# Patient Record
Sex: Female | Born: 1970 | Race: Black or African American | Hispanic: No | Marital: Single | Smoking: Never smoker
Health system: Southern US, Community
[De-identification: ages and names within clinical notes are randomized; demographics above are authoritative.]

## PROBLEM LIST (undated history)

## (undated) ENCOUNTER — Emergency Department (HOSPITAL_COMMUNITY): Payer: Medicaid Other | Source: Home / Self Care

## (undated) DIAGNOSIS — F32A Depression, unspecified: Secondary | ICD-10-CM

## (undated) DIAGNOSIS — E785 Hyperlipidemia, unspecified: Secondary | ICD-10-CM

## (undated) DIAGNOSIS — R4781 Slurred speech: Secondary | ICD-10-CM

## (undated) DIAGNOSIS — R252 Cramp and spasm: Secondary | ICD-10-CM

## (undated) DIAGNOSIS — K589 Irritable bowel syndrome without diarrhea: Secondary | ICD-10-CM

## (undated) DIAGNOSIS — E119 Type 2 diabetes mellitus without complications: Secondary | ICD-10-CM

## (undated) DIAGNOSIS — F419 Anxiety disorder, unspecified: Secondary | ICD-10-CM

## (undated) DIAGNOSIS — I639 Cerebral infarction, unspecified: Secondary | ICD-10-CM

## (undated) DIAGNOSIS — J45909 Unspecified asthma, uncomplicated: Secondary | ICD-10-CM

## (undated) DIAGNOSIS — F329 Major depressive disorder, single episode, unspecified: Secondary | ICD-10-CM

## (undated) HISTORY — DX: Hyperlipidemia, unspecified: E78.5

## (undated) HISTORY — PX: TOE SURGERY: SHX1073

## (undated) HISTORY — DX: Unspecified asthma, uncomplicated: J45.909

## (undated) HISTORY — DX: Slurred speech: R47.81

## (undated) HISTORY — DX: Cerebral infarction, unspecified: I63.9

## (undated) HISTORY — DX: Cramp and spasm: R25.2

## (undated) HISTORY — DX: Major depressive disorder, single episode, unspecified: F32.9

## (undated) HISTORY — DX: Anxiety disorder, unspecified: F41.9

## (undated) HISTORY — DX: Depression, unspecified: F32.A

## (undated) HISTORY — DX: Irritable bowel syndrome, unspecified: K58.9

---

## 2006-10-30 HISTORY — PX: ABDOMINAL HYSTERECTOMY: SHX81

## 2006-10-30 HISTORY — PX: OTHER SURGICAL HISTORY: SHX169

## 2013-10-01 ENCOUNTER — Emergency Department: Payer: Self-pay | Admitting: Emergency Medicine

## 2013-10-02 ENCOUNTER — Emergency Department: Payer: Self-pay | Admitting: Emergency Medicine

## 2014-09-27 ENCOUNTER — Ambulatory Visit (INDEPENDENT_AMBULATORY_CARE_PROVIDER_SITE_OTHER): Payer: No Typology Code available for payment source | Admitting: Primary Care

## 2014-09-27 ENCOUNTER — Encounter: Payer: Self-pay | Admitting: Primary Care

## 2014-09-27 VITALS — BP 128/88 | HR 55 | Temp 97.8°F | Ht 63.0 in | Wt 165.8 lb

## 2014-09-27 DIAGNOSIS — E785 Hyperlipidemia, unspecified: Secondary | ICD-10-CM

## 2014-09-27 DIAGNOSIS — G4700 Insomnia, unspecified: Secondary | ICD-10-CM | POA: Diagnosis not present

## 2014-09-27 DIAGNOSIS — R030 Elevated blood-pressure reading, without diagnosis of hypertension: Secondary | ICD-10-CM

## 2014-09-27 DIAGNOSIS — R079 Chest pain, unspecified: Secondary | ICD-10-CM | POA: Diagnosis not present

## 2014-09-27 DIAGNOSIS — K589 Irritable bowel syndrome without diarrhea: Secondary | ICD-10-CM | POA: Diagnosis not present

## 2014-09-27 MED ORDER — TRAZODONE HCL 50 MG PO TABS: ORAL_TABLET | ORAL | Status: DC

## 2014-09-27 NOTE — Progress Notes (Signed)
Pre visit review using our clinic review tool, if applicable. No additional management support is needed unless otherwise documented below in the visit note. 

## 2014-09-27 NOTE — Progress Notes (Signed)
Subjective:    Patient ID: Amber [name], female    DOB: Apr 25, 1971, 44 y.o.   MRN: [id number]  HPI  [name] is a 44 year old female who presents today to establish care and discuss the problems mentioned below. Will obtain old records.  1) Depression: History of. She staring seeing therapist in 2011 after losing her job. She feels it was very helpful in recovery and has felt well since. Denies SI/HI  2) Overweight: She realizes she is over weight and has started cutting back her soda intake. She is incorporating crystal light and water, and is down to 2 mini soda cans of mountain dew daily. Her diet is unhealthy and consists of fast food ([employer]), and eating out. She is currently not exercising.   Body mass index is 29.38 kg/(m^2).  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 She feels anxious about some of her patient cases which will prevent her from sleeping. She has a history of hair loss in the past. She denies worrying on a daily basis and does not feel anxious overall. She's been taking Melatonin 6 mg tablets for one month every night at bedtime and has lately been unable to fall asleep and stay asleep. Tries to get into the bed between 12:30am-1am and will wake up at 6:15am. She will fall asleep from 7:45am to 10:30am. Then will get home between 8:30-9pm. She's able to function. Denies irritability and anxiety symptoms. She's tried taking benadryl with help but doesn't like taking it routinely.  4) IBS (constipation): Present since hysterectomy. She had to endure a bowel resection due to severe endometriosis and had been following with GI regularily in [place]. She has not been re-evluated in several years and is requesting for referral to GI.  She's started taking stool softeners with some help. Diet does not consist of regular fiber intake.  5) High Blood Pressure: Elevated reading in Februrary 2016  at her GYN office. Dr. Elane Fritz at [facility]. She expereinced pre-eclampsia during labor with her daughter. She has not checked her BP since February 2016. Denies headaches today but will get them intermittently. She does not regularly check her BP.  BP Readings from Last 3 Encounters:  09/27/14 128/88    6) Hyperlipidemia: Measured in February 2016 LDL 162. Diet consists of fast food and eating out. She will cook some meals at home but does not include many fruits, vegetables, or sources of fiber.  7) Chest pain: Intermittent, located under left breast and left lateral chest wall. This has been present for about 6 months to one year. Now occuring more frequently. Tums will help occasionally. She describes her pain as tight and sharp that will last 3 minutes in duration. Recent labs completed in February. She does not believe she's ever had an ECG. Denies radiation of pain to arm or neck, diaphoresis, nausea/vomiting. Last occurrence of pain was several days ago.  Review of Systems  Constitutional: Negative for diaphoresis and unexpected weight change.  HENT: Negative for rhinorrhea.   Respiratory: Negative for cough and shortness of breath.   Cardiovascular: Positive for chest pain. Negative for palpitations.       See HPI  Gastrointestinal: Positive for constipation. Negative for nausea, vomiting and blood in stool.  Genitourinary: Negative for dysuria and frequency.  Musculoskeletal: Negative for myalgias and arthralgias.  Skin: Negative for rash.  Allergic/Immunologic: Positive for environmental allergies.  Neurological: Negative for dizziness  and headaches.  Hematological: Negative for adenopathy.  Psychiatric/Behavioral: Positive for sleep disturbance and agitation.       Past Medical History  Diagnosis Date  . Asthma   . Depression   . Headache   . Hyperlipidemia   . Hypertension   . UTI (lower urinary tract infection)   . IBS (irritable bowel syndrome)     History    Social History  . Marital Status: Married    Spouse Name: N/A  . Number of Children: N/A  . Years of Education: N/A   Occupational History  . Not on file.   Social History Main Topics  . Smoking status: Never Smoker   . Smokeless tobacco: Not on file  . Alcohol Use: No  . Drug Use: No  . Sexual Activity: Not on file   Other Topics Concern  . Not on file   Social History Narrative   Originally from [place]   Family lives up here in [place]   Has one daughter.   Enjoys spending time shopping and spending time with family        Past Surgical History  Procedure Laterality Date  . Abdominal hysterectomy  10/2006  . Toe surgery      Left 2nd metatarsal    Family History  Problem Relation Age of Onset  . Arthritis Father   . Diabetes Father   . Hypertension Father   . Hyperlipidemia Father   . Hyperlipidemia Mother     Allergies  Allergen Reactions  . Cephalexin Rash    No current outpatient prescriptions on file prior to visit.   No current facility-administered medications on file prior to visit.    BP 128/88 mmHg  Pulse 55  Temp(Src) 97.8 F (36.6 C) (Oral)  Ht 5\' 3"  (1.6 m)  Wt 165 lb 12.8 oz (75.206 kg)  BMI 29.38 kg/m2  SpO2 99%    Objective:   Physical Exam  Constitutional: She is oriented to person, place, and time. She appears well-developed.  HENT:  Right Ear: Tympanic membrane and ear canal normal.  Left Ear: Tympanic membrane and ear canal normal.  Nose: Nose normal.  Mouth/Throat: Oropharynx is clear and moist.  Eyes: Conjunctivae and EOM are normal. Pupils are equal, round, and reactive to light.  Neck: Neck supple. No thyromegaly present.  Cardiovascular: Normal rate, regular rhythm, normal heart sounds and intact distal pulses.   No murmur heard. Pulmonary/Chest: Effort normal and breath sounds normal.  Abdominal: Soft. Bowel sounds are normal.  Musculoskeletal: Normal range of motion.  Lymphadenopathy:    She  has no cervical adenopathy.  Neurological: She is alert and oriented to person, place, and time. She has normal reflexes. No cranial nerve deficit.  Skin: Skin is warm and dry.  Psychiatric: She has a normal mood and affect.          Assessment & Plan:

## 2014-09-27 NOTE — Patient Instructions (Addendum)
Continue your efforts to improve your diet. Please limit carbohydrates in the form of white bread, rice, pasta, cakes, cookies, sugary drinks, etc. Increase your consumption of fresh fruits and vegetables. Be sure to drink plenty of water daily. Try taking Melatonin 4 hours prior to bedtime for sleep. You may also take 1/2 tablet of the Trazodone at bedtime as needed for sleep.  Your ECG did not show any abnormality. Please let me know if your pain worsens or if you develop chest pain with radiating pain down your arm or up your neck with nausea and sweating. It was a pleasure to meet you today! Please don't hesitate to call me with any questions. Welcome to [facility]!

## 2014-09-29 DIAGNOSIS — I10 Essential (primary) hypertension: Secondary | ICD-10-CM | POA: Insufficient documentation

## 2014-09-29 DIAGNOSIS — R079 Chest pain, unspecified: Secondary | ICD-10-CM | POA: Insufficient documentation

## 2014-09-29 DIAGNOSIS — E1169 Type 2 diabetes mellitus with other specified complication: Secondary | ICD-10-CM | POA: Insufficient documentation

## 2014-09-29 DIAGNOSIS — R072 Precordial pain: Secondary | ICD-10-CM

## 2014-09-29 DIAGNOSIS — K589 Irritable bowel syndrome without diarrhea: Secondary | ICD-10-CM | POA: Insufficient documentation

## 2014-09-29 DIAGNOSIS — G4700 Insomnia, unspecified: Secondary | ICD-10-CM | POA: Insufficient documentation

## 2014-09-29 DIAGNOSIS — E785 Hyperlipidemia, unspecified: Secondary | ICD-10-CM | POA: Insufficient documentation

## 2014-09-29 HISTORY — DX: Precordial pain: R07.2

## 2014-09-29 NOTE — Assessment & Plan Note (Signed)
Reports elevated reading in February during GYN visit. Does not regularly check BP and has not since. BP stable in office today. Will continue to monitor.  BP: 128/88 mmHg

## 2014-09-29 NOTE — Assessment & Plan Note (Signed)
Labs last obtained in February 2016 from GYN in [place]. LDL 162 Unhealthy diet, but is working to reduce her intake of sodas. Discussed the importance of a healthy diet and exercise. Will recheck in 6 months.

## 2014-09-29 NOTE — Assessment & Plan Note (Signed)
Intermittent, left sided. Suspect MSK related. ECG unremarkable without t-wave changes or ST elevation. Normal sinus rhythm. Recommended ibuprofen for pain/inflammation during episode. Educated patient on s/s of myocardial infarction and instructed her to call 911 or be evaluated if she ever experienced those symptoms. She verbalized understanding.

## 2014-09-29 NOTE — Assessment & Plan Note (Signed)
Constipation type. Once followed with GI when living in [facility], would like referral to manage with GI in [place]. Referral made.

## 2014-09-29 NOTE — Assessment & Plan Note (Signed)
Does not fit diagnosis for generalized anxiety disorder. Suggested she develop a bedtime routine without her computer, and to take Melatonin 4 hours prior to sleep. RX for Trazodone 1/2 tablet one hour to sleep if other measures are not helpful.

## 2014-10-01 ENCOUNTER — Ambulatory Visit: Payer: Self-pay | Admitting: Internal Medicine

## 2014-10-01 ENCOUNTER — Telehealth: Payer: Self-pay | Admitting: *Deleted

## 2014-10-01 ENCOUNTER — Telehealth: Payer: Self-pay | Admitting: Primary Care

## 2014-10-01 ENCOUNTER — Ambulatory Visit: Payer: Self-pay | Admitting: Primary Care

## 2014-10-01 NOTE — Telephone Encounter (Signed)
Patient Name: Amber [name]  DOB: 1971/02/20    Initial Comment Caller states she has bad muscle spasms    Nurse Assessment  Nurse: Sherilyn Cooter, RN, Thurmond Butts Date/Time (Eastern Time): 10/01/2014 11:58:41 AM  Confirm and document reason for call. If symptomatic, describe symptoms. ---Caller states that she is having some bad upper abdominal spasms which began 2 months. She spasms are happening more frequently. She had an EKG done on Friday due to them happening more frequently. The EKG did not show a problem. She rates the pain as 8 on 0-10 scale. She is having a slight one now. She states that they usually last about 2 minutes. They are happening more frequently and increasing in their severity.  Has the patient traveled out of the country within the last 30 days? ---No  Does the patient require triage? ---Yes  Related visit to physician within the last 2 weeks? ---Yes  Does the PT have any chronic conditions? (i.e. diabetes, asthma, etc.) ---Yes  List chronic conditions. ---Asthma, Hypercholesterolemia  Did the patient indicate they were pregnant? ---No     Guidelines    Guideline Title Affirmed Question Affirmed Notes  Abdominal Pain - Upper [1] MODERATE pain (e.g., interferes with normal activities) AND [2] comes and goes (cramps) AND [3] present > 24 hours (Exception: pain with Vomiting or Diarrhea - see that Guideline)    Final Disposition User   See Physician within 24 Hours Sherilyn Cooter, RN, Thurmond Butts    Comments  Caller asked for a later appointment as she has to pick up her daughter at school and take her to get her allergy shots. I scheduled an appointment with Nicki Reaper NP for 4:15pm.

## 2014-10-01 NOTE — Telephone Encounter (Signed)
Patient left a voicemail stating that she is having a lot of muscles spasms and wants to know if you will call a muscle relaxer in for her? Patient had an appointment scheduled this afternoon with Nicki Reaper NP, but cancelled appointment.  Pharmacy [facility]

## 2014-10-01 NOTE — Telephone Encounter (Signed)
Spoke with patient regarding symptoms. This sounds like it could be costochondritis. Suggested taking Naproxen OTC and to callback tomorrow with an update. She verbalized understanding.

## 2014-12-22 ENCOUNTER — Other Ambulatory Visit: Payer: Self-pay | Admitting: Primary Care

## 2014-12-23 NOTE — Telephone Encounter (Signed)
Refill Request. Last prescribed on 09/27/14  trazodone  50 MG tablet  Dispense: 30 tablet   Refills: 1       Last seen on 09/27/14. No future apt.

## 2015-03-13 DIAGNOSIS — I639 Cerebral infarction, unspecified: Secondary | ICD-10-CM

## 2015-03-13 HISTORY — DX: Cerebral infarction, unspecified: I63.9

## 2015-03-14 ENCOUNTER — Inpatient Hospital Stay (HOSPITAL_COMMUNITY): Payer: Medicaid Other

## 2015-03-14 ENCOUNTER — Emergency Department (HOSPITAL_COMMUNITY): Payer: Medicaid Other

## 2015-03-14 ENCOUNTER — Encounter (HOSPITAL_COMMUNITY): Payer: Self-pay | Admitting: *Deleted

## 2015-03-14 ENCOUNTER — Inpatient Hospital Stay (HOSPITAL_COMMUNITY)
Admission: EM | Admit: 2015-03-14 | Discharge: 2015-03-17 | DRG: 066 | Disposition: A | Discharge status: Home / Self Care | Payer: Medicaid Other | Attending: Internal Medicine | Admitting: Internal Medicine

## 2015-03-14 DIAGNOSIS — R471 Dysarthria and anarthria: Secondary | ICD-10-CM

## 2015-03-14 DIAGNOSIS — G4700 Insomnia, unspecified: Secondary | ICD-10-CM | POA: Diagnosis not present

## 2015-03-14 DIAGNOSIS — I6789 Other cerebrovascular disease: Secondary | ICD-10-CM

## 2015-03-14 DIAGNOSIS — E1169 Type 2 diabetes mellitus with other specified complication: Secondary | ICD-10-CM | POA: Diagnosis present

## 2015-03-14 DIAGNOSIS — I63412 Cerebral infarction due to embolism of left middle cerebral artery: Secondary | ICD-10-CM | POA: Diagnosis not present

## 2015-03-14 DIAGNOSIS — R4781 Slurred speech: Secondary | ICD-10-CM | POA: Diagnosis present

## 2015-03-14 DIAGNOSIS — E785 Hyperlipidemia, unspecified: Secondary | ICD-10-CM | POA: Diagnosis present

## 2015-03-14 DIAGNOSIS — I10 Essential (primary) hypertension: Secondary | ICD-10-CM | POA: Diagnosis present

## 2015-03-14 DIAGNOSIS — I6340 Cerebral infarction due to embolism of unspecified cerebral artery: Secondary | ICD-10-CM | POA: Diagnosis present

## 2015-03-14 DIAGNOSIS — F329 Major depressive disorder, single episode, unspecified: Secondary | ICD-10-CM | POA: Diagnosis present

## 2015-03-14 DIAGNOSIS — E875 Hyperkalemia: Secondary | ICD-10-CM | POA: Diagnosis not present

## 2015-03-14 DIAGNOSIS — R29810 Facial weakness: Secondary | ICD-10-CM | POA: Diagnosis present

## 2015-03-14 DIAGNOSIS — R4701 Aphasia: Secondary | ICD-10-CM | POA: Diagnosis present

## 2015-03-14 DIAGNOSIS — I639 Cerebral infarction, unspecified: Secondary | ICD-10-CM

## 2015-03-14 DIAGNOSIS — Z79899 Other long term (current) drug therapy: Secondary | ICD-10-CM

## 2015-03-14 DIAGNOSIS — K5900 Constipation, unspecified: Secondary | ICD-10-CM | POA: Diagnosis not present

## 2015-03-14 DIAGNOSIS — Z8673 Personal history of transient ischemic attack (TIA), and cerebral infarction without residual deficits: Secondary | ICD-10-CM | POA: Diagnosis present

## 2015-03-14 LAB — CREATININE, SERUM
Creatinine, Ser: 0.98 mg/dL (ref 0.44–1.00)
GFR calc Af Amer: >60 mL/min
GFR calc non Af Amer: >60 mL/min

## 2015-03-14 LAB — COMPREHENSIVE METABOLIC PANEL
ALBUMIN: 4 g/dL (ref 3.5–5.0)
ALT: 18 U/L (ref 14–54)
ANION GAP: 12 (ref 5–15)
AST: 17 U/L (ref 15–41)
Alkaline Phosphatase: 59 U/L (ref 38–126)
BUN: 16 mg/dL (ref 6–20)
CHLORIDE: 104 mmol/L (ref 101–111)
CO2: 24 mmol/L (ref 22–32)
Calcium: 9.6 mg/dL (ref 8.9–10.3)
Creatinine, Ser: 1.06 mg/dL — ABNORMAL HIGH (ref 0.44–1.00)
GFR calc Af Amer: >60 mL/min (ref >60)
GFR calc non Af Amer: >60 mL/min (ref >60)
Glucose, Bld: 117 mg/dL — ABNORMAL HIGH (ref 65–99)
POTASSIUM: 3.6 mmol/L (ref 3.5–5.1)
SODIUM: 140 mmol/L (ref 135–145)
TOTAL PROTEIN: 7.2 g/dL (ref 6.5–8.1)
Total Bilirubin: 0.5 mg/dL (ref 0.3–1.2)

## 2015-03-14 LAB — DIFFERENTIAL
BASOS PCT: 1 %
Basophils Absolute: 0 10*3/uL (ref 0.0–0.1)
EOS ABS: 0.1 10*3/uL (ref 0.0–0.7)
EOS PCT: 2 %
Lymphocytes Relative: 57 %
Lymphs Abs: 3.4 10*3/uL (ref 0.7–4.0)
Monocytes Absolute: 0.4 10*3/uL (ref 0.1–1.0)
Monocytes Relative: 7 %
NEUTROS PCT: 33 %
Neutro Abs: 1.9 10*3/uL (ref 1.7–7.7)

## 2015-03-14 LAB — I-STAT CHEM 8, ED
BUN: 18 mg/dL (ref 6–20)
Calcium, Ion: 1.11 mmol/L — ABNORMAL LOW (ref 1.12–1.23)
Chloride: 105 mmol/L (ref 101–111)
Creatinine, Ser: 1.1 mg/dL — ABNORMAL HIGH (ref 0.44–1.00)
Glucose, Bld: 117 mg/dL — ABNORMAL HIGH (ref 65–99)
HEMATOCRIT: 47 % — AB (ref 36.0–46.0)
HEMOGLOBIN: 16 g/dL — AB (ref 12.0–15.0)
POTASSIUM: 3.5 mmol/L (ref 3.5–5.1)
SODIUM: 141 mmol/L (ref 135–145)
TCO2: 25 mmol/L (ref 0–100)

## 2015-03-14 LAB — CBC
HCT: 44.2 % (ref 36.0–46.0)
HCT: 42.9 % (ref 36.0–46.0)
Hemoglobin: 14.5 g/dL (ref 12.0–15.0)
Hemoglobin: 14 g/dL (ref 12.0–15.0)
MCH: 28.5 pg (ref 26.0–34.0)
MCH: 28.4 pg (ref 26.0–34.0)
MCHC: 32.8 g/dL (ref 30.0–36.0)
MCHC: 32.6 g/dL (ref 30.0–36.0)
MCV: 87 fL (ref 78.0–100.0)
MCV: 87 fL (ref 78.0–100.0)
PLATELETS: 207 10*3/uL (ref 150–400)
PLATELETS: 215 10*3/uL (ref 150–400)
RBC: 5.08 MIL/uL (ref 3.87–5.11)
RBC: 4.93 MIL/uL (ref 3.87–5.11)
RDW: 12.8 % (ref 11.5–15.5)
RDW: 12.9 % (ref 11.5–15.5)
WBC: 5.8 10*3/uL (ref 4.0–10.5)
WBC: 5.4 10*3/uL (ref 4.0–10.5)

## 2015-03-14 LAB — URINALYSIS, ROUTINE W REFLEX MICROSCOPIC
Bilirubin Urine: NEGATIVE
GLUCOSE, UA: NEGATIVE mg/dL
Hgb urine dipstick: NEGATIVE
KETONES UR: NEGATIVE mg/dL
NITRITE: NEGATIVE
PH: 6 (ref 5.0–8.0)
PROTEIN: NEGATIVE mg/dL
Specific Gravity, Urine: 1.005 (ref 1.005–1.030)
Urobilinogen, UA: 0.2 mg/dL (ref 0.0–1.0)

## 2015-03-14 LAB — LIPID PANEL
Cholesterol: 265 mg/dL — ABNORMAL HIGH (ref 0–200)
HDL: 81 mg/dL
LDL Cholesterol: 170 mg/dL — ABNORMAL HIGH (ref 0–99)
Total CHOL/HDL Ratio: 3.3 ratio
Triglycerides: 71 mg/dL
VLDL: 14 mg/dL (ref 0–40)

## 2015-03-14 LAB — APTT: aPTT: 29 seconds (ref 24–37)

## 2015-03-14 LAB — PROTIME-INR
INR: 0.99 (ref 0.00–1.49)
PROTHROMBIN TIME: 13.3 s (ref 11.6–15.2)

## 2015-03-14 LAB — RAPID URINE DRUG SCREEN, HOSP PERFORMED
Amphetamines: NOT DETECTED
BARBITURATES: NOT DETECTED
BENZODIAZEPINES: NOT DETECTED
COCAINE: NOT DETECTED
Opiates: NOT DETECTED
Tetrahydrocannabinol: NOT DETECTED

## 2015-03-14 LAB — URINE MICROSCOPIC-ADD ON

## 2015-03-14 LAB — I-STAT TROPONIN, ED: TROPONIN I, POC: 0 ng/mL (ref 0.00–0.08)

## 2015-03-14 LAB — ETHANOL: Alcohol, Ethyl (B): <5 mg/dL (ref <5)

## 2015-03-14 MED ORDER — ASPIRIN 325 MG PO TABS
325.0000 mg | ORAL_TABLET | Freq: Every day | ORAL | Status: DC
  Administered 2015-03-14 – 2015-03-17 (×4): 325 mg via ORAL
  Filled 2015-03-14 (×4): qty 1

## 2015-03-14 MED ORDER — STROKE: EARLY STAGES OF RECOVERY BOOK
Freq: Once | Status: AC
  Administered 2015-03-14: 10:00:00

## 2015-03-14 MED ORDER — IOHEXOL 350 MG/ML SOLN
100.0000 mL | Freq: Once | INTRAVENOUS | Status: AC | PRN
  Administered 2015-03-14: 80 mL via INTRAVENOUS

## 2015-03-14 MED ORDER — ASPIRIN 300 MG RE SUPP: 300.0000 mg | Freq: Every day | RECTAL | Status: DC

## 2015-03-14 MED ORDER — ACETAMINOPHEN 325 MG PO TABS
650.0000 mg | ORAL_TABLET | ORAL | Status: DC | PRN
  Administered 2015-03-15 – 2015-03-16 (×3): 650 mg via ORAL
  Filled 2015-03-14 (×3): qty 2

## 2015-03-14 MED ORDER — ENOXAPARIN SODIUM 40 MG/0.4ML ~~LOC~~ SOLN
40.0000 mg | Freq: Every day | SUBCUTANEOUS | Status: DC
  Administered 2015-03-14 – 2015-03-17 (×4): 40 mg via SUBCUTANEOUS
  Filled 2015-03-14 (×4): qty 0.4

## 2015-03-14 MED ORDER — LORAZEPAM 2 MG/ML IJ SOLN
INTRAMUSCULAR | Status: AC
  Filled 2015-03-14: qty 1

## 2015-03-14 MED ORDER — SODIUM CHLORIDE 0.9 % IV BOLUS (SEPSIS)
1000.0000 mL | Freq: Once | INTRAVENOUS | Status: AC
  Administered 2015-03-14: 1000 mL via INTRAVENOUS

## 2015-03-14 MED ORDER — ATORVASTATIN CALCIUM 40 MG PO TABS
40.0000 mg | ORAL_TABLET | Freq: Every day | ORAL | Status: DC
  Administered 2015-03-14 – 2015-03-17 (×4): 40 mg via ORAL
  Filled 2015-03-14 (×4): qty 1

## 2015-03-14 MED ORDER — HYDROCHLOROTHIAZIDE 12.5 MG PO CAPS: 12.5000 mg | ORAL_CAPSULE | Freq: Every day | ORAL | Status: DC

## 2015-03-14 MED ORDER — ACETAMINOPHEN 650 MG RE SUPP: 650.0000 mg | RECTAL | Status: DC | PRN

## 2015-03-14 MED ORDER — LORAZEPAM 2 MG/ML IJ SOLN
1.0000 mg | Freq: Once | INTRAMUSCULAR | Status: AC
  Administered 2015-03-14: 1 mg via INTRAVENOUS

## 2015-03-14 NOTE — Procedures (Signed)
[facility]  [address]  [place]. [place] [address].  [phone]   TRANSCRANIAL DOPPLER BUBBLE STUDY  Amber [name]  Date of Birth: June 25, 1970 Medical Record Number: [id number] Indications: embolic stroke Date of Procedure: 03/14/2015 Clinical History: embolic stroke Technical Description: Transcranial Doppler Bubble Study was performed at the bedside after taking written informed consent from the patient and explaining risk/benefits. The right middle cerebral artery was insonated using a hand held probe. And IV line had been previously inserted in the left forearm by the RN using aseptic precautions. Agitated saline injection at rest and after valsalva maneuver did not result in few high intensity transient signals (HITS).  Impression: negative Transcranial Doppler Bubble Study indicative of no right to left shunt   Results were explained to the patient. Questions were answered.

## 2015-03-14 NOTE — Progress Notes (Signed)
STROKE TEAM PROGRESS NOTE   SUBJECTIVE (INTERVAL HISTORY) Her mom and sister are at the bedside.  Overall she feels her condition is stable. Still has mild word finding difficulties, no weakness.    OBJECTIVE Temp:  [97.9 F (36.6 C)-98.8 F (37.1 C)] 98.1 F (36.7 C) (10/14 1617) Pulse Rate:  [71-92] 71 (10/14 1617) Cardiac Rhythm:  [-] Normal sinus rhythm (10/14 0712) Resp:  [17-23] 18 (10/14 1617) BP: (125-165)/(71-109) 138/96 mmHg (10/14 1617) SpO2:  [97 %-100 %] 99 % (10/14 1617) Weight:  [163 lb 2.3 oz (74 kg)] 163 lb 2.3 oz (74 kg) (10/14 0600)  No results for input(s): GLUCAP in the last 168 hours.  Recent Labs Lab 03/14/15 0106 03/14/15 0113 03/14/15 0930  NA 140 141  --   K 3.6 3.5  --   CL 104 105  --   CO2 24  --   --   GLUCOSE 117* 117*  --   BUN 16 18  --   CREATININE 1.06* 1.10* 0.98  CALCIUM 9.6  --   --     Recent Labs Lab 03/14/15 0106  AST 17  ALT 18  ALKPHOS 59  BILITOT 0.5  PROT 7.2  ALBUMIN 4.0    Recent Labs Lab 03/14/15 0106 03/14/15 0113 03/14/15 0930  WBC 5.8  --  5.4  NEUTROABS 1.9  --   --   HGB 14.5 16.0* 14.0  HCT 44.2 47.0* 42.9  MCV 87.0  --  87.0  PLT 207  --  215   No results for input(s): CKTOTAL, CKMB, CKMBINDEX, TROPONINI in the last 168 hours.  Recent Labs  03/14/15 0106  LABPROT 13.3  INR 0.99    Recent Labs  03/14/15 0138  COLORURINE YELLOW  LABSPEC 1.005  PHURINE 6.0  GLUCOSEU NEGATIVE  HGBUR NEGATIVE  BILIRUBINUR NEGATIVE  KETONESUR NEGATIVE  PROTEINUR NEGATIVE  UROBILINOGEN 0.2  NITRITE NEGATIVE  LEUKOCYTESUR TRACE*       Component Value Date/Time   CHOL 265* 03/14/2015 0930   TRIG 71 03/14/2015 0930   HDL 81 03/14/2015 0930   CHOLHDL 3.3 03/14/2015 0930   VLDL 14 03/14/2015 0930   LDLCALC 170* 03/14/2015 0930   No results found for: HGBA1C    Component Value Date/Time   LABOPIA NONE DETECTED 03/14/2015 0138   COCAINSCRNUR NONE DETECTED 03/14/2015 0138   LABBENZ NONE DETECTED  03/14/2015 0138   AMPHETMU NONE DETECTED 03/14/2015 0138   THCU NONE DETECTED 03/14/2015 0138   LABBARB NONE DETECTED 03/14/2015 0138     Recent Labs Lab 03/14/15 0106  ETH <5    I have personally reviewed the radiological images below and agree with the radiology interpretations.  Ct Angio Head W/cm &/or Wo Cm  03/14/2015  IMPRESSION: Negative CTA of the head and neck.    Ct Head Wo Contrast  03/14/2015  IMPRESSION: Negative noncontrast CT appearance of the brain.    Mr Brain Wo Contrast  03/14/2015  IMPRESSION: 1. Acute ischemic nonhemorrhagic linear infarct involving the posterior left frontal lobe cortical gray matter and subcortical white matter. No associated mass effect. 2. Small remote right parietal cortical infarct with additional probable tiny remote left cerebellar infarcts. 3. Mild chronic small vessel ischemic disease.   2D Echocardiogram  - Left ventricle: The cavity size was normal. Systolic function was normal. The estimated ejection fraction was 50%. Wall motion was normal; there were no regional wall motion abnormalities. Doppler parameters are consistent with abnormal left ventricular relaxation (grade 1 diastolic dysfunction).  Doppler parameters are consistent with elevated ventricular end-diastolic filling pressure. - Aortic valve: Structurally normal valve. There was no regurgitation. - Mitral valve: Structurally normal valve. There was mild regurgitation. - Left atrium: The atrium was normal in size. - Right ventricle: The cavity size was normal. Wall thickness was normal. Systolic function was normal. - Right atrium: The atrium was normal in size. - Tricuspid valve: There was mild regurgitation. - Pulmonic valve: There was trivial regurgitation. - Inferior vena cava: The vessel was normal in size. The respirophasic diameter changes were in the normal range (>= 50%), consistent with normal central venous pressure. -  Pericardium, extracardiac: The pericardium was normal in appearance. Impressions: - LVEF is mildly impaired with diffuse hypokinesis. Abnormal relaxation with mildly elevated filling pressures. Mild MR and TR.  TCD bubble study - negative for PFO  Hypercoagulable and autoimmune work up - pending  PHYSICAL EXAM  Temp:  [97.9 F (36.6 C)-98.8 F (37.1 C)] 98.1 F (36.7 C) (10/14 1617) Pulse Rate:  [71-92] 71 (10/14 1617) Resp:  [17-23] 18 (10/14 1617) BP: (125-165)/(71-109) 138/96 mmHg (10/14 1617) SpO2:  [97 %-100 %] 99 % (10/14 1617) Weight:  [163 lb 2.3 oz (74 kg)] 163 lb 2.3 oz (74 kg) (10/14 0600)  General - Well nourished, well developed, mild lethargy.  Ophthalmologic - Sharp disc margins OU.  Cardiovascular - Regular rate and rhythm with no murmur.  Neck - supple, no carotid bruits  Mental Status -  Level of arousal and orientation to time, place, and person were intact. Language exam showed mild expressive aphasia, and mild deficit with repetition, but intact with naming and comprehension. Fund of Knowledge was assessed and was intact.  Cranial Nerves II - XII - II - Visual field intact OU. III, IV, VI - Extraocular movements intact. V - Facial sensation intact bilaterally. VII - mild right nasolabial fold flattening. VIII - Hearing & vestibular intact bilaterally. X - Palate elevates symmetrically. XI - Chin turning & shoulder shrug intact bilaterally. XII - Tongue protrusion intact.  Motor Strength - The patient's strength was normal in all extremities and pronator drift was absent.  Bulk was normal and fasciculations were absent.   Motor Tone - Muscle tone was assessed at the neck and appendages and was normal.  Reflexes - The patient's reflexes were symmetrical in all extremities and she had no pathological reflexes.  Sensory - Light touch, temperature/pinprick were assessed and were symmetrical.    Coordination - The patient had normal movements  in the hands with no ataxia or dysmetria.  Tremor was absent.  Gait and Station - The patient's transfers, posture, gait, station, and turns were observed as normal.   ASSESSMENT/PLAN Amber [name] is a 44 y.o. female with history of HTN, HLD admitted for word finding difficulties. Symptoms improving. Pt denies OCP use or smoking hx.   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   Patient counseled to be compliant with her antithrombotic medications  Ongoing aggressive stroke risk factor management  Hypertension  Home meds:   none Permissive hypertension (OK if <220/120) for 24-48 hours post stroke and then gradually normalized within 5-7 days. Currently on none  Stable  Hyperlipidemia  Home meds:  none   Currently on lipitor 40  LDL 170, goal < 70  Continue statin at discharge  Other Stroke Risk Factors    Other Active Problems  No OCP use  No hx of smoking  Other Pertinent History    Hospital day # 0   Marvel Plan, MD PhD Stroke Neurology 03/14/2015 5:02 PM    To contact Stroke Continuity provider, please refer to [email or web address]. After hours, contact General Neurology

## 2015-03-14 NOTE — Evaluation (Addendum)
Physical Therapy Evaluation Patient Details Name: Amber [name] MRN: [id number] DOB: [birth year] Today's Date: 03/14/2015   History of Present Illness  44 y.o. female admitted to [facility] on 03/14/15 with slurred speech and left facial droop.  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  Pt with significant PMHx of asthma, depression, HA (migraines), HTN, and left second metatarsal surgery.   Clinical Impression  Pt is mobilizing well, although very hesitant with her gait, she feels "different".  She doesn't trust her left side.  She feels some functional weakness despite having 5/5 one rep success with seated MMT.  She would benefit from being followed acutely for gait, stair, and balance training and if she does well acutely she may not need f/u at discharge.  If she left today, I would recommend OP PT for balance and gait training.   Follow Up Recommendations Outpatient PT;Supervision - Intermittent (for balance and gait training if needed- re assess Monday)    Equipment Recommendations  None recommended by PT    Recommendations for Other Services   NA    Precautions / Restrictions   NA      Mobility  Bed Mobility Overal bed mobility: Modified Independent                Transfers Overall transfer level: Needs assistance   Transfers: Sit to/from Stand Sit to Stand: Supervision         General transfer comment: supervision for safety due to slow speed of transition.    Ambulation/Gait Ambulation/Gait assistance: Supervision Ambulation Distance (Feet): 200 Feet Assistive device: None Gait Pattern/deviations: Step-through pattern;Narrow base of support;Shuffle Gait velocity: decreased Gait velocity interpretation: Below normal speed for age/gender General Gait Details: Pt with slow gait speed, cautious pattern.  She reports she doesn't feel steady and she doesn't trust her left  side (no signs of buckling, fucntional weakness on her left).    Stairs Stairs: Yes Stairs assistance: Supervision Stair Management: One rail Right;One rail Left;Alternating pattern;Step to pattern;Forwards Number of Stairs: 5 (x2) General stair comments: Pt did stairs with and without rails, step to and alternating pattern, slow, cautious, but able to do with supervision.       Modified Rankin (Stroke Patients Only) Modified Rankin (Stroke Patients Only) Pre-Morbid Rankin Score: No symptoms Modified Rankin: Moderately severe disability     Balance Overall balance assessment: Needs assistance Sitting-balance support: Feet supported;No upper extremity supported Sitting balance-Leahy Scale: Good     Standing balance support: Single extremity supported;Bilateral upper extremity supported;No upper extremity supported Standing balance-Leahy Scale: Good                               Pertinent Vitals/Pain Pain Assessment: No/denies pain    Home Living Family/patient expects to be discharged to:: Private residence Living Arrangements: Children;Other relatives (75 y.o. 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   Dominant Hand: Right    Extremity/Trunk Assessment   Upper Extremity Assessment: Defer to OT evaluation           Lower Extremity Assessment: Overall WFL for tasks assessed (5/5 seated  MMT, corrdination sensation WNL)      Cervical / Trunk Assessment: Normal  Communication   Communication: Expressive difficulties  Cognition Arousal/Alertness: Awake/alert Behavior During Therapy: WFL for tasks assessed/performed Overall Cognitive Status: Within Functional Limits for tasks assessed                       General Comments General comments (skin integrity, edema, etc.): Pt was able, with close supervision to pick up an object from the floor, slowly, cautiously.           Assessment/Plan    PT Assessment Patient needs continued PT services  PT Diagnosis Difficulty walking;Abnormality of gait;Generalized weakness   PT Problem List Decreased strength;Decreased activity tolerance;Decreased balance;Decreased mobility;Decreased knowledge of use of DME  PT Treatment Interventions Gait training;Stair training;Functional mobility training;Therapeutic activities;Therapeutic exercise;Balance training;Neuromuscular re-education;Patient/family education   PT Goals (Current goals can be found in the Care Plan section) Acute Rehab PT Goals Patient Stated Goal: to get back to normal and figure out what she has to do to not have a stroke again PT Goal Formulation: With patient Time For Goal Achievement: 03/28/15 Potential to Achieve Goals: Good    Frequency Min 4X/week           End of Session   Activity Tolerance: Patient tolerated treatment well Patient left: in bed;with call bell/phone within reach;with family/visitor present Nurse Communication: Mobility status         Time: 7096-4383 PT Time Calculation (min) (ACUTE ONLY): 29 min   Charges:   PT Evaluation $Initial PT Evaluation Tier I: 1 Procedure PT Treatments $Gait Training: 8-22 mins        Johnette Teigen B. Anilah Huck, PT, DPT [phone]   03/14/2015, 4:41 PM

## 2015-03-14 NOTE — Evaluation (Signed)
Speech Language Pathology Evaluation Patient Details Name: Amber [name] MRN: [id number] DOB: Oct 18, 1970 Today's Date: 03/14/2015 Time:  -     Problem List:  Patient Active Problem List   Diagnosis Date Noted  . Acute ischemic stroke (HCC) 03/14/2015  . Insomnia 09/29/2014  . IBS (irritable bowel syndrome) 09/29/2014  . Hyperlipidemia 09/29/2014  . Essential hypertension 09/29/2014   Past Medical History:  Past Medical History  Diagnosis Date  . Asthma   . Depression   . Headache   . Hyperlipidemia   . Hypertension   . UTI (lower urinary tract infection)   . IBS (irritable bowel syndrome)   . Migraine    Past Surgical History:  Past Surgical History  Procedure Laterality Date  . Abdominal hysterectomy  10/2006  . Toe surgery      Left 2nd metatarsal  . Cesarean section  2005   HPI:  44 y.o. female with a past medical history significant for hyperlipidemia, history of preeclampsia, and migraines who presents with slurred speech.  MRI showed Acute ischemic nonhemorrhagic linear infarct involving the posterior left frontal lobe cortical gray matter and subcortical white matter.  Small remote right parietal cortical infarct with additional probable tiny remote left cerebellar infarcts.  Assessment / Plan / Recommendation Clinical Impression  Pt presents with a mild-moderate neurogenic dysfluency and mild dysarthria.  Language is intact with normal comprehension; good grammatical form; no aphasia.  Pt's output is slow and deliberate with initial sound repetitions and articulatory distortions.  Pt frustrated by speech, but reports improvements since last night.  Recommend OP SLP intervention at D/C.     SLP Assessment  Patient needs continued Speech Lanaguage Pathology Services    Follow Up Recommendations  Outpatient SLP    Frequency and Duration min 2x/week  1 week   Pertinent Vitals/Pain Pain Assessment: No/denies pain   SLP Goals  Potential to Achieve Goals  (ACUTE ONLY): Good  SLP Evaluation Prior Functioning  Cognitive/Linguistic Baseline: Within functional limits  Lives With: Daughter;Family (lives with cousin and eleven yr old dtr) Vocation: Full time employment   Cognition  Overall Cognitive Status: Within Functional Limits for tasks assessed Orientation Level: Oriented X4    Comprehension  Auditory Comprehension Overall Auditory Comprehension: Appears within functional limits for tasks assessed Yes/No Questions: Within Functional Limits Commands: Within Functional Limits Conversation: Complex Visual Recognition/Discrimination Discrimination: Within Function Limits Reading Comprehension Reading Status: Within funtional limits    Expression Expression Primary Mode of Expression: Verbal Verbal Expression Overall Verbal Expression: Appears within functional limits for tasks assessed Level of Generative/Spontaneous Verbalization: Conversation Repetition: No impairment Naming: No impairment Written Expression Written Expression: Not tested   Oral / Motor Motor Speech Overall Motor Speech: Impaired Respiration: Within functional limits Phonation: Normal Resonance: Within functional limits Articulation: Impaired Level of Impairment: Word Intelligibility: Intelligibility reduced Phrase: 75-100% accurate Sentence: 75-100% accurate Motor Planning: Impaired Level of Impairment: Phrase Motor Speech Errors:  (dysfluency)   GO    Rondarius Kadrmas L. Samson Frederic, [place] CCC/SLP Pager [phone]  Blenda Mounts Laurice 03/14/2015, 2:14 PM

## 2015-03-14 NOTE — ED Notes (Signed)
Attempted report 

## 2015-03-14 NOTE — Progress Notes (Signed)
  Echocardiogram 2D Echocardiogram has been performed.  Arvil Chaco 03/14/2015, 12:28 PM

## 2015-03-14 NOTE — Progress Notes (Signed)
*  PRELIMINARY RESULTS* Vascular Ultrasound Transcranial Doppler with Bubbles has been completed by Dr. Roda Shutters. There is no obvious evidence of PFO at rest or with valsalva maneuver.  03/14/2015 2:53 PM Gertie Fey, RVT, RDCS, RDMS

## 2015-03-14 NOTE — Progress Notes (Signed)
  Echocardiogram 2D Echocardiogram has been performed.  [name], Amber Dicarlo 03/14/2015, 12:28 PM 

## 2015-03-14 NOTE — Care Management Note (Addendum)
Case Management Note  Patient Details  Name: Alfie Svetlik MRN: [id number] Date of Birth: 1971-01-29  Subjective/Objective:                    Action/Plan: Patient admitted with CVA. Pt is from home with family. Awaiting PT/OT recommendations for discharge disposition and TEE on Monday. Patient does not have insurance and is being followed by Lanora Manis in financial counseling.  CM will continue to follow for discharge needs.   Expected Discharge Date:                  Expected Discharge Plan:  Home/Self Care  In-House Referral:     Discharge planning Services     Post Acute Care Choice:    Choice offered to:     DME Arranged:    DME Agency:     HH Arranged:    HH Agency:     Status of Service:  In process, will continue to follow  Medicare Important Message Given:    Date Medicare IM Given:    Medicare IM give by:    Date Additional Medicare IM Given:    Additional Medicare Important Message give by:     If discussed at Long Length of Stay Meetings, dates discussed:    Additional Comments:  Kermit Balo, RN 03/14/2015, 3:39 PM

## 2015-03-14 NOTE — Progress Notes (Signed)
Utilization review completed.  

## 2015-03-14 NOTE — Progress Notes (Signed)
    [facility] has been requested to perform a transesophageal echocardiogram on Amber [name] for stroke workup.  After careful review of history and examination, the risks and benefits of transesophageal echocardiogram have been explained including risks of esophageal damage, perforation (1:10,000 risk), bleeding, pharyngeal hematoma as well as other potential complications associated with conscious sedation including aspiration, arrhythmia, respiratory failure and death. Alternatives to treatment were discussed, questions were answered. Patient is willing to proceed.   Wilburt Finlay, [facility] 03/14/2015 3:32 PM

## 2015-03-14 NOTE — H&P (Signed)
History and Physical  Amber [name]  UJW:[id number]  DOB: [birth year]  DOA: 03/14/2015  Referring physician: Tomasita Crumble, MD PCP: Morrie Sheldon, NP   Chief Complaint: Slurred speech  HPI: Amber [name] is a 44 y.o. female with a past medical history significant for hyperlipidemia, history of preeclampsia, and migraines who presents with slurred speech.  The patient was in her usual state of health until this evening when she was at her desk and felt "like had been hit in the face". She noticed that her right hand was shaking, so she went to find her cousin who noticed that her speech was slurred and the left side of her face was drooping. EMS brought the patient in with NIHSS 3.  In the ED, the patient was hypertensive to 165/109 mmHg, and had slurred speech. 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 Neurology were consulted in the ER who recommended admission for new stroke.   Review of Systems:  Patient seen 4:39 AM on 03/14/2015. Pt complains of slurred speech, left facial pain, left facial droop, hand tingling. All other systems negative except as just noted or noted in the history of present illness.  Past Medical History  Diagnosis Date  . Asthma   . Depression   . Headache   . Hyperlipidemia   . Hypertension   . UTI (lower urinary tract infection)   . IBS (irritable bowel syndrome)   . Migraine   The above past medical history was reviewed.  Past Surgical History  Procedure Laterality Date  . Abdominal hysterectomy  10/2006  . Toe surgery      Left 2nd metatarsal  . Cesarean section  2005  The above surgical history was reviewed.  Social History: Patient lives with her cousin and daughter. She works from home. She is a nonsmoker and does not drink. She is from [place] originally.    Allergies  Allergen Reactions  . Cephalexin Rash    Family History  Problem Relation Age of  Onset  . Arthritis Father   . Diabetes Father   . Hypertension Father   . Hyperlipidemia Father   . Hyperlipidemia Mother   . Heart attack Maternal Grandfather   . Stroke Paternal Grandfather     Prior to Admission medications   Medication Sig Start Date End Date Taking? Authorizing Provider  ergocalciferol (VITAMIN D2) 50000 UNITS capsule Take 50,000 Units by mouth once a week.    Historical Provider, MD  Norethindrone Acetate-Ethinyl Estrad-FE (LOESTRIN 24 FE) 1-20 MG-MCG(24) tablet Take 1 tablet by mouth daily.    Historical Provider, MD  traZODone (DESYREL) 50 MG tablet Take 1/2 - 1 tablet by mouth at bedtime as needed for sleep. 09/27/14   Doreene Nest, NP    Physical Exam: BP 137/94 mmHg  Pulse 92  Temp(Src) 98.4 F (36.9 C) (Oral)  Resp 22  SpO2 99% General appearance: Well-developed, adult female, alert and in no distress.  Responds appropriately to questions.   EENT: Eyes appear normal. Nose is normal without discharge. The mucous membranes are moist without erythema deformities.  Skin: Warm and dry.  No jaundice.  No suspicious rashes or lesions. Cardiac: RRR, nl S1-S2, no murmurs appreciated. No carotid bruits. Respiratory: Normal respiratory rate and rhythm.  CTAB without rales or wheezes. Abdomen: BS present.  Abdomen soft without rigidity.    Neuro: Sensorium intact.  speech is dysarthric. Thought process is linear and appropriate. Thought content  is normal. Attention and concentration are normal in the memory seems intact. Moves all extremities equally and with normal coordination.     Psych: Appropriate affect.  tearful at times.  No evidence of aural or visual hallucinations or delusions.       Labs on Admission:  The metabolic panel is notable for normal sodium, bicarbonate, potassium, and creatinine. Normal glucose. INR normal. Alcohol and urine drug screen negative. Troponin negative. The transaminases and bilirubin are normal. The complete blood  count is notable for normal platelet count, hemoglobin, and white blood cell count.      Radiological Exams on Admission: Ct Head Wo Contrast 03/14/2015  IMPRESSION: Negative noncontrast CT appearance of the brain.      Mr Brain Wo Contrast 03/14/2015   IMPRESSION:  1. Acute ischemic nonhemorrhagic linear infarct involving the posterior left frontal lobe cortical gray matter and subcortical white matter. No associated mass effect.  2. Small remote right parietal cortical infarct with additional probable tiny remote left cerebellar infarcts.  3. Mild chronic small vessel ischemic disease.        EKG: Independently reviewed. Normal sinus rhythm.      Assessment/Plan 1. Acute Stroke/TIA:  This is new.  The patient describes an episode 4 months ago when she had transient dysarthria resolved on its own similar to tonight. The case was discussed with neurology, who recommended workup for stroke etiology and initiation of secondary prevention regimen. -Admit to telemetry -Neuro checks, NIHSS per protocol -MRA brain is ordered -Echocardiogram is ordered -Carotid Dopplers are ordered -PT/OT/SLP -Consult to Neurology, appreciate recommendations -Lipids, hemoglobin A1c, thrombotic workup per neurology -Daily aspirin 325 mg      2. Essential hypertension:  This is new diagnosis for the patient. She has had elevated blood pressure noted in the past. She takes vinegar for blood pressure previously when she has a headache. The importance of blood pressure control was discussed in detail. At time of my exam she has stage I hypertension. -HCTZ 12.5 mg daily start tomorrow  3. Hyperlipidemia:  Stable.  -Atorvastatin 40 mg daily      DVT PPx: Lovenox Diet: Regular after swallow screen Consultants: Neurology Code Status: Full Family Communication: The diagnosis and expected plan of care were dsicussed with the family at the bedside.  All questions were answered.      Disposition Plan:  At the time of admission, it appears that the appropriate admission status for this patient is INPATIENT. This is judged to be reasonable and necessary in order to provide the required intensity of service to ensure the patient's safety given the presenting symptoms, physical exam findings, and initial radiographic and laboratory data in the context of their chronic comorbidities.  Together, these circumstances are felt to place her/him at high risk for further clinical deterioration threatening life, limb, or organ. The following factors support the admission status of inpatient:   A. The patient's presenting symptoms include slurred speech. B. The worrisome physical exam findings include dysarthria. C. The initial radiographic and laboratory data are worrisome because of infarction on MRI. D. The chronic co-morbidities include hypertension and hyperlipidemia. E. Patient requires inpatient status due to high intensity of service, high risk for further deterioration and high frequency of surveillance required. F. I certify that at the point of admission it is my clinical judgment that the patient will require inpatient hospital care spanning beyond 2 midnights from the point of admission.    Alberteen Sam [facility] Pager [phone]

## 2015-03-14 NOTE — Progress Notes (Signed)
Patient seen and examined  Patient does have dysarthria but ambulating with physical therapy, CT of the head and neck was negative  Discussed with Dr Roda Shutters, he recommends venous Doppler, 2-D echo which is still pending, TEE on Monday Family agreeable to stay on Monday and get the workup completed

## 2015-03-14 NOTE — Progress Notes (Signed)
Pt admitted from the ED with stroke diagnosis, pt alert and oriented with expressive aphasia, pt settled in bed, call light at bedside, will however continue to monitor. Obasogie-Asidi, Margretta Zamorano Efe

## 2015-03-14 NOTE — Consult Note (Signed)
Referring Physician: Oni    Chief Complaint: Difficulty with speech, rt facial numbness  HPI: Amber [name] is an 44 y.o. female without significant PMH who reports that this evening while doing work at home she developed pain on the left side of her face.  It was so severe that she was unable to continue her work.  She went to  The mirror and noted that her face did not look right.  When she went to talk to her cousin her speech would not come out normally.  EMS was called at that time and the patient was brought in as a code stroke.  Initial NIHSS of 3.  BP elevated.  Date last known well: Date: 03/13/2015 Time last known well: Time: 23:00 tPA Given: No: Minimal symptoms, patient not consenting  MRankin: 0  Past Medical History  Diagnosis Date  . Asthma   . Depression   . Headache   . Hyperlipidemia   . Hypertension   . UTI (lower urinary tract infection)   . IBS (irritable bowel syndrome)     Past Surgical History  Procedure Laterality Date  . Abdominal hysterectomy  10/2006  . Toe surgery      Left 2nd metatarsal    Family History  Problem Relation Age of Onset  . Arthritis Father   . Diabetes Father   . Hypertension Father   . Hyperlipidemia Father   . Hyperlipidemia Mother    Social History:  reports that she has never smoked. She does not have any smokeless tobacco history on file. She reports that she does not drink alcohol or use illicit drugs.  Allergies:  Allergies  Allergen Reactions  . Cephalexin Rash    Medications:  None  ROS: History obtained from the patient  General ROS: negative for - chills, fatigue, fever, night sweats, weight gain or weight loss Psychological ROS: negative for - behavioral disorder, hallucinations, memory difficulties, mood swings or suicidal ideation Ophthalmic ROS: negative for - blurry vision, double vision, eye pain or loss of vision ENT ROS: negative for - epistaxis, nasal discharge, oral lesions, sore throat,  tinnitus or vertigo Allergy and Immunology ROS: negative for - hives or itchy/watery eyes Hematological and Lymphatic ROS: negative for - bleeding problems, bruising or swollen lymph nodes Endocrine ROS: negative for - galactorrhea, hair pattern changes, polydipsia/polyuria or temperature intolerance Respiratory ROS: negative for - cough, hemoptysis, shortness of breath or wheezing Cardiovascular ROS: recent episodes of chest pain Gastrointestinal ROS: indigestion Genito-Urinary ROS: urinary frequency Musculoskeletal ROS: negative for - joint swelling or muscular weakness Neurological ROS: as noted in HPI Dermatological ROS: negative for rash and skin lesion changes  Physical Examination: Blood pressure 165/109, pulse 90, temperature 98.4 F (36.9 C), temperature source Oral, resp. rate 18, SpO2 99 %.  HEENT-  Normocephalic, no lesions, without obvious abnormality.  Normal external eye and conjunctiva.  Normal TM's bilaterally.  Normal auditory canals and external ears. Normal external nose, mucus membranes and septum.  Normal pharynx. Cardiovascular- S1, S2 normal, pulses palpable throughout   Lungs- chest clear, no wheezing, rales, normal symmetric air entry Abdomen- soft, non-tender; bowel sounds normal; no masses,  no organomegaly Extremities- no edema Lymph-no adenopathy palpable Musculoskeletal-no joint tenderness, deformity or swelling Skin-warm and dry, no hyperpigmentation, vitiligo, or suspicious lesions  Neurological Examination Mental Status: Alert, oriented, thought content appropriate although initially gave me the wrong month.  Able to remember phone numbers, etc.  Speech garbled but fluent.  Able to follow 3 step commands without  difficulty. Cranial Nerves: II: Discs flat bilaterally; Visual fields grossly normal, pupils equal, round, reactive to light and accommodation III,IV, VI: ptosis not present, extra-ocular motions intact bilaterally V,VII: smile symmetric but  when speaks mouth is twisted and right side of mouth used less well, facial light touch sensation decreased on the right VIII: hearing normal bilaterally IX,X: gag reflex present XI: bilateral shoulder shrug XII: midline tongue extension Motor: Right : Upper extremity   5/5    Left:     Upper extremity   5/5  Lower extremity   5/5     Lower extremity   5/5 Tone and bulk:normal tone throughout; no atrophy noted Sensory: Pinprick and light touch intact throughout, bilaterally Deep Tendon Reflexes: 2+ and symmetric throughout Plantars: Right: downgoing   Left: downgoing Cerebellar: normal finger-to-nose and normal heel-to-shin testing bilaterally Gait: not tested due to safety concerns   Laboratory Studies:  Basic Metabolic Panel:  Recent Labs Lab 03/14/15 0113  NA 141  K 3.5  CL 105  GLUCOSE 117*  BUN 18  CREATININE 1.10*    Liver Function Tests: No results for input(s): AST, ALT, ALKPHOS, BILITOT, PROT, ALBUMIN in the last 168 hours. No results for input(s): LIPASE, AMYLASE in the last 168 hours. No results for input(s): AMMONIA in the last 168 hours.  CBC:  Recent Labs Lab 03/14/15 0106 03/14/15 0113  WBC 5.8  --   NEUTROABS 1.9  --   HGB 14.5 16.0*  HCT 44.2 47.0*  MCV 87.0  --   PLT 207  --     Cardiac Enzymes: No results for input(s): CKTOTAL, CKMB, CKMBINDEX, TROPONINI in the last 168 hours.  BNP: Invalid input(s): POCBNP  CBG: No results for input(s): GLUCAP in the last 168 hours.  Microbiology: No results found for this or any previous visit.  Coagulation Studies:  Recent Labs  03/14/15 0106  LABPROT 13.3  INR 0.99    Urinalysis: No results for input(s): COLORURINE, LABSPEC, PHURINE, GLUCOSEU, HGBUR, BILIRUBINUR, KETONESUR, PROTEINUR, UROBILINOGEN, NITRITE, LEUKOCYTESUR in the last 168 hours.  Invalid input(s): APPERANCEUR  Lipid Panel: No results found for: CHOL, TRIG, HDL, CHOLHDL, VLDL, LDLCALC  HgbA1C: No results found for:  HGBA1C  Urine Drug Screen:  No results found for: LABOPIA, COCAINSCRNUR, LABBENZ, AMPHETMU, THCU, LABBARB  Alcohol Level: No results for input(s): ETH in the last 168 hours.  Other results: EKG: sinus rhythm at 90 bpm.  Imaging: Ct Head Wo Contrast  03/14/2015  ADDENDUM REPORT: 03/14/2015 01:21 ADDENDUM: Study discussed by telephone with Dr. Alexis Goodell on 03/14/2015 at 0120 hours. Electronically Signed   By: Genevie Ann M.D.   On: 03/14/2015 01:21  03/14/2015  CLINICAL DATA:  44 year old female with slurred speech and right arm weakness. Code stroke. Initial encounter. EXAM: CT HEAD WITHOUT CONTRAST TECHNIQUE: Contiguous axial images were obtained from the base of the skull through the vertex without intravenous contrast. COMPARISON:  None. FINDINGS: Visualized paranasal sinuses and mastoids are clear. No osseous abnormality identified. Visualized orbit soft tissues are within normal limits. Visualized scalp soft tissues are within normal limits. Mildly decreased for age cerebral volume. No midline shift, mass effect, or evidence of intracranial mass lesion. No ventriculomegaly. No acute intracranial hemorrhage identified. No suspicious intracranial vascular hyperdensity. No cortically based acute infarct identified. IMPRESSION: Negative noncontrast CT appearance of the brain. Electronically Signed: By: Genevie Ann M.D. On: 03/14/2015 01:17    Assessment: 44 y.o. female presenting with new onset dysarthria and right facial numbness.  Head CT  personally reviewed and shows no acute changes.  Patient is hypertensive and has a history of hyperlipidemia as well.  On no medications at home.  Symptoms may be related to elevated BP, PRES, acute infarct, etc.  Further work up recommended.    Stroke Risk Factors - hyperlipidemia and hypertension  Plan: 1. HgbA1c, fasting lipid panel, protein S, protein C, lupus anticoagulant, ATIII, factor V, homocysteine, ESR, anticardiolipin antibody 2. MRI, MRA  of the  brain without contrast 3. PT consult, OT consult, Speech consult 4. Echocardiogram 5. Carotid dopplers 6. Prophylactic therapy-Antiplatelet med: Aspirin - dose 355m daily 7. NPO until RN stroke swallow screen 8. Telemetry monitoring 9. Frequent neuro checks  Case discussed with Dr. ODebbora Dus MD [facility] [phone]/14/2016, 1:37 AM

## 2015-03-14 NOTE — ED Notes (Signed)
Pt taken to MRI  

## 2015-03-14 NOTE — ED Notes (Addendum)
Pt to ED via [facility] as a Code Stroke. LKW at midnight. Pt reports working from home, was putting a note in for work when she felt a sharp pain on the L side of head around 2300. At midnight, pt felt like tongue was swollen and was having difficulty speaking. Also reports numbness to R sided numbness. Code Stroke activated at [employer]

## 2015-03-14 NOTE — ED Provider Notes (Signed)
CSN: [id number]     Arrival date & time 03/14/15  0101 History   By signing my name below, I, Arlan Organ, attest that this documentation has been prepared under the direction and in the presence of Tomasita Crumble, MD. Electronically Signed: Arlan Organ, ED Scribe. 03/14/2015. 1:24 AM.   Chief Complaint  Patient presents with  . Code Stroke   The history is provided by the patient. No language interpreter was used.    HPI Comments: Amber [name] brought in by EMS is a 44 y.o. female with a PMHx of hyperlipidemia and HTN who presents to the Emergency Department here for a code stroke this evening. Pt reports an episode of pain to the L side of the face and heaviness to her tongue onset 11:00 PM while at home typing on her computer. Ongoing slurred speech also reported at time of onset of other symptoms this evening. Pt denies any previous history of same. No recent fever, chills, nausea, vomiting, chest pain, shortness of breath. Pt with known allergy to Cephalexin.  Past Medical History  Diagnosis Date  . Asthma   . Depression   . Headache   . Hyperlipidemia   . Hypertension   . UTI (lower urinary tract infection)   . IBS (irritable bowel syndrome)    Past Surgical History  Procedure Laterality Date  . Abdominal hysterectomy  10/2006  . Toe surgery      Left 2nd metatarsal   Family History  Problem Relation Age of Onset  . Arthritis Father   . Diabetes Father   . Hypertension Father   . Hyperlipidemia Father   . Hyperlipidemia Mother    Social History  Substance Use Topics  . Smoking status: Never Smoker   . Smokeless tobacco: Not on file  . Alcohol Use: No   OB History    No data available     Review of Systems  Constitutional: Negative for fever and chills.  Respiratory: Negative for cough and shortness of breath.   Cardiovascular: Negative for chest pain.  Gastrointestinal: Negative for nausea, vomiting and abdominal pain.  Genitourinary: Negative for dysuria.   Musculoskeletal: Positive for arthralgias. Negative for back pain.  Skin: Negative for rash.  Neurological: Positive for speech difficulty. Negative for dizziness, weakness, numbness and headaches.  Psychiatric/Behavioral: Negative for confusion.  All other systems reviewed and are negative.     Allergies  Cephalexin  Home Medications   Prior to Admission medications   Medication Sig Start Date End Date Taking? Authorizing Provider  ergocalciferol (VITAMIN D2) 50000 UNITS capsule Take 50,000 Units by mouth once a week.    Historical Provider, MD  Norethindrone Acetate-Ethinyl Estrad-FE (LOESTRIN 24 FE) 1-20 MG-MCG(24) tablet Take 1 tablet by mouth daily.    Historical Provider, MD  traZODone (DESYREL) 50 MG tablet Take 1/2 - 1 tablet by mouth at bedtime as needed for sleep. 09/27/14   Doreene Nest, NP   Triage Vitals: BP 165/109 mmHg  Pulse 90  Resp 18  SpO2 99%   Physical Exam  Constitutional: She is oriented to person, place, and time. She appears well-developed and well-nourished. No distress.  HENT:  Head: Normocephalic and atraumatic.  Nose: Nose normal.  Mouth/Throat: Oropharynx is clear and moist. No oropharyngeal exudate.  Eyes: Conjunctivae and EOM are normal. Pupils are equal, round, and reactive to light. No scleral icterus.  Neck: Normal range of motion. Neck supple. No JVD present. No tracheal deviation present. No thyromegaly present.  Cardiovascular: Normal rate, regular  rhythm and normal heart sounds.  Exam reveals no gallop and no friction rub.   No murmur heard. Pulmonary/Chest: Effort normal and breath sounds normal. No respiratory distress. She has no wheezes. She exhibits no tenderness.  Abdominal: Soft. Bowel sounds are normal. She exhibits no distension and no mass. There is no tenderness. There is no rebound and no guarding.  Musculoskeletal: Normal range of motion. She exhibits no edema or tenderness.  Lymphadenopathy:    She has no cervical  adenopathy.  Neurological: She is alert and oriented to person, place, and time. No cranial nerve deficit. She exhibits normal muscle tone.  Normal strength and sensation to all extremities Slurred speech noted   Skin: Skin is warm and dry. No rash noted. No erythema. No pallor.  Nursing note and vitals reviewed.   ED Course  Procedures (including critical care time)  DIAGNOSTIC STUDIES: Oxygen Saturation is 99% on RA, Normal by my interpretation.    COORDINATION OF CARE: 1:15 AM- Will order CT head without contrast, i-stat chem 8, ethanol, PT-INR, APTT, CBC, VMP, and urinalysis. Discussed treatment plan with pt at bedside and pt agreed to plan.     Labs Review Labs Reviewed  COMPREHENSIVE METABOLIC PANEL - Abnormal; Notable for the following:    Glucose, Bld 117 (*)    Creatinine, Ser 1.06 (*)    All other components within normal limits  URINALYSIS, ROUTINE W REFLEX MICROSCOPIC (NOT AT [facility]) - Abnormal; Notable for the following:    Leukocytes, UA TRACE (*)    All other components within normal limits  I-STAT CHEM 8, ED - Abnormal; Notable for the following:    Creatinine, Ser 1.10 (*)    Glucose, Bld 117 (*)    Calcium, Ion 1.11 (*)    Hemoglobin 16.0 (*)    HCT 47.0 (*)    All other components within normal limits  ETHANOL  PROTIME-INR  APTT  CBC  DIFFERENTIAL  URINE RAPID DRUG SCREEN, HOSP PERFORMED  URINE MICROSCOPIC-ADD ON  Rosezena Sensor, ED    Imaging Review Ct Head Wo Contrast  03/14/2015  ADDENDUM REPORT: 03/14/2015 01:21 ADDENDUM: Study discussed by telephone with Dr. Thana Farr on 03/14/2015 at 0120 hours. Electronically Signed   By: Odessa Fleming M.D.   On: 03/14/2015 01:21  03/14/2015  CLINICAL DATA:  44 year old female with slurred speech and right arm weakness. Code stroke. Initial encounter. EXAM: CT HEAD WITHOUT CONTRAST TECHNIQUE: Contiguous axial images were obtained from the base of the skull through the vertex without intravenous contrast.  COMPARISON:  None. FINDINGS: Visualized paranasal sinuses and mastoids are clear. No osseous abnormality identified. Visualized orbit soft tissues are within normal limits. Visualized scalp soft tissues are within normal limits. Mildly decreased for age cerebral volume. No midline shift, mass effect, or evidence of intracranial mass lesion. No ventriculomegaly. No acute intracranial hemorrhage identified. No suspicious intracranial vascular hyperdensity. No cortically based acute infarct identified. IMPRESSION: Negative noncontrast CT appearance of the brain. Electronically Signed: By: Odessa Fleming M.D. On: 03/14/2015 01:17   Mr Brain Wo Contrast  03/14/2015  CLINICAL DATA:  Initial valuation for acute episode of dysarthria EXAM: MRI HEAD WITHOUT CONTRAST TECHNIQUE: Multiplanar, multiecho pulse sequences of the brain and surrounding structures were obtained without intravenous contrast. COMPARISON:  Prior noncontrast head CT from earlier the same day. FINDINGS: Cerebral volume within normal limits for patient age. Minimal patchy T2/FLAIR hyperintensity within the periventricular white matter noted, likely related to very mild chronic small vessel ischemic disease. Small focus  of FLAIR hyperintensity within the cortex of the high right parietal lobe likely reflects a small remote cortical infarct (series 10, image 16). Probable few scattered small remote left cerebellar infarcts noted as well. There is linear focus of restricted diffusion involving the posterior cortex and underlying subcortical white matter in the posterior left frontal region (series 3, image 30). Corresponding signal loss seen on ADC map (series 300, image 31). No other areas of acute infarction identified. Gray-white matter differentiation otherwise maintained. Normal intravascular flow voids are preserved. No acute or chronic intracranial hemorrhage. No mass lesion, midline shift, or mass effect. No hydrocephalus. No extra-axial fluid collection.  Craniocervical junction within normal limits. Pituitary gland normal. No acute abnormality about the orbits. Mild mucosal thickening within the ethmoidal air cells. Paranasal sinuses are otherwise clear. No mastoid effusion. Inner ear structures normal. Bone marrow signal intensity within normal limits. No scalp soft tissue abnormality. IMPRESSION: 1. Acute ischemic nonhemorrhagic linear infarct involving the posterior left frontal lobe cortical gray matter and subcortical white matter. No associated mass effect. 2. Small remote right parietal cortical infarct with additional probable tiny remote left cerebellar infarcts. 3. Mild chronic small vessel ischemic disease. Electronically Signed   By: Rise Mu M.D.   On: 03/14/2015 03:33   I have personally reviewed and evaluated these images and lab results as part of my medical decision-making.   EKG Interpretation   Date/Time:  Friday March 14 2015 01:27:16 EDT Ventricular Rate:  90 PR Interval:  161 QRS Duration: 124 QT Interval:  392 QTC Calculation: 480 R Axis:   21 Text Interpretation:  Sinus rhythm Nonspecific intraventricular conduction  delay Consider anterior infarct Artifact No old tracing to compare  Confirmed by Erroll Luna [phone]) on 03/14/2015 1:37:15 AM      MDM   Final diagnoses:  None   Patient presents to the emergency department for slurred speech and heaviness in her tongue. My initial evaluation does not reveal concern for stroke. Dr. Thad Ranger evaluated the patient as well, she recommends for MRI for further evaluation. Laboratory studies and EKG have been unremarkable.  MRI reveals an acute infarct. I spoke with Dr. Thad Ranger again, she recommends medical admission for stroke workup. I spoke with Dr. Maryfrances Bunnell with [facility] who will admit the patient for further management.   I, Yaniyah Koors, personally performed the services described in this documentation. All medical record entries  made by the scribe were at my direction and in my presence.  I have reviewed the chart and discharge instructions and agree that the record reflects my personal performance and is accurate and complete. Karesha Trzcinski.  03/14/2015. 2:34 AM.     Tomasita Crumble, MD 03/14/15 1700

## 2015-03-14 NOTE — ED Notes (Signed)
Patient is currently eating meal.

## 2015-03-15 DIAGNOSIS — I6300 Cerebral infarction due to thrombosis of unspecified precerebral artery: Secondary | ICD-10-CM

## 2015-03-15 DIAGNOSIS — R471 Dysarthria and anarthria: Secondary | ICD-10-CM

## 2015-03-15 LAB — BASIC METABOLIC PANEL
ANION GAP: 12 (ref 5–15)
BUN: 13 mg/dL (ref 6–20)
CALCIUM: 9.4 mg/dL (ref 8.9–10.3)
CO2: 19 mmol/L — ABNORMAL LOW (ref 22–32)
Chloride: 107 mmol/L (ref 101–111)
Creatinine, Ser: 0.78 mg/dL (ref 0.44–1.00)
GLUCOSE: 99 mg/dL (ref 65–99)
POTASSIUM: 5.7 mmol/L — AB (ref 3.5–5.1)
SODIUM: 138 mmol/L (ref 135–145)

## 2015-03-15 LAB — HEMOGLOBIN A1C
HEMOGLOBIN A1C: 6.1 % — AB (ref 4.8–5.6)
Mean Plasma Glucose: 128 mg/dL

## 2015-03-15 LAB — GLUCOSE, CAPILLARY
GLUCOSE-CAPILLARY: 98 mg/dL (ref 65–99)
GLUCOSE-CAPILLARY: 92 mg/dL (ref 65–99)
Glucose-Capillary: 96 mg/dL (ref 65–99)
Glucose-Capillary: 119 mg/dL — ABNORMAL HIGH (ref 65–99)

## 2015-03-15 MED ORDER — TEMAZEPAM 15 MG PO CAPS
30.0000 mg | ORAL_CAPSULE | Freq: Every evening | ORAL | Status: DC | PRN
  Administered 2015-03-15 – 2015-03-16 (×2): 30 mg via ORAL
  Filled 2015-03-15 (×2): qty 2

## 2015-03-15 MED ORDER — SODIUM POLYSTYRENE SULFONATE 15 GM/60ML PO SUSP
30.0000 g | Freq: Once | ORAL | Status: AC
  Administered 2015-03-15: 30 g via ORAL
  Filled 2015-03-15: qty 120

## 2015-03-15 MED ORDER — OXYCODONE HCL 5 MG PO TABS
5.0000 mg | ORAL_TABLET | ORAL | Status: DC | PRN
  Administered 2015-03-15 (×3): 5 mg via ORAL
  Filled 2015-03-15 (×4): qty 1

## 2015-03-15 NOTE — Progress Notes (Signed)
OT Cancellation Note  Patient Details Name: Amber [name] MRN: [id number] DOB: 12/07/1970   Cancelled Treatment:    Reason Eval/Treat Not Completed: Other (comment) Pts sleeping. Pts mom refused for her.  Will reattempt as able.  Dorena Bodo [place], [place] [phone] 03/15/2015, 9:15 AM

## 2015-03-15 NOTE — Progress Notes (Addendum)
[facility] PROGRESS NOTE  Amber [name] UJW:[id number] DOB: 12/28/1970 DOA: 03/14/2015 PCP: Morrie Sheldon, NP  Length of stay: 1   Assessment/Plan: Principal Problem:   Acute ischemic stroke (HCC) Active Problems:   Hyperlipidemia   Essential hypertension   Stroke [facility])   Dysarthria    Brief summary 44 y.o. female without significant PMH who reports that this evening while doing work at home she developed pain on the left side of her face. It was so severe that she was unable to continue her work. She went to The mirror and noted that her face did not look right. When she went to talk to her cousin her speech would not come out normally. EMS was called at that time and the patient was brought in as a code stroke. Initial NIHSS of 3. BP elevated.  Assessment and plan Dominant left MCA small cortica infarct, embolic pattern secondary to unknown source  MRI Left MCA cortical small linear infarct  CTA head and neck negative for dissection  2D Echo EF 50%, no SOE  TCD bubble study negative for PFO  Neurology Recommend TEE to rule out cardiac source of emboli, likely this is to be done on Monday  Hypercoagulable and autoimmune work up pending  LDL 170, hemoglobin A1c 6.1, continue aspirin 325 mg by mouth daily for now, continue statin  Patient will need outpatient speech therapy evaluation, PT recommends outpatient physical therapy  Transcranial Doppler without any PFO at rest  Headache-likely secondary to stroke, insomnia, patient started on Restoril and Percocet  Dyslipidemia continue statin  Hyperkalemia treated with Kayexalate this morning  DVT prophylaxsis Lovenox  Code Status:      Code Status Orders        Start     Ordered   03/14/15 0558  Full code   Continuous     03/14/15 0557     Family Communication: family updated about patient's clinical progress Disposition Plan:  Anticipate discharge on  Monday    Consultants:  Neurology  Procedures:  1  Antibiotics: Anti-infectives    None         HPI/Subjective: Patient complaining of a headache this morning, blood pressure soft,SLEPT only 4 hours last night  Objective: Filed Vitals:   03/14/15 2207 03/15/15 0148 03/15/15 0557 03/15/15 0931  BP: 109/77 106/78 124/91 109/70  Pulse: 68 75 71 84  Temp: 98.1 F (36.7 C) 98 F (36.7 C) 98.7 F (37.1 C) 98 F (36.7 C)  TempSrc: Oral Oral Oral Oral  Resp: 20 20 20 16   Height:      Weight:      SpO2: 100% 100% 100% 99%    Intake/Output Summary (Last 24 hours) at 03/15/15 1103 Last data filed at 03/15/15 0530  Gross per 24 hour  Intake    480 ml  Output      0 ml  Net    480 ml    Exam:  General: No acute respiratory distress Lungs: Clear to auscultation bilaterally without wheezes or crackles Cardiovascular: Regular rate and rhythm without murmur gallop or rub normal S1 and S2 Abdomen: Nontender, nondistended, soft, bowel sounds positive, no rebound, no ascites, no appreciable mass NeurologicThe patient's strength was normal in all extremities and pronator drift was absent. Bulk was normal and fasciculations were absent.  Motor Tone - Muscle tone was assessed at the neck and appendages and was normal     Data Review   Micro Results No results found  for this or any previous visit (from the past 240 hour(s)).  Radiology Reports Ct Angio Head W/cm &/or Wo Cm  03/14/2015  CLINICAL DATA:  Acute cerebral infarct. EXAM: CT ANGIOGRAPHY HEAD AND NECK TECHNIQUE: Multidetector CT imaging of the head and neck was performed using the standard protocol during bolus administration of intravenous contrast. Multiplanar CT image reconstructions and MIPs were obtained to evaluate the vascular anatomy. Carotid stenosis measurements (when applicable) are obtained utilizing NASCET criteria, using the distal internal carotid diameter as the denominator. CONTRAST:  30mL  OMNIPAQUE IOHEXOL 350 MG/ML SOLN COMPARISON:  None. FINDINGS: CTA NECK Aortic arch: No aneurysm, dissection, or wall thickening. Two vessel branching. Right carotid system: Widely patent. No atheromatous changes or dissection. Left carotid system: Widely patent. No atheromatous changes or dissection. No evidence of vasculopathy in the carotid systems. Vertebral arteries:Symmetric vertebral arteries. Limited evaluation of the left V1 and proximal V2 segments due to intravenous contrast streak artifact. No evidence of stenosis or vasculopathy. Skeleton: Negative Other neck: No incidentally detected mass or concerning nodes in the neck. Clear apical lungs. CTA HEAD Limited by venous contamination. Anterior circulation: Symmetric carotid arteries. No significant communicating arteries. No major vessel occlusion or flow limiting stenosis. No indication of atherosclerosis or vasculopathy. No aneurysm. Posterior circulation: Symmetric vertebral arteries. Symmetric vertebral and basilar branching. No major vessel occlusion, flow limiting stenosis, or evidence of atherosclerosis or vasculopathy. No aneurysm. Venous sinuses: Patent Anatomic variants: Incomplete circle-of-Willis with no significant communicating arteries. Delayed phase: No parenchymal enhancement or mass lesion detected. IMPRESSION: Negative CTA of the head and neck. Electronically Signed   By: Marnee Spring M.D.   On: 03/14/2015 08:01   Ct Head Wo Contrast  03/14/2015  ADDENDUM REPORT: 03/14/2015 01:21 ADDENDUM: Study discussed by telephone with Dr. Thana Farr on 03/14/2015 at 0120 hours. Electronically Signed   By: Odessa Fleming M.D.   On: 03/14/2015 01:21  03/14/2015  CLINICAL DATA:  44 year old female with slurred speech and right arm weakness. Code stroke. Initial encounter. EXAM: CT HEAD WITHOUT CONTRAST TECHNIQUE: Contiguous axial images were obtained from the base of the skull through the vertex without intravenous contrast. COMPARISON:  None.  FINDINGS: Visualized paranasal sinuses and mastoids are clear. No osseous abnormality identified. Visualized orbit soft tissues are within normal limits. Visualized scalp soft tissues are within normal limits. Mildly decreased for age cerebral volume. No midline shift, mass effect, or evidence of intracranial mass lesion. No ventriculomegaly. No acute intracranial hemorrhage identified. No suspicious intracranial vascular hyperdensity. No cortically based acute infarct identified. IMPRESSION: Negative noncontrast CT appearance of the brain. Electronically Signed: By: Odessa Fleming M.D. On: 03/14/2015 01:17   Ct Angio Neck W/cm &/or Wo/cm  03/14/2015  CLINICAL DATA:  Acute cerebral infarct. EXAM: CT ANGIOGRAPHY HEAD AND NECK TECHNIQUE: Multidetector CT imaging of the head and neck was performed using the standard protocol during bolus administration of intravenous contrast. Multiplanar CT image reconstructions and MIPs were obtained to evaluate the vascular anatomy. Carotid stenosis measurements (when applicable) are obtained utilizing NASCET criteria, using the distal internal carotid diameter as the denominator. CONTRAST:  68mL OMNIPAQUE IOHEXOL 350 MG/ML SOLN COMPARISON:  None. FINDINGS: CTA NECK Aortic arch: No aneurysm, dissection, or wall thickening. Two vessel branching. Right carotid system: Widely patent. No atheromatous changes or dissection. Left carotid system: Widely patent. No atheromatous changes or dissection. No evidence of vasculopathy in the carotid systems. Vertebral arteries:Symmetric vertebral arteries. Limited evaluation of the left V1 and proximal V2 segments due to intravenous contrast  streak artifact. No evidence of stenosis or vasculopathy. Skeleton: Negative Other neck: No incidentally detected mass or concerning nodes in the neck. Clear apical lungs. CTA HEAD Limited by venous contamination. Anterior circulation: Symmetric carotid arteries. No significant communicating arteries. No major  vessel occlusion or flow limiting stenosis. No indication of atherosclerosis or vasculopathy. No aneurysm. Posterior circulation: Symmetric vertebral arteries. Symmetric vertebral and basilar branching. No major vessel occlusion, flow limiting stenosis, or evidence of atherosclerosis or vasculopathy. No aneurysm. Venous sinuses: Patent Anatomic variants: Incomplete circle-of-Willis with no significant communicating arteries. Delayed phase: No parenchymal enhancement or mass lesion detected. IMPRESSION: Negative CTA of the head and neck. Electronically Signed   By: Marnee Spring M.D.   On: 03/14/2015 08:01   Mr Brain Wo Contrast  03/14/2015  CLINICAL DATA:  Initial valuation for acute episode of dysarthria EXAM: MRI HEAD WITHOUT CONTRAST TECHNIQUE: Multiplanar, multiecho pulse sequences of the brain and surrounding structures were obtained without intravenous contrast. COMPARISON:  Prior noncontrast head CT from earlier the same day. FINDINGS: Cerebral volume within normal limits for patient age. Minimal patchy T2/FLAIR hyperintensity within the periventricular white matter noted, likely related to very mild chronic small vessel ischemic disease. Small focus of FLAIR hyperintensity within the cortex of the high right parietal lobe likely reflects a small remote cortical infarct (series 10, image 16). Probable few scattered small remote left cerebellar infarcts noted as well. There is linear focus of restricted diffusion involving the posterior cortex and underlying subcortical white matter in the posterior left frontal region (series 3, image 30). Corresponding signal loss seen on ADC map (series 300, image 31). No other areas of acute infarction identified. Gray-white matter differentiation otherwise maintained. Normal intravascular flow voids are preserved. No acute or chronic intracranial hemorrhage. No mass lesion, midline shift, or mass effect. No hydrocephalus. No extra-axial fluid collection.  Craniocervical junction within normal limits. Pituitary gland normal. No acute abnormality about the orbits. Mild mucosal thickening within the ethmoidal air cells. Paranasal sinuses are otherwise clear. No mastoid effusion. Inner ear structures normal. Bone marrow signal intensity within normal limits. No scalp soft tissue abnormality. IMPRESSION: 1. Acute ischemic nonhemorrhagic linear infarct involving the posterior left frontal lobe cortical gray matter and subcortical white matter. No associated mass effect. 2. Small remote right parietal cortical infarct with additional probable tiny remote left cerebellar infarcts. 3. Mild chronic small vessel ischemic disease. Electronically Signed   By: Rise Mu M.D.   On: 03/14/2015 03:33     CBC  Recent Labs Lab 03/14/15 0106 03/14/15 0113 03/14/15 0930  WBC 5.8  --  5.4  HGB 14.5 16.0* 14.0  HCT 44.2 47.0* 42.9  PLT 207  --  215  MCV 87.0  --  87.0  MCH 28.5  --  28.4  MCHC 32.8  --  32.6  RDW 12.8  --  12.9  LYMPHSABS 3.4  --   --   MONOABS 0.4  --   --   EOSABS 0.1  --   --   BASOSABS 0.0  --   --     Chemistries   Recent Labs Lab 03/14/15 0106 03/14/15 0113 03/14/15 0930 03/15/15 0748  NA 140 141  --  138  K 3.6 3.5  --  5.7*  CL 104 105  --  107  CO2 24  --   --  19*  GLUCOSE 117* 117*  --  99  BUN 16 18  --  13  CREATININE 1.06* 1.10* 0.98 0.78  CALCIUM 9.6  --   --  9.4  AST 17  --   --   --   ALT 18  --   --   --   ALKPHOS 59  --   --   --   BILITOT 0.5  --   --   --    ------------------------------------------------------------------------------------------------------------------ estimated creatinine clearance is 86.3 mL/min (by C-G formula based on Cr of 0.78). ------------------------------------------------------------------------------------------------------------------  Recent Labs  03/14/15 0930  HGBA1C 6.1*    ------------------------------------------------------------------------------------------------------------------  Recent Labs  03/14/15 0930  CHOL 265*  HDL 81  LDLCALC 170*  TRIG 71  CHOLHDL 3.3   ------------------------------------------------------------------------------------------------------------------ No results for input(s): TSH, T4TOTAL, T3FREE, THYROIDAB in the last 72 hours.  Invalid input(s): FREET3 ------------------------------------------------------------------------------------------------------------------ No results for input(s): VITAMINB12, FOLATE, FERRITIN, TIBC, IRON, RETICCTPCT in the last 72 hours.  Coagulation profile  Recent Labs Lab 03/14/15 0106  INR 0.99    No results for input(s): DDIMER in the last 72 hours.  Cardiac Enzymes No results for input(s): CKMB, TROPONINI, MYOGLOBIN in the last 168 hours.  Invalid input(s): CK ------------------------------------------------------------------------------------------------------------------ Invalid input(s): POCBNP   CBG:  Recent Labs Lab 03/15/15 0640  GLUCAP 98       Studies: Ct Angio Head W/cm &/or Wo Cm  03/14/2015  CLINICAL DATA:  Acute cerebral infarct. EXAM: CT ANGIOGRAPHY HEAD AND NECK TECHNIQUE: Multidetector CT imaging of the head and neck was performed using the standard protocol during bolus administration of intravenous contrast. Multiplanar CT image reconstructions and MIPs were obtained to evaluate the vascular anatomy. Carotid stenosis measurements (when applicable) are obtained utilizing NASCET criteria, using the distal internal carotid diameter as the denominator. CONTRAST:  80mL OMNIPAQUE IOHEXOL 350 MG/ML SOLN COMPARISON:  None. FINDINGS: CTA NECK Aortic arch: No aneurysm, dissection, or wall thickening. Two vessel branching. Right carotid system: Widely patent. No atheromatous changes or dissection. Left carotid system: Widely patent. No atheromatous changes or  dissection. No evidence of vasculopathy in the carotid systems. Vertebral arteries:Symmetric vertebral arteries. Limited evaluation of the left V1 and proximal V2 segments due to intravenous contrast streak artifact. No evidence of stenosis or vasculopathy. Skeleton: Negative Other neck: No incidentally detected mass or concerning nodes in the neck. Clear apical lungs. CTA HEAD Limited by venous contamination. Anterior circulation: Symmetric carotid arteries. No significant communicating arteries. No major vessel occlusion or flow limiting stenosis. No indication of atherosclerosis or vasculopathy. No aneurysm. Posterior circulation: Symmetric vertebral arteries. Symmetric vertebral and basilar branching. No major vessel occlusion, flow limiting stenosis, or evidence of atherosclerosis or vasculopathy. No aneurysm. Venous sinuses: Patent Anatomic variants: Incomplete circle-of-Willis with no significant communicating arteries. Delayed phase: No parenchymal enhancement or mass lesion detected. IMPRESSION: Negative CTA of the head and neck. Electronically Signed   By: Marnee Spring M.D.   On: 03/14/2015 08:01   Ct Head Wo Contrast  03/14/2015  ADDENDUM REPORT: 03/14/2015 01:21 ADDENDUM: Study discussed by telephone with Dr. Thana Farr on 03/14/2015 at 0120 hours. Electronically Signed   By: Odessa Fleming M.D.   On: 03/14/2015 01:21  03/14/2015  CLINICAL DATA:  44 year old female with slurred speech and right arm weakness. Code stroke. Initial encounter. EXAM: CT HEAD WITHOUT CONTRAST TECHNIQUE: Contiguous axial images were obtained from the base of the skull through the vertex without intravenous contrast. COMPARISON:  None. FINDINGS: Visualized paranasal sinuses and mastoids are clear. No osseous abnormality identified. Visualized orbit soft tissues are within normal limits. Visualized scalp soft tissues are within normal limits. Mildly decreased for  age cerebral volume. No midline shift, mass effect, or  evidence of intracranial mass lesion. No ventriculomegaly. No acute intracranial hemorrhage identified. No suspicious intracranial vascular hyperdensity. No cortically based acute infarct identified. IMPRESSION: Negative noncontrast CT appearance of the brain. Electronically Signed: By: Odessa Fleming M.D. On: 03/14/2015 01:17   Ct Angio Neck W/cm &/or Wo/cm  03/14/2015  CLINICAL DATA:  Acute cerebral infarct. EXAM: CT ANGIOGRAPHY HEAD AND NECK TECHNIQUE: Multidetector CT imaging of the head and neck was performed using the standard protocol during bolus administration of intravenous contrast. Multiplanar CT image reconstructions and MIPs were obtained to evaluate the vascular anatomy. Carotid stenosis measurements (when applicable) are obtained utilizing NASCET criteria, using the distal internal carotid diameter as the denominator. CONTRAST:  80mL OMNIPAQUE IOHEXOL 350 MG/ML SOLN COMPARISON:  None. FINDINGS: CTA NECK Aortic arch: No aneurysm, dissection, or wall thickening. Two vessel branching. Right carotid system: Widely patent. No atheromatous changes or dissection. Left carotid system: Widely patent. No atheromatous changes or dissection. No evidence of vasculopathy in the carotid systems. Vertebral arteries:Symmetric vertebral arteries. Limited evaluation of the left V1 and proximal V2 segments due to intravenous contrast streak artifact. No evidence of stenosis or vasculopathy. Skeleton: Negative Other neck: No incidentally detected mass or concerning nodes in the neck. Clear apical lungs. CTA HEAD Limited by venous contamination. Anterior circulation: Symmetric carotid arteries. No significant communicating arteries. No major vessel occlusion or flow limiting stenosis. No indication of atherosclerosis or vasculopathy. No aneurysm. Posterior circulation: Symmetric vertebral arteries. Symmetric vertebral and basilar branching. No major vessel occlusion, flow limiting stenosis, or evidence of atherosclerosis or  vasculopathy. No aneurysm. Venous sinuses: Patent Anatomic variants: Incomplete circle-of-Willis with no significant communicating arteries. Delayed phase: No parenchymal enhancement or mass lesion detected. IMPRESSION: Negative CTA of the head and neck. Electronically Signed   By: Marnee Spring M.D.   On: 03/14/2015 08:01   Mr Brain Wo Contrast  03/14/2015  CLINICAL DATA:  Initial valuation for acute episode of dysarthria EXAM: MRI HEAD WITHOUT CONTRAST TECHNIQUE: Multiplanar, multiecho pulse sequences of the brain and surrounding structures were obtained without intravenous contrast. COMPARISON:  Prior noncontrast head CT from earlier the same day. FINDINGS: Cerebral volume within normal limits for patient age. Minimal patchy T2/FLAIR hyperintensity within the periventricular white matter noted, likely related to very mild chronic small vessel ischemic disease. Small focus of FLAIR hyperintensity within the cortex of the high right parietal lobe likely reflects a small remote cortical infarct (series 10, image 16). Probable few scattered small remote left cerebellar infarcts noted as well. There is linear focus of restricted diffusion involving the posterior cortex and underlying subcortical white matter in the posterior left frontal region (series 3, image 30). Corresponding signal loss seen on ADC map (series 300, image 31). No other areas of acute infarction identified. Gray-white matter differentiation otherwise maintained. Normal intravascular flow voids are preserved. No acute or chronic intracranial hemorrhage. No mass lesion, midline shift, or mass effect. No hydrocephalus. No extra-axial fluid collection. Craniocervical junction within normal limits. Pituitary gland normal. No acute abnormality about the orbits. Mild mucosal thickening within the ethmoidal air cells. Paranasal sinuses are otherwise clear. No mastoid effusion. Inner ear structures normal. Bone marrow signal intensity within normal  limits. No scalp soft tissue abnormality. IMPRESSION: 1. Acute ischemic nonhemorrhagic linear infarct involving the posterior left frontal lobe cortical gray matter and subcortical white matter. No associated mass effect. 2. Small remote right parietal cortical infarct with additional probable tiny remote left cerebellar infarcts.  3. Mild chronic small vessel ischemic disease. Electronically Signed   By: Rise Mu M.D.   On: 03/14/2015 03:33      Lab Results  Component Value Date   HGBA1C 6.1* 03/14/2015   Lab Results  Component Value Date   LDLCALC 170* 03/14/2015   CREATININE 0.78 03/15/2015       Scheduled Meds: . aspirin  300 mg Rectal Daily   Or  . aspirin  325 mg Oral Daily  . atorvastatin  40 mg Oral q1800  . enoxaparin (LOVENOX) injection  40 mg Subcutaneous Daily   Continuous Infusions:   Principal Problem:   Acute ischemic stroke [facility]) Active Problems:   Hyperlipidemia   Essential hypertension   Stroke [facility])   Dysarthria    Time spent: 45 minutes   [facility]  [facility] Pager [phone]. If 7PM-7AM, please contact night-coverage at [email or web address], password [facility] 03/15/2015, 11:03 AM  LOS: 1 day

## 2015-03-15 NOTE — Progress Notes (Signed)
Physical Therapy Treatment Patient Details Name: Amber [name] MRN: [id number] DOB: 1971-05-04 Today's Date: 03/15/2015    History of Present Illness 44 y.o. female admitted to [facility] on 03/14/15 with slurred speech and left facial droop.  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  Pt with significant PMHx of asthma, depression, HA (migraines), HTN, and left second metatarsal surgery.     PT Comments    DGI performed today with score indicating pt at risk of falling. Issued balance HEP today. Pt making steady progress toward goals. Still appropriate for Outpatient rehab for balance and strengthening post acute care.  Follow Up Recommendations  Outpatient PT;Supervision - Intermittent     Equipment Recommendations  None recommended by PT       Precautions / Restrictions Precautions Precautions: Fall Restrictions Weight Bearing Restrictions: No    Mobility  Bed Mobility Overal bed mobility: Modified Independent    General bed mobility comments: with bed flat and no rails used. increased time needed  Transfers Overall transfer level: Needs assistance   Transfers: Sit to/from Stand Sit to Stand: Supervision     General transfer comment: increased time needed  Ambulation/Gait Ambulation/Gait assistance: Supervision;Min guard Ambulation Distance (Feet): 250 Feet Assistive device: None Gait Pattern/deviations: Step-through pattern;Narrow base of support;Shuffle (other issues noted during DGI: veering, cadence change) Gait velocity: decreased Gait velocity interpretation: Below normal speed for age/gender General Gait Details: No buckling noted with gait. Perfers slow, cautious gait speed, however was able to increase gait speed wtih DGI with cues and encouragement with some veering noted.   Stairs Stairs: Yes Stairs assistance: Supervision Stair Management: Alternating  pattern;Step to pattern;Forwards;One rail Right Number of Stairs: 11 General stair comments: reciprocal pattern up, step to pattern down with single rail support.  Wheelchair Mobility    Modified Rankin (Stroke Patients Only) Modified Rankin (Stroke Patients Only) Pre-Morbid Rankin Score: No symptoms Modified Rankin: Moderately severe disability     Cognition Arousal/Alertness: Awake/alert Behavior During Therapy: WFL for tasks assessed/performed Overall Cognitive Status: Within Functional Limits for tasks assessed        General Comments General comments (skin integrity, edema, etc.): Scores less than 19/24 on DGI are indicative of fall risk. Pt scored 17/24 today. Issued balance HEP today: standing with feet together eyes closed. tandem standing with eyes open and single leg stance with eyes open.      Pertinent Vitals/Pain Pain Assessment: 0-10 Pain Score: 2  Pain Location: head Pain Descriptors / Indicators: Dull Pain Intervention(s): Limited activity within patient's tolerance;Monitored during session;Premedicated before session    Home Living Family/patient expects to be discharged to:: Private residence Living Arrangements: Children;Other relatives (53 y.o. daughter and cousin) Available Help at Discharge: Family;Available PRN/intermittently Type of Home: House (townhome)     Home Layout: Two level Home Equipment: None      Prior Function Level of Independence: Independent      Comments: works full time   PT Goals (current goals can now be found in the care plan section) Acute Rehab PT Goals Patient Stated Goal: to get back to normal and figure out what she has to do to not have a stroke again PT Goal Formulation: With patient Time For Goal Achievement: 03/28/15 Potential to Achieve Goals: Good Progress towards PT goals: Progressing toward goals    Frequency  Min 4X/week    PT Plan Current plan remains appropriate       End of Session  Equipment  Utilized During Treatment: Gait belt Activity Tolerance: Patient tolerated treatment well Patient left: in bed;with call bell/phone within reach;with family/visitor present     Time: 1411-1455 PT Time Calculation (min) (ACUTE ONLY): 44 min  Charges:  $Gait Training: 23-37 mins $Neuromuscular Re-education: 8-22 mins            Sallyanne Kuster 03/15/2015, 3:54 PM  Sallyanne Kuster, PTA, CLT Acute Rehab Services [phone] 03/15/15, 3:57 PM

## 2015-03-15 NOTE — Evaluation (Signed)
Occupational Therapy Evaluation Patient Details Name: Amber [name] MRN: [id number] DOB: 1970/08/22 Today's Date: 03/15/2015    History of Present Illness 44 y.o. female admitted to [facility] on 03/14/15 with slurred speech and left facial droop.  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  Pt with significant PMHx of asthma, depression, HA (migraines), HTN, and left second metatarsal surgery.    Clinical Impression   Pt admitted with CVA. Pt currently with functional limitations due to the deficits listed below (see OT Problem List).  Pt will benefit from skilled OT to increase their safety and independence with ADL and functional mobility for ADL to facilitate discharge to venue listed below.      Follow Up Recommendations  Outpatient OT          Precautions / Restrictions Precautions Precautions: Fall      Mobility Bed Mobility Overal bed mobility: Modified Independent                Transfers Overall transfer level: Needs assistance   Transfers: Sit to/from Stand Sit to Stand: Supervision         General transfer comment: supervision for safety due to slow speed of transition.           ADL Overall ADL's : Needs assistance/impaired Eating/Feeding: Set up;Sitting   Grooming: Set up;Sitting   Upper Body Bathing: Set up;Sitting   Lower Body Bathing: Minimal assistance   Upper Body Dressing : Set up;Sitting   Lower Body Dressing: Sit to/from stand;Minimal assistance   Toilet Transfer: Minimal assistance;Cueing for safety   Toileting- Clothing Manipulation and Hygiene: Min guard;Sit to/from stand         General ADL Comments: pt moves slowly.  Pts speech seems to be biggest impairment.  Encouraged pt to speak and for mother to let her talk and not speak for her.  Pts has a headache and felt like this was limiting her activity               Pertinent  Vitals/Pain Pain Assessment: 0-10 Pain Score: 4  Pain Location: head Pain Descriptors / Indicators: Dull Pain Intervention(s): Limited activity within patient's tolerance;Monitored during session     Hand Dominance Right   Extremity/Trunk Assessment Upper Extremity Assessment Upper Extremity Assessment: Overall WFL for tasks assessed           Communication Communication Communication: Expressive difficulties   Cognition Arousal/Alertness: Awake/alert Behavior During Therapy: WFL for tasks assessed/performed Overall Cognitive Status: Within Functional Limits for tasks assessed                                Home Living Family/patient expects to be discharged to:: Private residence Living Arrangements: Children;Other relatives (25 y.o. daughter and cousin) Available Help at Discharge: Family;Available PRN/intermittently Type of Home: House (townhome)       Home Layout: Two level Alternate Level Stairs-Number of Steps: flight Alternate Level Stairs-Rails: Left Bathroom Shower/Tub: Chief Strategy Officer: Standard     Home Equipment: None      Lives With: Daughter;Family    Prior Functioning/Environment Level of Independence: Independent        Comments: works full time    OT Diagnosis: Generalized weakness   OT Problem List: Decreased activity tolerance;Other (comment) (slow speech)   OT Treatment/Interventions: Self-care/ADL training;Patient/family education    OT Goals(Current goals can be  found in the care plan section)    OT Frequency: Min 2X/week    End of Session Nurse Communication: Mobility status  Activity Tolerance: Patient limited by fatigue Patient left: in chair;with call bell/phone within reach;with family/visitor present   Time: 1217-1233 OT Time Calculation (min): 16 min Charges:  OT General Charges $OT Visit: 1 Procedure OT Evaluation $Initial OT Evaluation Tier I: 1 Procedure G-Codes:    Alba Cory 03/15/2015, 1:05 PM

## 2015-03-15 NOTE — Progress Notes (Signed)
STROKE TEAM PROGRESS NOTE   SUBJECTIVE (INTERVAL HISTORY) The patient's mother is at the bedside. The patient feels her speech is improving but is still not back to baseline. She states she woke up with a headache today but this has improved.   OBJECTIVE Temp:  [97.9 F (36.6 C)-98.7 F (37.1 C)] 98 F (36.7 C) (10/15 0931) Pulse Rate:  [68-86] 84 (10/15 0931) Cardiac Rhythm:  [-] Normal sinus rhythm (10/15 0700) Resp:  [16-20] 16 (10/15 0931) BP: (106-141)/(70-96) 109/70 mmHg (10/15 0931) SpO2:  [99 %-100 %] 99 % (10/15 0931)   Recent Labs Lab 03/15/15 0640 03/15/15 1116  GLUCAP 98 92    Recent Labs Lab 03/14/15 0106 03/14/15 0113 03/14/15 0930 03/15/15 0748  NA 140 141  --  138  K 3.6 3.5  --  5.7*  CL 104 105  --  107  CO2 24  --   --  19*  GLUCOSE 117* 117*  --  99  BUN 16 18  --  13  CREATININE 1.06* 1.10* 0.98 0.78  CALCIUM 9.6  --   --  9.4    Recent Labs Lab 03/14/15 0106  AST 17  ALT 18  ALKPHOS 59  BILITOT 0.5  PROT 7.2  ALBUMIN 4.0    Recent Labs Lab 03/14/15 0106 03/14/15 0113 03/14/15 0930  WBC 5.8  --  5.4  NEUTROABS 1.9  --   --   HGB 14.5 16.0* 14.0  HCT 44.2 47.0* 42.9  MCV 87.0  --  87.0  PLT 207  --  215   No results for input(s): CKTOTAL, CKMB, CKMBINDEX, TROPONINI in the last 168 hours.  Recent Labs  03/14/15 0106  LABPROT 13.3  INR 0.99    Recent Labs  03/14/15 0138  COLORURINE YELLOW  LABSPEC 1.005  PHURINE 6.0  GLUCOSEU NEGATIVE  HGBUR NEGATIVE  BILIRUBINUR NEGATIVE  KETONESUR NEGATIVE  PROTEINUR NEGATIVE  UROBILINOGEN 0.2  NITRITE NEGATIVE  LEUKOCYTESUR TRACE*       Component Value Date/Time   CHOL 265* 03/14/2015 0930   TRIG 71 03/14/2015 0930   HDL 81 03/14/2015 0930   CHOLHDL 3.3 03/14/2015 0930   VLDL 14 03/14/2015 0930   LDLCALC 170* 03/14/2015 0930   Lab Results  Component Value Date   HGBA1C 6.1* 03/14/2015      Component Value Date/Time   LABOPIA NONE DETECTED 03/14/2015 0138   COCAINSCRNUR NONE DETECTED 03/14/2015 0138   LABBENZ NONE DETECTED 03/14/2015 0138   AMPHETMU NONE DETECTED 03/14/2015 0138   THCU NONE DETECTED 03/14/2015 0138   LABBARB NONE DETECTED 03/14/2015 0138     Recent Labs Lab 03/14/15 0106  ETH <5    I have personally reviewed the radiological images below and agree with the radiology interpretations.  Ct Angio Head W/cm &/or Wo Cm  03/14/2015  IMPRESSION: Negative CTA of the head and neck.    Ct Head Wo Contrast  03/14/2015  IMPRESSION: Negative noncontrast CT appearance of the brain.    Mr Brain Wo Contrast  03/14/2015  IMPRESSION: 1. Acute ischemic nonhemorrhagic linear infarct involving the posterior left frontal lobe cortical gray matter and subcortical white matter. No associated mass effect. 2. Small remote right parietal cortical infarct with additional probable tiny remote left cerebellar infarcts. 3. Mild chronic small vessel ischemic disease.   2D Echocardiogram  - Left ventricle: The cavity size was normal. Systolic function was normal. The estimated ejection fraction was 50%. Wall motion was normal; there were no regional wall motion  abnormalities. Doppler parameters are consistent with abnormal left ventricular relaxation (grade 1 diastolic dysfunction). Doppler parameters are consistent with elevated ventricular end-diastolic filling pressure. - Aortic valve: Structurally normal valve. There was no regurgitation. - Mitral valve: Structurally normal valve. There was mild regurgitation. - Left atrium: The atrium was normal in size. - Right ventricle: The cavity size was normal. Wall thickness was normal. Systolic function was normal. - Right atrium: The atrium was normal in size. - Tricuspid valve: There was mild regurgitation. - Pulmonic valve: There was trivial regurgitation. - Inferior vena cava: The vessel was normal in size. The respirophasic diameter changes were in the normal range (>=  50%), consistent with normal central venous pressure. - Pericardium, extracardiac: The pericardium was normal in appearance. Impressions: - LVEF is mildly impaired with diffuse hypokinesis. Abnormal relaxation with mildly elevated filling pressures. Mild MR and TR.  TCD bubble study - negative for PFO  Hypercoagulable and autoimmune work up - pending  PHYSICAL EXAM  Temp:  [97.9 F (36.6 C)-98.7 F (37.1 C)] 98 F (36.7 C) (10/15 0931) Pulse Rate:  [68-86] 84 (10/15 0931) Resp:  [16-20] 16 (10/15 0931) BP: (106-141)/(70-96) 109/70 mmHg (10/15 0931) SpO2:  [99 %-100 %] 99 % (10/15 0931)  General - Well nourished, well developed, mild lethargy.  Ophthalmologic - Sharp disc margins OU.  Cardiovascular - Regular rate and rhythm with no murmur.  Neck - supple, no carotid bruits  Mental Status -  Level of arousal and orientation to time, place, and person were intact. Language exam showed mild expressive aphasia, and mild deficit with repetition, but intact with naming and comprehension. Fund of Knowledge was assessed and was intact.  Cranial Nerves II - XII - II - Visual field intact OU. III, IV, VI - Extraocular movements intact. V - Facial sensation intact bilaterally. VII - mild right nasolabial fold flattening. VIII - Hearing & vestibular intact bilaterally. X - Palate elevates symmetrically. XI - Chin turning & shoulder shrug intact bilaterally. XII - Tongue protrusion intact.  Motor Strength - The patient's strength was normal in all extremities and pronator drift was absent.  Bulk was normal and fasciculations were absent.   Motor Tone - Muscle tone was assessed at the neck and appendages and was normal.  Reflexes - The patient's reflexes were symmetrical in all extremities and she had no pathological reflexes.  Sensory - Light touch, temperature/pinprick were assessed and were symmetrical.    Coordination - The patient had normal movements in the hands  with no ataxia or dysmetria.  Tremor was absent.  Gait and Station - The patient's transfers, posture, gait, station, and turns were observed as normal.   ASSESSMENT/PLAN [name] is a 44 y.o. female with history of HTN, HLD admitted for word finding difficulties. Symptoms improving. Pt denies OCP use or smoking hx.   Stroke:  Dominant left MCA small cortica infarct, embolic pattern secondary to unknown source  MRI  Left MCA cortical small linear infarct  CTA head and neck negative for dissection  2D Echo EF 50%, no SOE  TCD bubble study negative for PFO  Recommend TEE to rule out cardiac source of emboli - planned for Monday  Hypercoagulable and autoimmune work up pending  LDL 170  HgbA1c 6.1  lovenox for VTE prophylaxis  Diet Heart Room service appropriate?: Yes; Fluid consistency:: Thin   no antithrombotic prior to admission, now on aspirin 325 mg orally every day.   Patient counseled to be compliant with her antithrombotic  medications  Ongoing aggressive stroke risk factor management  Hypertension  Home meds:   none Permissive hypertension (OK if <220/120) for 24-48 hours post stroke and then gradually normalized within 5-7 days. Currently on none  Stable  Hyperlipidemia  Home meds:  none   Currently on lipitor 40  LDL 170, goal < 70  Continue statin at discharge  Other Stroke Risk Factors    Other Active Problems  No OCP use  No hx of smoking  Hyperkalemia - 5.7 today - the patient has received Kayexalate - CMP ordered for tomorrow a.m.  Other Pertinent History    Hospital day # 1  Marvel Plan, MD PhD Stroke Neurology 03/15/2015 9:57 PM     To contact Stroke Continuity provider, please refer to [email or web address]. After hours, contact General Neurology

## 2015-03-16 LAB — COMPREHENSIVE METABOLIC PANEL
ALBUMIN: 3.3 g/dL — AB (ref 3.5–5.0)
ALK PHOS: 56 U/L (ref 38–126)
ALT: 17 U/L (ref 14–54)
ANION GAP: 8 (ref 5–15)
AST: 24 U/L (ref 15–41)
BILIRUBIN TOTAL: 1 mg/dL (ref 0.3–1.2)
BUN: 8 mg/dL (ref 6–20)
CALCIUM: 9.1 mg/dL (ref 8.9–10.3)
CO2: 28 mmol/L (ref 22–32)
Chloride: 101 mmol/L (ref 101–111)
Creatinine, Ser: 0.86 mg/dL (ref 0.44–1.00)
GFR calc non Af Amer: >60 mL/min (ref >60)
GLUCOSE: 94 mg/dL (ref 65–99)
Potassium: 4.3 mmol/L (ref 3.5–5.1)
Sodium: 137 mmol/L (ref 135–145)
Total Protein: 6.4 g/dL — ABNORMAL LOW (ref 6.5–8.1)

## 2015-03-16 LAB — RHEUMATOID FACTOR: Rhuematoid fact SerPl-aCnc: <10 IU/mL (ref 0.0–13.9)

## 2015-03-16 LAB — GLUCOSE, CAPILLARY: Glucose-Capillary: 110 mg/dL — ABNORMAL HIGH (ref 65–99)

## 2015-03-16 LAB — ANGIOTENSIN CONVERTING ENZYME: Angiotensin-Converting Enzyme: 47 U/L (ref 14–82)

## 2015-03-16 LAB — HOMOCYSTEINE: HOMOCYSTEINE-NORM: 7.5 umol/L (ref 0.0–15.0)

## 2015-03-16 MED ORDER — SENNOSIDES-DOCUSATE SODIUM 8.6-50 MG PO TABS
1.0000 | ORAL_TABLET | Freq: Two times a day (BID) | ORAL | Status: DC
  Administered 2015-03-16 – 2015-03-17 (×3): 1 via ORAL
  Filled 2015-03-16 (×3): qty 1

## 2015-03-16 NOTE — Progress Notes (Addendum)
[facility] PROGRESS NOTE  Amber [name] WJX:[id number] DOB: 06/18/1970 DOA: 03/14/2015 PCP: Morrie Sheldon, NP  Length of stay: 2   Assessment/Plan: Principal Problem:   Acute ischemic stroke (HCC) Active Problems:   Hyperlipidemia   Essential hypertension   Stroke [facility])   Dysarthria    Brief summary 44 y.o. female without significant PMH who reports that this evening while doing work at home she developed pain on the left side of her face. It was so severe that she was unable to continue her work. She went to The mirror and noted that her face did not look right. When she went to talk to her cousin her speech would not come out normally. EMS was called at that time and the patient was brought in as a code stroke. Initial NIHSS of 3. BP elevated.  Assessment and plan Dominant left MCA small cortica infarct, embolic pattern secondary to unknown source  MRI Left MCA cortical small linear infarct  CTA head and neck negative for dissection  2D Echo EF 50%, no SOE  TCD bubble study negative for PFO  Neurology Recommend TEE to rule out cardiac source of emboli, to be done on 10/17, will keep nothing by mouth after midnight. Hypercoagulable and autoimmune work up pending  LDL 170, hemoglobin A1c 6.1, continue aspirin 325 mg by mouth daily for now, continue statin  Patient will need outpatient speech therapy evaluation, PT recommends outpatient physical therapy  Transcranial Doppler without any PFO at rest  Headache-likely secondary to stroke, insomnia, patient started on Restoril and Percocet  Dyslipidemia continue statin  Hyperkalemia treated with Kayexalate, potassium is normal today  Constipation-constipation protocol  DVT prophylaxsis Lovenox  Code Status:      Code Status Orders        Start     Ordered   03/14/15 0558  Full code   Continuous     03/14/15 0557     Family Communication: family updated about patient's clinical  progress Disposition Plan:  Anticipate discharge on Monday after TEE    Consultants:  Neurology  Procedures:  1  Antibiotics: Anti-infectives    None         HPI/Subjective: Resting comfortably, complains of some constipation this morning  Objective: Filed Vitals:   03/15/15 1457 03/15/15 1813 03/15/15 2055 03/16/15 0147  BP: 120/88 128/90  118/87  Pulse: 92 81  69  Temp: 98 F (36.7 C) 97.5 F (36.4 C)  98.3 F (36.8 C)  TempSrc: Oral Oral  Oral  Resp: 20 20 18 16   Height:      Weight:      SpO2: 98% 100%  100%   No intake or output data in the 24 hours ending 03/16/15 2130  Exam:  General: No acute respiratory distress Lungs: Clear to auscultation bilaterally without wheezes or crackles Cardiovascular: Regular rate and rhythm without murmur gallop or rub normal S1 and S2 Abdomen: Nontender, nondistended, soft, bowel sounds positive, no rebound, no ascites, no appreciable mass NeurologicThe patient's strength was normal in all extremities and pronator drift was absent. Bulk was normal and fasciculations were absent.  Motor Tone - Muscle tone was assessed at the neck and appendages and was normal     Data Review   Micro Results No results found for this or any previous visit (from the past 240 hour(s)).  Radiology Reports Ct Angio Head W/cm &/or Wo Cm  03/14/2015  CLINICAL DATA:  Acute cerebral infarct. EXAM: CT ANGIOGRAPHY  HEAD AND NECK TECHNIQUE: Multidetector CT imaging of the head and neck was performed using the standard protocol during bolus administration of intravenous contrast. Multiplanar CT image reconstructions and MIPs were obtained to evaluate the vascular anatomy. Carotid stenosis measurements (when applicable) are obtained utilizing NASCET criteria, using the distal internal carotid diameter as the denominator. CONTRAST:  80mL OMNIPAQUE IOHEXOL 350 MG/ML SOLN COMPARISON:  None. FINDINGS: CTA NECK Aortic arch: No aneurysm, dissection, or  wall thickening. Two vessel branching. Right carotid system: Widely patent. No atheromatous changes or dissection. Left carotid system: Widely patent. No atheromatous changes or dissection. No evidence of vasculopathy in the carotid systems. Vertebral arteries:Symmetric vertebral arteries. Limited evaluation of the left V1 and proximal V2 segments due to intravenous contrast streak artifact. No evidence of stenosis or vasculopathy. Skeleton: Negative Other neck: No incidentally detected mass or concerning nodes in the neck. Clear apical lungs. CTA HEAD Limited by venous contamination. Anterior circulation: Symmetric carotid arteries. No significant communicating arteries. No major vessel occlusion or flow limiting stenosis. No indication of atherosclerosis or vasculopathy. No aneurysm. Posterior circulation: Symmetric vertebral arteries. Symmetric vertebral and basilar branching. No major vessel occlusion, flow limiting stenosis, or evidence of atherosclerosis or vasculopathy. No aneurysm. Venous sinuses: Patent Anatomic variants: Incomplete circle-of-Willis with no significant communicating arteries. Delayed phase: No parenchymal enhancement or mass lesion detected. IMPRESSION: Negative CTA of the head and neck. Electronically Signed   By: Marnee Spring M.D.   On: 03/14/2015 08:01   Ct Head Wo Contrast  03/14/2015  ADDENDUM REPORT: 03/14/2015 01:21 ADDENDUM: Study discussed by telephone with Dr. Thana Farr on 03/14/2015 at 0120 hours. Electronically Signed   By: Odessa Fleming M.D.   On: 03/14/2015 01:21  03/14/2015  CLINICAL DATA:  44 year old female with slurred speech and right arm weakness. Code stroke. Initial encounter. EXAM: CT HEAD WITHOUT CONTRAST TECHNIQUE: Contiguous axial images were obtained from the base of the skull through the vertex without intravenous contrast. COMPARISON:  None. FINDINGS: Visualized paranasal sinuses and mastoids are clear. No osseous abnormality identified. Visualized  orbit soft tissues are within normal limits. Visualized scalp soft tissues are within normal limits. Mildly decreased for age cerebral volume. No midline shift, mass effect, or evidence of intracranial mass lesion. No ventriculomegaly. No acute intracranial hemorrhage identified. No suspicious intracranial vascular hyperdensity. No cortically based acute infarct identified. IMPRESSION: Negative noncontrast CT appearance of the brain. Electronically Signed: By: Odessa Fleming M.D. On: 03/14/2015 01:17   Ct Angio Neck W/cm &/or Wo/cm  03/14/2015  CLINICAL DATA:  Acute cerebral infarct. EXAM: CT ANGIOGRAPHY HEAD AND NECK TECHNIQUE: Multidetector CT imaging of the head and neck was performed using the standard protocol during bolus administration of intravenous contrast. Multiplanar CT image reconstructions and MIPs were obtained to evaluate the vascular anatomy. Carotid stenosis measurements (when applicable) are obtained utilizing NASCET criteria, using the distal internal carotid diameter as the denominator. CONTRAST:  80mL OMNIPAQUE IOHEXOL 350 MG/ML SOLN COMPARISON:  None. FINDINGS: CTA NECK Aortic arch: No aneurysm, dissection, or wall thickening. Two vessel branching. Right carotid system: Widely patent. No atheromatous changes or dissection. Left carotid system: Widely patent. No atheromatous changes or dissection. No evidence of vasculopathy in the carotid systems. Vertebral arteries:Symmetric vertebral arteries. Limited evaluation of the left V1 and proximal V2 segments due to intravenous contrast streak artifact. No evidence of stenosis or vasculopathy. Skeleton: Negative Other neck: No incidentally detected mass or concerning nodes in the neck. Clear apical lungs. CTA HEAD Limited by venous contamination. Anterior circulation:  Symmetric carotid arteries. No significant communicating arteries. No major vessel occlusion or flow limiting stenosis. No indication of atherosclerosis or vasculopathy. No aneurysm.  Posterior circulation: Symmetric vertebral arteries. Symmetric vertebral and basilar branching. No major vessel occlusion, flow limiting stenosis, or evidence of atherosclerosis or vasculopathy. No aneurysm. Venous sinuses: Patent Anatomic variants: Incomplete circle-of-Willis with no significant communicating arteries. Delayed phase: No parenchymal enhancement or mass lesion detected. IMPRESSION: Negative CTA of the head and neck. Electronically Signed   By: Marnee Spring M.D.   On: 03/14/2015 08:01   Mr Brain Wo Contrast  03/14/2015  CLINICAL DATA:  Initial valuation for acute episode of dysarthria EXAM: MRI HEAD WITHOUT CONTRAST TECHNIQUE: Multiplanar, multiecho pulse sequences of the brain and surrounding structures were obtained without intravenous contrast. COMPARISON:  Prior noncontrast head CT from earlier the same day. FINDINGS: Cerebral volume within normal limits for patient age. Minimal patchy T2/FLAIR hyperintensity within the periventricular white matter noted, likely related to very mild chronic small vessel ischemic disease. Small focus of FLAIR hyperintensity within the cortex of the high right parietal lobe likely reflects a small remote cortical infarct (series 10, image 16). Probable few scattered small remote left cerebellar infarcts noted as well. There is linear focus of restricted diffusion involving the posterior cortex and underlying subcortical white matter in the posterior left frontal region (series 3, image 30). Corresponding signal loss seen on ADC map (series 300, image 31). No other areas of acute infarction identified. Gray-white matter differentiation otherwise maintained. Normal intravascular flow voids are preserved. No acute or chronic intracranial hemorrhage. No mass lesion, midline shift, or mass effect. No hydrocephalus. No extra-axial fluid collection. Craniocervical junction within normal limits. Pituitary gland normal. No acute abnormality about the orbits. Mild  mucosal thickening within the ethmoidal air cells. Paranasal sinuses are otherwise clear. No mastoid effusion. Inner ear structures normal. Bone marrow signal intensity within normal limits. No scalp soft tissue abnormality. IMPRESSION: 1. Acute ischemic nonhemorrhagic linear infarct involving the posterior left frontal lobe cortical gray matter and subcortical white matter. No associated mass effect. 2. Small remote right parietal cortical infarct with additional probable tiny remote left cerebellar infarcts. 3. Mild chronic small vessel ischemic disease. Electronically Signed   By: Rise Mu M.D.   On: 03/14/2015 03:33     CBC  Recent Labs Lab 03/14/15 0106 03/14/15 0113 03/14/15 0930  WBC 5.8  --  5.4  HGB 14.5 16.0* 14.0  HCT 44.2 47.0* 42.9  PLT 207  --  215  MCV 87.0  --  87.0  MCH 28.5  --  28.4  MCHC 32.8  --  32.6  RDW 12.8  --  12.9  LYMPHSABS 3.4  --   --   MONOABS 0.4  --   --   EOSABS 0.1  --   --   BASOSABS 0.0  --   --     Chemistries   Recent Labs Lab 03/14/15 0106 03/14/15 0113 03/14/15 0930 03/15/15 0748 03/16/15 0408  NA 140 141  --  138 137  K 3.6 3.5  --  5.7* 4.3  CL 104 105  --  107 101  CO2 24  --   --  19* 28  GLUCOSE 117* 117*  --  99 94  BUN 16 18  --  13 8  CREATININE 1.06* 1.10* 0.98 0.78 0.86  CALCIUM 9.6  --   --  9.4 9.1  AST 17  --   --   --  24  ALT 18  --   --   --  17  ALKPHOS 59  --   --   --  56  BILITOT 0.5  --   --   --  1.0   ------------------------------------------------------------------------------------------------------------------ estimated creatinine clearance is 80.3 mL/min (by C-G formula based on Cr of 0.86). ------------------------------------------------------------------------------------------------------------------  Recent Labs  03/14/15 0930  HGBA1C 6.1*   ------------------------------------------------------------------------------------------------------------------  Recent Labs   03/14/15 0930  CHOL 265*  HDL 81  LDLCALC 170*  TRIG 71  CHOLHDL 3.3   ------------------------------------------------------------------------------------------------------------------ No results for input(s): TSH, T4TOTAL, T3FREE, THYROIDAB in the last 72 hours.  Invalid input(s): FREET3 ------------------------------------------------------------------------------------------------------------------ No results for input(s): VITAMINB12, FOLATE, FERRITIN, TIBC, IRON, RETICCTPCT in the last 72 hours.  Coagulation profile  Recent Labs Lab 03/14/15 0106  INR 0.99    No results for input(s): DDIMER in the last 72 hours.  Cardiac Enzymes No results for input(s): CKMB, TROPONINI, MYOGLOBIN in the last 168 hours.  Invalid input(s): CK ------------------------------------------------------------------------------------------------------------------ Invalid input(s): POCBNP   CBG:  Recent Labs Lab 03/15/15 0640 03/15/15 1116 03/15/15 1618 03/15/15 2155 03/16/15 0626  GLUCAP 98 92 96 119* 110*       Studies: No results found.    Lab Results  Component Value Date   HGBA1C 6.1* 03/14/2015   Lab Results  Component Value Date   LDLCALC 170* 03/14/2015   CREATININE 0.86 03/16/2015       Scheduled Meds: . aspirin  300 mg Rectal Daily   Or  . aspirin  325 mg Oral Daily  . atorvastatin  40 mg Oral q1800  . enoxaparin (LOVENOX) injection  40 mg Subcutaneous Daily  . senna-docusate  1 tablet Oral BID   Continuous Infusions:   Principal Problem:   Acute ischemic stroke [facility]) Active Problems:   Hyperlipidemia   Essential hypertension   Stroke [facility])   Dysarthria    Time spent: 45 minutes   [facility]  [facility] Pager [phone]. If 7PM-7AM, please contact night-coverage at [email or web address], password [facility] 03/16/2015, 9:29 AM  LOS: 2 days

## 2015-03-16 NOTE — Progress Notes (Signed)
STROKE TEAM PROGRESS NOTE   SUBJECTIVE (INTERVAL HISTORY) No family members present today. The patient is still having some speech difficulties. She is scheduled for a TEE tomorrow. Daughter and sister came in at the end of rounding.   OBJECTIVE Temp:  [97.5 F (36.4 C)-98.3 F (36.8 C)] 98.2 F (36.8 C) (10/16 1006) Pulse Rate:  [69-92] 83 (10/16 1006) Cardiac Rhythm:  [-] Normal sinus rhythm (10/16 0700) Resp:  [16-20] 20 (10/16 1006) BP: (118-128)/(87-91) 126/91 mmHg (10/16 1006) SpO2:  [98 %-100 %] 100 % (10/16 1006)   Recent Labs Lab 03/15/15 0640 03/15/15 1116 03/15/15 1618 03/15/15 2155 03/16/15 0626  GLUCAP 98 92 96 119* 110*    Recent Labs Lab 03/14/15 0106 03/14/15 0113 03/14/15 0930 03/15/15 0748 03/16/15 0408  NA 140 141  --  138 137  K 3.6 3.5  --  5.7* 4.3  CL 104 105  --  107 101  CO2 24  --   --  19* 28  GLUCOSE 117* 117*  --  99 94  BUN 16 18  --  13 8  CREATININE 1.06* 1.10* 0.98 0.78 0.86  CALCIUM 9.6  --   --  9.4 9.1    Recent Labs Lab 03/14/15 0106 03/16/15 0408  AST 17 24  ALT 18 17  ALKPHOS 59 56  BILITOT 0.5 1.0  PROT 7.2 6.4*  ALBUMIN 4.0 3.3*    Recent Labs Lab 03/14/15 0106 03/14/15 0113 03/14/15 0930  WBC 5.8  --  5.4  NEUTROABS 1.9  --   --   HGB 14.5 16.0* 14.0  HCT 44.2 47.0* 42.9  MCV 87.0  --  87.0  PLT 207  --  215   No results for input(s): CKTOTAL, CKMB, CKMBINDEX, TROPONINI in the last 168 hours.  Recent Labs  03/14/15 0106  LABPROT 13.3  INR 0.99    Recent Labs  03/14/15 0138  COLORURINE YELLOW  LABSPEC 1.005  PHURINE 6.0  GLUCOSEU NEGATIVE  HGBUR NEGATIVE  BILIRUBINUR NEGATIVE  KETONESUR NEGATIVE  PROTEINUR NEGATIVE  UROBILINOGEN 0.2  NITRITE NEGATIVE  LEUKOCYTESUR TRACE*       Component Value Date/Time   CHOL 265* 03/14/2015 0930   TRIG 71 03/14/2015 0930   HDL 81 03/14/2015 0930   CHOLHDL 3.3 03/14/2015 0930   VLDL 14 03/14/2015 0930   LDLCALC 170* 03/14/2015 0930   Lab  Results  Component Value Date   HGBA1C 6.1* 03/14/2015      Component Value Date/Time   LABOPIA NONE DETECTED 03/14/2015 0138   COCAINSCRNUR NONE DETECTED 03/14/2015 0138   LABBENZ NONE DETECTED 03/14/2015 0138   AMPHETMU NONE DETECTED 03/14/2015 0138   THCU NONE DETECTED 03/14/2015 0138   LABBARB NONE DETECTED 03/14/2015 0138     Recent Labs Lab 03/14/15 0106  ETH <5    I have personally reviewed the radiological images below and agree with the radiology interpretations.  Ct Angio Head W/cm &/or Wo Cm  03/14/2015  IMPRESSION: Negative CTA of the head and neck.    Ct Head Wo Contrast  03/14/2015  IMPRESSION: Negative noncontrast CT appearance of the brain.    Mr Brain Wo Contrast  03/14/2015  IMPRESSION: 1. Acute ischemic nonhemorrhagic linear infarct involving the posterior left frontal lobe cortical gray matter and subcortical white matter. No associated mass effect. 2. Small remote right parietal cortical infarct with additional probable tiny remote left cerebellar infarcts. 3. Mild chronic small vessel ischemic disease.   2D Echocardiogram  - Left ventricle: The  cavity size was normal. Systolic function was normal. The estimated ejection fraction was 50%. Wall motion was normal; there were no regional wall motion abnormalities. Doppler parameters are consistent with abnormal left ventricular relaxation (grade 1 diastolic dysfunction). Doppler parameters are consistent with elevated ventricular end-diastolic filling pressure. - Aortic valve: Structurally normal valve. There was no regurgitation. - Mitral valve: Structurally normal valve. There was mild regurgitation. - Left atrium: The atrium was normal in size. - Right ventricle: The cavity size was normal. Wall thickness was normal. Systolic function was normal. - Right atrium: The atrium was normal in size. - Tricuspid valve: There was mild regurgitation. - Pulmonic valve: There was trivial  regurgitation. - Inferior vena cava: The vessel was normal in size. The respirophasic diameter changes were in the normal range (>= 50%), consistent with normal central venous pressure. - Pericardium, extracardiac: The pericardium was normal in appearance. Impressions: - LVEF is mildly impaired with diffuse hypokinesis. Abnormal relaxation with mildly elevated filling pressures. Mild MR and TR.  TCD bubble study - negative for PFO  Hypercoagulable and autoimmune work up - pending  PHYSICAL EXAM  Temp:  [97.5 F (36.4 C)-98.3 F (36.8 C)] 98.2 F (36.8 C) (10/16 1006) Pulse Rate:  [69-92] 83 (10/16 1006) Resp:  [16-20] 20 (10/16 1006) BP: (118-128)/(87-91) 126/91 mmHg (10/16 1006) SpO2:  [98 %-100 %] 100 % (10/16 1006)  General - Well nourished, well developed, mild lethargy.  Ophthalmologic - Sharp disc margins OU.  Cardiovascular - Regular rate and rhythm with no murmur.  Neck - supple, no carotid bruits  Mental Status -  Level of arousal and orientation to time, place, and person were intact. Language exam showed mild expressive aphasia, and mild deficit with repetition, but intact with naming and comprehension. Fund of Knowledge was assessed and was intact.  Cranial Nerves II - XII - II - Visual field intact OU. III, IV, VI - Extraocular movements intact. V - Facial sensation intact bilaterally. VII - mild right nasolabial fold flattening. VIII - Hearing & vestibular intact bilaterally. X - Palate elevates symmetrically. XI - Chin turning & shoulder shrug intact bilaterally. XII - Tongue protrusion intact.  Motor Strength - The patient's strength was normal in all extremities and pronator drift was absent.  Bulk was normal and fasciculations were absent.   Motor Tone - Muscle tone was assessed at the neck and appendages and was normal.  Reflexes - The patient's reflexes were symmetrical in all extremities and she had no pathological  reflexes.  Sensory - Light touch, temperature/pinprick were assessed and were symmetrical.    Coordination - The patient had normal movements in the hands with no ataxia or dysmetria.  Tremor was absent.  Gait and Station - The patient's transfers, posture, gait, station, and turns were observed as normal.   ASSESSMENT/PLAN [name] is a 44 y.o. female with history of HTN, HLD admitted for word finding difficulties. Symptoms improving. Pt denies OCP use or smoking hx.   Stroke:  Dominant left MCA small cortica infarct, embolic pattern secondary to unknown source  MRI  Left MCA cortical small linear infarct  CTA head and neck negative for dissection  2D Echo EF 50%, no SOE  TCD bubble study negative for PFO  Recommend TEE to rule out cardiac source of emboli - planned for Monday - NPO after midnight  Hypercoagulable and autoimmune work up pending  LDL 170  HgbA1c 6.1  lovenox for VTE prophylaxis Diet Heart Room service appropriate?: Yes; Fluid  consistency:: Thin  Diet NPO time specified   no antithrombotic prior to admission, now on aspirin 325 mg orally every day.   Patient counseled to be compliant with her antithrombotic medications  Ongoing aggressive stroke risk factor management  Hypertension  Home meds:   none Permissive hypertension (OK if <220/120) for 24-48 hours post stroke and then gradually normalized within 5-7 days. Currently on none  Stable  Hyperlipidemia  Home meds:  none   Currently on lipitor 40  LDL 170, goal < 70  Continue statin at discharge  Other Stroke Risk Factors    Other Active Problems  No OCP use  No hx of smoking  Hyperkalemia - 5.7 Saturday - the patient received Kayexalate - Sunday potassium 4.3  Other Pertinent History    Hospital day # 2  Marvel Plan, MD PhD Stroke Neurology 03/16/2015 10:12 PM     To contact Stroke Continuity provider, please refer to [email or web address]. After hours, contact  General Neurology

## 2015-03-17 ENCOUNTER — Encounter (HOSPITAL_COMMUNITY)
Admission: EM | Disposition: A | Discharge status: Home / Self Care | Payer: Self-pay | Source: Home / Self Care | Attending: Internal Medicine

## 2015-03-17 ENCOUNTER — Inpatient Hospital Stay (HOSPITAL_COMMUNITY): Payer: Medicaid Other

## 2015-03-17 ENCOUNTER — Encounter (HOSPITAL_COMMUNITY): Payer: Self-pay | Admitting: *Deleted

## 2015-03-17 DIAGNOSIS — I639 Cerebral infarction, unspecified: Secondary | ICD-10-CM

## 2015-03-17 HISTORY — PX: TEE WITHOUT CARDIOVERSION: SHX5443

## 2015-03-17 HISTORY — PX: EP IMPLANTABLE DEVICE: SHX172B

## 2015-03-17 LAB — ANTI-DNA ANTIBODY, DOUBLE-STRANDED: DS DNA AB: 1 [IU]/mL (ref 0–9)

## 2015-03-17 LAB — SICKLE CELL SCREEN: SICKLE CELL SCREEN: NEGATIVE

## 2015-03-17 LAB — MPO/PR-3 (ANCA) ANTIBODIES

## 2015-03-17 LAB — SJOGRENS SYNDROME-B EXTRACTABLE NUCLEAR ANTIBODY

## 2015-03-17 LAB — BETA-2-GLYCOPROTEIN I ABS, IGG/M/A
Beta-2 Glyco I IgG: <9 GPI IgG units (ref 0–20)
Beta-2-Glycoprotein I IgA: <9 GPI IgA units (ref 0–25)
Beta-2-Glycoprotein I IgM: <9 GPI IgM units (ref 0–32)

## 2015-03-17 LAB — SJOGRENS SYNDROME-A EXTRACTABLE NUCLEAR ANTIBODY: SSA (RO) (ENA) ANTIBODY, IGG: 0.2 AI (ref 0.0–0.9)

## 2015-03-17 LAB — ANTINUCLEAR ANTIBODIES, IFA: ANA Ab, IFA: NEGATIVE

## 2015-03-17 SURGERY — ECHOCARDIOGRAM, TRANSESOPHAGEAL
Anesthesia: Moderate Sedation

## 2015-03-17 SURGERY — LOOP RECORDER INSERTION

## 2015-03-17 MED ORDER — LIDOCAINE-EPINEPHRINE 1 %-1:100000 IJ SOLN
INTRAMUSCULAR | Status: AC
  Filled 2015-03-17: qty 1

## 2015-03-17 MED ORDER — ASPIRIN 325 MG PO TABS: 325.0000 mg | ORAL_TABLET | Freq: Every day | ORAL | Status: DC

## 2015-03-17 MED ORDER — ATORVASTATIN CALCIUM 40 MG PO TABS: 40.0000 mg | ORAL_TABLET | Freq: Every day | ORAL | Status: DC

## 2015-03-17 MED ORDER — MIDAZOLAM HCL 10 MG/2ML IJ SOLN
INTRAMUSCULAR | Status: DC | PRN
  Administered 2015-03-17: 2 mg via INTRAVENOUS
  Administered 2015-03-17: 1 mg via INTRAVENOUS
  Administered 2015-03-17: 2 mg via INTRAVENOUS

## 2015-03-17 MED ORDER — PNEUMOCOCCAL VAC POLYVALENT 25 MCG/0.5ML IJ INJ
0.5000 mL | INJECTION | INTRAMUSCULAR | Status: AC
Start: 2015-03-18 — End: 2015-03-17
  Administered 2015-03-17: 0.5 mL via INTRAMUSCULAR
  Filled 2015-03-17: qty 0.5

## 2015-03-17 MED ORDER — BUTAMBEN-TETRACAINE-BENZOCAINE 2-2-14 % EX AERO
INHALATION_SPRAY | CUTANEOUS | Status: DC | PRN
  Administered 2015-03-17: 2 via TOPICAL

## 2015-03-17 MED ORDER — FENTANYL CITRATE (PF) 100 MCG/2ML IJ SOLN
INTRAMUSCULAR | Status: DC | PRN
  Administered 2015-03-17 (×2): 25 ug via INTRAVENOUS

## 2015-03-17 MED ORDER — SENNOSIDES-DOCUSATE SODIUM 8.6-50 MG PO TABS: 1.0000 | ORAL_TABLET | Freq: Two times a day (BID) | ORAL | Status: DC

## 2015-03-17 MED ORDER — SODIUM CHLORIDE 0.9 % IV SOLN: INTRAVENOUS | Status: DC

## 2015-03-17 MED ORDER — MIDAZOLAM HCL 5 MG/ML IJ SOLN
INTRAMUSCULAR | Status: AC
Start: 2015-03-17 — End: 2015-03-17
  Filled 2015-03-17: qty 2

## 2015-03-17 MED ORDER — FENTANYL CITRATE (PF) 100 MCG/2ML IJ SOLN
INTRAMUSCULAR | Status: AC
  Filled 2015-03-17: qty 2

## 2015-03-17 MED ORDER — LIDOCAINE-EPINEPHRINE 1 %-1:100000 IJ SOLN
INTRAMUSCULAR | Status: DC | PRN
  Administered 2015-03-17: 20 mL

## 2015-03-17 SURGICAL SUPPLY — 2 items
LOOP REVEAL LINQSYS (Prosthesis & Implant Heart) ×2 IMPLANT
PACK LOOP INSERTION (CUSTOM PROCEDURE TRAY) ×2 IMPLANT

## 2015-03-17 NOTE — Discharge Instructions (Signed)
Feet Together, Varied Arm Positions - Eyes Closed    Stand with feet together and arms out or as needed for balance.. Close eyes and visualize upright position. Hold 10-15 seconds. Repeat __3__ times per session. Do _1-2_ sessions per day.  Copyright  VHI. All rights reserved.   Feet Heel-Toe "Tandem", Varied Arm Positions - Eyes Open    With eyes open, right foot directly in front of the other, arms out, look straight ahead at a stationary object. Hold _10___ seconds. Repeat __3__ times each foot forward. Do _1-2___ sessions per day.  Copyright  VHI. All rights reserved.   Single Leg - Eyes Open    Holding support, lift right leg while maintaining balance over other leg. Progress to removing hands from support surface for longer periods of time. Hold__10-15__ seconds. Repeat __3__ times on each leg. Do __1-2__ sessions per day.  Copyright  VHI. All rights reserved.   Aspirin What is this medicine? ASPIRIN (AS pir in) is a pain reliever. It is used to treat mild pain and fever. This medicine is also used as directed by a doctor to prevent and to treat heart attacks, to prevent strokes, and to treat arthritis or inflammation. This medicine may be used for other purposes; ask your health care provider or pharmacist if you have questions. What should I tell my health care provider before I take this medicine? They need to know if you have any of these conditions: -anemia -asthma -bleeding problems -child with chickenpox, the flu, or other viral infection -diabetes -gout -if you frequently drink alcohol containing drinks -kidney disease -liver disease -low level of vitamin K -lupus -smoke tobacco -stomach ulcers or other problems -an unusual or allergic reaction to aspirin, tartrazine dye, other medicines, dyes, or preservatives -pregnant or trying to get pregnant -breast-feeding How should I use this medicine? Take this medicine by mouth. Chew it completely before  swallowing. Follow the directions on the package or prescription label. Do not take your medicine more often than directed. Talk to your pediatrician regarding the use of this medicine in children. While this drug may be prescribed for children as young as 29 years of age for selected conditions, precautions do apply. Children and teenagers should not use this medicine to treat chicken pox or flu symptoms unless directed by a doctor. Patients over 4 years old may have a stronger reaction and need a smaller dose. Overdosage: If you think you have taken too much of this medicine contact a poison control center or emergency room at once. NOTE: This medicine is only for you. Do not share this medicine with others. What if I miss a dose? If you are taking this medicine on a regular schedule and miss a dose, take it as soon as you can. If it is almost time for your next dose, take only that dose. Do not take double or extra doses. What may interact with this medicine? Do not take this medicine with any of the following medications: -cidofovir -ketorolac -probenecid This medicine may also interact with the following medications: -alcohol -alendronate -bismuth subsalicylate -flavocoxid -herbal supplements like feverfew, garlic, ginger, ginkgo biloba, horse chestnut -medicines for diabetes or glaucoma like acetazolamide, methazolamide -medicines for gout -medicines that treat or prevent blood clots like enoxaparin, heparin, ticlopidine, warfarin -other aspirin and aspirin-like medicines -NSAIDs, medicines for pain and inflammation, like ibuprofen or naproxen -pemetrexed -sulfinpyrazone -varicella live vaccine This list may not describe all possible interactions. Give your health care provider a list of all  the medicines, herbs, non-prescription drugs, or dietary supplements you use. Also tell them if you smoke, drink alcohol, or use illegal drugs. Some items may interact with your medicine. What  should I watch for while using this medicine? If you are treating yourself for pain, tell your doctor or health care professional if the pain lasts more than 10 days, if it gets worse, or if there is a new or different kind of pain. Tell your doctor if you see redness or swelling. Also, check with your doctor if you have a fever that lasts for more than 3 days. Only take this medicine to prevent heart attacks or blood clotting if prescribed by your doctor or health care professional. Do not take aspirin or aspirin-like medicines with this medicine. Too much aspirin can be dangerous. Always read the labels carefully. This medicine can irritate your stomach or cause bleeding problems. Do not smoke cigarettes or drink alcohol while taking this medicine. Do not lie down for 30 minutes after taking this medicine to prevent irritation to your throat. If you are scheduled for any medical or dental procedure, tell your healthcare provider that you are taking this medicine. You may need to stop taking this medicine before the procedure. What side effects may I notice from receiving this medicine? Side effects that you should report to your doctor or health care professional as soon as possible: -allergic reactions like skin rash, itching or hives, swelling of the face, lips, or tongue -breathing problems -changes in hearing, ringing in the ears -confusion -general ill feeling or flu-like symptoms -pain on swallowing -redness, blistering, peeling or loosening of the skin, including inside the mouth or nose -signs and symptoms of bleeding such as bloody or black, tarry stools; red or dark-brown urine; spitting up blood or brown material that looks like coffee grounds; red spots on the skin; unusual bruising or bleeding from the eye, gums, or nose -trouble passing urine or change in the amount of urine -unusually weak or tired -yellowing of the eyes or skin Side effects that usually do not require medical  attention (report to your doctor or health care professional if they continue or are bothersome): -diarrhea or constipation -nausea, vomiting -stomach gas, heartburn This list may not describe all possible side effects. Call your doctor for medical advice about side effects. You may report side effects to FDA at 1-800-FDA-1088. Where should I keep my medicine? Keep out of the reach of children. Store at room temperature between 15 and 30 degrees C (59 and 86 degrees F). Protect from heat and moisture. Do not use this medicine if it has a strong vinegar smell. Throw away any unused medicine after the expiration date. NOTE: This sheet is a summary. It may not cover all possible information. If you have questions about this medicine, talk to your doctor, pharmacist, or health care provider.    2016, Elsevier/Gold Standard. (2012-09-06 15:34:27)  Atorvastatin tablets What is this medicine? ATORVASTATIN (a TORE va sta tin) is known as a HMG-CoA reductase inhibitor or 'statin'. It lowers the level of cholesterol and triglycerides in the blood. This drug may also reduce the risk of heart attack, stroke, or other health problems in patients with risk factors for heart disease. Diet and lifestyle changes are often used with this drug. This medicine may be used for other purposes; ask your health care provider or pharmacist if you have questions. What should I tell my health care provider before I take this medicine? They need to  know if you have any of these conditions: -frequently drink alcoholic beverages -history of stroke, TIA -kidney disease -liver disease -muscle aches or weakness -other medical condition -an unusual or allergic reaction to atorvastatin, other medicines, foods, dyes, or preservatives -pregnant or trying to get pregnant -breast-feeding How should I use this medicine? Take this medicine by mouth with a glass of water. Follow the directions on the prescription label. You can  take this medicine with or without food. Take your doses at regular intervals. Do not take your medicine more often than directed. Talk to your pediatrician regarding the use of this medicine in children. While this drug may be prescribed for children as young as 16 years old for selected conditions, precautions do apply. Overdosage: If you think you have taken too much of this medicine contact a poison control center or emergency room at once. NOTE: This medicine is only for you. Do not share this medicine with others. What if I miss a dose? If you miss a dose, take it as soon as you can. If it is almost time for your next dose, take only that dose. Do not take double or extra doses. What may interact with this medicine? Do not take this medicine with any of the following medications: -red yeast rice -telaprevir -telithromycin -voriconazole This medicine may also interact with the following medications: -alcohol -antiviral medicines for HIV or AIDS -boceprevir -certain antibiotics like clarithromycin, erythromycin, troleandomycin -certain medicines for cholesterol like fenofibrate or gemfibrozil -cimetidine -clarithromycin -colchicine -cyclosporine -digoxin -female hormones, like estrogens or progestins and birth control pills -grapefruit juice -medicines for fungal infections like fluconazole, itraconazole, ketoconazole -niacin -rifampin -spironolactone This list may not describe all possible interactions. Give your health care provider a list of all the medicines, herbs, non-prescription drugs, or dietary supplements you use. Also tell them if you smoke, drink alcohol, or use illegal drugs. Some items may interact with your medicine. What should I watch for while using this medicine? Visit your doctor or health care professional for regular check-ups. You may need regular tests to make sure your liver is working properly. Tell your doctor or health care professional right away if  you get any unexplained muscle pain, tenderness, or weakness, especially if you also have a fever and tiredness. Your doctor or health care professional may tell you to stop taking this medicine if you develop muscle problems. If your muscle problems do not go away after stopping this medicine, contact your health care professional. This drug is only part of a total heart-health program. Your doctor or a dietician can suggest a low-cholesterol and low-fat diet to help. Avoid alcohol and smoking, and keep a proper exercise schedule. Do not use this drug if you are pregnant or breast-feeding. Serious side effects to an unborn child or to an infant are possible. Talk to your doctor or pharmacist for more information. This medicine may affect blood sugar levels. If you have diabetes, check with your doctor or health care professional before you change your diet or the dose of your diabetic medicine. If you are going to have surgery tell your health care professional that you are taking this drug. What side effects may I notice from receiving this medicine? Side effects that you should report to your doctor or health care professional as soon as possible: -allergic reactions like skin rash, itching or hives, swelling of the face, lips, or tongue -dark urine -fever -joint pain -muscle cramps, pain -redness, blistering, peeling or loosening of the skin,  including inside the mouth -trouble passing urine or change in the amount of urine -unusually weak or tired -yellowing of eyes or skin Side effects that usually do not require medical attention (report to your doctor or health care professional if they continue or are bothersome): -constipation -heartburn -stomach gas, pain, upset This list may not describe all possible side effects. Call your doctor for medical advice about side effects. You may report side effects to FDA at 1-800-FDA-1088. Where should I keep my medicine? Keep out of the reach of  children. Store at room temperature between 20 to 25 degrees C (68 to 77 degrees F). Throw away any unused medicine after the expiration date. NOTE: This sheet is a summary. It may not cover all possible information. If you have questions about this medicine, talk to your doctor, pharmacist, or health care provider.    2016, Elsevier/Gold Standard. (2011-04-06 16:10:96)  Docusate Sodium; Senna tablets or capsules What is this medicine? DOCUSATE SODIUM; SENNA (doc CUE sayt SOE dee um; SEN na) contains a stool softener and a laxative. It is used to treat constipation. This medicine may be used for other purposes; ask your health care provider or pharmacist if you have questions. What should I tell my health care provider before I take this medicine? They need to know if you have any of these conditions: -nausea or vomiting -severe constipation -stomach pain -sudden change in bowel habit lasting more than 2 weeks -an unusual or allergic reaction to docusate, senna, other medicines, foods, dyes, or preservatives -pregnant or trying to get pregnant -breast-feeding How should I use this medicine? Take this medicine by mouth with a full glass of water. Follow the directions on the label. Take your doses at regular intervals. Do not take your medicine more often than directed. Talk to your pediatrician regarding the use of this medicine in children. While this medicine may be prescribed for children as young as 2 years for selected conditions, precautions do apply. Overdosage: If you think you have taken too much of this medicine contact a poison control center or emergency room at once. NOTE: This medicine is only for you. Do not share this medicine with others. What if I miss a dose? If you miss a dose, take it as soon as you can. If it is almost time for your next dose, take only that dose. Do not take double or extra doses. What may interact with this medicine? -mineral oil This list may not  describe all possible interactions. Give your health care provider a list of all the medicines, herbs, non-prescription drugs, or dietary supplements you use. Also tell them if you smoke, drink alcohol, or use illegal drugs. Some items may interact with your medicine. What should I watch for while using this medicine? Do not use for more than one week without advice from your doctor or health care professional. Long-term use can make your body depend on the laxative for regular bowel movements, damage the bowel, cause malnutrition, and problems with the amounts of water and salts in your body. If your constipation keeps returning, check with your doctor or health care professional. Drink plenty of water while taking this medicine. This will help fight constipation. Stop using this medicine and contact your doctor or health care professional if you experience any rectal bleeding or do not have a bowel movement after use. These could be signs of a more serious condition. What side effects may I notice from receiving this medicine? Side effects that  you should report to your doctor or health care professional as soon as possible: -allergic reactions like skin rash, itching or hives, swelling of the face, lips, or tongue -muscle weakness -unusually weak or tired -unusual weight loss Side effects that usually do not require medical attention (report to your doctor or health care professional if they continue or are bothersome): -diarrhea -discolored urine -nausea, vomiting -stomach cramps -throat irritation This list may not describe all possible side effects. Call your doctor for medical advice about side effects. You may report side effects to FDA at 1-800-FDA-1088. Where should I keep my medicine? Keep out of the reach of children. Store at room temperature between 15 and 30 degrees C (59 and 86 degrees F). Throw away any unused medicine after the expiration date. NOTE: This sheet is a summary. It  may not cover all possible information. If you have questions about this medicine, talk to your doctor, pharmacist, or health care provider.    2016, Elsevier/Gold Standard. (2014-09-24 16:29:39)  STROKE/TIA DISCHARGE INSTRUCTIONS SMOKING Cigarette smoking nearly doubles your risk of having a stroke & is the single most alterable risk factor  If you smoke or have smoked in the last 12 months, you are advised to quit smoking for your health.  Most of the excess cardiovascular risk related to smoking disappears within a year of stopping.  Ask you doctor about anti-smoking medications  [place] Quit Line: 1-800-QUIT NOW  Free Smoking Cessation Classes [phone]  CHOLESTEROL Know your levels; limit fat & cholesterol in your diet  Lipid Panel     Component Value Date/Time   CHOL 265* 03/14/2015 0930   TRIG 71 03/14/2015 0930   HDL 81 03/14/2015 0930   CHOLHDL 3.3 03/14/2015 0930   VLDL 14 03/14/2015 0930   LDLCALC 170* 03/14/2015 0930      Many patients benefit from treatment even if their cholesterol is at goal.  Goal: Total Cholesterol (CHOL) less than 160  Goal:  Triglycerides (TRIG) less than 150  Goal:  HDL greater than 40  Goal:  LDL (LDLCALC) less than 100   BLOOD PRESSURE American Stroke Association blood pressure target is less that 120/80 mm/Hg  Your discharge blood pressure is:  BP: (!) 142/94 mmHg (RN Notified)  Monitor your blood pressure  Limit your salt and alcohol intake  Many individuals will require more than one medication for high blood pressure  DIABETES (A1c is a blood sugar average for last 3 months) Goal HGBA1c is under 7% (HBGA1c is blood sugar average for last 3 months)  Diabetes: No known diagnosis of diabetes    Lab Results  Component Value Date   HGBA1C 6.1* 03/14/2015     Your HGBA1c can be lowered with medications, healthy diet, and exercise.  Check your blood sugar as directed by your physician  Call your physician if you experience  unexplained or low blood sugars.  PHYSICAL ACTIVITY/REHABILITATION Goal is 30 minutes at least 4 days per week  Activity: Increase activity slowly, Therapies: Physical Therapy: Outpatient and Occupational Therapy: Outpatient, Speech therapy: Outpatient  Activity decreases your risk of heart attack and stroke and makes your heart stronger.  It helps control your weight and blood pressure; helps you relax and can improve your mood.  Participate in a regular exercise program.  Talk with your doctor about the best form of exercise for you (dancing, walking, swimming, cycling).  DIET/WEIGHT Goal is to maintain a healthy weight  Your discharge diet is: Diet Heart Room service appropriate?:  Yes; Fluid consistency:: Thin Diet - low sodium heart healthy thin liquids Your height is:  Height: 5\' 2"  (157.5 cm) Your current weight is: Weight: 74 kg (163 lb 2.3 oz) Your Body Mass Index (BMI) is:  BMI (Calculated): 29.9  Following the type of diet specifically designed for you will help prevent another stroke.  Your goal Body Mass Index (BMI) is 19-24.  Healthy food habits can help reduce 3 risk factors for stroke:  High cholesterol, hypertension, and excess weight.  RESOURCES Stroke/Support Group:  Call [phone]   STROKE EDUCATION PROVIDED/REVIEWED AND GIVEN TO PATIENT Stroke warning signs and symptoms How to activate emergency medical system (call 911). Medications prescribed at discharge. Need for follow-up after discharge. Personal risk factors for stroke. Pneumonia vaccine given: Yes, Date 03/16/2016 Flu vaccine given: No My questions have been answered, the writing is legible, and I understand these instructions.  I will adhere to these goals & educational materials that have been provided to me after my discharge from the hospital.    Cardiac Event Monitoring A cardiac event monitor is a small recording device used to help detect abnormal heart rhythms (arrhythmias). The monitor is used  to record heart rhythm when noticeable symptoms such as the following occur:  Fast heartbeats (palpitations), such as heart racing or fluttering.  Dizziness.  Fainting or light-headedness.  Unexplained weakness. The monitor is wired to two electrodes placed on your chest. Electrodes are flat, sticky disks that attach to your skin. The monitor can be worn for up to 30 days. You will wear the monitor at all times, except when bathing.  HOW TO USE YOUR CARDIAC EVENT MONITOR A technician will prepare your chest for the electrode placement. The technician will show you how to place the electrodes, how to work the monitor, and how to replace the batteries. Take time to practice using the monitor before you leave the office. Make sure you understand how to send the information from the monitor to your health care provider. This requires a telephone with a landline, not a cell phone. You need to:  Wear your monitor at all times, except when you are in water:  Do not get the monitor wet.  Take the monitor off when bathing. Do not swim or use a hot tub with it on.  Keep your skin clean. Do not put body lotion or moisturizer on your chest.  Change the electrodes daily or any time they stop sticking to your skin. You might need to use tape to keep them on.  It is possible that your skin under the electrodes could become irritated. To keep this from happening, try to put the electrodes in slightly different places on your chest. However, they must remain in the area under your left breast and in the upper right section of your chest.  Make sure the monitor is safely clipped to your clothing or in a location close to your body that your health care provider recommends.  Press the button to record when you feel symptoms of heart trouble, such as dizziness, weakness, light-headedness, palpitations, thumping, shortness of breath, unexplained weakness, or a fluttering or racing heart. The monitor is always  on and records what happened slightly before you pressed the button, so do not worry about being too late to get good information.  Keep a diary of your activities, such as walking, doing chores, and taking medicine. It is especially important to note what you were doing when you pushed the button to record  your symptoms. This will help your health care provider determine what might be contributing to your symptoms. The information stored in your monitor will be reviewed by your health care provider alongside your diary entries.  Send the recorded information as recommended by your health care provider. It is important to understand that it will take some time for your health care provider to process the results.  Change the batteries as recommended by your health care provider. SEEK IMMEDIATE MEDICAL CARE IF:   You have chest pain.  You have extreme difficulty breathing or shortness of breath.  You develop a very fast heartbeat that persists.  You develop dizziness that does not go away.  You faint or constantly feel you are about to faint.   This information is not intended to replace advice given to you by your health care provider. Make sure you discuss any questions you have with your health care provider.   Document Released: 02/24/2008 Document Revised: 06/07/2014 Document Reviewed: 11/13/2012 Elsevier Interactive Patient Education [employer]

## 2015-03-17 NOTE — Progress Notes (Signed)
Occupational Therapy Treatment Patient Details Name: Amber [name] MRN: [id number] DOB: 10-Aug-1970 Today's Date: 03/17/2015    History of present illness 44 y.o. female admitted to [facility] on 03/14/15 with slurred speech and left facial droop.  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  Pt with significant PMHx of asthma, depression, HA (migraines), HTN, and left second metatarsal surgery.    OT comments  Pt progressing towards acute OT goals. Focus of session was toilet transfer and toilet clothing management/hygiene. Pt also completed grooming tasks standing at sink with occasional external support from sink but no LOB. Of note, pt had difficulty opening containers with left hand with apraxic movements noted. OT to continue to follow acutely and recommend OP OT at d/c.   Follow Up Recommendations  Outpatient OT    Equipment Recommendations       Recommendations for Other Services      Precautions / Restrictions Precautions Precautions: Fall Restrictions Weight Bearing Restrictions: No       Mobility Bed Mobility Overal bed mobility: Modified Independent             General bed mobility comments: with bed flat and no rails used. increased time needed  Transfers Overall transfer level: Needs assistance Equipment used: None Transfers: Sit to/from Stand Sit to Stand: Supervision         General transfer comment: decreased speed; from EOB and comfort height toilet    Balance Overall balance assessment: Needs assistance Sitting-balance support: No upper extremity supported;Feet supported Sitting balance-Leahy Scale: Good     Standing balance support: No upper extremity supported;During functional activity Standing balance-Leahy Scale: Good Standing balance comment: stood to complete grooming at sink, occasional external support of sink. stood in static standing position  for about 5 minutes with no LOB or swaying                   ADL Overall ADL's : Needs assistance/impaired     Grooming: Oral care;Wash/dry hands;Min guard;Standing Grooming Details (indicate cue type and reason): difficulty noted opening containers with Lt hand             Lower Body Dressing: Min guard;Sit to/from stand   Toilet Transfer: Min guard;Ambulation;Comfort height toilet;Grab bars   Toileting- Clothing Manipulation and Hygiene: Min guard;Sit to/from stand       Functional mobility during ADLs: Min guard General ADL Comments: Pt min guard for LB OOB ADLs and functional mobility this session. Pt ambulated with decreased speed and cautiously. Difficulty noted opening containers with left hand with apraxic movements noted. Pt noted to use compensatory strategies for speech this session such as slowing speed of speech in order to increase articulation.      Vision                     Perception     Praxis      Cognition   Behavior During Therapy: [facility] for tasks assessed/performed Overall Cognitive Status: Within Functional Limits for tasks assessed                       Extremity/Trunk Assessment               Exercises     Shoulder Instructions       General Comments      Pertinent Vitals/ Pain       Pain Assessment: No/denies pain  Home Living                                          Prior Functioning/Environment              Frequency Min 2X/week     Progress Toward Goals  OT Goals(current goals can now be found in the care plan section)  Progress towards OT goals: Progressing toward goals  Acute Rehab OT Goals Patient Stated Goal: to get back to normal and figure out what she has to do to not have a stroke again ADL Goals Pt Will Perform Lower Body Dressing: sit to/from stand;with modified independence Pt Will Transfer to Toilet: with modified independence;regular height toilet Pt Will  Perform Toileting - Clothing Manipulation and hygiene: with modified independence;sit to/from stand Additional ADL Goal #1: Pt will speak and voice needs during ADL activity I ly  Plan Discharge plan remains appropriate    Co-evaluation                 End of Session Equipment Utilized During Treatment: Gait belt   Activity Tolerance Patient tolerated treatment well   Patient Left in bed;with call bell/phone within reach;with bed alarm set;with family/visitor present   Nurse Communication          Time: 1313-1340 OT Time Calculation (min): 27 min  Charges: OT General Charges $OT Visit: 1 Procedure OT Treatments $Self Care/Home Management : 23-37 mins  Pilar Grammes 03/17/2015, 1:57 PM

## 2015-03-17 NOTE — Progress Notes (Signed)
STROKE TEAM PROGRESS NOTE   SUBJECTIVE (INTERVAL HISTORY) Family at bedside. Pt working with OT. She feels her speech is improving.    OBJECTIVE Temp:  [97.6 F (36.4 C)-98.7 F (37.1 C)] 97.7 F (36.5 C) (10/17 1009) Pulse Rate:  [68-100] 69 (10/17 1009) Cardiac Rhythm:  [-] Normal sinus rhythm (10/17 0700) Resp:  [12-20] 16 (10/17 1009) BP: (116-160)/(74-111) 130/95 mmHg (10/17 1009) SpO2:  [95 %-100 %] 100 % (10/17 1009)   Recent Labs Lab 03/15/15 0640 03/15/15 1116 03/15/15 1618 03/15/15 2155 03/16/15 0626  GLUCAP 98 92 96 119* 110*    Recent Labs Lab 03/14/15 0106 03/14/15 0113 03/14/15 0930 03/15/15 0748 03/16/15 0408  NA 140 141  --  138 137  K 3.6 3.5  --  5.7* 4.3  CL 104 105  --  107 101  CO2 24  --   --  19* 28  GLUCOSE 117* 117*  --  99 94  BUN 16 18  --  13 8  CREATININE 1.06* 1.10* 0.98 0.78 0.86  CALCIUM 9.6  --   --  9.4 9.1    Recent Labs Lab 03/14/15 0106 03/16/15 0408  AST 17 24  ALT 18 17  ALKPHOS 59 56  BILITOT 0.5 1.0  PROT 7.2 6.4*  ALBUMIN 4.0 3.3*    Recent Labs Lab 03/14/15 0106 03/14/15 0113 03/14/15 0930  WBC 5.8  --  5.4  NEUTROABS 1.9  --   --   HGB 14.5 16.0* 14.0  HCT 44.2 47.0* 42.9  MCV 87.0  --  87.0  PLT 207  --  215       Component Value Date/Time   CHOL 265* 03/14/2015 0930   TRIG 71 03/14/2015 0930   HDL 81 03/14/2015 0930   CHOLHDL 3.3 03/14/2015 0930   VLDL 14 03/14/2015 0930   LDLCALC 170* 03/14/2015 0930   Lab Results  Component Value Date   HGBA1C 6.1* 03/14/2015      Component Value Date/Time   LABOPIA NONE DETECTED 03/14/2015 0138   COCAINSCRNUR NONE DETECTED 03/14/2015 0138   LABBENZ NONE DETECTED 03/14/2015 0138   AMPHETMU NONE DETECTED 03/14/2015 0138   THCU NONE DETECTED 03/14/2015 0138   LABBARB NONE DETECTED 03/14/2015 0138    Ct Angio Head W/cm &/or Wo Cm  03/14/2015  IMPRESSION: Negative CTA of the head and neck.    Ct Head Wo Contrast  03/14/2015  IMPRESSION:  Negative noncontrast CT appearance of the brain.    Mr Brain Wo Contrast  03/14/2015  IMPRESSION: 1. Acute ischemic nonhemorrhagic linear infarct involving the posterior left frontal lobe cortical gray matter and subcortical white matter. No associated mass effect. 2. Small remote right parietal cortical infarct with additional probable tiny remote left cerebellar infarcts. 3. Mild chronic small vessel ischemic disease.   2D Echocardiogram  - Left ventricle: The cavity size was normal. Systolic function wasnormal. The estimated ejection fraction was 50%. Wall motion was normal; there were no regional wall motion abnormalities. Dopplerparameters are consistent with abnormal left ventricularrelaxation (grade 1 diastolic dysfunction). Doppler parametersare consistent with elevated ventricular end-diastolic fillingpressure. - Aortic valve: Structurally normal valve. There was no regurgitation. - Mitral valve: Structurally normal valve. There was mild regurgitation. - Left atrium: The atrium was normal in size. - Right ventricle: The cavity size was normal. Wall thickness wasnormal. Systolic function was normal. - Right atrium: The atrium was normal in size. - Tricuspid valve: There was mild regurgitation. - Pulmonic valve: There was trivial regurgitation. -  Inferior vena cava: The vessel was normal in size. The respirophasic diameter changes were in the normal range (>= 50%),consistent with normal central venous pressure. - Pericardium, extracardiac: The pericardium was normal in appearance. Impressions: - LVEF is mildly impaired with diffuse hypokinesis. Abnormal relaxation with mildly elevated filling pressures. Mild MR and TR.  TCD bubble study - negative for PFO  Hypercoagulable and autoimmune work up - pending  TEE Negative saline microcavitation study. No PFO or ASD. No LA/LAA thrombus or mass.   PHYSICAL EXAM General - Well nourished, well developed, mild  lethargy.  Ophthalmologic - Sharp disc margins OU.  Cardiovascular - Regular rate and rhythm with no murmur.  Neck - supple, no carotid bruits  Mental Status -  Level of arousal and orientation to time, place, and person were intact. Language exam showed mild expressive aphasia, and mild deficit with repetition, but intact with naming and comprehension. Fund of Knowledge was assessed and was intact.  Cranial Nerves II - XII - II - Visual field intact OU. III, IV, VI - Extraocular movements intact. V - Facial sensation intact bilaterally. VII - mild right nasolabial fold flattening. VIII - Hearing & vestibular intact bilaterally. X - Palate elevates symmetrically. XI - Chin turning & shoulder shrug intact bilaterally. XII - Tongue protrusion intact.  Motor Strength - The patient's strength was normal in all extremities and pronator drift was absent.  Bulk was normal and fasciculations were absent.   Motor Tone - Muscle tone was assessed at the neck and appendages and was normal.  Reflexes - The patient's reflexes were symmetrical in all extremities and she had no pathological reflexes.  Sensory - Light touch, temperature/pinprick were assessed and were symmetrical.    Coordination - The patient had normal movements in the hands with no ataxia or dysmetria.  Tremor was absent.  Gait and Station - The patient's transfers, posture, gait, station, and turns were observed as normal.   ASSESSMENT/PLAN Amber [name] is a 44 y.o. female with history of HTN, HLD admitted for word finding difficulties. Symptoms improving. Pt denies OCP use or smoking hx.   Stroke:  Dominant left MCA small cortical infarct, embolic pattern secondary to unknown source  MRI  Left MCA cortical small linear infarct  CTA head and neck negative for dissection  2D Echo EF 50%, no SOE  TCD bubble study negative for PFO  TEE with no PFO, no SOE  For loop recorder placement today  Hypercoagulable  and autoimmune work up pending - will follow up as an OP  LDL 170  HgbA1c 6.1  lovenox for VTE prophylaxis  Diet Heart Room service appropriate?: Yes; Fluid consistency:: Thin   no antithrombotic prior to admission, now on aspirin 325 mg orally every day.   Patient counseled to be compliant with her antithrombotic medications  Ongoing aggressive stroke risk factor management  Therapy recommendations :  OP PT, OT and ST  Ok for discharge once loop placed from stroke standpoint.  Follow up Dr. Pearlean Brownie in 2 months  Hypertension  Home meds:   none  Currently on none  Stable  Hyperlipidemia  Home meds:  none   Currently on lipitor 40  LDL 170, goal < 70  Continue statin at discharge   Other Active Problems  No OCP use  No hx of smoking  Hyperkalemia - 5.7 Saturday - the patient received Kayexalate - Sunday potassium 4.3  Other Pertinent History    Hospital day # 3  Rhoderick Moody  [facility] See Amion for Pager information 03/17/2015 1:34 PM  I have personally examined this patient, reviewed notes, independently viewed imaging studies, participated in medical decision making and plan of care. I have made any additions or clarifications directly to the above note. Agree with note above. Follow-up stroke clinic in 2 months or call earlier if necessary Delia Heady, MD Medical Director [facility] Pager: [phone] 03/17/2015 5:14 PM  To contact Stroke Continuity provider, please refer to [email or web address]. After hours, contact General Neurology

## 2015-03-17 NOTE — H&P (Signed)
Patient ID: Amber [name] MRN: [id number] DOB/AGE: 1970/12/14 44 y.o.  Admit date: 03/14/2015 Primary Physician Morrie Sheldon, NP Primary Cardiologist unassigned   Chief Complaint  stroke  HPI: Amber [name] is a 80F with hypertension and hyperlipidemia here with L posterior lobe cortical and subcortical stroke.  Echo showed LVEF 50% and grade 1 diastolic dysfunction.  Bubble study was negative.  CT-A of the neck was negative.  She has a mild expressive aphasia      Review of Systems: as per HPI  Past Medical History  Diagnosis Date  . Asthma   . Depression   . Headache   . Hyperlipidemia   . Hypertension   . UTI (lower urinary tract infection)   . IBS (irritable bowel syndrome)   . Migraine     Medications Prior to Admission  Medication Sig Dispense Refill  . aspirin-acetaminophen-caffeine (EXCEDRIN MIGRAINE) 250-250-65 MG tablet Take 2 tablets by mouth every 6 (six) hours as needed for headache or migraine.    Marland Kitchen ibuprofen (ADVIL,MOTRIN) 200 MG tablet Take 200-400 mg by mouth every 6 (six) hours as needed for moderate pain.    . Prenatal Vit-Fe Fumarate-FA (PRENATAL MULTIVITAMIN) TABS tablet Take 1 tablet by mouth daily at 12 noon.       Marland Kitchen aspirin  300 mg Rectal Daily   Or  . aspirin  325 mg Oral Daily  . atorvastatin  40 mg Oral q1800  . enoxaparin (LOVENOX) injection  40 mg Subcutaneous Daily  . senna-docusate  1 tablet Oral BID    Infusions:    Allergies  Allergen Reactions  . Cephalexin Rash    Social History   Social History  . Marital Status: Married    Spouse Name: N/A  . Number of Children: N/A  . Years of Education: N/A   Occupational History  . Not on file.   Social History Main Topics  . Smoking status: Never Smoker   . Smokeless tobacco: Not on file  . Alcohol Use: 0.0 oz/week    0 Standard drinks or equivalent per week     Comment: rarely  . Drug Use: No  . Sexual Activity: Not on file   Other Topics Concern  .  Not on file   Social History Narrative   Originally from [place]   Family lives up here in [place]   Has one daughter.   Enjoys spending time shopping and spending time with family        Family History  Problem Relation Age of Onset  . Arthritis Father   . Diabetes Father   . Hypertension Father   . Hyperlipidemia Father   . Hyperlipidemia Mother   . Heart attack Maternal Grandfather   . Stroke Paternal Grandfather     PHYSICAL EXAM: Filed Vitals:   03/17/15 0806  BP: 160/111  Pulse: 70  Temp:   Resp: 15    No intake or output data in the 24 hours ending 03/17/15 0818  General:  Well appearing. No respiratory difficulty HEENT: normal Neck: supple. no JVD. Carotids 2+ bilat; no bruits. No lymphadenopathy or thryomegaly appreciated. Cor: PMI nondisplaced. Regular rate & rhythm. No rubs, gallops or murmurs. Lungs: clear Abdomen: soft, nontender, nondistended. No hepatosplenomegaly. No bruits or masses. Good bowel sounds. Extremities: no cyanosis, clubbing, rash, edema Neuro: alert & oriented x 3, R facial droop. moves all 4 extremities w/o difficulty. Affect pleasant.  Mild expressive aphasia  No results found for this or any previous visit (  from the past 24 hour(s)). No results found.   ASSESSMENT/PLAN:  72F s/p L frontal stroke.  Will proceed with TEE.  .  SignedMadilyn Hook, MD 03/17/2015, 8:18 AM

## 2015-03-17 NOTE — Progress Notes (Signed)
Patient left medtronic remote. Family called to come pick it up. Remote is at the front with Diplomatic Services operational officer, Diplomatic Services operational officer aware.

## 2015-03-17 NOTE — Progress Notes (Signed)
  Echocardiogram Echocardiogram Transesophageal has been performed.  Amber [name] 03/17/2015, 10:51 AM

## 2015-03-17 NOTE — Progress Notes (Signed)
Physical Therapy Treatment Patient Details Name: Amber [name] MRN: [id number] DOB: 1971-01-28 Today's Date: 03/17/2015    History of Present Illness 44 y.o. female admitted to [facility] on 03/14/15 with slurred speech and left facial droop.  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  Pt with significant PMHx of asthma, depression, HA (migraines), HTN, and left second metatarsal surgery.     PT Comments    Pt is progressing towards goals. Pt was able to descend stairs today without UE support. Pt presented with step to pattern during stair descending. Pt's gait speed noticeably slower when processing verbal commands during gait training. Pt had difficulty distinguishing R from L during today's tx session. Pt will benefit from continued skilled acute care physical therapy to address balance deficits and improve mobility for d/c to outpatient PT.  Follow Up Recommendations  Outpatient PT;Supervision - Intermittent     Equipment Recommendations  None recommended by PT    Recommendations for Other Services       Precautions / Restrictions Precautions Precautions: Fall Restrictions Weight Bearing Restrictions: No    Mobility  Bed Mobility Overal bed mobility: Modified Independent             General bed mobility comments: with bed flat and no rails used. increased time needed  Transfers Overall transfer level: Needs assistance Equipment used: None Transfers: Sit to/from Stand Sit to Stand: Supervision           Ambulation/Gait Ambulation/Gait assistance: Supervision Ambulation Distance (Feet): 200 Feet Assistive device: None Gait Pattern/deviations: Step-through pattern Gait velocity: decreased   General Gait Details: Pt presented with slow gait and was able to speed up with vcs. Pt presented with out toeing to inc BOS during gait.   Stairs   Stairs assistance:  Supervision/Contact Guard Stair Management: No rails;Alternating pattern;Step to pattern (Alternating ascending, step to descending pattern) Number of Stairs: 11    Wheelchair Mobility    Modified Rankin (Stroke Patients Only)       Balance Overall balance assessment: Needs assistance Sitting-balance support: No upper extremity supported;Feet supported Sitting balance-Leahy Scale: Good     Standing balance support: No upper extremity supported;During functional activity Standing balance-Leahy Scale: Good Standing balance comment: stood to complete grooming at sink, occasional external support of sink. stood in static standing position for about 5 minutes with no LOB or swaying Single Leg Stance - Right Leg: 10 Single Leg Stance - Left Leg: 10 Tandem Stance - Right Leg: 20 Tandem Stance - Left Leg: 20   [place]berg - Eyes Closed: 30 High level balance activites: Turns;Head turns;Other (comment) (speed changes, stepping over and around obstacles) High Level Balance Comments: Pt had to stop before stepping over obstacle with R leg. Pt's gait slowed when processing verbal commands during high level balance activities.    Cognition Arousal/Alertness: Awake/alert Behavior During Therapy: WFL for tasks assessed/performed Overall Cognitive Status: Within Functional Limits for tasks assessed       Memory:  (Pt required prompting when asked to recall HEP)              Exercises      General Comments        Pertinent Vitals/Pain Pain Assessment: No/denies pain    Home Living                      Prior Function  PT Goals (current goals can now be found in the care plan section) Acute Rehab PT Goals Patient Stated Goal: to get back to normal and figure out what she has to do to not have a stroke again Progress towards PT goals: Progressing toward goals    Frequency  Min 4X/week    PT Plan Current plan remains appropriate    Co-evaluation              End of Session Equipment Utilized During Treatment: Gait belt Activity Tolerance: Patient tolerated treatment well Patient left: in bed;with family/visitor present;Other (comment) (with Case Manager )     Time: 4332-9518 PT Time Calculation (min) (ACUTE ONLY): 20 min  Charges:  $Gait Training: 8-22 mins                    G Codes:      Terilyn Sano,CYNDI April 12, 2015, 3:52 PM Sheran Lawless, PT [phone] 04/12/2015

## 2015-03-17 NOTE — Progress Notes (Signed)
RN discussed discharge instructions with patient and patient's mother. Patient vocalized understanding of follow up visit with neurology, primary care physician, and outpatient therapies, vocalized understanding of new medications, care for loop recorder dressing. Tele removed, IV removed. NIHHS unchanged, remains 2 for speech difficulties. Neuro assessment unchanged. Patient denies any pain. Loop recorder dressing was saturated in blood, dressing changed, will recheck for drainage prior to discharge. Consent for [facility] consult signed, faxed. Will continue to monitor patient until discharge. Patient to be escorted via wheelchair to car with patient's mother.

## 2015-03-17 NOTE — Progress Notes (Signed)
Patient discharged home with mother. Patient denied any pain, neuro assessment unchanged, new dressing over loop recorder has no new drainage. Family vocalized understanding of care for dressing. Patient escorted to car by RN and tech.

## 2015-03-17 NOTE — Discharge Summary (Signed)
Physician Discharge Summary  Amber [name] MRN: [id number] DOB/AGE: 12/05/1970 44 y.o.  PCP: Clark,Katherine Kendal, NP   Admit date: 03/14/2015 Discharge date: 03/17/2015  Discharge Diagnoses:     Principal Problem:   Acute ischemic stroke (HCC) Active Problems:   Hyperlipidemia   Essential hypertension   Stroke (HCC)   Dysarthria    Follow-up recommendations Follow-up with PCP in 3-5 days , including all  additional recommended appointments as below Follow-up CBC, CMP in 3-5 days  Follow up Dr. Sethi in 2 months  Patient needs to be set up for outpatient PT/OT/SLP  Hypercoagulable and autoimmune work up pending    Medication List    STOP taking these medications        ibuprofen 200 MG tablet  Commonly known as:  ADVIL,MOTRIN      TAKE these medications        aspirin 325 MG tablet  Take 1 tablet (325 mg total) by mouth daily.     aspirin-acetaminophen-caffeine 250-250-65 MG tablet  Commonly known as:  EXCEDRIN MIGRAINE  Take 2 tablets by mouth every 6 (six) hours as needed for headache or migraine.     atorvastatin 40 MG tablet  Commonly known as:  LIPITOR  Take 1 tablet (40 mg total) by mouth daily at 6 PM.     prenatal multivitamin Tabs tablet  Take 1 tablet by mouth daily at 12 noon.     senna-docusate 8.6-50 MG tablet  Commonly known as:  Senokot-S  Take 1 tablet by mouth 2 (two) times daily.         Discharge Condition: Stable    Disposition: Final discharge disposition not confirmed   Consults:  Neurology Cardiology  Significant Diagnostic Studies:  Ct Angio Head W/cm &/or Wo Cm  03/14/2015  CLINICAL DATA:  Acute cerebral infarct. EXAM: CT ANGIOGRAPHY HEAD AND NECK TECHNIQUE: Multidetector CT imaging of the head and neck was performed using the standard protocol during bolus administration of intravenous contrast. Multiplanar CT image reconstructions and MIPs were obtained to evaluate the vascular anatomy. Carotid stenosis  measurements (when applicable) are obtained utilizing NASCET criteria, using the distal internal carotid diameter as the denominator. CONTRAST:  80mL OMNIPAQUE IOHEXOL 350 MG/ML SOLN COMPARISON:  None. FINDINGS: CTA NECK Aortic arch: No aneurysm, dissection, or wall thickening. Two vessel branching. Right carotid system: Widely patent. No atheromatous changes or dissection. Left carotid system: Widely patent. No atheromatous changes or dissection. No evidence of vasculopathy in the carotid systems. Vertebral arteries:Symmetric vertebral arteries. Limited evaluation of the left V1 and proximal V2 segments due to intravenous contrast streak artifact. No evidence of stenosis or vasculopathy. Skeleton: Negative Other neck: No incidentally detected mass or concerning nodes in the neck. Clear apical lungs. CTA HEAD Limited by venous contamination. Anterior circulation: Symmetric carotid arteries. No significant communicating arteries. No major vessel occlusion or flow limiting stenosis. No indication of atherosclerosis or vasculopathy. No aneurysm. Posterior circulation: Symmetric vertebral arteries. Symmetric vertebral and basilar branching. No major vessel occlusion, flow limiting stenosis, or evidence of atherosclerosis or vasculopathy. No aneurysm. Venous sinuses: Patent Anatomic variants: Incomplete circle-of-Willis with no significant communicating arteries. Delayed phase: No parenchymal enhancement or mass lesion detected. IMPRESSION: Negative CTA of the head and neck. Electronically Signed   By: Jonathon  Watts M.D.   On: 03/14/2015 08:01   Ct Head Wo Contrast  03/14/2015  ADDENDUM REPORT: 03/14/2015 01:21 ADDENDUM: Study discussed by telephone with Dr. Leslie Reynolds on 03/14/2015 at 0120 hours. Electronically Signed   By: H    Hall M.D.   On: 03/14/2015 01:21  03/14/2015  CLINICAL DATA:  44-year-old female with slurred speech and right arm weakness. Code stroke. Initial encounter. EXAM: CT HEAD WITHOUT  CONTRAST TECHNIQUE: Contiguous axial images were obtained from the base of the skull through the vertex without intravenous contrast. COMPARISON:  None. FINDINGS: Visualized paranasal sinuses and mastoids are clear. No osseous abnormality identified. Visualized orbit soft tissues are within normal limits. Visualized scalp soft tissues are within normal limits. Mildly decreased for age cerebral volume. No midline shift, mass effect, or evidence of intracranial mass lesion. No ventriculomegaly. No acute intracranial hemorrhage identified. No suspicious intracranial vascular hyperdensity. No cortically based acute infarct identified. IMPRESSION: Negative noncontrast CT appearance of the brain. Electronically Signed: By: H  Hall M.D. On: 03/14/2015 01:17   Ct Angio Neck W/cm &/or Wo/cm  03/14/2015  CLINICAL DATA:  Acute cerebral infarct. EXAM: CT ANGIOGRAPHY HEAD AND NECK TECHNIQUE: Multidetector CT imaging of the head and neck was performed using the standard protocol during bolus administration of intravenous contrast. Multiplanar CT image reconstructions and MIPs were obtained to evaluate the vascular anatomy. Carotid stenosis measurements (when applicable) are obtained utilizing NASCET criteria, using the distal internal carotid diameter as the denominator. CONTRAST:  80mL OMNIPAQUE IOHEXOL 350 MG/ML SOLN COMPARISON:  None. FINDINGS: CTA NECK Aortic arch: No aneurysm, dissection, or wall thickening. Two vessel branching. Right carotid system: Widely patent. No atheromatous changes or dissection. Left carotid system: Widely patent. No atheromatous changes or dissection. No evidence of vasculopathy in the carotid systems. Vertebral arteries:Symmetric vertebral arteries. Limited evaluation of the left V1 and proximal V2 segments due to intravenous contrast streak artifact. No evidence of stenosis or vasculopathy. Skeleton: Negative Other neck: No incidentally detected mass or concerning nodes in the neck. Clear  apical lungs. CTA HEAD Limited by venous contamination. Anterior circulation: Symmetric carotid arteries. No significant communicating arteries. No major vessel occlusion or flow limiting stenosis. No indication of atherosclerosis or vasculopathy. No aneurysm. Posterior circulation: Symmetric vertebral arteries. Symmetric vertebral and basilar branching. No major vessel occlusion, flow limiting stenosis, or evidence of atherosclerosis or vasculopathy. No aneurysm. Venous sinuses: Patent Anatomic variants: Incomplete circle-of-Willis with no significant communicating arteries. Delayed phase: No parenchymal enhancement or mass lesion detected. IMPRESSION: Negative CTA of the head and neck. Electronically Signed   By: Jonathon  Watts M.D.   On: 03/14/2015 08:01   Mr Brain Wo Contrast  03/14/2015  CLINICAL DATA:  Initial valuation for acute episode of dysarthria EXAM: MRI HEAD WITHOUT CONTRAST TECHNIQUE: Multiplanar, multiecho pulse sequences of the brain and surrounding structures were obtained without intravenous contrast. COMPARISON:  Prior noncontrast head CT from earlier the same day. FINDINGS: Cerebral volume within normal limits for patient age. Minimal patchy T2/FLAIR hyperintensity within the periventricular white matter noted, likely related to very mild chronic small vessel ischemic disease. Small focus of FLAIR hyperintensity within the cortex of the high right parietal lobe likely reflects a small remote cortical infarct (series 10, image 16). Probable few scattered small remote left cerebellar infarcts noted as well. There is linear focus of restricted diffusion involving the posterior cortex and underlying subcortical white matter in the posterior left frontal region (series 3, image 30). Corresponding signal loss seen on ADC map (series 300, image 31). No other areas of acute infarction identified. Gray-white matter differentiation otherwise maintained. Normal intravascular flow voids are preserved.  No acute or chronic intracranial hemorrhage. No mass lesion, midline shift, or mass effect. No hydrocephalus. No extra-axial fluid collection. Craniocervical junction   within normal limits. Pituitary gland normal. No acute abnormality about the orbits. Mild mucosal thickening within the ethmoidal air cells. Paranasal sinuses are otherwise clear. No mastoid effusion. Inner ear structures normal. Bone marrow signal intensity within normal limits. No scalp soft tissue abnormality. IMPRESSION: 1. Acute ischemic nonhemorrhagic linear infarct involving the posterior left frontal lobe cortical gray matter and subcortical white matter. No associated mass effect. 2. Small remote right parietal cortical infarct with additional probable tiny remote left cerebellar infarcts. 3. Mild chronic small vessel ischemic disease. Electronically Signed   By: Benjamin  McClintock M.D.   On: 03/14/2015 03:33    TEE  LVEF 45-50%. Diffuse, global hypokinesis.  Trivial MR. No TR, AR, PR.  Negative saline microcavitation study. No PFO or ASD. No LA/LAA thrombus or mass.  2-D echo LV EF: 50%  ------------------------------------------------------------------- Indications:   CVA 436.  ------------------------------------------------------------------- History:  Risk factors: Hypertension.  ------------------------------------------------------------------- Study Conclusions  - Left ventricle: The cavity size was normal. Systolic function was normal. The estimated ejection fraction was 50%. Wall motion was normal; there were no regional wall motion abnormalities. Doppler parameters are consistent with abnormal left ventricular relaxation (grade 1 diastolic dysfunction). Doppler parameters are consistent with elevated ventricular end-diastolic filling pressure. - Aortic valve: Structurally normal valve. There was no regurgitation. - Mitral valve: Structurally normal valve. There was  mild regurgitation. - Left atrium: The atrium was normal in size. - Right ventricle: The cavity size was normal. Wall thickness was normal. Systolic function was normal. - Right atrium: The atrium was normal in size. - Tricuspid valve: There was mild regurgitation. - Pulmonic valve: There was trivial regurgitation. - Inferior vena cava: The vessel was normal in size. The respirophasic diameter changes were in the normal range (>= 50%), consistent with normal central venous pressure. - Pericardium, extracardiac: The pericardium was normal in appearance.  Impressions:  - LVEF is mildly impaired with diffuse hypokinesis. Abnormal relaxation with mildly elevated filling pressures. Mild MR and TR.   Filed Weights   03/14/15 0600  Weight: 74 kg (163 lb 2.3 oz)     Microbiology: No results found for this or any previous visit (from the past 240 hour(s)).     Blood Culture No results found for: SDES, SPECREQUEST, CULT, REPTSTATUS    Labs: Results for orders placed or performed during the hospital encounter of 03/14/15 (from the past 48 hour(s))  Glucose, capillary     Status: None   Collection Time: 03/15/15  4:18 PM  Result Value Ref Range   Glucose-Capillary 96 65 - 99 mg/dL   Comment 1 Notify RN    Comment 2 Document in Chart   Glucose, capillary     Status: Abnormal   Collection Time: 03/15/15  9:55 PM  Result Value Ref Range   Glucose-Capillary 119 (H) 65 - 99 mg/dL   Comment 1 Notify RN    Comment 2 Document in Chart   Comprehensive metabolic panel     Status: Abnormal   Collection Time: 03/16/15  4:08 AM  Result Value Ref Range   Sodium 137 135 - 145 mmol/L   Potassium 4.3 3.5 - 5.1 mmol/L    Comment: DELTA CHECK NOTED SPECIMEN HEMOLYZED. HEMOLYSIS MAY AFFECT INTEGRITY OF RESULTS.    Chloride 101 101 - 111 mmol/L   CO2 28 22 - 32 mmol/L   Glucose, Bld 94 65 - 99 mg/dL   BUN 8 6 - 20 mg/dL   Creatinine, Ser 0.86 0.44 - 1.00 mg/dL   Calcium    9.1 8.9 - 10.3 mg/dL   Total Protein 6.4 (L) 6.5 - 8.1 g/dL   Albumin 3.3 (L) 3.5 - 5.0 g/dL   AST 24 15 - 41 U/L   ALT 17 14 - 54 U/L   Alkaline Phosphatase 56 38 - 126 U/L   Total Bilirubin 1.0 0.3 - 1.2 mg/dL   GFR calc non Af Amer >60 >60 mL/min   GFR calc Af Amer >60 >60 mL/min    Comment: (NOTE) The eGFR has been calculated using the CKD EPI equation. This calculation has not been validated in all clinical situations. eGFR's persistently <60 mL/min signify possible Chronic Kidney Disease.    Anion gap 8 5 - 15  Glucose, capillary     Status: Abnormal   Collection Time: 03/16/15  6:26 AM  Result Value Ref Range   Glucose-Capillary 110 (H) 65 - 99 mg/dL   Comment 1 Notify RN    Comment 2 Document in Chart      Lipid Panel     Component Value Date/Time   CHOL 265* 03/14/2015 0930   TRIG 71 03/14/2015 0930   HDL 81 03/14/2015 0930   CHOLHDL 3.3 03/14/2015 0930   VLDL 14 03/14/2015 0930   LDLCALC 170* 03/14/2015 0930     Lab Results  Component Value Date   HGBA1C 6.1* 03/14/2015     Lab Results  Component Value Date   LDLCALC 170* 03/14/2015   CREATININE 0.86 03/16/2015     Brief summary 44 y.o. female without significant PMH who reports that this evening while doing work at home she developed pain on the left side of her face. It was so severe that she was unable to continue her work. She went to The mirror and noted that her face did not look right. When she went to talk to her cousin her speech would not come out normally. EMS was called at that time and the patient was brought in as a code stroke. Initial NIHSS of 3. BP elevated.  Assessment and plan Dominant left MCA small cortica infarct, embolic pattern secondary to unknown source  MRI Left MCA cortical small linear infarct  CTA head and neck negative for dissection  2D Echo EF 50%, no SOE  TCD bubble study negative for PFO  Neurology Recommend TEE which was negative. Hypercoagulable and  autoimmune work up pending, PCP to follow results of these tests.  LDL 170, hemoglobin A1c 6.1, continue aspirin 325 mg by mouth daily for now, continue statin  Patient will need outpatient speech therapy evaluation, PT recommends outpatient physical therapy  Transcranial Doppler without any PFO at rest  [facility] for discharge once loop placed from stroke standpoint.  Follow up Dr. [name] in 2 months  Stopped OCP use,  Headache-likely secondary to stroke, insomnia, patient started on Restoril and Percocet  Dyslipidemia continue statin  Hyperkalemia treated with Kayexalate, potassium is normal today, recheck BMP in 3-5 days,  Constipation-constipation protocol     Discharge Exam:    Blood pressure 130/95, pulse 69, temperature 97.7 F (36.5 C), temperature source Oral, resp. rate 16, height 5' 2" (1.575 m), weight 74 kg (163 lb 2.3 oz), SpO2 100 %.   General - Well nourished, well developed, mild lethargy.  Ophthalmologic - Sharp disc margins OU.  Cardiovascular - Regular rate and rhythm with no murmur.  Neck - supple, no carotid bruits  Mental Status -  Level of arousal and orientation to time, place, and person were intact. Language exam showed mild  expressive aphasia, and mild deficit with repetition, but intact with naming and comprehension. Fund of Knowledge was assessed and was intact.       Discharge Instructions    Ambulatory referral to Neurology    Complete by:  As directed   Please schedule post stroke follow up in 2 months.     Diet - low sodium heart healthy    Complete by:  As directed      Increase activity slowly    Complete by:  As directed            Follow-up Information    Follow up with SETHI,PRAMOD, MD In 2 months.   Specialties:  Neurology, Radiology   Why:  Stroke Clinic, Office will call you with appointment date & time   Contact information:   [address] [place] [address] [phone]        Signed: ABROL,NAYANA 03/17/2015, 2:06 PM        Time spent >45 mins   

## 2015-03-17 NOTE — CV Procedure (Signed)
TEE Brief Procedure Note  LVEF 45-50%.  Diffuse, global hypokinesis.  Trivial MR.  No TR, AR, PR.  Negative saline microcavitation study.  No PFO or ASD. No LA/LAA thrombus or mass.  For full report for further details.  Zahari Fazzino C. Duke Salvia, MD 03/17/2015 8:50 AM

## 2015-03-17 NOTE — Consult Note (Signed)
 Cardiologist: None Reason for Consult: Loop recorder implant Referring Physician: Xu  Amber [name] is an 44 y.o. female.  HPI:   Patient is a 44-year-old female with history of hyperlipidemia, hypertension, IBS, depression and asthma. She presented with a dominant left MCA small cortical infarct with an embolic pattern secondary to unknown source.  We're asked to see for loop recorder implant.  The patient currently denies nausea, vomiting, fever, chest pain, shortness of breath, orthopnea, dizziness, PND, cough, congestion, abdominal pain, lower extremity edema.   Past Medical History  Diagnosis Date  . Asthma   . Depression   . Headache   . Hyperlipidemia   . Hypertension   . UTI (lower urinary tract infection)   . IBS (irritable bowel syndrome)   . Migraine     Past Surgical History  Procedure Laterality Date  . Abdominal hysterectomy  10/2006  . Toe surgery      Left 2nd metatarsal  . Cesarean section  2005    Family History  Problem Relation Age of Onset  . Arthritis Father   . Diabetes Father   . Hypertension Father   . Hyperlipidemia Father   . Hyperlipidemia Mother   . Heart attack Maternal Grandfather   . Stroke Paternal Grandfather     Social History:  reports that she has never smoked. She does not have any smokeless tobacco history on file. She reports that she drinks alcohol. She reports that she does not use illicit drugs.  Allergies:  Allergies  Allergen Reactions  . Cephalexin Rash    Medications:  Scheduled Meds: . aspirin  300 mg Rectal Daily   Or  . aspirin  325 mg Oral Daily  . atorvastatin  40 mg Oral q1800  . enoxaparin (LOVENOX) injection  40 mg Subcutaneous Daily  . [START ON 03/18/2015] pneumococcal 23 valent vaccine  0.5 mL Intramuscular Tomorrow-1000  . senna-docusate  1 tablet Oral BID   Continuous Infusions:  PRN Meds:.acetaminophen **OR** acetaminophen, oxyCODONE, temazepam   Results for orders placed or performed  during the hospital encounter of 03/14/15 (from the past 48 hour(s))  Glucose, capillary     Status: None   Collection Time: 03/15/15 11:16 AM  Result Value Ref Range   Glucose-Capillary 92 65 - 99 mg/dL   Comment 1 Notify RN    Comment 2 Document in Chart   Glucose, capillary     Status: None   Collection Time: 03/15/15  4:18 PM  Result Value Ref Range   Glucose-Capillary 96 65 - 99 mg/dL   Comment 1 Notify RN    Comment 2 Document in Chart   Glucose, capillary     Status: Abnormal   Collection Time: 03/15/15  9:55 PM  Result Value Ref Range   Glucose-Capillary 119 (H) 65 - 99 mg/dL   Comment 1 Notify RN    Comment 2 Document in Chart   Comprehensive metabolic panel     Status: Abnormal   Collection Time: 03/16/15  4:08 AM  Result Value Ref Range   Sodium 137 135 - 145 mmol/L   Potassium 4.3 3.5 - 5.1 mmol/L    Comment: DELTA CHECK NOTED SPECIMEN HEMOLYZED. HEMOLYSIS MAY AFFECT INTEGRITY OF RESULTS.    Chloride 101 101 - 111 mmol/L   CO2 28 22 - 32 mmol/L   Glucose, Bld 94 65 - 99 mg/dL   BUN 8 6 - 20 mg/dL   Creatinine, Ser 0.86 0.44 - 1.00 mg/dL   Calcium 9.1 8.9 -   10.3 mg/dL   Total Protein 6.4 (L) 6.5 - 8.1 g/dL   Albumin 3.3 (L) 3.5 - 5.0 g/dL   AST 24 15 - 41 U/L   ALT 17 14 - 54 U/L   Alkaline Phosphatase 56 38 - 126 U/L   Total Bilirubin 1.0 0.3 - 1.2 mg/dL   GFR calc non Af Amer >60 >60 mL/min   GFR calc Af Amer >60 >60 mL/min    Comment: (NOTE) The eGFR has been calculated using the CKD EPI equation. This calculation has not been validated in all clinical situations. eGFR's persistently <60 mL/min signify possible Chronic Kidney Disease.    Anion gap 8 5 - 15  Glucose, capillary     Status: Abnormal   Collection Time: 03/16/15  6:26 AM  Result Value Ref Range   Glucose-Capillary 110 (H) 65 - 99 mg/dL   Comment 1 Notify RN    Comment 2 Document in Chart     No results found.  Review of Systems  All other systems reviewed and are negative. The  patient currently denies nausea, vomiting, fever, chest pain, shortness of breath, orthopnea, dizziness, PND, cough, congestion, abdominal pain.  Blood pressure 130/95, pulse 69, temperature 97.7 F (36.5 C), temperature source Oral, resp. rate 16, height 5' 2" (1.575 m), weight 163 lb 2.3 oz (74 kg), SpO2 100 %. Physical Exam  Nursing note and vitals reviewed. Constitutional: She appears well-developed and well-nourished. No distress.  HENT:  Head: Normocephalic and atraumatic.  Eyes: EOM are normal. Pupils are equal, round, and reactive to light.  Neck: Normal range of motion.  Cardiovascular: Normal rate, regular rhythm, S1 normal and S2 normal.   No murmur heard. Pulses:      Radial pulses are 2+ on the right side, and 2+ on the left side.  Respiratory: Effort normal and breath sounds normal. She has no wheezes. She has no rales.  GI: Soft. Bowel sounds are normal. She exhibits no distension. There is no tenderness.  Musculoskeletal: She exhibits no edema.  Neurological: She is alert. She exhibits normal muscle tone.   + Expressive aphasia  Skin: Skin is warm and dry.  Psychiatric: She has a normal mood and affect.    Assessment/Plan: Principal Problem:   Acute ischemic stroke [facility]) Active Problems:   Hyperlipidemia   Essential hypertension   Stroke [facility])   Dysarthria  44 year old female with history of hyperlipidemia, hypertension, IBS, depression and asthma. She presented with a dominant left MCA small cortical infarct with an embolic pattern secondary to unknown source.   Loop recorder implant procedure was explained along with the risks. A picture of the device was shown to the patient.  She agrees to proceed.  Tarri Fuller, PA-C 03/17/2015, 10:54 AM    I have seen and examined this patient with Tarri Fuller.  Agree with above, note added to reflect my findings.  On exam, regular rhythm, no murmurs, lungs clear.  Patient has no palpitations.  Presented with stroke, TEE  negative.  Amber [name] plan on LINQ to further evaluate for atrial fibrillation.    Amber [name] M. Meng Winterton MD 03/17/2015 1:58 PM

## 2015-03-17 NOTE — Care Management Note (Addendum)
Case Management Note  Patient Details  Name: Amber [name] MRN: [id number] Date of Birth: 1971-04-13  Subjective/Objective:                    Action/Plan: Patient being discharged today after loop recorder implantation. Dr Susie Cassette ordered outpatient PT/OT/ST. CM spoke with the patient and her mother about Neurorehabilitation. Patient interested in going to the Neurorehabilitation in [place]. Patient does not have insurance but is willing to private pay at this time per patients mother. Orders entered into EPIC. Bedside RN updated.   Expected Discharge Date:                  Expected Discharge Plan:  Home/Self Care  In-House Referral:     Discharge planning Services     Post Acute Care Choice:    Choice offered to:     DME Arranged:    DME Agency:     HH Arranged:    HH Agency:     Status of Service:  In process, will continue to follow  Medicare Important Message Given:    Date Medicare IM Given:    Medicare IM give by:    Date Additional Medicare IM Given:    Additional Medicare Important Message give by:     If discussed at Long Length of Stay Meetings, dates discussed:    Additional Comments:  Kermit Balo, RN 03/17/2015, 2:45 PM

## 2015-03-18 ENCOUNTER — Encounter (HOSPITAL_COMMUNITY): Payer: Self-pay | Admitting: Cardiology

## 2015-03-18 ENCOUNTER — Telehealth: Payer: Self-pay | Admitting: *Deleted

## 2015-03-18 LAB — ANCA TITERS
Atypical P-ANCA titer: <1:20 {titer}
C-ANCA: <1:20 {titer}

## 2015-03-18 LAB — LUPUS ANTICOAGULANT PANEL
DRVVT: 25.3 s (ref 0.0–55.1)
PTT LA: 39.7 s (ref 0.0–50.0)

## 2015-03-18 NOTE — Telephone Encounter (Signed)
Transition Care Management Follow-up Telephone Call   Date discharged? 03/17/15   How have you been since you were released from the hospital? Still having some pain, strength is improving, still with slurred speech   Do you understand why you were in the hospital? yes   Do you understand the discharge instructions? yes   Where were you discharged to? Home   Items Reviewed:  Medications reviewed: yes  Allergies reviewed: yes  Dietary changes reviewed: no  Referrals reviewed: yes, PT/OT/SLP, neurology   Functional Questionnaire:   Activities of Daily Living (ADLs):   She states they are independent in the following: ambulation, bathing and hygiene, feeding, continence, grooming, toileting and dressing States they require assistance with the following: None   Any transportation issues/concerns?: no   Any patient concerns? no   Confirmed importance and date/time of follow-up visits scheduled yes, 03/21/15 @ 0930  Provider Appointment booked with Mayra Reel, NP  Confirmed with patient if condition begins to worsen call PCP or go to the ER.  Patient was given the office number and encouraged to call back with question or concerns.  : yes

## 2015-03-19 LAB — CARDIOLIPIN ANTIBODIES, IGG, IGM, IGA
Anticardiolipin IgG: <9 GPL U/mL (ref 0–14)
Anticardiolipin IgM: <9 MPL U/mL (ref 0–12)

## 2015-03-20 LAB — ALPHA GALACTOSIDASE: Alpha galactosidase, serum: 51.6 nmol/hr/mg prt (ref 28.0–80.0)

## 2015-03-21 ENCOUNTER — Ambulatory Visit (INDEPENDENT_AMBULATORY_CARE_PROVIDER_SITE_OTHER): Payer: Medicaid Other | Admitting: Primary Care

## 2015-03-21 ENCOUNTER — Encounter: Payer: Self-pay | Admitting: Primary Care

## 2015-03-21 VITALS — BP 126/84 | HR 87 | Temp 98.1°F | Ht 62.0 in | Wt 163.8 lb

## 2015-03-21 DIAGNOSIS — I10 Essential (primary) hypertension: Secondary | ICD-10-CM

## 2015-03-21 DIAGNOSIS — E785 Hyperlipidemia, unspecified: Secondary | ICD-10-CM

## 2015-03-21 DIAGNOSIS — I639 Cerebral infarction, unspecified: Secondary | ICD-10-CM | POA: Diagnosis not present

## 2015-03-21 MED ORDER — HYDROCHLOROTHIAZIDE 25 MG PO TABS: 25.0000 mg | ORAL_TABLET | Freq: Every day | ORAL | Status: DC

## 2015-03-21 NOTE — Progress Notes (Signed)
Subjective:    Patient ID: Amber [name], female    DOB: 12-10-1970, 44 y.o.   MRN: [id number]  HPI  Amber [name] is a 44 year old female who presents today for hospital follow up.  She presented to [facility] Emergency Department via EMS on 03/14/15 with sudden onset of pain and numbness to left side of face and tongue, slurred speech, and hand tingling while at rest. 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 She underwent various testing including MRI, CTA, TCD bubble study, Echo andTEE. All imaging and labs were reviewed today. Upon discharge she was advised to follow up with Neurology and physical/speech/occupational therapy, abide by a low sodium/heart healthy diet, and to increase activity slowly. She was discharged on 03/17/15.  Since her discharge from the hospital she's been contacted by speech therapy with [facility]. She has yet to be contacted by Dr. Marlis Edelson office for follow up. She has had improvement in her speech and strength overall. She denies chest pain, dizziness. She has had several headaches mentioned below.  2) Essential Hypertension: Elevated during hospitalization. She is checking her blood pressure twice daily at home and is getting on average 130-150/90-100's. Wednesday this week she developed a headache. Denies dizziness, chest pain.  BP Readings from Last 3 Encounters:  03/21/15 126/84  03/17/15 144/86  09/27/14 128/88     Review of Systems  Respiratory: Negative for cough and shortness of breath.   Cardiovascular: Negative for chest pain.  Neurological: Positive for speech difficulty, weakness and headaches. Negative for dizziness.       Past Medical History  Diagnosis Date  . Asthma   . Depression   . Headache   . Hyperlipidemia   . Hypertension   . UTI (lower urinary tract infection)   . IBS (irritable bowel syndrome)   . Migraine     Social History    Social History  . Marital Status: Married    Spouse Name: N/A  . Number of Children: N/A  . Years of Education: N/A   Occupational History  . Not on file.   Social History Main Topics  . Smoking status: Never Smoker   . Smokeless tobacco: Not on file  . Alcohol Use: 0.0 oz/week    0 Standard drinks or equivalent per week     Comment: rarely  . Drug Use: No  . Sexual Activity: Not on file   Other Topics Concern  . Not on file   Social History Narrative   Originally from [place]   Family lives up here in [place]   Has one daughter.   Enjoys spending time shopping and spending time with family        Past Surgical History  Procedure Laterality Date  . Abdominal hysterectomy  10/2006  . Toe surgery      Left 2nd metatarsal  . Cesarean section  2005  . Ep implantable device N/A 03/17/2015    Procedure: Loop Recorder Insertion;  Surgeon: Will Jorja Loa, MD;  Location: [facility] INVASIVE CV LAB;  Service: Cardiovascular;  Laterality: N/A;  . Tee without cardioversion N/A 03/17/2015    Procedure: TRANSESOPHAGEAL ECHOCARDIOGRAM (TEE);  Surgeon: Chilton Si, MD;  Location: [facility] ENDOSCOPY;  Service: Cardiovascular;  Laterality: N/A;    Family History  Problem Relation Age of Onset  . Arthritis Father   . Diabetes Father   . Hypertension Father   .  Hyperlipidemia Father   . Hyperlipidemia Mother   . Heart attack Maternal Grandfather   . Stroke Paternal Grandfather     Allergies  Allergen Reactions  . Cephalexin Rash    Current Outpatient Prescriptions on File Prior to Visit  Medication Sig Dispense Refill  . aspirin 325 MG tablet Take 1 tablet (325 mg total) by mouth daily. 60 tablet 0  . aspirin-acetaminophen-caffeine (EXCEDRIN MIGRAINE) 250-250-65 MG tablet Take 2 tablets by mouth every 6 (six) hours as needed for headache or migraine.    Marland Kitchen atorvastatin (LIPITOR) 40 MG tablet Take 1 tablet (40 mg total) by mouth daily at 6 PM. 30 tablet 0  .  senna-docusate (SENOKOT-S) 8.6-50 MG tablet Take 1 tablet by mouth 2 (two) times daily. 30 tablet 0   No current facility-administered medications on file prior to visit.    BP 126/84 mmHg  Pulse 87  Temp(Src) 98.1 F (36.7 C) (Oral)  Ht  (1.575 m)  Wt 163 lb 12.8 oz (74.299 kg)  BMI 29.95 kg/m2  SpO2 98%    Objective:   Physical Exam  Constitutional: She is oriented to person, place, and time. She appears well-nourished.  Eyes: Pupils are equal, round, and reactive to light.  Cardiovascular: Normal rate and regular rhythm.   Pulmonary/Chest: Effort normal and breath sounds normal.  Neurological: She is alert and oriented to person, place, and time. No cranial nerve deficit. Coordination normal.  No facial drooping, arm drift, or slurred speech noted. She does have slower speech but is able to form complete sentences.  Skin: Skin is warm and dry.  Psychiatric: She has a normal mood and affect.          Assessment & Plan:  TCM Visit:  Admitted to [facility] on 03/14/15 with diagnosis of acute left frontal lobe stroke. Underwent a multitude of testing which was all reviewed today. She has been contacted by speech therapy, but not from neurologist. Neurologist's name and phone number provided to patient. She is to call them if she doesn't hear from them by mid next week. Overall improvement, speech slow but complete. Neuro exam mostly unremarkable. Discussed healthy lifestyle. Started HCTZ for hypertension. Follow up in 2 weeks for re-evaluation of BP.

## 2015-03-21 NOTE — Patient Instructions (Signed)
Start Hydrochlorothiazide medication for blood pressure. Take 1 tablet by mouth daily.  Continue to check your blood pressure daily, around the same time of day, for the next 2 weeks. Ensure that you have rested for 30 minutes prior to checking your blood pressure. Record your readings and bring them to your next visit.  Please call me if you consistently get readings below 100/60.  Follow up in 2 weeks for re-evaluation of blood pressure.  It was a pleasure to see you today!

## 2015-03-21 NOTE — Assessment & Plan Note (Signed)
Managed on atorvastatin 40 mg daily. Continue same. LFT's WNL from recent labs. Will recheck lipids in 3 months.

## 2015-03-21 NOTE — Assessment & Plan Note (Signed)
Elevated readings throughout hospitalization. Start HCTZ 25 mg daily. Will check BMP next visit. Will have her record readings for 2 weeks and follow back up in the office. She is to notify me if readings fall below 100/60 or remain above 140/90. Follow up in 2 weeks.

## 2015-03-21 NOTE — Assessment & Plan Note (Signed)
Left frontal lobe stroke on 03/14/15. Reviewed hospital notes, procedures, labs, imaging. She is to follow up with Dr. Pearlean Brownie in December. Will start HCTZ for hypertension today with close follow up. Neuro exam mostly unremarkable today.

## 2015-03-25 ENCOUNTER — Telehealth: Payer: Self-pay | Admitting: Primary Care

## 2015-03-25 NOTE — Telephone Encounter (Signed)
TELEPHONE ADVICE RECORD [facility] Medical Call Center  Patient Name: Amber [name]  Gender: Female  DOB: 12/26/1970   Age: 44 Y 6 M 3 D  Return Phone Number: [phone] (Primary)  Address: [address]   City/State/Zip: [place] [address]   Client [facility] Day - Client  Client Site [facility] - Day  Contact Type Call  Call Type Triage / Clinical  Relationship To Patient Self  Appointment Disposition EMR Patient Refused Appointment  Info pasted into Epic Yes  Return Phone Number [phone] (Primary)  Chief Complaint Unclassified Symptom  Initial Comment Caller states that had a stroke on the 13th of this month. States feels really tired and but can't go to sleep. Doesn't know if it is the medication. States that when tries to go to sleep at night is restless. Has stairs where she lives and and they make her tired or some times when she walks gets tired. Wants to know if should be talking vitamins.  PreDisposition Home Care   Nurse Assessment  Nurse: Odis Luster, RN, Bjorn Loser Date/Time Lamount Cohen Time): 03/25/2015 4:31:33 PM  Confirm and document reason for call. If symptomatic, describe symptoms. ---Caller states that had a stroke on the 13th of this month. States feels really tired and but can't go to sleep. Doesn't know if it is the medication. States that when tries to go to sleep at night is restless. Has stairs where she lives and and they make her tired or some times when she walks gets tired. Wants to know if should be talking vitamins. Reports that she has to take rest periods thru out the day. Reports that she saw MD on Friday of last week. BP readings are good, she denies dizziness but feels extra tired. Wondering if this is related to her medications. Reports that she is taking meds: Hydrochlorothiazide (BP); ASA 325 mg; Senaa Plus tablet; lipitor Advised that BP med side effect is tiredness, she is having problems sleeping.   Has the patient traveled out of the country within the last 30 days? ---Not Applicable  Does the patient have any new or worsening symptoms? ---Yes  Will a triage be completed? ---Yes  Related visit to physician within the last 2 weeks? ---Yes  Does the PT have any chronic conditions? (i.e. diabetes, asthma, etc.) ---Yes  List chronic conditions. ---stroke last week  Did the patient indicate they were pregnant? ---No     Guidelines      Guideline Title Affirmed Question Affirmed Notes Nurse Date/Time (Eastern Time)  Insomnia [1] Insomnia persists > 1 week AND [2] following Insomnia Care Advice  Odis Luster, RN, Bjorn Loser 03/25/2015 4:38:28 PM   Disp. Time Lamount Cohen Time) Disposition Final User          03/25/2015 4:44:49 PM See PCP When Office is Open (within 3 days) Yes Odis Luster, RN, Juliene Pina Understands: Yes  Disagree/Comply: Disagree  Disagree/Comply Reason: Wait and see   Care Advice Given Per Guideline      SEE PCP WITHIN 3 DAYS: * You need to be seen within 2 or 3 days. Call your doctor during regular office hours and make an appointment. An urgent care center is often the best source of care if your doctor's office is closed or you can't get an appointment. NOTE: If office will be open tomorrow, tell caller to call then, not in 3 days. TIPS FOR GOOD SLEEP - YOUR BEDROOM: *  Keep bedroom temperature cool, not warm or cold. * Keep bedroom quiet and dark. * Use a comfortable mattress. CALL BACK IF: * You become worse. CARE ADVICE given per Insomnia (Adult) guideline.   After Care Instructions Given     Call Event Type User Date / Time Description        Comments  User: Marlyce Huge, RN Date/Time Lamount Cohen Time): 03/25/2015 4:46:23 PM  Caller reports that she would prefer to try and take some Melatonin to see if this helps with sleep before she comes into see MD. She also reports that she is having some issures with her insurance, but they should be straightened out by her  next scheduled appt. She will call for new or worsening symptoms.

## 2015-03-26 ENCOUNTER — Telehealth: Payer: Self-pay | Admitting: Primary Care

## 2015-03-26 NOTE — Telephone Encounter (Signed)
PLEASE NOTE: All timestamps contained within this report are represented as [place] Standard Time. CONFIDENTIALTY NOTICE: This fax transmission is intended only for the addressee. It contains information that is legally privileged, confidential or otherwise protected from use or disclosure. If you are not the intended recipient, you are strictly prohibited from reviewing, disclosing, copying using or disseminating any of this information or taking any action in reliance on or regarding this information. If you have received this fax in error, please notify us immediately by telephone so that we can arrange for its return to [place]. Phone: [phone], Toll-Free: [phone], Fax: [phone] Page: 1 of 2 Call Id: [id number] [facility] Day - Client TELEPHONE ADVICE RECORD [facility] Medical Call Center Patient Name: Amber [name] Gender: Female DOB: 04-02-1971 Age: 44 Y 6 M 3 D Return Phone Number: [phone] (Primary) Address: [address] City/State/Zip: [place] [address] Client [facility] Day - Client Client Site [facility] [place] - Day Contact Type Call Call Type Triage / Clinical Relationship To Patient Self Appointment Disposition EMR Patient Refused Appointment Info pasted into Epic Yes Return Phone Number [phone] (Primary) Chief Complaint Unclassified Symptom Initial Comment Caller states that had a stroke on the 13th of this month. States feels really tired and but can't go to sleep. Doesn't know if it is the medication. States that when tries to go to sleep at night is restless. Has stairs where she lives and and they make her tired or some times when she walks gets tired. Wants to know if should be talking vitamins. PreDisposition Home Care Nurse Assessment Nurse: Odis Luster, RN, Bjorn Loser Date/Time Lamount Cohen Time): 03/25/2015 4:31:33 PM Confirm and document reason for call. If symptomatic, describe  symptoms. ---Caller states that had a stroke on the 13th of this month. States feels really tired and but can't go to sleep. Doesn't know if it is the medication. States that when tries to go to sleep at night is restless. Has stairs where she lives and and they make her tired or some times when she walks gets tired. Wants to know if should be talking vitamins. Reports that she has to take rest periods thru out the day. Reports that she saw MD on Friday of last week. BP readings are good, she denies dizziness but feels extra tired. Wondering if this is related to her medications. Reports that she is taking meds: Hydrochlorothiazide (BP); ASA 325 mg; Senaa Plus tablet; lipitor Advised that BP med side effect is tiredness, she is having problems sleeping. Has the patient traveled out of the country within the last 30 days? ---Not Applicable Does the patient have any new or worsening symptoms? ---Yes Will a triage be completed? ---Yes Related visit to physician within the last 2 weeks? ---Yes Does the PT have any chronic conditions? (i.e. diabetes, asthma, etc.) ---Yes List chronic conditions. ---stroke last week Did the patient indicate they were pregnant? ---No PLEASE NOTE: All timestamps contained within this report are represented as [place] Standard Time. CONFIDENTIALTY NOTICE: This fax transmission is intended only for the addressee. It contains information that is legally privileged, confidential or otherwise protected from use or disclosure. If you are not the intended recipient, you are strictly prohibited from reviewing, disclosing, copying using or disseminating any of this information or taking any action in reliance on or regarding this information. If you have received this fax in error, please notify us immediately by telephone so that we can arrange for its return  to [place]. Phone: [phone], Toll-Free: [phone], Fax: [phone] Page: 2 of 2 Call Id:  [id number] Guidelines Guideline Title Affirmed Question Affirmed Notes Nurse Date/Time Lamount Cohen Time) Insomnia [1] Insomnia persists > 1 week AND [2] following Insomnia Care Advice Kerrin Champagne 03/25/2015 4:38:28 PM Disp. Time Lamount Cohen Time) Disposition Final User 03/25/2015 4:44:49 PM See PCP When Office is Open (within 3 days) Yes Odis Luster, RN, Juliene Pina Understands: Yes Disagree/Comply: Disagree Disagree/Comply Reason: Wait and see Care Advice Given Per Guideline SEE PCP WITHIN 3 DAYS: * You need to be seen within 2 or 3 days. Call your doctor during regular office hours and make an appointment. An urgent care center is often the best source of care if your doctor's office is closed or you can't get an appointment. NOTE: If office will be open tomorrow, tell caller to call then, not in 3 days. TIPS FOR GOOD SLEEP - YOUR BEDROOM: * Keep bedroom temperature cool, not warm or cold. * Keep bedroom quiet and dark. * Use a comfortable mattress. CALL BACK IF: * You become worse. CARE ADVICE given per Insomnia (Adult) guideline. After Care Instructions Given Call Event Type User Date / Time Description Comments User: Marlyce Huge, RN Date/Time Lamount Cohen Time): 03/25/2015 4:46:23 PM Caller reports that she would prefer to try and take some Melatonin to see if this helps with sleep before she comes into see MD. She also reports that she is having some issures with her insurance, but they should be straightened out by her next scheduled appt. She will call for new or worsening symptoms.

## 2015-03-31 ENCOUNTER — Other Ambulatory Visit: Payer: Self-pay

## 2015-03-31 DIAGNOSIS — K5900 Constipation, unspecified: Secondary | ICD-10-CM | POA: Insufficient documentation

## 2015-03-31 NOTE — Patient Outreach (Addendum)
[facility]) Care Management  03/31/2015  Amber [name] Oct 07, 1970 [id number]  [facility] Stroke Program Red on Emmi Dashboard Alert: Sunday Day #9 03/30/15.   Questions/problems with meds? Yes  Outreach call #1 to patient.  Patient reached.  Screening and Initial Assessment completed.   Social:  Lives in her home with 42yo daughter and patient's father (retired Cytogeneticist).   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 Mobility:  Ambulating with no assistive devices but gets fatigued. Outpatient Rehab Program to start 04/01/2015:  PT, OT, and ST services. Caregiver:  Father  Transportation:  Father DME:  BP cuff Insurance:  Programmer, applications and [employer].    States In process of applying for Medicaid.  Application completed while Inpatient at [facility].  States [facility] Financial Advisor contacting patient weekly with updates.   CVA (03/15/15) Home BP:  Highest # 139/94 and lowest #124/90. Weight: 164.  Insomnia: States she is not sleeping and using OTC Melatonin.  States LT h/o issues with insomnia but has worsened since her CVA.   Patient states she has appt Thursday 04/03/15 with her MD and will discuss symptom management of both insomnia and constipation at that time     Medications: Less than 10 Patient states the issue she was having with medications was related to constipation.  States she is taking the Senokot but this is not helping her.  States she has a h/o IBS and endometriosis which has caused her long term constipation issues.  States new medications may have also changed her symptoms.   States her father purchased OTC medication:  Castor Oil and patient was able to have BM.   Flu Vaccine:  None Pneumonia  03/17/2015 Prior to hospital discharge.  Consent: Patient agreed to [facility] services  Plan.  RN CM confirmed patient has read Stroke Early Stages of Recovery and has no questions.  RN CM  reviewed signs & symptoms of stroke.   RN CM advised in 911 should patient have any symptoms of stroke.  RN CM will continue [facility] Stroke Program follow-up within one week.  Emmi Education Mailed to patient 03/31/2015 -High Blood Pressure (Hypertension): What You Can Do -Low-Salt Diet -What You Can Do To Prevent A second Stroke   RN CM advised to please notify MD of any changes in condition prior to scheduled appt's.   RN CM provided contact name and # [phone] or main office # [phone] and 24-hour nurse line # [phone].  RN CM confirmed patient is aware of 911 services for urgent emergency needs.  Donato Schultz, RN, BSN, [facility], CCM  [facility] [employer] Management Coordinator [phone] Direct [phone] Cell [phone] Office [phone] Fax

## 2015-03-31 NOTE — Patient Outreach (Signed)
[facility]) Care Management  03/31/2015  Quenia Nuckols 02-Jul-1970 [id number]   Triggered RED on EMMI Stroke Dashboard, assigned Donato Schultz, RN to outreach for [facility] Care Management services.  Thanks, Corrie Mckusick. Sharlee Blew [facility] Care Management [facility] CM Assistant Phone: [phone] Fax: [phone]

## 2015-04-01 ENCOUNTER — Encounter: Payer: Self-pay | Admitting: Physical Therapy

## 2015-04-01 ENCOUNTER — Ambulatory Visit: Payer: Medicaid Other | Admitting: Speech Pathology

## 2015-04-01 ENCOUNTER — Encounter: Payer: Self-pay | Admitting: Occupational Therapy

## 2015-04-01 ENCOUNTER — Ambulatory Visit: Payer: Medicaid Other | Admitting: Occupational Therapy

## 2015-04-01 ENCOUNTER — Ambulatory Visit: Payer: Medicaid Other | Attending: Internal Medicine | Admitting: Physical Therapy

## 2015-04-01 VITALS — BP 115/91 | HR 77

## 2015-04-01 DIAGNOSIS — R479 Unspecified speech disturbances: Secondary | ICD-10-CM | POA: Diagnosis present

## 2015-04-01 DIAGNOSIS — M6281 Muscle weakness (generalized): Secondary | ICD-10-CM

## 2015-04-01 DIAGNOSIS — R42 Dizziness and giddiness: Secondary | ICD-10-CM | POA: Insufficient documentation

## 2015-04-01 DIAGNOSIS — I69354 Hemiplegia and hemiparesis following cerebral infarction affecting left non-dominant side: Secondary | ICD-10-CM | POA: Insufficient documentation

## 2015-04-01 DIAGNOSIS — I69922 Dysarthria following unspecified cerebrovascular disease: Secondary | ICD-10-CM | POA: Insufficient documentation

## 2015-04-01 DIAGNOSIS — R278 Other lack of coordination: Secondary | ICD-10-CM | POA: Diagnosis present

## 2015-04-01 DIAGNOSIS — R269 Unspecified abnormalities of gait and mobility: Secondary | ICD-10-CM | POA: Diagnosis present

## 2015-04-01 DIAGNOSIS — I69322 Dysarthria following cerebral infarction: Secondary | ICD-10-CM

## 2015-04-01 NOTE — Therapy (Signed)
[facility] [address] [place], [place], [address] Phone: [phone]   Fax:  [phone]  Occupational Therapy Evaluation  Patient Details  Name: Amber [name] MRN: [id number] Date of Birth: 1970-06-02 Referring Provider: Dr Pearlean Brownie  Encounter Date: 04/01/2015      OT End of Session - 04/01/15 1721    Visit Number 1   Number of Visits 2   Date for OT Re-Evaluation 04/29/15   Authorization Type Medicaid pending - pt should be approved for eval plus 8 tx. OT to use only 1 tx and allow ST to use remaining 7 visits   OT Start Time 1445   OT Stop Time 1529   OT Time Calculation (min) 44 min   Activity Tolerance Patient tolerated treatment well      Past Medical History  Diagnosis Date  . Asthma   . Depression   . Headache   . Hyperlipidemia   . Hypertension   . UTI (lower urinary tract infection)   . IBS (irritable bowel syndrome)   . Migraine   . Stroke [facility])     Past Surgical History  Procedure Laterality Date  . Abdominal hysterectomy  10/2006  . Toe surgery      Left 2nd metatarsal  . Cesarean section  2005  . Ep implantable device N/A 03/17/2015    Procedure: Loop Recorder Insertion;  Surgeon: Will Jorja Loa, MD;  [facility];  Service: Cardiovascular;  Laterality: N/A;  . Tee without cardioversion N/A 03/17/2015    Procedure: TRANSESOPHAGEAL ECHOCARDIOGRAM (TEE);  Surgeon: Chilton Si, MD;  Location: [facility] ENDOSCOPY;  Service: Cardiovascular;  Laterality: N/A;    Filed Vitals:   04/01/15 1451  BP: 115/91  Pulse: 77    Visit Diagnosis:  Hemiplegia and hemiparesis following cerebral infarction affecting left non-dominant side (HCC) - Plan: Ot plan of care cert/re-cert  Muscle weakness - Plan: Ot plan of care cert/re-cert      Subjective Assessment - 04/01/15 1451    Subjective  I am supposed to take  my BP 2 times per day - they are working on my medications   Pertinent History see epic, MRI shows new L frontal stroke on10/14/2016 with old parital and tiny remote L cerebellar strokes. HTN MONITOR BP!!!, asthma, depression   Patient Stated Goals to be able to be independent and do the things I did before   Currently in Pain? No/denies  occassional headache but not today           [facility] OT Assessment - 04/01/15 1455    Assessment   Diagnosis L frontal stroke   Referring Provider Dr Pearlean Brownie   Onset Date 03/14/15   Prior Therapy PT, OT and ST in acute care   Precautions   Precautions Fall   Precaution Comments loop recorder   Restrictions   Weight Bearing Restrictions No   Balance Screen   Has the patient fallen in the past 6 months No   Home  Environment   Family/patient expects to be discharged to: Other (comment)  town house   Home Layout Two level   Bathroom Museum/gallery exhibitions officer   Additional Comments half bath on first floor , 2 baths and 2 bedrooms on second floor.  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   ADL   Eating/Feeding Independent   Grooming Independent   Upper Body Bathing Independent   Lower Body Bathing Independent   Upper Body Dressing Independent   Lower Body Dressing Independent   Community education officer Independent   ADL comments Pt has completed her medicaid application and is Medicaid pending   IADL   Shopping Needs to be accompanied on any shopping trip  due to fatigue and balance only   Light Housekeeping Performs light daily tasks such as dishwashing, bed making   Meal Prep Does not utilize stove or oven  pt is not cooking at this time yet    Engineer, drilling on family or friends for transportation   Medication Management Is responsible for taking medication in correct dosages at correct time   Development worker, community financial matters independently (budgets, writes checks, pays rent, bills goes to bank), collects and keeps track of income   Mobility   Mobility Status Needs assist   Mobility Status Comments supervision for mobility in the community   Written Expression   Dominant Hand Right   Handwriting 100% legible   Vision - History   Baseline Vision Wears glasses only for reading   Additional Comments Intermittent blurry vision in r eye mostly when she is fatigued   Vision Assessment   Eye Alignment Within Functional Limits   Ocular Range of Motion Within Functional Limits   Visual Fields No apparent deficits   Activity Tolerance   Activity Tolerance --  30 minutes and then needs a rest   Activity Tolerance Comments Pt reports fatigue has been a big factor   Cognition   Overall Cognitive Status Within Functional Limits for tasks assessed   Memory --  pt reports she may be having some memory - TBA by ST   Sensation   Light Touch Appears Intact   Hot/Cold Appears Intact   Proprioception Appears Intact   Coordination   Gross Motor Movements are Fluid and Coordinated Yes   Fine Motor Movements are Fluid and Coordinated Yes   9 Hole Peg Test Left   Left 9 Hole Peg Test 22.16   Tone   Assessment Location Left Upper Extremity   ROM / Strength   AROM / PROM / Strength AROM;Strength   AROM   Overall AROM  Within functional limits for tasks performed   Overall AROM Comments BUE"s   Strength   Overall Strength Within functional limits for tasks performed   Overall Strength Comments BUE"s except grip strength - see below   Hand Function   Right Hand Gross Grasp Functional   Right Hand Grip (lbs) 80 pounds   Left Hand Gross Grasp Functional   Left Hand Grip (lbs) 60 pounds   LUE Tone   LUE Tone Within  Functional Limits                              OT Long Term Goals - 04/01/15 1714    OT LONG TERM GOAL #1   Title Pt will be mod I wtih HEP to address grip strength in LUE - 04/29/2015   Baseline dependent   Status New               Plan - 04/01/15 1715  Clinical Impression Statement Pt is a 44 year old female s/p L frontal CVA on 03/14/2015. Pt was hospitalized from 03/14/2015- 03/17/2015 . 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 Pt presents with the following defict that impacts functional use of her L non dominant hand:  L non dominant hemiplegia, decreased grip strength, decreased functional use of L hand, decreased activity tolerance, decreased balance. Pt can benefit from brief period of skilled OT to address L grip strength and functional use - PT to address activity tolerance and balance.     Pt will benefit from skilled therapeutic intervention in order to improve on the following deficits (Retired) Decreased activity tolerance;Decreased balance;Decreased endurance;Decreased strength;Impaired UE functional use   Rehab Potential Excellent   OT Frequency 1x / week   OT Duration Other (comment)  one week   OT Treatment/Interventions Therapeutic exercise;Therapeutic activities   Plan complete instruciton for HEP for grip/hand strength   Recommended Other Services Pt will have limited visits therefore will only seek 1 OT visit so that pt can benefit from additional ST visits.   Consulted and Agree with Plan of Care Patient        Problem List Patient Active Problem List   Diagnosis Date Noted  . Constipation 03/31/2015  . Dysarthria   . Acute ischemic stroke (HCC) 03/14/2015  . Stroke (HCC)   . Insomnia 09/29/2014  . IBS (irritable bowel syndrome) 09/29/2014  . Hyperlipidemia 09/29/2014  . Essential hypertension 09/29/2014    Norton Pastel, OTR/L 04/01/2015, 5:25 PM  [facility] [address] [place], [place], [address] Phone: [phone]   Fax:  [phone]  Name: Amber [name] MRN: [id number] Date of Birth: Jan 24, 1971

## 2015-04-01 NOTE — Therapy (Signed)
[facility] [address] [place], [place], [address] Phone: [phone]   Fax:  [phone]  Speech Language Pathology Evaluation  Patient Details  Name: Amber [name] MRN: [id number] Date of Birth: September 17, 1970 Referring Provider: Dr. Kenyon Ana  Encounter Date: 04/01/2015      End of Session - 04/01/15 1447    Visit Number 1   Number of Visits 16   Date for SLP Re-Evaluation 05/27/15   Authorization Type medicaid pending - will request visits based on PT/OT needs, likely 10 over 12 weeks   SLP Start Time 1402   SLP Stop Time  1445   SLP Time Calculation (min) 43 min   Activity Tolerance Patient tolerated treatment well      Past Medical History  Diagnosis Date  . Asthma   . Depression   . Headache   . Hyperlipidemia   . Hypertension   . UTI (lower urinary tract infection)   . IBS (irritable bowel syndrome)   . Migraine   . Stroke [facility])     Past Surgical History  Procedure Laterality Date  . Abdominal hysterectomy  10/2006  . Toe surgery      Left 2nd metatarsal  . Cesarean section  2005  . Ep implantable device N/A 03/17/2015    Procedure: Loop Recorder Insertion;  Surgeon: Will Jorja Loa, MD;  [facility] LAB;  Service: Cardiovascular;  Laterality: N/A;  . Tee without cardioversion N/A 03/17/2015    Procedure: TRANSESOPHAGEAL ECHOCARDIOGRAM (TEE);  Surgeon: Chilton Si, MD;  Location: [facility] ENDOSCOPY;  Service: Cardiovascular;  Laterality: N/A;    There were no vitals filed for this visit.  Visit Diagnosis: Dysarthria due to recent stroke      Subjective Assessment - 04/01/15 1409    Subjective "She told me to keep talking"            SLP Evaluation [facility] - 04/01/15 1407    SLP Visit Information   SLP Received On 04/01/15   Referring Provider Dr. Kenyon Ana   Onset Date 03/14/15   Medical Diagnosis CVA   Pain Assessment   Pain Location --   General  Information   Other Pertinent Information 44 y.o. female with a past medical history significant for hyperlipidemia, history of preeclampsia, and migraines who presents with slurred speech.  MRI showed Acute ischemic nonhemorrhagic linear infarct involving the posterior left frontal lobe cortical gray matter and subcortical white matter.    Mobility Status walks independently, has PT eval   Prior Functional Status   Cognitive/Linguistic Baseline Within functional limits   Type of Home House    Lives With Daughter;Family   Pain Assessment   Pain Assessment No/denies pain   Auditory Comprehension   Overall Auditory Comprehension Appears within functional limits for tasks assessed   Expression   Primary Mode of Expression Verbal   Verbal Expression   Overall Verbal Expression Appears within functional limits for tasks assessed   Oral Motor/Sensory Function   Overall Oral Motor/Sensory Function Appears within functional limits for tasks assessed   Motor Speech   Overall Motor Speech Impaired   Respiration Within functional limits   Phonation Normal   Resonance Within functional limits   Articulation Impaired   Level of Impairment Phrase   Intelligibility Intelligibility reduced   Word 75-100% accurate   Phrase 75-100% accurate   Sentence 75-100% accurate   Conversation 50-74% accurate   Motor Planning Impaired   Level of Impairment Phrase  Motor Speech Errors Aware   Effective Techniques Slow rate;Pause   Phonation WFL   Assessment   Therapy Diagnosis Dysarthria   Clinical Impression Statement [name], a 44 y.o. Female suffered a left frontal CVA ion 03/14/15. She was hospitalized 03/14/15 to 03/17/15. She received skilled ST services in the hospital with OP ST recommended upon d/c. Pt presents with a mild-moderate neurogenic dysfluency and mild dysarthria.  Language is intact with normal comprehension; good grammatical form; no aphasia.  Pt's output is slow and deliberate with  initial sound repetitions and articulatory distortions.  Longer, multisyllabic words exhibit more difficulty and halting. Dysfluent speech/dysarthria is distracting and intelligibility is judged to be  80% intelligibile duirng simple conversation in quiet environment.  Pt frustrated by speech, but reports improvements since hospitalization.  Recommend pt receive skillled ST to maximize fluency and intelligibility.   SLP Recommendation/Assessment Patient needs continued Speech Lanaguage Pathology Services   Problem List Verbal expression                      ADULT SLP TREATMENT - 04/01/15 1407    Cognitive-Linquistic Treatment   Skilled Treatment Initiated training on slow rate, prolonging phonemes and relaxed articulation - pt required mod A for these strategies at oral reading of simple phrases.Home practice of common phrases (2-3 words) provided           SLP Education - 04/01/15 1451    Education provided Yes   Education Details goals of therapy, compensations for dysfluency, dysarthria          SLP Short Term Goals - 04/01/15 1445    SLP SHORT TERM GOAL #1   Title Pt will utilize compensations for dysarthria/dysfluency during structured speech tasks.   Baseline Pt is not using compensations at prase level    Time 4   Period Weeks   Status New   SLP SHORT TERM GOAL #2   Title Pt will demonstrate fluent speech at phrase level 70% of utterances with mod A   Baseline Pt is fluent less than 20% of phrase level utterances   Time 4   Period Weeks   Status New          SLP Long Term Goals - 04/01/15 1438    SLP LONG TERM GOAL #1   Title Pt will ultize compensations for dysarthria and dysfluency during 10 minute simple conversation,   Baseline Pt not using compensations at phrase level   Time 8   Period Weeks   Status New   SLP LONG TERM GOAL #2   Title Pt will demonstrate fluent speech over 8 minute conversation with min A   Baseline Pt is dysfluent at  phrase/sentence level   Time 8   Period Weeks   Status New          Plan - 04/01/15 1446    Speech Therapy Frequency 2x / week   Duration 1 week  8 weeks, or 10 visits pending medicaid   Treatment/Interventions SLP instruction and feedback;Compensatory strategies;Internal/external aids;Environmental controls;Patient/family education;Functional tasks;Multimodal communcation approach   Potential to Achieve Goals Good   Potential Considerations Financial resources   Consulted and Agree with Plan of Care Patient        Problem List Patient Active Problem List   Diagnosis Date Noted  . Constipation 03/31/2015  . Dysarthria   . Acute ischemic stroke (HCC) 03/14/2015  . Stroke (HCC)   . Insomnia 09/29/2014  . IBS (irritable bowel syndrome) 09/29/2014  .  Hyperlipidemia 09/29/2014  . Essential hypertension 09/29/2014    Couper Juncaj, Radene Journey MS, CCC-SLP 04/01/2015, 2:57 PM  [facility] [address] [place], [place], [address] Phone: [phone]   Fax:  [phone]  Name: Seth Friedlander MRN: [id number] Date of Birth: [birth year]

## 2015-04-01 NOTE — Therapy (Signed)
[facility] [address] [place], [place], [address] Phone: [phone]   Fax:  [phone]  Physical Therapy Evaluation  Patient Details  Name: Amber [name] MRN: [id number] Date of Birth: 09-11-1970 Referring Provider: Micki Riley, MD  Encounter Date: 04/01/2015      PT End of Session - 04/01/15 1413    Visit Number 1   Number of Visits 7  eval + 6 visits   Date for PT Re-Evaluation 05/16/15   Authorization Type Medicaid pending   PT Start Time 1315   PT Stop Time 1400   PT Time Calculation (min) 45 min   Activity Tolerance Patient tolerated treatment well   Behavior During Therapy [facility] for tasks assessed/performed      Past Medical History  Diagnosis Date  . Asthma   . Depression   . Headache   . Hyperlipidemia   . Hypertension   . UTI (lower urinary tract infection)   . IBS (irritable bowel syndrome)   . Migraine   . Stroke [facility])     Past Surgical History  Procedure Laterality Date  . Abdominal hysterectomy  10/2006  . Toe surgery      Left 2nd metatarsal  . Cesarean section  2005  . Ep implantable device N/A 03/17/2015    Procedure: Loop Recorder Insertion;  Surgeon: Will Jorja Loa, MD;  [facility] LAB;  Service: Cardiovascular;  Laterality: N/A;  . Tee without cardioversion N/A 03/17/2015    Procedure: TRANSESOPHAGEAL ECHOCARDIOGRAM (TEE);  Surgeon: Chilton Si, MD;  Location: [facility] ENDOSCOPY;  Service: Cardiovascular;  Laterality: N/A;    There were no vitals filed for this visit.  Visit Diagnosis:  Abnormality of gait - Plan: PT plan of care cert/re-cert  Dizziness and giddiness - Plan: PT plan of care cert/re-cert  Coordination impairment - Plan: PT plan of care cert/re-cert      Subjective Assessment - 04/01/15 1324    Subjective Prior to CVA, pt was completely independent with all mobility. Since sustaining CVA on 03/14/15, reports feeling "pulling...leaning  this way (to R side)" when physically tired, Pt also reported decreased endurance.   Pertinent History Goes by "Amber [name]". CVA (03/14/15), loop recorder placement, HTN, HLD, depression, asthma   Patient Stated Goals "To be able to tolerating walking more; and to keeping my balance without using any devices."   Currently in Pain? No/denies  minimal pain in L chest due to loop recorder placement            [facility] PT Assessment - 04/01/15 0001    Assessment   Medical Diagnosis CVA   Referring Provider Micki Riley, MD   Onset Date/Surgical Date 03/14/15   Precautions   Precautions Fall;Other (comment)   Precaution Comments loop recorder implantation   Balance Screen   Has the patient fallen in the past 6 months No   Home Environment   Living Environment Private residence   Living Arrangements Children   Available Help at Discharge Family   Type of Home House   Home Access Stairs to enter   Entrance Stairs-Number of Steps 8   Entrance Stairs-Rails Can reach both   Prior Function   Level of Independence Independent   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   ROM / Strength  AROM / PROM / Strength Strength   Transfers   Transfers Sit to Stand;Stand to Sit   Sit to Stand 7: Independent   Stand to Sit 7: Independent   Ambulation/Gait   Ambulation/Gait Yes   Ambulation/Gait Assistance 5: Supervision;6: Modified independent (Device/Increase time)   Ambulation/Gait Assistance Details Supervision for high level gait, gait with functional head turns   Ambulation Distance (Feet) 425 Feet   Assistive device None   Gait Pattern Step-through pattern;Decreased arm swing - left;Decreased stride length;Left foot flat;Wide base of support   Functional Gait  Assessment   Gait assessed  Yes   Gait  Level Surface Walks 20 ft in less than 7 sec but greater than 5.5 sec, uses assistive device, slower speed, mild gait deviations, or deviates 6-10 in outside of the 12 in walkway width.   Change in Gait Speed Able to change speed, demonstrates mild gait deviations, deviates 6-10 in outside of the 12 in walkway width, or no gait deviations, unable to achieve a major change in velocity, or uses a change in velocity, or uses an assistive device.   Gait with Horizontal Head Turns Performs head turns smoothly with slight change in gait velocity (eg, minor disruption to smooth gait path), deviates 6-10 in outside 12 in walkway width, or uses an assistive device.   Gait with Vertical Head Turns Performs task with moderate change in gait velocity, slows down, deviates 10-15 in outside 12 in walkway width but recovers, can continue to walk.   Gait and Pivot Turn Pivot turns safely in greater than 3 sec and stops with no loss of balance, or pivot turns safely within 3 sec and stops with mild imbalance, requires small steps to catch balance.  7/10 dizziness    Step Over Obstacle Is able to step over one shoe box (4.5 in total height) without changing gait speed. No evidence of imbalance.   Gait with Narrow Base of Support Is able to ambulate for 10 steps heel to toe with no staggering.   Gait with Eyes Closed Walks 20 ft, slow speed, abnormal gait pattern, evidence for imbalance, deviates 10-15 in outside 12 in walkway width. Requires more than 9 sec to ambulate 20 ft.  veers to L   Ambulating Backwards Walks 20 ft, slow speed, abnormal gait pattern, evidence for imbalance, deviates 10-15 in outside 12 in walkway width.  veers to L   Steps Alternating feet, must use rail.   Total Score 18                   [facility] Adult PT Treatment/Exercise - 04/01/15 0001    Ambulation/Gait   Ambulation Surface Level;Indoor   Gait velocity 2.47 ft/sec   Stairs Yes   Stairs Assistance 6: Modified independent  (Device/Increase time)   Stair Management Technique One rail Right;Alternating pattern;Forwards   Number of Stairs 4   Height of Stairs 6                PT Education - 04/01/15 2216    Education provided Yes   Education Details PT eval findings, goals, and POC.   Person(s) Educated Patient   Methods Explanation   Comprehension Verbalized understanding          PT Short Term Goals - 04/01/15 1425    PT SHORT TERM GOAL #1   Title Pt will perform home exercises with mod I using paper handout to maximize functional gains made in PT. Target date: 04/22/15   PT  SHORT TERM GOAL #2   Title Pt will improve FGA score from 18/30 to 20/30 to indicate improved in dynamic gait stability. Target date: 04/22/15   PT SHORT TERM GOAL #3   Title Pt will improve gait speed from 2.47 ft/sec to > 2.62 ft/sec to reach status of community ambulator. Target date: 04/22/15           PT Long Term Goals - 04/01/15 2218    PT LONG TERM GOAL #1   Title Pt will verbalize understanding of CVA warning signs, pertinent risk factors to prevent future CVA. Target date: 05/13/15   PT LONG TERM GOAL #2   Title Pt will improve FGA score from 18 to 23/30 to indicate decreased fall risk. Target date: 05/13/15   PT LONG TERM GOAL #3   Title Pt will improve gait velocity from 2.47 ft/sec to > / = 3.07 ft/sec to indicate improved efficiency of ambulation. Target date: 05/13/15   PT LONG TERM GOAL #4   Title Pt will independently ambulate x1,000' over unlevel, paved surfaces to indicate safety with community mobility, progress toward PLOF. Target date: 05/13/15               Plan - 04/01/15 1416    Clinical Impression Statement Pt is a 44 y/o F referred to outpatient PT to address functional impairments associated with CVA sustained 03/14/15. PT evaluaton reveals the following impairments: impaired LLE coordination; disequilibrium with functional head turns, which appears to be secondary to central  vestibular impairments; FGA score suggestive of fall risk. Pt will benefit from skilled outpatient PT 1x/week for 6 weeks to address said impairments.   Pt will benefit from skilled therapeutic intervention in order to improve on the following deficits Abnormal gait;Decreased coordination;Decreased balance;Dizziness;Postural dysfunction   Rehab Potential Good   Clinical Impairments Affecting Rehab Potential financial limitations (pt self-pay)   PT Frequency 1x / week   PT Duration 6 weeks   PT Treatment/Interventions ADLs/Self Care Home Management;Gait training;Stair training;Functional mobility training;Therapeutic activities;Therapeutic exercise;Balance training;Neuromuscular re-education;Patient/family education;Vestibular   PT Next Visit Plan high level balance/gait; teach pt compensatory strategies for central vestibular impairments. Initiate HEP.   Consulted and Agree with Plan of Care Patient         Problem List Patient Active Problem List   Diagnosis Date Noted  . Constipation 03/31/2015  . Dysarthria   . Acute ischemic stroke (HCC) 03/14/2015  . Stroke (HCC)   . Insomnia 09/29/2014  . IBS (irritable bowel syndrome) 09/29/2014  . Hyperlipidemia 09/29/2014  . Essential hypertension 09/29/2014    Jorje Guild, PT, DPT [facility] [address] [place], [place], [address] Phone: [phone]   Fax:  [phone] 04/01/2015, 10:38 PM  Name: Amber [name] MRN: [id number] Date of Birth: 1970/06/14

## 2015-04-03 ENCOUNTER — Ambulatory Visit (INDEPENDENT_AMBULATORY_CARE_PROVIDER_SITE_OTHER): Payer: Medicaid Other | Admitting: Primary Care

## 2015-04-03 ENCOUNTER — Encounter: Payer: Self-pay | Admitting: Primary Care

## 2015-04-03 ENCOUNTER — Ambulatory Visit: Payer: Medicaid Other

## 2015-04-03 VITALS — BP 116/84 | HR 110 | Temp 97.7°F | Ht 62.0 in | Wt 159.0 lb

## 2015-04-03 DIAGNOSIS — K589 Irritable bowel syndrome without diarrhea: Secondary | ICD-10-CM | POA: Diagnosis not present

## 2015-04-03 DIAGNOSIS — G4700 Insomnia, unspecified: Secondary | ICD-10-CM | POA: Diagnosis not present

## 2015-04-03 DIAGNOSIS — R5383 Other fatigue: Secondary | ICD-10-CM | POA: Insufficient documentation

## 2015-04-03 DIAGNOSIS — I10 Essential (primary) hypertension: Secondary | ICD-10-CM | POA: Diagnosis not present

## 2015-04-03 DIAGNOSIS — R269 Unspecified abnormalities of gait and mobility: Secondary | ICD-10-CM | POA: Diagnosis not present

## 2015-04-03 DIAGNOSIS — I69322 Dysarthria following cerebral infarction: Secondary | ICD-10-CM

## 2015-04-03 DIAGNOSIS — R4789 Other speech disturbances: Secondary | ICD-10-CM

## 2015-04-03 LAB — CBC
HEMATOCRIT: 46.2 % — AB (ref 36.0–46.0)
Hemoglobin: 15 g/dL (ref 12.0–15.0)
MCHC: 32.5 g/dL (ref 30.0–36.0)
MCV: 86.5 fl (ref 78.0–100.0)
Platelets: 311 10*3/uL (ref 150.0–400.0)
RBC: 5.35 Mil/uL — ABNORMAL HIGH (ref 3.87–5.11)
RDW: 13.1 % (ref 11.5–15.5)
WBC: 6.2 10*3/uL (ref 4.0–10.5)

## 2015-04-03 LAB — VITAMIN D 25 HYDROXY (VIT D DEFICIENCY, FRACTURES): VITD: 29.64 ng/mL — AB (ref 30.00–100.00)

## 2015-04-03 LAB — TSH: TSH: 1.23 u[IU]/mL (ref 0.35–4.50)

## 2015-04-03 LAB — VITAMIN B12: VITAMIN B 12: 1147 pg/mL — AB (ref 211–911)

## 2015-04-03 MED ORDER — TRAZODONE HCL 50 MG PO TABS: 25.0000 mg | ORAL_TABLET | Freq: Every evening | ORAL | Status: DC | PRN

## 2015-04-03 NOTE — Assessment & Plan Note (Signed)
Present intermittently for the past several months, worse since recent stroke. Feeling tired when walking to the car, around the neighbor hood. Discussed that this could be a result of the stroke. CBC in hospital WNL, but will recheck today along with TSH and vitamins b12 and D.

## 2015-04-03 NOTE — Patient Instructions (Signed)
Complete lab work prior to leaving today. I will notify you of your results.  Start Trazodone for insomnia. Take 1/2 to 1  tablet by mouth 1 hour before bedtime for sleep.  You will be contacted regarding your referral to the gastroenterologist.  Please let us know if you have not heard back within one week.   Follow up in 6 weeks for re-evaluation of insomnia.  It was a pleasure to see you today!

## 2015-04-03 NOTE — Progress Notes (Signed)
Subjective:    Patient ID: Amber [name], female    DOB: 1971-01-13, 44 y.o.   MRN: [id number]  HPI  Amber [name] is a 44 year old female who presents today for follow up and multiple concerns.  1) Essential Hypertension: Initiated on HCTZ 25 mg 2 weeks ago due to elevated readings during recent hospitalization for CVA and at home. Since her last visit her blood pressure is stable. She's been checking her BP at home and has been getting readings of 120's/80's on average. Denies headaches, dizziness.  2) Insomnia: Difficulty sleeping intermittently for the past 6+ months and experiencing increased difficulty since her storke. She has difficulty falling asleep and staying asleep. She has been taking Melatonin without improvement. Before her stroke she would have a glass of red wine 3 times weekly to help her sleep.   3) Fatigue: Present for the past 3+ months and moreso since her stroke. She feels fatigued when walking to the car, walking downstairs, when walking around in the neighborhood. She is currently undergoing physical and speech therapy for recent stroke.   4) Constipation: Present intermittently for several months. Currently managed on senna-docusate and is drinking prune juice daily. She has a longstanding history of IBS with bowel resection and had a referral to GI in May but was not able to go. She is requesting for a new referral. Symptoms are no worse than before. She has generalized abdominal pain that is no different than her usual discomfort.  Review of Systems  Constitutional: Positive for fatigue. Negative for fever.  Respiratory: Negative for shortness of breath.   Cardiovascular: Negative for chest pain.  Gastrointestinal: Positive for constipation.  Neurological: Negative for dizziness and headaches.  Psychiatric/Behavioral: Positive for sleep disturbance. The patient is not nervous/anxious.        Past Medical History  Diagnosis Date  . Asthma   . Depression   .  Headache   . Hyperlipidemia   . Hypertension   . UTI (lower urinary tract infection)   . IBS (irritable bowel syndrome)   . Migraine   . Stroke [facility])     Social History   Social History  . Marital Status: Married    Spouse Name: N/A  . Number of Children: N/A  . Years of Education: N/A   Occupational History  . Not on file.   Social History Main Topics  . Smoking status: Never Smoker   . Smokeless tobacco: Not on file  . Alcohol Use: 0.0 oz/week    0 Standard drinks or equivalent per week     Comment: rarely  . Drug Use: No  . Sexual Activity: Not on file   Other Topics Concern  . Not on file   Social History Narrative   Originally from [place]   Family lives up here in [place]   Has one daughter.   Enjoys spending time shopping and spending time with family        Past Surgical History  Procedure Laterality Date  . Abdominal hysterectomy  10/2006  . Toe surgery      Left 2nd metatarsal  . Cesarean section  2005  . Ep implantable device N/A 03/17/2015    Procedure: Loop Recorder Insertion;  Surgeon: Will Jorja Loa, MD;  [facility];  Service: Cardiovascular;  Laterality: N/A;  . Tee without cardioversion N/A 03/17/2015    Procedure: TRANSESOPHAGEAL ECHOCARDIOGRAM (TEE);  Surgeon: Chilton Si, MD;  Location: [facility] ENDOSCOPY;  Service: Cardiovascular;  Laterality: N/A;    Family History  Problem Relation Age of Onset  . Arthritis Father   . Diabetes Father   . Hypertension Father   . Hyperlipidemia Father   . Hyperlipidemia Mother   . Heart attack Maternal Grandfather   . Stroke Paternal Grandfather     Allergies  Allergen Reactions  . Cephalexin Rash    Current Outpatient Prescriptions on File Prior to Visit  Medication Sig Dispense Refill  . aspirin 325 MG tablet Take 1 tablet (325 mg total) by mouth daily. 60 tablet 0  . aspirin-acetaminophen-caffeine (EXCEDRIN MIGRAINE) 250-250-65 MG tablet Take 2 tablets by  mouth every 6 (six) hours as needed for headache or migraine.    Marland Kitchen atorvastatin (LIPITOR) 40 MG tablet Take 1 tablet (40 mg total) by mouth daily at 6 PM. 30 tablet 0  . castor oil liquid Take by mouth daily as needed for moderate constipation.    . hydrochlorothiazide (HYDRODIURIL) 25 MG tablet Take 1 tablet (25 mg total) by mouth daily. 90 tablet 0  . senna-docusate (SENOKOT-S) 8.6-50 MG tablet Take 1 tablet by mouth 2 (two) times daily. 30 tablet 0   No current facility-administered medications on file prior to visit.    BP 116/84 mmHg  Pulse 110  Temp(Src) 97.7 F (36.5 C) (Oral)  Ht 5\' 2"  (1.575 m)  Wt 159 lb (72.122 kg)  BMI 29.07 kg/m2  SpO2 98%    Objective:   Physical Exam  Constitutional: She appears well-nourished.  Cardiovascular: Normal rate and regular rhythm.   Pulmonary/Chest: Effort normal and breath sounds normal.  Abdominal: Soft. Bowel sounds are normal. There is generalized tenderness.  Skin: Skin is warm and dry.  Psychiatric: She has a normal mood and affect.          Assessment & Plan:

## 2015-04-03 NOTE — Assessment & Plan Note (Signed)
Improved on HCTZ 25 mg. Continue current regimen.

## 2015-04-03 NOTE — Assessment & Plan Note (Signed)
Continues. Did not attend GI appointment in May 2016 as she had a family emergency. Referral placed again. No change in symptoms except she's noticed more constipation since stroke. Continue senna-docusate and prune juice.

## 2015-04-03 NOTE — Therapy (Signed)
[facility] [address] [place], [place], [address] Phone: [phone]   Fax:  [phone]  Speech Language Pathology Treatment  Patient Details  Name: Amber [name] MRN: [id number] Date of Birth: 25-Aug-1970 Referring Provider: Dr. Kenyon Ana  Encounter Date: 04/03/2015      End of Session - 04/03/15 1643    Visit Number 2   Number of Visits 17   Date for SLP Re-Evaluation 05/27/15   SLP Start Time 1538   SLP Stop Time  1615   SLP Time Calculation (min) 37 min   Activity Tolerance Patient tolerated treatment well      Past Medical History  Diagnosis Date  . Asthma   . Depression   . Headache   . Hyperlipidemia   . Hypertension   . UTI (lower urinary tract infection)   . IBS (irritable bowel syndrome)   . Migraine   . Stroke [facility])     Past Surgical History  Procedure Laterality Date  . Abdominal hysterectomy  10/2006  . Toe surgery      Left 2nd metatarsal  . Cesarean section  2005  . Ep implantable device N/A 03/17/2015    Procedure: Loop Recorder Insertion;  Surgeon: Will Jorja Loa, MD;  Location: [facility] INVASIVE CV LAB;  Service: Cardiovascular;  Laterality: N/A;  . Tee without cardioversion N/A 03/17/2015    Procedure: TRANSESOPHAGEAL ECHOCARDIOGRAM (TEE);  Surgeon: Chilton Si, MD;  Location: [facility] ENDOSCOPY;  Service: Cardiovascular;  Laterality: N/A;    There were no vitals filed for this visit.  Visit Diagnosis: Dysfluency  Dysarthria due to recent stroke      Subjective Assessment - 04/03/15 1544    Subjective Pt arrived in ST room stuttering. 8 minutes late.               ADULT SLP TREATMENT - 04/03/15 1550    General Information   Behavior/Cognition Alert;Cooperative;Pleasant mood   Pain Assessment   Pain Assessment No/denies pain   Cognitive-Linquistic Treatment   Treatment focused on Dysarthria  dysfluency   Skilled Treatment Began with pt to attempt to have  her slow her rate by cuing her to do so, then engaging her in some simple conversation. Frequency of dysfluency did not decr, so SLP had pt produce her homework - 2-4 word sentences practicing slower speech. Pt req'd mod A usually to reduce rate, pt appeared to benefit from pacing by SLP. Self pacing produced minimal change in pt's frequency of dysfluency. SLP commented pt has fast rate of speech and pt stated she may have trouble reducing rate due to frustration when slowed speech occurs.   Assessment / Recommendations / Plan   Plan Continue with current plan of care   Progression Toward Goals   Progression toward goals Progressing toward goals          SLP Education - 04/03/15 1643    Education provided Yes   Education Details compensatory strategies, home tasks   Person(s) Educated Patient   Methods Explanation;Demonstration   Comprehension Verbalized understanding;Verbal cues required          SLP Short Term Goals - 04/03/15 1649    SLP SHORT TERM GOAL #1   Title Pt will utilize compensations for dysarthria/dysfluency during structured speech tasks.   Baseline Pt is not using compensations at phrase level    Time 4   Period Weeks   Status On-going   SLP SHORT TERM GOAL #2   Title Pt will demonstrate fluent  speech at phrase level 70% of utterances with mod A   Baseline Pt is fluent less than 20% of phrase level utterances   Time 4   Period Weeks   Status On-going          SLP Long Term Goals - 04/03/15 1650    SLP LONG TERM GOAL #1   Title Pt will ultize compensations for dysarthria and dysfluency during 10 minute simple conversation,   Baseline Pt not using compensations at phrase level   Time 8   Period Weeks   Status On-going   SLP LONG TERM GOAL #2   Title Pt will demonstrate fluent speech over 8 minute conversation with min A   Baseline Pt is dysfluent at phrase/sentence level   Time 8   Period Weeks   Status On-going          Plan - 04/03/15 1647     Clinical Impression Statement Pt presents today with mod dysfluency with dysarthria appeared to have resolved. She cont to require skilled ST to maximize fluent speech to return to workforce.   Speech Therapy Frequency 2x / week   Duration --  8 weeks, or 10 visits pending medicaid   Treatment/Interventions SLP instruction and feedback;Compensatory strategies;Internal/external aids;Environmental controls;Patient/family education;Functional tasks;Multimodal communcation approach   Potential Considerations Financial resources   Consulted and Agree with Plan of Care Patient        Problem List Patient Active Problem List   Diagnosis Date Noted  . Other fatigue 04/03/2015  . Constipation 03/31/2015  . Dysarthria   . Acute ischemic stroke (HCC) 03/14/2015  . Stroke (HCC)   . Insomnia 09/29/2014  . IBS (irritable bowel syndrome) 09/29/2014  . Hyperlipidemia 09/29/2014  . Essential hypertension 09/29/2014    [facility] , MS, CCC-SLP  04/03/2015, 4:50 PM  [facility] [address] [place], [place], [address] Phone: [phone]   Fax:  [phone]   Name: Amber [name] MRN: [id number] Date of Birth: August 11, 1970

## 2015-04-03 NOTE — Patient Instructions (Signed)
  Please complete the assigned speech therapy homework and return it to your next session.  

## 2015-04-03 NOTE — Progress Notes (Signed)
Pre visit review using our clinic review tool, if applicable. No additional management support is needed unless otherwise documented below in the visit note. 

## 2015-04-03 NOTE — Assessment & Plan Note (Signed)
Never took trazodone in past. Sent new RX today as she's had no relief with Melatonin. Follow up in 6 weeks for re-evaluation.

## 2015-04-04 ENCOUNTER — Encounter: Payer: Self-pay | Admitting: *Deleted

## 2015-04-07 ENCOUNTER — Other Ambulatory Visit: Payer: Self-pay

## 2015-04-07 ENCOUNTER — Telehealth: Payer: Self-pay | Admitting: Primary Care

## 2015-04-07 ENCOUNTER — Ambulatory Visit: Payer: Medicaid Other | Admitting: Occupational Therapy

## 2015-04-07 ENCOUNTER — Encounter: Payer: Self-pay | Admitting: Occupational Therapy

## 2015-04-07 VITALS — BP 102/75 | HR 74

## 2015-04-07 DIAGNOSIS — M6281 Muscle weakness (generalized): Secondary | ICD-10-CM

## 2015-04-07 DIAGNOSIS — I69354 Hemiplegia and hemiparesis following cerebral infarction affecting left non-dominant side: Secondary | ICD-10-CM

## 2015-04-07 DIAGNOSIS — R269 Unspecified abnormalities of gait and mobility: Secondary | ICD-10-CM | POA: Diagnosis not present

## 2015-04-07 NOTE — Therapy (Signed)
[facility] [address] [place], [place], [address] Phone: [phone]   Fax:  [phone]  Occupational Therapy Treatment  Patient Details  Name: Amber [name] MRN: [id number] Date of Birth: 1971/05/14 Referring Provider: Dr [name]  Encounter Date: 04/07/2015      OT End of Session - 04/07/15 1708    Visit Number 2   Number of Visits 2   Date for OT Re-Evaluation 04/29/15   Authorization Type Medicaid pending - pt should be approved for eval plus 8 tx. OT to use only 1 tx and allow ST to use remaining 7 visits   OT Start Time 1617   OT Stop Time 1700   OT Time Calculation (min) 43 min   Activity Tolerance Patient tolerated treatment well      Past Medical History  Diagnosis Date  . Asthma   . Depression   . Headache   . Hyperlipidemia   . Hypertension   . UTI (lower urinary tract infection)   . IBS (irritable bowel syndrome)   . Migraine   . Stroke [facility])     Past Surgical History  Procedure Laterality Date  . Abdominal hysterectomy  10/2006  . Toe surgery      Left 2nd metatarsal  . Cesarean section  2005  . Ep implantable device N/A 03/17/2015    Procedure: Loop Recorder Insertion;  Surgeon: Will Meredith Leeds, MD;  Location: [facility] CV LAB;  Service: Cardiovascular;  Laterality: N/A;  . Tee without cardioversion N/A 03/17/2015    Procedure: TRANSESOPHAGEAL ECHOCARDIOGRAM (TEE);  Surgeon: Skeet Latch, MD;  Location: [facility] ENDOSCOPY;  Service: Cardiovascular;  Laterality: N/A;    Filed Vitals:   04/07/15 1622  BP: 102/75  Pulse: 74    Visit Diagnosis:  Hemiplegia and hemiparesis following cerebral infarction affecting left non-dominant side (HCC)  Muscle weakness      Subjective Assessment - 04/07/15 1627    Subjective  My left foot feels more numb but not weaker.    Pertinent History see epic, MRI shows new L frontal stroke on10/14/2016 with old parital and tiny remote L cerebellar  strokes. HTN MONITOR BP!!!, asthma, depression   Patient Stated Goals to be able to be independent and do the things I did before   Currently in Pain? Yes   Pain Score 6    Pain Location Ankle  and foot   Pain Orientation Left   Pain Descriptors / Indicators Aching   Pain Type Acute pain   Pain Onset Yesterday   Pain Frequency Intermittent   Aggravating Factors  when I up and wallking, standing   Pain Relieving Factors rest and get off my feet.                      OT Treatments/Exercises (OP) - 04/07/15 0001    Exercises   Exercises Hand   Hand Exercises   Theraputty Flatten;Roll;Grip;Pinch  red - HEP given to pt   Theraputty - Locate Pegs Green putty with 12 pegs - pt able to complete with minimal difficulty and increased time.                 OT Education - 04/07/15 1705    Education provided Yes   Education Details Theraputty HEP to strengthen L hand   Person(s) Educated Patient   Methods Explanation;Demonstration;Verbal cues;Handout   Comprehension Verbalized understanding;Returned demonstration             OT Long  Term Goals - 04/07/15 1706    OT LONG TERM GOAL #1   Title Pt will be mod I wtih HEP to address grip strength in LUE - 04/29/2015   Baseline dependent   Status Achieved               Plan - 04/07/15 1706    Clinical Impression Statement Pt was scheduled for one OT visit to address HEP for L hand strength. Pt has met that goal today and will d/c from OT services today.    Pt will benefit from skilled therapeutic intervention in order to improve on the following deficits (Retired) Decreased activity tolerance;Decreased balance;Decreased endurance;Decreased strength;Impaired UE functional use   Rehab Potential Excellent   OT Frequency 1x / week   OT Duration Other (comment)  one week   OT Treatment/Interventions Therapeutic exercise;Therapeutic activities   Plan d/c from OT services   Recommended Other Services Pt to  continue with ST and PT. ST will have 17 visits and PT will have 6   Consulted and Agree with Plan of Care Patient        Problem List Patient Active Problem List   Diagnosis Date Noted  . Other fatigue 04/03/2015  . Constipation 03/31/2015  . Dysarthria   . Acute ischemic stroke ([place]) 03/14/2015  . Stroke ([place])   . Insomnia 09/29/2014  . IBS (irritable bowel syndrome) 09/29/2014  . Hyperlipidemia 09/29/2014  . Essential hypertension 09/29/2014  OCCUPATIONAL THERAPY DISCHARGE SUMMARY  Visits from Start of Care: evaluation plus 1 treatment  Current functional level related to goals / functional outcomes: See above status    Remaining deficits: L hand weakness and mild sensory impairment   Education / Equipment: HEP Plan: Patient agrees to discharge.  Patient goals were met. Patient is being discharged due to meeting the stated rehab goals.  ?????      Quay Burow, OTR/L 04/07/2015, 5:09 PM  [facility] [address] [place], [place], [address] Phone: [phone]   Fax:  [phone]  Name: Bahar Shelden MRN: [id number] Date of Birth: 1971/05/23

## 2015-04-07 NOTE — Telephone Encounter (Signed)
Patient Name: Amber [name]  DOB: 12/05/1970    Initial Comment Caller states she sees Dr. Chestine Spore she had a stroke; cough; raspy voice; Swallowing difficulty; needs to know if she can take and OTC with her other medications    Nurse Assessment  Nurse: Stefano Gaul, RN, Dwana Curd Date/Time (Eastern Time): 04/07/2015 12:34:45 PM  Confirm and document reason for call. If symptomatic, describe symptoms. ---Caller states she has a cold. Doing a lot of coughing. voice is raspy. Has a sore throat. No fever. Symptoms started on Saturday. She is a little better than Saturday. Coughed up a little blood streaked mucus on Saturday.  Has the patient traveled out of the country within the last 30 days? ---Not Applicable  Does the patient have any new or worsening symptoms? ---Yes  Will a triage be completed? ---Yes  Related visit to physician within the last 2 weeks? ---No  Does the PT have any chronic conditions? (i.e. diabetes, asthma, etc.) ---Yes  List chronic conditions. ---CVA; HTN  Did the patient indicate they were pregnant? ---No     Guidelines    Guideline Title Affirmed Question Affirmed Notes  Coughing Up Blood Coughing up blood (all other triage questions negative)   Sore Throat [1] Sore throat with cough/cold symptoms AND [2] present < 5 days (all triage questions negative)    Final Disposition User   Home Care Stefano Gaul, RN, Dwana Curd    Disagree/Comply: Comply    Disagree/Comply: Comply

## 2015-04-07 NOTE — Patient Instructions (Signed)
Theraputty exercises:  Using RED color putty:  Do these 1-2 times per day. STOP if you get pain and reduce the number of repetitions. Build up more slowly if you need to.   1. Make a ball     Make a pancake     Make a cone Do this sequence 5 times  2. Make a fat hot dog. Squeeze as hard as you can. Do this 10 times 3. Ring around the fingers.  Do this 10 times. 4. Make a snake:  2 pt pinch  3 pt pinch  Lateral pinch  Do these 3 times each 5. With the green theraputty you can bury small objects (lke pasta) and dig it out with your left hand.    ONCE THIS FEELS EASY WITH RED YOU CAN START TO TRANSITION TO THE GREEN FOR EXERCISES.   To transition:  Transition slowly over time.  Start using the green for a few of the exercises at first and slowly build up to doing the green for all the exercises.

## 2015-04-07 NOTE — Patient Outreach (Signed)
[facility]) Care Management  04/07/2015  Amber [name] 1970-07-05 [id number]   [facility]roke [facility]   Outreach call to patient.  Patient not reached.   RN CM left name and # and requested call back.  RN CM rescheduled for next outreach call within one week.    Donato Schultz, RN, BSN, [facility], CCM  [facility] Management Coordinator [phone] Direct [phone] Cell [phone] Office [phone] Fax

## 2015-04-07 NOTE — Telephone Encounter (Signed)
PLEASE NOTE: All timestamps contained within this report are represented as [place] Standard Time. CONFIDENTIALTY NOTICE: This fax transmission is intended only for the addressee. It contains information that is legally privileged, confidential or otherwise protected from use or disclosure. If you are not the intended recipient, you are strictly prohibited from reviewing, disclosing, copying using or disseminating any of this information or taking any action in reliance on or regarding this information. If you have received this fax in error, please notify us immediately by telephone so that we can arrange for its return to [place]. Phone: [phone], Toll-Free: [phone], Fax: [phone] Page: 1 of 2 Call Id: [id number] [facility] Day - Client TELEPHONE ADVICE RECORD [facility] Call Center Patient Name: Amber [name] Gender: Female DOB: [birth year] Age: 44 Y 6 M 16 D Return Phone Number: [phone] (Primary) Address: [address] City/State/Zip: [place] [address] Client [facility] Day - Client Client Site [facility] [place] - Day Contact Type Call Call Type Triage / Clinical Relationship To Patient Self Appointment Disposition EMR Appointment Not Necessary Info pasted into Epic Yes Return Phone Number [phone] (Primary) Chief Complaint Swallowing Difficulty Initial Comment Caller states she sees Dr. Chestine Spore she had a stroke; cough; raspy voice; Swallowing difficulty; needs to know if she can take and OTC with her other medications PreDisposition Call Doctor Nurse Assessment Nurse: Stefano Gaul, RN, Dwana Curd Date/Time Lamount Cohen Time): 04/07/2015 12:34:45 PM Confirm and document reason for call. If symptomatic, describe symptoms. ---Caller states she has a cold. Doing a lot of coughing. voice is raspy. Has a sore throat. No fever. Symptoms started on Saturday. She is a little better than Saturday. Coughed up a  little blood streaked mucus on Saturday. Has the patient traveled out of the country within the last 30 days? ---Not Applicable Does the patient have any new or worsening symptoms? ---Yes Will a triage be completed? ---Yes Related visit to physician within the last 2 weeks? ---No Does the PT have any chronic conditions? (i.e. diabetes, asthma, etc.) ---Yes List chronic conditions. ---CVA; HTN Did the patient indicate they were pregnant? ---No Guidelines Guideline Title Affirmed Question Affirmed Notes Nurse Date/Time (Eastern Time) Coughing Up Blood Coughing up blood (all other triage questions negative) Stefano Gaul, RN, Dwana Curd 04/07/2015 12:38:49 PM Sore Throat [1] Sore throat with cough/cold symptoms AND [2] present < 5 days (all triage questions negative) Stefano Gaul, RN, Dwana Curd 04/07/2015 12:42:11 PM PLEASE NOTE: All timestamps contained within this report are represented as [place] Standard Time. CONFIDENTIALTY NOTICE: This fax transmission is intended only for the addressee. It contains information that is legally privileged, confidential or otherwise protected from use or disclosure. If you are not the intended recipient, you are strictly prohibited from reviewing, disclosing, copying using or disseminating any of this information or taking any action in reliance on or regarding this information. If you have received this fax in error, please notify us immediately by telephone so that we can arrange for its return to [place]. Phone: [phone], Toll-Free: [phone], Fax: [phone] Page: 2 of 2 Call Id: [id number] Disp. Time Lamount Cohen Time) Disposition Final User 04/07/2015 12:33:04 PM Attempt made - message left Stefano Gaul, RN, Dwana Curd 04/07/2015 12:41:40 PM Home Care Yes Stefano Gaul, RN, Clerance Lav Understands: Yes Disagree/Comply: Comply Caller Understands: Yes Disagree/Comply: Comply Care Advice Given Per Guideline HOME CARE: You should be able to treat this at home. REASSURANCE: It  doesn't sound like a serious cough. Coughing up blood can happen after a forceful coughing  spell. And, it is also not uncommon during a respiratory infection to have some streaks of blood mixed in with the phlegm. In both cases, the bleeding occurs because the airways are irritated. COUGHING SPASMS: Drink warm fluids. Inhale warm mist. (Reason: both relax the airway and loosen up the phlegm) Suck on cough drops or hard candy to coat the irritated throat. CARE ADVICE given per Coughing Up Blood guideline. EXPECTED COURSE: * A single episode of coughing up blood is usually not serious. * However, if the bleeding continues or recurs you will need to be examined by a doctor. CALL BACK IF: * Blood in sputum continues or occurs again * You have a cough that lasts over 3 weeks * You become worse. * You have difficulty breathing REASSURANCE: HOME CARE: You should be able to treat this at home. * The presence of a cough, hoarseness or nasal symptoms points to a viral infection as the cause of the sore throat. * Most mild sore throats and intermittent sore throats are just part of a cold and can be treated at home. * Gargle with warm salt water four times a day. To make salt water, put 1/2 teaspoon of salt in 8 oz (240 ml) of warm water. * Suck on hard candy or a throat lozenge (OTC). SORE THROAT - For relief of sore throat: * Sip warm chicken broth or apple juice. PAIN OR FEVER MEDICINES: * For pain and fever relief, take acetaminophen or ibuprofen. DRINK PLENTY LIQUIDS: * Drink plenty of liquids. This is important to prevent dehydation. * A healthy adult should drink 8 cups (240 ml) or more of liquid each day. EXPECTED COURSE: Sore throats with viral illnesses usually last 3 or 4 days. CONTAGIOUSNESS: You can return to work after the fever is gone and you feel well enough to participate in normal activities. CALL BACK IF: * Sore throat with cold symptoms, and sore throat lasts over 5 days * You become worse  CARE ADVICE given per Sore Throat (Adult) guideline. After Care Instructions Given Call Event Type User Date / Time Description

## 2015-04-07 NOTE — Telephone Encounter (Signed)
Yes, she may take OTC medications for cough symptoms:  Sore throat, body aches, fevers, chills: Tylenol. Do not exceed 3000 mg in 24 hours. Cough: Delsym or Robitussin. Nasal congestion: Fluticasone (Flonase) nasal spray.

## 2015-04-08 NOTE — Telephone Encounter (Signed)
Patient advised.  Patient states that someone got back with her yesterday but nothing is documented that I can see.  I reiterated the instructions and told the patient that if she does not improve any over the next few days to call for an appt.

## 2015-04-09 ENCOUNTER — Ambulatory Visit: Payer: Self-pay

## 2015-04-09 ENCOUNTER — Encounter: Payer: Self-pay | Admitting: Rehabilitation

## 2015-04-09 ENCOUNTER — Telehealth: Payer: Self-pay | Admitting: Primary Care

## 2015-04-09 ENCOUNTER — Ambulatory Visit: Payer: Medicaid Other | Admitting: Rehabilitation

## 2015-04-09 DIAGNOSIS — R269 Unspecified abnormalities of gait and mobility: Secondary | ICD-10-CM | POA: Diagnosis not present

## 2015-04-09 DIAGNOSIS — I69354 Hemiplegia and hemiparesis following cerebral infarction affecting left non-dominant side: Secondary | ICD-10-CM

## 2015-04-09 DIAGNOSIS — R42 Dizziness and giddiness: Secondary | ICD-10-CM

## 2015-04-09 NOTE — Therapy (Signed)
[facility] [address] [place], [place], [address] Phone: [phone]   Fax:  [phone]  Physical Therapy Treatment  Patient Details  Name: Amber [name] MRN: [id number] Date of Birth: [birth year] Referring Provider: Micki Riley, MD  Encounter Date: 04/09/2015      PT End of Session - 04/09/15 1746    Visit Number 2   Number of Visits 7  eval + 6 visits   Date for PT Re-Evaluation 05/16/15   Authorization Type Medicaid pending   PT Start Time 1100   PT Stop Time 1145   PT Time Calculation (min) 45 min   Activity Tolerance Patient tolerated treatment well   Behavior During Therapy [facility] for tasks assessed/performed      Past Medical History  Diagnosis Date  . Asthma   . Depression   . Headache   . Hyperlipidemia   . Hypertension   . UTI (lower urinary tract infection)   . IBS (irritable bowel syndrome)   . Migraine   . Stroke [facility])     Past Surgical History  Procedure Laterality Date  . Abdominal hysterectomy  10/2006  . Toe surgery      Left 2nd metatarsal  . Cesarean section  2005  . Ep implantable device N/A 03/17/2015    Procedure: Loop Recorder Insertion;  Surgeon: Will Jorja Loa, MD;  [facility] INV[facility] LAB;  Service: Cardiovascular;  Laterality: N/A;  . Tee without cardioversion N/A 03/17/2015    Procedure: TRANSESOPHAGEAL ECHOCARDIOGRAM (TEE);  Surgeon: Chilton Si, MD;  Location: [facility] ENDOSCOPY;  Service: Cardiovascular;  Laterality: N/A;    There were no vitals filed for this visit.  Visit Diagnosis:  Dizziness and giddiness  Abnormality of gait  Hemiplegia and hemiparesis following cerebral infarction affecting left non-dominant side (HCC)      Subjective Assessment - 04/09/15 1112    Subjective "I've been having some coughing, coughing up yellow mucus."  "I have also been having some chest pain and I'm not sure if its pain from coughing or GI issues." "I have an  appt with a GI doc soon."    Pertinent History Goes by "Amber [name]". CVA (03/14/15), loop recorder placement, HTN, HLD, depression, asthma   Patient Stated Goals "To be able to tolerating walking more; and to keeping my balance without using any devices."   Currently in Pain? Yes   Pain Score 5    Pain Location Leg  ankle/foot   Pain Orientation Left   Pain Descriptors / Indicators Aching   Pain Type Acute pain   Pain Onset In the past 7 days   Pain Frequency Intermittent   Aggravating Factors  worse with walking and standing   Pain Relieving Factors rest and getting off of feet.                Self Care:  Had lengthy discussion with pt regarding issues with chest pain due to increased coughing and not having bowel movement for several days.  Pt states that MD office to follow up with her tomorrow to make GI appt to address issue.  Also note that during session, pts BP was elevated, see details in clinical impression statement, therefore went over CVA warning signs and symptoms during session and for pt to seek medical attention immediately to decrease deficits and improve healing times.  Pt verbalized understanding.  Kept check on BP throughout session but remained in 90's (systolic).    NMR:  Addressed vestibular deficits with  both education and performance of compensatory strategies as well as adaptation exercises, all added to HEP.  Performed compensatory saccades during session focusing on eye, then head then body movement.  Note no increase in dizziness with this task, therefore provided eye/head compensatory saccades to HEP.  Also addressed adaptation while standing on stacked pillows with feet together working on head movements side to side.  Had pt move slowly but moving eyes and head together.  Pt given cues to keep dizziness under 3/10 (as she started with 0/10 dizziness).  Educated on importance of slow adaptation to avoid over stimulation.  Also educated pt to perform  eyes/head/body compensations in community setting and added to HEP.                     PT Education - 04/09/15 1745    Education provided Yes   Education Details Recommendations to notify MD of sputum being coughed up, also to notify MD of BP and education on HEP.    Person(s) Educated Patient   Methods Explanation;Handout   Comprehension Verbalized understanding;Need further instruction          PT Short Term Goals - 04/01/15 1425    PT SHORT TERM GOAL #1   Title Pt will perform home exercises with mod I using paper handout to maximize functional gains made in PT. Target date: 04/22/15   PT SHORT TERM GOAL #2   Title Pt will improve FGA score from 18/30 to 20/30 to indicate improved in dynamic gait stability. Target date: 04/22/15   PT SHORT TERM GOAL #3   Title Pt will improve gait speed from 2.47 ft/sec to > 2.62 ft/sec to reach status of community ambulator. Target date: 04/22/15           PT Long Term Goals - 04/01/15 2218    PT LONG TERM GOAL #1   Title Pt will verbalize understanding of CVA warning signs, pertinent risk factors to prevent future CVA. Target date: 05/13/15   PT LONG TERM GOAL #2   Title Pt will improve FGA score from 18 to 23/30 to indicate decreased fall risk. Target date: 05/13/15   PT LONG TERM GOAL #3   Title Pt will improve gait velocity from 2.47 ft/sec to > / = 3.07 ft/sec to indicate improved efficiency of ambulation. Target date: 05/13/15   PT LONG TERM GOAL #4   Title Pt will independently ambulate x1,000' over unlevel, paved surfaces to indicate safety with community mobility, progress toward PLOF. Target date: 05/13/15               Plan - 04/09/15 1746    Clinical Impression Statement Skilled session focused on education to pt regarding notifying MD (supposed to call tomorrow) about coughing and sputum that is being produced, also about BP during PT session.  Note that upon arrival, BP was 125/104, after 2 mins was  115/91 and after vestibular exercises was 111/89.  Also educated on warning signs of CVA and to seek medical attention ASAP if notice any of these symptoms.  Remainder of session addressed vestibular compensation as well as adaptation.     Pt will benefit from skilled therapeutic intervention in order to improve on the following deficits Abnormal gait;Decreased coordination;Decreased balance;Dizziness;Postural dysfunction   Rehab Potential Good   Clinical Impairments Affecting Rehab Potential financial limitations (pt self-pay)   PT Frequency 1x / week   PT Duration 6 weeks   PT Treatment/Interventions ADLs/Self Care Home Management;Gait training;Stair training;Functional  mobility training;Therapeutic activities;Therapeutic exercise;Balance training;Neuromuscular re-education;Patient/family education;Vestibular   PT Next Visit Plan Continue to work on HEP, add exercises as neccessary, high level balance/gait; teach pt compensatory strategies for central vestibular impairments.    Consulted and Agree with Plan of Care Patient        Problem List Patient Active Problem List   Diagnosis Date Noted  . Other fatigue 04/03/2015  . Constipation 03/31/2015  . Dysarthria   . Acute ischemic stroke (HCC) 03/14/2015  . Stroke (HCC)   . Insomnia 09/29/2014  . IBS (irritable bowel syndrome) 09/29/2014  . Hyperlipidemia 09/29/2014  . Essential hypertension 09/29/2014    Harriet Butte, PT, MPT [facility] [address] [place], [place], [address] Phone: [phone]   Fax:  [phone] 04/09/2015, 5:51 PM  Name: Amber [name] MRN: [id number] Date of Birth: 01/13/1971

## 2015-04-09 NOTE — Patient Instructions (Signed)
Gaze Fixation - Compensatory Strategy 2    While walking: 1) move eyes to a stationary target, then 2) keeping eyes on target, turn head in same direction. Repeat sequence in opposite direction. Repeat __10__ times per session. Do _2___ sessions per day.  Think about this one when you are walking in store, or out in community.   Copyright  VHI. All rights reserved.   Compensatory Strategies: Corrective Saccades   Stand in corner at home for safety with chair in front of you if you need to hold on.   1. Place two stationary targets placed __4-5__ inches apart, move eyes to target, keep head still. 2. Then move head in direction of target while eyes remain on target. 3/4. Repeat in opposite direction. Perform sitting. Repeat sequence _10___ times per session. Do __10__ sessions per day.  Copyright  VHI. All rights reserved.   Feet Together (Compliant Surface) Head Motion - Eyes Open    Stand in corner with chair in front of you.  With eyes open, standing on compliant surface: __Stacked pillows ______, feet together, move head slowly: side to side to targets placed on either side of you.  Move your head very slowly (eyes too) but remember with any of these exercises, if you increase your dizziness by 3 points, stop and rest.   Repeat _10___ times per session. Do __2__ sessions per day.    Copyright  VHI. All rights reserved.

## 2015-04-09 NOTE — Telephone Encounter (Signed)
Patient Name: Amber [name]  DOB: 04/04/1971    Initial Comment Caller states she goes to speech therapy for her stroke; she is having pains in her chest   Nurse Assessment  Nurse: Annye English, RN, Angelique Blonder Date/Time (Eastern Time): 04/09/2015 1:04:07 PM  Confirm and document reason for call. If symptomatic, describe symptoms. ---Caller reports CP and her BP is 111/89. States she has been coughing alot and the CP occurs when she coughs.  Has the patient traveled out of the country within the last 30 days? ---Not Applicable  Does the patient have any new or worsening symptoms? ---Yes  Will a triage be completed? ---Yes  Related visit to physician within the last 2 weeks? ---No  Does the PT have any chronic conditions? (i.e. diabetes, asthma, etc.) ---Yes  List chronic conditions. ---CVA  Did the patient indicate they were pregnant? ---No     Guidelines    Guideline Title Affirmed Question Affirmed Notes  Chest Pain [1] Chest pain lasting <= 5 minutes AND [2] NO chest pain or cardiac symptoms now (Exceptions: pains lasting a few seconds)    Final Disposition User   See Physician within 24 Hours Carmon, RN, Angelique Blonder    Comments  Returned call to the pt to make an appt, and she states the office member just called her and made her an appt for 04/10/15 @ 11am with Vernona Rieger, NP. Advised to call back for further questions/concerns.   Referrals  REFERRED TO PCP OFFICE   Disagree/Comply: Comply

## 2015-04-10 ENCOUNTER — Encounter: Payer: Self-pay | Admitting: Primary Care

## 2015-04-10 ENCOUNTER — Encounter: Payer: Self-pay | Admitting: Internal Medicine

## 2015-04-10 ENCOUNTER — Ambulatory Visit (INDEPENDENT_AMBULATORY_CARE_PROVIDER_SITE_OTHER): Payer: Medicaid Other | Admitting: Primary Care

## 2015-04-10 VITALS — BP 128/88 | HR 88 | Temp 98.0°F | Ht 62.0 in | Wt 162.4 lb

## 2015-04-10 DIAGNOSIS — R05 Cough: Secondary | ICD-10-CM | POA: Diagnosis not present

## 2015-04-10 DIAGNOSIS — R059 Cough, unspecified: Secondary | ICD-10-CM

## 2015-04-10 MED ORDER — AZITHROMYCIN 250 MG PO TABS: ORAL_TABLET | ORAL | Status: DC

## 2015-04-10 MED ORDER — HYDROCODONE-HOMATROPINE 5-1.5 MG/5ML PO SYRP: 5.0000 mL | ORAL_SOLUTION | Freq: Every evening | ORAL | Status: DC | PRN

## 2015-04-10 NOTE — Progress Notes (Signed)
Subjective:    Patient ID: Amber [name], female    DOB: 1970/11/02, 44 y.o.   MRN: [id number]  HPI  Amber [name] is a 44 year old female who presents today with a chief complaint of cough. She also reports headache, nasal congestion, chest soreness with cough, fatigue, sore thoat. Her cough is productive with clear sputum mostly but on Saturday she noticed hemoptysis once. Her symptoms began on Saturday last weekend. She's been doing warm salt gargles, warm tea, increased her water consumption, and tylenol. All of these things have helped temporarily. Her cough is most bothersome at night. Overall her symptoms are worse as she is experience worse cough and fatigue.  Review of Systems  Constitutional: Positive for fatigue.  HENT: Positive for congestion, postnasal drip and sore throat. Negative for ear pain.   Respiratory: Positive for cough and chest tightness.   Cardiovascular: Negative for chest pain.       Past Medical History  Diagnosis Date  . Asthma   . Depression   . Headache   . Hyperlipidemia   . Hypertension   . UTI (lower urinary tract infection)   . IBS (irritable bowel syndrome)   . Migraine   . Stroke [facility])     Social History   Social History  . Marital Status: Married    Spouse Name: N/A  . Number of Children: N/A  . Years of Education: N/A   Occupational History  . Not on file.   Social History Main Topics  . Smoking status: Never Smoker   . Smokeless tobacco: Not on file  . Alcohol Use: 0.0 oz/week    0 Standard drinks or equivalent per week     Comment: rarely  . Drug Use: No  . Sexual Activity: Not on file   Other Topics Concern  . Not on file   Social History Narrative   Originally from [place]   Family lives up here in [place]   Has one daughter.   Enjoys spending time shopping and spending time with family        Past Surgical History  Procedure Laterality Date  . Abdominal hysterectomy  10/2006  . Toe surgery     Left 2nd metatarsal  . Cesarean section  2005  . Ep implantable device N/A 03/17/2015    Procedure: Loop Recorder Insertion;  Surgeon: Will Jorja Loa, MD;  [facility] INVASIVE CV LAB;  Service: Cardiovascular;  Laterality: N/A;  . Tee without cardioversion N/A 03/17/2015    Procedure: TRANSESOPHAGEAL ECHOCARDIOGRAM (TEE);  Surgeon: Chilton Si, MD;  Location: [facility] ENDOSCOPY;  Service: Cardiovascular;  Laterality: N/A;    Family History  Problem Relation Age of Onset  . Arthritis Father   . Diabetes Father   . Hypertension Father   . Hyperlipidemia Father   . Hyperlipidemia Mother   . Heart attack Maternal Grandfather   . Stroke Paternal Grandfather     Allergies  Allergen Reactions  . Cephalexin Rash    Current Outpatient Prescriptions on File Prior to Visit  Medication Sig Dispense Refill  . aspirin 325 MG tablet Take 1 tablet (325 mg total) by mouth daily. 60 tablet 0  . aspirin-acetaminophen-caffeine (EXCEDRIN MIGRAINE) 250-250-65 MG tablet Take 2 tablets by mouth every 6 (six) hours as needed for headache or migraine.    Marland Kitchen atorvastatin (LIPITOR) 40 MG tablet Take 1 tablet (40 mg total) by mouth daily at 6 PM. 30 tablet 0  . castor oil liquid Take by mouth  daily as needed for moderate constipation.    . hydrochlorothiazide (HYDRODIURIL) 25 MG tablet Take 1 tablet (25 mg total) by mouth daily. 90 tablet 0  . senna-docusate (SENOKOT-S) 8.6-50 MG tablet Take 1 tablet by mouth 2 (two) times daily. 30 tablet 0  . traZODone (DESYREL) 50 MG tablet Take 0.5-1 tablets (25-50 mg total) by mouth at bedtime as needed for sleep. 30 tablet 3   No current facility-administered medications on file prior to visit.    BP 128/88 mmHg  Pulse 88  Temp(Src) 98 F (36.7 C) (Oral)  Ht  (1.575 m)  Wt 162 lb 6.4 oz (73.664 kg)  BMI 29.70 kg/m2  SpO2 96%    Objective:   Physical Exam  Constitutional: She appears well-nourished.  HENT:  Right Ear: Tympanic membrane and ear  canal normal.  Left Ear: Tympanic membrane and ear canal normal.  Nose: Right sinus exhibits maxillary sinus tenderness. Right sinus exhibits no frontal sinus tenderness. Left sinus exhibits maxillary sinus tenderness. Left sinus exhibits no frontal sinus tenderness.  Mouth/Throat: Posterior oropharyngeal erythema present. No oropharyngeal exudate or posterior oropharyngeal edema.  Eyes: Conjunctivae are normal. Pupils are equal, round, and reactive to light.  Neck: Neck supple.  Pulmonary/Chest: Effort normal and breath sounds normal.  Lymphadenopathy:    She has no cervical adenopathy.  Skin: Skin is warm and dry.          Assessment & Plan:  URI:  Cough with fatigue, sinus pressure, sore throat, headache x 6 days, worse yesterday. Temporary relief with OTC's. Exam unremarkable mostly. Lung clear. Suspect viral illness at this point; however RX for Zpak printed for her to fill Saturday if no improvement. Supportive treatment provided including RX for Hycodan. Fluids, rest. Follow up PRN.

## 2015-04-10 NOTE — Patient Instructions (Signed)
Your symptoms are likely related to a virus, however, if you feel no better on Saturday, please fill the antibiotic as discussed.  You will take 2 tablets by mouth today, then 1 tablet daily for 4 additional days.  Daytime Cough: Delsym or Robitussin Night Cough: You may take Hycodan at bedtime as needed for cough and rest.  Sore throat: Throat lozenges, warm salt gargles, tylenol or ibuprofen.  Increase consumption of water and rest.  It was a pleasure to see you today!

## 2015-04-10 NOTE — Progress Notes (Signed)
Pre visit review using our clinic review tool, if applicable. No additional management support is needed unless otherwise documented below in the visit note. 

## 2015-04-14 ENCOUNTER — Ambulatory Visit: Payer: Self-pay

## 2015-04-15 ENCOUNTER — Ambulatory Visit: Payer: Medicaid Other | Admitting: Rehabilitation

## 2015-04-15 ENCOUNTER — Ambulatory Visit: Payer: Medicaid Other

## 2015-04-15 ENCOUNTER — Ambulatory Visit: Payer: Self-pay

## 2015-04-15 ENCOUNTER — Ambulatory Visit (INDEPENDENT_AMBULATORY_CARE_PROVIDER_SITE_OTHER): Payer: Medicaid Other | Admitting: Physician Assistant

## 2015-04-15 ENCOUNTER — Encounter: Payer: Self-pay | Admitting: Physician Assistant

## 2015-04-15 VITALS — BP 120/84 | HR 76 | Ht 62.0 in | Wt 164.5 lb

## 2015-04-15 DIAGNOSIS — Z09 Encounter for follow-up examination after completed treatment for conditions other than malignant neoplasm: Secondary | ICD-10-CM

## 2015-04-15 NOTE — Patient Instructions (Signed)
Wilburt Finlay, PA-C, recommends that you follow-up with cardiology as needed.

## 2015-04-15 NOTE — Progress Notes (Signed)
Patient ID: Amber [name], female   DOB: 1970/09/23, 44 y.o.   MRN: [id number]    Date:  04/15/2015   ID:  Amber [name], DOB 1971-02-06, MRN [id number]  PCP:  Morrie Sheldon, NP  Primary Cardiologist:  None  Chief Complaint  Patient presents with  . Hospitalization Follow-up    some dizziness and light headed  . Chest Pain    pt states she has some chest pain  . Shortness of Breath    some  . Edema    no swelling in legs     History of Present Illness: Amber [name] is a 44 y.o. female with history of hyperlipidemia, hypertension, IBS, depression and asthma. She presented in Oct 2016 with a dominant left MCA small cortical infarct with an embolic pattern secondary to unknown source.  Patient underwent TEE which revealed ejection fraction of 45-50% with diffuse global hypokinesis. There was trivial MR. No PFO or ASD. No left atrial thrombus or mass.   She underwent a Medtronic links implantation.  She is here for follow-up. She reports no problems  with the Linq implantation site. She has had some periodic chest pain which occurs when lying down or sometimes walking,  But not always when she walks. Lasts about 2 minutes. She's been feeling very tired and has been short of breath  times. She was recently put on and is still taking Zithromax  for URI.  She also had an episode of dizziness and when her blood pressure was checked it was normal.  She's has low low vitamin D levels.  The patient currently denies nausea, vomiting, fever, orthopnea, dizziness, PND, cough, congestion, abdominal pain, hematochezia, melena, lower extremity edema, claudication.  Wt Readings from Last 3 Encounters:  04/15/15 164 lb 8 oz (74.617 kg)  04/10/15 162 lb 6.4 oz (73.664 kg)  04/03/15 159 lb (72.122 kg)     Past Medical History  Diagnosis Date  . Asthma   . Depression   . Headache   . Hyperlipidemia   . Hypertension   . UTI (lower urinary tract infection)   . IBS (irritable bowel  syndrome)   . Migraine   . Stroke [facility])     Current Outpatient Prescriptions  Medication Sig Dispense Refill  . aspirin 325 MG tablet Take 1 tablet (325 mg total) by mouth daily. 60 tablet 0  . aspirin-acetaminophen-caffeine (EXCEDRIN MIGRAINE) 250-250-65 MG tablet Take 2 tablets by mouth every 6 (six) hours as needed for headache or migraine.    Marland Kitchen atorvastatin (LIPITOR) 40 MG tablet Take 1 tablet (40 mg total) by mouth daily at 6 PM. 30 tablet 0  . castor oil liquid Take by mouth daily as needed for moderate constipation.    . hydrochlorothiazide (HYDRODIURIL) 25 MG tablet Take 1 tablet (25 mg total) by mouth daily. 90 tablet 0  . HYDROcodone-homatropine (HYCODAN) 5-1.5 MG/5ML syrup Take 5 mLs by mouth at bedtime as needed. 50 mL 0  . senna-docusate (SENOKOT-S) 8.6-50 MG tablet Take 1 tablet by mouth 2 (two) times daily. 30 tablet 0  . traZODone (DESYREL) 50 MG tablet Take 0.5-1 tablets (25-50 mg total) by mouth at bedtime as needed for sleep. 30 tablet 3   No current facility-administered medications for this visit.    Allergies:    Allergies  Allergen Reactions  . Cephalexin Rash    Social History:  The patient  reports that she has never smoked. She does not have any smokeless tobacco history on file. She reports that she  drinks alcohol. She reports that she does not use illicit drugs.   Family history:   Family History  Problem Relation Age of Onset  . Arthritis Father   . Diabetes Father   . Hypertension Father   . Hyperlipidemia Father   . Hyperlipidemia Mother   . Heart attack Maternal Grandfather   . Stroke Paternal Grandfather     ROS:  Please see the history of present illness.  All other systems reviewed and negative.   PHYSICAL EXAM: VS:  BP 120/84 mmHg  Pulse 76  Ht  (1.575 m)  Wt 164 lb 8 oz (74.617 kg)  BMI 30.08 kg/m2 Well nourished, well developed, in no acute distress HEENT: Pupils are equal round react to light accommodation extraocular  movements are intact.  Neck: no JVDNo cervical lymphadenopathy. Cardiac: Regular rate and rhythm without murmurs rubs or gallops. Lungs:  clear to auscultation bilaterally, no wheezing, rhonchi or rales Ext: no lower extremity edema.  2+ radial and dorsalis pedis pulses. Skin: warm and dry.  The Linq impant site:  Nontender, no erythema, discharge or ecchymosis Neuro:    Expressive aphasia,  Otherwise appears normal  EKG:   Normal sinus rhythm rate 76 bpm  ASSESSMENT AND PLAN:  Problem List Items Addressed This Visit    None      chest pain, atypical  chest pain sounds noncardiac. No family history of early heart disease. Low normal EF on echo.  No further cardiac evaluation.   status post recent CVA. She underwent Medtronic Linqs implantation.   Follow up in device clinic.  Implant site healing well.

## 2015-04-16 ENCOUNTER — Ambulatory Visit (INDEPENDENT_AMBULATORY_CARE_PROVIDER_SITE_OTHER): Payer: Medicaid Other | Admitting: *Deleted

## 2015-04-16 ENCOUNTER — Other Ambulatory Visit: Payer: Self-pay

## 2015-04-16 ENCOUNTER — Other Ambulatory Visit: Payer: Self-pay | Admitting: Internal Medicine

## 2015-04-16 ENCOUNTER — Telehealth: Payer: Self-pay | Admitting: Primary Care

## 2015-04-16 DIAGNOSIS — I639 Cerebral infarction, unspecified: Secondary | ICD-10-CM | POA: Diagnosis not present

## 2015-04-16 MED ORDER — ATORVASTATIN CALCIUM 40 MG PO TABS: 40.0000 mg | ORAL_TABLET | Freq: Every day | ORAL | Status: DC

## 2015-04-16 NOTE — Telephone Encounter (Signed)
Refill sent to pharmacy. Needs lipid profile and CMET in 2 months.

## 2015-04-16 NOTE — Telephone Encounter (Signed)
Pt called, had a stroke on 03/13/15 and was prescribed atorvastatin (LIPITOR) 40 MG tablet [[id number].  She is requesting a refill from PCP.  Pharmacy [address]  She is requesting 90 days if possible.  Best number to call  [phone] / lt

## 2015-04-16 NOTE — Patient Outreach (Signed)
[facility]) Care Management  04/16/2015  Amber [name] November 24, 1970 [id number]  Referral Date:  03/31/15 Issue:  [facility] Stroke Program  Screening and Initial Assessment 03/31/2015   Providers: Primary MD;  Dr. Doreene Nest HH: Outpatient Rehab Program initiated 04/01/2015: PT, OT, and ST services and will continue through the end of 2016.   Social:  Lives in her home with 42yo daughter and patient's father (retired Cytogeneticist). 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  States improvement in speech and mobility.  Mobility: Ambulating with no assistive devices but gets fatigued.  Caregiver: Father  Transportation: Father DME: BP cuff Insurance: Programmer, applications and Medicaid Potential.Medicaid application completed while Inpatient at [facility] and followed by [facility] Financial Advisor   CVA (03/15/15) Home BP: improved and down in the 120's/85 and below. Weight: 164.   Medications: States new medication added:  Vit D 2,000units due to low Vit D level.  Flu Vaccine: None - has not discussed with MD this year.  Pneumonia 03/17/2015 Prior to hospital discharge.  Consent: Patient agreed to [facility] services  Plan.  RN CM confirmed patient has read Stroke Early Stages of Recovery and has no questions.  RN CM reviewed signs & symptoms of stroke.  RN CM advised in 911 should patient have any symptoms of stroke.  RN CM will continue [facility] Stroke Program follow-up within one week.  [facility] Education Mailed to patient 03/31/2015 but patient did not receive.  Education sent again on 04/16/2015. -High Blood Pressure (Hypertension): What You Can Do -Low-Salt Diet -What You Can Do To Prevent A second Stroke  Other Education: Know Before You Go: Your guide for where to go when you need medical care.   Advanced Directive: RN CM provided education on importance of having advanced directive.  RN CM  mailed copy of document.  RN CM instructed will review again next contact call to assist with completion and / or questions.   RN CM will follow-up again within the month -review education -review advanced directive document -RN CM will close case to [facility] once follow-up complete.  Patient is in-eligible for other [facility] services at this time.   RN CM advised to please notify MD of any changes in condition prior to scheduled appt's.  RN CM provided contact name and # [phone] or main office # [phone] and 24-hour nurse line # [phone].  RN CM confirmed patient is aware of 911 services for urgent emergency needs.  Donato Schultz, RN, BSN, [facility], CCM  [facility] [employer] Management Coordinator [phone] Direct [phone] Cell [phone] Office [phone] Fax

## 2015-04-16 NOTE — Telephone Encounter (Signed)
Called and notified patient of Regina's comments. Patient verbalized understanding. Patient will call back later for the labs.

## 2015-04-18 NOTE — Progress Notes (Signed)
Carelink Summary Report / Loop Recorder 

## 2015-04-21 ENCOUNTER — Ambulatory Visit: Payer: Medicaid Other | Admitting: Physical Therapy

## 2015-04-21 DIAGNOSIS — R269 Unspecified abnormalities of gait and mobility: Secondary | ICD-10-CM | POA: Diagnosis not present

## 2015-04-21 DIAGNOSIS — I69354 Hemiplegia and hemiparesis following cerebral infarction affecting left non-dominant side: Secondary | ICD-10-CM

## 2015-04-21 DIAGNOSIS — R42 Dizziness and giddiness: Secondary | ICD-10-CM

## 2015-04-21 NOTE — Therapy (Signed)
[facility] [address] [place], [place], [address] Phone: [phone]   Fax:  [phone]  Physical Therapy Treatment  Patient Details  Name: Amber [name] MRN: [id number] Date of Birth: 1970-09-26 Referring Provider: Micki Riley, MD  Encounter Date: 04/21/2015      PT End of Session - 04/21/15 1304    Visit Number 3   Number of Visits 7   Date for PT Re-Evaluation 05/16/15   Authorization Type Medicaid pending   PT Start Time 0845   PT Stop Time 0933   PT Time Calculation (min) 48 min   Activity Tolerance Patient tolerated treatment well   Behavior During Therapy [facility] for tasks assessed/performed      Past Medical History  Diagnosis Date  . Asthma   . Depression   . Headache   . Hyperlipidemia   . Hypertension   . UTI (lower urinary tract infection)   . IBS (irritable bowel syndrome)   . Migraine   . Stroke [facility])     Past Surgical History  Procedure Laterality Date  . Abdominal hysterectomy  10/2006  . Toe surgery      Left 2nd metatarsal  . Cesarean section  2005  . Ep implantable device N/A 03/17/2015    Procedure: Loop Recorder Insertion;  Surgeon: Will Jorja Loa, MD;  [facility] INVASIVE CV LAB;  Service: Cardiovascular;  Laterality: N/A;  . Tee without cardioversion N/A 03/17/2015    Procedure: TRANSESOPHAGEAL ECHOCARDIOGRAM (TEE);  Surgeon: Chilton Si, MD;  Location: [facility] ENDOSCOPY;  Service: Cardiovascular;  Laterality: N/A;    There were no vitals filed for this visit.  Visit Diagnosis:  Dizziness and giddiness  Abnormality of gait  Hemiplegia and hemiparesis following cerebral infarction affecting left non-dominant side (HCC)      Subjective Assessment - 04/21/15 0850    Subjective "I had to cancel last week because I sick. I got an antibiotic and I feel much better." Pt reports continued dizziness with turning during walking and with looking down to tie shoes.    Pertinent History Goes by "Amber [name]". CVA (03/14/15), loop recorder placement, HTN, HLD, depression, asthma   Patient Stated Goals "To be able to tolerating walking more; and to keeping my balance without using any devices."   Currently in Pain? No/denies                Vestibular Assessment - 04/21/15 0001    Symptom Behavior   Type of Dizziness Imbalance   Aggravating Factors Turning body quickly;Turning head quickly;Forward bending   Vestibulo-Occular Reflex   VOR 1 Head Only (x 1 viewing) Dizziness increased from 1/10 to 4/10 with VOR x1 horizontal head turns x5; to 3/10 with vertical head turns x5.  very slow head movement; tendency to lose target                 [facility] Adult PT Treatment/Exercise - 04/21/15 0001    Transfers   Transfers Sit to Stand;Stand to Sit   Sit to Stand 7: Independent   Stand to Sit 7: Independent   Ambulation/Gait   Ambulation/Gait Yes   Ambulation/Gait Assistance 5: Supervision;6: Modified independent (Device/Increase time)   Ambulation Distance (Feet) 525 Feet   Assistive device None   Gait Pattern Step-through pattern;Wide base of support  wide BOS at times; en bloc turning   Gait velocity --   Stairs --   Stairs Assistance --   Stair Management Technique --   Number of Stairs --  Height of Stairs --   Gait Comments During gait training, provided cueing to reiterate use of compensatory strategies (gaze fixation, turning eyes, head, then body) to compensate for central vestibular impairments. Noted effective within-session carryover of technique.   Neuro Re-ed    Neuro Re-ed Details  With use of paper handout and with 25% cueing from this PT, pt performed balance/vestibular HEP provided during previous PT session. Progressed corrective saccade exercise from turning eyes the  head to turning eyes, then head, the body. See Pt Instructions for details on all exercises, reps, sets, frequency, and duration.         Vestibular  Treatment/Exercise - 04/21/15 0001    Vestibular Treatment/Exercise   Habituation Exercises Seated Vertical Head Turns   Seated Vertical Head Turns   Number of Reps  3   Symptom Description  dizziness increased from 1/10 to 4/10 with head turns x3.  added to HEP               PT Education - 04/21/15 1141    Education provided Yes   Education Details Progressed HEP; see Pt Instructions. Strategies for decreasing dizziness/disequilibrium (emphasis on visual fixation and ambulation when dizziness increases by 3 or more from baseline).   Person(s) Educated Patient   Methods Explanation;Demonstration;Verbal cues;Handout   Comprehension Verbalized understanding;Returned demonstration          PT Short Term Goals - 04/01/15 1425    PT SHORT TERM GOAL #1   Title Pt will perform home exercises with mod I using paper handout to maximize functional gains made in PT. Target date: 04/22/15   PT SHORT TERM GOAL #2   Title Pt will improve FGA score from 18/30 to 20/30 to indicate improved in dynamic gait stability. Target date: 04/22/15   PT SHORT TERM GOAL #3   Title Pt will improve gait speed from 2.47 ft/sec to > 2.62 ft/sec to reach status of community ambulator. Target date: 04/22/15           PT Long Term Goals - 04/01/15 2218    PT LONG TERM GOAL #1   Title Pt will verbalize understanding of CVA warning signs, pertinent risk factors to prevent future CVA. Target date: 05/13/15   PT LONG TERM GOAL #2   Title Pt will improve FGA score from 18 to 23/30 to indicate decreased fall risk. Target date: 05/13/15   PT LONG TERM GOAL #3   Title Pt will improve gait velocity from 2.47 ft/sec to > / = 3.07 ft/sec to indicate improved efficiency of ambulation. Target date: 05/13/15   PT LONG TERM GOAL #4   Title Pt will independently ambulate x1,000' over unlevel, paved surfaces to indicate safety with community mobility, progress toward PLOF. Target date: 05/13/15                Plan - 04/21/15 1306    Clinical Impression Statement Session focused on decreasing disequilibrium and increasing gait stability with functional head/body turning and decreasing motion sensitivity with forward bending, looking down. Educated pt on strategies to control disequilibrium and prevent LOB during ambulation. Pt exhibited effective within-session carryover of said strategies.   Pt will benefit from skilled therapeutic intervention in order to improve on the following deficits Abnormal gait;Decreased coordination;Decreased balance;Dizziness;Postural dysfunction   Rehab Potential Good   Clinical Impairments Affecting Rehab Potential financial limitations (pt self-pay)   PT Frequency 1x / week   PT Duration 6 weeks   PT Treatment/Interventions ADLs/Self Care Home Management;Gait training;Stair  training;Functional mobility training;Therapeutic activities;Therapeutic exercise;Balance training;Neuromuscular re-education;Patient/family education;Vestibular   PT Next Visit Plan Check HEP performance and check off STG . Progress seated vertical head turns (reps), as tolerated. High level balance/gait.   Consulted and Agree with Plan of Care Patient        Problem List Patient Active Problem List   Diagnosis Date Noted  . Other fatigue 04/03/2015  . Constipation 03/31/2015  . Dysarthria   . Acute ischemic stroke (HCC) 03/14/2015  . Stroke (HCC)   . Insomnia 09/29/2014  . IBS (irritable bowel syndrome) 09/29/2014  . Hyperlipidemia 09/29/2014  . Essential hypertension 09/29/2014   Jorje Guild, PT, DPT [facility] [address] [place], [place], [address] Phone: [phone]   Fax:  [phone] 04/21/2015, 1:11 PM   Name: Ariani Seier MRN: [id number] Date of Birth: [birth year]

## 2015-04-21 NOTE — Patient Instructions (Addendum)
Compensatory Strategies: Corrective Saccades   Stick two targets ("A"s) on the wall about 2 feet apart. Stand about 3-4' away from the wall between the two A's. Stand facing wall with chair in front of you if you need to hold on.  1. Move eyes to target, keep head still. 2. Then move head in direction of target while eyes remain on target. 3. Then turn your body in the direction of the target. -  Repeat in opposite direction. Perform sitting. Repeat sequence _10___ times per session. Do __10__ sessions per day.  Gaze Fixation - Compensatory Strategy 2    While walking: 1) move eyes to a stationary target, then 2) keeping eyes on target, turn head in same direction. Repeat sequence in opposite direction. Think about this one when you are walking in store, or out in community.  Feet Together (Compliant Surface) Head Motion - Eyes Open    Stand in corner with chair in front of you. With eyes open, standing on 1 pillow head very slowly (eyes too) right to left (as though you're shaking your head "no") 10 times; then up/down (as though you're nodding your head "yes") 10 times.  * Remember with any of these exercises, if you increase your dizziness by 3 points, stop and walk until dizziness resolves.   Repeat _10___ times per session. Do __2__ sessions per day.      Seated Head Nod      In seated, move head slowly: up and down 3 times. Do __2-3__ sessions per day. If your dizziness increases by 3 or more points from baseline, stop and use the strategies we discussed in therapy until dizziness returns to baseline.

## 2015-04-22 ENCOUNTER — Telehealth: Payer: Self-pay

## 2015-04-22 NOTE — Telephone Encounter (Signed)
Please have Amber [name] try taking omeprazole (Prilosec) 20 mg daily until she see's GI. This may be purchased over the counter.

## 2015-04-22 NOTE — Telephone Encounter (Signed)
Called and notified patient of Kate's comments. Patient verbalized understanding.  

## 2015-04-22 NOTE — Telephone Encounter (Signed)
Pt left /vm; having issues with acid reflux after eats anything and pt wants to know what OTC med she can take. Pt has new pt GI appt with Dr Rhea Belton on 05/14/15.[facility] [address]

## 2015-04-28 ENCOUNTER — Telehealth: Payer: Self-pay | Admitting: Primary Care

## 2015-04-28 NOTE — Telephone Encounter (Signed)
Pt dropped off disability form to be completed. Form in [employer] in box.   Thank you  [phone]

## 2015-04-28 NOTE — Telephone Encounter (Signed)
Placed form in Kate's inbox 

## 2015-04-28 NOTE — Telephone Encounter (Signed)
Completed and placed on Robin's desk for review.

## 2015-04-29 ENCOUNTER — Ambulatory Visit: Payer: Medicaid Other | Admitting: Speech Pathology

## 2015-04-29 ENCOUNTER — Ambulatory Visit: Payer: Medicaid Other | Admitting: Physical Therapy

## 2015-04-29 DIAGNOSIS — R42 Dizziness and giddiness: Secondary | ICD-10-CM

## 2015-04-29 DIAGNOSIS — R269 Unspecified abnormalities of gait and mobility: Secondary | ICD-10-CM

## 2015-04-29 DIAGNOSIS — R4789 Other speech disturbances: Secondary | ICD-10-CM

## 2015-04-29 NOTE — Patient Instructions (Signed)
Do homework aloud, then write sentence on paper

## 2015-04-29 NOTE — Telephone Encounter (Signed)
Copy faxed Pt aware  Copy for pt Copy for file Copy for scan

## 2015-04-29 NOTE — Therapy (Signed)
[facility] [address] [place], [place], [address] Phone: [phone]   Fax:  [phone]  Speech Language Pathology Treatment  Patient Details  Name: Amber [name] MRN: [id number] Date of Birth: 1970/11/20 Referring Provider: Dr. Kenyon [name]  Encounter Date: 04/29/2015      End of Session - 04/29/15 1008    Visit Number 3   Number of Visits 17   Date for SLP Re-Evaluation 05/27/15   Authorization Type medicaid pending - Amber request visits based on PT/OT needs, likely 10 over 12 weeks   SLP Start Time 0937   SLP Stop Time  1016   SLP Time Calculation (min) 39 min   Activity Tolerance Patient tolerated treatment well      Past Medical History  Diagnosis Date  . Asthma   . Depression   . Headache   . Hyperlipidemia   . Hypertension   . UTI (lower urinary tract infection)   . IBS (irritable bowel syndrome)   . Migraine   . Stroke [facility])     Past Surgical History  Procedure Laterality Date  . Abdominal hysterectomy  10/2006  . Toe surgery      Left 2nd metatarsal  . Cesarean section  2005  . Ep implantable device N/A 03/17/2015    Procedure: Loop Recorder Insertion;  Surgeon: Amber Jorja Loa, MD;  Location: [facility] INVASIVE CV LAB;  Service: Cardiovascular;  Laterality: N/A;  . Tee without cardioversion N/A 03/17/2015    Procedure: TRANSESOPHAGEAL ECHOCARDIOGRAM (TEE);  Surgeon: Amber Si, MD;  Location: [facility] ENDOSCOPY;  Service: Cardiovascular;  Laterality: N/A;    There were no vitals filed for this visit.  Visit Diagnosis: Dysfluency      Subjective Assessment - 04/29/15 0941    Subjective "My stuttering is less when I slow down"               ADULT SLP TREATMENT - 04/29/15 0942    General Information   Behavior/Cognition Alert;Cooperative;Pleasant mood   Pain Assessment   Pain Assessment No/denies pain   Cognitive-Linquistic Treatment   Treatment focused on Dysarthria   Skilled Treatment Pt verbalized that she needs to use slow rate to facilitate fluent speech.  Structured speech tasks with slow rate with generating sentence to complete a story after reading 2-3 setences aloud. Reading comprehension intact for this tasks. Dysfluent speech 50% of utterances durning structured tasks with rare min cues for compensations. Simple conversation 70% dysfluent with occassional min cues for slow rate.   Assessment / Recommendations / Plan   Plan Continue with current plan of care   Progression Toward Goals   Progression toward goals Progressing toward goals          SLP Education - 04/29/15 1004    Education provided Yes   Education Details Compensations for neurogenic stutter   Person(s) Educated Patient   Methods Explanation;Demonstration;Verbal cues   Comprehension Verbalized understanding;Returned demonstration;Verbal cues required;Need further instruction          SLP Short Term Goals - 04/29/15 1008    SLP SHORT TERM GOAL #1   Title Pt Amber utilize compensations for dysarthria/dysfluency during structured speech tasks.   Baseline Pt is not using compensations at phrase level    Time 3   Period Weeks   Status On-going   SLP SHORT TERM GOAL #2   Title Pt Amber demonstrate fluent speech at phrase level 70% of utterances with mod A   Baseline Pt is fluent less than  20% of phrase level utterances   Time 3   Period Weeks   Status On-going          SLP Long Term Goals - 04/29/15 1008    SLP LONG TERM GOAL #1   Title Pt Amber ultize compensations for dysarthria and dysfluency during 10 minute simple conversation,   Baseline Pt not using compensations at phrase level   Time 7   Period Weeks   Status On-going   SLP LONG TERM GOAL #2   Title Pt Amber demonstrate fluent speech over 8 minute conversation with min A   Baseline Pt is dysfluent at phrase/sentence level   Time 7   Period Weeks   Status On-going          Plan - 04/29/15 1006     Clinical Impression Statement Moderate dysfluency at conversation level - improved during structured speech tasks with min verbal cues for slow rate. Continue skilled ST to maximize fluent speech for retrun to work.   Speech Therapy Frequency 2x / week   Treatment/Interventions SLP instruction and feedback;Compensatory strategies;Internal/external aids;Environmental controls;Patient/family education;Functional tasks;Multimodal communcation approach   Potential to Achieve Goals Good   Potential Considerations Financial resources   Consulted and Agree with Plan of Care Patient        Problem List Patient Active Problem List   Diagnosis Date Noted  . Other fatigue 04/03/2015  . Constipation 03/31/2015  . Dysarthria   . Acute ischemic stroke (HCC) 03/14/2015  . Stroke (HCC)   . Insomnia 09/29/2014  . IBS (irritable bowel syndrome) 09/29/2014  . Hyperlipidemia 09/29/2014  . Essential hypertension 09/29/2014    Amber [name], Amber Journey MS, CCC-SLP 04/29/2015, 10:19 AM  [facility] [address] [place], [place], [address] Phone: [phone]   Fax:  [phone]   Name: Amber [name] MRN: [id number] Date of Birth: [birth year]

## 2015-04-29 NOTE — Therapy (Signed)
[facility] [address] [place], [place], [address] Phone: [phone]   Fax:  [phone]  Physical Therapy Treatment  Patient Details  Name: Amber [name] MRN: [id number] Date of Birth: 1970-07-09 Referring Provider: Garvin Fila, MD  Encounter Date: 04/29/2015      PT End of Session - 04/29/15 1636    Visit Number 4   Number of Visits 7   Date for PT Re-Evaluation 05/16/15   Authorization Type Medicaid pending   PT Start Time 1017   PT Stop Time 1100   PT Time Calculation (min) 43 min   Activity Tolerance Patient tolerated treatment well   Behavior During Therapy [facility] for tasks assessed/performed      Past Medical History  Diagnosis Date  . Asthma   . Depression   . Headache   . Hyperlipidemia   . Hypertension   . UTI (lower urinary tract infection)   . IBS (irritable bowel syndrome)   . Migraine   . Stroke [facility])     Past Surgical History  Procedure Laterality Date  . Abdominal hysterectomy  10/2006  . Toe surgery      Left 2nd metatarsal  . Cesarean section  2005  . Ep implantable device N/A 03/17/2015    Procedure: Loop Recorder Insertion;  Surgeon: Will Meredith Leeds, MD;  Location: [place] CV LAB;  Service: Cardiovascular;  Laterality: N/A;  . Tee without cardioversion N/A 03/17/2015    Procedure: TRANSESOPHAGEAL ECHOCARDIOGRAM (TEE);  Surgeon: Skeet Latch, MD;  Location: [facility] ENDOSCOPY;  Service: Cardiovascular;  Laterality: N/A;    There were no vitals filed for this visit.  Visit Diagnosis:  Dizziness and giddiness  Abnormality of gait      Subjective Assessment - 04/29/15 1030    Subjective Pt reports Thanksgiving holiday went well. During car ride to [place], pt did need to stop periodically due to increased dizziness. Pt has been performing HEP daily, stating, "I don't like the one where I have to look down."   Pertinent History Goes by "Amber [name]". CVA (03/14/15), loop  recorder placement, HTN, HLD, depression, asthma   Patient Stated Goals "To be able to tolerating walking more; and to keeping my balance without using any devices."   Currently in Pain? No/denies                         [facility] Adult PT Treatment/Exercise - 04/29/15 0001    Ambulation/Gait   Ambulation/Gait Yes   Ambulation/Gait Assistance 6: Modified independent (Device/Increase time)   Ambulation Distance (Feet) 450 Feet   Assistive device None   Gait Pattern Step-through pattern;Wide base of support  wide BOS during turning   Ambulation Surface Level;Indoor   Gait Comments Pt with effective between-session carryover of compensatory strategy for impaired VOR during turning (turning eyes, then head, then body).   Neuro Re-ed    Neuro Re-ed Details  Using paper handout, pt performed all home exercises provided during previous sessions without cueing from this PT. Progressed vertical head turns in seated to head turnings in standing due to decreased motion sensitivity during this session. Modified corner balance exercise from narrow BOS to wide BOS and decreased reps from 10 to 5 head turns to increase pt tolerance to exercise. See Pt Instructions for details on exercises, reps, and sets.         Vestibular Treatment/Exercise - 04/29/15 0001    Vestibular Treatment/Exercise   Habituation Exercises Seated Vertical Head  Turns;Standing Vertical Head Turns   Seated Vertical Head Turns   Number of Reps  3  then 5 consecutive reps   Symptom Description  dizziness remained at 0/10   Standing Vertical Head Turns   Number of Reps  3  then 5   Symptom Description  Dizziness increased from 0/10 to 2/10               PT Education - 04/29/15 1623    Education provided Yes   Education Details HEP: progress exercise for vertical head turns; modified corner balance exercise to increase pt tolerance. See Pt Instructions for details.   Person(s) Educated Patient   Methods  Explanation;Demonstration;Handout   Comprehension Verbalized understanding;Returned demonstration          PT Short Term Goals - 04/29/15 1631    PT SHORT TERM GOAL #1   Title Pt will perform home exercises with mod I using paper handout to maximize functional gains made in PT. Target date: 04/22/15   Baseline Met 11/29.   Status Achieved   PT SHORT TERM GOAL #2   Title Pt will improve FGA score from 18/30 to 20/30 to indicate improved in dynamic gait stability. Target date: 04/22/15   Status On-going   PT SHORT TERM GOAL #3   Title Pt will improve gait speed from 2.47 ft/sec to > 2.62 ft/sec to reach status of community ambulator. Target date: 04/22/15   Status On-going           PT Long Term Goals - 04/01/15 2218    PT LONG TERM GOAL #1   Title Pt will verbalize understanding of CVA warning signs, pertinent risk factors to prevent future CVA. Target date: 05/13/15   PT LONG TERM GOAL #2   Title Pt will improve FGA score from 18 to 23/30 to indicate decreased fall risk. Target date: 05/13/15   PT LONG TERM GOAL #3   Title Pt will improve gait velocity from 2.47 ft/sec to > / = 3.07 ft/sec to indicate improved efficiency of ambulation. Target date: 05/13/15   PT LONG TERM GOAL #4   Title Pt will independently ambulate x1,000' over unlevel, paved surfaces to indicate safety with community mobility, progress toward PLOF. Target date: 05/13/15               Plan - 04/29/15 1637    Clinical Impression Statement Pt met STG 1 for HEP compliance. Pt with increased tolerance to seated vertical head turns, decreased tolerance to corner balance exercises (EO on pillow with narrow BOS, head turns). Modified HEP, as appropriate. Pt reporting that activity tolerance not as limited by dizziness. During this session, pt demonstrated effective use of compensatory strategies during mobility.   Pt will benefit from skilled therapeutic intervention in order to improve on the following  deficits Abnormal gait;Decreased coordination;Decreased balance;Dizziness;Postural dysfunction   Rehab Potential Good   Clinical Impairments Affecting Rehab Potential financial limitations (pt self-pay)   PT Frequency 1x / week   PT Duration 6 weeks   PT Treatment/Interventions ADLs/Self Care Home Management;Gait training;Stair training;Functional mobility training;Therapeutic activities;Therapeutic exercise;Balance training;Neuromuscular re-education;Patient/family education;Vestibular   PT Next Visit Plan ** Check STG's.   Consulted and Agree with Plan of Care Patient        Problem List Patient Active Problem List   Diagnosis Date Noted  . Other fatigue 04/03/2015  . Constipation 03/31/2015  . Dysarthria   . Acute ischemic stroke ([place]) 03/14/2015  . Stroke ([place])   . Insomnia 09/29/2014  .  IBS (irritable bowel syndrome) 09/29/2014  . Hyperlipidemia 09/29/2014  . Essential hypertension 09/29/2014    Billie Ruddy, PT, DPT [facility] [address] [place], [place], [address] Phone: [phone]   Fax:  [phone] 04/29/2015, 4:46 PM  Name: Amber [name] MRN: [id number] Date of Birth: 03/06/1971

## 2015-04-29 NOTE — Patient Instructions (Addendum)
Gaze Fixation - Compensatory Strategy 2    While walking: 1) move eyes to a stationary target, then 2) keeping eyes on target, turn head in same direction. Repeat sequence in opposite direction. Think about this one when you are walking in store, or out in community.  Feet Apart (Compliant Surface) Head Motion - Eyes Open    Stand in corner with chair in front of you. With eyes open, standing on 1 pillow head very slowly (eyes too) right to left (as though you're shaking your head "no") 5 times; then up/down (as though you're nodding your head "yes") 5 times.  * Remember with any of these exercises, if you increase your dizziness by 3 points, stop and walk until dizziness resolves.   Do __2__ sessions per day.      Standing Head Nod      Stand next to a stable surface and move head slowly: up and down 5 times. Do __2__ sessions per day. If your dizziness increases by 3 or more points from baseline, stop and use the strategies we discussed in therapy until dizziness returns to baseline.

## 2015-04-30 ENCOUNTER — Other Ambulatory Visit: Payer: Self-pay

## 2015-04-30 NOTE — Patient Outreach (Signed)
[facility]) Care Management  04/30/2015  Amber [name] [birth year] [id number]  Emmi Stroke Telephone Assessment   Referral Date: 03/31/15 Issue: Emmi Stroke Program  Screening and Initial Assessment 03/31/2015   Providers: Primary MD; Dr. Doreene Nest - last appt 04/10/15 HH: Outpatient Rehab Program initiated 04/01/2015: PT, OT, and ST services and will continue through the end of 2016.   Social:  Lives in her home with 11yo daughter/Jordan and patient's father (retired Cytogeneticist). 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 States improvement in speech and mobility.  Mobility: Ambulating with no assistive devices but continues to get fatigued.  Caregiver: Father  Transportation: Father DME: BP cuff Insurance: Programmer, applications and Medicaid Potential.Medicaid application completed while Inpatient at [facility] and followed by [facility] Financial Advisor   CVA (03/15/15) Home BP 120/84 Weight: 164.  Speech improving and highly motivated with rehab services.   Medications:  Pneumonia 03/17/2015 Prior to hospital discharge.  Plan.  Emmi Education reviewed with patient  -High Blood Pressure (Hypertension): What You Can Do -Low-Salt Diet -What You Can Do To Prevent A second Stroke  Other Education: Know Before You Go: Your guide for where to go when you need medical care.  RN CM reviewed signs & symptoms of stroke.  RN CM advised in 911 should patient have any symptoms of stroke.  Patient has no further questions.   Advanced Directive: RN CM provided education on importance of having advanced directive.  RN CM confirmed patient received document.  Patient states she has not completed but planning to.   Closure: RN CM closed to [facility] 04/30/2015 due to patient is in-eligible for other [facility] services at this time.   Donato Schultz, RN, BSN, [facility], CCM  [facility]  [employer] Management Coordinator [phone] Direct [phone] Cell [phone] Office [phone] Fax

## 2015-05-01 ENCOUNTER — Encounter: Payer: Self-pay | Admitting: *Deleted

## 2015-05-02 ENCOUNTER — Ambulatory Visit: Payer: Medicaid Other | Attending: Internal Medicine

## 2015-05-02 DIAGNOSIS — M6281 Muscle weakness (generalized): Secondary | ICD-10-CM | POA: Insufficient documentation

## 2015-05-02 DIAGNOSIS — M533 Sacrococcygeal disorders, not elsewhere classified: Secondary | ICD-10-CM | POA: Insufficient documentation

## 2015-05-02 DIAGNOSIS — R278 Other lack of coordination: Secondary | ICD-10-CM | POA: Insufficient documentation

## 2015-05-02 DIAGNOSIS — R293 Abnormal posture: Secondary | ICD-10-CM | POA: Diagnosis present

## 2015-05-02 DIAGNOSIS — I69322 Dysarthria following cerebral infarction: Secondary | ICD-10-CM

## 2015-05-02 DIAGNOSIS — M542 Cervicalgia: Secondary | ICD-10-CM | POA: Insufficient documentation

## 2015-05-02 DIAGNOSIS — R269 Unspecified abnormalities of gait and mobility: Secondary | ICD-10-CM | POA: Insufficient documentation

## 2015-05-02 DIAGNOSIS — I69922 Dysarthria following unspecified cerebrovascular disease: Secondary | ICD-10-CM | POA: Insufficient documentation

## 2015-05-02 DIAGNOSIS — R479 Unspecified speech disturbances: Secondary | ICD-10-CM | POA: Diagnosis present

## 2015-05-02 DIAGNOSIS — I69354 Hemiplegia and hemiparesis following cerebral infarction affecting left non-dominant side: Secondary | ICD-10-CM | POA: Diagnosis present

## 2015-05-02 DIAGNOSIS — R4789 Other speech disturbances: Secondary | ICD-10-CM

## 2015-05-02 DIAGNOSIS — R42 Dizziness and giddiness: Secondary | ICD-10-CM | POA: Diagnosis present

## 2015-05-02 NOTE — Therapy (Signed)
[facility] [address] [place], [place], [address] Phone: [phone]   Fax:  [phone]  Speech Language Pathology Treatment  Patient Details  Name: Amber [name] MRN: [id number] Date of Birth: June 20, 1970 Referring Provider: Dr. Kenyon Ana  Encounter Date: 05/02/2015      End of Session - 05/02/15 1708    Visit Number 4   Number of Visits 17   Date for SLP Re-Evaluation 05/27/15   Authorization Type medicaid pending - will request visits based on PT/OT needs, likely 10 over 12 weeks   SLP Start Time 0936   SLP Stop Time  1015   SLP Time Calculation (min) 39 min   Activity Tolerance Patient tolerated treatment well      Past Medical History  Diagnosis Date  . Asthma   . Depression   . Headache   . Hyperlipidemia   . Hypertension   . UTI (lower urinary tract infection)   . IBS (irritable bowel syndrome)   . Migraine   . Stroke [facility])     Past Surgical History  Procedure Laterality Date  . Abdominal hysterectomy  10/2006  . Toe surgery      Left 2nd metatarsal  . Cesarean section  2005  . Ep implantable device N/A 03/17/2015    Procedure: Loop Recorder Insertion;  Surgeon: Will Jorja Loa, MD;  [facility]: [facility] INVASIVE CV LAB;  Service: Cardiovascular;  Laterality: N/A;  . Tee without cardioversion N/A 03/17/2015    Procedure: TRANSESOPHAGEAL ECHOCARDIOGRAM (TEE);  Surgeon: Chilton Si, MD;  Location: [facility] ENDOSCOPY;  Service: Cardiovascular;  Laterality: N/A;    There were no vitals filed for this visit.  Visit Diagnosis: Dysfluency  Dysarthria due to recent stroke      Subjective Assessment - 05/02/15 0953    Subjective Pt arrived 6 minutes late. Completed homework as directed.               ADULT SLP TREATMENT - 05/02/15 0945    General Information   Behavior/Cognition Alert;Cooperative;Pleasant mood   Pain Assessment   Pain Assessment No/denies pain   Cognitive-Linquistic Treatment   Treatment focused on Dysarthria   Skilled Treatment Pt entered the ST room with less severe dysfluency than last visit seen by this SLP. Focus on fluency enhancing strategies was done with 4-7 word descriptions of cards (verb cards) with SLP mod A req'd consistently. Cues to reduce rate, incr flow of articlators (continual articulatory motion), light articulatory contacts, as well as continuous voicing were worked upon. Pt approx 25% successful at this level. Low frustration tolerance possible, today, hindering pt progress.   Assessment / Recommendations / Plan   Plan Continue with current plan of care   Progression Toward Goals   Progression toward goals Progressing toward goals          SLP Education - 05/02/15 1707    Education provided Yes   Education Details fluency enhancing strategies (continuous voicing, continuous articulatory movement, relaxed breath, light articulatory contacts)   Person(s) Educated Patient   Methods Explanation;Demonstration   Comprehension Need further instruction;Verbal cues required;Returned demonstration;Verbalized understanding          SLP Short Term Goals - 05/02/15 1710    SLP SHORT TERM GOAL #1   Title Pt will utilize compensations for dysarthria/dysfluency during structured speech tasks.   Baseline Pt is not using compensations at phrase level    Time 3   Period Weeks   Status On-going   SLP SHORT TERM GOAL #  2   Title Pt will demonstrate fluent speech at phrase level 70% of utterances with mod A   Baseline Pt is fluent less than 20% of phrase level utterances   Time 3   Period Weeks   Status On-going          SLP Long Term Goals - 05/02/15 1710    SLP LONG TERM GOAL #1   Title Pt will ultize compensations for dysarthria and dysfluency during 10 minute simple conversation,   Baseline Pt not using compensations at phrase level   Time 7   Period Weeks   Status On-going   SLP LONG TERM GOAL #2   Title  Pt will demonstrate fluent speech over 8 minute conversation with min A   Baseline Pt is dysfluent at phrase/sentence level   Time 7   Period Weeks   Status On-going          Plan - 05/02/15 1708    Clinical Impression Statement Moderate dysfluency at conversation level - improved slightly during structured speech tasks with mod cues for reminders and demo cues to use fluency enhancing strategies. Continue skilled ST to maximize fluent speech for retrun to work.   Speech Therapy Frequency 2x / week   Treatment/Interventions SLP instruction and feedback;Compensatory strategies;Internal/external aids;Environmental controls;Patient/family education;Functional tasks;Multimodal communcation approach   Potential to Achieve Goals Good   Potential Considerations Financial resources   Consulted and Agree with Plan of Care Patient        Problem List Patient Active Problem List   Diagnosis Date Noted  . Other fatigue 04/03/2015  . Constipation 03/31/2015  . Dysarthria   . Acute ischemic stroke (HCC) 03/14/2015  . Stroke (HCC)   . Insomnia 09/29/2014  . IBS (irritable bowel syndrome) 09/29/2014  . Hyperlipidemia 09/29/2014  . Essential hypertension 09/29/2014    [facility] , MS, CCC-SLP  05/02/2015, 5:11 PM  [facility] [address] [place], [place], [address] Phone: [phone]   Fax:  [phone]   Name: Jadelynn Scammon MRN: [id number] Date of Birth: 1970/07/15

## 2015-05-02 NOTE — Patient Instructions (Signed)
  Please complete the assigned speech therapy homework and return it to your next session.  

## 2015-05-06 ENCOUNTER — Ambulatory Visit: Payer: Medicaid Other

## 2015-05-06 ENCOUNTER — Encounter: Payer: Self-pay | Admitting: Rehabilitation

## 2015-05-06 ENCOUNTER — Other Ambulatory Visit: Payer: Self-pay | Admitting: Primary Care

## 2015-05-06 ENCOUNTER — Telehealth: Payer: Self-pay | Admitting: Internal Medicine

## 2015-05-06 ENCOUNTER — Ambulatory Visit: Payer: Medicaid Other | Admitting: Rehabilitation

## 2015-05-06 DIAGNOSIS — R479 Unspecified speech disturbances: Secondary | ICD-10-CM | POA: Diagnosis not present

## 2015-05-06 DIAGNOSIS — R42 Dizziness and giddiness: Secondary | ICD-10-CM

## 2015-05-06 DIAGNOSIS — M6281 Muscle weakness (generalized): Secondary | ICD-10-CM

## 2015-05-06 DIAGNOSIS — R4789 Other speech disturbances: Secondary | ICD-10-CM

## 2015-05-06 DIAGNOSIS — I69354 Hemiplegia and hemiparesis following cerebral infarction affecting left non-dominant side: Secondary | ICD-10-CM

## 2015-05-06 DIAGNOSIS — R269 Unspecified abnormalities of gait and mobility: Secondary | ICD-10-CM

## 2015-05-06 NOTE — Therapy (Signed)
[facility] [address] [place], [place], [address] Phone: [phone]   Fax:  [phone]  Physical Therapy Treatment  Patient Details  Name: Amber [name] MRN: [id number] Date of Birth: 1970-10-04 Referring Provider: Garvin Fila, MD  Encounter Date: 05/06/2015      PT End of Session - 05/06/15 1024    Visit Number 5   Number of Visits 7   Date for PT Re-Evaluation 05/16/15   Authorization Type Medicaid pending   PT Start Time 1018   PT Stop Time 1100   PT Time Calculation (min) 42 min   Activity Tolerance Patient tolerated treatment well   Behavior During Therapy [facility] for tasks assessed/performed      Past Medical History  Diagnosis Date  . Asthma   . Depression   . Headache   . Hyperlipidemia   . Hypertension   . UTI (lower urinary tract infection)   . IBS (irritable bowel syndrome)   . Migraine   . Stroke [facility])     Past Surgical History  Procedure Laterality Date  . Abdominal hysterectomy  10/2006  . Toe surgery      Left 2nd metatarsal  . Cesarean section  2005  . Ep implantable device N/A 03/17/2015    Procedure: Loop Recorder Insertion;  Surgeon: Will Meredith Leeds, MD;  Location: [place] CV LAB;  Service: Cardiovascular;  Laterality: N/A;  . Tee without cardioversion N/A 03/17/2015    Procedure: TRANSESOPHAGEAL ECHOCARDIOGRAM (TEE);  Surgeon: Skeet Latch, MD;  Location: [facility] ENDOSCOPY;  Service: Cardiovascular;  Laterality: N/A;    There were no vitals filed for this visit.  Visit Diagnosis:  Abnormality of gait  Dizziness and giddiness  Hemiplegia and hemiparesis following cerebral infarction affecting left non-dominant side (HCC)  Muscle weakness      Subjective Assessment - 05/06/15 1023    Subjective "I'm still not sleeping, so I'm going to call the doctor today and ask about it."    Pertinent History Goes by "[name]". CVA (03/14/15), loop recorder placement, HTN,  HLD, depression, asthma   Patient Stated Goals "To be able to tolerating walking more; and to keeping my balance without using any devices."   Currently in Pain? No/denies            [facility] PT Assessment - 05/06/15 1026    Functional Gait  Assessment   Gait assessed  Yes   Gait Level Surface Walks 20 ft in less than 5.5 sec, no assistive devices, good speed, no evidence for imbalance, normal gait pattern, deviates no more than 6 in outside of the 12 in walkway width.   Change in Gait Speed Able to smoothly change walking speed without loss of balance or gait deviation. Deviate no more than 6 in outside of the 12 in walkway width.   Gait with Horizontal Head Turns Performs head turns smoothly with slight change in gait velocity (eg, minor disruption to smooth gait path), deviates 6-10 in outside 12 in walkway width, or uses an assistive device.   Gait with Vertical Head Turns Performs task with moderate change in gait velocity, slows down, deviates 10-15 in outside 12 in walkway width but recovers, can continue to walk.   Gait and Pivot Turn Pivot turns safely within 3 sec and stops quickly with no loss of balance.  3/10 dizziness   Step Over Obstacle Is able to step over one shoe box (4.5 in total height) without changing gait speed. No evidence of imbalance.  Gait with Narrow Base of Support Is able to ambulate for 10 steps heel to toe with no staggering.   Gait with Eyes Closed Walks 20 ft, uses assistive device, slower speed, mild gait deviations, deviates 6-10 in outside 12 in walkway width. Ambulates 20 ft in less than 9 sec but greater than 7 sec.   Ambulating Backwards Walks 20 ft, uses assistive device, slower speed, mild gait deviations, deviates 6-10 in outside 12 in walkway width.   Steps Alternating feet, no rail.   Total Score 24          NMR:  Addressed STG/LTG of FGA with improvement from 18/30 to 24/30, see details above.  Also addressed vestibular deficits with  continued corner balance tasks, standing on pillows, feet apart EO with vertical head turns x 5 reps slowly with no increase in dizziness, therefore increased to 10 reps, with increase to 2/10 during session.  Progressed with compliant surface, feet apart, EC with head turns side to side x 5 reps.  Pt with increased dizziness to 5/10, therefore had pt stop and visual target to decrease dizziness.    Self Care:  Had pt verbalize current understanding of CVA warning signs and symptoms as well as risk factors for recurrent CVAs.  Pt mostly independent with recall, however did require min cues for one sided weakness and speech difficulties.  Will re-assess next visit.                     PT Education - 05/06/15 1024    Education provided Yes   Education Details Education on goals, needing to add single visit to meet POC.     Person(s) Educated Patient   Methods Explanation   Comprehension Verbalized understanding          PT Short Term Goals - 05/06/15 1025    PT SHORT TERM GOAL #1   Title Pt will perform home exercises with mod I using paper handout to maximize functional gains made in PT. Target date: 04/22/15   Baseline Met 11/29.   Status Achieved   PT SHORT TERM GOAL #2   Title Pt will improve FGA score from 18/30 to 20/30 to indicate improved in dynamic gait stability. Target date: 04/22/15   Baseline 24/30 on 05/06/15   Status Achieved   PT SHORT TERM GOAL #3   Title Pt will improve gait speed from 2.47 ft/sec to > 2.62 ft/sec to reach status of community ambulator. Target date: 04/22/15   Baseline 3.90 ft/sec    Status Achieved           PT Long Term Goals - 05/06/15 1047    PT LONG TERM GOAL #1   Title Pt will verbalize understanding of CVA warning signs, pertinent risk factors to prevent future CVA. Target date: 05/13/15   Baseline partially met 05/06/15   Status On-going   PT LONG TERM GOAL #2   Title Pt will improve FGA score from 18 to 23/30 to indicate  decreased fall risk. Target date: 05/13/15   Baseline 24/30 on 05/06/15   Status Achieved   PT LONG TERM GOAL #3   Title Pt will improve gait velocity from 2.47 ft/sec to > / = 3.07 ft/sec to indicate improved efficiency of ambulation. Target date: 05/13/15   Baseline 3.90 ft/sec on 05/06/15   Status Achieved   PT LONG TERM GOAL #4   Title Pt will independently ambulate x1,000' over unlevel, paved surfaces to indicate safety with community  mobility, progress toward PLOF. Target date: 05/13/15               Plan - 05/06/15 1024    Clinical Impression Statement Skilled session focused on addressing remainder of STG's.  Note that she met remaining 2/3 goals and also has met 2/4 LTG's.  Pt making great progress with balance and mobility, however continues to be limited by vestibular deficits, esp with vertical head movements.    Pt will benefit from skilled therapeutic intervention in order to improve on the following deficits Abnormal gait;Decreased coordination;Decreased balance;Dizziness;Postural dysfunction   Rehab Potential Good   Clinical Impairments Affecting Rehab Potential financial limitations (pt self-pay)   PT Frequency 1x / week   PT Duration 6 weeks   PT Treatment/Interventions ADLs/Self Care Home Management;Gait training;Stair training;Functional mobility training;Therapeutic activities;Therapeutic exercise;Balance training;Neuromuscular re-education;Patient/family education;Vestibular   PT Next Visit Plan Work towards LTG's.    Consulted and Agree with Plan of Care Patient        Problem List Patient Active Problem List   Diagnosis Date Noted  . Other fatigue 04/03/2015  . Constipation 03/31/2015  . Dysarthria   . Acute ischemic stroke ([place]) 03/14/2015  . Stroke ([place])   . Insomnia 09/29/2014  . IBS (irritable bowel syndrome) 09/29/2014  . Hyperlipidemia 09/29/2014  . Essential hypertension 09/29/2014    Cameron Sprang, PT, MPT [facility] [address] [place], [place], [address] Phone: [phone]   Fax:  [phone] 05/06/2015, 12:37 PM  Name: Kameria Canizares MRN: [id number] Date of Birth: 1970/11/22

## 2015-05-06 NOTE — Patient Instructions (Signed)
  Please complete the assigned speech therapy homework and return it to your next session.  

## 2015-05-06 NOTE — Therapy (Signed)
[facility] [address] [place], [place], [address] Phone: [phone]   Fax:  [phone]  Speech Language Pathology Treatment  Patient Details  Name: Amber [name] MRN: [id number] Date of Birth: 27-Dec-1970 Referring Provider: Dr. Kenyon Ana  Encounter Date: 05/06/2015      End of Session - 05/06/15 1015    Visit Number 5   Number of Visits 17   Date for SLP Re-Evaluation 05/27/15   SLP Start Time 6962   SLP Stop Time  1015   SLP Time Calculation (min) 37 min   Activity Tolerance Patient tolerated treatment well      Past Medical History  Diagnosis Date  . Asthma   . Depression   . Headache   . Hyperlipidemia   . Hypertension   . UTI (lower urinary tract infection)   . IBS (irritable bowel syndrome)   . Migraine   . Stroke [facility])     Past Surgical History  Procedure Laterality Date  . Abdominal hysterectomy  10/2006  . Toe surgery      Left 2nd metatarsal  . Cesarean section  2005  . Ep implantable device N/A 03/17/2015    Procedure: Loop Recorder Insertion;  Surgeon: Will Jorja Loa, MD;  [facility];  Service: Cardiovascular;  Laterality: N/A;  . Tee without cardioversion N/A 03/17/2015    Procedure: TRANSESOPHAGEAL ECHOCARDIOGRAM (TEE);  Surgeon: Chilton Si, MD;  Location: [facility] ENDOSCOPY;  Service: Cardiovascular;  Laterality: N/A;    There were no vitals filed for this visit.  Visit Diagnosis: Dysfluency      Subjective Assessment - 05/06/15 0940    Subjective Arrived 7 minutes late. "I didn't get to sleep until 6:15 this morning!" SLP encouraged pt to notify M.D.   Currently in Pain? No/denies               ADULT SLP TREATMENT - 05/06/15 0942    General Information   Behavior/Cognition Alert;Cooperative;Pleasant mood   Pain Assessment   Pain Assessment No/denies pain   Cognitive-Linquistic Treatment   Treatment focused on Dysarthria   Skilled  Treatment SLP needed to remind pt to reduce rate for conversational speech, occasionally. When pt incr'd rate she self-corrected occasionally. SLP utilized a timer to have pt self-rate her ability to reduce rate - average 5/10. SLP agrees with that judgment - 6 segments of self-rating. SLP educated pt re: his thought of why stuttering occurring (motor speech disorder).    Assessment / Recommendations / Plan   Plan Continue with current plan of care   Progression Toward Goals   Progression toward goals Progressing toward goals          SLP Education - 05/06/15 1014    Education provided Yes   Education Details motor speech disorders, slowed rate   Person(s) Educated Patient   Methods Explanation   Comprehension Verbalized understanding;Returned demonstration;Verbal cues required          SLP Short Term Goals - 05/06/15 1106    SLP SHORT TERM GOAL #1   Title Pt will utilize compensations for dysarthria/dysfluency during structured speech tasks.   Baseline Pt is not using compensations at phrase level    Time --   Period --   Status Achieved   SLP SHORT TERM GOAL #2   Title Pt will demonstrate fluent speech at phrase level 70% of utterances with mod A   Baseline Pt is fluent less than 20% of phrase level utterances  Time 2   Period Weeks   Status On-going          SLP Long Term Goals - 05/06/15 1106    SLP LONG TERM GOAL #1   Title Pt will ultize compensations for dysarthria and dysfluency during 10 minute simple conversation,   Baseline Pt not using compensations at phrase level   Time 6   Period Weeks   Status On-going   SLP LONG TERM GOAL #2   Title Pt will demonstrate fluent speech over 8 minute conversation with min A   Baseline Pt is dysfluent at phrase/sentence level   Time 6   Period Weeks   Status On-going          Plan - 05/06/15 1015    Clinical Impression Statement Mild -moderate dysfluency at conversation level - improved from last week. Continue  skilled ST to maximize fluent speech for retrun to work.   Speech Therapy Frequency 2x / week   Duration --  8 weeks, or 10 visits with medicaid   Treatment/Interventions SLP instruction and feedback;Compensatory strategies;Internal/external aids;Environmental controls;Patient/family education;Functional tasks;Multimodal communcation approach   Potential to Achieve Goals Good   Potential Considerations Financial resources        Problem List Patient Active Problem List   Diagnosis Date Noted  . Other fatigue 04/03/2015  . Constipation 03/31/2015  . Dysarthria   . Acute ischemic stroke (HCC) 03/14/2015  . Stroke (HCC)   . Insomnia 09/29/2014  . IBS (irritable bowel syndrome) 09/29/2014  . Hyperlipidemia 09/29/2014  . Essential hypertension 09/29/2014    [facility] , MS, CCC-SLP  05/06/2015, 11:07 AM  [facility] [address] [place], [place], [address] Phone: [phone]   Fax:  [phone]   Name: Amber [name] MRN: [id number] Date of Birth: 19-Jan-1971

## 2015-05-06 NOTE — Telephone Encounter (Signed)
Per [facility] faxed copy pt was agreeable to going to [facility] ED or [facility]. (faxed copy on [employer]).

## 2015-05-06 NOTE — Telephone Encounter (Signed)
[facility] Day - Client TELEPHONE ADVICE RECORD   [facility]    --------------------------------------------------------------------------------   Patient Name: Amber [name]  DOB: Jun 14, 1970    Initial Comment Caller states she had a stroke in October. The past few days she has been unable to sleep, even with her sleeping medication. Also has been experiencing some numbness in her right leg.       Nurse Assessment  Nurse: Tera Mater, RN, Elnita Maxwell Date/Time (Eastern Time): 05/06/2015 3:12:46 PM  Confirm and document reason for call. If symptomatic, describe symptoms. ---Caller states that she has not been sleeping well for the last 3 days because she keeps waking up with tingling, numbness and pain in her right leg. Pt had a stroke in October but was effected on her left side.    Has the patient traveled out of the country within the last 30 days? ---Not Applicable    Does the patient have any new or worsening symptoms? ---Yes    Will a triage be completed? ---Yes    Related visit to physician within the last 2 weeks? ---No    Does the PT have any chronic conditions? (i.e. diabetes, asthma, etc.) ---Yes    List chronic conditions. ---htn, high cholesterol, CVA, digestive issues    Did the patient indicate they were pregnant? ---No    Is this a behavioral health or substance abuse call? ---No           Guidelines      Guideline Title Affirmed Question Affirmed Notes       Final Disposition User         Comments  Appointment

## 2015-05-07 NOTE — Telephone Encounter (Signed)
Electronically refill request for   hydrochlorothiazide (HYDRODIURIL) 25 MG tablet   Take 1 tablet (25 mg total) by mouth daily.  Dispense: 90 tablet   Refills: 0     Last prescribed on 03/21/2015. Last seen on 04/10/2015. Follow up on 05/15/2015.

## 2015-05-08 ENCOUNTER — Ambulatory Visit: Payer: Self-pay | Admitting: Primary Care

## 2015-05-08 ENCOUNTER — Encounter: Payer: Self-pay | Admitting: Primary Care

## 2015-05-08 ENCOUNTER — Ambulatory Visit (INDEPENDENT_AMBULATORY_CARE_PROVIDER_SITE_OTHER): Payer: Medicaid Other | Admitting: Primary Care

## 2015-05-08 VITALS — BP 116/82 | HR 87 | Temp 98.2°F | Ht 62.0 in | Wt 160.4 lb

## 2015-05-08 DIAGNOSIS — G4700 Insomnia, unspecified: Secondary | ICD-10-CM | POA: Diagnosis not present

## 2015-05-08 DIAGNOSIS — M62830 Muscle spasm of back: Secondary | ICD-10-CM | POA: Diagnosis not present

## 2015-05-08 DIAGNOSIS — Z23 Encounter for immunization: Secondary | ICD-10-CM | POA: Diagnosis not present

## 2015-05-08 DIAGNOSIS — I639 Cerebral infarction, unspecified: Secondary | ICD-10-CM

## 2015-05-08 MED ORDER — CYCLOBENZAPRINE HCL 5 MG PO TABS: 5.0000 mg | ORAL_TABLET | Freq: Three times a day (TID) | ORAL | Status: DC | PRN

## 2015-05-08 NOTE — Progress Notes (Signed)
Subjective:    Patient ID: Amber [name], female    DOB: 1970-07-09, 44 y.o.   MRN: [id number]  HPI  Amber [name] is a 44 year old female who presents today with a chief complaint of low back pain. She describes her pain as "tightness" Her back pain has been intermittent for 1 week.  She has radiation of pain, that she describes as tightness/spasm with numbness to her bilateral lower extremities, but moreso to her right side, that only occurs at night in bed. Denies pain with ambulation or throughout the day. She's been involved with PT once to twice weekly since her stroke several months ago.  2) Insomnia: Initiated on Trazodone 50 mg in November for complaints of insomnia. She's mostly been taking 1/2 tablet with 1 dose of  that she took Monday  She's not noticed an improvement in her sleep on the 25 or50 mg dose.    Review of Systems  Genitourinary:       No change in bowel or bladder habits. No loss numbness to groin.  Musculoskeletal: Positive for myalgias.  Neurological: Positive for numbness.  Psychiatric/Behavioral: Positive for sleep disturbance.       Past Medical History  Diagnosis Date  . Asthma   . Depression   . Headache   . Hyperlipidemia   . Hypertension   . UTI (lower urinary tract infection)   . IBS (irritable bowel syndrome)   . Migraine   . Stroke [facility])     Social History   Social History  . Marital Status: Married    Spouse Name: N/A  . Number of Children: N/A  . Years of Education: N/A   Occupational History  . Not on file.   Social History Main Topics  . Smoking status: Never Smoker   . Smokeless tobacco: Not on file  . Alcohol Use: 0.0 oz/week    0 Standard drinks or equivalent per week     Comment: rarely  . Drug Use: No  . Sexual Activity: Not on file   Other Topics Concern  . Not on file   Social History Narrative   Originally from [place]   Family lives up here in [place]   Has one daughter.   Enjoys spending  time shopping and spending time with family        Past Surgical History  Procedure Laterality Date  . Abdominal hysterectomy  10/2006  . Toe surgery      Left 2nd metatarsal  . Cesarean section  2005  . Ep implantable device N/A 03/17/2015    Procedure: Loop Recorder Insertion;  Surgeon: Will Jorja Loa, MD;  [facility] IN[facility];  Service: Cardiovascular;  Laterality: N/A;  . Tee without cardioversion N/A 03/17/2015    Procedure: TRANSESOPHAGEAL ECHOCARDIOGRAM (TEE);  Surgeon: Chilton Si, MD;  Location: [facility] ENDOSCOPY;  Service: Cardiovascular;  Laterality: N/A;    Family History  Problem Relation Age of Onset  . Arthritis Father   . Diabetes Father   . Hypertension Father   . Hyperlipidemia Father   . Hyperlipidemia Mother   . Heart attack Maternal Grandfather   . Stroke Paternal Grandfather     Allergies  Allergen Reactions  . Cephalexin Rash    Current Outpatient Prescriptions on File Prior to Visit  Medication Sig Dispense Refill  . aspirin 325 MG tablet Take 1 tablet (325 mg total) by mouth daily. 60 tablet 0  . aspirin-acetaminophen-caffeine (EXCEDRIN MIGRAINE) 250-250-65 MG tablet Take 2 tablets  by mouth every 6 (six) hours as needed for headache or migraine.    Marland Kitchen atorvastatin (LIPITOR) 40 MG tablet Take 1 tablet (40 mg total) by mouth daily at 6 PM. 90 tablet 0  . castor oil liquid Take by mouth daily as needed for moderate constipation.    . hydrochlorothiazide (HYDRODIURIL) 25 MG tablet TAKE ONE TABLET BY MOUTH ONCE DAILY 90 tablet 0  . HYDROcodone-homatropine (HYCODAN) 5-1.5 MG/5ML syrup Take 5 mLs by mouth at bedtime as needed. 50 mL 0  . senna-docusate (SENOKOT-S) 8.6-50 MG tablet Take 1 tablet by mouth 2 (two) times daily. 30 tablet 0  . traZODone (DESYREL) 50 MG tablet Take 0.5-1 tablets (25-50 mg total) by mouth at bedtime as needed for sleep. 30 tablet 3   No current facility-administered medications on file prior to visit.    BP 116/82  mmHg  Pulse 87  Temp(Src) 98.2 F (36.8 C) (Oral)  Ht 5\' 2"  (1.575 m)  Wt 160 lb 6.4 oz (72.757 kg)  BMI 29.33 kg/m2  SpO2 98%    Objective:   Physical Exam  Constitutional: She is oriented to person, place, and time. She appears well-nourished.  Eyes: EOM are normal.  Cardiovascular: Normal rate and regular rhythm.   Pulmonary/Chest: Effort normal and breath sounds normal.  Musculoskeletal:       Lumbar back: She exhibits tenderness and spasm. She exhibits normal range of motion.  Negative straight leg raise bilaterally.   Neurological: She is alert and oriented to person, place, and time. She has normal reflexes. No cranial nerve deficit. Coordination normal.  No facial drooping, loss of sensation, arm drift, slurred speech.          Assessment & Plan:  Back pain:  Located to mid right side of back mostly, describes as "tightness", intermittent x 1 week. No recent injury. Tender upon exam, no decrease in ROM. Neuro exam unremarkable. Good strength bilaterally.  Suspect muscle spasm as she is worked twice weekly in PT. Treat with low dose Flexeril and discussed to start walking on the treadmill at the [facility]. Return precautions provided.

## 2015-05-08 NOTE — Progress Notes (Signed)
Pre visit review using our clinic review tool, if applicable. No additional management support is needed unless otherwise documented below in the visit note. 

## 2015-05-08 NOTE — Patient Instructions (Addendum)
Start Cyclobenzaprine muscle relaxant. Start by taking 1 tablet by mouth at bedtime. If no improvement, you may take a second tablet.   Increase your Trazodone to 100 mg which is 2 of the 50 mg tablets. Please keep me updated on the progress of your sleep.  Please notify me if the pain dose not resolve and if you develop numbness throughout the day.  It was a pleasure to see you today!  Schedule a lab only appointment in mid January.  Follow up in 4 months.

## 2015-05-08 NOTE — Assessment & Plan Note (Signed)
No improvement in sleep with trazodone 50 mg. Increase dose to 100 mg now. She is to update me in 1 week. If improved, will send in 100 mg dose to her pharmacy.

## 2015-05-08 NOTE — Assessment & Plan Note (Signed)
Speech largely improved overall. Good strength today. Neuro exam unremarkable

## 2015-05-09 ENCOUNTER — Ambulatory Visit: Payer: Medicaid Other

## 2015-05-09 DIAGNOSIS — R479 Unspecified speech disturbances: Secondary | ICD-10-CM | POA: Diagnosis not present

## 2015-05-09 DIAGNOSIS — I69322 Dysarthria following cerebral infarction: Secondary | ICD-10-CM

## 2015-05-09 DIAGNOSIS — R4789 Other speech disturbances: Secondary | ICD-10-CM

## 2015-05-09 NOTE — Therapy (Signed)
[facility] [address] [place], [place], [address] Phone: [phone]   Fax:  [phone]  Speech Language Pathology Treatment  Patient Details  Name: Amber [name] MRN: [id number] Date of Birth: 1971-02-07 Referring Provider: Dr. Kenyon Ana  Encounter Date: 05/09/2015      End of Session - 05/09/15 1018    Visit Number 6   Number of Visits 17   Date for SLP Re-Evaluation 05/27/15   SLP Start Time 0934   SLP Stop Time  1016   SLP Time Calculation (min) 42 min   Activity Tolerance Patient tolerated treatment well      Past Medical History  Diagnosis Date  . Asthma   . Depression   . Headache   . Hyperlipidemia   . Hypertension   . UTI (lower urinary tract infection)   . IBS (irritable bowel syndrome)   . Migraine   . Stroke [facility])     Past Surgical History  Procedure Laterality Date  . Abdominal hysterectomy  10/2006  . Toe surgery      Left 2nd metatarsal  . Cesarean section  2005  . Ep implantable device N/A 03/17/2015    Procedure: Loop Recorder Insertion;  Surgeon: Will Jorja Loa, MD;  [facility] IN[facility] CV LAB;  Service: Cardiovascular;  Laterality: N/A;  . Tee without cardioversion N/A 03/17/2015    Procedure: TRANSESOPHAGEAL ECHOCARDIOGRAM (TEE);  Surgeon: Chilton Si, MD;  Location: [facility] ENDOSCOPY;  Service: Cardiovascular;  Laterality: N/A;    There were no vitals filed for this visit.  Visit Diagnosis: Dysfluency  Dysarthria due to recent stroke      Subjective Assessment - 05/09/15 0941    Currently in Pain? No/denies               ADULT SLP TREATMENT - 05/09/15 0958    General Information   Behavior/Cognition Alert;Cooperative;Pleasant mood   Cognitive-Linquistic Treatment   Treatment focused on Dysarthria  dysluency   Skilled Treatment Pt entered room with min dysflency. With extended conversation SLP rated pt's ability to maintain fluency enhancing  strategies approx 50%-60%. Pt self-rated 40-50%. SLP introduced the idea of reducing to x1/week and pt agreed she felt that was prudent at this time.   Assessment / Recommendations / Plan   Plan --  reduce to x1/week due to progress   Progression Toward Goals   Progression toward goals Progressing toward goals            SLP Short Term Goals - 05/09/15 1037    SLP SHORT TERM GOAL #1   Title Pt will utilize compensations for dysarthria/dysfluency during structured speech tasks.   Baseline Pt is not using compensations at phrase level    Status Achieved   SLP SHORT TERM GOAL #2   Title Pt will demonstrate fluent speech at phrase level 70% of utterances with mod A   Baseline Pt is fluent less than 20% of phrase level utterances   Status Achieved          SLP Long Term Goals - 05/09/15 1037    SLP LONG TERM GOAL #1   Title Pt will ultize compensations for dysarthria and dysfluency at least 75% of the time during 15 minute simple to mod complex conversation over two sessions   Baseline Pt not using compensations at phrase level   Time 6   Period Weeks   Status Revised   SLP LONG TERM GOAL #2   Title Pt will demonstrate fluent speech  over 8 minute conversation with min A   Baseline Pt is dysfluent at phrase/sentence level   Time 6   Period Weeks   Status On-going          Plan - 05/09/15 1036    Clinical Impression Statement Mild dysfluency at conversation level - improved slightly from last visit. Continue skilled ST at once per week due to progress, to maximize fluent speech for return to work.   Speech Therapy Frequency 2x / week   Duration --  8 weeks, or 10 visits with medicaid   Treatment/Interventions SLP instruction and feedback;Compensatory strategies;Internal/external aids;Environmental controls;Patient/family education;Functional tasks;Multimodal communcation approach   Potential to Achieve Goals Good   Potential Considerations Financial resources         Problem List Patient Active Problem List   Diagnosis Date Noted  . Other fatigue 04/03/2015  . Constipation 03/31/2015  . Dysarthria   . Acute ischemic stroke (HCC) 03/14/2015  . Stroke (HCC)   . Insomnia 09/29/2014  . IBS (irritable bowel syndrome) 09/29/2014  . Hyperlipidemia 09/29/2014  . Essential hypertension 09/29/2014    [facility] , MS, CCC-SLP  05/09/2015, 10:41 AM  [facility] [address] [place], [place], [address] Phone: [phone]   Fax:  [phone]   Name: Amber [name] MRN: [id number] Date of Birth: 08-Oct-1970

## 2015-05-13 ENCOUNTER — Ambulatory Visit: Payer: Medicaid Other | Admitting: Physical Therapy

## 2015-05-13 ENCOUNTER — Ambulatory Visit: Payer: Medicaid Other

## 2015-05-13 DIAGNOSIS — I69322 Dysarthria following cerebral infarction: Secondary | ICD-10-CM

## 2015-05-13 DIAGNOSIS — R4789 Other speech disturbances: Secondary | ICD-10-CM

## 2015-05-13 DIAGNOSIS — M533 Sacrococcygeal disorders, not elsewhere classified: Secondary | ICD-10-CM

## 2015-05-13 DIAGNOSIS — R479 Unspecified speech disturbances: Secondary | ICD-10-CM | POA: Diagnosis not present

## 2015-05-13 DIAGNOSIS — M542 Cervicalgia: Secondary | ICD-10-CM

## 2015-05-13 DIAGNOSIS — R278 Other lack of coordination: Secondary | ICD-10-CM

## 2015-05-13 DIAGNOSIS — R293 Abnormal posture: Secondary | ICD-10-CM

## 2015-05-13 NOTE — Therapy (Signed)
[facility] [address] [place], [place], [address] Phone: [phone]   Fax:  [phone]  Speech Language Pathology Treatment  Patient Details  Name: Amber [name] MRN: [id number] Date of Birth: 1971-02-14 Referring Provider: Dr. Kenyon Ana  Encounter Date: 05/13/2015      End of Session - 05/13/15 1018    Visit Number 7   Number of Visits 17   Date for SLP Re-Evaluation 05/27/15   SLP Start Time 0934   SLP Stop Time  1015   SLP Time Calculation (min) 41 min   Activity Tolerance Patient tolerated treatment well      Past Medical History  Diagnosis Date  . Asthma   . Depression   . Headache   . Hyperlipidemia   . Hypertension   . UTI (lower urinary tract infection)   . IBS (irritable bowel syndrome)   . Migraine   . Stroke [facility])     Past Surgical History  Procedure Laterality Date  . Abdominal hysterectomy  10/2006  . Toe surgery      Left 2nd metatarsal  . Cesarean section  2005  . Ep implantable device N/A 03/17/2015    Procedure: Loop Recorder Insertion;  Surgeon: Will Jorja Loa, MD;  [facility] CV LAB;  Service: Cardiovascular;  Laterality: N/A;  . Tee without cardioversion N/A 03/17/2015    Procedure: TRANSESOPHAGEAL ECHOCARDIOGRAM (TEE);  Surgeon: Chilton Si, MD;  Location: [facility] ENDOSCOPY;  Service: Cardiovascular;  Laterality: N/A;    There were no vitals filed for this visit.  Visit Diagnosis: Dysfluency  Dysarthria due to recent stroke      Subjective Assessment - 05/13/15 0947    Subjective Pt reports her mother went to hospital this weekend for allergic reaction to Ibuprofen. IT has been on her mind.               ADULT SLP TREATMENT - 05/13/15 0947    General Information   Behavior/Cognition Alert;Cooperative;Pleasant mood   Pain Assessment   Pain Assessment 0-10   Pain Score 3    Pain Location neck   Pain Descriptors / Indicators Sore   Pain Intervention(s) Monitored during session   Cognitive-Linquistic Treatment   Treatment focused on Dysarthria  fluency   Skilled Treatment Pt and SLP engaged in conversation throughout session with pt self-evaluating ability to use compensations average 6-7/10 (10=compensations 100%). Outside therapy room pt with only mild dysfluency, almost WNL.    Assessment / Recommendations / Plan   Plan Continue with current plan of care   Progression Toward Goals   Progression toward goals Progressing toward goals            SLP Short Term Goals - 05/13/15 1028    SLP SHORT TERM GOAL #1   Title Pt will utilize compensations for dysarthria/dysfluency during structured speech tasks.   Baseline Pt is not using compensations at phrase level    Status Achieved   SLP SHORT TERM GOAL #2   Title Pt will demonstrate fluent speech at phrase level 70% of utterances with mod A   Baseline Pt is fluent less than 20% of phrase level utterances   Status Achieved          SLP Long Term Goals - 05/13/15 1028    SLP LONG TERM GOAL #1   Title Pt will ultize compensations for dysarthria and dysfluency at least 75% of the time during 15 minute simple to mod complex conversation over two sessions  Baseline Pt not using compensations at phrase level   Time 6   Period Weeks   Status Achieved   SLP LONG TERM GOAL #2   Title Pt will demonstrate fluent speech over 8 minute conversation with min A over three sessions   Baseline Pt is dysfluent at phrase/sentence level   Time 6   Period Weeks   Status Revised          Plan - 05/13/15 1019    Clinical Impression Statement Pt continues to make progress with dysfluency and demonstrating compensations. SLP believes pt could return to work with her speech as it is currently. Continue skilled ST at once per week, to maximize fluent speech for return to work.   Speech Therapy Frequency 1x /week   Duration --  8 weeks, or 10 visits with medicaid    Treatment/Interventions SLP instruction and feedback;Compensatory strategies;Internal/external aids;Environmental controls;Patient/family education;Functional tasks;Multimodal communcation approach   Potential to Achieve Goals Good   Potential Considerations Financial resources   Consulted and Agree with Plan of Care Patient        Problem List Patient Active Problem List   Diagnosis Date Noted  . Other fatigue 04/03/2015  . Constipation 03/31/2015  . Dysarthria   . Acute ischemic stroke (HCC) 03/14/2015  . Stroke (HCC)   . Insomnia 09/29/2014  . IBS (irritable bowel syndrome) 09/29/2014  . Hyperlipidemia 09/29/2014  . Essential hypertension 09/29/2014    [facility] , MS, CCC-SLP   05/13/2015, 10:29 AM  [facility] [address] [place], [place], [address] Phone: [phone]   Fax:  [phone]   Name: Amber [name] MRN: [id number] Date of Birth: 1971/04/20

## 2015-05-13 NOTE — Patient Instructions (Signed)
  CERVICAL CHIN TUCK  AND RETRACTION - SUPINE WITH TOWEL  While lying on your back with a small folded up towel under your head, tuck your chin towards your chest. Also, focus on putting pressure on the towel with the back of your head. Hold this stretch for 10 seconds. Relax. Repeat. Perform 6 reps, 10 times per day.  Pelvic Tilt: Posterior - Legs Bent (Supine)    Tighten stomach and flatten lower back against bed.  Hold __5__ seconds. Relax. Repeat __10__ times per set. Do __2__ sets per day. When you feel comfortable, raise right leg then left leg while maintaining pelvic tilt.

## 2015-05-13 NOTE — Therapy (Signed)
[address] [place], [place], [address] Phone: [phone]   Fax:  [phone]  Physical Therapy Treatment  Patient Details  Name: Amber [name] MRN: [id number] Date of Birth: May 12, 1971 Referring Provider: Garvin Fila, MD  Encounter Date: 05/13/2015      PT End of Session - 05/13/15 1726    Visit Number 6   Number of Visits 7   Date for PT Re-Evaluation 05/30/15   Authorization Type Medicaid pending   PT Start Time 1020   PT Stop Time 1104   PT Time Calculation (min) 44 min   Activity Tolerance Patient limited by pain   Behavior During Therapy [facility] for tasks assessed/performed      Past Medical History  Diagnosis Date  . Asthma   . Depression   . Headache   . Hyperlipidemia   . Hypertension   . UTI (lower urinary tract infection)   . IBS (irritable bowel syndrome)   . Migraine   . Stroke [facility])     Past Surgical History  Procedure Laterality Date  . Abdominal hysterectomy  10/2006  . Toe surgery      Left 2nd metatarsal  . Cesarean section  2005  . Ep implantable device N/A 03/17/2015    Procedure: Loop Recorder Insertion;  Surgeon: Will Meredith Leeds, MD;  Location: [place] CV LAB;  Service: Cardiovascular;  Laterality: N/A;  . Tee without cardioversion N/A 03/17/2015    Procedure: TRANSESOPHAGEAL ECHOCARDIOGRAM (TEE);  Surgeon: Skeet Latch, MD;  Location: [facility] ENDOSCOPY;  Service: Cardiovascular;  Laterality: N/A;    There were no vitals filed for this visit.  Visit Diagnosis:  Sacroiliac pain - Plan: PT plan of care cert/re-cert  Coordination impairment - Plan: PT plan of care cert/re-cert  Abnormal posture - Plan: PT plan of care cert/re-cert  Neck pain - Plan: PT plan of care cert/re-cert      Subjective Assessment - 05/13/15 1025    Subjective Pt reports having recently started flexeril due to recent onset of muscle spasms in back. Pt reports that tolerance to  activity is secondary to pain in lower back and cervical spine.   Pertinent History Goes by "[name]". CVA (03/14/15), loop recorder placement, HTN, HLD, depression, asthma   Patient Stated Goals "To be able to tolerating walking more; and to keeping my balance without using any devices."   Currently in Pain? Yes   Pain Score 3    Pain Location Neck   Pain Orientation Mid;Lower   Pain Descriptors / Indicators Aching   Pain Type Acute pain   Pain Onset Today   Pain Frequency Constant   Aggravating Factors  after sleeping a certain way   Pain Relieving Factors heat   Multiple Pain Sites Yes   Pain Score 3   Pain Location Back   Pain Orientation Right;Lower   Pain Descriptors / Indicators Sharp   Pain Type Acute pain   Pain Radiating Towards posterior aspect of R leg   Pain Onset 1 to 4 weeks ago   Pain Frequency Intermittent   Aggravating Factors  standing, walking, and returning to standing from bending over   Pain Relieving Factors rest; limiting standing/walking   Effect of Pain on Daily Activities limits standing/walking tolerance            [facility] PT Assessment - 05/13/15 0001    Observation/Other Assessments   Observations Increased pain in R PSIS when returning to upright from (standing) thoracolumbar flexion.  Special Tests    Special Tests Sacrolliac Tests;Leg LengthTest   Sacroiliac Tests  Pelvic Compression   Leg length test  other  Supine to long sit test suggests R anterior inominate   Pelvic Dictraction   Findings Positive   Side  Right   Pelvic Compression   Findings Positive   Side Right   Sacral thrust    Findings Positive   Side Right                     [facility] Adult PT Treatment/Exercise - 05/13/15 0001    Bed Mobility   Bed Mobility Supine to Sit   Supine to Sit 5: Supervision   Supine to Sit Details (indicate cue type and reason) Educated pt on logroll technique, core muscular activation to decrease lower back pain during supine >  sit.    Exercises   Exercises Other Exercises   Other Exercises  Attempted MET for R anterior innominate 4 x10-second holds; however, pt reporting no significant change in symptoms. Transitioned to supine posterior pelvic tilt (transverse abdominus activation most effective with posterior pelvic tilt, as compared with abdominal bracing) 3-sec holds x10 reps; 5-sec holds x5 reps; then posterior pelvic tilt with concurrent R then LLE elevation 2 reps consecutively x8 trials.                PT Education - 05/13/15 1311    Education provided Yes   Education Details Impact of core muscle activation/strengthening on pain in SI joint. HEP for postural impairments, core muscular activation. Logroll technique to decrease pain with supine > sit.    Person(s) Educated Patient   Methods Explanation;Demonstration;Tactile cues;Verbal cues;Handout   Comprehension Verbalized understanding;Returned demonstration          PT Short Term Goals - 05/06/15 1025    PT SHORT TERM GOAL #1   Title Pt will perform home exercises with mod I using paper handout to maximize functional gains made in PT. Target date: 04/22/15   Baseline Met 11/29.   Status Achieved   PT SHORT TERM GOAL #2   Title Pt will improve FGA score from 18/30 to 20/30 to indicate improved in dynamic gait stability. Target date: 04/22/15   Baseline 24/30 on 05/06/15   Status Achieved   PT SHORT TERM GOAL #3   Title Pt will improve gait speed from 2.47 ft/sec to > 2.62 ft/sec to reach status of community ambulator. Target date: 04/22/15   Baseline 3.90 ft/sec    Status Achieved           PT Long Term Goals - 05/13/15 1728    PT LONG TERM GOAL #1   Title Pt will verbalize understanding of CVA warning signs, pertinent risk factors to prevent future CVA. Modified target date: 05/30/15   Baseline partially met 05/06/15   Status On-going   PT LONG TERM GOAL #2   Title Pt will improve FGA score from 18 to 23/30 to indicate decreased  fall risk. Target date: 05/13/15   Baseline 24/30 on 05/06/15   Status Achieved   PT LONG TERM GOAL #3   Title Pt will improve gait velocity from 2.47 ft/sec to > / = 3.07 ft/sec to indicate improved efficiency of ambulation. Target date: 05/13/15   Baseline 3.90 ft/sec on 05/06/15   Status Achieved   PT LONG TERM GOAL #4   Title Pt will independently ambulate x1,000' over unlevel, paved surfaces to indicate safety with community mobility, progress toward PLOF. Modified   target date: 05/30/15   Baseline 12/13: Walking tolerance currently limited by pain in R sacroiliac joint.   Status On-going   PT LONG TERM GOAL #5   Title :               Plan - 05/13/15 1839    Clinical Impression Statement Session focused on addressing pain in right PSIS which is currently limiting patient's ambulation tolerance. Based on clinical findings (clinical prediction guideline) unable to rule out R SI joint as origin of pain. Concordant pain diminished with activation of transverse abdominus muscle. Initiated lumbar stabilization program to address pain. Extended POC to enable pt to complete final PT visit. Pt in agreement with this.    Pt will benefit from skilled therapeutic intervention in order to improve on the following deficits Abnormal gait;Decreased coordination;Decreased balance;Dizziness;Postural dysfunction;Pain;Decreased activity tolerance;Hypermobility   Rehab Potential Good   Clinical Impairments Affecting Rehab Potential financial limitations (pt self-pay)   PT Frequency 1x / week   PT Duration 6 weeks   PT Treatment/Interventions ADLs/Self Care Home Management;Gait training;Stair training;Functional mobility training;Therapeutic activities;Therapeutic exercise;Balance training;Neuromuscular re-education;Patient/family education;Vestibular;Manual techniques   PT Next Visit Plan Assess pain in lower back. Ask about lumbar stabilization home exercises. Assess remaining 2 LTG's and discharge  (Blair will write DC).    Consulted and Agree with Plan of Care Patient        Problem List Patient Active Problem List   Diagnosis Date Noted  . Other fatigue 04/03/2015  . Constipation 03/31/2015  . Dysarthria   . Acute ischemic stroke (HCC) 03/14/2015  . Stroke (HCC)   . Insomnia 09/29/2014  . IBS (irritable bowel syndrome) 09/29/2014  . Hyperlipidemia 09/29/2014  . Essential hypertension 09/29/2014    Blair Hobble, PT, DPT [facility] [address] [place], [place], [address] Phone: [phone]   Fax:  [phone] 05/13/2015, 7:03 PM   Name: Ziyon Mira MRN: [id number] Date of Birth: 08/21/1970     

## 2015-05-14 ENCOUNTER — Telehealth: Payer: Self-pay | Admitting: Physician Assistant

## 2015-05-14 ENCOUNTER — Ambulatory Visit (INDEPENDENT_AMBULATORY_CARE_PROVIDER_SITE_OTHER): Payer: Medicaid Other | Admitting: Internal Medicine

## 2015-05-14 ENCOUNTER — Encounter: Payer: Self-pay | Admitting: Internal Medicine

## 2015-05-14 VITALS — BP 118/84 | HR 80 | Ht 62.0 in | Wt 161.1 lb

## 2015-05-14 DIAGNOSIS — K5900 Constipation, unspecified: Secondary | ICD-10-CM | POA: Diagnosis not present

## 2015-05-14 DIAGNOSIS — K219 Gastro-esophageal reflux disease without esophagitis: Secondary | ICD-10-CM

## 2015-05-14 DIAGNOSIS — Z86010 Personal history of colonic polyps: Secondary | ICD-10-CM | POA: Diagnosis not present

## 2015-05-14 DIAGNOSIS — K5909 Other constipation: Secondary | ICD-10-CM

## 2015-05-14 DIAGNOSIS — Z8673 Personal history of transient ischemic attack (TIA), and cerebral infarction without residual deficits: Secondary | ICD-10-CM

## 2015-05-14 MED ORDER — RANITIDINE HCL 150 MG PO TABS: 150.0000 mg | ORAL_TABLET | Freq: Every day | ORAL | Status: DC

## 2015-05-14 MED ORDER — LINACLOTIDE 145 MCG PO CAPS: 145.0000 ug | ORAL_CAPSULE | Freq: Every day | ORAL | Status: DC

## 2015-05-14 NOTE — Patient Instructions (Signed)
We have sent the following medications to your pharmacy for you to pick up at your convenience: Ranitidine 150 mg every night Linzess 145 mg daily  Please follow up with Dr Rhea Belton in 3 months.  We will request records from Dr Senaida Ores (phone [phone], fax [phone])

## 2015-05-14 NOTE — Telephone Encounter (Signed)
Informed pt that her appt for Friday will be done automatically w/ her home monitor. Pt verbalized understanding.

## 2015-05-14 NOTE — Progress Notes (Addendum)
Patient ID: Amber [name], female   DOB: 30-Jul-1970, 44 y.o.   MRN: [id number] HPI: Amber [name] is a 44 year old female with history of ischemic stroke in October with resultant dysphasia, hypertension, hyperlipidemia who is seen in consultation at the request of Burnell Blanks, NP to evaluate constipation and GERD. She is here alone today. She reports a long-standing history of acid reflux. This bothers her almost exclusively at night. She feels heartburn as well as water brash. No dysphagia or odynophagia. Appetite is been normal. She does have issues with constipation and this is somewhat long-standing. She's having a bowel movement one day per week or less. After bowel movement she feels better but slowly constipation bills and she feels bloated and in general uncomfortable. Seemed to start around 2006 when she was diagnosed with endometriosis. She is status post full hysterectomy. She has been using castor well on advice of her dad to have bowel movements. She denies blood in her stool or melena. No weight loss.  Prior to her stroke she was taking no medicines she's currently taking aspirin, Lipitor, hydrochlorothiazide and trazodone as needed for sleep  She previously was seen by Dr[facility]. She recalls colonoscopy in 2008 approximately. She recalls benign polyps being removed. Unclear histology  Past Medical History  Diagnosis Date  . Asthma   . Depression   . Headache   . Hyperlipidemia   . Hypertension   . UTI (lower urinary tract infection)   . IBS (irritable bowel syndrome)   . Migraine   . Stroke [facility]) 03-13-2015    Past Surgical History  Procedure Laterality Date  . Abdominal hysterectomy  10/2006  . Toe surgery      Left 2nd metatarsal  . Cesarean section  2005  . Ep implantable device N/A 03/17/2015    Procedure: Loop Recorder Insertion;  Surgeon: Will Jorja Loa, MD;  Location: [facility] INVASIVE CV LAB;  Service: Cardiovascular;   Laterality: N/A;  . Tee without cardioversion N/A 03/17/2015    Procedure: TRANSESOPHAGEAL ECHOCARDIOGRAM (TEE);  Surgeon: Chilton Si, MD;  Location: [facility] ENDOSCOPY;  Service: Cardiovascular;  Laterality: N/A;    Outpatient Prescriptions Prior to Visit  Medication Sig Dispense Refill  . aspirin 325 MG tablet Take 1 tablet (325 mg total) by mouth daily. 60 tablet 0  . aspirin-acetaminophen-caffeine (EXCEDRIN MIGRAINE) 250-250-65 MG tablet Take 2 tablets by mouth every 6 (six) hours as needed for headache or migraine.    Marland Kitchen atorvastatin (LIPITOR) 40 MG tablet Take 1 tablet (40 mg total) by mouth daily at 6 PM. 90 tablet 0  . castor oil liquid Take by mouth daily as needed for moderate constipation.    . cyclobenzaprine (FLEXERIL) 5 MG tablet Take 1 tablet (5 mg total) by mouth 3 (three) times daily as needed for muscle spasms. 30 tablet 0  . hydrochlorothiazide (HYDRODIURIL) 25 MG tablet TAKE ONE TABLET BY MOUTH ONCE DAILY 90 tablet 0  . HYDROcodone-homatropine (HYCODAN) 5-1.5 MG/5ML syrup Take 5 mLs by mouth at bedtime as needed. 50 mL 0  . traZODone (DESYREL) 50 MG tablet Take 0.5-1 tablets (25-50 mg total) by mouth at bedtime as needed for sleep. 30 tablet 3  . senna-docusate (SENOKOT-S) 8.6-50 MG tablet Take 1 tablet by mouth 2 (two) times daily. 30 tablet 0   No facility-administered medications prior to visit.    Allergies  Allergen Reactions  . Cephalexin Rash    Family History  Problem Relation Age of Onset  . Arthritis Father   .  Diabetes Father   . Hypertension Father   . Hyperlipidemia Father   . Hyperlipidemia Mother   . Heart attack Maternal Grandfather   . Stroke Paternal Grandfather     Social History  Substance Use Topics  . Smoking status: Never Smoker   . Smokeless tobacco: Never Used  . Alcohol Use: 0.0 oz/week    0 Standard drinks or equivalent per week     Comment: rarely    ROS: As per history of present illness, otherwise negative  BP 118/84  mmHg  Pulse 80  Ht 5\' 2"  (1.575 m)  Wt 161 lb 2 oz (73.086 kg)  BMI 29.46 kg/m2 Constitutional: Well-developed and well-nourished. No distress. HEENT: Normocephalic and atraumatic. Oropharynx is clear and moist. No oropharyngeal exudate. Conjunctivae are normal.  No scleral icterus. Neck: Neck supple. Trachea midline. Cardiovascular: Normal rate, regular rhythm and intact distal pulses. No M/R/G Pulmonary/chest: Effort normal and breath sounds normal. No wheezing, rales or rhonchi. Abdominal: Soft, nontender, nondistended. Bowel sounds active throughout. There are no masses palpable. No hepatosplenomegaly. Extremities: no clubbing, cyanosis, or edema Lymphadenopathy: No cervical adenopathy noted. Neurological: Alert and oriented to person place and time. Skin: Skin is warm and dry. No rashes noted. Mild dysphasia, speech intelligible at 98% Psychiatric: Normal mood and affect. Behavior is normal.  RELEVANT LABS AND IMAGING: CBC    Component Value Date/Time   WBC 6.2 04/03/2015 1043   RBC 5.35* 04/03/2015 1043   HGB 15.0 04/03/2015 1043   HCT 46.2* 04/03/2015 1043   PLT 311.0 04/03/2015 1043   MCV 86.5 04/03/2015 1043   MCH 28.4 03/14/2015 0930   MCHC 32.5 04/03/2015 1043   RDW 13.1 04/03/2015 1043   LYMPHSABS 3.4 03/14/2015 0106   MONOABS 0.4 03/14/2015 0106   EOSABS 0.1 03/14/2015 0106   BASOSABS 0.0 03/14/2015 0106    CMP     Component Value Date/Time   NA 137 03/16/2015 0408   K 4.3 03/16/2015 0408   CL 101 03/16/2015 0408   CO2 28 03/16/2015 0408   GLUCOSE 94 03/16/2015 0408   BUN 8 03/16/2015 0408   CREATININE 0.86 03/16/2015 0408   CALCIUM 9.1 03/16/2015 0408   PROT 6.4* 03/16/2015 0408   ALBUMIN 3.3* 03/16/2015 0408   AST 24 03/16/2015 0408   ALT 17 03/16/2015 0408   ALKPHOS 56 03/16/2015 0408   BILITOT 1.0 03/16/2015 0408   GFRNONAA >60 03/16/2015 0408   GFRAA >60 03/16/2015 0408    ASSESSMENT/PLAN: 44 year old female with history of ischemic stroke  in October with resultant dysphasia, hypertension, hyperlipidemia who is seen in consultation at the request of Dr. Chestine Spore to evaluate constipation and GERD.   1. Constipation -- long-standing.  Begin Linzess 145 g daily. Discontinue castor oil  2. History of colon polyps -- obtain records to determine when surveillance colonoscopy indicated. If polyp is adenomatous she would need repeat surveillance colonoscopy now  3. GERD -- mostly nocturnal without alarm symptoms. Begin ranitidine 150 mg daily at bedtime  4. CVA -- followed by neurology and undergoing speech and physical therapy  Return in 3 months, sooner if necessary   VZ:CHYIFOYDX Allayne Gitelman, Np [address] [place], [place] [address]   Addendum: Prior colonoscopy reviewed. Patient recalled colon polyps but in reality colonoscopy was normal. Records to be scanned Repeat colonoscopy recommended at age 46 yrs for screening

## 2015-05-14 NOTE — Telephone Encounter (Signed)
New message   4. Are you calling to see if we received your device transmission? Yes  Please call back and discuss how the remote check works

## 2015-05-15 ENCOUNTER — Telehealth: Payer: Self-pay | Admitting: Primary Care

## 2015-05-15 ENCOUNTER — Ambulatory Visit: Payer: Self-pay | Admitting: Primary Care

## 2015-05-15 ENCOUNTER — Encounter: Payer: Self-pay | Admitting: Speech Pathology

## 2015-05-15 NOTE — Telephone Encounter (Signed)
Form completed and handed to Robin.

## 2015-05-15 NOTE — Telephone Encounter (Signed)
Pt dropped of continuing disability claim form In [employer] IN BOX For review and signature

## 2015-05-16 ENCOUNTER — Ambulatory Visit (INDEPENDENT_AMBULATORY_CARE_PROVIDER_SITE_OTHER): Payer: Medicaid Other | Admitting: *Deleted

## 2015-05-16 DIAGNOSIS — I639 Cerebral infarction, unspecified: Secondary | ICD-10-CM

## 2015-05-16 NOTE — Telephone Encounter (Signed)
Spoke to pt  Pt aware paperwork is complete and ready for pick up  Pt stated she will turn in paperwork  Copy for pt Copy for scan Copy for file

## 2015-05-16 NOTE — Progress Notes (Signed)
Carelink Summary Report / Loop Recorder 

## 2015-05-19 ENCOUNTER — Telehealth: Payer: Self-pay | Admitting: Internal Medicine

## 2015-05-19 LAB — CUP PACEART REMOTE DEVICE CHECK: MDC IDC SESS DTM: 20161116183527

## 2015-05-19 MED ORDER — LINACLOTIDE 145 MCG PO CAPS: 145.0000 ug | ORAL_CAPSULE | Freq: Every day | ORAL | Status: DC

## 2015-05-19 NOTE — Telephone Encounter (Signed)
Rx resent.

## 2015-05-20 ENCOUNTER — Telehealth: Payer: Self-pay | Admitting: Primary Care

## 2015-05-20 NOTE — Telephone Encounter (Signed)
Completed and placed on Robins desk. 

## 2015-05-20 NOTE — Telephone Encounter (Signed)
Supplemental claimant statement dropped of to be filled out Forms in [employer] IN BOX for review and signature

## 2015-05-20 NOTE — Telephone Encounter (Signed)
Spoke with pt  Pt aware paperwork complete and faxed  Copy for pt Copy for file Copy for scan

## 2015-05-21 ENCOUNTER — Ambulatory Visit: Payer: Medicaid Other

## 2015-05-21 DIAGNOSIS — I69322 Dysarthria following cerebral infarction: Secondary | ICD-10-CM

## 2015-05-21 DIAGNOSIS — R4789 Other speech disturbances: Secondary | ICD-10-CM

## 2015-05-21 DIAGNOSIS — R479 Unspecified speech disturbances: Secondary | ICD-10-CM | POA: Diagnosis not present

## 2015-05-21 NOTE — Patient Instructions (Signed)
Stay slow with your speech, "let it roll"

## 2015-05-21 NOTE — Therapy (Signed)
[facility] [address] [place], [place], [address] Phone: [phone]   Fax:  [phone]  Speech Language Pathology Treatment  Patient Details  Name: Amber [name] MRN: [id number] Date of Birth: 1971/02/24 Referring Provider: Dr. Kenyon Ana  Encounter Date: 05/21/2015    Past Medical History  Diagnosis Date  . Asthma   . Depression   . Headache   . Hyperlipidemia   . Hypertension   . UTI (lower urinary tract infection)   . IBS (irritable bowel syndrome)   . Migraine   . Stroke [facility]) 03-13-2015    Past Surgical History  Procedure Laterality Date  . Abdominal hysterectomy  10/2006  . Toe surgery      Left 2nd metatarsal  . Cesarean section  2005  . Ep implantable device N/A 03/17/2015    Procedure: Loop Recorder Insertion;  Surgeon: Will Jorja Loa, MD;  Location: [facility] INVASIVE CV LAB;  Service: Cardiovascular;  Laterality: N/A;  . Tee without cardioversion N/A 03/17/2015    Procedure: TRANSESOPHAGEAL ECHOCARDIOGRAM (TEE);  Surgeon: Chilton Si, MD;  Location: [facility] ENDOSCOPY;  Service: Cardiovascular;  Laterality: N/A;    There were no vitals filed for this visit.  Visit Diagnosis: Dysfluency  Dysarthria due to recent stroke      Subjective Assessment - 05/21/15 1026    Subjective Pt arrives with fluent speech, mostly, walking to tx room.               ADULT SLP TREATMENT - 05/21/15 1042    General Information   Behavior/Cognition Alert;Cooperative;Pleasant mood   Pain Assessment   Pain Assessment No/denies pain   Cognitive-Linquistic Treatment   Treatment focused on Dysarthria  dysfluency   Skilled Treatment SLP engaged pt with mod complex/complex conversation for 30 minutes with min dysfluency - speech functional.    Assessment / Recommendations / Plan   Plan --  x1 every other week, pt agrees, due to progress   Progression Toward Goals   Progression toward goals  Progressing toward goals            SLP Short Term Goals - 05/13/15 1028    SLP SHORT TERM GOAL #1   Title Pt will utilize compensations for dysarthria/dysfluency during structured speech tasks.   Baseline Pt is not using compensations at phrase level    Status Achieved   SLP SHORT TERM GOAL #2   Title Pt will demonstrate fluent speech at phrase level 70% of utterances with mod A   Baseline Pt is fluent less than 20% of phrase level utterances   Status Achieved          SLP Long Term Goals - 05/21/15 1059    SLP LONG TERM GOAL #1   Title Pt will ultize compensations for dysarthria and dysfluency at least 75% of the time during 15 minute simple to mod complex conversation over two sessions   Baseline Pt not using compensations at phrase level   Status Achieved   SLP LONG TERM GOAL #2   Title Pt will demonstrate fluent speech over 8 minute conversation with min A over three sessions   Baseline Pt is dysfluent at phrase/sentence level   Time 5   Period Weeks   Status Revised          Plan - 05/21/15 1101    Clinical Impression Statement Pt continues to make progress with dysfluency and demonstrating compensations, even in mod complex conversation. SLP cont to believe pt could return to work  with her speech as it is currently. Continue skilled ST at once every other week, to maximize fluent speech for return to work.   Speech Therapy Frequency --  every other week   Duration --  3 more viisits   Treatment/Interventions SLP instruction and feedback;Compensatory strategies;Internal/external aids;Environmental controls;Patient/family education;Functional tasks;Multimodal communcation approach   Potential to Achieve Goals Good   Potential Considerations Financial resources        Problem List Patient Active Problem List   Diagnosis Date Noted  . Other fatigue 04/03/2015  . Constipation 03/31/2015  . Dysarthria   . Acute ischemic stroke (HCC) 03/14/2015  . Stroke  (HCC)   . Insomnia 09/29/2014  . IBS (irritable bowel syndrome) 09/29/2014  . Hyperlipidemia 09/29/2014  . Essential hypertension 09/29/2014    [facility] , MS, CCC-SLP 05/21/2015, 11:04 AM  [facility] [address] [place], [place], [address] Phone: [phone]   Fax:  [phone]   Name: Amber [name] MRN: [id number] Date of Birth: Feb 26, 1971

## 2015-05-27 ENCOUNTER — Encounter: Payer: Self-pay | Admitting: Speech Pathology

## 2015-05-27 ENCOUNTER — Ambulatory Visit: Payer: Self-pay | Admitting: Rehabilitation

## 2015-05-29 ENCOUNTER — Encounter: Payer: Self-pay | Admitting: Speech Pathology

## 2015-05-29 ENCOUNTER — Ambulatory Visit: Payer: Medicaid Other | Admitting: Rehabilitation

## 2015-06-05 ENCOUNTER — Encounter: Payer: Self-pay | Admitting: Neurology

## 2015-06-05 ENCOUNTER — Telehealth: Payer: Self-pay | Admitting: Internal Medicine

## 2015-06-05 ENCOUNTER — Ambulatory Visit (INDEPENDENT_AMBULATORY_CARE_PROVIDER_SITE_OTHER): Payer: Medicaid Other | Admitting: Neurology

## 2015-06-05 VITALS — BP 113/73 | HR 87 | Ht 62.0 in | Wt 162.0 lb

## 2015-06-05 DIAGNOSIS — I639 Cerebral infarction, unspecified: Secondary | ICD-10-CM | POA: Insufficient documentation

## 2015-06-05 NOTE — Telephone Encounter (Signed)
Left message for pt to call back.  Pt states

## 2015-06-05 NOTE — Progress Notes (Signed)
[facility] [address] [place]. [place] [address] [phone]       OFFICE FOLLOW-UP NOTE  [name] Date of Birth:  11-Sep-1970 Medical Record Number:  [id number]   HPI: 45 year old African-American lady seen today for first office follow-up visit following admission for stroke in October 2016.Amber [name] is an 45 y.o. female without significant PMH who reports that this evening while doing work at home she developed pain on the left side of her face. It was so severe that she was unable to continue her work. She went to The mirror and noted that her face did not look right. When she went to talk to her cousin her speech would not come out normally. EMS was called at that time and the patient was brought in as a code stroke. Initial NIHSS of 3. BP elevated. Date last known well: Date: 03/13/2015 Time last known well: Time: 23:00 tPA Given: No: Minimal symptoms, patient not consenting MRankin: 0  CT head of the brain on admission was unremarkable a CT angiogram of the head and neck revealed no evidence of dissection, vasculitis or stenosis. MRI scan of the brain showed a small acute ischemic nonhemorrhagic infarct involving the posterior left frontal cortical gray matter and white matter. There was also small remote age right parietal cortical infarct with possibly tiny additional remote left cerebellar infarcts as well. Mild changes of chronic small vessel disease. Transthoracic echo showed ejection fraction of 50%. Transesophageal echocardiogram showed no evidence of cardiac source of embolism or PFO or clot. Transcranial Doppler bubble study was negative. Hypercoagulable panel labs were negative. Vasculitic labs were also normal. LDL cholesterol was elevated at 170. Hemoglobin A1c was 6.1. Patient had loop recorder implanted and so for atrial fibrillation has not yet been found. She states she's done well since discharge. She is currently doing outpatient  physical and speech therapy. She still left some word finding difficulties and stuttering but mainly when she tries to talk fast or he is excited. She is still on disability but plans to start work next month part-time. She is been eating healthy and plans treatment on the [facility] and start exercising. Her blood pressure is well controlled and today it is 113/63. She is tolerating aspirin well without bleeding or bruising as well as Lipitor without side effects.  ROS:   14 system review of systems is positive for  snoring, diarrhea, allergies, confusion, slurred speech, not enough sleep, decreased energy, insomnia, migraines and all other systems negative  PMH:  Past Medical History  Diagnosis Date  . Asthma   . Depression   . Headache   . Hyperlipidemia   . Hypertension   . UTI (lower urinary tract infection)   . IBS (irritable bowel syndrome)   . Migraine   . Stroke [facility]) 03-13-2015    Social History:  Social History   Social History  . Marital Status: Married    Spouse Name: N/A  . Number of Children: N/A  . Years of Education: N/A   Occupational History  . Not on file.   Social History Main Topics  . Smoking status: Never Smoker   . Smokeless tobacco: Never Used  . Alcohol Use: 0.0 oz/week    0 Standard drinks or equivalent per week     Comment: rarely  . Drug Use: No  . Sexual Activity: Not on file   Other Topics Concern  . Not on file   Social History Narrative   Originally from [place]  Family lives up here in [place]   Has one daughter.   Enjoys spending time shopping and spending time with family        Medications:   Current Outpatient Prescriptions on File Prior to Visit  Medication Sig Dispense Refill  . aspirin 325 MG tablet Take 1 tablet (325 mg total) by mouth daily. 60 tablet 0  . aspirin-acetaminophen-caffeine (EXCEDRIN MIGRAINE) 250-250-65 MG tablet Take 2 tablets by mouth every 6 (six) hours as needed for headache or migraine.      Marland Kitchen atorvastatin (LIPITOR) 40 MG tablet Take 1 tablet (40 mg total) by mouth daily at 6 PM. 90 tablet 0  . cyclobenzaprine (FLEXERIL) 5 MG tablet Take 1 tablet (5 mg total) by mouth 3 (three) times daily as needed for muscle spasms. 30 tablet 0  . docusate sodium (COLACE) 100 MG capsule Take 100 mg by mouth 2 (two) times daily.    . hydrochlorothiazide (HYDRODIURIL) 25 MG tablet TAKE ONE TABLET BY MOUTH ONCE DAILY 90 tablet 0  . Linaclotide (LINZESS) 145 MCG CAPS capsule Take 1 capsule (145 mcg total) by mouth daily. 30 capsule 2  . ranitidine (ZANTAC) 150 MG tablet Take 1 tablet (150 mg total) by mouth at bedtime. 30 tablet 2  . traZODone (DESYREL) 50 MG tablet Take 0.5-1 tablets (25-50 mg total) by mouth at bedtime as needed for sleep. 30 tablet 3   No current facility-administered medications on file prior to visit.    Allergies:   Allergies  Allergen Reactions  . Cephalexin Rash    Physical Exam General: well developed, well nourished young African-American lady, seated, in no evident distress Head: head normocephalic and atraumatic.  Neck: supple with no carotid or supraclavicular bruits Cardiovascular: regular rate and rhythm, no murmurs Musculoskeletal: no deformity Skin:  no rash/petichiae Vascular:  Normal pulses all extremities Filed Vitals:   06/05/15 1308  BP: 113/73  Pulse: 87   Neurologic Exam Mental Status: Awake and fully alert. Oriented to place and time. Recent and remote memory intact. Attention span, concentration and fund of knowledge appropriate. Mood and affect appropriate. Speech mostly fluent with occasional word finding difficulties and disfluency. No paraphasic errors. Good comprehension and repetition. Cranial Nerves: Fundoscopic exam reveals sharp disc margins. Pupils equal, briskly reactive to light. Extraocular movements full without nystagmus. Visual fields full to confrontation. Hearing intact. Facial sensation intact. Face, tongue, palate moves  normally and symmetrically.  Motor: Normal bulk and tone. Normal strength in all tested extremity muscles. Sensory.: intact to touch ,pinprick .position and vibratory sensation.  Coordination: Rapid alternating movements normal in all extremities. Finger-to-nose and heel-to-shin performed accurately bilaterally. Gait and Station: Arises from chair without difficulty. Stance is normal. Gait demonstrates normal stride length and balance . Able to heel, toe and tandem walk without difficulty.  Reflexes: 1+ and symmetric. Toes downgoing.   NIHSS  1 Modified Rankin  1   ASSESSMENT: 45 year old lady with the embolic left MCA branch infarct in October 2016 of cryptogenic etiology with vascular risk factors of only hyperlipidemia and mild obesity. Extensive evaluation for vasculitis, hypercoagulable panel and cardiac source of embolism has been negative     PLAN: I had a long d/w patient about her recent stroke, risk for recurrent stroke/TIAs, personally independently reviewed imaging studies and stroke evaluation results and answered questions.Continue aspirin 325 mg daily  for secondary stroke prevention and maintain strict control of hypertension with blood pressure goal below 130/90, diabetes with hemoglobin A1c goal below 6.5% and lipids with  LDL cholesterol goal below 70 mg/dL. I also advised the patient to eat a healthy diet with plenty of whole grains, cereals, fruits and vegetables, exercise regularly and maintain ideal body weight. She was advised to continue ongoing outpatients speech therapy. She was cleared to drive and returned to work part-time next month and increase as tolerated Greater than 50% of time during this 25 minute visit was spent on counseling,explanation of diagnosis, planning of further management, discussion with patient and family and coordination of care .Followup in the future with me in 6 months or call earlier if necessary  Delia Heady, MD  Note: This document was  prepared with digital dictation and possible smart phrase technology. Any transcriptional errors that result from this process are unintentional

## 2015-06-05 NOTE — Patient Instructions (Signed)
I had a long d/w patient about her recent stroke, risk for recurrent stroke/TIAs, personally independently reviewed imaging studies and stroke evaluation results and answered questions.Continue aspirin 325 mg daily  for secondary stroke prevention and maintain strict control of hypertension with blood pressure goal below 130/90, diabetes with hemoglobin A1c goal below 6.5% and lipids with LDL cholesterol goal below 70 mg/dL. I also advised the patient to eat a healthy diet with plenty of whole grains, cereals, fruits and vegetables, exercise regularly and maintain ideal body weight. She was advised to continue ongoing outpatients speech therapy. She was cleared to drive and returned to work part-time next month and increase as tolerated Followup in the future with me in 6 months or call earlier if necessary

## 2015-06-06 ENCOUNTER — Ambulatory Visit: Payer: Medicaid Other | Attending: Internal Medicine

## 2015-06-06 ENCOUNTER — Ambulatory Visit: Payer: Medicaid Other | Admitting: Rehabilitation

## 2015-06-06 ENCOUNTER — Encounter: Payer: Self-pay | Admitting: Rehabilitation

## 2015-06-06 DIAGNOSIS — I69354 Hemiplegia and hemiparesis following cerebral infarction affecting left non-dominant side: Secondary | ICD-10-CM | POA: Diagnosis present

## 2015-06-06 DIAGNOSIS — M6281 Muscle weakness (generalized): Secondary | ICD-10-CM | POA: Insufficient documentation

## 2015-06-06 DIAGNOSIS — R4789 Other speech disturbances: Secondary | ICD-10-CM

## 2015-06-06 DIAGNOSIS — R42 Dizziness and giddiness: Secondary | ICD-10-CM | POA: Diagnosis present

## 2015-06-06 DIAGNOSIS — R269 Unspecified abnormalities of gait and mobility: Secondary | ICD-10-CM | POA: Insufficient documentation

## 2015-06-06 DIAGNOSIS — R479 Unspecified speech disturbances: Secondary | ICD-10-CM | POA: Insufficient documentation

## 2015-06-06 DIAGNOSIS — I69922 Dysarthria following unspecified cerebrovascular disease: Secondary | ICD-10-CM | POA: Insufficient documentation

## 2015-06-06 NOTE — Therapy (Signed)
[facility] [address] [place], [place], [address] Phone: [phone]   Fax:  [phone]  Physical Therapy Treatment and DC Summary  Patient Details  Name: Amber [name] MRN: [id number] Date of Birth: Nov 24, 1970 Referring Provider: Garvin Fila, MD  Encounter Date: 06/06/2015      PT End of Session - 06/06/15 1026    Visit Number 7   Number of Visits 7   Date for PT Re-Evaluation 05/30/15   Authorization Type Medicaid pending   PT Start Time 1019  late from SLP, didn't need full time due to DC   PT Stop Time 1057   PT Time Calculation (min) 38 min   Activity Tolerance Patient limited by pain   Behavior During Therapy Amber [name] for tasks assessed/performed      Past Medical History  Diagnosis Date  . Asthma   . Depression   . Headache   . Hyperlipidemia   . Hypertension   . UTI (lower urinary tract infection)   . IBS (irritable bowel syndrome)   . Migraine   . Stroke [name]) 03-13-2015    Past Surgical History  Procedure Laterality Date  . Abdominal hysterectomy  10/2006  . Toe surgery      Left 2nd metatarsal  . Cesarean section  2005  . Ep implantable device N/A 03/17/2015    Procedure: Loop Recorder Insertion;  Surgeon: Amber Meredith Leeds, MD;  Location: [name];  Service: Cardiovascular;  Laterality: N/A;  . Tee without cardioversion N/A 03/17/2015    Procedure: TRANSESOPHAGEAL ECHOCARDIOGRAM (TEE);  Surgeon: Amber Latch, MD;  Location: [name];  Service: Cardiovascular;  Laterality: N/A;    There were no vitals filed for this visit.  Visit Diagnosis:  Abnormality of gait  Dizziness and giddiness  Muscle weakness  Hemiplegia and hemiparesis following cerebral infarction affecting left non-dominant side (HCC)      Subjective Assessment - 06/06/15 1021    Subjective "I went to Dr. Leonie [name] yesterday and I got a good report, he is still not sure why I had the stroke but I  think it is because of my family history."    Pertinent History Goes by "Amber [name]". CVA (03/14/15), loop recorder placement, HTN, HLD, depression, asthma   Patient Stated Goals "To be able to tolerating walking more; and to keeping my balance without using any devices."   Currently in Pain? No/denies            Amber [name] PT Assessment - 06/06/15 1037    Functional Gait  Assessment   Gait assessed  Yes   Gait Level Surface Walks 20 ft in less than 5.5 sec, no assistive devices, good speed, no evidence for imbalance, normal gait pattern, deviates no more than 6 in outside of the 12 in walkway width.   Change in Gait Speed Able to smoothly change walking speed without loss of balance or gait deviation. Deviate no more than 6 in outside of the 12 in walkway width.   Gait with Horizontal Head Turns Performs head turns smoothly with no change in gait. Deviates no more than 6 in outside 12 in walkway width   Gait with Vertical Head Turns Performs head turns with no change in gait. Deviates no more than 6 in outside 12 in walkway width.   Gait and Pivot Turn Pivot turns safely within 3 sec and stops quickly with no loss of balance.   Step Over Obstacle Is able to step over 2 stacked shoe  boxes taped together (9 in total height) without changing gait speed. No evidence of imbalance.   Gait with Narrow Base of Support Is able to ambulate for 10 steps heel to toe with no staggering.   Gait with Eyes Closed Walks 20 ft, uses assistive device, slower speed, mild gait deviations, deviates 6-10 in outside 12 in walkway width. Ambulates 20 ft in less than 9 sec but greater than 7 sec.   Ambulating Backwards Walks 20 ft, uses assistive device, slower speed, mild gait deviations, deviates 6-10 in outside 12 in walkway width.   Steps Alternating feet, no rail.   Total Score 28         NMR: Pt performed FGA to re-assess final score as she had not been seen since 05/06/15.  Pt scored 28/30, indicative of no fall  risk and demonstrated marked improvement from 24/30 on the 6th.  See further details above.  Discussed results with pt.  Note gait speed is much improved from last session up to 4.20 ft/sec.    Self Care:  Pt able to state all warning signs and symptoms of stroke as well as risk factors in prevention of another CVA.  She states that she had discussed these with Dr. Leonie [name] on her visit the other day.    Gait:  Assessed gait on varying surfaces >1000' at mod I level.  Pt with no s/s of overt LOB, she was able to maintain conversation and perform head turns as needed without LOB.                      PT Education - 06/06/15 1026    Education provided Yes   Person(s) Educated Patient   Methods Explanation   Comprehension Verbalized understanding          PT Short Term Goals - 05/06/15 1025    PT SHORT TERM GOAL #1   Title Pt Amber perform home exercises with mod I using paper handout to maximize functional gains made in PT. Target date: 04/22/15   Baseline Met 11/29.   Status Achieved   PT SHORT TERM GOAL #2   Title Pt Amber improve FGA score from 18/30 to 20/30 to indicate improved in dynamic gait stability. Target date: 04/22/15   Baseline 24/30 on 05/06/15   Status Achieved   PT SHORT TERM GOAL #3   Title Pt Amber improve gait speed from 2.47 ft/sec to > 2.62 ft/sec to reach status of community ambulator. Target date: 04/22/15   Baseline 3.90 ft/sec    Status Achieved           PT Long Term Goals - 06/06/15 1026    PT LONG TERM GOAL #1   Title Pt Amber verbalize understanding of CVA warning signs, pertinent risk factors to prevent future CVA. Modified target date: 05/30/15   Baseline met 06/06/15   Status Achieved   PT LONG TERM GOAL #2   Title Pt Amber improve FGA score from 18 to 23/30 to indicate decreased fall risk. Target date: 05/13/15   Baseline 24/30 on 05/06/15, 28/30 on 06/06/15   Status Achieved   PT LONG TERM GOAL #3   Title Pt Amber improve gait velocity  from 2.47 ft/sec to > / = 3.07 ft/sec to indicate improved efficiency of ambulation. Target date: 05/13/15   Baseline 3.90 ft/sec on 05/06/15, 4.20 ft/sec on 06/06/15   Status Achieved   PT LONG TERM GOAL #4   Title Pt Amber independently ambulate x1,000'  over unlevel, paved surfaces to indicate safety with community mobility, progress toward PLOF. Modified target date: 05/30/15   Baseline met 06/06/15   Status Achieved   PT LONG TERM GOAL #5   Title --               Plan - 06/06/15 1026    Clinical Impression Statement Skilled session focused on addressing remainder of LTG's.  Note that she has met all 4/4 LTG's and has even increased gait speed and FGA score since last tested on 05/06/15.  Pt very pleased with progress and feels she is ready for D/C.  Encouraged pt to continue to pursue [employer] program to continue fitness and endurance for healthy lifestyle.  Pt verbalized understanding.     Pt Amber benefit from skilled therapeutic intervention in order to improve on the following deficits Abnormal gait;Decreased coordination;Decreased balance;Dizziness;Postural dysfunction;Pain;Decreased activity tolerance;Hypermobility   Rehab Potential Good   Clinical Impairments Affecting Rehab Potential financial limitations (pt self-pay)   PT Frequency 1x / week   PT Duration 6 weeks   PT Treatment/Interventions ADLs/Self Care Home Management;Gait training;Stair training;Functional mobility training;Therapeutic activities;Therapeutic exercise;Balance training;Neuromuscular re-education;Patient/family education;Vestibular;Manual techniques   PT Next Visit Plan DC   Consulted and Agree with Plan of Care Patient       PHYSICAL THERAPY DISCHARGE SUMMARY  Visits from Start of Care: 7  Current functional level related to goals / functional outcomes: See LTG's    Remaining deficits: Pt with high level vestibular deficits in which she has exercises for and has compensated for very well during mobility.     Education / Equipment: HEP, education on community fitness program.   Plan: Patient agrees to discharge.  Patient goals were met. Patient is being discharged due to meeting the stated rehab goals.  ?????         Problem List Patient Active Problem List   Diagnosis Date Noted  . Cryptogenic stroke ([place]) 06/05/2015  . Other fatigue 04/03/2015  . Constipation 03/31/2015  . Dysarthria   . Acute ischemic stroke ([place]) 03/14/2015  . Stroke ([place])   . Insomnia 09/29/2014  . IBS (irritable bowel syndrome) 09/29/2014  . Hyperlipidemia 09/29/2014  . Essential hypertension 09/29/2014    Cameron Sprang, PT, MPT [name] [address] [place], [place], [address] Phone: [phone]   Fax:  [phone] 06/06/2015, 10:58 AM  Name: Amber [name] MRN: [id number] Date of Birth: 1970-10-01

## 2015-06-06 NOTE — Therapy (Signed)
[facility] [address] [place], [place], [address] Phone: [phone]   Fax:  [phone]  Speech Language Pathology Treatment  Patient Details  Name: Amber [name] MRN: [id number] Date of Birth: [birth year] Referring Provider: Dr. Kenyon Ana  Encounter Date: 06/06/2015      End of Session - 06/06/15 0952    Visit Number 8   Number of Visits 17   Date for SLP Re-Evaluation 07/04/15   SLP Start Time 0936   SLP Stop Time  1015   SLP Time Calculation (min) 39 min      Past Medical History  Diagnosis Date  . Asthma   . Depression   . Headache   . Hyperlipidemia   . Hypertension   . UTI (lower urinary tract infection)   . IBS (irritable bowel syndrome)   . Migraine   . Stroke [facility]) 03-13-2015    Past Surgical History  Procedure Laterality Date  . Abdominal hysterectomy  10/2006  . Toe surgery      Left 2nd metatarsal  . Cesarean section  2005  . Ep implantable device N/A 03/17/2015    Procedure: Loop Recorder Insertion;  Surgeon: Will Jorja Loa, MD;  Location: [facility] INVASIVE CV LAB;  Service: Cardiovascular;  Laterality: N/A;  . Tee without cardioversion N/A 03/17/2015    Procedure: TRANSESOPHAGEAL ECHOCARDIOGRAM (TEE);  Surgeon: Chilton Si, MD;  Location: [facility] ENDOSCOPY;  Service: Cardiovascular;  Laterality: N/A;    There were no vitals filed for this visit.  Visit Diagnosis: Dysfluency      Subjective Assessment - 06/06/15 0941    Subjective Pt arrives with fluent speech, mostly, walking to tx room.               ADULT SLP TREATMENT - 06/06/15 1009    General Information   Behavior/Cognition Alert;Cooperative;Pleasant mood   Pain Assessment   Pain Assessment No/denies pain   Cognitive-Linquistic Treatment   Treatment focused on Dysarthria  dysfluency   Skilled Treatment SLP engaged pt in conversation of complex nature. Pt spoke with mild dysfluency - pt toldl SLP she was  talking with approx 60% mindfulness re: overarticulation and  reduced rate. SLP encouraged pt to hit 70% in the next 6 minutes of mod complex conversation and pt did so. In approx 10 inutes of conversation following this, pt stated she hit 75% and SLP agreed with pt. SLP reiterated incr'd fluency was heard during that time. SLP educated pt re: external reminders and pt stated she could wear ring or bracelets to be external           SLP Education - 06/06/15 1020    Education provided Yes   Education Details external reminders for speech compensations   Person(s) Educated Patient   Methods Explanation   Comprehension Verbalized understanding          SLP Short Term Goals - 05/13/15 1028    SLP SHORT TERM GOAL #1   Title Pt will utilize compensations for dysarthria/dysfluency during structured speech tasks.   Baseline Pt is not using compensations at phrase level    Status Achieved   SLP SHORT TERM GOAL #2   Title Pt will demonstrate fluent speech at phrase level 70% of utterances with mod A   Baseline Pt is fluent less than 20% of phrase level utterances   Status Achieved          SLP Long Term Goals - 06/06/15 0957    SLP LONG TERM GOAL #  1   Title Pt will ultize compensations for dysarthria and dysfluency at least 75% of the time during 15 minute simple to mod complex conversation over two sessions   Baseline Pt not using compensations at phrase level   Status Achieved   SLP LONG TERM GOAL #2   Title Pt will demonstrate fluent speech over 8 minute conversation with min A over three sessions   Baseline Pt is dysfluent at phrase/sentence level ; 06-06-15~2 sessions   Time 4   Period Weeks   Status Revised          Plan - 06/06/15 0953    Clinical Impression Statement Pt continues to make progress with dysfluency and demonstrating compensations, even in mod complex conversation. SLP cont to believe pt could return to work with her speech as it is currently. Continue skilled  ST at once every other week or 1-2 more sessions, to maximize fluent speech for return to work.   Speech Therapy Frequency --  every other week   Duration --  3 more viisits   Treatment/Interventions SLP instruction and feedback;Compensatory strategies;Internal/external aids;Environmental controls;Patient/family education;Functional tasks;Multimodal communcation approach   Potential to Achieve Goals Good   Potential Considerations Financial resources        Problem List Patient Active Problem List   Diagnosis Date Noted  . Cryptogenic stroke (HCC) 06/05/2015  . Other fatigue 04/03/2015  . Constipation 03/31/2015  . Dysarthria   . Acute ischemic stroke (HCC) 03/14/2015  . Stroke (HCC)   . Insomnia 09/29/2014  . IBS (irritable bowel syndrome) 09/29/2014  . Hyperlipidemia 09/29/2014  . Essential hypertension 09/29/2014    [facility] , MS, CCC-SLP 06/06/2015, 10:21 AM  [facility] [address] [place], [place], [address] Phone: [phone]   Fax:  [phone]   Name: Amber [name] MRN: [id number] Date of Birth: 03-25-1971

## 2015-06-09 ENCOUNTER — Other Ambulatory Visit: Payer: Self-pay | Admitting: Primary Care

## 2015-06-09 ENCOUNTER — Telehealth: Payer: Self-pay | Admitting: *Deleted

## 2015-06-09 DIAGNOSIS — R7303 Prediabetes: Secondary | ICD-10-CM

## 2015-06-09 DIAGNOSIS — E785 Hyperlipidemia, unspecified: Secondary | ICD-10-CM

## 2015-06-09 DIAGNOSIS — E559 Vitamin D deficiency, unspecified: Secondary | ICD-10-CM

## 2015-06-09 NOTE — Telephone Encounter (Signed)
Dr Rhea Belton has received records from [facility] (phone [phone]). Per Dr Rhea Belton, "Records reviewed. Patient recalled colon polyps-colonoscopy reviewed and negative for polyps. Repeat test age 45 for screening." Recall has been placed in EPIC for 08/30/2023.

## 2015-06-11 ENCOUNTER — Encounter: Payer: Self-pay | Admitting: Internal Medicine

## 2015-06-11 ENCOUNTER — Other Ambulatory Visit: Payer: Self-pay | Admitting: Primary Care

## 2015-06-11 ENCOUNTER — Telehealth: Payer: Self-pay

## 2015-06-11 DIAGNOSIS — Z111 Encounter for screening for respiratory tuberculosis: Secondary | ICD-10-CM

## 2015-06-11 NOTE — Telephone Encounter (Signed)
Order placed

## 2015-06-11 NOTE — Telephone Encounter (Signed)
Electronically refill request for   hydrochlorothiazide (HYDRODIURIL) 25 MG tablet   Take 1 tablet (25 mg total) by mouth daily.  Dispense: 90 tablet   Refills: 0     Last prescribed on 03/21/2015. Last seen on 05/08/2015. Follow up on 09/08/2015

## 2015-06-11 NOTE — Telephone Encounter (Signed)
Pt never returned call

## 2015-06-11 NOTE — Telephone Encounter (Signed)
Called and notified patient of Kate's comments. Patient verbalized understanding. Patient will get lab done on 06/19/2015.

## 2015-06-11 NOTE — Telephone Encounter (Signed)
Pt request lab test instead of tb skin test for pts work; pt said years ago she had a TB skin test and broke out all over with rash; since then has had blood test done instead of PPD. Pt request cb when lab test ordered.

## 2015-06-16 ENCOUNTER — Ambulatory Visit (INDEPENDENT_AMBULATORY_CARE_PROVIDER_SITE_OTHER): Payer: Medicaid Other | Admitting: *Deleted

## 2015-06-16 DIAGNOSIS — I639 Cerebral infarction, unspecified: Secondary | ICD-10-CM

## 2015-06-17 ENCOUNTER — Telehealth: Payer: Self-pay | Admitting: Primary Care

## 2015-06-17 NOTE — Telephone Encounter (Signed)
Place paperwork in Kate's inbox

## 2015-06-17 NOTE — Telephone Encounter (Signed)
Pt dropped off disability update form to be signed by provider.  Placing in rx tower. Please call when ready to pick up  Thank you

## 2015-06-17 NOTE — Progress Notes (Signed)
Carelink Summary Report / Loop Recorder 

## 2015-06-17 NOTE — Telephone Encounter (Signed)
Forms completed and placed in Chan's inbox.

## 2015-06-18 ENCOUNTER — Telehealth: Payer: Self-pay | Admitting: Internal Medicine

## 2015-06-18 MED ORDER — LUBIPROSTONE 8 MCG PO CAPS: 8.0000 ug | ORAL_CAPSULE | Freq: Two times a day (BID) | ORAL | Status: DC

## 2015-06-18 NOTE — Telephone Encounter (Signed)
Spoke with pt and she is aware. Records are scanned in epic. Script sent to pharmacy.

## 2015-06-18 NOTE — Telephone Encounter (Signed)
Called and spoken to patient. Forms are ready for pick. Left in front office. Also faxed copy to [employer] at [phone].

## 2015-06-18 NOTE — Telephone Encounter (Signed)
D/c Linzess Trial of Amitiza 8 mcg BID Have her call with update after 2-4 weeks

## 2015-06-18 NOTE — Telephone Encounter (Signed)
Pt has been taking linzess 145 since before Christmas and states all of her BM's are diarrhea. States she is starting to have some irritation to her bottom and is having to wear a tucks pad. Pt wants to know what else she can try. Please advise.  Pt also wants to know if we received her outside colon reports.

## 2015-06-19 ENCOUNTER — Other Ambulatory Visit (INDEPENDENT_AMBULATORY_CARE_PROVIDER_SITE_OTHER): Payer: Medicaid Other

## 2015-06-19 DIAGNOSIS — Z111 Encounter for screening for respiratory tuberculosis: Secondary | ICD-10-CM

## 2015-06-19 DIAGNOSIS — E785 Hyperlipidemia, unspecified: Secondary | ICD-10-CM | POA: Diagnosis not present

## 2015-06-19 DIAGNOSIS — E559 Vitamin D deficiency, unspecified: Secondary | ICD-10-CM

## 2015-06-19 DIAGNOSIS — R7303 Prediabetes: Secondary | ICD-10-CM | POA: Diagnosis not present

## 2015-06-19 LAB — LIPID PANEL
Cholesterol: 185 mg/dL (ref 0–200)
HDL: 70 mg/dL (ref >39.00)
LDL Cholesterol: 102 mg/dL — ABNORMAL HIGH (ref 0–99)
NONHDL: 114.71
TRIGLYCERIDES: 63 mg/dL (ref 0.0–149.0)
Total CHOL/HDL Ratio: 3
VLDL: 12.6 mg/dL (ref 0.0–40.0)

## 2015-06-19 LAB — VITAMIN D 25 HYDROXY (VIT D DEFICIENCY, FRACTURES): VITD: 44.35 ng/mL (ref 30.00–100.00)

## 2015-06-19 LAB — HEMOGLOBIN A1C: HEMOGLOBIN A1C: 6.2 % (ref 4.6–6.5)

## 2015-06-20 ENCOUNTER — Ambulatory Visit: Payer: Medicaid Other

## 2015-06-20 DIAGNOSIS — R4789 Other speech disturbances: Secondary | ICD-10-CM

## 2015-06-20 DIAGNOSIS — R479 Unspecified speech disturbances: Secondary | ICD-10-CM | POA: Diagnosis not present

## 2015-06-20 DIAGNOSIS — I69322 Dysarthria following cerebral infarction: Secondary | ICD-10-CM

## 2015-06-20 NOTE — Therapy (Signed)
[facility] [address] [place], [place], [address] Phone: [phone]   Fax:  [phone]  Speech Language Pathology Treatment  Patient Details  Name: Amber [name] MRN: [id number] Date of Birth: 1971-03-10 Referring Provider: Dr. Ulyses Jarred  Encounter Date: 06/20/2015      End of Session - 06/20/15 1646    Visit Number 9   Number of Visits 17   Date for SLP Re-Evaluation 07/04/15   SLP Start Time 1448   SLP Stop Time  42   SLP Time Calculation (min) 40 min   Activity Tolerance Patient tolerated treatment well      Past Medical History  Diagnosis Date  . Asthma   . Depression   . Headache   . Hyperlipidemia   . Hypertension   . UTI (lower urinary tract infection)   . IBS (irritable bowel syndrome)   . Migraine   . Stroke [facility]) 03-13-2015    Past Surgical History  Procedure Laterality Date  . Abdominal hysterectomy  10/2006  . Toe surgery      Left 2nd metatarsal  . Cesarean section  2005  . Ep implantable device N/A 03/17/2015    Procedure: Loop Recorder Insertion;  Surgeon: Will Meredith Leeds, MD;  Location: [facility];  Service: Cardiovascular;  Laterality: N/A;  . Tee without cardioversion N/A 03/17/2015    Procedure: TRANSESOPHAGEAL ECHOCARDIOGRAM (TEE);  Surgeon: Skeet Latch, MD;  Location: [facility] ENDOSCOPY;  Service: Cardiovascular;  Laterality: N/A;    There were no vitals filed for this visit.  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   Currently in Pain? No/denies               ADULT SLP TREATMENT - 06/20/15 1503    General Information   Behavior/Cognition Alert;Cooperative;Pleasant mood   Treatment Provided   Treatment provided Cognitive-Linquistic   Cognitive-Linquistic Treatment   Treatment focused on Dysarthria  dysfluency   Skilled Treatment Pt maintained speech WNL/WFL speech for entire session with rare dysfluency.  She agrees that d/c is necessary at this time.   Assessment / Recommendations / Plan   Plan Discharge SLP treatment due to (comment)  goals met   Progression Toward Goals   Progression toward goals Goals met, education completed, patient discharged from [facility] - 05/13/15 [place] #1   Title Pt will utilize compensations for dysarthria/dysfluency during structured speech tasks.   Baseline Pt is not using compensations at phrase level    Status Achieved   SLP SHORT TERM GOAL #2   Title Pt will demonstrate fluent speech at phrase level 70% of utterances with mod A   Baseline Pt is fluent less than 20% of phrase level utterances   Status Achieved          SLP Long Term Goals - 06/20/15 1648    SLP LONG TERM GOAL #1   Title Pt will ultize compensations for dysarthria and dysfluency at least 75% of the time during 15 minute simple to mod complex conversation over two sessions   Baseline Pt not using compensations at phrase level   Status Achieved   SLP LONG TERM GOAL #2   Title Pt will demonstrate fluent speech over 8 minute conversation with  min A over three sessions   Baseline Pt is dysfluent at phrase/sentence level    Status Achieved          Plan - 06/20/15 1646    Clinical Impression Statement Pt has made excellent progress with dysfluency and demonstrating compensations, even in mod complex conversation. Pt agrees that the time is right for d/c from [facility].   Speech Therapy Frequency --   Duration --   Treatment/Interventions SLP instruction and feedback;Compensatory strategies;Internal/external aids;Environmental controls;Patient/family education;Functional tasks   Potential to Achieve Goals Good   Potential Considerations Financial resources     SPEECH THERAPY DISCHARGE SUMMARY  Visits from Start of Care: 9  Current  functional level related to goals / functional outcomes: Pt has improved dramatically over the course of therapy, now, in mod complex conversation she exhibits only mild occasional dysfluency. See goal summary above. She is pleased with progress and agrees that discharge is appropriate at this time.   Remaining deficits: Mild dysfluency   Education / Equipment: Compensations to achieve fluent speech.  Plan: Patient agrees to discharge.  Patient goals were met. Patient is being discharged due to meeting the stated rehab goals.  ?????        Problem List Patient Active Problem List   Diagnosis Date Noted  . 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 Other fatigue 04/03/2015  . Constipation 03/31/2015  . Dysarthria   . Acute ischemic stroke ([place]) 03/14/2015  . Stroke ([place])   . Insomnia 09/29/2014  . IBS (irritable bowel syndrome) 09/29/2014  . Hyperlipidemia 09/29/2014  . Essential hypertension 09/29/2014    [facility] , [place], [place]  06/20/2015, 4:49 PM  [facility] [address] [place], [place], [address] Phone: [phone]   Fax:  [phone]   Name: Amber [name] MRN: [id number] Date of Birth: 1970-10-26

## 2015-06-21 LAB — QUANTIFERON TB GOLD ASSAY (BLOOD)
INTERFERON GAMMA RELEASE ASSAY: NEGATIVE
Quantiferon Nil Value: 0.04 IU/mL
Quantiferon Tb Ag Minus Nil Value: 0 IU/mL
TB Ag value: 0.04 IU/mL

## 2015-06-23 ENCOUNTER — Encounter: Payer: Self-pay | Admitting: *Deleted

## 2015-06-24 ENCOUNTER — Telehealth: Payer: Self-pay | Admitting: Primary Care

## 2015-06-24 NOTE — Telephone Encounter (Signed)
Pt dropped off disability ppw to be filled out. Placing in rx tower. Pt is asking for you to fax ppw to number on letter  Thank you

## 2015-06-24 NOTE — Telephone Encounter (Signed)
Completed and placed on [place] desk for completion and review.

## 2015-06-24 NOTE — Telephone Encounter (Signed)
Place form in Kate's inbox. 

## 2015-06-26 ENCOUNTER — Telehealth: Payer: Self-pay | Admitting: Primary Care

## 2015-06-26 NOTE — Telephone Encounter (Signed)
Completed and placed in Chans box for fax.

## 2015-06-26 NOTE — Telephone Encounter (Signed)
Called and spoke to patient. Notified patient that I need her signature on the paperwork. Patient then came to the office and sign it.  The faxed the paperwork to [employer] at [phone]

## 2015-06-26 NOTE — Telephone Encounter (Signed)
Place form in Kate's inbox. 

## 2015-06-26 NOTE — Telephone Encounter (Signed)
Pt dropped off paperwork for unemployment. Please cal when completed and ready for pick up  Thank you  putting in rx tower

## 2015-06-26 NOTE — Telephone Encounter (Signed)
Paperwork faxed  °Pt aware °Copy for pt °Copy for scan °Copy for file °

## 2015-07-03 ENCOUNTER — Telehealth: Payer: Self-pay | Admitting: Primary Care

## 2015-07-03 NOTE — Telephone Encounter (Signed)
Completed and placed in Chans inbox. 

## 2015-07-03 NOTE — Telephone Encounter (Signed)
Placed form in Kate's inbox 

## 2015-07-03 NOTE — Telephone Encounter (Signed)
Pt dropped off 2 forms to be filled out and signed. She attached a note explaining what needed to be done. She stated the [place] for [employer] form needs to be sent in no later than Monday 07/07/15. Best number to reach pt is [phone]. Placing ppw in Kate's rx tower spot.

## 2015-07-04 LAB — CUP PACEART REMOTE DEVICE CHECK: MDC IDC SESS DTM: 20161210044012

## 2015-07-04 NOTE — Telephone Encounter (Signed)
Faxed to [phone] Attn: [place]. Called and notified patient paperwork has been faxed. Original paperwork left in office for patient to pick up.

## 2015-07-09 ENCOUNTER — Other Ambulatory Visit: Payer: Self-pay

## 2015-07-09 DIAGNOSIS — M62830 Muscle spasm of back: Secondary | ICD-10-CM

## 2015-07-09 MED ORDER — CYCLOBENZAPRINE HCL 5 MG PO TABS: 5.0000 mg | ORAL_TABLET | Freq: Three times a day (TID) | ORAL | Status: DC | PRN

## 2015-07-09 NOTE — Telephone Encounter (Signed)
Pt left v/m requesting refill cyclobenzaprine to [facility] [address]. Pt last seen and rx filled # [phone] on 05/08/15.

## 2015-07-14 ENCOUNTER — Other Ambulatory Visit: Payer: Self-pay | Admitting: Internal Medicine

## 2015-07-15 ENCOUNTER — Ambulatory Visit (INDEPENDENT_AMBULATORY_CARE_PROVIDER_SITE_OTHER): Payer: Medicaid Other | Admitting: *Deleted

## 2015-07-15 DIAGNOSIS — I639 Cerebral infarction, unspecified: Secondary | ICD-10-CM

## 2015-07-15 NOTE — Progress Notes (Signed)
Carelink Summary Report / Loop Recorder 

## 2015-07-16 ENCOUNTER — Telehealth: Payer: Self-pay | Admitting: Primary Care

## 2015-07-16 NOTE — Telephone Encounter (Signed)
Called and spoken to patient. She stated she wants to give a heads up regarding the unemployment. There are more paperwork that she may need Jae Dire to complete. She will let us know later.

## 2015-07-16 NOTE — Telephone Encounter (Signed)
Patient is asking for Johny Drilling to call her back about unemployment benefits.  Patient said she received some feedback from the unemployment commission.

## 2015-07-16 NOTE — Telephone Encounter (Signed)
Placed paperwork in Kate's inbox. 

## 2015-07-16 NOTE — Telephone Encounter (Signed)
Pt dropped off paperwork regarding unemployment. Placing in rx tower,  Thanks

## 2015-07-16 NOTE — Telephone Encounter (Signed)
Forms completed and placed in Chan's inbox. 

## 2015-07-17 NOTE — Telephone Encounter (Signed)
Called patient and notified her that form has been faxed. Originals are left in front office for her pick up.

## 2015-07-17 NOTE — Telephone Encounter (Signed)
Patient called to find out if her forms are ready.  Please call patient back at [phone].

## 2015-07-28 ENCOUNTER — Telehealth: Payer: Self-pay | Admitting: *Deleted

## 2015-07-28 ENCOUNTER — Other Ambulatory Visit: Payer: Self-pay | Admitting: Internal Medicine

## 2015-07-28 DIAGNOSIS — Z0271 Encounter for disability determination: Secondary | ICD-10-CM

## 2015-07-28 NOTE — Telephone Encounter (Signed)
Called patient to advised that she does not need to send daily manual LINQ transmissions from her Carelink monitor.  Advised that device should send transmissions automatically overnight and that we will contact her if there are any transmission issues.  Patient verbalizes understanding of instructions and denies questions or concerns at this time.

## 2015-08-04 LAB — CUP PACEART REMOTE DEVICE CHECK: Date Time Interrogation Session: 20170116054719

## 2015-08-04 NOTE — Progress Notes (Signed)
Carelink summary report received. Battery status OK. Normal device function. No new symptom episodes, tachy episodes, brady, or pause episodes. No new AF episodes. Monthly summary reports and ROV/PRN 

## 2015-08-06 LAB — CUP PACEART REMOTE DEVICE CHECK: Date Time Interrogation Session: 20170214190610

## 2015-08-06 NOTE — Progress Notes (Signed)
Carelink summary report received. Battery status OK. Normal device function. No new symptom episodes, tachy episodes, brady, or pause episodes. No new AF episodes. Monthly summary reports and ROV/PRN 

## 2015-08-14 ENCOUNTER — Ambulatory Visit (INDEPENDENT_AMBULATORY_CARE_PROVIDER_SITE_OTHER): Payer: Medicaid Other | Admitting: *Deleted

## 2015-08-14 DIAGNOSIS — I639 Cerebral infarction, unspecified: Secondary | ICD-10-CM | POA: Diagnosis not present

## 2015-08-15 NOTE — Progress Notes (Signed)
Carelink Summary Report / Loop Recorder 

## 2015-08-20 ENCOUNTER — Other Ambulatory Visit: Payer: Self-pay | Admitting: Primary Care

## 2015-08-21 ENCOUNTER — Other Ambulatory Visit: Payer: Self-pay | Admitting: Internal Medicine

## 2015-08-28 ENCOUNTER — Encounter: Payer: Self-pay | Admitting: Primary Care

## 2015-08-28 ENCOUNTER — Ambulatory Visit (INDEPENDENT_AMBULATORY_CARE_PROVIDER_SITE_OTHER): Payer: Medicaid Other | Admitting: Primary Care

## 2015-08-28 VITALS — BP 112/74 | HR 93 | Temp 98.1°F | Ht 62.0 in | Wt 158.8 lb

## 2015-08-28 DIAGNOSIS — J069 Acute upper respiratory infection, unspecified: Secondary | ICD-10-CM | POA: Diagnosis not present

## 2015-08-28 NOTE — Patient Instructions (Signed)
Your symptoms are related to a viral illness that will pass on its own in time.  Continue Mucinex DM and natural honey as discussed.  Please notify me if your cough becomes worse.  Please notify me if you develop persistent fevers of 101, start coughing up green mucous, notice increased fatigue or weakness, or feel worse after 1 week of onset of symptoms.   Increase consumption of water intake and rest.  It was a pleasure to see you today!  Upper Respiratory Infection, Adult Most upper respiratory infections (URIs) are a viral infection of the air passages leading to the lungs. A URI affects the nose, throat, and upper air passages. The most common type of URI is nasopharyngitis and is typically referred to as "the common cold." URIs run their course and usually go away on their own. Most of the time, a URI does not require medical attention, but sometimes a bacterial infection in the upper airways can follow a viral infection. This is called a secondary infection. Sinus and middle ear infections are common types of secondary upper respiratory infections. Bacterial pneumonia can also complicate a URI. A URI can worsen asthma and chronic obstructive pulmonary disease (COPD). Sometimes, these complications can require emergency medical care and may be life threatening.  CAUSES Almost all URIs are caused by viruses. A virus is a type of germ and can spread from one person to another.  RISKS FACTORS You may be at risk for a URI if:   You smoke.   You have chronic heart or lung disease.  You have a weakened defense (immune) system.   You are very young or very old.   You have nasal allergies or asthma.  You work in crowded or poorly ventilated areas.  You work in health care facilities or schools. SIGNS AND SYMPTOMS  Symptoms typically develop 2-3 days after you come in contact with a cold virus. Most viral URIs last 7-10 days. However, viral URIs from the influenza virus (flu virus)  can last 14-18 days and are typically more severe. Symptoms may include:   Runny or stuffy (congested) nose.   Sneezing.   Cough.   Sore throat.   Headache.   Fatigue.   Fever.   Loss of appetite.   Pain in your forehead, behind your eyes, and over your cheekbones (sinus pain).  Muscle aches.  DIAGNOSIS  Your health care provider may diagnose a URI by:  Physical exam.  Tests to check that your symptoms are not due to another condition such as:  Strep throat.  Sinusitis.  Pneumonia.  Asthma. TREATMENT  A URI goes away on its own with time. It cannot be cured with medicines, but medicines may be prescribed or recommended to relieve symptoms. Medicines may help:  Reduce your fever.  Reduce your cough.  Relieve nasal congestion. HOME CARE INSTRUCTIONS   Take medicines only as directed by your health care provider.   Gargle warm saltwater or take cough drops to comfort your throat as directed by your health care provider.  Use a warm mist humidifier or inhale steam from a shower to increase air moisture. This may make it easier to breathe.  Drink enough fluid to keep your urine clear or pale yellow.   Eat soups and other clear broths and maintain good nutrition.   Rest as needed.   Return to work when your temperature has returned to normal or as your health care provider advises. You may need to stay home longer to  avoid infecting others. You can also use a face mask and careful hand washing to prevent spread of the virus.  Increase the usage of your inhaler if you have asthma.   Do not use any tobacco products, including cigarettes, chewing tobacco, or electronic cigarettes. If you need help quitting, ask your health care provider. PREVENTION  The best way to protect yourself from getting a cold is to practice good hygiene.   Avoid oral or hand contact with people with cold symptoms.   Wash your hands often if contact occurs.  There is  no clear evidence that vitamin C, vitamin E, echinacea, or exercise reduces the chance of developing a cold. However, it is always recommended to get plenty of rest, exercise, and practice good nutrition.  SEEK MEDICAL CARE IF:   You are getting worse rather than better.   Your symptoms are not controlled by medicine.   You have chills.  You have worsening shortness of breath.  You have brown or red mucus.  You have yellow or brown nasal discharge.  You have pain in your face, especially when you bend forward.  You have a fever.  You have swollen neck glands.  You have pain while swallowing.  You have white areas in the back of your throat. SEEK IMMEDIATE MEDICAL CARE IF:   You have severe or persistent:  Headache.  Ear pain.  Sinus pain.  Chest pain.  You have chronic lung disease and any of the following:  Wheezing.  Prolonged cough.  Coughing up blood.  A change in your usual mucus.  You have a stiff neck.  You have changes in your:  Vision.  Hearing.  Thinking.  Mood. MAKE SURE YOU:   Understand these instructions.  Will watch your condition.  Will get help right away if you are not doing well or get worse.   This information is not intended to replace advice given to you by your health care provider. Make sure you discuss any questions you have with your health care provider.   Document Released: 11/10/2000 Document Revised: 10/01/2014 Document Reviewed: 08/22/2013 Elsevier Interactive Patient Education [employer].

## 2015-08-28 NOTE — Progress Notes (Signed)
Subjective:    Patient ID: Amber [name], female    DOB: 25-Nov-1970, 45 y.o.   MRN: [id number]  HPI  Amber [name] is a 45 year old female who presents today with a chief complaint of cough. She also reports voice hoariness, fatigue, chest and nasal congestion. Her cough is non productive. Her symptoms have been present since Monday this week. She's taken natural honey and Mucinex DM with temporary improvement. Overall her cough is improved. Denies sick contacts, fevers, nausea.   Review of Systems  Constitutional: Positive for fatigue. Negative for fever and chills.  HENT: Positive for congestion and sore throat.   Respiratory: Positive for cough. Negative for shortness of breath.   Cardiovascular: Negative for chest pain.  Gastrointestinal: Negative for nausea.       Past Medical History  Diagnosis Date  . Asthma   . Depression   . Headache   . Hyperlipidemia   . Hypertension   . UTI (lower urinary tract infection)   . IBS (irritable bowel syndrome)   . Migraine   . Stroke [facility]) 03-13-2015    Social History   Social History  . Marital Status: Married    Spouse Name: N/A  . Number of Children: N/A  . Years of Education: N/A   Occupational History  . Not on file.   Social History Main Topics  . Smoking status: Never Smoker   . Smokeless tobacco: Never Used  . Alcohol Use: 0.0 oz/week    0 Standard drinks or equivalent per week     Comment: rarely  . Drug Use: No  . Sexual Activity: Not on file   Other Topics Concern  . Not on file   Social History Narrative   Originally from [place]   Family lives up here in [place]   Has one daughter.   Enjoys spending time shopping and spending time with family        Past Surgical History  Procedure Laterality Date  . Abdominal hysterectomy  10/2006  . Toe surgery      Left 2nd metatarsal  . Cesarean section  2005  . Ep implantable device N/A 03/17/2015    Procedure: Loop Recorder Insertion;   Surgeon: Will Jorja Loa, MD;  Location: [facility] INVASIVE CV LAB;  Service: Cardiovascular;  Laterality: N/A;  . Tee without cardioversion N/A 03/17/2015    Procedure: TRANSESOPHAGEAL ECHOCARDIOGRAM (TEE);  Surgeon: Chilton Si, MD;  Location: [facility] ENDOSCOPY;  Service: Cardiovascular;  Laterality: N/A;    Family History  Problem Relation Age of Onset  . Arthritis Father   . Diabetes Father   . Hypertension Father   . Hyperlipidemia Father   . Hyperlipidemia Mother   . Heart attack Maternal Grandfather   . Stroke Paternal Grandfather     Allergies  Allergen Reactions  . Cephalexin Rash    Current Outpatient Prescriptions on File Prior to Visit  Medication Sig Dispense Refill  . AMITIZA 8 MCG capsule TAKE ONE CAPSULE BY MOUTH TWICE DAILY WITH MEALS 60 capsule 0  . aspirin 325 MG tablet Take 1 tablet (325 mg total) by mouth daily. 60 tablet 0  . aspirin-acetaminophen-caffeine (EXCEDRIN MIGRAINE) 250-250-65 MG tablet Take 2 tablets by mouth every 6 (six) hours as needed for headache or migraine.    Marland Kitchen atorvastatin (LIPITOR) 40 MG tablet TAKE ONE TABLET BY MOUTH ONCE DAILY AT 6PM 90 tablet 0  . cyclobenzaprine (FLEXERIL) 5 MG tablet TAKE ONE TABLET BY MOUTH THREE TIMES DAILY AS NEEDED  FOR  MUSCLE  SPASMS 30 tablet 0  . docusate sodium (COLACE) 100 MG capsule Take 100 mg by mouth 2 (two) times daily.    . hydrochlorothiazide (HYDRODIURIL) 25 MG tablet TAKE ONE TABLET BY MOUTH ONCE DAILY 90 tablet 1  . Linaclotide (LINZESS) 145 MCG CAPS capsule Take 1 capsule (145 mcg total) by mouth daily. 30 capsule 2  . ranitidine (ZANTAC) 150 MG tablet Take 1 tablet (150 mg total) by mouth at bedtime. 30 tablet 2  . traZODone (DESYREL) 50 MG tablet Take 0.5-1 tablets (25-50 mg total) by mouth at bedtime as needed for sleep. 30 tablet 3   No current facility-administered medications on file prior to visit.    BP 112/74 mmHg  Pulse 93  Temp(Src) 98.1 F (36.7 C) (Oral)  Ht 5\' 2"  (1.575 m)  Wt  158 lb 12.8 oz (72.031 kg)  BMI 29.04 kg/m2  SpO2 94%    Objective:   Physical Exam  Constitutional: She appears well-nourished.  HENT:  Right Ear: Tympanic membrane and ear canal normal.  Left Ear: Tympanic membrane and ear canal normal.  Nose: Right sinus exhibits no maxillary sinus tenderness and no frontal sinus tenderness. Left sinus exhibits no maxillary sinus tenderness and no frontal sinus tenderness.  Mouth/Throat: Oropharynx is clear and moist.  Eyes: Conjunctivae are normal.  Neck: Neck supple.  Cardiovascular: Normal rate and regular rhythm.   Pulmonary/Chest: Effort normal and breath sounds normal. She has no wheezes. She has no rales.  Lymphadenopathy:    She has no cervical adenopathy.  Skin: Skin is warm and dry.          Assessment & Plan:  URI:  Cough, fatigue, congestion x 5 days.  Some improvement with OTC treatment. Overall feeling improved.  Exam unremarkable. Clear lungs and normal HENT exam. Suspect viral involvement at this point and will treat with supportive measures. Continue Mucinex DM and Honey.  Return precautions provided.

## 2015-08-28 NOTE — Progress Notes (Signed)
Pre visit review using our clinic review tool, if applicable. No additional management support is needed unless otherwise documented below in the visit note. 

## 2015-09-01 ENCOUNTER — Telehealth: Payer: Self-pay | Admitting: Primary Care

## 2015-09-01 NOTE — Telephone Encounter (Signed)
Called and notified patient of Kate's comments. Patient verbalized understanding. Left paper in front office.

## 2015-09-01 NOTE — Telephone Encounter (Signed)
Please notify Ms. Smoker that I have completed her paper work which is ready for pick up. Please ensure she reviews the paperwork and notify me if she needs anything else. Paperwork placed in Amber [name].

## 2015-09-08 ENCOUNTER — Encounter: Payer: Self-pay | Admitting: Primary Care

## 2015-09-08 ENCOUNTER — Ambulatory Visit (INDEPENDENT_AMBULATORY_CARE_PROVIDER_SITE_OTHER): Payer: Medicaid Other | Admitting: Primary Care

## 2015-09-08 VITALS — BP 116/74 | HR 106 | Temp 98.1°F | Ht 62.0 in | Wt 159.8 lb

## 2015-09-08 DIAGNOSIS — G4700 Insomnia, unspecified: Secondary | ICD-10-CM | POA: Diagnosis not present

## 2015-09-08 DIAGNOSIS — I639 Cerebral infarction, unspecified: Secondary | ICD-10-CM

## 2015-09-08 DIAGNOSIS — E119 Type 2 diabetes mellitus without complications: Secondary | ICD-10-CM | POA: Insufficient documentation

## 2015-09-08 DIAGNOSIS — E785 Hyperlipidemia, unspecified: Secondary | ICD-10-CM | POA: Diagnosis not present

## 2015-09-08 DIAGNOSIS — R7303 Prediabetes: Secondary | ICD-10-CM | POA: Diagnosis not present

## 2015-09-08 DIAGNOSIS — I10 Essential (primary) hypertension: Secondary | ICD-10-CM

## 2015-09-08 LAB — LIPID PANEL
CHOLESTEROL: 193 mg/dL (ref 0–200)
HDL: 72.6 mg/dL (ref >39.00)
LDL Cholesterol: 105 mg/dL — ABNORMAL HIGH (ref 0–99)
NonHDL: 120.47
TRIGLYCERIDES: 77 mg/dL (ref 0.0–149.0)
Total CHOL/HDL Ratio: 3
VLDL: 15.4 mg/dL (ref 0.0–40.0)

## 2015-09-08 LAB — HEMOGLOBIN A1C: HEMOGLOBIN A1C: 6.4 % (ref 4.6–6.5)

## 2015-09-08 MED ORDER — TRAZODONE HCL 150 MG PO TABS: 150.0000 mg | ORAL_TABLET | Freq: Every evening | ORAL | Status: DC | PRN

## 2015-09-08 NOTE — Assessment & Plan Note (Signed)
Improved but continues to struggle with nightly waking. Will increase trazodone to 150 mg 1 hour prior to sleep. She is to notify me via My Chart with an update.

## 2015-09-08 NOTE — Progress Notes (Signed)
Pre visit review using our clinic review tool, if applicable. No additional management support is needed unless otherwise documented below in the visit note. 

## 2015-09-08 NOTE — Assessment & Plan Note (Signed)
Significant improvement to speech and sentence completion since October 2016. 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 Continue BP and Lipid control with current regimen. Will have her continue part time work until evaluation with neurologist in July. Exam unremarkable today. Follow up in 3 months.

## 2015-09-08 NOTE — Assessment & Plan Note (Addendum)
A1C of 6.2, 3 months ago. Due for repeat labs today. Discussed the importance of a healthy diet and regular exercise in order for weight loss and to reduce risk of other medical diseases.

## 2015-09-08 NOTE — Progress Notes (Signed)
Subjective:    Patient ID: Amber [name], female    DOB: 12/15/1970, 45 y.o.   MRN: [id number]  HPI  Amber [name] is a 45 year old female who presents today for follow up.  1) CVA: CVA diagnosed in October 2016. Ongoing treatment since with PT/OT, and neurology. Her greatest disability as a result of her stroke was expressive aphasia for which she's had gradual improvement overall. She started back part time at work on 07/07/15 but continues to experience challenges. She is not ready to return to work full time as she continues to struggle with her speech and communication. 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 She's working to improve this through frequent note taking and using post it notes for reminders. Her next follow up with neurology is July 17th. Denies any new numbness/tingling, dizziness, visual changes, headaches.  2) Insomnia: Currently managed on Trazodone 50 mg for which she has been taking 100 mg daily. She will sleep for 5-6 hours at a time on average. She's noticed slight improvement but continues to struggle with sleep.    3) Essential Hypertension: Currently managed on HCTZ 25 mg. BP today stable today in the clinic. She will have occasional headaches, but attributes to allergies. Denies dizziness, chest pain, shortness of breath.   4) Hyperlipidemia: Currently managed on Lipitor 40 mg since CVA. Last lipid panel close to goal. She is due for repeat lipids today.   Review of Systems  Respiratory: Negative for shortness of breath.   Cardiovascular: Negative for chest pain.  Neurological: Negative for dizziness, weakness, numbness and headaches.  Psychiatric/Behavioral: Positive for sleep disturbance. The patient is not nervous/anxious.        Past Medical History  Diagnosis Date  . Asthma   . Depression   . Headache   . Hyperlipidemia   . Hypertension   . UTI (lower urinary tract infection)   . IBS  (irritable bowel syndrome)   . Migraine   . Stroke [facility]) 03-13-2015    Social History   Social History  . Marital Status: Married    Spouse Name: N/A  . Number of Children: N/A  . Years of Education: N/A   Occupational History  . Not on file.   Social History Main Topics  . Smoking status: Never Smoker   . Smokeless tobacco: Never Used  . Alcohol Use: 0.0 oz/week    0 Standard drinks or equivalent per week     Comment: rarely  . Drug Use: No  . Sexual Activity: Not on file   Other Topics Concern  . Not on file   Social History Narrative   Originally from [place]   Family lives up here in [place]   Has one daughter.   Enjoys spending time shopping and spending time with family        Past Surgical History  Procedure Laterality Date  . Abdominal hysterectomy  10/2006  . Toe surgery      Left 2nd metatarsal  . Cesarean section  2005  . Ep implantable device N/A 03/17/2015    Procedure: Loop Recorder Insertion;  Surgeon: Will Jorja Loa, MD;  [facility];  Service: Cardiovascular;  Laterality: N/A;  . Tee without cardioversion N/A 03/17/2015    Procedure: TRANSESOPHAGEAL ECHOCARDIOGRAM (TEE);  Surgeon: Chilton Si, MD;  Location: [facility] ENDOSCOPY;  Service: Cardiovascular;  Laterality: N/A;    Family History  Problem Relation Age of Onset  . Arthritis  Father   . Diabetes Father   . Hypertension Father   . Hyperlipidemia Father   . Hyperlipidemia Mother   . Heart attack Maternal Grandfather   . Stroke Paternal Grandfather     Allergies  Allergen Reactions  . Cephalexin Rash    Current Outpatient Prescriptions on File Prior to Visit  Medication Sig Dispense Refill  . AMITIZA 8 MCG capsule TAKE ONE CAPSULE BY MOUTH TWICE DAILY WITH MEALS 60 capsule 0  . aspirin 325 MG tablet Take 1 tablet (325 mg total) by mouth daily. 60 tablet 0  . aspirin-acetaminophen-caffeine (EXCEDRIN MIGRAINE) 250-250-65 MG tablet Take 2 tablets by  mouth every 6 (six) hours as needed for headache or migraine.    Marland Kitchen atorvastatin (LIPITOR) 40 MG tablet TAKE ONE TABLET BY MOUTH ONCE DAILY AT 6PM 90 tablet 0  . cyclobenzaprine (FLEXERIL) 5 MG tablet TAKE ONE TABLET BY MOUTH THREE TIMES DAILY AS NEEDED FOR  MUSCLE  SPASMS 30 tablet 0  . docusate sodium (COLACE) 100 MG capsule Take 100 mg by mouth 2 (two) times daily.    . hydrochlorothiazide (HYDRODIURIL) 25 MG tablet TAKE ONE TABLET BY MOUTH ONCE DAILY 90 tablet 1  . Linaclotide (LINZESS) 145 MCG CAPS capsule Take 1 capsule (145 mcg total) by mouth daily. 30 capsule 2  . ranitidine (ZANTAC) 150 MG tablet Take 1 tablet (150 mg total) by mouth at bedtime. 30 tablet 2   No current facility-administered medications on file prior to visit.    Pulse 106  Ht 5\' 2"  (1.575 m)  Wt 159 lb 12.8 oz (72.485 kg)  BMI 29.22 kg/m2  SpO2 97%    Objective:   Physical Exam  Constitutional: She appears well-nourished.  Eyes: EOM are normal. Pupils are equal, round, and reactive to light.  Cardiovascular: Normal rate and regular rhythm.   Pulmonary/Chest: Effort normal and breath sounds normal.  Neurological: No cranial nerve deficit. Coordination normal.  Speech continues to improve. Does well with sentence completion. Not back to baseline.  Skin: Skin is warm and dry.  Psychiatric: She has a normal mood and affect.          Assessment & Plan:

## 2015-09-08 NOTE — Assessment & Plan Note (Signed)
Managed on atorvastatin 40 mg. Due for repeat lipids today. Discussed the importance of a healthy diet and regular exercise in order for weight loss and to reduce risk of other medical diseases.

## 2015-09-08 NOTE — Patient Instructions (Signed)
Complete lab work prior to leaving today. I will notify you of your results once received.   We've increased your Trazodone to 150 mg. Take 1 tablet by mouth 1 hour prior to bedtime. Please notify me if no improvement.  You look and sound great! I will complete your paperwork and call you once complete.  Follow up in 3 months for re-evaluation.  It was a pleasure to see you today!

## 2015-09-08 NOTE — Assessment & Plan Note (Signed)
Stable on HCTZ 25, continue.

## 2015-09-09 ENCOUNTER — Other Ambulatory Visit: Payer: Self-pay | Admitting: Primary Care

## 2015-09-09 DIAGNOSIS — Z79899 Other long term (current) drug therapy: Secondary | ICD-10-CM

## 2015-09-10 ENCOUNTER — Telehealth: Payer: Self-pay

## 2015-09-10 NOTE — Telephone Encounter (Signed)
Pt called to ck on refill of trazodone;spoke with christy at [facility] [address] and christy wanted to verify increase of trazodone to 150 mg; advised christy trazodone was increased to 150 mg on 09/08/15. Neysa Bonito will get ready for pick up. Pt voiced understanding and will pick up med.

## 2015-09-11 ENCOUNTER — Other Ambulatory Visit: Payer: Self-pay | Admitting: Primary Care

## 2015-09-11 ENCOUNTER — Other Ambulatory Visit (INDEPENDENT_AMBULATORY_CARE_PROVIDER_SITE_OTHER): Payer: Medicaid Other

## 2015-09-11 DIAGNOSIS — Z79899 Other long term (current) drug therapy: Secondary | ICD-10-CM | POA: Diagnosis not present

## 2015-09-11 DIAGNOSIS — E785 Hyperlipidemia, unspecified: Secondary | ICD-10-CM

## 2015-09-11 LAB — HEPATIC FUNCTION PANEL
ALBUMIN: 4 g/dL (ref 3.5–5.2)
ALT: 22 U/L (ref 0–35)
AST: 18 U/L (ref 0–37)
Alkaline Phosphatase: 69 U/L (ref 39–117)
BILIRUBIN TOTAL: 0.5 mg/dL (ref 0.2–1.2)
Bilirubin, Direct: 0.1 mg/dL (ref 0.0–0.3)
Total Protein: 7.5 g/dL (ref 6.0–8.3)

## 2015-09-11 MED ORDER — ATORVASTATIN CALCIUM 80 MG PO TABS: 80.0000 mg | ORAL_TABLET | Freq: Every evening | ORAL | Status: DC

## 2015-09-15 ENCOUNTER — Ambulatory Visit (INDEPENDENT_AMBULATORY_CARE_PROVIDER_SITE_OTHER): Payer: Medicaid Other | Admitting: *Deleted

## 2015-09-15 DIAGNOSIS — I639 Cerebral infarction, unspecified: Secondary | ICD-10-CM

## 2015-09-15 NOTE — Progress Notes (Signed)
Carelink Summary Report / Loop Recorder 

## 2015-09-16 ENCOUNTER — Other Ambulatory Visit: Payer: Self-pay | Admitting: Internal Medicine

## 2015-09-22 ENCOUNTER — Telehealth: Payer: Self-pay | Admitting: Primary Care

## 2015-09-22 NOTE — Telephone Encounter (Signed)
Pt dropped off [employer]  She said on page 4 the dr needs to sign beside kate's name  In dr bedsole's IN BOX

## 2015-09-23 NOTE — Telephone Encounter (Signed)
I notified patient form is ready to be picked up. °

## 2015-09-23 NOTE — Telephone Encounter (Signed)
Done in outbox

## 2015-10-06 ENCOUNTER — Encounter (INDEPENDENT_AMBULATORY_CARE_PROVIDER_SITE_OTHER): Payer: Self-pay

## 2015-10-06 ENCOUNTER — Encounter: Payer: Self-pay | Admitting: Primary Care

## 2015-10-06 ENCOUNTER — Ambulatory Visit (INDEPENDENT_AMBULATORY_CARE_PROVIDER_SITE_OTHER): Payer: Medicaid Other | Admitting: Primary Care

## 2015-10-06 ENCOUNTER — Ambulatory Visit (INDEPENDENT_AMBULATORY_CARE_PROVIDER_SITE_OTHER)
Admission: RE | Admit: 2015-10-06 | Discharge: 2015-10-06 | Disposition: A | Discharge status: Home / Self Care | Payer: Medicaid Other | Source: Ambulatory Visit | Attending: Primary Care | Admitting: Primary Care

## 2015-10-06 VITALS — BP 120/78 | HR 90 | Temp 97.9°F | Ht 62.0 in | Wt 158.4 lb

## 2015-10-06 DIAGNOSIS — M5441 Lumbago with sciatica, right side: Secondary | ICD-10-CM | POA: Diagnosis not present

## 2015-10-06 DIAGNOSIS — M5442 Lumbago with sciatica, left side: Secondary | ICD-10-CM

## 2015-10-06 DIAGNOSIS — M4180 Other forms of scoliosis, site unspecified: Secondary | ICD-10-CM | POA: Insufficient documentation

## 2015-10-06 LAB — POC URINALSYSI DIPSTICK (AUTOMATED)
BILIRUBIN UA: NEGATIVE
Blood, UA: NEGATIVE
Glucose, UA: NEGATIVE
KETONES UA: NEGATIVE
LEUKOCYTES UA: NEGATIVE
Nitrite, UA: NEGATIVE
Urobilinogen, UA: 0.2
pH, UA: 6

## 2015-10-06 NOTE — Patient Instructions (Signed)
Complete xray(s) prior to leaving today. I will notify you of your results once received.  Continue cyclobenzaprine as needed for muscle spasms.   Please notify me if the pain in your groin becomes worse as we could further investigate with an ultrasound.  Notify me if you develop fevers, nausea, vomiting, chills, diarrhea.  It was a pleasure to see you today!

## 2015-10-06 NOTE — Progress Notes (Signed)
Pre visit review using our clinic review tool, if applicable. No additional management support is needed unless otherwise documented below in the visit note. 

## 2015-10-06 NOTE — Progress Notes (Signed)
Subjective:    Patient ID: Amber [name], female    DOB: [birth year], 45 y.o.   MRN: [id number]  HPI  Amber [name] is a 45 year old female who presents today with multiple complaints.  1) Back Pain: Present for years, located to her lower back, more so on the right side. She has a history of scoliosis. She experiences back spasms several times weekly with improvement after taking Flexeril. Denies recent injury or trauma. She does experience numbness/tinlging with pain at night to her her bilateral lower extremities. She's not had recent imaging of her lower back in years.  2) Groin Pain: Located to bilateral groin and has been present intermittently for the past 3 weeks. She has a history of complete hysterectomy and cesarean section. She was told she had a lot of scar tissue as a result of both procedures. She's also noticed difficulty urinating 10 days ago but this has improved. Denies dysuria, vaginal discomfort, pelvic discomfort, vaginal discharge, fevers, diarrhea, nausea, vomiting, abdominal pain. She is not sexually active.   Review of Systems  Constitutional: Negative for fever and chills.  Gastrointestinal: Negative for abdominal pain.  Genitourinary: Positive for difficulty urinating. Negative for dysuria, frequency, hematuria and vaginal discharge.       Groin pain bilaterally  Musculoskeletal: Positive for back pain.  Neurological: Positive for numbness.       Past Medical History  Diagnosis Date  . Asthma   . Depression   . Headache   . Hyperlipidemia   . Hypertension   . UTI (lower urinary tract infection)   . IBS (irritable bowel syndrome)   . Migraine   . Stroke [facility]) 03-13-2015     Social History   Social History  . Marital Status: Married    Spouse Name: N/A  . Number of Children: N/A  . Years of Education: N/A   Occupational History  . Not on file.   Social History Main Topics  . Smoking status: Never Smoker   . Smokeless tobacco: Never Used  .  Alcohol Use: 0.0 oz/week    0 Standard drinks or equivalent per week     Comment: rarely  . Drug Use: No  . Sexual Activity: Not on file   Other Topics Concern  . Not on file   Social History Narrative   Originally from [place]   Family lives up here in [place]   Has one daughter.   Enjoys spending time shopping and spending time with family        Past Surgical History  Procedure Laterality Date  . Abdominal hysterectomy  10/2006  . Toe surgery      Left 2nd metatarsal  . Cesarean section  2005  . Ep implantable device N/A 03/17/2015    Procedure: Loop Recorder Insertion;  Surgeon: Will Jorja Loa, MD;  Location: [facility]VAS[facility] CV LAB;  Service: Cardiovascular;  Laterality: N/A;  . Tee without cardioversion N/A 03/17/2015    Procedure: TRANSESOPHAGEAL ECHOCARDIOGRAM (TEE);  Surgeon: Chilton Si, MD;  Location: [facility] ENDOSCOPY;  Service: Cardiovascular;  Laterality: N/A;    Family History  Problem Relation Age of Onset  . Arthritis Father   . Diabetes Father   . Hypertension Father   . Hyperlipidemia Father   . Hyperlipidemia Mother   . Heart attack Maternal Grandfather   . Stroke Paternal Grandfather     Allergies  Allergen Reactions  . Cephalexin Rash    Current Outpatient Prescriptions on File Prior to Visit  Medication Sig Dispense Refill  . AMITIZA 8 MCG capsule TAKE ONE CAPSULE BY MOUTH TWICE DAILY WITH MEALS 60 capsule 0  . aspirin 325 MG tablet Take 1 tablet (325 mg total) by mouth daily. 60 tablet 0  . aspirin-acetaminophen-caffeine (EXCEDRIN MIGRAINE) 250-250-65 MG tablet Take 2 tablets by mouth every 6 (six) hours as needed for headache or migraine.    Marland Kitchen atorvastatin (LIPITOR) 80 MG tablet Take 1 tablet (80 mg total) by mouth every evening. 30 tablet 3  . cyclobenzaprine (FLEXERIL) 5 MG tablet TAKE ONE TABLET BY MOUTH THREE TIMES DAILY AS NEEDED FOR  MUSCLE  SPASMS 30 tablet 0  . docusate sodium (COLACE) 100 MG capsule Take 100 mg by  mouth 2 (two) times daily.    . hydrochlorothiazide (HYDRODIURIL) 25 MG tablet TAKE ONE TABLET BY MOUTH ONCE DAILY 90 tablet 1  . Linaclotide (LINZESS) 145 MCG CAPS capsule Take 1 capsule (145 mcg total) by mouth daily. 30 capsule 2  . ranitidine (ZANTAC) 150 MG tablet TAKE ONE TABLET BY MOUTH AT BEDTIME 30 tablet 0  . traZODone (DESYREL) 150 MG tablet Take 1 tablet (150 mg total) by mouth at bedtime as needed for sleep. 30 tablet 3   No current facility-administered medications on file prior to visit.    BP 120/78 mmHg  Pulse 90  Temp(Src) 97.9 F (36.6 C) (Oral)  Ht  (1.575 m)  Wt 158 lb 6.4 oz (71.85 kg)  BMI 28.96 kg/m2  SpO2 97%    Objective:   Physical Exam  Constitutional: She appears well-nourished.  Cardiovascular: Normal rate and regular rhythm.   Pulmonary/Chest: Effort normal and breath sounds normal.  Abdominal: Soft. Bowel sounds are normal. There is no tenderness. There is no rebound and no tenderness at McBurney's point.  Musculoskeletal: Normal range of motion.  Non tender to lower back. Spinal column WNL.  Skin: Skin is warm and dry.          Assessment & Plan:  Groin Pain:  Present intermittently for the past 3 weeks. History of complete hysterectomy and c-section. UA today: Negative for leuks, nitrites, blood, glucose. Mildly tender upon palpation to left groin, no swelling. No vaginal bleeding, signs of renal stones, alarm signs. Will have her monitor symptoms and notify me if no improvement. May need to consider pelvic ultrasound if no improvement.

## 2015-10-06 NOTE — Assessment & Plan Note (Signed)
History of for years, more bothersome since CVA in October 2016. ROM today good. Given symptoms of radiculopathy will obtain xray for further evaluation. Continue flexeril PRN as she uses this sparingly.

## 2015-10-07 ENCOUNTER — Other Ambulatory Visit: Payer: Self-pay | Admitting: Primary Care

## 2015-10-07 DIAGNOSIS — M545 Low back pain: Secondary | ICD-10-CM

## 2015-10-13 ENCOUNTER — Ambulatory Visit (INDEPENDENT_AMBULATORY_CARE_PROVIDER_SITE_OTHER): Payer: Medicaid Other | Admitting: *Deleted

## 2015-10-13 DIAGNOSIS — I639 Cerebral infarction, unspecified: Secondary | ICD-10-CM

## 2015-10-14 NOTE — Progress Notes (Signed)
Carelink Summary Report / Loop Recorder 

## 2015-10-16 ENCOUNTER — Encounter: Payer: Self-pay | Admitting: Physical Therapy

## 2015-10-16 ENCOUNTER — Ambulatory Visit: Payer: Medicaid Other | Attending: Primary Care | Admitting: Physical Therapy

## 2015-10-16 DIAGNOSIS — M545 Low back pain: Secondary | ICD-10-CM | POA: Diagnosis not present

## 2015-10-16 NOTE — Therapy (Signed)
[facility] MAIN [facility] SERVICES [address] [place], [place], [address] Phone: [phone]   Fax:  [phone]  Physical Therapy Evaluation  Patient Details  Name: Amber [name] MRN: [id number] Date of Birth: [birth year] Referring Provider: Vernona Rieger NP  Encounter Date: 10/16/2015      PT End of Session - 10/16/15 1443    Visit Number 1   Number of Visits 1   Date for PT Re-Evaluation 10/16/15   Authorization Type medicaid   PT Start Time 1355   PT Stop Time 1440   PT Time Calculation (min) 45 min   Activity Tolerance Patient tolerated treatment well;No increased pain   Behavior During Therapy [facility] for tasks assessed/performed      Past Medical History  Diagnosis Date  . Asthma   . Headache   . Hypertension   . UTI (lower urinary tract infection)   . IBS (irritable bowel syndrome)   . Migraine   . Stroke ([facility]) 03-13-2015  . Depression     controlled  . Hyperlipidemia     controlled with medication    Past Surgical History  Procedure Laterality Date  . Abdominal hysterectomy  10/2006  . Toe surgery      Left 2nd metatarsal  . Cesarean section  2005  . Ep implantable device N/A 03/17/2015    Procedure: Loop Recorder Insertion;  Surgeon: Will Jorja Loa, MD;  [facility]: [facility]VAS[facility] CV LAB;  Service: Cardiovascular;  Laterality: N/A;  . Tee without cardioversion N/A 03/17/2015    Procedure: TRANSESOPHAGEAL ECHOCARDIOGRAM (TEE);  Surgeon: Chilton Si, MD;  Location: [facility] ENDOSCOPY;  Service: Cardiovascular;  Laterality: N/A;    There were no vitals filed for this visit.       Subjective Assessment - 10/16/15 1400    Subjective 45 yo Female reports chronic back pain and LE pain; She reports having episodes of muscle spasms. She also reports that she has been told in the past that she struggled with scoliosis. She reports that her back pain has been bothering her for about 1-2 years. She reports having pain in BLE  (R>L); She reports sleeping with body pillow and having increased tingling in legs in the morning. She reports that her tingling will go all the way down to the feet; She denies any recent falls; She is s/p CVA Oct 2016. She denies any change to bowel/bladder; She reports waking up at night about 2-3 x per week;    Pertinent History personal factors affecting rehab: chronicity of pain, work is 45-60 min away from home so patient has long sitting time when driving;    How long can you sit comfortably? 1 hour   How long can you stand comfortably? 1 hour   How long can you walk comfortably? some unsteadiness related to stroke, but no increase in pain;    Diagnostic tests X-rays of lumbar spine in May 2017 with no significant abnormalities, normal disc height and bone height;    Currently in Pain? No/denies            [facility] PT Assessment - 10/16/15 0001    Assessment   Medical Diagnosis Low back pain   Referring Provider Vernona Rieger NP   Onset Date/Surgical Date --  back pain 1-2 years   Hand Dominance Right   Next MD Visit July 2017   Prior Therapy Had 7 visits of PT from Nov-Jan for s/p CVA; She did get some exercises for back; reports some help but  wasn't as active then as she is now.    Precautions   Precautions None   Restrictions   Weight Bearing Restrictions No   Balance Screen   Has the patient fallen in the past 6 months No   Has the patient had a decrease in activity level because of a fear of falling?  No   Is the patient reluctant to leave their home because of a fear of falling?  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;   Does a lot of driving;    Leisure back to driving; working part time;    Copy Status Within Functional Limits for tasks assessed   Observation/Other Assessments   Observations denies any increase in pain with repeated flexion/extension of lumbar spine;    Sensation   Light Touch Appears Intact   Additional Comments reports tingling occasionally but intact today;    Coordination   Gross Motor Movements are Fluid and Coordinated Yes   Fine Motor Movements are Fluid and Coordinated Yes   Posture/Postural Control   Posture Comments demonstrates normal posture in sitting and standing; equal shoulder and hip height;    AROM   Overall AROM Comments BUE and BLE AROM is WFL; lumbar AROM is [facility]   Strength   Overall Strength Comments BLE gross strength is WFL;    Palpation   Spinal mobility hypomobility with pain noted at L4, otherwise unremarkable;   Palpation comment mild tenderness to palpation of lower lumbar paraspinals;    Special Tests    Special Tests Lumbar   Lumbar Tests FABER test;Slump Test;Prone Knee Bend Test;Straight Leg Raise   FABER test   findings Negative   Side --  bilaterally   Slump test   Findings Negative   Side --  bilaterally;   Prone Knee Bend Test   Findings Negative   Side --  bilaterally;   Straight Leg Raise   Findings Negative   Side  --  bilaterally;   Comment does have tightness in hamstrings (minimal)   Transfers   Comments independent in sit<>Stand transfer; no limitations;   Ambulation/Gait   Gait Comments ambulates independently with good reciprocal gait pattern;   Standardized Balance Assessment   10 Meter Walk 1.0 m/s without AD (community ambulator, low fall risk)       Educated patient in HEP with lumbar stretches to reduce back pain with sleeping. See patient instructions;                    PT Education - 10/16/15 1443    Education provided Yes    Education Details findings, recommendations, HEP   Person(s) Educated Patient   Methods Explanation;Verbal cues;Handout   Comprehension Verbalized understanding;Returned demonstration;Verbal cues required             PT Long Term Goals - 10/16/15 1447    PT LONG TERM GOAL #1   Title Patient will be independent in home exercise program to improve strength/mobility for better functional independence with ADLs.   Time 1   Period Days   Status Achieved  Plan - 10/16/15 1443    Clinical Impression Statement 45 yo Pleasant female reports chronic back pain over 1-2 years. Patient is s/p CVA in October 2016 and exhibits slower speech and slight impaired balance as deficits. She ambulates independently. She demonstrates full lumbar ROM without increase in pain. Patient does exhibit hypomobility with slight pain along L4 with PA mobs. She demonstrates good LE strength. Patient would benefit from skilled PT intervention to increase lumbar flexibility and instruct patient in correct body mechanics with ADLs to reduce pain. She has limited insurance visits and therefore is not able to afford additional skilled intervention. PT educated patient in [facility] which is a pro-bono clinic at [facility]. Patient will pursue the [facility] clinic for treatment at this time due to financial constraints.    Rehab Potential Good   Clinical Impairments Affecting Rehab Potential positive: good PLOF, young in age, minimal pain; Negative: chronicity of symptoms; Patient's clinical presentation is stable as her pain is limited to low back and is not severe;    PT Frequency One time visit   PT Treatment/Interventions Patient/family education;Therapeutic exercise   PT Home Exercise Plan initiated- see patient instructions;    Consulted and Agree with Plan of Care Patient      Patient will benefit from skilled therapeutic intervention in order to improve the following deficits and impairments:   Pain, Improper body mechanics, Hypomobility  Visit Diagnosis: Bilateral low back pain, with sciatica presence unspecified - Plan: PT plan of care cert/re-cert     Problem List Patient Active Problem List   Diagnosis Date Noted  . Low back pain 10/06/2015  . Prediabetes 09/08/2015  . Other fatigue 04/03/2015  . Constipation 03/31/2015  . Dysarthria   . Acute ischemic stroke (HCC) 03/14/2015  . Stroke (HCC)   . Insomnia 09/29/2014  . IBS (irritable bowel syndrome) 09/29/2014  . Hyperlipidemia 09/29/2014  . Essential hypertension 09/29/2014    Viraat Vanpatten PT, DPT 10/16/2015, 2:49 PM  [facility] MAIN [facility] SERVICES [address] [place], [place], [address] Phone: [phone]   Fax:  [phone]  Name: Krist Rufo MRN: [id number] Date of Birth: 1970/08/11

## 2015-10-16 NOTE — Patient Instructions (Signed)
Pelvic Tilt  Lying on back with knees bent, Flatten back by tightening stomach muscles and rocking hips back Hold for 5 sec, Repeat __10__ times per set. Do __1__ sets per session. Do __2__ sessions per day.  http://orth.exer.us/134    Copyright  VHI. All rights reserved. Knee to Chest (Flexion)   Pull knee toward chest. Feel stretch in lower back or buttock area. Breathing deeply, Hold __15__ seconds. Repeat with other knee. Repeat _2-3___ times. Do _2-3___ sessions per day.  http://gt2.exer.us/225   Copyright  VHI. All rights reserved.   Lower Trunk Rotation Stretch  Lying on back with knees bent, Keeping back flat and feet together, rotate knees side to side slowly and in pain free range of motion.  Hold _2___ seconds. Repeat for 1-2 minutes. Do __1__ sets per session. Do __2-3__ sessions per day.  http://orth.exer.us/122   Copyright  VHI. All rights reserved.   

## 2015-10-21 ENCOUNTER — Other Ambulatory Visit: Payer: Self-pay | Admitting: Internal Medicine

## 2015-10-22 ENCOUNTER — Encounter: Payer: Medicaid Other | Admitting: Physical Therapy

## 2015-10-24 LAB — CUP PACEART REMOTE DEVICE CHECK: Date Time Interrogation Session: 20170316190603

## 2015-10-27 LAB — CUP PACEART REMOTE DEVICE CHECK: MDC IDC SESS DTM: 20170415193539

## 2015-10-27 NOTE — Progress Notes (Signed)
Carelink summary report received. Battery status OK. Normal device function. No new symptom episodes, tachy episodes, brady, or pause episodes. No new AF episodes. Monthly summary reports and ROV/PRN 

## 2015-11-12 ENCOUNTER — Ambulatory Visit (INDEPENDENT_AMBULATORY_CARE_PROVIDER_SITE_OTHER): Payer: Medicaid Other | Admitting: *Deleted

## 2015-11-12 DIAGNOSIS — I639 Cerebral infarction, unspecified: Secondary | ICD-10-CM | POA: Diagnosis not present

## 2015-11-13 NOTE — Progress Notes (Signed)
Carelink Summary Report / Loop Recorder 

## 2015-11-14 ENCOUNTER — Telehealth: Payer: Self-pay

## 2015-11-14 NOTE — Telephone Encounter (Signed)
Pt left v/m; pt is not sleeping and feeling extra tired. Pt last seen insomnia 09/08/15. Trazodone 150 mg used to help with sleep but now not helping. Pt can go to sleep but wakes up around 3 AM and does not return to sleep. Pt request cb. [facility] [address]

## 2015-11-14 NOTE — Telephone Encounter (Signed)
Spoke to patient. She will go get the melatonin.

## 2015-11-14 NOTE — Telephone Encounter (Signed)
Please have her try taking melatonin as well as her trazodone at night.

## 2015-11-17 ENCOUNTER — Ambulatory Visit: Payer: Medicaid Other | Admitting: Family Medicine

## 2015-11-17 ENCOUNTER — Encounter: Payer: Self-pay | Admitting: Internal Medicine

## 2015-11-17 ENCOUNTER — Ambulatory Visit (INDEPENDENT_AMBULATORY_CARE_PROVIDER_SITE_OTHER): Payer: Medicaid Other | Admitting: Internal Medicine

## 2015-11-17 VITALS — BP 120/76 | HR 95 | Temp 98.8°F | Wt 158.0 lb

## 2015-11-17 DIAGNOSIS — M21611 Bunion of right foot: Secondary | ICD-10-CM

## 2015-11-17 DIAGNOSIS — M21612 Bunion of left foot: Secondary | ICD-10-CM

## 2015-11-17 DIAGNOSIS — B351 Tinea unguium: Secondary | ICD-10-CM | POA: Diagnosis not present

## 2015-11-17 DIAGNOSIS — R21 Rash and other nonspecific skin eruption: Secondary | ICD-10-CM | POA: Diagnosis not present

## 2015-11-17 DIAGNOSIS — M21619 Bunion of unspecified foot: Secondary | ICD-10-CM

## 2015-11-17 MED ORDER — TRIAMCINOLONE ACETONIDE 0.1 % EX CREA: 1.0000 "application " | TOPICAL_CREAM | Freq: Two times a day (BID) | CUTANEOUS | Status: DC

## 2015-11-17 NOTE — Progress Notes (Signed)
Subjective:    Patient ID: Amber [name], female    DOB: 08-17-1970, 45 y.o.   MRN: [id number]  HPI  Pt presents to the clinic today with c/o blisters on her left foot. She noticed this 8 weeks ago. The area is full of blisters. It is scaly and eventually scabs over and turns into dry skin. She is also concerned about bruising of her bilateral feet and discoloration of her toenails. She has tried OTC antifungal and Hydrocortisone cream without any relief. She wants a referral to see a podiatrist.  Review of Systems      Past Medical History  Diagnosis Date  . Asthma   . Headache   . Hypertension   . UTI (lower urinary tract infection)   . IBS (irritable bowel syndrome)   . Migraine   . Stroke (HCC) 03-13-2015  . Depression     controlled  . Hyperlipidemia     controlled with medication    Current Outpatient Prescriptions  Medication Sig Dispense Refill  . AMITIZA 8 MCG capsule TAKE ONE CAPSULE BY MOUTH TWICE DAILY WITH MEALS 60 capsule 1  . aspirin 325 MG tablet Take 1 tablet (325 mg total) by mouth daily. 60 tablet 0  . aspirin-acetaminophen-caffeine (EXCEDRIN MIGRAINE) 250-250-65 MG tablet Take 2 tablets by mouth every 6 (six) hours as needed for headache or migraine.    Marland Kitchen atorvastatin (LIPITOR) 80 MG tablet Take 1 tablet (80 mg total) by mouth every evening. 30 tablet 3  . cyclobenzaprine (FLEXERIL) 5 MG tablet TAKE ONE TABLET BY MOUTH THREE TIMES DAILY AS NEEDED FOR  MUSCLE  SPASMS 30 tablet 0  . docusate sodium (COLACE) 100 MG capsule Take 100 mg by mouth 2 (two) times daily.    . hydrochlorothiazide (HYDRODIURIL) 25 MG tablet TAKE ONE TABLET BY MOUTH ONCE DAILY 90 tablet 1  . Linaclotide (LINZESS) 145 MCG CAPS capsule Take 1 capsule (145 mcg total) by mouth daily. 30 capsule 2  . ranitidine (ZANTAC) 150 MG tablet TAKE ONE TABLET BY MOUTH AT BEDTIME 30 tablet 0  . traZODone (DESYREL) 150 MG tablet Take 1 tablet (150 mg total) by mouth at bedtime as needed for sleep. 30  tablet 3   No current facility-administered medications for this visit.    Allergies  Allergen Reactions  . Cephalexin Rash    Family History  Problem Relation Age of Onset  . Arthritis Father   . Diabetes Father   . Hypertension Father   . Hyperlipidemia Father   . Hyperlipidemia Mother   . Heart attack Maternal Grandfather   . Stroke Paternal Grandfather     Social History   Social History  . Marital Status: Married    Spouse Name: N/A  . Number of Children: N/A  . Years of Education: N/A   Occupational History  . Not on file.   Social History Main Topics  . Smoking status: Never Smoker   . Smokeless tobacco: Never Used  . Alcohol Use: 0.0 oz/week    0 Standard drinks or equivalent per week     Comment: rarely  . Drug Use: No  . Sexual Activity: Not on file   Other Topics Concern  . Not on file   Social History Narrative   Originally from [place]   Family lives up here in [place]   Has one daughter.   Enjoys spending time shopping and spending time with family         Constitutional: Denies fever, malaise,  fatigue, headache or abrupt weight changes.  Musculoskeletal: Denies decrease in range of motion, difficulty with gait, muscle pain or joint pain and swelling.  Skin: Pt reports blisters and bruising of her feet. Denies redness, or ulcercations.    No other specific complaints in a complete review of systems (except as listed in HPI above).  Objective:   Physical Exam BP 120/76 mmHg  Pulse 95  Temp(Src) 98.8 F (37.1 C) (Oral)  Wt 158 lb (71.668 kg)  SpO2 97% Wt Readings from Last 3 Encounters:  11/17/15 158 lb (71.668 kg)  10/06/15 158 lb 6.4 oz (71.85 kg)  09/08/15 159 lb 12.8 oz (72.485 kg)    General: Appears her stated age, in NAD. Skin: Warm, dry and intact. Clustered vesicular lesion on medial and dorsal sides of left foot. Bluish discoloration to bilateral feet due to poor circulation, not bruising.  Cardiovascular:  Normal rate and rhythm. S1,S2 noted.  Pedal pulses 2+ bilaterally. Cap refill 4-5 secs bilaterally. Pulmonary/Chest: Normal effort and positive vesicular breath sounds. No respiratory distress. No wheezes, rales or ronchi noted.   BMET    Component Value Date/Time   NA 137 03/16/2015 0408   K 4.3 03/16/2015 0408   CL 101 03/16/2015 0408   CO2 28 03/16/2015 0408   GLUCOSE 94 03/16/2015 0408   BUN 8 03/16/2015 0408   CREATININE 0.86 03/16/2015 0408   CALCIUM 9.1 03/16/2015 0408   GFRNONAA >60 03/16/2015 0408   GFRAA >60 03/16/2015 0408    Lipid Panel     Component Value Date/Time   CHOL 193 09/08/2015 1041   TRIG 77.0 09/08/2015 1041   HDL 72.60 09/08/2015 1041   CHOLHDL 3 09/08/2015 1041   VLDL 15.4 09/08/2015 1041   LDLCALC 105* 09/08/2015 1041    CBC    Component Value Date/Time   WBC 6.2 04/03/2015 1043   RBC 5.35* 04/03/2015 1043   HGB 15.0 04/03/2015 1043   HCT 46.2* 04/03/2015 1043   PLT 311.0 04/03/2015 1043   MCV 86.5 04/03/2015 1043   MCH 28.4 03/14/2015 0930   MCHC 32.5 04/03/2015 1043   RDW 13.1 04/03/2015 1043   LYMPHSABS 3.4 03/14/2015 0106   MONOABS 0.4 03/14/2015 0106   EOSABS 0.1 03/14/2015 0106   BASOSABS 0.0 03/14/2015 0106    Hgb A1C Lab Results  Component Value Date   HGBA1C 6.4 09/08/2015             Assessment & Plan:   Rash on feet:  eRx for Triamcinolone cream 0.1% BID Referral to podiatrist  Bunion, bilateral:  Non tender She wants further evalutation Referral to podiatrist  Toenail fungus:  Try Fungi Nail OTC Referral to podiatrist  RTC as needed or if symptoms persist or worsen

## 2015-11-17 NOTE — Progress Notes (Signed)
Pre visit review using our clinic review tool, if applicable. No additional management support is needed unless otherwise documented below in the visit note. 

## 2015-11-17 NOTE — Patient Instructions (Signed)

## 2015-11-18 LAB — CUP PACEART REMOTE DEVICE CHECK: Date Time Interrogation Session: 20170515200515

## 2015-11-20 ENCOUNTER — Other Ambulatory Visit: Payer: Self-pay

## 2015-11-20 NOTE — Telephone Encounter (Signed)
Pt left v/m requesting refill muscle relaxant; cyclobenzaprine last filled # 30 on 08/20/15; pt last seen 11/17/15. Pt has 3 mth f/u 12/15/15. Pt having back spasms and traveling more. Pt request cb when refilled.

## 2015-11-21 MED ORDER — CYCLOBENZAPRINE HCL 5 MG PO TABS: ORAL_TABLET | ORAL | Status: DC

## 2015-11-25 ENCOUNTER — Encounter: Payer: Self-pay | Admitting: Podiatry

## 2015-11-25 ENCOUNTER — Ambulatory Visit (INDEPENDENT_AMBULATORY_CARE_PROVIDER_SITE_OTHER): Payer: Medicaid Other | Admitting: Podiatry

## 2015-11-25 DIAGNOSIS — B353 Tinea pedis: Secondary | ICD-10-CM

## 2015-11-25 DIAGNOSIS — B351 Tinea unguium: Secondary | ICD-10-CM

## 2015-11-25 MED ORDER — KETOCONAZOLE 2 % EX CREA: 1.0000 "application " | TOPICAL_CREAM | Freq: Every day | CUTANEOUS | Status: DC

## 2015-11-25 NOTE — Progress Notes (Signed)
   Subjective:    Patient ID: Amber [name], female    DOB: 08-Nov-1970, 45 y.o.   MRN: [id number]  HPI  Patient presents the office today for concerns of a rash on her left foot. She recently just started triamcinolone cream by her primary care to that she's been on for the last 5 days. She states that she is unsure of his been helping quite yet. She states that the area becomes raw and itches and becomes red and the skin flakes off. She also is concerned that her toenails are becoming thick and discolored how there is no pain with the toenails and should to have treatment of toenail fungus. No swelling or redness from the toenails. No other complaints.   Review of Systems  All other systems reviewed and are negative.      Objective:   Physical Exam General: AAO x3, NAD  Dermatological: Nails previously dystrophic, discolored and hypertrophic. There is no tenderness the nails there is no surrounding redness or drainage. On the left foot there is areas on the medial and lateral after the foot of a dry, scaly, erythematous rash which is likely dermatitis versus any pedis. No open lesions or pre-ulcerative lesions.  Vascular: Dorsalis Pedis artery and Posterior Tibial artery pedal pulses are 2/4 bilateral with immedate capillary fill time. Pedal hair growth present.  There is no pain with calf compression, swelling, warmth, erythema.   Neruologic: Grossly intact via light touch bilateral. Vibratory intact via tuning fork bilateral. Protective threshold with Semmes Wienstein monofilament intact to all pedal sites bilateral.   Musculoskeletal: HAV present. No pain, crepitus, or limitation noted with foot and ankle range of motion bilateral  Gait: Unassisted, Nonantalgic.      Assessment & Plan:  Skin rash, tinea pedis versus dermatitis; possible onychomycosis -Treatment options discussed including all alternatives, risks, and complications -Etiology of symptoms were discussed -Will a  ketoconazole. She continues this in the morning and triamcinolone at night. -Nails were debrided and sent for culture. -Discussed treatment options for onychomycosis but we'll await the results the biopsy before proceeding with treatment. -Follow-up of the nail culture results are obtained or sooner if needed.  Ovid Curd, DPM

## 2015-12-10 LAB — CUP PACEART REMOTE DEVICE CHECK: MDC IDC SESS DTM: 20170614203530

## 2015-12-11 ENCOUNTER — Encounter: Payer: Self-pay | Admitting: Internal Medicine

## 2015-12-11 ENCOUNTER — Ambulatory Visit (INDEPENDENT_AMBULATORY_CARE_PROVIDER_SITE_OTHER): Payer: Medicaid Other | Admitting: Internal Medicine

## 2015-12-11 VITALS — BP 110/70 | HR 84 | Ht 62.0 in | Wt 162.1 lb

## 2015-12-11 DIAGNOSIS — K219 Gastro-esophageal reflux disease without esophagitis: Secondary | ICD-10-CM

## 2015-12-11 DIAGNOSIS — K581 Irritable bowel syndrome with constipation: Secondary | ICD-10-CM | POA: Diagnosis not present

## 2015-12-11 MED ORDER — PANTOPRAZOLE SODIUM 40 MG PO TBEC: 40.0000 mg | DELAYED_RELEASE_TABLET | Freq: Every day | ORAL | Status: DC

## 2015-12-11 MED ORDER — LINACLOTIDE 72 MCG PO CAPS: 72.0000 ug | ORAL_CAPSULE | Freq: Every day | ORAL | Status: DC

## 2015-12-11 NOTE — Patient Instructions (Signed)
We have sent the following medications to your pharmacy for you to pick up at your convenience: Linzess 72 mcg daily Protonix 40 mg daily  Please STOP Amitiza and ranitidine.  Follow up with Dr Rhea Belton in November 2017.  You will be due for a recall colonoscopy in 01/2020. We will send you a reminder in the mail when it gets closer to that time.  If you are age 43 or older, your body mass index should be between 23-30. Your Body mass index is 29.65 kg/(m^2). If this is out of the aforementioned range listed, please consider follow up with your Primary Care Provider.  If you are age 61 or younger, your body mass index should be between 19-25. Your Body mass index is 29.65 kg/(m^2). If this is out of the aformentioned range listed, please consider follow up with your Primary Care Provider.

## 2015-12-11 NOTE — Progress Notes (Signed)
Subjective:    Patient ID: Amber [name], female    DOB: 1971-04-12, 45 y.o.   MRN: [id number]  HPI  Amber [name] is a 45 year old female with a history of constipation, GERD who is seen in follow-up. She also has history of ischemic stroke in October 2016, hypertension and hyperlipidemia. She was initially seen in December 2016  Amber [name] returns for follow-up. She is here alone today. After her last visit she was started on Linzess 145 g daily for constipation. This resulted in diarrhea and urgency. She was then switched Amitiza 8 g twice a day which she has been using. She reports that she still struggles with intermittent trouble with constipation and incomplete evacuation.  She tries to eat a high-fiber diet including frequent spinach and CABG. This does make her fill gassy and bloated. She's had no blood in her stool or melena. She is having some reflux both during the day and at night occasionally waking up with sour brash as well as coughing. This is despite the addition of ranitidine 150 mg daily at bedtime. She denies new abdominal pain. No fevers or chills. Is now been 8 months since her stroke and she is doing well. She is working. She has neurology follow-up next week.   Review of Systems  as per history of present illness, otherwise negative  Current Medications, Allergies, Past Medical History, Past Surgical History, Family History and Social History were reviewed in [employer] record.     Objective:   Physical Exam BP 110/70 mmHg  Pulse 84  Ht  (1.575 m)  Wt 162 lb 2 oz (73.539 kg)  BMI 29.65 kg/m2 Constitutional: Well-developed and well-nourished. No distress. HEENT: Normocephalic and atraumatic. Conjunctivae are normal.  No scleral icterus. Neck: Neck supple. Trachea midline. Cardiovascular: Normal rate, regular rhythm and intact distal pulses. No M/R/G Pulmonary/chest: Effort normal and breath sounds normal. No wheezing, rales or  rhonchi. Abdominal: Soft, nontender, nondistended. Bowel sounds active throughout. There are no masses palpable. No hepatosplenomegaly. Extremities: no clubbing, cyanosis, or edema Lymphadenopathy: No cervical adenopathy noted. Neurological: Alert and oriented to person place and time. Mild stuttering with less dysarthria and prior Skin: Skin is warm and dry. Psychiatric: Normal mood and affect. Behavior is normal.  CBC    Component Value Date/Time   WBC 6.2 04/03/2015 1043   RBC 5.35* 04/03/2015 1043   HGB 15.0 04/03/2015 1043   HCT 46.2* 04/03/2015 1043   PLT 311.0 04/03/2015 1043   MCV 86.5 04/03/2015 1043   MCH 28.4 03/14/2015 0930   MCHC 32.5 04/03/2015 1043   RDW 13.1 04/03/2015 1043   LYMPHSABS 3.4 03/14/2015 0106   MONOABS 0.4 03/14/2015 0106   EOSABS 0.1 03/14/2015 0106   BASOSABS 0.0 03/14/2015 0106    CMP     Component Value Date/Time   NA 137 03/16/2015 0408   K 4.3 03/16/2015 0408   CL 101 03/16/2015 0408   CO2 28 03/16/2015 0408   GLUCOSE 94 03/16/2015 0408   BUN 8 03/16/2015 0408   CREATININE 0.86 03/16/2015 0408   CALCIUM 9.1 03/16/2015 0408   PROT 7.5 09/11/2015 1358   ALBUMIN 4.0 09/11/2015 1358   AST 18 09/11/2015 1358   ALT 22 09/11/2015 1358   ALKPHOS 69 09/11/2015 1358   BILITOT 0.5 09/11/2015 1358   GFRNONAA >60 03/16/2015 0408   GFRAA >60 03/16/2015 0408      Assessment & Plan:   45 year old female with a history of constipation, GERD who is  seen in follow-up.  1.  Chronic constipation --  Linzess at 145 g daily caused diarrhea and urgency. Amitiza 8 g twice a day has not been very effective for her. She would like to try something different. We discussed the new lower dose of Linzess and she would like to give this a try. Discontinue Amitiza and begin Linzess 72.5 g daily. She is asked to notify me if this results and fecal urgency or diarrhea.   2. GERD --  Amber [name] benefit in daily at bedtime ranitidine. We'll discontinue this and try  pantoprazole 40 mg daily. We discussed the risks, benefits and alternatives to PPI therapy and she wises to proceed with this trial.   3. CRC screening --  Colonoscopy from [place] in September 2011 was normal. I recommended screening 10 years from this exam which would be September 2021.   3-6 month follow-up, sooner if necessary 25 minutes spent with the patient today. Greater than 50% was spent in counseling and coordination of care with the patient

## 2015-12-12 ENCOUNTER — Ambulatory Visit (INDEPENDENT_AMBULATORY_CARE_PROVIDER_SITE_OTHER): Payer: Medicaid Other | Admitting: *Deleted

## 2015-12-12 DIAGNOSIS — I639 Cerebral infarction, unspecified: Secondary | ICD-10-CM

## 2015-12-15 ENCOUNTER — Encounter: Payer: Self-pay | Admitting: Primary Care

## 2015-12-15 ENCOUNTER — Ambulatory Visit: Payer: Self-pay | Admitting: Neurology

## 2015-12-15 ENCOUNTER — Ambulatory Visit (INDEPENDENT_AMBULATORY_CARE_PROVIDER_SITE_OTHER): Payer: Medicaid Other | Admitting: Primary Care

## 2015-12-15 VITALS — BP 114/68 | HR 84 | Temp 98.1°F | Ht 62.0 in | Wt 161.1 lb

## 2015-12-15 DIAGNOSIS — R7303 Prediabetes: Secondary | ICD-10-CM

## 2015-12-15 DIAGNOSIS — E785 Hyperlipidemia, unspecified: Secondary | ICD-10-CM

## 2015-12-15 DIAGNOSIS — R471 Dysarthria and anarthria: Secondary | ICD-10-CM

## 2015-12-15 DIAGNOSIS — I639 Cerebral infarction, unspecified: Secondary | ICD-10-CM

## 2015-12-15 DIAGNOSIS — I10 Essential (primary) hypertension: Secondary | ICD-10-CM | POA: Diagnosis not present

## 2015-12-15 DIAGNOSIS — K5900 Constipation, unspecified: Secondary | ICD-10-CM

## 2015-12-15 DIAGNOSIS — G4700 Insomnia, unspecified: Secondary | ICD-10-CM

## 2015-12-15 LAB — HEPATIC FUNCTION PANEL
ALT: 29 U/L (ref 0–35)
AST: 21 U/L (ref 0–37)
Albumin: 4.2 g/dL (ref 3.5–5.2)
Alkaline Phosphatase: 71 U/L (ref 39–117)
BILIRUBIN DIRECT: 0.1 mg/dL (ref 0.0–0.3)
TOTAL PROTEIN: 8 g/dL (ref 6.0–8.3)
Total Bilirubin: 0.6 mg/dL (ref 0.2–1.2)

## 2015-12-15 LAB — LIPID PANEL
CHOLESTEROL: 197 mg/dL (ref 0–200)
HDL: 71.4 mg/dL (ref >39.00)
LDL CALC: 112 mg/dL — AB (ref 0–99)
NonHDL: 125.39
TRIGLYCERIDES: 69 mg/dL (ref 0.0–149.0)
Total CHOL/HDL Ratio: 3
VLDL: 13.8 mg/dL (ref 0.0–40.0)

## 2015-12-15 LAB — HEMOGLOBIN A1C: Hgb A1c MFr Bld: 6 % (ref 4.6–6.5)

## 2015-12-15 NOTE — Assessment & Plan Note (Signed)
Improved since trazodone increased to 150 mg. Does take melatonin as needed in conjunction. Will continue to monitor.

## 2015-12-15 NOTE — Assessment & Plan Note (Signed)
Stable on HCTZ 25 mg. Continue current regimen.

## 2015-12-15 NOTE — Assessment & Plan Note (Signed)
Recently followed with GI who prescribed Linzess 72 mcg. she has not yet started but plans on doing this today as she was recently on vacation.

## 2015-12-15 NOTE — Assessment & Plan Note (Signed)
Much improvement and speaking in complete sentences with minor difficulty.

## 2015-12-15 NOTE — Progress Notes (Signed)
Carelink Summary Report / Loop Recorder 

## 2015-12-15 NOTE — Patient Instructions (Addendum)
You look great! Congratulations in your recovery!  Complete lab work prior to leaving today. I will notify you of your results once received.   Continue to work on improvements in your diet. Start slowly working up to regular exercise.  Follow up with your neurologist as scheduled.  Follow up in 6 months for re-evaluation.  It was a pleasure to see you today!

## 2015-12-15 NOTE — Progress Notes (Signed)
Pre visit review using our clinic review tool, if applicable. No additional management support is needed unless otherwise documented below in the visit note. 

## 2015-12-15 NOTE — Assessment & Plan Note (Signed)
Increase atorvastatin to 80 mg last visit, lipids pending today. LFTs pending today.

## 2015-12-15 NOTE — Assessment & Plan Note (Signed)
Continues to make improvement at each office visit including today. Significant improvement in expressive aphasia and is speaking in complete sentences with very minor difficulty. She is ready to go back to work, no provided allowing her to do so. Neuro exam unremarkable. She has follow-up scheduled with her neurologist for later this week. Continue strict control of lipids, work on weight loss through healthy diet and exercise.

## 2015-12-15 NOTE — Progress Notes (Signed)
Subjective:    Patient ID: Amber [name], female    DOB: [birth year], 45 y.o.   MRN: [id number]  HPI  Amber [name] is a 45 year old female who presents today for follow up.  1) CVA: Occurred in October 2016. Has been working with PT/ OT, and neurology. Last visit she had continued to make significant improvements towards recovery, but still struggled with expressive aphasia. 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 Her atorvastatin was increased to 80 mg last visit due to elevation in LDL.  Since her last visit in April 2017 she's experienced continued improvement with her speech. She's feeling more confident and as though she's doing well at work. She is working with her supervisor regarding her symptoms and has had support through her occupation. She is ready to return to a full time schedule for work.  She denies numbness, tingling, weakness, chest pain, headaches. She is due to see her neurologist Thursday this week.   2) Insomnia: Currently managed on Trazodone 150 mg and Melatonin at bedtime with improvement. She's noticed improvement in sleep since the increase in Trazodone from last visit.   3) Esophageal Reflux: Symptoms of esophageal burning, especially when laying down at night. She was evaluated by GI who provided her with a prescription for Protonix for esophageal reflux and Linzess for constipation. She will start her Protonix today.  4) Hyperlipidemia: Currently managed on Lipitor 80 mg that was increased from last visit due to LDL being above goal. Denies myalgias. Due for repeat lipids today.  Review of Systems  Eyes: Negative for visual disturbance.  Respiratory: Negative for shortness of breath.   Cardiovascular: Negative for chest pain.  Neurological: Negative for dizziness, weakness and headaches.  Psychiatric/Behavioral: Negative for sleep  disturbance.       Past Medical History  Diagnosis Date  . Asthma   . Headache   . Hypertension   . UTI (lower urinary tract infection)   . IBS (irritable bowel syndrome)   . Migraine   . Stroke (HCC) 03-13-2015  . Depression     controlled  . Hyperlipidemia     controlled with medication     Social History   Social History  . Marital Status: Married    Spouse Name: N/A  . Number of Children: N/A  . Years of Education: N/A   Occupational History  . Not on file.   Social History Main Topics  . Smoking status: Never Smoker   . Smokeless tobacco: Never Used  . Alcohol Use: 0.0 oz/week    0 Standard drinks or equivalent per week     Comment: rarely  . Drug Use: No  . Sexual Activity: Not on file   Other Topics Concern  . Not on file   Social History Narrative   Originally from [place]   Family lives up here in [place]   Has one daughter.   Enjoys spending time shopping and spending time with family        Past Surgical History  Procedure Laterality Date  . Abdominal hysterectomy  10/2006  . Toe surgery      Left 2nd metatarsal  . Cesarean section  2005  . Ep implantable device N/A 03/17/2015    Procedure: Loop Recorder Insertion;  Surgeon: Will Jorja Loa, MD;  Location: [facility] INVASIVE CV LAB;  Service: Cardiovascular;  Laterality: N/A;  . Tee without cardioversion N/A 03/17/2015    Procedure: TRANSESOPHAGEAL ECHOCARDIOGRAM (TEE);  Surgeon: Chilton Si, MD;  Location: [facility] ENDOSCOPY;  Service: Cardiovascular;  Laterality: N/A;    Family History  Problem Relation Age of Onset  . Arthritis Father   . Diabetes Father   . Hypertension Father   . Hyperlipidemia Father   . Hyperlipidemia Mother   . Heart attack Maternal Grandfather   . Stroke Paternal Grandfather     Allergies  Allergen Reactions  . Cephalexin Rash    Current Outpatient Prescriptions on File Prior to Visit  Medication Sig Dispense Refill  . aspirin 325 MG tablet  Take 1 tablet (325 mg total) by mouth daily. 60 tablet 0  . aspirin-acetaminophen-caffeine (EXCEDRIN MIGRAINE) 250-250-65 MG tablet Take 2 tablets by mouth every 6 (six) hours as needed for headache or migraine.    Marland Kitchen atorvastatin (LIPITOR) 80 MG tablet Take 1 tablet (80 mg total) by mouth every evening. 30 tablet 3  . cyclobenzaprine (FLEXERIL) 5 MG tablet Take 1 tablet by mouth once to twice daily as needed for spasms. 30 tablet 1  . docusate sodium (COLACE) 100 MG capsule Take 100 mg by mouth 2 (two) times daily.    . hydrochlorothiazide (HYDRODIURIL) 25 MG tablet TAKE ONE TABLET BY MOUTH ONCE DAILY 90 tablet 1  . ketoconazole (NIZORAL) 2 % cream Apply 1 application topically daily. 60 g 2  . traZODone (DESYREL) 150 MG tablet Take 1 tablet (150 mg total) by mouth at bedtime as needed for sleep. 30 tablet 3  . triamcinolone cream (KENALOG) 0.1 % Apply 1 application topically 2 (two) times daily. 30 g 0  . linaclotide (LINZESS) 72 MCG capsule Take 1 capsule (72 mcg total) by mouth daily before breakfast. (Patient not taking: Reported on 12/15/2015) 30 capsule 3  . pantoprazole (PROTONIX) 40 MG tablet Take 1 tablet (40 mg total) by mouth daily. (Patient not taking: Reported on 12/15/2015) 30 tablet 3   No current facility-administered medications on file prior to visit.    BP 114/68 mmHg  Pulse 84  Temp(Src) 98.1 F (36.7 C) (Oral)  Ht 5\' 2"  (1.575 m)  Wt 161 lb 1.9 oz (73.084 kg)  BMI 29.46 kg/m2  SpO2 98%    Objective:   Physical Exam  Constitutional: She is oriented to person, place, and time. She appears well-nourished.  Eyes: EOM are normal. Pupils are equal, round, and reactive to light.  Cardiovascular: Normal rate and regular rhythm.   Pulmonary/Chest: Effort normal and breath sounds normal.  Musculoskeletal:  Good strength to bilateral upper and lower extremities.  Neurological: She is alert and oriented to person, place, and time. No cranial nerve deficit.  Skin: Skin is  warm and dry.          Assessment & Plan:

## 2015-12-15 NOTE — Assessment & Plan Note (Signed)
A1c is 6.4, 3 months ago. Repeat A1c pending today. Stressed importance of healthy diet and regular exercise.

## 2015-12-17 ENCOUNTER — Other Ambulatory Visit: Payer: Self-pay | Admitting: Primary Care

## 2015-12-17 DIAGNOSIS — E785 Hyperlipidemia, unspecified: Secondary | ICD-10-CM

## 2015-12-17 MED ORDER — ROSUVASTATIN CALCIUM 10 MG PO TABS: 10.0000 mg | ORAL_TABLET | Freq: Every day | ORAL | Status: DC

## 2015-12-18 ENCOUNTER — Encounter: Payer: Self-pay | Admitting: Neurology

## 2015-12-18 ENCOUNTER — Encounter: Payer: Self-pay | Admitting: *Deleted

## 2015-12-18 ENCOUNTER — Ambulatory Visit (INDEPENDENT_AMBULATORY_CARE_PROVIDER_SITE_OTHER): Payer: Medicaid Other | Admitting: Neurology

## 2015-12-18 VITALS — BP 107/77 | HR 72 | Ht 62.0 in | Wt 159.6 lb

## 2015-12-18 DIAGNOSIS — I69920 Aphasia following unspecified cerebrovascular disease: Secondary | ICD-10-CM

## 2015-12-18 DIAGNOSIS — I69320 Aphasia following cerebral infarction: Secondary | ICD-10-CM

## 2015-12-18 NOTE — Patient Instructions (Signed)
I had a long d/w patient and her daughter about her remote stroke, risk for recurrent stroke/TIAs, personally independently reviewed imaging studies and stroke evaluation results and answered questions.Continue aspirin 325 mg daily  for secondary stroke prevention and maintain strict control of hypertension with blood pressure goal below 130/90, diabetes with hemoglobin A1c goal below 6.5% and lipids with LDL cholesterol goal below 70 mg/dL. I also advised the patient to eat a healthy diet with plenty of whole grains, cereals, fruits and vegetables, exercise regularly and maintain ideal body weight Followup in the future with stroke nurse practitioner in one year or call earlier if necessary.

## 2015-12-18 NOTE — Progress Notes (Signed)
[facility] [address] [place]. [place] [address] [phone]       OFFICE FOLLOW-UP NOTE  [name] Date of Birth:  03-14-1971 Medical Record Number:  [id number]   HPI:  06/10/2015 visit : 45 year old African-American lady seen today for first office follow-up visit following admission for stroke in October 2016.Amber [name] is an 45 y.o. female without significant PMH who reports that this evening while doing work at home she developed pain on the left side of her face. It was so severe that she was unable to continue her work. She went to The mirror and noted that her face did not look right. When she went to talk to her cousin her speech would not come out normally. EMS was called at that time and the patient was brought in as a code stroke. Initial NIHSS of 3. BP elevated. Date last known well: Date: 03/13/2015 Time last known well: Time: 23:00 tPA Given: No: Minimal symptoms, patient not consenting MRankin: 0  CT head of the brain on admission was unremarkable a CT angiogram of the head and neck revealed no evidence of dissection, vasculitis or stenosis. MRI scan of the brain showed a small acute ischemic nonhemorrhagic infarct involving the posterior left frontal cortical gray matter and white matter. There was also small remote age right parietal cortical infarct with possibly tiny additional remote left cerebellar infarcts as well. Mild changes of chronic small vessel disease. Transthoracic echo showed ejection fraction of 50%. Transesophageal echocardiogram showed no evidence of cardiac source of embolism or PFO or clot. Transcranial Doppler bubble study was negative. Hypercoagulable panel labs were negative. Vasculitic labs were also normal. LDL cholesterol was elevated at 170. Hemoglobin A1c was 6.1. Patient had loop recorder implanted and so for atrial fibrillation has not yet been found. She states she's done well since discharge. She is  currently doing outpatient physical and speech therapy. She still left some word finding difficulties and stuttering but mainly when she tries to talk fast or he is excited. She is still on disability but plans to start work next month part-time. She is been eating healthy and plans treatment on the [facility] and start exercising. Her blood pressure is well controlled and today it is 113/63. She is tolerating aspirin well without bleeding or bruising as well as Lipitor without side effects. Update 12/18/2015 ; She returns for follow-up after last visit 6 months ago. She is accompanied by her daughter. She continues to do well without recurrent stroke or TIA symptoms. She has only occasional stuttering and word finding difficulties when she gets excited and talks too fast. She is tolerating aspirin well without bruising or bleeding. She states her blood pressure is well controlled and today it is 107/72 in office. She states she is on Crestor 10 mg and tolerating it well but her last lipid profile was still not satisfactory. She plans to discuss alternative treatment options with her primary physician soon. She has not yet been diagnosed with A. fib and has a loop recorder. She admits she wants to lose some weight but has not been very particular about a healthy diet or exercising regularly ROS:   14 system review of systems is positive for   constipation, diarrhea, insomnia, frequency of urination, migraines and all other systems negative  PMH:  Past Medical History  Diagnosis Date  . Asthma   . Hypertension   . UTI (lower urinary tract infection)   . IBS (irritable bowel syndrome)   .  Stroke (HCC) 03-13-2015  . Depression     controlled  . Hyperlipidemia     controlled with medication    Social History:  Social History   Social History  . Marital Status: Married    Spouse Name: N/A  . Number of Children: N/A  . Years of Education: N/A   Occupational History  . Not on file.   Social History  Main Topics  . Smoking status: Never Smoker   . Smokeless tobacco: Never Used  . Alcohol Use: 0.0 oz/week    0 Standard drinks or equivalent per week     Comment: rarely  . Drug Use: No  . Sexual Activity: Not on file   Other Topics Concern  . Not on file   Social History Narrative   Originally from [place]   Family lives up here in [place]   Has one daughter.   Enjoys spending time shopping and spending time with family        Medications:   Current Outpatient Prescriptions on File Prior to Visit  Medication Sig Dispense Refill  . aspirin 325 MG tablet Take 1 tablet (325 mg total) by mouth daily. 60 tablet 0  . aspirin-acetaminophen-caffeine (EXCEDRIN MIGRAINE) 250-250-65 MG tablet Take 2 tablets by mouth every 6 (six) hours as needed for headache or migraine.    . cyclobenzaprine (FLEXERIL) 5 MG tablet Take 1 tablet by mouth once to twice daily as needed for spasms. 30 tablet 1  . docusate sodium (COLACE) 100 MG capsule Take 100 mg by mouth 2 (two) times daily.    . hydrochlorothiazide (HYDRODIURIL) 25 MG tablet TAKE ONE TABLET BY MOUTH ONCE DAILY 90 tablet 1  . ketoconazole (NIZORAL) 2 % cream Apply 1 application topically daily. 60 g 2  . linaclotide (LINZESS) 72 MCG capsule Take 1 capsule (72 mcg total) by mouth daily before breakfast. 30 capsule 3  . pantoprazole (PROTONIX) 40 MG tablet Take 1 tablet (40 mg total) by mouth daily. 30 tablet 3  . rosuvastatin (CRESTOR) 10 MG tablet Take 1 tablet (10 mg total) by mouth daily. 90 tablet 0  . traZODone (DESYREL) 150 MG tablet Take 1 tablet (150 mg total) by mouth at bedtime as needed for sleep. 30 tablet 3  . triamcinolone cream (KENALOG) 0.1 % Apply 1 application topically 2 (two) times daily. 30 g 0   No current facility-administered medications on file prior to visit.    Allergies:   Allergies  Allergen Reactions  . Cephalexin Rash    Physical Exam General: well developed, well nourished young  African-American lady, seated, in no evident distress Head: head normocephalic and atraumatic.  Neck: supple with no carotid or supraclavicular bruits Cardiovascular: regular rate and rhythm, no murmurs Musculoskeletal: no deformity Skin:  no rash/petichiae Vascular:  Normal pulses all extremities Filed Vitals:   12/18/15 1655  BP: 107/77  Pulse: 72   Neurologic Exam Mental Status: Awake and fully alert. Oriented to place and time. Recent and remote memory intact. Attention span, concentration and fund of knowledge appropriate. Mood and affect appropriate. Speech mostly fluent with occasional word finding difficulties and disfluency. No paraphasic errors. Good comprehension and repetition. Cranial Nerves: Fundoscopic exam not done. Pupils equal, briskly reactive to light. Extraocular movements full without nystagmus. Visual fields full to confrontation. Hearing intact. Facial sensation intact. Face, tongue, palate moves normally and symmetrically.  Motor: Normal bulk and tone. Normal strength in all tested extremity muscles. Sensory.: intact to touch ,pinprick .position and vibratory  sensation.  Coordination: Rapid alternating movements normal in all extremities. Finger-to-nose and heel-to-shin performed accurately bilaterally. Gait and Station: Arises from chair without difficulty. Stance is normal. Gait demonstrates normal stride length and balance . Able to heel, toe and tandem walk without difficulty.  Reflexes: 1+ and symmetric. Toes downgoing.       ASSESSMENT: 45 year old lady with the embolic left MCA branch infarct in October 2016 of cryptogenic etiology with vascular risk factors of only hyperlipidemia and mild obesity. Extensive evaluation for vasculitis, hypercoagulable panel and cardiac source of embolism has been negative     PLAN: I had a long d/w patient and her daughter about her remote stroke, risk for recurrent stroke/TIAs, personally independently reviewed imaging  studies and stroke evaluation results and answered questions.Continue aspirin 325 mg daily  for secondary stroke prevention and maintain strict control of hypertension with blood pressure goal below 130/90, diabetes with hemoglobin A1c goal below 6.5% and lipids with LDL cholesterol goal below 70 mg/dL. I also advised the patient to eat a healthy diet with plenty of whole grains, cereals, fruits and vegetables, exercise regularly and maintain ideal body weight. Greater than 50% time during this 25 minute visit was spent on counseling and coordination of care. Followup in the future with stroke nurse practitioner in one year or call earlier if necessary.  Delia Heady, MD  Note: This document was prepared with digital dictation and possible smart phrase technology. Any transcriptional errors that result from this process are unintentional

## 2015-12-23 ENCOUNTER — Ambulatory Visit: Payer: Medicaid Other | Admitting: Podiatry

## 2015-12-25 NOTE — Progress Notes (Signed)
If LDL remains above goal despite max dose of crestor consider new PCS9 inhibitors- Praluent or Repatha

## 2015-12-30 ENCOUNTER — Encounter: Payer: Self-pay | Admitting: Podiatry

## 2015-12-30 ENCOUNTER — Ambulatory Visit (INDEPENDENT_AMBULATORY_CARE_PROVIDER_SITE_OTHER): Payer: Medicaid Other | Admitting: Podiatry

## 2015-12-30 DIAGNOSIS — B351 Tinea unguium: Secondary | ICD-10-CM | POA: Diagnosis not present

## 2015-12-30 DIAGNOSIS — L603 Nail dystrophy: Secondary | ICD-10-CM

## 2015-12-30 MED ORDER — TERBINAFINE HCL 250 MG PO TABS: 250.0000 mg | ORAL_TABLET | Freq: Every day | ORAL | 0 refills | Status: DC

## 2015-12-30 MED ORDER — DESOXIMETASONE 0.25 % EX CREA: 1.0000 "application " | TOPICAL_CREAM | Freq: Two times a day (BID) | CUTANEOUS | 0 refills | Status: DC

## 2015-12-30 NOTE — Patient Instructions (Signed)

## 2016-01-05 NOTE — Progress Notes (Signed)
Subjective: 45 off and will presents the also discussed nail culture results. She states that overall her toenails are about the same. Denies any drainage or redness or any swelling. Denies any systemic complaints such as fevers, chills, nausea, vomiting. No acute changes since last appointment, and no other complaints at this time.   Objective: AAO x3, NAD DP/PT pulses palpable bilaterally, CRT less than 3 seconds Nails continue be hypertrophic, dystrophic, discolored. There is no tenderness in nails and his motion or redness or drainage. There is also faint dry, scaly, erythematous rash which appears more for tinea pedis today. No open sores and no drainage. No edema, erythema, increase in warmth to bilateral lower extremities.  No open lesions or pre-ulcerative lesions.  No pain with calf compression, swelling, warmth, erythema  Assessment: Onychomycosis, tinea pedis  Plan: -All treatment options discussed with the patient including all alternatives, risks, complications.  -After discussion the nail culture results of the patient and she is also pursued oral Lamisil. Discussed risks and side effects the medication she wishes to proceed. She was ordered 30 days of Lamisil today as well as ordered CBC with differential and LFT. -Follow-up in 4 weeks or sooner if any issues are to arise. -Patient encouraged to call the office with any questions, concerns, change in symptoms.   Ovid Curd, DPM

## 2016-01-06 LAB — CUP PACEART REMOTE DEVICE CHECK: Date Time Interrogation Session: 20170714210743

## 2016-01-12 ENCOUNTER — Ambulatory Visit (INDEPENDENT_AMBULATORY_CARE_PROVIDER_SITE_OTHER): Payer: Medicaid Other | Admitting: *Deleted

## 2016-01-12 DIAGNOSIS — I639 Cerebral infarction, unspecified: Secondary | ICD-10-CM

## 2016-01-12 NOTE — Progress Notes (Signed)
Carelink Summary Report / Loop Recorder 

## 2016-01-13 ENCOUNTER — Telehealth: Payer: Self-pay | Admitting: *Deleted

## 2016-01-13 NOTE — Telephone Encounter (Signed)
Pt states she is not able to go to [facility] due to a overdue bill, and would like to take the orders to [facility].  I told pt I would have the lab orders available for pick up in the [place] office to take to [facility]. I told pt if [facility] did there own testing there or sent to [facility], that would be okay, but if they only drew the blood and sent to [facility] that would not work.  Pt states she will pick up the lab orders tomorrow and she understood.

## 2016-01-19 ENCOUNTER — Ambulatory Visit (INDEPENDENT_AMBULATORY_CARE_PROVIDER_SITE_OTHER): Payer: Medicaid Other | Admitting: Primary Care

## 2016-01-19 ENCOUNTER — Encounter: Payer: Self-pay | Admitting: Primary Care

## 2016-01-19 VITALS — BP 122/82 | HR 77 | Temp 98.1°F | Ht 62.0 in | Wt 163.1 lb

## 2016-01-19 DIAGNOSIS — R490 Dysphonia: Secondary | ICD-10-CM

## 2016-01-19 DIAGNOSIS — Z79899 Other long term (current) drug therapy: Secondary | ICD-10-CM | POA: Diagnosis not present

## 2016-01-19 NOTE — Addendum Note (Signed)
Addended by: Alvina Chou on: 01/19/2016 03:54 PM   Modules accepted: Orders

## 2016-01-19 NOTE — Progress Notes (Signed)
Subjective:    Patient ID: Amber [name], female    DOB: 12/22/1970, 45 y.o.   MRN: [id number]  HPI  Amber [name] is a 45 year old female who presents today with a chief complaint of voice hoairsness. This has been present for the past 4 days. She recently buried her Uncle over the weekend in [place] and has been under stress with traveling and coordinating a funeral. Denies sore throat, fevers, chills. She does experience a mild cough. She has been tired over the past several days given the recent events of her weekend. Overall she's feeling well.  Review of Systems  Constitutional: Negative for chills and fever.  HENT: Positive for postnasal drip. Negative for congestion, sinus pressure, sore throat and trouble swallowing.   Respiratory: Positive for cough.        Past Medical History:  Diagnosis Date  . Asthma   . Depression    controlled  . Hyperlipidemia    controlled with medication  . Hypertension   . IBS (irritable bowel syndrome)   . Stroke (HCC) 03-13-2015  . UTI (lower urinary tract infection)      Social History   Social History  . Marital status: Married    Spouse name: N/A  . Number of children: N/A  . Years of education: N/A   Occupational History  . Not on file.   Social History Main Topics  . Smoking status: Never Smoker  . Smokeless tobacco: Never Used  . Alcohol use 0.0 oz/week     Comment: rarely  . Drug use: No  . Sexual activity: Not on file   Other Topics Concern  . Not on file   Social History Narrative   Originally from [place]   Family lives up here in [place]   Has one daughter.   Enjoys spending time shopping and spending time with family        Past Surgical History:  Procedure Laterality Date  . ABDOMINAL HYSTERECTOMY  10/2006  . CESAREAN SECTION  2005  . EP IMPLANTABLE DEVICE N/A 03/17/2015   Procedure: Loop Recorder Insertion;  Surgeon: Will Jorja Loa, MD;  Location: [facility] INVASIVE CV LAB;   Service: Cardiovascular;  Laterality: N/A;  . TEE WITHOUT CARDIOVERSION N/A 03/17/2015   Procedure: TRANSESOPHAGEAL ECHOCARDIOGRAM (TEE);  Surgeon: Chilton Si, MD;  Location: [facility] ENDOSCOPY;  Service: Cardiovascular;  Laterality: N/A;  . TOE SURGERY     Left 2nd metatarsal    Family History  Problem Relation Age of Onset  . Arthritis Father   . Diabetes Father   . Hypertension Father   . Hyperlipidemia Father   . Hyperlipidemia Mother   . Heart attack Maternal Grandfather   . Stroke Paternal Grandfather     Allergies  Allergen Reactions  . Cephalexin Rash    Current Outpatient Prescriptions on File Prior to Visit  Medication Sig Dispense Refill  . aspirin 325 MG tablet Take 1 tablet (325 mg total) by mouth daily. 60 tablet 0  . aspirin-acetaminophen-caffeine (EXCEDRIN MIGRAINE) 250-250-65 MG tablet Take 2 tablets by mouth every 6 (six) hours as needed for headache or migraine.    . cyclobenzaprine (FLEXERIL) 5 MG tablet Take 1 tablet by mouth once to twice daily as needed for spasms. 30 tablet 1  . desoximetasone (TOPICORT) 0.25 % cream Apply 1 application topically 2 (two) times daily. 30 g 0  . docusate sodium (COLACE) 100 MG capsule Take 100 mg by mouth 2 (two) times daily.    Marland Kitchen  hydrochlorothiazide (HYDRODIURIL) 25 MG tablet TAKE ONE TABLET BY MOUTH ONCE DAILY 90 tablet 1  . ketoconazole (NIZORAL) 2 % cream Apply 1 application topically daily. 60 g 2  . linaclotide (LINZESS) 72 MCG capsule Take 1 capsule (72 mcg total) by mouth daily before breakfast. 30 capsule 3  . pantoprazole (PROTONIX) 40 MG tablet Take 1 tablet (40 mg total) by mouth daily. 30 tablet 3  . rosuvastatin (CRESTOR) 10 MG tablet Take 1 tablet (10 mg total) by mouth daily. 90 tablet 0  . terbinafine (LAMISIL) 250 MG tablet Take 1 tablet (250 mg total) by mouth daily. 30 tablet 0  . traZODone (DESYREL) 150 MG tablet Take 1 tablet (150 mg total) by mouth at bedtime as needed for sleep. 30 tablet 3  .  triamcinolone cream (KENALOG) 0.1 % Apply 1 application topically 2 (two) times daily. 30 g 0   No current facility-administered medications on file prior to visit.     BP 122/82   Pulse 77   Temp 98.1 F (36.7 C) (Oral)   Ht 5\' 2"  (1.575 m)   Wt 163 lb 1.9 oz (74 kg)   SpO2 97%   BMI 29.84 kg/m    Objective:   Physical Exam  Constitutional: She appears well-nourished.  HENT:  Right Ear: Tympanic membrane and ear canal normal.  Left Ear: Tympanic membrane and ear canal normal.  Nose: Right sinus exhibits no maxillary sinus tenderness and no frontal sinus tenderness. Left sinus exhibits no maxillary sinus tenderness and no frontal sinus tenderness.  Mouth/Throat: Oropharynx is clear and moist.  Eyes: Conjunctivae are normal.  Neck: Neck supple.  Cardiovascular: Normal rate and regular rhythm.   Pulmonary/Chest: Effort normal and breath sounds normal. She has no wheezes. She has no rales.  Lymphadenopathy:    She has no cervical adenopathy.  Skin: Skin is warm and dry.          Assessment & Plan:  Hoarseness:  Present for 4 days. Recently traveled to [place] and under stress for a funeral. Overall feeling well. Exam today without evidence of acute bacterial process. Does not appear acutely ill. Vitals stable. Will treat with conservative measures such as voice rest, ensuring a full night's sleep, good nutrition, proper hydration. Already managed on PPI. Discussed return precautions.  Morrie Sheldonlark,Chasen Mendell Kendal, NP

## 2016-01-19 NOTE — Progress Notes (Signed)
Pre visit review using our clinic review tool, if applicable. No additional management support is needed unless otherwise documented below in the visit note. 

## 2016-01-19 NOTE — Patient Instructions (Addendum)
Your symptoms should improve with rest, proper hydration, and good nutrition. Your throat does not appear infectious.   Ensure you are staying hydrated with water.   Please notify me if you develop persistent fevers of 101, start coughing up green mucous, notice increased fatigue or weakness, or feel worse after 1 week of onset of symptoms.   Schedule a lab only appointment in mid October for re-evaluation of your cholesterol.   It was a pleasure to see you today!   Hoarseness Hoarseness is any abnormal change in your voice.Hoarseness can make it difficult to speak. Your voice may sound raspy, breathy, or strained. Hoarseness is caused by a problem with the vocal cords. The vocal cords are two bands of tissue inside your voice box (larynx). When you speak, your vocal cords move back and forth to create sound. The surfaces of your vocal cords need to be smooth for your voice to sound clear. Swelling or lumps on the vocal cords can cause hoarseness. Common causes of vocal cord problems include:  Upper airway infection.  A long-term cough.  Straining or overusing your voice.  Smoking.  Allergies.  Vocal cord growths.  Stomach acids that flow up from your stomach and irritate your vocal cords (gastroesophageal reflux). HOME CARE INSTRUCTIONS Watch your condition for any changes. To ease any discomfort that you feel:  Rest your voice. Do not whisper. Whispering can cause muscle strain.  Do not speak in a loud or harsh voice that makes your hoarseness worse.  Do not use any tobacco products, including cigarettes, chewing tobacco, or electronic cigarettes. If you need help quitting, ask your health care provider.  Avoid secondhand smoke.  Do not eat foods that give you heartburn. Heartburn can make gastroesophageal reflux worse.  Do not drink coffee.  Do not drink alcohol.  Drink enough fluids to keep your urine clear or pale yellow.  Use a humidifier if the air in your home  is dry. SEEK MEDICAL CARE IF:  You have hoarseness that lasts longer than 3 weeks.  You almost lose or completelylose your voice for longer than 3 days.  You have pain when you swallow or try to talk.  You feel a lump in your neck. SEEK IMMEDIATE MEDICAL CARE IF:  You have trouble swallowing.  You feel as though you are choking when you swallow.  You cough up blood or vomit blood.  You have trouble breathing.   This information is not intended to replace advice given to you by your health care provider. Make sure you discuss any questions you have with your health care provider.   Document Released: 04/30/2005 Document Revised: 10/01/2014 Document Reviewed: 05/08/2014 Elsevier Interactive Patient Education [employer]

## 2016-01-20 LAB — CBC WITH DIFFERENTIAL/PLATELET
BASOS PCT: 0.6 % (ref 0.0–3.0)
Basophils Absolute: 0 10*3/uL (ref 0.0–0.1)
EOS PCT: 3 % (ref 0.0–5.0)
Eosinophils Absolute: 0.1 10*3/uL (ref 0.0–0.7)
HEMATOCRIT: 42.8 % (ref 36.0–46.0)
HEMOGLOBIN: 14.5 g/dL (ref 12.0–15.0)
LYMPHS PCT: 48.2 % — AB (ref 12.0–46.0)
Lymphs Abs: 2.2 10*3/uL (ref 0.7–4.0)
MCHC: 33.9 g/dL (ref 30.0–36.0)
MCV: 86.6 fl (ref 78.0–100.0)
MONOS PCT: 5.5 % (ref 3.0–12.0)
Monocytes Absolute: 0.2 10*3/uL (ref 0.1–1.0)
NEUTROS ABS: 1.9 10*3/uL (ref 1.4–7.7)
Neutrophils Relative %: 42.7 % — ABNORMAL LOW (ref 43.0–77.0)
PLATELETS: 248 10*3/uL (ref 150.0–400.0)
RBC: 4.94 Mil/uL (ref 3.87–5.11)
RDW: 13.9 % (ref 11.5–15.5)
WBC: 4.5 10*3/uL (ref 4.0–10.5)

## 2016-01-20 LAB — HEPATIC FUNCTION PANEL
ALK PHOS: 70 U/L (ref 39–117)
ALT: 14 U/L (ref 0–35)
AST: 16 U/L (ref 0–37)
Albumin: 4.3 g/dL (ref 3.5–5.2)
BILIRUBIN DIRECT: 0.1 mg/dL (ref 0.0–0.3)
BILIRUBIN TOTAL: 0.4 mg/dL (ref 0.2–1.2)
Total Protein: 7.8 g/dL (ref 6.0–8.3)

## 2016-01-23 NOTE — Telephone Encounter (Addendum)
-----   Message from Vivi Barrack, DPM sent at 01/23/2016 10:13 AM EDT ----- Blood work ok. Continue lamisil. Please let her know. Thanks. Left message informing pt of Dr.Wagoner's orders.

## 2016-01-26 ENCOUNTER — Telehealth: Payer: Self-pay | Admitting: Podiatry

## 2016-01-26 NOTE — Telephone Encounter (Signed)
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

## 2016-01-26 NOTE — Telephone Encounter (Signed)
Pt called states she lost the Lamisil rx that was with her visit paperwork.  I reviewed the Medication orders and the Lamisil had been escribed and I told the pt she could pick it up at the pharmacy.  Pt states she has memory loss and forgets sometimes.

## 2016-01-28 ENCOUNTER — Telehealth: Payer: Self-pay | Admitting: Primary Care

## 2016-01-28 DIAGNOSIS — Z111 Encounter for screening for respiratory tuberculosis: Secondary | ICD-10-CM

## 2016-01-28 NOTE — Telephone Encounter (Signed)
Spoken to patient and asked her if she wanted the results for TB that was done January. Patient stated she need on done in the last 6 months with her new job.  Patient stated can she come in for blood work for the TB?  Please advise.

## 2016-01-28 NOTE — Telephone Encounter (Signed)
Message left for patient to return my call.  

## 2016-01-28 NOTE — Telephone Encounter (Signed)
Issue wanting the QuantiFERON blood draw for TB testing? Where she wanting the TB skin test? Either are fine with me, please schedule her for either a lab or nurse and let me know which one she chooses please.

## 2016-01-28 NOTE — Telephone Encounter (Signed)
Pt needs copy of tb test. Please advise when ready for her to pick up

## 2016-01-28 NOTE — Telephone Encounter (Signed)
Pt returned your call - please call back at [phone] Thanks

## 2016-01-28 NOTE — Telephone Encounter (Signed)
Noted and ordered

## 2016-01-28 NOTE — Telephone Encounter (Signed)
Spoken to patient she would like to have the Quantiferon blood drawn and lab appointment is schedule for 01/29/2016.

## 2016-01-29 ENCOUNTER — Other Ambulatory Visit (INDEPENDENT_AMBULATORY_CARE_PROVIDER_SITE_OTHER): Payer: Medicaid Other

## 2016-01-29 DIAGNOSIS — Z111 Encounter for screening for respiratory tuberculosis: Secondary | ICD-10-CM | POA: Diagnosis not present

## 2016-01-31 LAB — QUANTIFERON TB GOLD ASSAY (BLOOD)
INTERFERON GAMMA RELEASE ASSAY: NEGATIVE
Mitogen-Nil: 4.36 IU/mL
QUANTIFERON NIL VALUE: 0.03 [IU]/mL
Quantiferon Tb Ag Minus Nil Value: <0 IU/mL

## 2016-02-03 ENCOUNTER — Telehealth: Payer: Self-pay | Admitting: Primary Care

## 2016-02-03 NOTE — Telephone Encounter (Signed)
Patient is requesting a copy of her tb test.  Patient needs to have it by Thursday.  Please call patient when it's ready for pick up.

## 2016-02-04 ENCOUNTER — Encounter: Payer: Self-pay | Admitting: *Deleted

## 2016-02-04 NOTE — Telephone Encounter (Signed)
Letter with lab results is ready for pick up. Left message for patient to call back.  Left letter in the front office for patient to pick up.

## 2016-02-05 NOTE — Telephone Encounter (Signed)
Spoken to patient and notified her that letter with lab results is in the front office for her to pick up.

## 2016-02-07 LAB — CUP PACEART REMOTE DEVICE CHECK: MDC IDC SESS DTM: 20170813210659

## 2016-02-10 ENCOUNTER — Ambulatory Visit (INDEPENDENT_AMBULATORY_CARE_PROVIDER_SITE_OTHER): Payer: Medicaid Other | Admitting: *Deleted

## 2016-02-10 DIAGNOSIS — I639 Cerebral infarction, unspecified: Secondary | ICD-10-CM

## 2016-02-11 NOTE — Progress Notes (Signed)
Carelink Summary Report / Loop Recorder 

## 2016-02-17 ENCOUNTER — Telehealth: Payer: Self-pay | Admitting: Internal Medicine

## 2016-02-17 NOTE — Telephone Encounter (Signed)
Pt is taking linzess 72 mcg and states she had constipation last week and this week she has diarrhea. Pt is taking Protonix 40mg  daily and is complaining of coughing, choking at night and that it burns when she swallows. Please advise.

## 2016-02-17 NOTE — Telephone Encounter (Signed)
Pt states that she has been having issues with constipation alternating with diarrhea for the past 2 weeks. Also states at night she is waking up coughing, feeling like she is choking and it burns when she swallows. Asked pt about the meds she is taking and she cannot name the meds. Pt will call back to let us know exactly what she is taking.

## 2016-02-17 NOTE — Telephone Encounter (Signed)
Patient states that she is taking linzess and pantoprazole

## 2016-02-17 NOTE — Telephone Encounter (Signed)
Please ensure she is taking pantoprazole 40 mg daily 30 minutes before breakfast without missing doses Add ranitidine 150 mg in the evening near bedtime for nocturnal or breakthrough heartburn symptoms Continue Linzess 72 g daily and add Benefiber 1-2 tablespoons daily which should help even out bowel movements. She should have follow-up per last office note

## 2016-02-18 MED ORDER — RANITIDINE HCL 150 MG PO CAPS: 150.0000 mg | ORAL_CAPSULE | Freq: Every evening | ORAL | 3 refills | Status: DC

## 2016-02-18 NOTE — Telephone Encounter (Signed)
Spoke with pt and she is aware, script sent to pharmacy. 

## 2016-03-05 ENCOUNTER — Other Ambulatory Visit: Payer: Self-pay | Admitting: Primary Care

## 2016-03-05 DIAGNOSIS — E7849 Other hyperlipidemia: Secondary | ICD-10-CM

## 2016-03-06 LAB — CUP PACEART REMOTE DEVICE CHECK: MDC IDC SESS DTM: 20170912210715

## 2016-03-06 NOTE — Progress Notes (Signed)
Carelink summary report received. Battery status OK. Normal device function. No new symptom episodes, tachy episodes, brady, or pause episodes. No new AF episodes. Monthly summary reports and ROV/PRN 

## 2016-03-11 ENCOUNTER — Ambulatory Visit (INDEPENDENT_AMBULATORY_CARE_PROVIDER_SITE_OTHER): Payer: Medicaid Other | Admitting: *Deleted

## 2016-03-11 DIAGNOSIS — I639 Cerebral infarction, unspecified: Secondary | ICD-10-CM | POA: Diagnosis not present

## 2016-03-12 NOTE — Progress Notes (Signed)
Carelink Summary Report / Loop Recorder 

## 2016-03-15 ENCOUNTER — Other Ambulatory Visit (INDEPENDENT_AMBULATORY_CARE_PROVIDER_SITE_OTHER): Payer: Medicaid Other

## 2016-03-15 ENCOUNTER — Ambulatory Visit (INDEPENDENT_AMBULATORY_CARE_PROVIDER_SITE_OTHER): Payer: Medicaid Other

## 2016-03-15 ENCOUNTER — Other Ambulatory Visit: Payer: Medicaid Other

## 2016-03-15 DIAGNOSIS — E7849 Other hyperlipidemia: Secondary | ICD-10-CM

## 2016-03-15 DIAGNOSIS — Z23 Encounter for immunization: Secondary | ICD-10-CM

## 2016-03-15 DIAGNOSIS — E784 Other hyperlipidemia: Secondary | ICD-10-CM

## 2016-03-15 LAB — LIPID PANEL
CHOLESTEROL: 204 mg/dL — AB (ref 0–200)
HDL: 81.6 mg/dL (ref >39.00)
LDL CALC: 108 mg/dL — AB (ref 0–99)
NonHDL: 122.69
TRIGLYCERIDES: 74 mg/dL (ref 0.0–149.0)
Total CHOL/HDL Ratio: 3
VLDL: 14.8 mg/dL (ref 0.0–40.0)

## 2016-03-16 ENCOUNTER — Other Ambulatory Visit: Payer: Self-pay | Admitting: Primary Care

## 2016-03-16 DIAGNOSIS — E785 Hyperlipidemia, unspecified: Secondary | ICD-10-CM

## 2016-03-16 MED ORDER — ROSUVASTATIN CALCIUM 20 MG PO TABS: 20.0000 mg | ORAL_TABLET | Freq: Every day | ORAL | 1 refills | Status: DC

## 2016-04-01 ENCOUNTER — Other Ambulatory Visit: Payer: Self-pay | Admitting: Primary Care

## 2016-04-01 DIAGNOSIS — G4700 Insomnia, unspecified: Secondary | ICD-10-CM

## 2016-04-11 LAB — CUP PACEART REMOTE DEVICE CHECK
Date Time Interrogation Session: 20171012213656
Implantable Pulse Generator Implant Date: 20161017

## 2016-04-11 NOTE — Progress Notes (Signed)
Carelink summary report received. Battery status OK. Normal device function. No new symptom episodes, tachy episodes, brady, or pause episodes. No new AF episodes. Monthly summary reports and ROV/PRN 

## 2016-04-12 ENCOUNTER — Ambulatory Visit (INDEPENDENT_AMBULATORY_CARE_PROVIDER_SITE_OTHER): Payer: Medicaid Other | Admitting: *Deleted

## 2016-04-12 DIAGNOSIS — I639 Cerebral infarction, unspecified: Secondary | ICD-10-CM

## 2016-04-13 ENCOUNTER — Other Ambulatory Visit: Payer: Self-pay | Admitting: Internal Medicine

## 2016-04-13 NOTE — Progress Notes (Signed)
Carelink Summary Report / Loop Recorder 

## 2016-04-27 ENCOUNTER — Other Ambulatory Visit: Payer: Self-pay | Admitting: Internal Medicine

## 2016-04-27 ENCOUNTER — Ambulatory Visit (INDEPENDENT_AMBULATORY_CARE_PROVIDER_SITE_OTHER): Payer: Medicaid Other | Admitting: Primary Care

## 2016-04-27 ENCOUNTER — Encounter: Payer: Self-pay | Admitting: Primary Care

## 2016-04-27 VITALS — BP 118/86 | HR 76 | Temp 98.4°F | Ht 62.0 in | Wt 172.8 lb

## 2016-04-27 DIAGNOSIS — J069 Acute upper respiratory infection, unspecified: Secondary | ICD-10-CM

## 2016-04-27 MED ORDER — BENZONATATE 200 MG PO CAPS: 200.0000 mg | ORAL_CAPSULE | Freq: Three times a day (TID) | ORAL | 0 refills | Status: DC | PRN

## 2016-04-27 NOTE — Patient Instructions (Addendum)
Your symptoms are representative of a viral illness which will resolve on its own over time. Our goal is to treat your symptoms in order to aid your body in the healing process and to make you more comfortable.   Cough/Congestion: Try taking Mucinex DM. This will help loosen up the mucous in your chest. Ensure you take this medication with a full glass of water.  You may take Benzonatate capsules for cough. Take 1 capsule by mouth three times daily as needed for cough.  Please notify me if you develop persistent fevers of 101, start coughing up green mucous, notice increased fatigue or weakness, or feel worse after 1 week of onset of symptoms.   Increase consumption of water intake and rest.  Resume your Vitamin D 1000 units daily as this may help with fatigue.  It was a pleasure to see you today!   Upper Respiratory Infection, Adult Most upper respiratory infections (URIs) are a viral infection of the air passages leading to the lungs. A URI affects the nose, throat, and upper air passages. The most common type of URI is nasopharyngitis and is typically referred to as "the common cold." URIs run their course and usually go away on their own. Most of the time, a URI does not require medical attention, but sometimes a bacterial infection in the upper airways can follow a viral infection. This is called a secondary infection. Sinus and middle ear infections are common types of secondary upper respiratory infections. Bacterial pneumonia can also complicate a URI. A URI can worsen asthma and chronic obstructive pulmonary disease (COPD). Sometimes, these complications can require emergency medical care and may be life threatening. What are the causes? Almost all URIs are caused by viruses. A virus is a type of germ and can spread from one person to another. What increases the risk? You may be at risk for a URI if:  You smoke.  You have chronic heart or lung disease.  You have a weakened  defense (immune) system.  You are very young or very old.  You have nasal allergies or asthma.  You work in crowded or poorly ventilated areas.  You work in health care facilities or schools. What are the signs or symptoms? Symptoms typically develop 2-3 days after you come in contact with a cold virus. Most viral URIs last 7-10 days. However, viral URIs from the influenza virus (flu virus) can last 14-18 days and are typically more severe. Symptoms may include:  Runny or stuffy (congested) nose.  Sneezing.  Cough.  Sore throat.  Headache.  Fatigue.  Fever.  Loss of appetite.  Pain in your forehead, behind your eyes, and over your cheekbones (sinus pain).  Muscle aches. How is this diagnosed? Your health care provider may diagnose a URI by:  Physical exam.  Tests to check that your symptoms are not due to another condition such as:  Strep throat.  Sinusitis.  Pneumonia.  Asthma. How is this treated? A URI goes away on its own with time. It cannot be cured with medicines, but medicines may be prescribed or recommended to relieve symptoms. Medicines may help:  Reduce your fever.  Reduce your cough.  Relieve nasal congestion. Follow these instructions at home:  Take medicines only as directed by your health care provider.  Gargle warm saltwater or take cough drops to comfort your throat as directed by your health care provider.  Use a warm mist humidifier or inhale steam from a shower to increase air moisture.  This may make it easier to breathe.  Drink enough fluid to keep your urine clear or pale yellow.  Eat soups and other clear broths and maintain good nutrition.  Rest as needed.  Return to work when your temperature has returned to normal or as your health care provider advises. You may need to stay home longer to avoid infecting others. You can also use a face mask and careful hand washing to prevent spread of the virus.  Increase the usage of  your inhaler if you have asthma.  Do not use any tobacco products, including cigarettes, chewing tobacco, or electronic cigarettes. If you need help quitting, ask your health care provider. How is this prevented? The best way to protect yourself from getting a cold is to practice good hygiene.  Avoid oral or hand contact with people with cold symptoms.  Wash your hands often if contact occurs. There is no clear evidence that vitamin C, vitamin E, echinacea, or exercise reduces the chance of developing a cold. However, it is always recommended to get plenty of rest, exercise, and practice good nutrition. Contact a health care provider if:  You are getting worse rather than better.  Your symptoms are not controlled by medicine.  You have chills.  You have worsening shortness of breath.  You have brown or red mucus.  You have yellow or brown nasal discharge.  You have pain in your face, especially when you bend forward.  You have a fever.  You have swollen neck glands.  You have pain while swallowing.  You have white areas in the back of your throat. Get help right away if:  You have severe or persistent:  Headache.  Ear pain.  Sinus pain.  Chest pain.  You have chronic lung disease and any of the following:  Wheezing.  Prolonged cough.  Coughing up blood.  A change in your usual mucus.  You have a stiff neck.  You have changes in your:  Vision.  Hearing.  Thinking.  Mood. This information is not intended to replace advice given to you by your health care provider. Make sure you discuss any questions you have with your health care provider. Document Released: 11/10/2000 Document Revised: 01/18/2016 Document Reviewed: 08/22/2013 Elsevier Interactive Patient Education  2017 [employer]

## 2016-04-27 NOTE — Progress Notes (Signed)
Pre visit review using our clinic review tool, if applicable. No additional management support is needed unless otherwise documented below in the visit note. 
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
## 2016-04-28 ENCOUNTER — Telehealth: Payer: Self-pay

## 2016-04-28 NOTE — Telephone Encounter (Signed)
Spoken and notified patient of Kate's comments. Patient stated that she would like a prescription for cough medication with codeine.

## 2016-04-28 NOTE — Telephone Encounter (Signed)
She can try taking Delsym OTC or I can print out a prescription for cough medication with codeine. The codeine will make her drowsy, so make sure she knows. Let me know what she decides.

## 2016-04-28 NOTE — Telephone Encounter (Signed)
Pt left v/m that ins will not cover benzonatate. Pt request different cough med to [facility] [address].Please advise.

## 2016-04-29 ENCOUNTER — Other Ambulatory Visit: Payer: Self-pay | Admitting: Primary Care

## 2016-04-29 DIAGNOSIS — J069 Acute upper respiratory infection, unspecified: Secondary | ICD-10-CM

## 2016-04-29 MED ORDER — HYDROCOD POLST-CPM POLST ER 10-8 MG/5ML PO SUER: 5.0000 mL | Freq: Every evening | ORAL | 0 refills | Status: DC | PRN

## 2016-04-29 NOTE — Telephone Encounter (Signed)
Noted. Rx ready for pick up.  [name], would you mind calling patient and telling her that her Rx is ready for pickup? Johny Drilling is gone for the day.

## 2016-04-29 NOTE — Telephone Encounter (Signed)
Pt notified as instructed that rx at front desk for pick up. Pt voiced understanding.

## 2016-05-03 ENCOUNTER — Ambulatory Visit (INDEPENDENT_AMBULATORY_CARE_PROVIDER_SITE_OTHER): Payer: Medicaid Other | Admitting: Primary Care

## 2016-05-03 VITALS — BP 118/80 | HR 101 | Temp 98.7°F | Ht 62.0 in | Wt 170.8 lb

## 2016-05-03 DIAGNOSIS — J029 Acute pharyngitis, unspecified: Secondary | ICD-10-CM

## 2016-05-03 LAB — POCT RAPID STREP A (OFFICE): RAPID STREP A SCREEN: NEGATIVE

## 2016-05-03 MED ORDER — AZITHROMYCIN 250 MG PO TABS: ORAL_TABLET | ORAL | 0 refills | Status: DC

## 2016-05-03 NOTE — Progress Notes (Signed)
Pre visit review using our clinic review tool, if applicable. No additional management support is needed unless otherwise documented below in the visit note. 

## 2016-05-03 NOTE — Patient Instructions (Signed)
Start Azithromycin antibiotics. Take 2 tablets by mouth today, then 1 tablet daily for 4 additional days.  Continue iburpofen for throat pain and inflammation. You can also try warm salt gargles, chloraseptic spray, throat lozenges.  It was a pleasure to see you today!
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
## 2016-05-05 LAB — CULTURE, GROUP A STREP

## 2016-05-06 ENCOUNTER — Ambulatory Visit (INDEPENDENT_AMBULATORY_CARE_PROVIDER_SITE_OTHER): Payer: Medicaid Other | Admitting: Podiatry

## 2016-05-06 DIAGNOSIS — L603 Nail dystrophy: Secondary | ICD-10-CM | POA: Diagnosis not present

## 2016-05-06 DIAGNOSIS — Z79899 Other long term (current) drug therapy: Secondary | ICD-10-CM | POA: Diagnosis not present

## 2016-05-06 MED ORDER — TERBINAFINE HCL 250 MG PO TABS: 250.0000 mg | ORAL_TABLET | Freq: Every day | ORAL | 2 refills | Status: DC

## 2016-05-06 MED ORDER — TERBINAFINE HCL 250 MG PO TABS: 250.0000 mg | ORAL_TABLET | Freq: Every day | ORAL | 0 refills | Status: DC

## 2016-05-06 NOTE — Patient Instructions (Signed)
Terbinafine oral granules What is this medicine? TERBINAFINE (TER bin a feen) is an antifungal medicine. It is used to treat certain kinds of fungal or yeast infections. COMMON BRAND NAME(S): Lamisil What should I tell my health care provider before I take this medicine? They need to know if you have any of these conditions: -drink alcoholic beverages -kidney disease -liver disease -an unusual or allergic reaction to Terbinafine, other medicines, foods, dyes, or preservatives -pregnant or trying to get pregnant -breast-feeding How should I use this medicine? Take this medicine by mouth. Follow the directions on the prescription label. Hold packet with cut line on top. Shake packet gently to settle contents. Tear packet open along cut line, or use scissors to cut across line. Carefully pour the entire contents of packet onto a spoonful of a soft food, such as pudding or other soft, non-acidic food such as mashed potatoes (do NOT use applesauce or a fruit-based food). If two packets are required for each dose, you may either sprinkle the content of both packets on one spoonful of non-acidic food, or sprinkle the contents of both packets on two spoonfuls of non-acidic food. Make sure that no granules remain in the packet. Swallow the mxiture of the food and granules without chewing. Take your medicine at regular intervals. Do not take it more often than directed. Take all of your medicine as directed even if you think you are better. Do not skip doses or stop your medicine early. Contact your pediatrician or health care professional regarding the use of this medicine in children. While this medicine may be prescribed for children as young as 4 years for selected conditions, precautions do apply. What if I miss a dose? If you miss a dose, take it as soon as you can. If it is almost time for your next dose, take only that dose. Do not take double or extra doses. What may interact with this medicine? Do  not take this medicine with any of the following medications: -thioridazine This medicine may also interact with the following medications: -beta-blockers -caffeine -cimetidine -cyclosporine -MAOIs like Carbex, Eldepryl, Marplan, Nardil, and Parnate -medicines for fungal infections like fluconazole and ketoconazole -medicines for irregular heartbeat like amiodarone, flecainide and propafenone -rifampin -SSRIs like citalopram, escitalopram, fluoxetine, fluvoxamine, paroxetine and sertraline -tricyclic antidepressants like amitriptyline, clomipramine, desipramine, imipramine, nortriptyline, and others -warfarin What should I watch for while using this medicine? Your doctor may monitor your liver function. Tell your doctor right away if you have nausea or vomiting, loss of appetite, stomach pain on your right upper side, yellow skin, dark urine, light stools, or are over tired. You need to take this medicine for 6 weeks or longer to cure the fungal infection. Take your medicine regularly for as long as your doctor or health care professional tells you to. What side effects may I notice from receiving this medicine? Side effects that you should report to your doctor or health care professional as soon as possible: -allergic reactions like skin rash or hives, swelling of the face, lips, or tongue -change in vision -dark urine -fever or infection -general ill feeling or flu-like symptoms -light-colored stools -loss of appetite, nausea -redness, blistering, peeling or loosening of the skin, including inside the mouth -right upper belly pain -unusually weak or tired -yellowing of the eyes or skin Side effects that usually do not require medical attention (report to your doctor or health care professional if they continue or are bothersome): -changes in taste -diarrhea -hair loss -muscle   or joint pain -stomach upset Where should I keep my medicine? Keep out of the reach of  children. Store at room temperature between 15 and 30 degrees C (59 and 86 degrees F). Throw away any unused medicine after the expiration date.  2017 Elsevier/Gold Standard (2007-07-28 17:25:48)  

## 2016-05-06 NOTE — Progress Notes (Signed)
Subjective: 45 year old female presents to the office today for follow-up evaluation of nail fungus. She states that she completed 30 days of Lamisil. She cannot follow-up after that. She states the fungus is printed other toenails. She denies any pain associated with the nails and denies any redness or drainage or any swelling.  Denies any systemic complaints such as fevers, chills, nausea, vomiting. No acute changes since last appointment, and no other complaints at this time.   Objective: AAO x3, NAD DP/PT pulses palpable bilaterally, CRT less than 3 seconds Nails continue be hypertrophic, dystrophic, discolored. There is white discoloration to the toenails. There is no tenderness the nails there is no swelling or redness or drainage.  No significant tinea pedis is present today. No open lesions or pre-ulcerative lesions.  No pain with calf compression, swelling, warmth, erythema  Assessment: Onychomycosis  Plan: -All treatment options discussed with the patient including all alternatives, risks, complications.  She completed 30 days of the lamisil but unfortunately did not follow-up and she did not complete a full course. She will be going to proceed with this. Gave her 30 more days of the Lamisil as well as repeat blood work. She's not to start the medicines I call her the results of the blood work she understood this. As discussed side effects the medication today. -Follow-up in 4 weeks or sooner if needed. At the point we will picture she's not have any side effects the medicine and check her toenails and likely continue medicine for 60 more days.  Ovid Curd, DPM

## 2016-05-10 ENCOUNTER — Ambulatory Visit (INDEPENDENT_AMBULATORY_CARE_PROVIDER_SITE_OTHER): Payer: Medicaid Other | Admitting: *Deleted

## 2016-05-10 DIAGNOSIS — I639 Cerebral infarction, unspecified: Secondary | ICD-10-CM | POA: Diagnosis not present

## 2016-05-11 ENCOUNTER — Telehealth: Payer: Self-pay | Admitting: Internal Medicine

## 2016-05-11 NOTE — Progress Notes (Signed)
Carelink Summary Report / Loop Recorder 

## 2016-05-11 NOTE — Telephone Encounter (Signed)
A user error has taken place.

## 2016-05-12 ENCOUNTER — Other Ambulatory Visit (INDEPENDENT_AMBULATORY_CARE_PROVIDER_SITE_OTHER): Payer: Medicaid Other

## 2016-05-12 DIAGNOSIS — Z79899 Other long term (current) drug therapy: Secondary | ICD-10-CM | POA: Diagnosis not present

## 2016-05-13 ENCOUNTER — Encounter: Payer: Self-pay | Admitting: Internal Medicine

## 2016-05-13 ENCOUNTER — Ambulatory Visit (INDEPENDENT_AMBULATORY_CARE_PROVIDER_SITE_OTHER): Payer: Medicaid Other | Admitting: Internal Medicine

## 2016-05-13 VITALS — BP 104/70 | HR 76 | Ht 62.0 in | Wt 171.5 lb

## 2016-05-13 DIAGNOSIS — K5900 Constipation, unspecified: Secondary | ICD-10-CM

## 2016-05-13 DIAGNOSIS — K219 Gastro-esophageal reflux disease without esophagitis: Secondary | ICD-10-CM

## 2016-05-13 LAB — CBC WITH DIFFERENTIAL/PLATELET
BASOS ABS: 0 10*3/uL (ref 0.0–0.2)
BASOS: 1 %
EOS (ABSOLUTE): 0.2 10*3/uL (ref 0.0–0.4)
Eos: 3 %
HEMOGLOBIN: 14.2 g/dL (ref 11.1–15.9)
Hematocrit: 42 % (ref 34.0–46.6)
IMMATURE GRANS (ABS): 0 10*3/uL (ref 0.0–0.1)
Immature Granulocytes: 0 %
LYMPHS ABS: 2.6 10*3/uL (ref 0.7–3.1)
LYMPHS: 53 %
MCH: 28.6 pg (ref 26.6–33.0)
MCHC: 33.8 g/dL (ref 31.5–35.7)
MCV: 85 fL (ref 79–97)
Monocytes Absolute: 0.3 10*3/uL (ref 0.1–0.9)
Monocytes: 7 %
NEUTROS ABS: 1.8 10*3/uL (ref 1.4–7.0)
Neutrophils: 36 %
PLATELETS: 268 10*3/uL (ref 150–379)
RBC: 4.97 x10E6/uL (ref 3.77–5.28)
RDW: 12.4 % (ref 12.3–15.4)
WBC: 4.9 10*3/uL (ref 3.4–10.8)

## 2016-05-13 LAB — HEPATIC FUNCTION PANEL
ALK PHOS: 73 IU/L (ref 39–117)
ALT: 21 IU/L (ref 0–32)
AST: 16 IU/L (ref 0–40)
Albumin: 4.3 g/dL (ref 3.5–5.5)
BILIRUBIN, DIRECT: 0.09 mg/dL (ref 0.00–0.40)
Bilirubin Total: 0.3 mg/dL (ref 0.0–1.2)
TOTAL PROTEIN: 7.9 g/dL (ref 6.0–8.5)

## 2016-05-13 MED ORDER — LINACLOTIDE 72 MCG PO CAPS: ORAL_CAPSULE | ORAL | 10 refills | Status: DC

## 2016-05-13 MED ORDER — PANTOPRAZOLE SODIUM 40 MG PO TBEC: 40.0000 mg | DELAYED_RELEASE_TABLET | Freq: Every day | ORAL | 10 refills | Status: DC

## 2016-05-13 MED ORDER — RANITIDINE HCL 150 MG PO CAPS
150.0000 mg | ORAL_CAPSULE | Freq: Every evening | ORAL | 10 refills | Status: DC
Start: 2016-05-13 — End: 2016-06-10

## 2016-05-13 NOTE — Patient Instructions (Addendum)
Continue your Linzess.  Continue pantoprazole every morning.  Continue zantac every evening.  Please purchase the following medications over the counter and take as directed: Benefiber or Fiber wafers  You will be due for a recall colonoscopy in 01/2020. We will send you a reminder in the mail when it gets closer to that time.  Please follow up with Dr Rhea Belton in 1 year.  If you are age 43 or older, your body mass index should be between 23-30. Your Body mass index is 31.37 kg/m. If this is out of the aforementioned range listed, please consider follow up with your Primary Care Provider.  If you are age 39 or younger, your body mass index should be between 19-25. Your Body mass index is 31.37 kg/m. If this is out of the aformentioned range listed, please consider follow up with your Primary Care Provider.

## 2016-05-13 NOTE — Progress Notes (Signed)
   Subjective:    Patient ID: Amber [name], female    DOB: 1971/05/10, 45 y.o.   MRN: [id number]  HPI Amber [name] is a 45 year old female with a history of chronic constipation, GERD, history of ischemic stroke, hypertension and hyperlipidemia is here for follow-up. She was last seen in July 2017.  After her last visit we started her on a lower dose of Linzess at 72 g daily. Linzess 145 g daily resulted in diarrhea and Amitiza twice daily was ineffective. She reports on the whole this is been working well for her with a bowel movement every day to every other day though at times her stools are still loose. I had recommended fiber but she has not been consistent with this. No blood in her stool or melena. No abdominal pain.  Her reflux is been better controlled since adding ranitidine 150 mg in the evening. Her nocturnal regurgitation and burning chest discomfort have resolved entirely. No dysphagia or odynophagia. She also continues pantoprazole 40 mg each morning.   Review of Systems As per history of present illness, otherwise negative  Current Medications, Allergies, Past Medical History, Past Surgical History, Family History and Social History were reviewed in [employer] record.     Objective:   Physical Exam BP 104/70 (BP Location: Left Arm, Patient Position: Sitting, Cuff Size: Normal)   Pulse 76   Ht 5\' 2"  (1.575 m)   Wt 171 lb 8 oz (77.8 kg)   BMI 31.37 kg/m  Constitutional: Well-developed and well-nourished. No distress. HEENT: Normocephalic and atraumatic. Oropharynx is clear and moist. No oropharyngeal exudate. Conjunctivae are normal.  No scleral icterus. Neck: Neck supple. Trachea midline. Cardiovascular: Normal rate, regular rhythm and intact distal pulses.  Pulmonary/chest: Effort normal and breath sounds normal. No wheezing, rales or rhonchi. Abdominal: Soft, nontender, nondistended. Bowel sounds active throughout.  Extremities: no  clubbing, cyanosis, or edema Neurological: Alert and oriented to person place and time. Skin: Skin is warm and dry.  Psychiatric: Normal mood and affect. Behavior is normal.      Assessment & Plan:  45 year old female with a history of chronic constipation, GERD, history of ischemic stroke, hypertension and hyperlipidemia is here for follow-up.  1. Chronic constipation -- continue Linzess 72 g daily. I encouraged her to add a daily fiber supplement. Benefiber, Metamucil or Metamucil fiber wafers which she has used in the past. I think this will help bulk the stool and prevent the looseness associated with her current bowel movement. I do think the Linzess on an ongoing basis is necessary to prevent the constipation she was having previously. She is happy with this plan and I asked that she notify me if stools remain loose after fiber supplementation. If so we could consider trulance.  2. GERD -- well-controlled with pantoprazole 40 mg each morning and ranitidine 150 mg each evening. No current alarm symptoms  3. Colon cancer screening -- recommended in 2021, 10 years from her past colonoscopy.  15 minutes spent with the patient today. Greater than 50% was spent in counseling and coordination of care with the patient

## 2016-05-14 ENCOUNTER — Telehealth: Payer: Self-pay | Admitting: *Deleted

## 2016-05-14 MED ORDER — TERBINAFINE HCL 250 MG PO TABS: 250.0000 mg | ORAL_TABLET | Freq: Every day | ORAL | 0 refills | Status: DC

## 2016-05-14 NOTE — Telephone Encounter (Signed)
Informed pt of result and sent in 30- days of Lamisil. Will follow up in 1 month.

## 2016-05-14 NOTE — Telephone Encounter (Signed)
-----   Message from Vivi Barrack, DPM sent at 05/13/2016  7:09 AM EST ----- Can start lamisil. Please let her know.

## 2016-05-26 LAB — CUP PACEART REMOTE DEVICE CHECK
Date Time Interrogation Session: 20171111221145
MDC IDC PG IMPLANT DT: 20161017

## 2016-06-03 ENCOUNTER — Ambulatory Visit: Payer: Medicaid Other | Admitting: Podiatry

## 2016-06-09 ENCOUNTER — Ambulatory Visit (INDEPENDENT_AMBULATORY_CARE_PROVIDER_SITE_OTHER): Payer: Medicaid Other | Admitting: *Deleted

## 2016-06-09 DIAGNOSIS — I639 Cerebral infarction, unspecified: Secondary | ICD-10-CM

## 2016-06-10 ENCOUNTER — Emergency Department (HOSPITAL_COMMUNITY): Payer: Medicaid Other

## 2016-06-10 ENCOUNTER — Observation Stay (HOSPITAL_COMMUNITY): Payer: Medicaid Other

## 2016-06-10 ENCOUNTER — Observation Stay (HOSPITAL_COMMUNITY)
Admission: EM | Admit: 2016-06-10 | Discharge: 2016-06-11 | Disposition: A | Discharge status: Home / Self Care | Payer: Medicaid Other | Attending: Family Medicine | Admitting: Family Medicine

## 2016-06-10 ENCOUNTER — Encounter (HOSPITAL_COMMUNITY): Payer: Self-pay | Admitting: Emergency Medicine

## 2016-06-10 ENCOUNTER — Telehealth: Payer: Self-pay | Admitting: Primary Care

## 2016-06-10 ENCOUNTER — Ambulatory Visit: Payer: Medicaid Other | Admitting: Podiatry

## 2016-06-10 DIAGNOSIS — Z90710 Acquired absence of both cervix and uterus: Secondary | ICD-10-CM | POA: Diagnosis not present

## 2016-06-10 DIAGNOSIS — I638 Other cerebral infarction: Secondary | ICD-10-CM

## 2016-06-10 DIAGNOSIS — R531 Weakness: Secondary | ICD-10-CM | POA: Diagnosis not present

## 2016-06-10 DIAGNOSIS — K589 Irritable bowel syndrome without diarrhea: Secondary | ICD-10-CM | POA: Diagnosis not present

## 2016-06-10 DIAGNOSIS — J45909 Unspecified asthma, uncomplicated: Secondary | ICD-10-CM | POA: Diagnosis not present

## 2016-06-10 DIAGNOSIS — Z823 Family history of stroke: Secondary | ICD-10-CM | POA: Diagnosis not present

## 2016-06-10 DIAGNOSIS — E669 Obesity, unspecified: Secondary | ICD-10-CM | POA: Diagnosis not present

## 2016-06-10 DIAGNOSIS — I69923 Fluency disorder following unspecified cerebrovascular disease: Secondary | ICD-10-CM | POA: Diagnosis not present

## 2016-06-10 DIAGNOSIS — R4701 Aphasia: Secondary | ICD-10-CM

## 2016-06-10 DIAGNOSIS — E785 Hyperlipidemia, unspecified: Secondary | ICD-10-CM | POA: Diagnosis not present

## 2016-06-10 DIAGNOSIS — E1169 Type 2 diabetes mellitus with other specified complication: Secondary | ICD-10-CM | POA: Diagnosis present

## 2016-06-10 DIAGNOSIS — G43909 Migraine, unspecified, not intractable, without status migrainosus: Secondary | ICD-10-CM | POA: Insufficient documentation

## 2016-06-10 DIAGNOSIS — R2689 Other abnormalities of gait and mobility: Secondary | ICD-10-CM

## 2016-06-10 DIAGNOSIS — I10 Essential (primary) hypertension: Secondary | ICD-10-CM | POA: Diagnosis not present

## 2016-06-10 DIAGNOSIS — I69323 Fluency disorder following cerebral infarction: Principal | ICD-10-CM | POA: Insufficient documentation

## 2016-06-10 DIAGNOSIS — I639 Cerebral infarction, unspecified: Secondary | ICD-10-CM | POA: Diagnosis not present

## 2016-06-10 DIAGNOSIS — Z6831 Body mass index (BMI) 31.0-31.9, adult: Secondary | ICD-10-CM | POA: Diagnosis not present

## 2016-06-10 DIAGNOSIS — Z7982 Long term (current) use of aspirin: Secondary | ICD-10-CM | POA: Insufficient documentation

## 2016-06-10 DIAGNOSIS — R0602 Shortness of breath: Secondary | ICD-10-CM | POA: Insufficient documentation

## 2016-06-10 DIAGNOSIS — R4781 Slurred speech: Secondary | ICD-10-CM

## 2016-06-10 DIAGNOSIS — F329 Major depressive disorder, single episode, unspecified: Secondary | ICD-10-CM | POA: Insufficient documentation

## 2016-06-10 DIAGNOSIS — I071 Rheumatic tricuspid insufficiency: Secondary | ICD-10-CM | POA: Diagnosis not present

## 2016-06-10 DIAGNOSIS — Z881 Allergy status to other antibiotic agents status: Secondary | ICD-10-CM | POA: Insufficient documentation

## 2016-06-10 LAB — COMPREHENSIVE METABOLIC PANEL
ALBUMIN: 4.1 g/dL (ref 3.5–5.0)
ALT: 18 U/L (ref 14–54)
AST: 17 U/L (ref 15–41)
Alkaline Phosphatase: 65 U/L (ref 38–126)
Anion gap: 10 (ref 5–15)
BILIRUBIN TOTAL: 0.5 mg/dL (ref 0.3–1.2)
BUN: 13 mg/dL (ref 6–20)
CHLORIDE: 102 mmol/L (ref 101–111)
CO2: 27 mmol/L (ref 22–32)
CREATININE: 0.96 mg/dL (ref 0.44–1.00)
Calcium: 9.8 mg/dL (ref 8.9–10.3)
GFR calc Af Amer: >60 mL/min (ref >60)
GFR calc non Af Amer: >60 mL/min (ref >60)
GLUCOSE: 100 mg/dL — AB (ref 65–99)
POTASSIUM: 3.8 mmol/L (ref 3.5–5.1)
Sodium: 139 mmol/L (ref 135–145)
Total Protein: 8.1 g/dL (ref 6.5–8.1)

## 2016-06-10 LAB — I-STAT TROPONIN, ED: Troponin i, poc: 0 ng/mL (ref 0.00–0.08)

## 2016-06-10 LAB — DIFFERENTIAL
BASOS ABS: 0 10*3/uL (ref 0.0–0.1)
BASOS PCT: 1 %
EOS ABS: 0.1 10*3/uL (ref 0.0–0.7)
Eosinophils Relative: 2 %
LYMPHS ABS: 2.5 10*3/uL (ref 0.7–4.0)
Lymphocytes Relative: 53 %
MONOS PCT: 5 %
Monocytes Absolute: 0.3 10*3/uL (ref 0.1–1.0)
NEUTROS ABS: 1.8 10*3/uL (ref 1.7–7.7)
NEUTROS PCT: 39 %

## 2016-06-10 LAB — PROTIME-INR
INR: 0.92
Prothrombin Time: 12.3 seconds (ref 11.4–15.2)

## 2016-06-10 LAB — CBC
HEMATOCRIT: 45.3 % (ref 36.0–46.0)
HEMOGLOBIN: 14.8 g/dL (ref 12.0–15.0)
MCH: 28.6 pg (ref 26.0–34.0)
MCHC: 32.7 g/dL (ref 30.0–36.0)
MCV: 87.5 fL (ref 78.0–100.0)
Platelets: 252 10*3/uL (ref 150–400)
RBC: 5.18 MIL/uL — AB (ref 3.87–5.11)
RDW: 13.4 % (ref 11.5–15.5)
WBC: 4.6 10*3/uL (ref 4.0–10.5)

## 2016-06-10 LAB — I-STAT CHEM 8, ED
BUN: 16 mg/dL (ref 6–20)
CREATININE: 1 mg/dL (ref 0.44–1.00)
Calcium, Ion: 1.16 mmol/L (ref 1.15–1.40)
Chloride: 102 mmol/L (ref 101–111)
Glucose, Bld: 99 mg/dL (ref 65–99)
HCT: 45 % (ref 36.0–46.0)
Hemoglobin: 15.3 g/dL — ABNORMAL HIGH (ref 12.0–15.0)
Potassium: 3.8 mmol/L (ref 3.5–5.1)
SODIUM: 140 mmol/L (ref 135–145)
TCO2: 28 mmol/L (ref 0–100)

## 2016-06-10 LAB — APTT: APTT: 31 s (ref 24–36)

## 2016-06-10 LAB — CBG MONITORING, ED: Glucose-Capillary: 70 mg/dL (ref 65–99)

## 2016-06-10 MED ORDER — SENNOSIDES-DOCUSATE SODIUM 8.6-50 MG PO TABS: 1.0000 | ORAL_TABLET | Freq: Every evening | ORAL | Status: DC | PRN

## 2016-06-10 MED ORDER — VITAMIN D3 25 MCG (1000 UNIT) PO TABS
2000.0000 [IU] | ORAL_TABLET | Freq: Every day | ORAL | Status: DC
  Administered 2016-06-11: 2000 [IU] via ORAL
  Filled 2016-06-10 (×2): qty 2

## 2016-06-10 MED ORDER — ACETAMINOPHEN 160 MG/5ML PO SOLN: 650.0000 mg | ORAL | Status: DC | PRN

## 2016-06-10 MED ORDER — ACETAMINOPHEN 650 MG RE SUPP: 650.0000 mg | RECTAL | Status: DC | PRN

## 2016-06-10 MED ORDER — ROSUVASTATIN CALCIUM 20 MG PO TABS
20.0000 mg | ORAL_TABLET | Freq: Every day | ORAL | Status: DC
  Administered 2016-06-11: 20 mg via ORAL
  Filled 2016-06-10: qty 1

## 2016-06-10 MED ORDER — TRAZODONE HCL 50 MG PO TABS: 150.0000 mg | ORAL_TABLET | Freq: Every evening | ORAL | Status: DC | PRN

## 2016-06-10 MED ORDER — HYDROMORPHONE HCL 2 MG/ML IJ SOLN: 1.0000 mg | Freq: Once | INTRAMUSCULAR | Status: DC

## 2016-06-10 MED ORDER — HYDROCHLOROTHIAZIDE 25 MG PO TABS
25.0000 mg | ORAL_TABLET | Freq: Every day | ORAL | Status: DC
  Filled 2016-06-10: qty 1

## 2016-06-10 MED ORDER — ASPIRIN 325 MG PO TABS
325.0000 mg | ORAL_TABLET | Freq: Every day | ORAL | Status: DC
  Administered 2016-06-11: 325 mg via ORAL
  Filled 2016-06-10: qty 1

## 2016-06-10 MED ORDER — ENOXAPARIN SODIUM 40 MG/0.4ML ~~LOC~~ SOLN
40.0000 mg | SUBCUTANEOUS | Status: DC
  Administered 2016-06-10: 40 mg via SUBCUTANEOUS
  Filled 2016-06-10: qty 0.4

## 2016-06-10 MED ORDER — ASPIRIN-ACETAMINOPHEN-CAFFEINE 250-250-65 MG PO TABS
2.0000 | ORAL_TABLET | Freq: Four times a day (QID) | ORAL | Status: DC | PRN
  Administered 2016-06-11: 2 via ORAL
  Filled 2016-06-10 (×2): qty 2

## 2016-06-10 MED ORDER — DOCUSATE SODIUM 100 MG PO CAPS
100.0000 mg | ORAL_CAPSULE | Freq: Two times a day (BID) | ORAL | Status: DC
  Administered 2016-06-11: 100 mg via ORAL
  Filled 2016-06-10 (×2): qty 1

## 2016-06-10 MED ORDER — PANTOPRAZOLE SODIUM 40 MG PO TBEC
40.0000 mg | DELAYED_RELEASE_TABLET | Freq: Every day | ORAL | Status: DC
  Administered 2016-06-11: 40 mg via ORAL
  Filled 2016-06-10: qty 1

## 2016-06-10 MED ORDER — LINACLOTIDE 72 MCG PO CAPS
72.0000 ug | ORAL_CAPSULE | Freq: Every day | ORAL | Status: DC
  Filled 2016-06-10 (×2): qty 1

## 2016-06-10 MED ORDER — ACETAMINOPHEN 325 MG PO TABS
650.0000 mg | ORAL_TABLET | ORAL | Status: DC | PRN
  Administered 2016-06-10: 650 mg via ORAL
  Filled 2016-06-10: qty 2

## 2016-06-10 MED ORDER — STROKE: EARLY STAGES OF RECOVERY BOOK
Freq: Once | Status: DC
  Filled 2016-06-10: qty 1

## 2016-06-10 NOTE — H&P (Signed)
History and Physical    Amber [name] ZOX:[id number] DOB: 10-27-1970 DOA: 06/10/2016   PCP: Morrie Sheldon, NP Chief Complaint:  Chief Complaint  Patient presents with  . Stroke Symptoms  . Headache  . Shortness of Breath    HPI: Amber [name] is a 46 y.o. female with medical history significant of prior stroke in 2016.  Patient presents to the ED with c/o stuttering and word finding difficulty.  Symptoms onset at 1430, symptoms persistent since onset.  Nothing makes better or worse.  ED Course: MRI brain negative for acute stroke.  Review of Systems: As per HPI otherwise 10 point review of systems negative.    Past Medical History:  Diagnosis Date  . Asthma   . Depression    controlled  . Hyperlipidemia    controlled with medication  . Hypertension   . IBS (irritable bowel syndrome)   . Stroke (HCC) 03-13-2015  . UTI (lower urinary tract infection)     Past Surgical History:  Procedure Laterality Date  . ABDOMINAL HYSTERECTOMY  10/2006  . bowel reconstruction  10/2006   with hysterectomy  . CESAREAN SECTION  2005  . EP IMPLANTABLE DEVICE N/A 03/17/2015   Procedure: Loop Recorder Insertion;  Surgeon: Will Jorja Loa, MD;  Location: [facility] INVASIVE CV LAB;  Service: Cardiovascular;  Laterality: N/A;  . TEE WITHOUT CARDIOVERSION N/A 03/17/2015   Procedure: TRANSESOPHAGEAL ECHOCARDIOGRAM (TEE);  Surgeon: Chilton Si, MD;  Location: [facility] ENDOSCOPY;  Service: Cardiovascular;  Laterality: N/A;  . TOE SURGERY     Left 2nd metatarsal     reports that she has never smoked. She has never used smokeless tobacco. She reports that she drinks alcohol. She reports that she does not use drugs.  Allergies  Allergen Reactions  . Keflex [Cephalexin] Rash    Family History  Problem Relation Age of Onset  . Arthritis Father   . Diabetes Father   . Hypertension Father   . Hyperlipidemia Father   . Hyperlipidemia Mother   . Heart attack Maternal Grandfather   .  Stroke Paternal Grandfather       Prior to Admission medications   Medication Sig Start Date End Date Taking? Authorizing Provider  aspirin 325 MG tablet Take 1 tablet (325 mg total) by mouth daily. 03/17/15  Yes Richarda Overlie, MD  aspirin-acetaminophen-caffeine (EXCEDRIN MIGRAINE) [phone] MG tablet Take 2 tablets by mouth every 6 (six) hours as needed for headache or migraine.   Yes Historical Provider, MD  Cholecalciferol (VITAMIN D) 2000 units CAPS Take 1 capsule by mouth daily.   Yes Historical Provider, MD  docusate sodium (COLACE) 100 MG capsule Take 100 mg by mouth 2 (two) times daily.   Yes Historical Provider, MD  hydrochlorothiazide (HYDRODIURIL) 25 MG tablet TAKE ONE TABLET BY MOUTH ONCE DAILY 03/16/16  Yes Doreene Nest, NP  linaclotide (LINZESS) 72 MCG capsule TAKE ONE CAPSULE BY MOUTH ONCE DAILY BEFORE BREAKFAST 05/13/16  Yes Beverley Fiedler, MD  Multiple Vitamins-Calcium (ONE-A-DAY WOMENS FORMULA PO) Take 1 tablet by mouth daily with breakfast.   Yes Historical Provider, MD  pantoprazole (PROTONIX) 40 MG tablet Take 1 tablet (40 mg total) by mouth daily. 05/13/16  Yes Beverley Fiedler, MD  rosuvastatin (CRESTOR) 20 MG tablet Take 1 tablet (20 mg total) by mouth daily. 03/16/16  Yes Doreene Nest, NP  traZODone (DESYREL) 150 MG tablet TAKE ONE TABLET BY MOUTH AT BEDTIME AS NEEDED FOR SLEEP 04/02/16  Yes Doreene Nest, NP  vitamin C (ASCORBIC  ACID) 500 MG tablet Take 500 mg by mouth daily.   Yes Historical Provider, MD    Physical Exam: Vitals:   06/10/16 1827 06/10/16 1830 06/10/16 1845 06/10/16 1900  BP: 103/78 113/86 115/86 107/86  Pulse: 84 73 65 68  Resp: 17 19 17 18   Temp:      TempSrc:      SpO2: 99% 99% 98% (!) 89%  Weight:          Constitutional: NAD, calm, comfortable Eyes: PERRL, lids and conjunctivae normal ENMT: Mucous membranes are moist. Posterior pharynx clear of any exudate or lesions.Normal dentition.  Neck: normal, supple, no masses, no  thyromegaly Respiratory: clear to auscultation bilaterally, no wheezing, no crackles. Normal respiratory effort. No accessory muscle use.  Cardiovascular: Regular rate and rhythm, no murmurs / rubs / gallops. No extremity edema. 2+ pedal pulses. No carotid bruits.  Abdomen: no tenderness, no masses palpated. No hepatosplenomegaly. Bowel sounds positive.  Musculoskeletal: no clubbing / cyanosis. No joint deformity upper and lower extremities. Good ROM, no contractures. Normal muscle tone.  Skin: no rashes, lesions, ulcers. No induration Neurologic: CN 2-12 grossly intact. Sensation intact, DTR normal. Strength 5/5 in all 4.  Psychiatric: Normal judgment and insight. Alert and oriented x 3. Normal mood.    Labs on Admission: I have personally reviewed following labs and imaging studies  CBC:  Recent Labs Lab 06/10/16 1636 06/10/16 1646  WBC 4.6  --   NEUTROABS 1.8  --   HGB 14.8 15.3*  HCT 45.3 45.0  MCV 87.5  --   PLT 252  --    Basic Metabolic Panel:  Recent Labs Lab 06/10/16 1636 06/10/16 1646  NA 139 140  K 3.8 3.8  CL 102 102  CO2 27  --   GLUCOSE 100* 99  BUN 13 16  CREATININE 0.96 1.00  CALCIUM 9.8  --    GFR: Estimated Creatinine Clearance: 70 mL/min (by C-G formula based on SCr of 1 mg/dL). Liver Function Tests:  Recent Labs Lab 06/10/16 1636  AST 17  ALT 18  ALKPHOS 65  BILITOT 0.5  PROT 8.1  ALBUMIN 4.1   No results for input(s): LIPASE, AMYLASE in the last 168 hours. No results for input(s): AMMONIA in the last 168 hours. Coagulation Profile:  Recent Labs Lab 06/10/16 1636  INR 0.92   Cardiac Enzymes: No results for input(s): CKTOTAL, CKMB, CKMBINDEX, TROPONINI in the last 168 hours. BNP (last 3 results) No results for input(s): PROBNP in the last 8760 hours. HbA1C: No results for input(s): HGBA1C in the last 72 hours. CBG:  Recent Labs Lab 06/10/16 1645  GLUCAP 70   Lipid Profile: No results for input(s): CHOL, HDL, LDLCALC,  TRIG, CHOLHDL, LDLDIRECT in the last 72 hours. Thyroid Function Tests: No results for input(s): TSH, T4TOTAL, FREET4, T3FREE, THYROIDAB in the last 72 hours. Anemia Panel: No results for input(s): VITAMINB12, FOLATE, FERRITIN, TIBC, IRON, RETICCTPCT in the last 72 hours. Urine analysis:    Component Value Date/Time   COLORURINE YELLOW 03/14/2015 0138   APPEARANCEUR CLEAR 03/14/2015 0138   LABSPEC 1.005 03/14/2015 0138   PHURINE 6.0 03/14/2015 0138   GLUCOSEU NEGATIVE 03/14/2015 0138   HGBUR NEGATIVE 03/14/2015 0138   BILIRUBINUR negative 10/06/2015 1116   KETONESUR NEGATIVE 03/14/2015 0138   PROTEINUR +- 10/06/2015 1116   PROTEINUR NEGATIVE 03/14/2015 0138   UROBILINOGEN 0.2 10/06/2015 1116   UROBILINOGEN 0.2 03/14/2015 0138   NITRITE negative 10/06/2015 1116   NITRITE NEGATIVE 03/14/2015 0138  LEUKOCYTESUR Negative 10/06/2015 1116   Sepsis Labs: @LABRCNTIP (procalcitonin:4,lacticidven:4) )No results found for this or any previous visit (from the past 240 hour(s)).   Radiological Exams on Admission: Dg Chest 2 View  Result Date: 06/10/2016 CLINICAL DATA:  Shortness of breath and weakness for 1 day EXAM: CHEST  2 VIEW COMPARISON:  None. FINDINGS: The heart size and mediastinal contours are within normal limits. Both lungs are clear. The visualized skeletal structures are unremarkable. Electronic device overlying the left lower chest. IMPRESSION: No active cardiopulmonary disease. Electronically Signed   By: Jasmine Pang M.D.   On: 06/10/2016 20:02   Mr Brain Wo Contrast  Result Date: 06/10/2016 CLINICAL DATA:  46 y/o F; history of prior stroke presenting with stuttering speech in word-finding difficulty. EXAM: MRI HEAD WITHOUT CONTRAST TECHNIQUE: Axial and coronal diffusion weighted imaging of the brain was performed. COMPARISON:  06/10/2016 CT head. FINDINGS: No diffusion restriction within the brain to suggest acute or early subacute infarct. The B0 DWI sequence demonstrates  small lacunar infarcts within the left cerebellar hemisphere. No abnormal diffusion signal of the skull, paranasal sinuses, or orbits. IMPRESSION: No diffusion restriction within the brain to suggest acute or early subacute infarct. Electronically Signed   By: Mitzi Hansen M.D.   On: 06/10/2016 18:05   Mr Maxine Glenn Head/brain ZO Cm  Result Date: 06/10/2016 CLINICAL DATA:  46 y/o  F; speech difficulty. EXAM: MRA HEAD WITHOUT CONTRAST TECHNIQUE: Angiographic images of the Circle of Willis were obtained using MRA technique without intravenous contrast. COMPARISON:  CT angiogram head and neck dated 03/14/2015. FINDINGS: Internal carotid arteries:  Patent. Anterior cerebral arteries:  Patent. Middle cerebral arteries: Patent. Anterior communicating artery: Patent. Posterior communicating arteries: Not identified, likely hypoplastic or absent. Posterior cerebral arteries:  Patent. Basilar artery:  Patent. Vertebral arteries:  Patent. No evidence of high-grade stenosis, large vessel occlusion, or aneurysm unless noted above. IMPRESSION: Normal MRA of the head.  No significant interval change. Electronically Signed   By: Mitzi Hansen M.D.   On: 06/10/2016 21:04   Ct Head Code Stroke Wo Contrast`  Result Date: 06/10/2016 CLINICAL DATA:  Code stroke.  Slurred speech.  History of stroke. EXAM: CT HEAD WITHOUT CONTRAST TECHNIQUE: Contiguous axial images were obtained from the base of the skull through the vertex without intravenous contrast. COMPARISON:  CT of the head dated 03/14/2015. FINDINGS: Brain: No evidence of acute infarction, hemorrhage, hydrocephalus, extra-axial collection or mass lesion/mass effect. Stable small lacunar infarcts within the left cerebellar hemisphere. Vascular: No hyperdense vessel or unexpected calcification. Skull: Normal. Negative for fracture or focal lesion. Sinuses/Orbits: No acute finding. Other: None. ASPECTS [facility] Stroke Program Early CT Score) - Ganglionic  level infarction (caudate, lentiform nuclei, internal capsule, insula, M1-M3 cortex): 7 - Supraganglionic infarction (M4-M6 cortex): 3 Total score (0-10 with 10 being normal): 10 IMPRESSION: 1. No acute intracranial abnormality identified. If symptoms persist or if clinically indicated MRI is more sensitive for acute stroke. 2. ASPECTS is 10 These results were called by telephone at the time of interpretation on 06/10/2016 at 5:16 pm to Dr. Avon Gully , who verbally acknowledged these results. Electronically Signed   By: Mitzi Hansen M.D.   On: 06/10/2016 17:17    EKG: Independently reviewed.  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 Stuttering - 1. Neurology note recommends full stroke work up so will admit for observation and stroke work up 2. CVA pathway 3. ASA 325 4. Passed swallow screen 2.  HTN - continue home meds 3. HLD - continue statin   DVT prophylaxis: Lovenox Code Status: Full Family Communication: No family in room Consults called: Neuro has seen patient in ED Admission status: Place in 53, Heywood Iles. DO [facility] Pager [phone] from 7PM-7AM  If 7AM-7PM, please contact the day physician for the patient [email or web address] Password [email or web address]  06/10/2016, 9:22 PM

## 2016-06-10 NOTE — Progress Notes (Signed)
Carelink Summary Report / Loop Recorder 

## 2016-06-10 NOTE — Telephone Encounter (Signed)
Patient Name: Amber [name] DOB: [birth year] Initial Comment Caller states that she had a stoke in 2016, and she is having tingling in arms and hands now and she feels like she is in a marathon. She is having shortness of breath. Nurse Assessment Nurse: Leveda Anna, RN, Aeriel Date/Time Lamount Cohen Time): 06/10/2016 2:48:32 PM Confirm and document reason for call. If symptomatic, describe symptoms. ---Caller states that she had a stroke in 2016, and she is having tingling in both arms and hands now and she feels like she is in a marathon. She is having shortness of breath and a lot of weakness. She is not having tingling right now. She does have a cough. Does the patient have any new or worsening symptoms? ---Yes Will a triage be completed? ---Yes Related visit to physician within the last 2 weeks? ---No Does the PT have any chronic conditions? (i.e. diabetes, asthma, etc.) ---Yes List chronic conditions. ---stroke, htn, high cholesterol, gi issues, Is the patient pregnant or possibly pregnant? (Ask all females between the ages of 45-55) ---No Is this a behavioral health or substance abuse call? ---No Guidelines Guideline Title Affirmed Question Affirmed Notes Asthma Attack [1] Severe wheezing or coughing AND [2] doesn't have neb or inhaler available Final Disposition User Call EMS 911 Now Hensel, RN, Aeriel Comments PT speech is changing. Nurse advising pt call 911. Caller refused 911, caller requested I call her dad and have him come and get her dad requested that i stay on the phone with her until her arrives. Nurse staying on the phone with her until he arrives. Pt is not confused nurse did inform caller if the line goes silent she will call 911 instead. Nurse stayed on the line with the pt until pt father arrived. They are taking pt to Ed. Referrals [facility] - ED Disagree/Comply: Disagree Disagree/Comply Reason: Disagree with instructions

## 2016-06-10 NOTE — ED Notes (Signed)
Pt using the bedside commode 

## 2016-06-10 NOTE — ED Triage Notes (Signed)
Pt to ED from home, with c/o stuttering and speech slurring-- pt had a stroke 03/13/15 --  Last normal 1430 --

## 2016-06-10 NOTE — Code Documentation (Signed)
46 y.o. female with a hx of prior stroke in 2016 presents to [facility] ED via POV with stuttering speech and some word finding difficulty. Per the patient's report, she has been experiencing numbness and tingling in her bilateral upper and lower extremities for the past month. Today she stated that the numbness and tingling increased in her hands and feet which prompted a call to her PCP. While on the phone with an RN at her PCP office, the RN appreciated changes in the patients's speech. RN told her to hangup and call 911. The pt refused and her father was called instead. Her father arrived to the pt and brought her into [facility]. Upon arrival at [facility], labs were drawn and CT completed. CT showed no intracranial abnormalities. NIHSS 1 for aphasia per neurologist. See EMR for NIHSS and code stroke times. The patient did seem to have some stuttering speech and some word finding difficulty. No tPA given d/t too mild to treat. Pt to have DWI to r/o stroke. Bedside handoff with ED RN Jonny Ruiz.

## 2016-06-10 NOTE — Telephone Encounter (Signed)
Per chart review tab pt did go to [facility] ED.

## 2016-06-10 NOTE — ED Provider Notes (Signed)
[facility] DEPT Provider Note   CSN: [id number] Arrival date & time: 06/10/16  1538     History   Chief Complaint Chief Complaint  Patient presents with  . Stroke Symptoms  . Headache  . Shortness of Breath    HPI Amber [name] is a 47 y.o. female.  Patient is a pleasant 46 year old female with a history of stroke, hypertension, hyperlipidemia, prediabetes presenting today with aphasia and slurred speech that started about 2:30 this afternoon while she was on the phone with the neurology office. Patient states for the last several days she has had diffuse body tingling intermittently but today was the first day with the speech difficulty. Patient has had a dry cough for the last 2 weeks with some intermittent shortness of breath but no chest pain. Today when she was on the phone as when the speech difficulty started. She does admit to occasionally having stuttering but this is different. She is having a hard time thinking and sometimes getting her words out. Her dad states her speech is much different than normal. She denies any unilateral numbness, weakness, headache, vision changes.  She has taken all of her medications today and has not missed any medicines.      Past Medical History:  Diagnosis Date  . Asthma   . Depression    controlled  . Hyperlipidemia    controlled with medication  . Hypertension   . IBS (irritable bowel syndrome)   . Stroke (HCC) 03-13-2015  . UTI (lower urinary tract infection)     Patient Active Problem List   Diagnosis Date Noted  . Low back pain 10/06/2015  . Prediabetes 09/08/2015  . Other fatigue 04/03/2015  . Constipation 03/31/2015  . Dysarthria   . Acute ischemic stroke (HCC) 03/14/2015  . Insomnia 09/29/2014  . IBS (irritable bowel syndrome) 09/29/2014  . Hyperlipidemia 09/29/2014  . Essential hypertension 09/29/2014    Past Surgical History:  Procedure Laterality Date  . ABDOMINAL HYSTERECTOMY  10/2006  . bowel  reconstruction  10/2006   with hysterectomy  . CESAREAN SECTION  2005  . EP IMPLANTABLE DEVICE N/A 03/17/2015   Procedure: Loop Recorder Insertion;  Surgeon: Will Jorja Loa, MD;  Location: [facility];  Service: Cardiovascular;  Laterality: N/A;  . TEE WITHOUT CARDIOVERSION N/A 03/17/2015   Procedure: TRANSESOPHAGEAL ECHOCARDIOGRAM (TEE);  Surgeon: Chilton Si, MD;  Location: [facility] ENDOSCOPY;  Service: Cardiovascular;  Laterality: N/A;  . TOE SURGERY     Left 2nd metatarsal    OB History    Gravida Para Term Preterm AB Living   1 1 0 1 0 1   SAB TAB Ectopic Multiple Live Births   0 0 0 0         Home Medications    Prior to Admission medications   Medication Sig Start Date End Date Taking? Authorizing Provider  aspirin 325 MG tablet Take 1 tablet (325 mg total) by mouth daily. 03/17/15   Richarda Overlie, MD  aspirin-acetaminophen-caffeine (EXCEDRIN MIGRAINE) [phone] MG tablet Take 2 tablets by mouth every 6 (six) hours as needed for headache or migraine.    Historical Provider, MD  Cholecalciferol (VITAMIN D) 2000 units CAPS Take 1 capsule by mouth daily.    Historical Provider, MD  cyclobenzaprine (FLEXERIL) 5 MG tablet Take 1 tablet by mouth once to twice daily as needed for spasms. Patient not taking: Reported on 05/13/2016 11/21/15   Doreene Nest, NP  docusate sodium (COLACE) 100 MG capsule Take 100 mg  by mouth 2 (two) times daily.    Historical Provider, MD  hydrochlorothiazide (HYDRODIURIL) 25 MG tablet TAKE ONE TABLET BY MOUTH ONCE DAILY 03/16/16   Doreene Nest, NP  linaclotide [facility]) 72 MCG capsule TAKE ONE CAPSULE BY MOUTH ONCE DAILY BEFORE BREAKFAST 05/13/16   Beverley Fiedler, MD  pantoprazole (PROTONIX) 40 MG tablet Take 1 tablet (40 mg total) by mouth daily. 05/13/16   Beverley Fiedler, MD  ranitidine (ZANTAC) 150 MG capsule Take 1 capsule (150 mg total) by mouth every evening. 05/13/16   Beverley Fiedler, MD  rosuvastatin (CRESTOR) 20 MG tablet Take 1  tablet (20 mg total) by mouth daily. 03/16/16   Doreene Nest, NP  terbinafine (LAMISIL) 250 MG tablet Take 1 tablet (250 mg total) by mouth daily. 05/14/16   Vivi Barrack, DPM  traZODone (DESYREL) 150 MG tablet TAKE ONE TABLET BY MOUTH AT BEDTIME AS NEEDED FOR SLEEP 04/02/16   Doreene Nest, NP  vitamin C (ASCORBIC ACID) 500 MG tablet Take 500 mg by mouth daily.    Historical Provider, MD    Family History Family History  Problem Relation Age of Onset  . Arthritis Father   . Diabetes Father   . Hypertension Father   . Hyperlipidemia Father   . Hyperlipidemia Mother   . Heart attack Maternal Grandfather   . Stroke Paternal Grandfather     Social History Social History  Substance Use Topics  . Smoking status: Never Smoker  . Smokeless tobacco: Never Used  . Alcohol use 0.0 oz/week     Comment: rarely     Allergies   Keflex [cephalexin]   Review of Systems Review of Systems  All other systems reviewed and are negative.    Physical Exam Updated Vital Signs BP 122/95   Pulse 81   Temp 98.5 F (36.9 C) (Oral)   Resp 19   Wt 171 lb 1.2 oz (77.6 kg)   SpO2 98%   BMI 31.29 kg/m   Physical Exam  Constitutional: She is oriented to person, place, and time. She appears well-developed and well-nourished. No distress.  HENT:  Head: Normocephalic and atraumatic.  Mouth/Throat: Oropharynx is clear and moist.  Eyes: Conjunctivae and EOM are normal. Pupils are equal, round, and reactive to light.  Neck: Normal range of motion. Neck supple.  Cardiovascular: Normal rate, regular rhythm and intact distal pulses.   No murmur heard. Pulmonary/Chest: Effort normal and breath sounds normal. No respiratory distress. She has no wheezes. She has no rales.  Abdominal: Soft. She exhibits no distension. There is no tenderness. There is no rebound and no guarding.  Musculoskeletal: Normal range of motion. She exhibits no edema or tenderness.  Neurological: She is alert and  oriented to person, place, and time.  No sensory deficits on the face and upper or lower extremities.  5 strength in the upper and lower extremities. No pronator drift. Normal heel-to-shin. Minimal word finding difficulty with mild slurred speech but no facial droop  Skin: Skin is warm and dry. No rash noted. No erythema.  Psychiatric: She has a normal mood and affect. Her behavior is normal.  Nursing note and vitals reviewed.    ED Treatments / Results  Labs (all labs ordered are listed, but only abnormal results are displayed) Labs Reviewed  CBC - Abnormal; Notable for the following:       Result Value   RBC 5.18 (*)    All other components within normal limits  COMPREHENSIVE  METABOLIC PANEL - Abnormal; Notable for the following:    Glucose, Bld 100 (*)    All other components within normal limits  I-STAT CHEM 8, ED - Abnormal; Notable for the following:    Hemoglobin 15.3 (*)    All other components within normal limits  PROTIME-INR  APTT  DIFFERENTIAL  I-STAT TROPOININ, ED  CBG MONITORING, ED    EKG  EKG Interpretation  Date/Time:  Thursday June 10 2016 17:01:07 EST Ventricular Rate:  93 PR Interval:    QRS Duration: 74 QT Interval:  360 QTC Calculation: 448 R Axis:   -6 Text Interpretation:  Sinus rhythm Low voltage, precordial leads No significant change since last tracing Confirmed by Anitra Lauth  MD, Alphonzo Lemmings ([id number]) on 06/10/2016 5:13:02 PM       Radiology Mr Brain Wo Contrast  Result Date: 06/10/2016 CLINICAL DATA:  46 y/o F; history of prior stroke presenting with stuttering speech in word-finding difficulty. EXAM: MRI HEAD WITHOUT CONTRAST TECHNIQUE: Axial and coronal diffusion weighted imaging of the brain was performed. COMPARISON:  06/10/2016 CT head. FINDINGS: No diffusion restriction within the brain to suggest acute or early subacute infarct. The B0 DWI sequence demonstrates small lacunar infarcts within the left cerebellar hemisphere. No abnormal  diffusion signal of the skull, paranasal sinuses, or orbits. IMPRESSION: No diffusion restriction within the brain to suggest acute or early subacute infarct. Electronically Signed   By: Mitzi Hansen M.D.   On: 06/10/2016 18:05   Ct Head Code Stroke Wo Contrast`  Result Date: 06/10/2016 CLINICAL DATA:  Code stroke.  Slurred speech.  History of stroke. EXAM: CT HEAD WITHOUT CONTRAST TECHNIQUE: Contiguous axial images were obtained from the base of the skull through the vertex without intravenous contrast. COMPARISON:  CT of the head dated 03/14/2015. FINDINGS: Brain: No evidence of acute infarction, hemorrhage, hydrocephalus, extra-axial collection or mass lesion/mass effect. Stable small lacunar infarcts within the left cerebellar hemisphere. Vascular: No hyperdense vessel or unexpected calcification. Skull: Normal. Negative for fracture or focal lesion. Sinuses/Orbits: No acute finding. Other: None. ASPECTS [facility] Stroke Program Early CT Score) - Ganglionic level infarction (caudate, lentiform nuclei, internal capsule, insula, M1-M3 cortex): 7 - Supraganglionic infarction (M4-M6 cortex): 3 Total score (0-10 with 10 being normal): 10 IMPRESSION: 1. No acute intracranial abnormality identified. If symptoms persist or if clinically indicated MRI is more sensitive for acute stroke. 2. ASPECTS is 10 These results were called by telephone at the time of interpretation on 06/10/2016 at 5:16 pm to Dr. Avon Gully , who verbally acknowledged these results. Electronically Signed   By: Mitzi Hansen M.D.   On: 06/10/2016 17:17    Procedures Procedures (including critical care time)  Medications Ordered in ED Medications - No data to display   Initial Impression / Assessment and Plan / ED Course  I have reviewed the triage vital signs and the nursing notes.  Pertinent labs & imaging results that were available during my care of the patient were reviewed by me and considered in my medical  decision making (see chart for details).  Clinical Course     Patient is a 46 year old female with prior history of stroke, hypertension, hyperlipidemia and prediabetes presenting today with slurred speech and mild aphasia. It started approximately 2:30 while she was on the phone with the neurology office. Over the last few days she has been having full-body tingling which is been intermittent since she was calling for an appointment. However the speech thing started at 2:30. She states this happened once  before when she had her stroke in 2016. She also complains of a dry cough over the last 2 weeks that has been persistent. She states when she was making her bed today she has slightly short of breath but denies any chest pain, arm pain, abdominal pain or wheezing. She does not use drugs or alcohol. She does not smoke. She continually states I do not feel bad but does recognize that her voice is not normal. Code Stroke was initiated and the neurology team at bedside. Patient has an NIH of 1 and CT was normal. Labs without significant findings.  6:58 PM MRI neg but neurology recommended admission and workup  Final Clinical Impressions(s) / ED Diagnoses   Final diagnoses:  Aphasia  CVA (cerebral vascular accident) [facility])    New Prescriptions New Prescriptions   No medications on file     Gwyneth Sprout, MD 06/10/16 1859

## 2016-06-10 NOTE — Consult Note (Signed)
Requesting Physician: Dr. Anitra Lauth    Chief Complaint: code stroke, stuttering speech  History obtained from:  Patient     HPI:                                                                                                                                         Amber [name] is an 46 y.o. female with a hx of prior stroke presents with stuttering speech and some word finding difficulty   Date last known well: 06/10/16 Time last known well: 230pm tPA Given: No: low NIHSS 1   Past Medical History:  Diagnosis Date  . Asthma   . Depression    controlled  . Hyperlipidemia    controlled with medication  . Hypertension   . IBS (irritable bowel syndrome)   . Stroke (HCC) 03-13-2015  . UTI (lower urinary tract infection)     Past Surgical History:  Procedure Laterality Date  . ABDOMINAL HYSTERECTOMY  10/2006  . bowel reconstruction  10/2006   with hysterectomy  . CESAREAN SECTION  2005  . EP IMPLANTABLE DEVICE N/A 03/17/2015   Procedure: Loop Recorder Insertion;  Surgeon: Will Jorja Loa, MD;  Location: [facility] INVASIVE CV LAB;  Service: Cardiovascular;  Laterality: N/A;  . TEE WITHOUT CARDIOVERSION N/A 03/17/2015   Procedure: TRANSESOPHAGEAL ECHOCARDIOGRAM (TEE);  Surgeon: Chilton Si, MD;  Location: [facility] ENDOSCOPY;  Service: Cardiovascular;  Laterality: N/A;  . TOE SURGERY     Left 2nd metatarsal    Family History  Problem Relation Age of Onset  . Arthritis Father   . Diabetes Father   . Hypertension Father   . Hyperlipidemia Father   . Hyperlipidemia Mother   . Heart attack Maternal Grandfather   . Stroke Paternal Grandfather    Social History:  reports that she has never smoked. She has never used smokeless tobacco. She reports that she drinks alcohol. She reports that she does not use drugs.  Allergies:  Allergies  Allergen Reactions  . Keflex [Cephalexin] Rash    Medications:                                                                                                                            I have reviewed the patient's current medications.  ROS:  History obtained from chart review and the patient  General ROS: negative for - chills, fatigue, fever, night sweats, weight gain or weight loss Psychological ROS: negative for - behavioral disorder, hallucinations, memory difficulties, mood swings or suicidal ideation Ophthalmic ROS: negative for - blurry vision, double vision, eye pain or loss of vision ENT ROS: negative for - epistaxis, nasal discharge, oral lesions, sore throat, tinnitus or vertigo Allergy and Immunology ROS: negative for - hives or itchy/watery eyes Hematological and Lymphatic ROS: negative for - bleeding problems, bruising or swollen lymph nodes Endocrine ROS: negative for - galactorrhea, hair pattern changes, polydipsia/polyuria or temperature intolerance Respiratory ROS: negative for - cough, hemoptysis, shortness of breath or wheezing Cardiovascular ROS: negative for - chest pain, dyspnea on exertion, edema or irregular heartbeat Gastrointestinal ROS: negative for - abdominal pain, diarrhea, hematemesis, nausea/vomiting or stool incontinence Genito-Urinary ROS: negative for - dysuria, hematuria, incontinence or urinary frequency/urgency Musculoskeletal ROS: negative for - joint swelling or muscular weakness Neurological ROS: as noted in HPI Dermatological ROS: negative for rash and skin lesion changes  Neurologic Examination:                                                                                                      Blood pressure 120/82, pulse 81, temperature 98.5 F (36.9 C), temperature source Oral, resp. rate 16, weight 77.6 kg (171 lb 1.2 oz), SpO2 98 %.  HEENT-  Normocephalic, no lesions, without obvious abnormality.  Normal external eye and conjunctiva.  Normal TM's  bilaterally.  Normal auditory canals and external ears. Normal external nose, mucus membranes and septum.  Normal pharynx. Cardiovascular- regular rate and rhythm, S1, S2 normal, no murmur, click, rub or gallop, pulses palpable throughout   Lungs- chest clear, no wheezing, rales, normal symmetric air entry, Heart exam - S1, S2 normal, no murmur, no gallop, rate regular Abdomen- soft, non-tender; bowel sounds normal; no masses,  no organomegaly   Neurological Examination Mental Status: Alert, oriented, thought content appropriate.  Speech fluent, stuttering and some mild word finding difficulty. Able to follow 3 step commands without difficulty. Cranial Nerves: II: Discs flat bilaterally; Visual fields grossly normal,  III,IV, VI: ptosis not present, extra-ocular motions intact bilaterally, pupils equal, round, reactive to light and accommodation V,VII: smile symmetric, facial light touch sensation normal bilaterally VIII: hearing normal bilaterally IX,X: uvula rises symmetrically XI: bilateral shoulder shrug XII: midline tongue extension Motor: Right : Upper extremity   5/5    Left:     Upper extremity   5/5  Lower extremity   5/5     Lower extremity   5/5 Tone and bulk:normal tone throughout; no atrophy noted Sensory: Pinprick and light touch intact throughout, bilaterally  Cerebellar: normal finger-to-nose, normal rapid alternating movements and normal heel-to-shin test        Lab Results: Basic Metabolic Panel:  Recent Labs Lab 06/10/16 1646  NA 140  K 3.8  CL 102  GLUCOSE 99  BUN 16  CREATININE 1.00    Liver Function Tests: No results for input(s): AST, ALT, ALKPHOS, BILITOT, PROT, ALBUMIN in  the last 168 hours. No results for input(s): LIPASE, AMYLASE in the last 168 hours. No results for input(s): AMMONIA in the last 168 hours.  CBC:  Recent Labs Lab 06/10/16 1636 06/10/16 1646  WBC 4.6  --   NEUTROABS 1.8  --   HGB 14.8 15.3*  HCT 45.3 45.0  MCV 87.5   --   PLT 252  --     Cardiac Enzymes: No results for input(s): CKTOTAL, CKMB, CKMBINDEX, TROPONINI in the last 168 hours.  Lipid Panel: No results for input(s): CHOL, TRIG, HDL, CHOLHDL, VLDL, LDLCALC in the last 168 hours.  CBG:  Recent Labs Lab 06/10/16 1645  GLUCAP 70    Microbiology: Results for orders placed or performed in visit on 05/03/16  Culture, Group A Strep     Status: None   Collection Time: 05/03/16 11:16 AM  Result Value Ref Range Status   Organism ID, Bacteria Abundant GROUP A STREP ([email or web address]) ISOLATED  Final    Comment: Beta hemolytic streptococci are predictably susceptible to penicillin and other beta-lactams. Susceptibility testing not routinely performed.     Coagulation Studies: No results for input(s): LABPROT, INR in the last 72 hours.  Imaging: No results found.     Assessment: 46 y.o. female with a hx of prior stroke presents with stuttering speech and some word finding difficulty   1. HgbA1c, fasting lipid panel 2. MRI, MRA  of the brain without contrast 3. PT consult, OT consult, Speech consult 4. Echocardiogram 5. Carotid dopplers 6. Prophylactic therapy-Antiplatelet med: Aspirin - dose 325 7. Risk factor modification 8. Telemetry monitoring 9. Frequent neuro checks 10 NPO until passes stroke swallow screen 11 please page stroke NP  Or  PA  Or MD from 8am -4 pm  as this patient from this time will be  followed by the stroke.   You can look them up on [email or web address]  Password [email or web address]     Stroke Risk Factors - hypertension

## 2016-06-11 ENCOUNTER — Observation Stay (HOSPITAL_BASED_OUTPATIENT_CLINIC_OR_DEPARTMENT_OTHER): Payer: Medicaid Other

## 2016-06-11 ENCOUNTER — Encounter (HOSPITAL_COMMUNITY): Payer: Medicaid Other

## 2016-06-11 DIAGNOSIS — I6789 Other cerebrovascular disease: Secondary | ICD-10-CM | POA: Diagnosis not present

## 2016-06-11 DIAGNOSIS — I10 Essential (primary) hypertension: Secondary | ICD-10-CM | POA: Diagnosis not present

## 2016-06-11 DIAGNOSIS — I69923 Fluency disorder following unspecified cerebrovascular disease: Secondary | ICD-10-CM

## 2016-06-11 LAB — LIPID PANEL
Cholesterol: 160 mg/dL (ref 0–200)
HDL: 70 mg/dL (ref >40)
LDL CALC: 76 mg/dL (ref 0–99)
TRIGLYCERIDES: 68 mg/dL (ref <150)
Total CHOL/HDL Ratio: 2.3 RATIO
VLDL: 14 mg/dL (ref 0–40)

## 2016-06-11 LAB — ECHOCARDIOGRAM COMPLETE: Weight: 2737.23 oz

## 2016-06-11 MED ORDER — GABAPENTIN 100 MG PO CAPS
100.0000 mg | ORAL_CAPSULE | Freq: Three times a day (TID) | ORAL | Status: DC
  Administered 2016-06-11: 100 mg via ORAL
  Filled 2016-06-11: qty 1

## 2016-06-11 MED ORDER — GABAPENTIN 100 MG PO CAPS: 100.0000 mg | ORAL_CAPSULE | Freq: Three times a day (TID) | ORAL | 0 refills | Status: DC

## 2016-06-11 NOTE — Evaluation (Signed)
Physical Therapy Evaluation Patient Details Name: Amber [name] MRN: [id number] DOB: [birth year] Today's Date: 06/11/2016   History of Present Illness  46 y.o. female admitted for stuttering and word finding problems. MRI negative. PMH consists of CVA (03/2015), depression and asthma.  Clinical Impression  PT eval complete. Pt is mod I with all functional mobility. See below for eval details. No further PT intervention indicated. PT signing off.    Follow Up Recommendations No PT follow up    Equipment Recommendations  None recommended by PT    Recommendations for Other Services       Precautions / Restrictions Precautions Precautions: None Restrictions Weight Bearing Restrictions: No      Mobility  Bed Mobility Overal bed mobility: Modified Independent                Transfers Overall transfer level: Modified independent Equipment used: None                Ambulation/Gait Ambulation/Gait assistance: Modified independent (Device/Increase time) Ambulation Distance (Feet): 350 Feet Assistive device: None Gait Pattern/deviations: WFL(Within Functional Limits) Gait velocity: very slow Gait velocity interpretation: Below normal speed for age/gender General Gait Details: steady, no LOB  Stairs Stairs: Yes Stairs assistance: Supervision Stair Management: One rail Right;Forwards;Alternating pattern Number of Stairs: 12    Wheelchair Mobility    Modified Rankin (Stroke Patients Only)       Balance Overall balance assessment: Modified Independent                               Standardized Balance Assessment Standardized Balance Assessment : Dynamic Gait Index   Dynamic Gait Index Level Surface: Normal Change in Gait Speed: Normal Gait with Horizontal Head Turns: Normal Gait with Vertical Head Turns: Normal Gait and Pivot Turn: Normal Step Over Obstacle: Normal Step Around Obstacles: Normal Steps: Mild Impairment Total Score:  23       Pertinent Vitals/Pain Pain Assessment: No/denies pain    Home Living Family/patient expects to be discharged to:: Private residence Living Arrangements: Children (55 y.o. 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     Hand Dominance   Dominant Hand: Right    Extremity/Trunk Assessment   Upper Extremity Assessment Upper Extremity Assessment: Defer to OT evaluation    Lower Extremity Assessment Lower Extremity Assessment: Overall WFL for tasks assessed    Cervical / Trunk Assessment Cervical / Trunk Assessment: Normal  Communication   Communication: Expressive difficulties  Cognition Arousal/Alertness: Awake/alert Behavior During Therapy: WFL for tasks assessed/performed Overall Cognitive Status: Within Functional Limits for tasks assessed                      General Comments      Exercises     Assessment/Plan    PT Assessment Patent does not need any further PT services  PT Problem List            PT Treatment Interventions      PT Goals (Current goals can be found in the Care Plan section)  Acute Rehab PT Goals Patient Stated Goal: home PT Goal Formulation: All assessment and  education complete, DC therapy    Frequency     Barriers to discharge        Co-evaluation               End of Session Equipment Utilized During Treatment: Gait belt Activity Tolerance: Patient tolerated treatment well Patient left: in bed;with call bell/phone within reach;with family/visitor present Nurse Communication: Mobility status    Functional Assessment Tool Used: clinical judgement Functional Limitation: Mobility: Walking and moving  around Mobility: Walking and Moving Around Current Status [phone]): 0 percent impaired, limited or restricted Mobility: Walking and Moving Around Goal Status [phone]): 0 percent impaired, limited or restricted Mobility: Walking and Moving Around Discharge Status [phone]): 0 percent impaired, limited or restricted    Time: 0071-2197 PT Time Calculation (min) (ACUTE ONLY): 14 min   Charges:   PT Evaluation $PT Eval Low Complexity: 1 Procedure     PT G Codes:   PT G-Codes **NOT FOR INPATIENT CLASS** Functional Assessment Tool Used: clinical judgement Functional Limitation: Mobility: Walking and moving around Mobility: Walking and Moving Around Current Status ([id number]): 0 percent impaired, limited or restricted Mobility: Walking and Moving Around Goal Status ([id number]): 0 percent impaired, limited or restricted Mobility: Walking and Moving Around Discharge Status ([id number]): 0 percent impaired, limited or restricted    Ilda Foil 06/11/2016, 11:23 AM

## 2016-06-11 NOTE — Evaluation (Signed)
Occupational Therapy Evaluation Patient Details Name: Amber [name] MRN: [id number] DOB: 1971-05-19 Today's Date: 06/11/2016    History of Present Illness 46 y.o. female admitted for stuttering and word finding problems. MRI negative. PMH consists of CVA (03/2015), depression and asthma.   Clinical Impression   Pt admitted with the above diagnoses and presents with below problem list. PTA pt was independent with ADLs. Pt is at/near baseline with ADLs. Stuttering and labored speech noted. Pt reporting difficulty forming words. No further OT needs indicated at this time OT signing off.    Follow Up Recommendations  No OT follow up    Equipment Recommendations  None recommended by OT    Recommendations for Other Services   Speech Consult.     Precautions / Restrictions Precautions Precautions: None Restrictions Weight Bearing Restrictions: No      Mobility Bed Mobility Overal bed mobility: Modified Independent                Transfers Overall transfer level: Modified independent Equipment used: None                  Balance Overall balance assessment: No apparent balance deficits (not formally assessed);Modified Independent                               Standardized Balance Assessment Standardized Balance Assessment : Dynamic Gait Index   Dynamic Gait Index Level Surface: Normal Change in Gait Speed: Normal Gait with Horizontal Head Turns: Normal Gait with Vertical Head Turns: Normal Gait and Pivot Turn: Normal Step Over Obstacle: Normal Step Around Obstacles: Normal Steps: Mild Impairment Total Score: 23      ADL Overall ADL's : At baseline                                       General ADL Comments: Pt OOB standing in room with NT present upon therapist arrival. Pt relating details of events leading to this hospital admission.      Vision     Perception     Praxis      Pertinent Vitals/Pain Pain  Assessment: No/denies pain     Hand Dominance Right   Extremity/Trunk Assessment Upper Extremity Assessment Upper Extremity Assessment: Overall WFL for tasks assessed   Lower Extremity Assessment Lower Extremity Assessment: Defer to PT evaluation;Overall [facility] for tasks assessed   Cervical / Trunk Assessment Cervical / Trunk Assessment: Normal   Communication Communication Communication: Expressive difficulties   Cognition Arousal/Alertness: Awake/alert Behavior During Therapy: WFL for tasks assessed/performed Overall Cognitive Status: Within Functional Limits for tasks assessed                     General Comments       Exercises       Shoulder Instructions      Home Living Family/patient expects to be discharged to:: Private residence Living Arrangements: Children (34 y.o. 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        OT Problem List:     OT Treatment/Interventions:      OT Goals(Current goals can be found in the care plan section) Acute Rehab OT Goals Patient Stated Goal: home  OT Frequency:     Barriers to D/C:            Co-evaluation              End of Session    Activity Tolerance: Patient tolerated treatment well Patient left: in bed;with call bell/phone within reach   Time: 1304-1320 OT Time Calculation (min): 16 min Charges:  OT General Charges $OT Visit: 1 Procedure OT Evaluation $OT Eval Low Complexity: 1 Procedure G-Codes: OT G-codes **NOT FOR  INPATIENT CLASS** Functional Assessment Tool Used: clinical judgement Functional Limitation: Self care Self Care Current Status (W5809): 0 percent impaired, limited or restricted Self Care Goal Status (X8338): 0 percent impaired, limited or restricted Self Care Discharge Status (S5053): 0 percent impaired, limited or restricted  Pilar Grammes 06/11/2016, 1:25 PM

## 2016-06-11 NOTE — Telephone Encounter (Signed)
Noted. Patient admitted.

## 2016-06-11 NOTE — Care Management Note (Signed)
Case Management Note  Patient Details  Name: Amber [name] MRN: [id number] Date of Birth: Nov 24, 1970  Subjective/Objective:                  Patient presents with stuttering and word finding difficulty. Lives at home alone. CM will follow for discharge needs pending PT/OT evals and physician orders.   Action/Plan:   Expected Discharge Date:                  Expected Discharge Plan:     In-House Referral:     Discharge planning Services     Post Acute Care Choice:    Choice offered to:     DME Arranged:    DME Agency:     HH Arranged:    HH Agency:     Status of Service:     If discussed at [employer] of Stay Meetings, dates discussed:    Additional Comments:  Anda Kraft, RN 06/11/2016, 11:11 AM

## 2016-06-11 NOTE — Progress Notes (Signed)
  2D Echocardiogram has been performed.  Amber [name] 06/11/2016, 9:21 AM

## 2016-06-11 NOTE — Progress Notes (Signed)
RN discussed discharge instructions with patient. Patient understands discharge f/u appointments, aware of gabapentin rx, aware of when all medications are due. Neuro assessment unchanged, tele removed, iv removed. Prescription and discharge instructions given to patient.

## 2016-06-11 NOTE — Evaluation (Signed)
Speech Language Pathology Evaluation Patient Details Name: Amber [name] MRN: [id number] DOB: 08-19-1970 Today's Date: 06/11/2016 Time: 1751-0258 SLP Time Calculation (min) (ACUTE ONLY): 34 min  Problem List:  Patient Active Problem List   Diagnosis Date Noted  . 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 Low back pain 10/06/2015  . Prediabetes 09/08/2015  . Other fatigue 04/03/2015  . Constipation 03/31/2015  . Dysarthria   . Acute ischemic stroke (HCC) 03/14/2015  . Insomnia 09/29/2014  . IBS (irritable bowel syndrome) 09/29/2014  . Hyperlipidemia 09/29/2014  . Essential hypertension 09/29/2014   Past Medical History:  Past Medical History:  Diagnosis Date  . Asthma   . Depression    controlled  . Hyperlipidemia    controlled with medication  . Hypertension   . IBS (irritable bowel syndrome)   . Stroke (HCC) 03-13-2015  . UTI (lower urinary tract infection)    Past Surgical History:  Past Surgical History:  Procedure Laterality Date  . ABDOMINAL HYSTERECTOMY  10/2006  . bowel reconstruction  10/2006   with hysterectomy  . CESAREAN SECTION  2005  . EP IMPLANTABLE DEVICE N/A 03/17/2015   Procedure: Loop Recorder Insertion;  Surgeon: Will Jorja Loa, MD;  Location: [facility] INVASIVE CV LAB;  Service: Cardiovascular;  Laterality: N/A;  . TEE WITHOUT CARDIOVERSION N/A 03/17/2015   Procedure: TRANSESOPHAGEAL ECHOCARDIOGRAM (TEE);  Surgeon: Chilton Si, MD;  Location: [facility] ENDOSCOPY;  Service: Cardiovascular;  Laterality: N/A;  . TOE SURGERY     Left 2nd metatarsal   HPI:  46 y.o. female admitted for stuttering and word finding problems. MRI negative. PMH consists of CVA (03/2015), depression and asthma   Assessment / Plan / Recommendation Clinical Impression  Pt assessed with Cognistat and scores fell into the average range except memory in mild deficit range. She reports having to write everything down and SLP encouraged her to continue  this. Pt exhibits part word dysfluency at the initial and medial position of words and stated it began 2 days ago. SLP educated pt to stop during the stutter, restart in a slow onset and slow rate of utterances (difficult to know if convergent disorder or true neurogenic dysfluency although MRI negative. Gave option of seeing if it improves over next week or outpatient ST. Pt chose outpatient ST.      SLP Assessment  All further Speech Lanaguage Pathology  needs can be addressed in the next venue of care    Follow Up Recommendations  Outpatient SLP    Frequency and Duration           SLP Evaluation Cognition  Overall Cognitive Status: Within Functional Limits for tasks assessed [facility] Scales of Cognitive Functioning:  (scored in mild impairments)       Comprehension  Auditory Comprehension Overall Auditory Comprehension: Appears within functional limits for tasks assessed Visual Recognition/Discrimination Discrimination: Not tested    Expression Verbal Expression Overall Verbal Expression: Appears within functional limits for tasks assessed Non-Verbal Means of Communication:  (dysfluent) Written Expression Dominant Hand: Right   Oral / Motor  Oral Motor/Sensory Function Overall Oral Motor/Sensory Function: Within functional limits Motor Speech Overall Motor Speech: Impaired Respiration: Within functional limits Phonation: Normal Resonance: Within functional limits Articulation: Within functional limitis Intelligibility: Intelligible Motor Planning: Impaired Level of Impairment: Phrase Motor Speech Errors: Aware   GO          Functional Assessment Tool Used: skilled clinical judgement Functional Limitations: Motor speech Motor Speech Current Status [phone]): At least 40  percent but less than 60 percent impaired, limited or restricted Motor Speech Goal Status [phone]): At least 40 percent but less than 60 percent impaired, limited or restricted Motor Speech Goal  Status [phone]): At least 40 percent but less than 60 percent impaired, limited or restricted         Royce Macadamia 06/11/2016, 4:06 PM   Breck Coons Lonell Face.Ed [employer] [phone]

## 2016-06-11 NOTE — Care Management Note (Signed)
Case Management Note  Patient Details  Name: Amber [name] MRN: [id number] Date of Birth: 05/02/1971  Subjective/Objective:                    Action/Plan: CM consulted for outpatient speech therapy. CM met with the patient and she lives in [place] and wishes to have therapy at [facility] main rehab. Orders placed in EPIC and information on the AVS.   Expected Discharge Date:                  Expected Discharge Plan:  Home/Self Care  In-House Referral:     Discharge planning Services  CM Consult  Post Acute Care Choice:    Choice offered to:     DME Arranged:    DME Agency:     HH Arranged:    [place] Agency:     Status of Service:  Completed, signed off  If discussed at [employer] of Stay Meetings, dates discussed:    Additional Comments:  Pollie Friar, RN 06/11/2016, 3:57 PM

## 2016-06-11 NOTE — Progress Notes (Signed)
STROKE TEAM PROGRESS NOTE   HISTORY OF PRESENT ILLNESS (per record) Amber [name] is an 46 y.o. female with a hx of prior stroke presents with stuttering speech and some word finding difficulty. She was last known well 06/10/16 at 230pm. Patient was not administered IV t-PA secondary to low NIHSS. She was admitted for further evaluation and treatment.   SUBJECTIVE (INTERVAL HISTORY) Her family is at the bedside.  Overall she feels her condition is stable. She does admit to stress at home and sees that it may be contributing to issues today.    OBJECTIVE Temp:  [97.7 F (36.5 C)-98.5 F (36.9 C)] 98.2 F (36.8 C) (01/12 0925) Pulse Rate:  [56-84] 79 (01/12 0925) Cardiac Rhythm: Normal sinus rhythm (01/12 0701) Resp:  [16-19] 18 (01/12 0925) BP: (90-129)/(53-95) 98/64 (01/12 0925) SpO2:  [89 %-100 %] 97 % (01/12 0925) Weight:  [77.6 kg (171 lb 1.2 oz)] 77.6 kg (171 lb 1.2 oz) (01/11 1625)  CBC:  Recent Labs Lab 06/10/16 1636 06/10/16 1646  WBC 4.6  --   NEUTROABS 1.8  --   HGB 14.8 15.3*  HCT 45.3 45.0  MCV 87.5  --   PLT 252  --     Basic Metabolic Panel:  Recent Labs Lab 06/10/16 1636 06/10/16 1646  NA 139 140  K 3.8 3.8  CL 102 102  CO2 27  --   GLUCOSE 100* 99  BUN 13 16  CREATININE 0.96 1.00  CALCIUM 9.8  --     Lipid Panel:    Component Value Date/Time   CHOL 160 06/11/2016 0316   TRIG 68 06/11/2016 0316   HDL 70 06/11/2016 0316   CHOLHDL 2.3 06/11/2016 0316   VLDL 14 06/11/2016 0316   LDLCALC 76 06/11/2016 0316   HgbA1c:  Lab Results  Component Value Date   HGBA1C 6.0 12/15/2015   Urine Drug Screen:    Component Value Date/Time   LABOPIA NONE DETECTED 03/14/2015 0138   COCAINSCRNUR NONE DETECTED 03/14/2015 0138   LABBENZ NONE DETECTED 03/14/2015 0138   AMPHETMU NONE DETECTED 03/14/2015 0138   THCU NONE DETECTED 03/14/2015 0138   LABBARB NONE DETECTED 03/14/2015 0138      IMAGING  Dg Chest 2 View 06/10/2016 No active cardiopulmonary  disease.   Mr Brain Wo Contrast 06/10/2016 No diffusion restriction within the brain to suggest acute or early subacute infarct.   Mr Amber [name] Head/brain Wo Cm 06/10/2016 Normal MRA of the head.  No significant interval change.   Ct Head Code Stroke Wo Contrast` 06/10/2016 1. No acute intracranial abnormality identified. If symptoms persist or if clinically indicated MRI is more sensitive for acute stroke. 2. ASPECTS is 10   2D Echocardiogram  - Left ventricle: The cavity size was normal. Wall thickness was normal. Systolic function was normal. The estimated ejection fraction was in the range of 55% to 60%. Wall motion was normal; there were no regional wall motion abnormalities. Left ventricular diastolic function parameters were normal. - Atrial septum: No defect or patent foramen ovale was identified. Impressions: - No cardiac source of emboli was indentified.   PHYSICAL EXAM Obese middle aged african american lady not in distress.  . Afebrile. Head is nontraumatic. Neck is supple without bruit.    Cardiac exam no murmur or gallop. Lungs are clear to auscultation. Distal pulses are well felt. Neurological Exam ;  Awake  Alert oriented x 3. Bizarre speech with intermittent stuttering and word finding difficulties with parts of sentences being fluent and  other parts hesitation which is improved when she is distracted..eye movements full without nystagmus.fundi were not visualized. Vision acuity and fields appear normal. Hearing is normal. Palatal movements are normal. Face symmetric. Tongue midline. Normal strength, tone, reflexes and coordination. Normal sensation. Gait deferred.  ASSESSMENT/PLAN [name] is a 46 y.o. female with history of asthma, depression, HTN, HLD, IBS and previous stroke presenting with stuttering speech and word finding difficulty. She did not receive IV t-PA due to low NIHSS score.   Stuttering speech, nonorganic. No stroke, no TIA.    MRI  No acute  stroke  MRA  normal  2D Echo  EF 55-60%. No source of embolus   LDL 76  HgbA1c pending  Lovenox 40 mg sq daily for VTE prophylaxis  Diet Heart Room service appropriate? Yes; Fluid consistency: Thin  aspirin 325 mg daily prior to admission, now on aspirin 325 mg daily  Patient counseled to be compliant with her antithrombotic medications  Ongoing aggressive stroke risk factor management  Therapy recommendations:  No PT  Disposition:  Plan return home  add gabapentin 100 tid  Stroke service will sign off  follow up with Dr. Pearlean [name] in 6 weeks  Hypertension  Stable  Long-term BP goal normotensive  Hyperlipidemia  Home meds:  crestor 20, resumed in [name]  LDL 76, goal < 70  Continue statin at discharge  Other Stroke Risk Factors  ETOH use, advised to drink no more than 1 drink(s) a day  Obesity, Body mass index is 31.29 kg/m., recommend weight loss, diet and exercise as appropriate   Hx stroke/TIA  03/2015 left MCA small cortical infarct, embolic pattern secondary to unknown source. TEE and hypercoagulable workup negative  Family hx stroke (PATERNAL GRANDFATHER)  [name] day # 0  [facility]mber [name] [name] See Amion for Pager information 06/11/2016 11:53 AM  I have personally examined this patient, reviewed notes, independently viewed imaging studies, participated in medical decision making and plan of care.ROS completed by me personally and pertinent positives fully documented  I have made any additions or clarifications directly to the above note. Agree with note above. She has presented with 2 weeks history of intermittent paresthesias in her hands with sudden onset of stuttering speech yesterday. MRI shows no acute infarct. Suspect underlying psychosocial stressors contributing to her symptoms. Recommend gabapentin 100 mg 3 times daily to help with paresthesias. Patient counseled to parts. And stress laxation activities. No further stroke  workup is necessary. Long discussion at the bedside with the patient son and niece and answered questions. Greater than 50% time during this 35 minute visit was spent on counseling and coordination of care about stroke risk, stress management and answering questions  Delia Heady, MD Medical Director [facility] Pager: [phone] 06/11/2016 3:23 PM  To contact Stroke Continuity provider, please refer to [email or web address]. After hours, contact General Neurology

## 2016-06-11 NOTE — Discharge Summary (Addendum)
Physician Discharge Summary  Amber [name]  WUJ:[id number]  DOB: December 24, 1970  DOA: 06/10/2016 PCP: Morrie Sheldon, NP  Admit date: 06/10/2016 Discharge date: 06/11/2016  Admitted From: Home Disposition:  Home   Recommendations for Outpatient Follow-up:  1. Follow up with PCP in 1 week 2. Please obtain BMP/CBC in one week 3. Follow up with Dr. Pearlean Brownie in 6 weeks  Discharge Condition: Stable  CODE STATUS: FULL Diet recommendation: Heart Healthy    Brief/Interim Summary: 46 y/o F with PMHx of HTN and stroke in 2016, presented to the ED c/o stuttering and difficulty finding her own words. Patient was placed on obs for stroke work up. MRI negative, MRA normal Neurology was consulted whom suspect that symptoms where due to underlying psychosocial stressors. ECHO was done with no abnormalities. Neurology recommended to start gabapentin and have speech therapy as outpatient. Patient will be d/c home.   Subjective: Patient seen and examined, she reports improvement on her stutter and is speaking better. She has no other complaints. No acute events overnight.   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 If you have any questions about your discharge medications or the care you received while you were in the hospital after you are discharged, you can call the unit and asked to speak with the hospitalist on call if the hospitalist that took care of you is not available. Once you are discharged,  your primary care physician will handle any further medical issues. Please note that NO REFILLS for any discharge medications will be authorized once you are discharged, as it is imperative that you return to your primary care physician (or establish a relationship with a primary care physician if you do not have one) for your aftercare needs so that they can reassess your need for medications and monitor your lab values.  Discharge Instructions    Ambulatory referral to Neurology    Complete by:  As directed    Stroke patient. Dr. Pearlean Brownie prefers follow up in 6 weeks   Ambulatory referral to Speech Therapy    Complete by:  As directed    Ambulatory referral to Speech Therapy    Complete by:  As directed    Call MD for:  difficulty breathing, headache or visual disturbances    Complete by:  As directed    Call MD for:  extreme fatigue    Complete by:  As directed    Call MD for:  hives    Complete by:  As directed    Call MD for:  persistant dizziness or light-headedness    Complete by:  As directed    Call MD for:  persistant nausea and vomiting    Complete by:  As directed    Call MD for:  redness, tenderness, or signs of infection (pain, swelling, redness, odor or green/yellow discharge around incision site)    Complete by:  As directed    Call MD for:  severe uncontrolled  pain    Complete by:  As directed    Call MD for:  temperature >100.4    Complete by:  As directed    Diet - low sodium heart healthy    Complete by:  As directed    Discharge instructions    Complete by:  As directed    Increase activity slowly    Complete by:  As directed      Allergies as of 06/11/2016      Reactions   Keflex [cephalexin] Rash      Medication List    TAKE these medications   aspirin 325 MG tablet Take 1 tablet (325 mg total) by mouth daily.   aspirin-acetaminophen-caffeine 250-250-65 MG tablet Commonly known as:  EXCEDRIN MIGRAINE Take 2 tablets by mouth every 6 (six) hours as  needed for headache or migraine.   docusate sodium 100 MG capsule Commonly known as:  COLACE Take 100 mg by mouth 2 (two) times daily.   hydrochlorothiazide 25 MG tablet Commonly known as:  HYDRODIURIL TAKE ONE TABLET BY MOUTH ONCE DAILY   linaclotide 72 MCG capsule Commonly known as:  LINZESS TAKE ONE CAPSULE BY MOUTH ONCE DAILY BEFORE BREAKFAST   ONE-A-DAY WOMENS FORMULA PO Take 1 tablet by mouth daily with breakfast.   pantoprazole 40 MG tablet Commonly known as:  PROTONIX Take 1 tablet (40 mg total) by mouth daily.   rosuvastatin 20 MG tablet Commonly known as:  CRESTOR Take 1 tablet (20 mg total) by mouth daily.   traZODone 150 MG tablet Commonly known as:  DESYREL TAKE ONE TABLET BY MOUTH AT BEDTIME AS NEEDED FOR SLEEP   vitamin C 500 MG tablet Commonly known as:  ASCORBIC ACID Take 500 mg by mouth daily.   Vitamin D 2000 units Caps Take 1 capsule by mouth daily.      Follow-up Information    SETHI,PRAMOD, MD Follow up in 6 week(s).   Specialties:  Neurology, Radiology Why:  office will call you with appt date and time Contact information: [address] [place] [address] [phone]        [facility] Follow up.   Specialty:  Rehabilitation Why:  They will contact you for the first appointment.  Contact information: [address] [id number] [place] [address] [phone]         Allergies  Allergen Reactions  . Keflex [Cephalexin] Rash    Consultations:  Neurology - Dr Pearlean Brownie    Procedures/Studies: Dg Chest 2 View  Result Date: 06/10/2016 CLINICAL DATA:  Shortness of breath and weakness for 1 day EXAM: CHEST  2 VIEW COMPARISON:  None. FINDINGS: The heart size and mediastinal contours are within normal limits. Both lungs are clear. The visualized skeletal structures are unremarkable. Electronic device overlying the left lower chest. IMPRESSION: No active  cardiopulmonary disease. Electronically Signed   By: Jasmine Pang M.D.   On: 06/10/2016 20:02   Mr Brain Wo Contrast  Result Date: 06/10/2016 CLINICAL DATA:  46 y/o F; history of prior stroke presenting with stuttering speech in word-finding difficulty. EXAM: MRI HEAD WITHOUT CONTRAST TECHNIQUE: Axial and coronal diffusion weighted imaging of the brain was performed. COMPARISON:  06/10/2016 CT head. FINDINGS: No diffusion restriction within the brain to suggest acute or early subacute infarct. The B0 DWI sequence demonstrates small lacunar infarcts within the left cerebellar hemisphere. No abnormal diffusion signal of the skull, paranasal sinuses, or orbits. IMPRESSION: No diffusion restriction within the  brain to suggest acute or early subacute infarct. Electronically Signed   By: Mitzi Hansen M.D.   On: 06/10/2016 18:05   Mr Maxine Glenn Head/brain RV Cm  Result Date: 06/10/2016 CLINICAL DATA:  46 y/o  F; speech difficulty. EXAM: MRA HEAD WITHOUT CONTRAST TECHNIQUE: Angiographic images of the Circle of Willis were obtained using MRA technique without intravenous contrast. COMPARISON:  CT angiogram head and neck dated 03/14/2015. FINDINGS: Internal carotid arteries:  Patent. Anterior cerebral arteries:  Patent. Middle cerebral arteries: Patent. Anterior communicating artery: Patent. Posterior communicating arteries: Not identified, likely hypoplastic or absent. Posterior cerebral arteries:  Patent. Basilar artery:  Patent. Vertebral arteries:  Patent. No evidence of high-grade stenosis, large vessel occlusion, or aneurysm unless noted above. IMPRESSION: Normal MRA of the head.  No significant interval change. Electronically Signed   By: Mitzi Hansen M.D.   On: 06/10/2016 21:04   Ct Head Code Stroke Wo Contrast`  Result Date: 06/10/2016 CLINICAL DATA:  Code stroke.  Slurred speech.  History of stroke. EXAM: CT HEAD WITHOUT CONTRAST TECHNIQUE: Contiguous axial images were obtained from the  base of the skull through the vertex without intravenous contrast. COMPARISON:  CT of the head dated 03/14/2015. FINDINGS: Brain: No evidence of acute infarction, hemorrhage, hydrocephalus, extra-axial collection or mass lesion/mass effect. Stable small lacunar infarcts within the left cerebellar hemisphere. Vascular: No hyperdense vessel or unexpected calcification. Skull: Normal. Negative for fracture or focal lesion. Sinuses/Orbits: No acute finding. Other: None. ASPECTS [facility] Stroke Program Early CT Score) - Ganglionic level infarction (caudate, lentiform nuclei, internal capsule, insula, M1-M3 cortex): 7 - Supraganglionic infarction (M4-M6 cortex): 3 Total score (0-10 with 10 being normal): 10 IMPRESSION: 1. No acute intracranial abnormality identified. If symptoms persist or if clinically indicated MRI is more sensitive for acute stroke. 2. ASPECTS is 10 These results were called by telephone at the time of interpretation on 06/10/2016 at 5:16 pm to Dr. Avon Gully , who verbally acknowledged these results. Electronically Signed   By: Mitzi Hansen M.D.   On: 06/10/2016 17:17    ECHO 06/11/16 ------------------------------------------------------------------- Study Conclusions  - Left ventricle: The cavity size was normal. Wall thickness was   normal. Systolic function was normal. The estimated ejection   fraction was in the range of 55% to 60%. Wall motion was normal;   there were no regional wall motion abnormalities. Left   ventricular diastolic function parameters were normal. - Atrial septum: No defect or patent foramen ovale was identified.  Impressions:  - No cardiac source of emboli was indentified.    Discharge Exam: Vitals:   06/11/16 0925 06/11/16 1442  BP: 98/64 95/63  Pulse: 79 (!) 54  Resp: 18 18  Temp: 98.2 F (36.8 C) 97.8 F (36.6 C)   Vitals:   06/11/16 0400 06/11/16 0621 06/11/16 0925 06/11/16 1442  BP: (!) 90/58 104/60 98/64 95/63   Pulse: (!) 56  60 79 (!) 54  Resp: 16 16 18 18   Temp: 97.9 F (36.6 C) 97.8 F (36.6 C) 98.2 F (36.8 C) 97.8 F (36.6 C)  TempSrc: Oral Oral Oral Oral  SpO2: 98% 97% 97% 98%  Weight:        General: Pt is alert, awake, not in acute distress Cardiovascular: RRR, S1/S2 +, no rubs, no gallops Respiratory: CTA bilaterally, no wheezing, no rhonchi Abdominal: Soft, NT, ND, bowel sounds + Extremities: no edema, no cyanosis Neuro: No focal findings, Bizarre speech with mild stuttering and difficulty finding words, some time improved when distracted  The results of significant diagnostics from this hospitalization (including imaging, microbiology, ancillary and laboratory) are listed below for reference.     Microbiology: No results found for this or any previous visit (from the past 240 hour(s)).   Labs: BNP (last 3 results) No results for input(s): BNP in the last 8760 hours. Basic Metabolic Panel:  Recent Labs Lab 06/10/16 1636 06/10/16 1646  NA 139 140  K 3.8 3.8  CL 102 102  CO2 27  --   GLUCOSE 100* 99  BUN 13 16  CREATININE 0.96 1.00  CALCIUM 9.8  --    Liver Function Tests:  Recent Labs Lab 06/10/16 1636  AST 17  ALT 18  ALKPHOS 65  BILITOT 0.5  PROT 8.1  ALBUMIN 4.1   No results for input(s): LIPASE, AMYLASE in the last 168 hours. No results for input(s): AMMONIA in the last 168 hours. CBC:  Recent Labs Lab 06/10/16 1636 06/10/16 1646  WBC 4.6  --   NEUTROABS 1.8  --   HGB 14.8 15.3*  HCT 45.3 45.0  MCV 87.5  --   PLT 252  --    Cardiac Enzymes: No results for input(s): CKTOTAL, CKMB, CKMBINDEX, TROPONINI in the last 168 hours. BNP: Invalid input(s): POCBNP CBG:  Recent Labs Lab 06/10/16 1645  GLUCAP 70   D-Dimer No results for input(s): DDIMER in the last 72 hours. Hgb A1c No results for input(s): HGBA1C in the last 72 hours. Lipid Profile  Recent Labs  06/11/16 0316  CHOL 160  HDL 70  LDLCALC 76  TRIG 68  CHOLHDL 2.3   Thyroid  function studies No results for input(s): TSH, T4TOTAL, T3FREE, THYROIDAB in the last 72 hours.  Invalid input(s): FREET3 Anemia work up No results for input(s): VITAMINB12, FOLATE, FERRITIN, TIBC, IRON, RETICCTPCT in the last 72 hours. Urinalysis    Component Value Date/Time   COLORURINE YELLOW 03/14/2015 0138   APPEARANCEUR CLEAR 03/14/2015 0138   LABSPEC 1.005 03/14/2015 0138   PHURINE 6.0 03/14/2015 0138   GLUCOSEU NEGATIVE 03/14/2015 0138   HGBUR NEGATIVE 03/14/2015 0138   BILIRUBINUR negative 10/06/2015 1116   KETONESUR NEGATIVE 03/14/2015 0138   PROTEINUR +- 10/06/2015 1116   PROTEINUR NEGATIVE 03/14/2015 0138   UROBILINOGEN 0.2 10/06/2015 1116   UROBILINOGEN 0.2 03/14/2015 0138   NITRITE negative 10/06/2015 1116   NITRITE NEGATIVE 03/14/2015 0138   LEUKOCYTESUR Negative 10/06/2015 1116   Sepsis Labs Invalid input(s): PROCALCITONIN,  WBC,  LACTICIDVEN Microbiology No results found for this or any previous visit (from the past 240 hour(s)).   Time coordinating discharge: Over 30 minutes  SIGNED:  Latrelle Dodrill, MD  [facility] 06/11/2016, 4:37 PM Pager   If 7PM-7AM, please contact night-coverage [email or web address] Password [email or web address]

## 2016-06-12 LAB — HEMOGLOBIN A1C
Hgb A1c MFr Bld: 5.8 % — ABNORMAL HIGH (ref 4.8–5.6)
Mean Plasma Glucose: 120 mg/dL

## 2016-06-15 ENCOUNTER — Encounter: Payer: Self-pay | Admitting: Primary Care

## 2016-06-15 ENCOUNTER — Ambulatory Visit (INDEPENDENT_AMBULATORY_CARE_PROVIDER_SITE_OTHER): Payer: Medicaid Other | Admitting: Primary Care

## 2016-06-15 VITALS — BP 110/76 | HR 100 | Temp 98.1°F | Ht 62.0 in | Wt 171.8 lb

## 2016-06-15 DIAGNOSIS — Z09 Encounter for follow-up examination after completed treatment for conditions other than malignant neoplasm: Secondary | ICD-10-CM | POA: Diagnosis not present

## 2016-06-15 DIAGNOSIS — R7303 Prediabetes: Secondary | ICD-10-CM | POA: Diagnosis not present

## 2016-06-15 DIAGNOSIS — E785 Hyperlipidemia, unspecified: Secondary | ICD-10-CM | POA: Diagnosis not present

## 2016-06-15 DIAGNOSIS — I10 Essential (primary) hypertension: Secondary | ICD-10-CM

## 2016-06-15 NOTE — Assessment & Plan Note (Signed)
At goal on Crestor 20 mg. Continue this and aspirin.

## 2016-06-15 NOTE — Assessment & Plan Note (Signed)
Improved to 5.8 on recent labs, commended her on her efforts, discussed to continue weight loss.

## 2016-06-15 NOTE — Assessment & Plan Note (Signed)
Stable today, continue current regimen. 

## 2016-06-15 NOTE — Progress Notes (Signed)
Subjective:    Patient ID: Amber [name], female    DOB: [birth year], 46 y.o.   MRN: [id number]  HPI  Amber [name] is a 46 year old female with a history of CVA in October 2016 who presents today for hospital follow up.  She presented to [facility] on 06/10/2016 with a chief complaint of aphasia and slurred speech that began several hours before her visit. She also reported body tingling several days prior along with a dry cough for the past 2 weeks.  During her stay in the ED she underwent testing with ECG (sinus rhythm, no significant changes), labs (without significant findings), CT head (normal), MRI (negative). She was admitted for further monitoring and work up.  During her hospitalization she underwent 2D echo with EF of 55-60%, MRA which was unremarkable, labs with prediabetes which is stable, Lipid panel (controlled on statin). She did admit to increased stress at home which could have contributed to symptoms. She was recommended to be discharged home on gabapentin 100 mg TID, outpatient speech therapy, and for close neurology follow up 6 weeks after discharge. She was discharged home on 06/11/16.  Since her discharge home she's doing well. She denies increased weakness, numbness/tingling, further changes in sleep, chest pain, headaches. She is scheduled to see her neurologist within the next month. She is waiting on a call for speech therapy. She thinks she may need to reduce her hours at work given recent events and will be sending paperwork soon.    Review of Systems  Constitutional: Negative for fatigue.  Respiratory: Negative for shortness of breath.   Cardiovascular: Negative for chest pain.  Neurological: Negative for dizziness, numbness and headaches.       No changes in speech       Past Medical History:  Diagnosis Date  . Asthma   . Depression    controlled  . Hyperlipidemia    controlled with medication  . Hypertension   . IBS (irritable bowel syndrome)   . Stroke  (HCC) 03-13-2015  . UTI (lower urinary tract infection)      Social History   Social History  . Marital status: Married    Spouse name: N/A  . Number of children: N/A  . Years of education: N/A   Occupational History  . Not on file.   Social History Main Topics  . Smoking status: Never Smoker  . Smokeless tobacco: Never Used  . Alcohol use 0.0 oz/week     Comment: rarely  . Drug use: No  . Sexual activity: Not on file   Other Topics Concern  . Not on file   Social History Narrative   Originally from [place]   Family lives up here in [place]   Has one daughter.   Enjoys spending time shopping and spending time with family        Past Surgical History:  Procedure Laterality Date  . ABDOMINAL HYSTERECTOMY  10/2006  . bowel reconstruction  10/2006   with hysterectomy  . CESAREAN SECTION  2005  . EP IMPLANTABLE DEVICE N/A 03/17/2015   Procedure: Loop Recorder Insertion;  Surgeon: Will Jorja Loa, MD;  Location: [facility] INVASIVE CV LAB;  Service: Cardiovascular;  Laterality: N/A;  . TEE WITHOUT CARDIOVERSION N/A 03/17/2015   Procedure: TRANSESOPHAGEAL ECHOCARDIOGRAM (TEE);  Surgeon: Chilton Si, MD;  Location: [facility] ENDOSCOPY;  Service: Cardiovascular;  Laterality: N/A;  . TOE SURGERY     Left 2nd metatarsal    Family History  Problem Relation Age  of Onset  . Arthritis Father   . Diabetes Father   . Hypertension Father   . Hyperlipidemia Father   . Hyperlipidemia Mother   . Heart attack Maternal Grandfather   . Stroke Paternal Grandfather     Allergies  Allergen Reactions  . Keflex [Cephalexin] Rash    Current Outpatient Prescriptions on File Prior to Visit  Medication Sig Dispense Refill  . aspirin 325 MG tablet Take 1 tablet (325 mg total) by mouth daily. 60 tablet 0  . aspirin-acetaminophen-caffeine (EXCEDRIN MIGRAINE) 250-250-65 MG tablet Take 2 tablets by mouth every 6 (six) hours as needed for headache or migraine.    . Cholecalciferol  (VITAMIN D) 2000 units CAPS Take 1 capsule by mouth daily.    Marland Kitchen docusate sodium (COLACE) 100 MG capsule Take 100 mg by mouth 2 (two) times daily.    Marland Kitchen gabapentin (NEURONTIN) 100 MG capsule Take 1 capsule (100 mg total) by mouth 3 (three) times daily. 90 capsule 0  . hydrochlorothiazide (HYDRODIURIL) 25 MG tablet TAKE ONE TABLET BY MOUTH ONCE DAILY 90 tablet 1  . linaclotide (LINZESS) 72 MCG capsule TAKE ONE CAPSULE BY MOUTH ONCE DAILY BEFORE BREAKFAST 30 capsule 10  . Multiple Vitamins-Calcium (ONE-A-DAY WOMENS FORMULA PO) Take 1 tablet by mouth daily with breakfast.    . pantoprazole (PROTONIX) 40 MG tablet Take 1 tablet (40 mg total) by mouth daily. 30 tablet 10  . rosuvastatin (CRESTOR) 20 MG tablet Take 1 tablet (20 mg total) by mouth daily. 90 tablet 1  . traZODone (DESYREL) 150 MG tablet TAKE ONE TABLET BY MOUTH AT BEDTIME AS NEEDED FOR SLEEP 90 tablet 0  . vitamin C (ASCORBIC ACID) 500 MG tablet Take 500 mg by mouth daily.     No current facility-administered medications on file prior to visit.     BP 110/76   Pulse 100   Temp 98.1 F (36.7 C) (Oral)   Ht 5\' 2"  (1.575 m)   Wt 171 lb 12.8 oz (77.9 kg)   SpO2 98%   BMI 31.42 kg/m    Objective:   Physical Exam  Constitutional: She is oriented to person, place, and time. She appears well-nourished.  Eyes: EOM are normal. Pupils are equal, round, and reactive to light.  Cardiovascular: Normal rate and regular rhythm.   Pulmonary/Chest: Effort normal and breath sounds normal.  Neurological: She is alert and oriented to person, place, and time. She has normal reflexes. No cranial nerve deficit.  Speech slower, but without slurring. Mild difficulty with word finding.  Skin: Skin is warm and dry.          Assessment & Plan:  Hospital Follow Up:  Presented to [facility] on 06/10/16 for stroke like symptoms. Admitted for further work up and monitor. Hospital work up unremarkable. Doing well, no new symptoms. Discussed to notify  me if she doesn't hear back from speech therapy as this is important for her recovery. Continue medications. Follow up with neurology as discussed. Work on weight loss to reduce blood sugars, this has improved overall.  All hospital notes, imaging, and labs reviewed. Morrie Sheldon, NP

## 2016-06-15 NOTE — Patient Instructions (Signed)
Your labs look good. Continue your efforts towards weight loss through healthy diet.  Please notify me if you've not heard back from speech therapy in 1 week.  Follow up with Dr. Pearlean Brownie as scheduled.  Follow up with me  In 6 months.  It was a pleasure to see you today!

## 2016-06-15 NOTE — Progress Notes (Signed)
Pre visit review using our clinic review tool, if applicable. No additional management support is needed unless otherwise documented below in the visit note. 

## 2016-06-17 ENCOUNTER — Ambulatory Visit: Payer: Medicaid Other | Admitting: Podiatry

## 2016-06-21 ENCOUNTER — Encounter: Payer: Self-pay | Admitting: Primary Care

## 2016-06-22 ENCOUNTER — Ambulatory Visit (INDEPENDENT_AMBULATORY_CARE_PROVIDER_SITE_OTHER): Payer: Medicaid Other | Admitting: Primary Care

## 2016-06-22 ENCOUNTER — Encounter: Payer: Self-pay | Admitting: Primary Care

## 2016-06-22 VITALS — BP 110/78 | HR 84 | Temp 98.0°F | Ht 62.0 in | Wt 173.0 lb

## 2016-06-22 DIAGNOSIS — F411 Generalized anxiety disorder: Secondary | ICD-10-CM | POA: Diagnosis not present

## 2016-06-22 DIAGNOSIS — M5441 Lumbago with sciatica, right side: Secondary | ICD-10-CM | POA: Diagnosis not present

## 2016-06-22 MED ORDER — PREDNISONE 10 MG PO TABS: ORAL_TABLET | ORAL | 0 refills | Status: DC

## 2016-06-22 MED ORDER — CYCLOBENZAPRINE HCL 10 MG PO TABS: 10.0000 mg | ORAL_TABLET | Freq: Three times a day (TID) | ORAL | 0 refills | Status: DC | PRN

## 2016-06-22 NOTE — Progress Notes (Signed)
Subjective:    Patient ID: Amber [name], female    DOB: 11/11/1970, 46 y.o.   MRN: [id number]  HPI  Amber [name] is a 46 year old female who presents today with a chief complaint of back pain. Her pain is located to the right lower back with radiation to her right lower extremity with numbness/tingling. Her pain has been present for the past several weeks. Amber [name] noticed an increase in pain over the last 1 week. Her pain has been waking her at night. Amber [name]'s taken Flexeril from an older prescription with some improvement, but only has three tablets remaining.  2) Insomnia/Anxiety: No improvement with Trazodone 150 mg. Amber [name] will wake 4-5 hours later. Amber [name] has experienced a lot of stress over the last several months. Her mind will race at bedtime often. Amber [name] experiences daily worry, feeling anxious daily, over thinks things, difficulty concentrating. Her symptoms have been present for the past 1 year which has become worse over the past several months. Amber [name] was previously managed on Amitriptyline years ago with improvement.  Review of Systems  Musculoskeletal: Positive for back pain.  Neurological: Positive for numbness. Negative for weakness.  Psychiatric/Behavioral: Positive for sleep disturbance. The patient is nervous/anxious.        Past Medical History:  Diagnosis Date  . Asthma   . Depression    controlled  . Hyperlipidemia    controlled with medication  . Hypertension   . IBS (irritable bowel syndrome)   . Stroke (HCC) 03-13-2015  . UTI (lower urinary tract infection)      Social History   Social History  . Marital status: Married    Spouse name: N/A  . Number of children: N/A  . Years of education: N/A   Occupational History  . Not on file.   Social History Main Topics  . Smoking status: Never Smoker  . Smokeless tobacco: Never Used  . Alcohol use 0.0 oz/week     Comment: rarely  . Drug use: No  . Sexual activity: Not on file   Other Topics Concern  . Not on file     Social History Narrative   Originally from [place]   Family lives up here in [place]   Has one daughter.   Enjoys spending time shopping and spending time with family        Past Surgical History:  Procedure Laterality Date  . ABDOMINAL HYSTERECTOMY  10/2006  . bowel reconstruction  10/2006   with hysterectomy  . CESAREAN SECTION  2005  . EP IMPLANTABLE DEVICE N/A 03/17/2015   Procedure: Loop Recorder Insertion;  Surgeon: Will Jorja Loa, MD;  [facility];  Service: Cardiovascular;  Laterality: N/A;  . TEE WITHOUT CARDIOVERSION N/A 03/17/2015   Procedure: TRANSESOPHAGEAL ECHOCARDIOGRAM (TEE);  Surgeon: Chilton Si, MD;  Location: [facility] ENDOSCOPY;  Service: Cardiovascular;  Laterality: N/A;  . TOE SURGERY     Left 2nd metatarsal    Family History  Problem Relation Age of Onset  . Arthritis Father   . Diabetes Father   . Hypertension Father   . Hyperlipidemia Father   . Hyperlipidemia Mother   . Heart attack Maternal Grandfather   . Stroke Paternal Grandfather     Allergies  Allergen Reactions  . Keflex [Cephalexin] Rash    Current Outpatient Prescriptions on File Prior to Visit  Medication Sig Dispense Refill  . aspirin 325 MG tablet Take 1 tablet (325 mg total) by mouth daily. 60 tablet 0  .  aspirin-acetaminophen-caffeine (EXCEDRIN MIGRAINE) 250-250-65 MG tablet Take 2 tablets by mouth every 6 (six) hours as needed for headache or migraine.    . Cholecalciferol (VITAMIN D) 2000 units CAPS Take 1 capsule by mouth daily.    Marland Kitchen docusate sodium (COLACE) 100 MG capsule Take 100 mg by mouth 2 (two) times daily.    Marland Kitchen gabapentin (NEURONTIN) 100 MG capsule Take 1 capsule (100 mg total) by mouth 3 (three) times daily. 90 capsule 0  . hydrochlorothiazide (HYDRODIURIL) 25 MG tablet TAKE ONE TABLET BY MOUTH ONCE DAILY 90 tablet 1  . linaclotide (LINZESS) 72 MCG capsule TAKE ONE CAPSULE BY MOUTH ONCE DAILY BEFORE BREAKFAST 30 capsule 10  .  Multiple Vitamins-Calcium (ONE-A-DAY WOMENS FORMULA PO) Take 1 tablet by mouth daily with breakfast.    . pantoprazole (PROTONIX) 40 MG tablet Take 1 tablet (40 mg total) by mouth daily. 30 tablet 10  . rosuvastatin (CRESTOR) 20 MG tablet Take 1 tablet (20 mg total) by mouth daily. 90 tablet 1  . traZODone (DESYREL) 150 MG tablet TAKE ONE TABLET BY MOUTH AT BEDTIME AS NEEDED FOR SLEEP 90 tablet 0  . vitamin C (ASCORBIC ACID) 500 MG tablet Take 500 mg by mouth daily.     No current facility-administered medications on file prior to visit.     BP 110/78   Pulse 84   Temp 98 F (36.7 C) (Oral)   Ht 5\' 2"  (1.575 m)   Wt 173 lb (78.5 kg)   SpO2 96%   BMI 31.64 kg/m    Objective:   Physical Exam  Constitutional: Amber [name] appears well-nourished.  Neck: Neck supple.  Cardiovascular: Normal rate and regular rhythm.   Pulmonary/Chest: Effort normal and breath sounds normal.  Musculoskeletal:       Lumbar back: Amber [name] exhibits decreased range of motion and pain.  Positive straight leg raise on right  Skin: Skin is warm and dry.  Psychiatric: Amber [name] has a normal mood and affect.          Assessment & Plan:  Acute Low Back Pain:  Located to mid/right back with radiculopathy to right lower extremity. Exam today consistent for muscle spasm with sciatic nerve involvement. No increased weakness. Given symptoms of radiculopathy with numbness, will treat with steroids and muscle relaxant.  Rx for Flexeril and prednisone taper sent to pharmacy Discussed application of heat, rest. Follow up PRN.  Morrie Sheldon, NP

## 2016-06-22 NOTE — Progress Notes (Signed)
Pre visit review using our clinic review tool, if applicable. No additional management support is needed unless otherwise documented below in the visit note. 

## 2016-06-22 NOTE — Assessment & Plan Note (Signed)
History of anxiety and depression in previous years. Increased anxiety over the last 1+ years given stroke and financial hardships. Discussed that her insomnia is likely secondary to untreated anxiety and recommended treatment. She will be switching to a new PCP due to insurance issues, discussed that her new PCP will need to follow through. She verbalized understanding.

## 2016-06-22 NOTE — Patient Instructions (Signed)
Start prednisone tablets. Take three tablets for 2 days, then two tablets for 2 days, then one tablet for 2 days.  You may take cyclobenzaprine (Flexeril) 10 mg tablets every 8 hours as needed for muscle spasms.  I will completed your short term disability paperwork and notify you once complete.  You would benefit from treatment for anxiety. Discuss this with your new primary care provider.  It was a pleasure to see you today!

## 2016-06-24 ENCOUNTER — Ambulatory Visit: Payer: Medicaid Other | Attending: Family Medicine | Admitting: Speech Pathology

## 2016-06-24 DIAGNOSIS — I69322 Dysarthria following cerebral infarction: Secondary | ICD-10-CM | POA: Diagnosis not present

## 2016-06-24 DIAGNOSIS — R490 Dysphonia: Secondary | ICD-10-CM | POA: Diagnosis present

## 2016-06-24 DIAGNOSIS — R4789 Other speech disturbances: Secondary | ICD-10-CM

## 2016-06-25 ENCOUNTER — Encounter: Payer: Self-pay | Admitting: Speech Pathology

## 2016-06-25 NOTE — Therapy (Signed)
[facility] MAIN [facility] SERVICES [address] [place], [place], [address] Phone: [phone]   Fax:  [phone]  Speech Language Pathology Evaluation  Patient Details  Name: Amber [name] MRN: [id number] Date of Birth: [birth year] Referring Provider: Randel Pigg, EDWIN   Encounter Date: 06/24/2016      End of Session - 06/25/16 1537    Visit Number 1   Number of Visits 9   Date for SLP Re-Evaluation 08/23/16   SLP Start Time 1600   SLP Stop Time  1720   SLP Time Calculation (min) 80 min   Activity Tolerance Patient tolerated treatment well      Past Medical History:  Diagnosis Date  . Asthma   . Depression    controlled  . Hyperlipidemia    controlled with medication  . Hypertension   . IBS (irritable bowel syndrome)   . Stroke (HCC) 03-13-2015  . UTI (lower urinary tract infection)     Past Surgical History:  Procedure Laterality Date  . ABDOMINAL HYSTERECTOMY  10/2006  . bowel reconstruction  10/2006   with hysterectomy  . CESAREAN SECTION  2005  . EP IMPLANTABLE DEVICE N/A 03/17/2015   Procedure: Loop Recorder Insertion;  Surgeon: Will Jorja Loa, MD;  Location: [facility]VAS[facility] CV LAB;  Service: Cardiovascular;  Laterality: N/A;  . TEE WITHOUT CARDIOVERSION N/A 03/17/2015   Procedure: TRANSESOPHAGEAL ECHOCARDIOGRAM (TEE);  Surgeon: Chilton Si, MD;  Location: [facility] ENDOSCOPY;  Service: Cardiovascular;  Laterality: N/A;  . TOE SURGERY     Left 2nd metatarsal    There were no vitals filed for this visit.          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 female admitted for  stuttering and word finding problems. MRI negative. PMH consists of CVA (03/2015), depression and asthma     Prior Functional Status   Cognitive/Linguistic Baseline Within functional limits     Oral Motor/Sensory Function   Overall Oral Motor/Sensory Function Appears within functional limits for tasks assessed     Motor Speech   Overall Motor Speech Impaired   Respiration Impaired   Level of Impairment Conversation   Phonation Hoarse;Breathy;Low vocal intensity   Resonance Within functional limits   Articulation Within functional limitis   Intelligibility Intelligible   Motor Planning Impaired   Level of Impairment Phrase   Motor Speech Errors Aware   Phonation Impaired   Vocal Abuses Glottal Attack   Tension Present Jaw;Neck;Shoulder   Volume Soft   Pitch Appropriate             SLP Education - 06/25/16 1534    Education provided Yes   Education Details 1. Review speech strategies from previous speech therapy. 2. Diaphragmatic breathing.  3. Breath support exercises.  4. Constant flow phonation.  5. Oral resonance.  6. Practice materials.   Person(s) Educated Patient   Methods Explanation;Demonstration;Verbal cues;Handout   Comprehension Verbalized understanding;Returned demonstration;Need further instruction            SLP Long Term Goals - 06/25/16 1541      SLP LONG TERM GOAL #  1   Title Pt will ultize compensations for dysarthria and dysfluency at least 75% of the time during 15 minute simple to mod complex conversation over two sessions   Baseline Pt not using compensations at phrase level   Time 4   Period Weeks   Status New     SLP LONG TERM GOAL #2   Title Pt will demonstrate fluent speech over 8 minute conversation with min A over three sessions   Baseline Pt is dysfluent at phrase/sentence level    Time 4   Period Weeks   Status New          Plan - 06/25/16 1539    Clinical Impression Statement : At 2 weeks post acute onset of stuttering and  difficulty finding her words, this 46 year old woman is presenting with mild dysarthria and neurogenic stuttering characterized by hoarse/strained vocal quality, abrupt onset of aphonic speech, initial sound repetition, and articulatory groping behavior.  The patient was able to significantly improve fluency and vocal quality with cues to use constant flow phonation and oral resonance.  She is not independent and would benefit from skilled speech therapy for restorative and compensatory treatment of dysarthria, dysfluency, and dysphonia.   Speech Therapy Frequency 2x / week   Duration 4 weeks   Treatment/Interventions Compensatory strategies;Patient/family education;SLP instruction and feedback  Fluency therapy, voice therapy   Potential to Achieve Goals Good   Potential Considerations Ability to learn/carryover information;Cooperation/participation level;Previous level of function;Severity of impairments;Family/community support   SLP Home Exercise Plan 1. Review speech strategies from previous speech therapy. 2. Diaphragmatic breathing.  3. Breath support exercises.  4. Constant flow phonation.  5. Oral resonance.  6. Practice materials.   Consulted and Agree with Plan of Care Patient      Patient will benefit from skilled therapeutic intervention in order to improve the following deficits and impairments:   Dysarthria due to recent stroke - Plan: SLP plan of care cert/re-cert  Dysfluency - Plan: SLP plan of care cert/re-cert  Dysphonia - Plan: SLP plan of care cert/re-cert    Problem List Patient Active Problem List   Diagnosis Date Noted  . GAD (generalized anxiety disorder) 06/22/2016  . 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 Low back pain 10/06/2015  . Prediabetes 09/08/2015  . Other fatigue 04/03/2015  . Constipation 03/31/2015  . Dysarthria   . Acute ischemic stroke (HCC) 03/14/2015  . Insomnia 09/29/2014  . IBS (irritable bowel syndrome)  09/29/2014  . Hyperlipidemia 09/29/2014  . Essential hypertension 09/29/2014   Dollene Primrose, MS/CCC- SLP  Leandrew Koyanagi 06/25/2016, 3:46 PM  [facility] MAIN [facility] SERVICES [address] [place], [place], [address] Phone: [phone]   Fax:  [phone]  Name: Amiyrah Masood MRN: [id number] Date of Birth: [birth year]

## 2016-06-29 LAB — CUP PACEART REMOTE DEVICE CHECK
Implantable Pulse Generator Implant Date: 20161017
MDC IDC SESS DTM: 20171212013316

## 2016-06-29 NOTE — Progress Notes (Signed)
Carelink summary report received. Battery status OK. Normal device function. No new symptom episodes, tachy episodes, brady, or pause episodes. No new AF episodes. Monthly summary reports and ROV/PRN 

## 2016-06-30 ENCOUNTER — Telehealth: Payer: Self-pay | Admitting: Primary Care

## 2016-06-30 ENCOUNTER — Telehealth: Payer: Self-pay | Admitting: Neurology

## 2016-06-30 ENCOUNTER — Encounter: Payer: Self-pay | Admitting: *Deleted

## 2016-06-30 NOTE — Telephone Encounter (Signed)
PT was discharge from the hospital this month. Pt was prescribed gabapentin by discharge md. See phone note and advised thanks.

## 2016-06-30 NOTE — Telephone Encounter (Signed)
Pt called requesting a copy of her TB test results from fall 2017. Please call when ready for pick up.

## 2016-06-30 NOTE — Telephone Encounter (Signed)
APT. REMINDER CALL, LMTCB °

## 2016-06-30 NOTE — Telephone Encounter (Signed)
Patient called in reference to gabapentin (NEURONTIN) 100 MG capsule.  Patient states its making her drowsy, nausea, and a slight tingling headache.  Please call

## 2016-06-30 NOTE — Telephone Encounter (Signed)
Printed. Left in front office for patient to pick up.  Notified patient that it is ready for pick up.

## 2016-07-01 ENCOUNTER — Encounter: Payer: Self-pay | Admitting: Podiatry

## 2016-07-01 ENCOUNTER — Ambulatory Visit (INDEPENDENT_AMBULATORY_CARE_PROVIDER_SITE_OTHER): Payer: Medicaid Other | Admitting: Podiatry

## 2016-07-01 ENCOUNTER — Ambulatory Visit (INDEPENDENT_AMBULATORY_CARE_PROVIDER_SITE_OTHER): Payer: Medicaid Other | Admitting: Internal Medicine

## 2016-07-01 ENCOUNTER — Encounter: Payer: Self-pay | Admitting: Internal Medicine

## 2016-07-01 VITALS — BP 119/73 | HR 96 | Temp 98.4°F | Ht 62.0 in | Wt 175.0 lb

## 2016-07-01 VITALS — BP 110/82 | HR 88 | Resp 18

## 2016-07-01 DIAGNOSIS — Z90710 Acquired absence of both cervix and uterus: Secondary | ICD-10-CM | POA: Diagnosis not present

## 2016-07-01 DIAGNOSIS — I69328 Other speech and language deficits following cerebral infarction: Secondary | ICD-10-CM

## 2016-07-01 DIAGNOSIS — Z8249 Family history of ischemic heart disease and other diseases of the circulatory system: Secondary | ICD-10-CM

## 2016-07-01 DIAGNOSIS — M79675 Pain in left toe(s): Secondary | ICD-10-CM

## 2016-07-01 DIAGNOSIS — M79674 Pain in right toe(s): Secondary | ICD-10-CM | POA: Diagnosis not present

## 2016-07-01 DIAGNOSIS — Z823 Family history of stroke: Secondary | ICD-10-CM | POA: Diagnosis not present

## 2016-07-01 DIAGNOSIS — Z8349 Family history of other endocrine, nutritional and metabolic diseases: Secondary | ICD-10-CM

## 2016-07-01 DIAGNOSIS — B351 Tinea unguium: Secondary | ICD-10-CM

## 2016-07-01 DIAGNOSIS — Z95818 Presence of other cardiac implants and grafts: Secondary | ICD-10-CM | POA: Diagnosis not present

## 2016-07-01 DIAGNOSIS — Z8261 Family history of arthritis: Secondary | ICD-10-CM | POA: Diagnosis not present

## 2016-07-01 DIAGNOSIS — Z833 Family history of diabetes mellitus: Secondary | ICD-10-CM

## 2016-07-01 DIAGNOSIS — G4709 Other insomnia: Secondary | ICD-10-CM

## 2016-07-01 MED ORDER — TERBINAFINE HCL 250 MG PO TABS: 250.0000 mg | ORAL_TABLET | Freq: Every day | ORAL | 0 refills | Status: DC

## 2016-07-01 NOTE — Progress Notes (Addendum)
   CC: Insomnia   HPI: [name] is a 46 y.o. with past medical history of asthma, ischemic stroke, and hypertension who presents to clinic to establish care and for follow up of insomnia.   She has always had difficulty with sleeping but noticed that this difficulty sleeping has become worse since she was hospitalized for stroke. The stroke left her with stuttering and slowed speech and she has been taking a leave from work. With this leave form work she has had more down time during the day and has been taking naps, she takes one or two three hour naps per day. She has an evening routine which includes reading, writing, and watching TV with her daughter and usually does these things in bed. She takes trazodone and goes to sleep around 10 pm but lies in bed for an hour and a half trying to fall asleep then wakes up at 3 am thinking of things that she forgot to do throughout the day. 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 She has recently joined a gym. She drinks 2x 7.5 oz mountain dew sodas around noon, does not drink coffee. Drinks a glass of wine about 3 evenings per week.   Please see problem list for status of the pt's chronic medical problems.  Past Medical History:  Diagnosis Date  . Asthma   . Depression    controlled  . Hyperlipidemia    controlled with medication  . Hypertension   . IBS (irritable bowel syndrome)   . Stroke [facility]) 03-13-2015    Review of Systems:  Please see each problem below for a pertinent review of systems.  Physical Exam:  Vitals:   07/01/16 1326  BP: 119/73  Pulse: 96  Temp: 98.4 F (36.9 C)  TempSrc: Oral  SpO2: 100%  Weight: 175 lb (79.4 kg)  Height: 5\' 2"  (1.575 m)   Physical Exam  Constitutional: She is oriented to person, place, and time. She appears well-developed and well-nourished. No distress.  HENT:  Head: Normocephalic and atraumatic.  Eyes: Conjunctivae are normal. No scleral icterus.    Cardiovascular: Normal rate and regular rhythm.   No murmur heard. Pulmonary/Chest: Effort normal. No respiratory distress. She has no wheezes.  Neurological: She is alert and oriented to person, place, and time.  Speech delayed, some stuttering   Skin: Skin is warm and dry. She is not diaphoretic.  Psychiatric: She has a normal mood and affect. Her behavior is normal.   Assessment & Plan:   See Encounters Tab for problem based charting.  Insomnia A large component of this may be related to poor sleep hygiene. We had a long discussion on sleep hygiene and she has decided to work on improvement of this first and expresses that she does not want to start any new medications. Review of records shows she had normal TSH and Vit B12 testing 04/2015.   - continue trazodone  - trial of improved sleep hygiene   Patient discussed with Dr. Criselda Peaches

## 2016-07-01 NOTE — Assessment & Plan Note (Signed)
She has always had difficulty with sleeping but noticed that this difficulty sleeping has become worse since she was hospitalized for stroke. The stroke left her with stuttering and slowed speech and she has been taking a leave from work. With this leave form work she has had more down time during the day and has been taking naps, she takes one or two three hour naps per day. She has an evening routine which includes reading, writing, and watching TV with her daughter and usually does these things in bed. She takes trazodone and goes to sleep around 10 pm but lies in bed for an hour and a half trying to fall asleep then wakes up at 3 am thinking of things that she forgot to do throughout the day. 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 She has recently joined a gym.    A large component of this may be related to poor sleep hygiene. We had a long discussion on sleep hygiene and she has decided to work on improvement of this first and expresses that she does not want to start any new medications. Review of records shows she had normal TSH and Vit B12 testing 04/2015.   - continue trazodone  - trial of improved sleep hygiene

## 2016-07-01 NOTE — Telephone Encounter (Signed)
Patient can discontinue gabapentin however advise her that she was started Flexeril recently by primary care physician and that may also be contributing.

## 2016-07-01 NOTE — Patient Instructions (Addendum)
It was a pleasure to meet you today Amber [name],   For your difficulty sleeping, lets work on your sleep hygiene, you can find a lot of great resources online addressing the things we have talked about.   Schedule a follow up visit to be seen in the clinic in 2 months.   Insomnia Insomnia is a sleep disorder that makes it difficult to fall asleep or to stay asleep. Insomnia can cause tiredness (fatigue), low energy, difficulty concentrating, mood swings, and poor performance at work or school. There are three different ways to classify insomnia:  Difficulty falling asleep.  Difficulty staying asleep.  Waking up too early in the morning. Any type of insomnia can be long-term (chronic) or short-term (acute). Both are common. Short-term insomnia usually lasts for three months or less. Chronic insomnia occurs at least three times a week for longer than three months. What are the causes? Insomnia may be caused by another condition, situation, or substance, such as:  Anxiety.  Certain medicines.  Gastroesophageal reflux disease (GERD) or other gastrointestinal conditions.  Asthma or other breathing conditions.  Restless legs syndrome, sleep apnea, or other sleep disorders.  Chronic pain.  Menopause. This may include hot flashes.  Stroke.  Abuse of alcohol, tobacco, or illegal drugs.  Depression.  Caffeine.  Neurological disorders, such as Alzheimer disease.  An overactive thyroid (hyperthyroidism). The cause of insomnia may not be known. What increases the risk? Risk factors for insomnia include:  Gender. Women are more commonly affected than men.  Age. Insomnia is more common as you get older.  Stress. This may involve your professional or personal life.  Income. Insomnia is more common in people with lower income.  Lack of exercise.  Irregular work schedule or night shifts.  Traveling between different time zones. What are the signs or symptoms? If you have  insomnia, trouble falling asleep or trouble staying asleep is the main symptom. This may lead to other symptoms, such as:  Feeling fatigued.  Feeling nervous about going to sleep.  Not feeling rested in the morning.  Having trouble concentrating.  Feeling irritable, anxious, or depressed. How is this treated? Treatment for insomnia depends on the cause. If your insomnia is caused by an underlying condition, treatment will focus on addressing the condition. Treatment may also include:  Medicines to help you sleep.  Counseling or therapy.  Lifestyle adjustments. Follow these instructions at home:  Take medicines only as directed by your health care provider.  Keep regular sleeping and waking hours. Avoid naps.  Keep a sleep diary to help you and your health care provider figure out what could be causing your insomnia. Include:  When you sleep.  When you wake up during the night.  How well you sleep.  How rested you feel the next day.  Any side effects of medicines you are taking.  What you eat and drink.  Make your bedroom a comfortable place where it is easy to fall asleep:  Put up shades or special blackout curtains to block light from outside.  Use a white noise machine to block noise.  Keep the temperature cool.  Exercise regularly as directed by your health care provider. Avoid exercising right before bedtime.  Use relaxation techniques to manage stress. Ask your health care provider to suggest some techniques that may work well for you. These may include:  Breathing exercises.  Routines to release muscle tension.  Visualizing peaceful scenes.  Cut back on alcohol, caffeinated beverages, and cigarettes, especially  close to bedtime. These can disrupt your sleep.  Do not overeat or eat spicy foods right before bedtime. This can lead to digestive discomfort that can make it hard for you to sleep.  Limit screen use before bedtime. This includes:  Watching  TV.  Using your smartphone, tablet, and computer.  Stick to a routine. This can help you fall asleep faster. Try to do a quiet activity, brush your teeth, and go to bed at the same time each night.  Get out of bed if you are still awake after 15 minutes of trying to sleep. Keep the lights down, but try reading or doing a quiet activity. When you feel sleepy, go back to bed.  Make sure that you drive carefully. Avoid driving if you feel very sleepy.  Keep all follow-up appointments as directed by your health care provider. This is important. Contact a health care provider if:  You are tired throughout the day or have trouble in your daily routine due to sleepiness.  You continue to have sleep problems or your sleep problems get worse. Get help right away if:  You have serious thoughts about hurting yourself or someone else. This information is not intended to replace advice given to you by your health care provider. Make sure you discuss any questions you have with your health care provider. Document Released: 05/14/2000 Document Revised: 10/17/2015 Document Reviewed: 02/15/2014 Elsevier Interactive Patient Education  2017 [employer].

## 2016-07-01 NOTE — Telephone Encounter (Addendum)
  Left vm for patient per Dr.Sethi to discontinue stop taking the gabapentin. Also with her taking flexeril prescribed by the PCP may be contributing to her symptoms.

## 2016-07-02 NOTE — Progress Notes (Signed)
Subjective: Ms. Amonett presents to the office today after almost completing her 2nd month of lamisil. She states that she can tell a difference in her toenails and they are looking better. He states that her nails are thickened elongated however distally to have the nails trimmed today as they are causing irritation and pain in shoe gear. Denies any systemic complaints such as fevers, chills, nausea, vomiting. No acute changes since last appointment, and no other complaints at this time.   Objective: AAO x3, NAD DP/PT pulses palpable bilaterally, CRT less than 3 seconds Nails borders. Dystrophic, discolored, hypertrophic distally how there does appear to be clearing along the proximal nail borders. There is no surrounding redness or drainage. There is tenderness nails 1-5 bilaterally.  No open lesions or pre-ulcerative lesions identified. There is no pain with calf compression, swelling, warmth, erythema.  Assessment: 46 year old female onychomycosis, symptomatic however with improvement   Plan: -Treatment options discussed including all alternatives, risks, and complications -Etiology of symptoms were discussed  -nails were debrided 10 without complications or bleeding. -Continue Lamisil for 30 more days. Continue to monitor side effects which she has not had any. -Follow-up next couple of months after complete Lamisil and with the toenails grow out.   Ovid Curd, DPM

## 2016-07-02 NOTE — Telephone Encounter (Signed)
Rn call patient back about did she get the message about stop taking the gabapentin medication. Pt stated she did get the message and discontinue the gabapentin last night. Her PCP did prescribed the flexeril. The hospital neuro md prescribed the gabapentin. Pt appreciate the phone call and verbalized understanding.Pt will be at her hospital follow up on 07/27/2016.

## 2016-07-06 NOTE — Progress Notes (Signed)
Internal Medicine Clinic Attending  Case discussed with Dr. Obie Dredge soon after the resident saw the patient.  We reviewed the resident's history and exam and pertinent patient test results.  I agree with the assessment, diagnosis, and plan of care documented in the resident's note.  Past Medical History:  Diagnosis Date  . Asthma   . Depression    controlled  . Hyperlipidemia    controlled with medication  . Hypertension   . IBS (irritable bowel syndrome)   . Stroke [facility]) 03-13-2015   Past Surgical History:  Procedure Laterality Date  . ABDOMINAL HYSTERECTOMY  10/2006  . bowel reconstruction  10/2006   with hysterectomy  . CESAREAN SECTION  2005  . EP IMPLANTABLE DEVICE N/A 03/17/2015   Procedure: Loop Recorder Insertion;  Surgeon: Will Jorja Loa, MD;  Location: [facility] INVASIVE CV LAB;  Service: Cardiovascular;  Laterality: N/A;  . TEE WITHOUT CARDIOVERSION N/A 03/17/2015   Procedure: TRANSESOPHAGEAL ECHOCARDIOGRAM (TEE);  Surgeon: Chilton Si, MD;  Location: [facility] ENDOSCOPY;  Service: Cardiovascular;  Laterality: N/A;  . TOE SURGERY     Left 2nd metatarsal   Family History  Problem Relation Age of Onset  . Arthritis Father   . Diabetes Father   . Hypertension Father   . Hyperlipidemia Father   . Hyperlipidemia Mother   . Stroke Paternal Grandmother   . Heart attack Maternal Grandfather   . Hyperlipidemia Maternal Grandfather   . Heart attack Paternal Grandfather    Social History   Social History  . Marital status: Married    Spouse name: N/A  . Number of children: N/A  . Years of education: N/A   Social History Main Topics  . Smoking status: Never Smoker  . Smokeless tobacco: Never Used  . Alcohol use 0.0 oz/week     Comment: rarely  . Drug use: No  . Sexual activity: Not Asked   Other Topics Concern  . None   Social History Narrative   Originally from [place]   Family lives up here in [place]   Has one daughter.   Enjoys spending time  shopping and spending time with family

## 2016-07-09 ENCOUNTER — Ambulatory Visit (INDEPENDENT_AMBULATORY_CARE_PROVIDER_SITE_OTHER): Payer: Medicaid Other | Admitting: *Deleted

## 2016-07-09 DIAGNOSIS — I639 Cerebral infarction, unspecified: Secondary | ICD-10-CM | POA: Diagnosis not present

## 2016-07-12 NOTE — Progress Notes (Signed)
Carelink Summary Report / Loop Recorder 

## 2016-07-15 ENCOUNTER — Ambulatory Visit: Payer: Medicaid Other | Admitting: Speech Pathology

## 2016-07-20 ENCOUNTER — Encounter: Payer: Self-pay | Admitting: Speech Pathology

## 2016-07-20 ENCOUNTER — Ambulatory Visit: Payer: Medicaid Other | Attending: Family Medicine | Admitting: Speech Pathology

## 2016-07-20 DIAGNOSIS — R4789 Other speech disturbances: Secondary | ICD-10-CM

## 2016-07-20 DIAGNOSIS — R490 Dysphonia: Secondary | ICD-10-CM | POA: Diagnosis present

## 2016-07-20 DIAGNOSIS — I69322 Dysarthria following cerebral infarction: Secondary | ICD-10-CM | POA: Insufficient documentation

## 2016-07-20 NOTE — Therapy (Signed)
[place] MAIN [facility] SERVICES [address] [place], [place], [address] Phone: [phone]   Fax:  [phone]  Speech Language Pathology Treatment/Discharge Summary  Patient Details  Name: Amber [name] MRN: [id number] Date of Birth: Dec 06, 1970 Referring Provider: Patrecia Pour, EDWIN   Encounter Date: 07/20/2016      End of Session - 07/20/16 1245    Visit Number 2   Number of Visits 9   Date for SLP Re-Evaluation 08/23/16   SLP Start Time 1010   SLP Stop Time  1100   SLP Time Calculation (min) 50 min   Activity Tolerance Patient tolerated treatment well      Past Medical History:  Diagnosis Date  . Asthma   . Depression    controlled  . Hyperlipidemia    controlled with medication  . Hypertension   . IBS (irritable bowel syndrome)   . Stroke [facility]) 03-13-2015    Past Surgical History:  Procedure Laterality Date  . ABDOMINAL HYSTERECTOMY  10/2006  . bowel reconstruction  10/2006   with hysterectomy  . CESAREAN SECTION  2005  . EP IMPLANTABLE DEVICE N/A 03/17/2015   Procedure: Loop Recorder Insertion;  Surgeon: Will Meredith Leeds, MD;  Location: [place] CV LAB;  Service: Cardiovascular;  Laterality: N/A;  . TEE WITHOUT CARDIOVERSION N/A 03/17/2015   Procedure: TRANSESOPHAGEAL ECHOCARDIOGRAM (TEE);  Surgeon: Skeet Latch, MD;  Location: [facility] ENDOSCOPY;  Service: Cardiovascular;  Laterality: N/A;  . TOE SURGERY     Left 2nd metatarsal    There were no vitals filed for this visit.      Subjective Assessment - 07/20/16 1244    Subjective "I feel great!"   Currently in Pain? No/denies               ADULT SLP TREATMENT - 07/20/16 0001      General Information   Behavior/Cognition Alert;Cooperative;Pleasant mood   HPI Evaluation 06/25/2016: At 2 weeks post acute onset of stuttering and difficulty finding her words, this 46 year old woman is presenting with mild dysarthria and neurogenic stuttering  characterized by hoarse/strained vocal quality, abrupt onset of aphonic speech, initial sound repetition, and articulatory groping behavior.  The patient was able to significantly improve fluency and vocal quality with cues to use constant flow phonation and oral resonance.  She is not independent and would benefit from skilled speech therapy for restorative and compensatory treatment of dysarthria, dysfluency, and dysphonia.     Treatment Provided   Treatment provided Cognitive-Linquistic     Pain Assessment   Pain Assessment No/denies pain     Cognitive-Linquistic Treatment   Treatment focused on Dysarthria;Voice;Other (comment)  Fluency   Skilled Treatment DYSARTHRIA/FLUENCY: Patient independently employed compensatory strategies for dysarthria and dysfluency for 100% of the time during 50 minute complex conversation.       Assessment / Recommendations / Plan   Plan Discharge SLP treatment due to (comment);All goals met     Progression Toward Goals   Progression toward goals Goals met, education completed, patient discharged from [place] Education - 07/20/16 1244    Education provided Yes   Education Details Continue to identify barriers and problem solve functional strategies to overcome barriers   Person(s) Educated Patient   Comprehension Verbalized understanding            SLP Long Term Goals - 07/20/16 1246      SLP LONG TERM GOAL #1   Title  Pt will ultize compensations for dysarthria and dysfluency at least 75% of the time during 15 minute simple to mod complex conversation over two sessions   Baseline Pt not using compensations at phrase level   Time 4   Period Weeks   Status Achieved     SLP LONG TERM GOAL #2   Title Pt will demonstrate fluent speech over 8 minute conversation with min A over three sessions   Baseline Pt is dysfluent at phrase/sentence level    Time 4   Period Weeks   Status Achieved          Plan - 07/20/16 1245    Clinical  Impression Statement In the 3 week interval since evaluation, the patient demonstrates significant improvement is speech intelligibility, fluency, and vocal quality.  Her vocal quality is no longer hoarse/strained, there are no abrupt onsets of aphonic speech, and no articulatory groping behaviors.  She continues to exhibit minor initial sound repetition that is barely noticeable and does not appear to influence her intelligibility or fluency.  The patient has met her goals and is discharged from speech therapy.   Speech Therapy Frequency Other (comment)  Discharge   Duration Other (comment)  Discharge   Treatment/Interventions Compensatory strategies;Patient/family education;SLP instruction and feedback   Potential to Achieve Goals Good   Potential Considerations Ability to learn/carryover information;Cooperation/participation level;Previous level of function;Severity of impairments;Family/community support   SLP Home Exercise Plan 1. Review speech strategies from previous speech therapy. 2. Diaphragmatic breathing.  3. Breath support exercises.  4. Constant flow phonation.  5. Oral resonance.  6. Practice materials.   Consulted and Agree with Plan of Care Patient      Patient will benefit from skilled therapeutic intervention in order to improve the following deficits and impairments:   Dysarthria due to recent stroke  Dysfluency  Dysphonia    Problem List Patient Active Problem List   Diagnosis Date Noted  . GAD (generalized anxiety disorder) 06/22/2016  . 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 Low back pain 10/06/2015  . Prediabetes 09/08/2015  . Other fatigue 04/03/2015  . Dysarthria   . Acute ischemic stroke ([place]) 03/14/2015  . Insomnia 09/29/2014  . IBS (irritable bowel syndrome) 09/29/2014  . Hyperlipidemia 09/29/2014  . Essential hypertension 09/29/2014   Leroy Sea, MS/CCC- SLP  Lou Miner 07/20/2016, 12:47 PM  [place] MAIN [facility] SERVICES [address] [place], [place], [address] Phone: [phone]   Fax:  [phone]   Name: Amber [name] MRN: [id number] Date of Birth: [birth year]

## 2016-07-21 LAB — CUP PACEART REMOTE DEVICE CHECK
Date Time Interrogation Session: 20180110233700
Implantable Pulse Generator Implant Date: 20161017

## 2016-07-26 ENCOUNTER — Ambulatory Visit: Payer: Medicaid Other | Admitting: Speech Pathology

## 2016-07-27 ENCOUNTER — Ambulatory Visit: Payer: Medicaid Other | Admitting: Speech Pathology

## 2016-07-27 ENCOUNTER — Ambulatory Visit (INDEPENDENT_AMBULATORY_CARE_PROVIDER_SITE_OTHER): Payer: Medicaid Other | Admitting: Neurology

## 2016-07-27 ENCOUNTER — Encounter: Payer: Self-pay | Admitting: Neurology

## 2016-07-27 VITALS — BP 103/79 | HR 84 | Wt 173.4 lb

## 2016-07-27 DIAGNOSIS — I639 Cerebral infarction, unspecified: Secondary | ICD-10-CM | POA: Diagnosis not present

## 2016-07-27 DIAGNOSIS — R2990 Unspecified symptoms and signs involving the nervous system: Secondary | ICD-10-CM

## 2016-07-27 NOTE — Patient Instructions (Signed)
I had a long d/w patient about her recent stroke like episode, risk for recurrent stroke/TIAs, personally independently reviewed imaging studies and stroke evaluation results and answered questions.Continue aspirin 325 mg daily  for secondary stroke prevention and maintain strict control of hypertension with blood pressure goal below 130/90, diabetes with hemoglobin A1c goal below 6.5% and lipids with LDL cholesterol goal below 70 mg/dL. I also advised the patient to eat a healthy diet with plenty of whole grains, cereals, fruits and vegetables, exercise regularly and maintain ideal body weight  . No routine scheduled follow up appointment is necessary
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
## 2016-07-29 ENCOUNTER — Ambulatory Visit: Payer: Medicaid Other | Admitting: Speech Pathology

## 2016-08-01 LAB — CUP PACEART REMOTE DEVICE CHECK
Date Time Interrogation Session: 20180209233852
MDC IDC PG IMPLANT DT: 20161017

## 2016-08-01 NOTE — Progress Notes (Signed)
Carelink summary report received. Battery status OK. Normal device function. No new symptom episodes, tachy episodes, brady, or pause episodes. No new AF episodes. Monthly summary reports and ROV/PRN 

## 2016-08-09 ENCOUNTER — Ambulatory Visit (INDEPENDENT_AMBULATORY_CARE_PROVIDER_SITE_OTHER): Payer: Medicaid Other | Admitting: *Deleted

## 2016-08-09 DIAGNOSIS — I639 Cerebral infarction, unspecified: Secondary | ICD-10-CM

## 2016-08-10 NOTE — Progress Notes (Signed)
Carelink Summary Report / Loop Recorder 

## 2016-08-14 LAB — CUP PACEART REMOTE DEVICE CHECK
Implantable Pulse Generator Implant Date: 20161017
MDC IDC SESS DTM: 20180312001016

## 2016-08-14 NOTE — Progress Notes (Signed)
Carelink summary report received. Battery status OK. Normal device function. No new symptom episodes, tachy episodes, brady, or pause episodes. No new AF episodes. Monthly summary reports and ROV/PRN 

## 2016-08-17 ENCOUNTER — Telehealth: Payer: Self-pay | Admitting: Internal Medicine

## 2016-08-17 NOTE — Telephone Encounter (Signed)
Pt states she has had BRB on her stool and on the tissue twice since Sunday. Pt concerned, states she has had hemorrhoids but has not had any issues with them recently. Pt scheduled to see Willette Cluster NP 08/20/16@3pm . Pt aware of appt.

## 2016-08-20 ENCOUNTER — Ambulatory Visit (INDEPENDENT_AMBULATORY_CARE_PROVIDER_SITE_OTHER): Payer: Medicaid Other | Admitting: Nurse Practitioner

## 2016-08-20 ENCOUNTER — Encounter: Payer: Self-pay | Admitting: Nurse Practitioner

## 2016-08-20 VITALS — BP 124/82 | HR 78 | Ht 62.0 in | Wt 173.0 lb

## 2016-08-20 DIAGNOSIS — K648 Other hemorrhoids: Secondary | ICD-10-CM | POA: Diagnosis not present

## 2016-08-20 MED ORDER — HYDROCORTISONE 2.5 % RE CREA: 1.0000 "application " | TOPICAL_CREAM | Freq: Two times a day (BID) | RECTAL | 0 refills | Status: DC

## 2016-08-20 NOTE — Patient Instructions (Addendum)
We have sent the following medications to your pharmacy for you to pick up at your convenience: Anusol cream to use inside rectum twice daily x 7 days.   No straining while on the toilet.   Call our office back in 2 weeks with an update.

## 2016-08-20 NOTE — Progress Notes (Addendum)
     HPI: Patient is a 46 year old female known to Dr. Rhea Belton for constipation predominant IBS. She had a colonoscopy at an outside facility in 2011. The exam was normal except for external hemorrhoids, she is due for repeat colonoscopy at age 62. Patient is here after two recent episodes of painless rectal bleeding. She has soft stools on Linzess, doesn't think she strains. Patient had a stroke in 2016, she takes a daily asa, no other NSAIDs.    Past Medical History:  Diagnosis Date  . Asthma   . Depression    controlled  . Hyperlipidemia    controlled with medication  . Hypertension   . IBS (irritable bowel syndrome)   . Stroke [facility]) 03-13-2015    Patient's surgical history, family medical history, social history, medications and allergies were all reviewed in Epic    Physical Exam: BP 124/82   Pulse 78   Ht 5\' 2"  (1.575 m)   Wt 173 lb (78.5 kg)   SpO2 96%   BMI 31.64 kg/m   GENERAL: well developed black female in NAD PSYCH: :Pleasant, cooperative, normal affect EENT:  conjunctiva pink, mucous membranes moist, neck supple without masses CARDIAC:  RRR, no peripheral edema PULM: Normal respiratory effort ABDOMEN:  soft, nontender, nondistended, no obvious masses, no hepatomegaly,  normal bowel sounds. Old midline surgical scar from c-section.  RECTAL: swollen internal hemorrhoids on anoscopy.  SKIN:  turgor, no lesions seen Musculoskeletal: normal muscle tone, normal strength NEURO: Alert and oriented x 3, no focal neurologic deficits  ASSESSMENT and PLAN:  1. 46 yo female with C-IBS here with two recent episodes of painless rectal bleeding. Stools soft she doesn't strain . She does have inflamed internal hemorrhoids on anoscopy which is most likely cause of bleeding.  -anusol cream BID x 7 days -no straining -call us in 7-10 days with update. If bleeding persists despite treatment then consider colonoscopy to exclude other etiologies of bleeding. Colonoscopy in 2011  was normal except for external hemorrhoids. If repeat colonoscopy done and otherwise normal then may offer banding of internal hemorrhoids at some point.    2. Chronic constipation. Stools soft on Linzess.   Willette Cluster , NP 08/20/2016, 3:21 PM   Addendum: Reviewed and agree with initial management. Recommend screening colon at age 32, and certainly sooner for persistent bleeding Beverley Fiedler, MD

## 2016-08-21 ENCOUNTER — Other Ambulatory Visit: Payer: Self-pay | Admitting: Primary Care

## 2016-08-23 NOTE — Progress Notes (Signed)
CC: Follow up of hypertension   HPI: [name] is a 46 y.o. with past medical history HTN, insomnia, hemorrhoids, IBS-C, HLD, scoliosis, GERD, and ischemic stroke who presents to clinic for follow up of hypertension, insomnia, hemorrhoids, scoliosis, and GERD. She denies new concerns or complaints   Please see problem list for status of the pt's chronic medical problems.  Past Medical History:  Diagnosis Date  . Asthma   . Depression    controlled  . Hyperlipidemia    controlled with medication  . Hypertension   . IBS (irritable bowel syndrome)   . Stroke [facility]) 03-13-2015    Review of Systems:  Please see each problem below for a pertinent review of systems.  Physical Exam:  Vitals:   08/24/16 1331  BP: 115/74  Pulse: 74  Temp: 97.6 F (36.4 C)  TempSrc: Oral  SpO2: 100%  Weight: 175 lb 14.4 oz (79.8 kg)  Height: 5\' 2"  (1.575 m)   Physical Exam  Constitutional: She is oriented to person, place, and time. She appears well-developed and well-nourished. No distress.  Well appearing  HENT:  Head: Normocephalic and atraumatic.  Eyes: Conjunctivae are normal. No scleral icterus.  Neck: Normal range of motion.  Cardiovascular: Normal rate and regular rhythm.   No murmur heard. Pulmonary/Chest: Effort normal. No respiratory distress. She has no wheezes. She has no rales.  Abdominal: Soft. She exhibits no distension. There is no tenderness. There is no guarding.  Musculoskeletal:  Straight leg raise negative   Neurological: She is alert and oriented to person, place, and time.  Skin: Skin is warm and dry. She is not diaphoretic.  Psychiatric: She has a normal mood and affect. Her behavior is normal. Thought content normal.     Assessment & Plan:   See Encounters Tab for problem based charting.  Health maintenance  - HIV screening today  HTN  BP Readings from Last 3 Encounters:  08/20/16 124/82  07/27/16 103/79  07/01/16 119/73  Blood pressure well  controlled today  -refilled HCTZ 25 mg  -BMP today  Insomnia  Reports better sleep hygiene, she is not watching TV and reading in bed anymore and is taking fewer naps. Trazodone and melatonin are helping to supplement good sleep hygiene.  -refilled trazodone   Internal hemorrhoids  She was seen at [facility] for constipation predominant IBS and reported two episodes of painless rectal bleeding. Was found to have internal hemorrhoids on anoscopy. She was prescribed anusol and encouraged to continue linzess for constipation. She has had no further rectal bleeding in the last week.  - continue anusol, linzess, and colace   Levoscoliosis  Reports history of ongoing back pain related to scoliosis. She has pain in b/l lumbar spine which comes and goes about twice per week and is associated with tingling sensation over anterior right thigh. Lumbar spine xray showed levoscoliosis without degenerative change. This has been managed with cyclobenzaprine. She denies alarm symptoms such as incontinence, groin numbness/ tingling, weight change or night sweats. The pain does not limit her performance of work or ADLs. She would like to taper off of some of her medications and is open to trying a more conservative approach to address the back pain.  - Trial of heat, stretching, and NSAIDs/tyelonol for back pain   Gerd  Well controlled with protonix 40 mg daily. She does continue to have heartburn when she forgets to take protonix. She denies melena or abdominal pain.  -refilled protonix   Patient discussed  with Dr. Evette Doffing

## 2016-08-24 ENCOUNTER — Ambulatory Visit (INDEPENDENT_AMBULATORY_CARE_PROVIDER_SITE_OTHER): Payer: Medicaid Other | Admitting: Internal Medicine

## 2016-08-24 ENCOUNTER — Encounter: Payer: Self-pay | Admitting: Internal Medicine

## 2016-08-24 VITALS — BP 115/74 | HR 74 | Temp 97.6°F | Ht 62.0 in | Wt 175.9 lb

## 2016-08-24 DIAGNOSIS — K581 Irritable bowel syndrome with constipation: Secondary | ICD-10-CM

## 2016-08-24 DIAGNOSIS — K649 Unspecified hemorrhoids: Secondary | ICD-10-CM

## 2016-08-24 DIAGNOSIS — I219 Acute myocardial infarction, unspecified: Secondary | ICD-10-CM | POA: Diagnosis not present

## 2016-08-24 DIAGNOSIS — F5101 Primary insomnia: Secondary | ICD-10-CM

## 2016-08-24 DIAGNOSIS — M419 Scoliosis, unspecified: Secondary | ICD-10-CM | POA: Diagnosis not present

## 2016-08-24 DIAGNOSIS — Z79899 Other long term (current) drug therapy: Secondary | ICD-10-CM

## 2016-08-24 DIAGNOSIS — K648 Other hemorrhoids: Secondary | ICD-10-CM | POA: Diagnosis not present

## 2016-08-24 DIAGNOSIS — K219 Gastro-esophageal reflux disease without esophagitis: Secondary | ICD-10-CM | POA: Insufficient documentation

## 2016-08-24 DIAGNOSIS — M4180 Other forms of scoliosis, site unspecified: Secondary | ICD-10-CM

## 2016-08-24 DIAGNOSIS — I10 Essential (primary) hypertension: Secondary | ICD-10-CM

## 2016-08-24 DIAGNOSIS — Z0000 Encounter for general adult medical examination without abnormal findings: Secondary | ICD-10-CM | POA: Insufficient documentation

## 2016-08-24 DIAGNOSIS — G4700 Insomnia, unspecified: Secondary | ICD-10-CM | POA: Diagnosis not present

## 2016-08-24 DIAGNOSIS — I639 Cerebral infarction, unspecified: Secondary | ICD-10-CM

## 2016-08-24 DIAGNOSIS — Z114 Encounter for screening for human immunodeficiency virus [HIV]: Secondary | ICD-10-CM | POA: Insufficient documentation

## 2016-08-24 MED ORDER — TRAZODONE HCL 150 MG PO TABS: 150.0000 mg | ORAL_TABLET | Freq: Every evening | ORAL | 3 refills | Status: DC | PRN

## 2016-08-24 MED ORDER — HYDROCHLOROTHIAZIDE 25 MG PO TABS: 25.0000 mg | ORAL_TABLET | Freq: Every day | ORAL | 6 refills | Status: DC

## 2016-08-24 MED ORDER — PANTOPRAZOLE SODIUM 40 MG PO TBEC: 40.0000 mg | DELAYED_RELEASE_TABLET | Freq: Every day | ORAL | 10 refills | Status: DC

## 2016-08-24 NOTE — Assessment & Plan Note (Signed)
HIV screening today 

## 2016-08-24 NOTE — Assessment & Plan Note (Signed)
Reports history of ongoing back pain related to scoliosis. She has pain in b/l lumbar spine which comes and goes about twice per week and is associated with tingling sensation over anterior right thigh. Lumbar spine xray showed levoscoliosis without degenerative change. This has been managed with cyclobenzaprine. She denies alarm symptoms such as incontinence, groin numbness/ tingling, weight change or night sweats. The pain does not limit her performance of work or ADLs. She would like to taper off of some of her medications and is open to trying a more conservative approach to address the back pain.  - Trial of heat, stretching, and NSAIDs/tyelonol for back pain

## 2016-08-24 NOTE — Assessment & Plan Note (Signed)
She was seen at [facility] for constipation predominant IBS and reported two episodes of painless rectal bleeding. Was found to have internal hemorrhoids on anoscopy. She was prescribed anusol and encouraged to continue linzess for constipation. She has had no further rectal bleeding in the last week.  - continue anusol, linzess, and colace

## 2016-08-24 NOTE — Assessment & Plan Note (Signed)
Well controlled with protonix 40 mg daily. She does continue to have heartburn when she forgets to take protonix. She denies melena or abdominal pain.  -refilled protonix

## 2016-08-24 NOTE — Assessment & Plan Note (Signed)
BP Readings from Last 3 Encounters:  08/20/16 124/82  07/27/16 103/79  07/01/16 119/73  Blood pressure well controlled today  -refilled HCTZ 25 mg  -BMP today

## 2016-08-24 NOTE — Patient Instructions (Addendum)
It was a pleasure to see you today Amber [name],   Keep up the great work with working out in the [employer]  For your back pain - lets switch to management with aleve or tylenol as needed, heat, and stretching.   Please have your gynecologist send your pap smear records to our office.   Please schedule a follow up appointment with me in 6 months.  Call the clinic if your back pain worsens.   Back Exercises The following exercises strengthen the muscles that help to support the back. They also help to keep the lower back flexible. Doing these exercises can help to prevent back pain or lessen existing pain. If you have back pain or discomfort, try doing these exercises 2-3 times each day or as told by your health care provider. When the pain goes away, do them once each day, but increase the number of times that you repeat the steps for each exercise (do more repetitions). If you do not have back pain or discomfort, do these exercises once each day or as told by your health care provider. Exercises Single Knee to Chest   Repeat these steps 3-5 times for each leg: 1. Lie on your back on a firm bed or the floor with your legs extended. 2. Bring one knee to your chest. Your other leg should stay extended and in contact with the floor. 3. Hold your knee in place by grabbing your knee or thigh. 4. Pull on your knee until you feel a gentle stretch in your lower back. 5. Hold the stretch for 10-30 seconds. 6. Slowly release and straighten your leg. Pelvic Tilt   Repeat these steps 5-10 times: 1. Lie on your back on a firm bed or the floor with your legs extended. 2. Bend your knees so they are pointing toward the ceiling and your feet are flat on the floor. 3. Tighten your lower abdominal muscles to press your lower back against the floor. This motion will tilt your pelvis so your tailbone points up toward the ceiling instead of pointing to your feet or the floor. 4. With gentle tension and even  breathing, hold this position for 5-10 seconds. Cat-Cow   Repeat these steps until your lower back becomes more flexible: 1. Get into a hands-and-knees position on a firm surface. Keep your hands under your shoulders, and keep your knees under your hips. You may place padding under your knees for comfort. 2. Let your head hang down, and point your tailbone toward the floor so your lower back becomes rounded like the back of a cat. 3. Hold this position for 5 seconds. 4. Slowly lift your head and point your tailbone up toward the ceiling so your back forms a sagging arch like the back of a cow. 5. Hold this position for 5 seconds. Press-Ups   Repeat these steps 5-10 times: 1. Lie on your abdomen (face-down) on the floor. 2. Place your palms near your head, about shoulder-width apart. 3. While you keep your back as relaxed as possible and keep your hips on the floor, slowly straighten your arms to raise the top half of your body and lift your shoulders. Do not use your back muscles to raise your upper torso. You may adjust the placement of your hands to make yourself more comfortable. 4. Hold this position for 5 seconds while you keep your back relaxed. 5. Slowly return to lying flat on the floor. Bridges   Repeat these steps 10 times: 1.  Lie on your back on a firm surface. 2. Bend your knees so they are pointing toward the ceiling and your feet are flat on the floor. 3. Tighten your buttocks muscles and lift your buttocks off of the floor until your waist is at almost the same height as your knees. You should feel the muscles working in your buttocks and the back of your thighs. If you do not feel these muscles, slide your feet 1-2 inches farther away from your buttocks. 4. Hold this position for 3-5 seconds. 5. Slowly lower your hips to the starting position, and allow your buttocks muscles to relax completely. If this exercise is too easy, try doing it with your arms crossed over your  chest. Abdominal Crunches   Repeat these steps 5-10 times: 1. Lie on your back on a firm bed or the floor with your legs extended. 2. Bend your knees so they are pointing toward the ceiling and your feet are flat on the floor. 3. Cross your arms over your chest. 4. Tip your chin slightly toward your chest without bending your neck. 5. Tighten your abdominal muscles and slowly raise your trunk (torso) high enough to lift your shoulder blades a tiny bit off of the floor. Avoid raising your torso higher than that, because it can put too much stress on your low back and it does not help to strengthen your abdominal muscles. 6. Slowly return to your starting position. Back Lifts  Repeat these steps 5-10 times: 1. Lie on your abdomen (face-down) with your arms at your sides, and rest your forehead on the floor. 2. Tighten the muscles in your legs and your buttocks. 3. Slowly lift your chest off of the floor while you keep your hips pressed to the floor. Keep the back of your head in line with the curve in your back. Your eyes should be looking at the floor. 4. Hold this position for 3-5 seconds. 5. Slowly return to your starting position. Contact a health care provider if:  Your back pain or discomfort gets much worse when you do an exercise.  Your back pain or discomfort does not lessen within 2 hours after you exercise. If you have any of these problems, stop doing these exercises right away. Do not do them again unless your health care provider says that you can. Get help right away if:  You develop sudden, severe back pain. If this happens, stop doing the exercises right away. Do not do them again unless your health care provider says that you can. This information is not intended to replace advice given to you by your health care provider. Make sure you discuss any questions you have with your health care provider. Document Released: 06/24/2004 Document Revised: 09/24/2015 Document  Reviewed: 07/11/2014 Elsevier Interactive Patient Education  2017 [employer] Inc[employer]   Back Pain, Adult Back pain is very common in adults.The cause of back pain is rarely dangerous and the pain often gets better over time.The cause of your back pain may not be known. Some common causes of back pain include:  Strain of the muscles or ligaments supporting the spine.  Wear and tear (degeneration) of the spinal disks.  Arthritis.  Direct injury to the back. For many people, back pain may return. Since back pain is rarely dangerous, most people can learn to manage this condition on their own. Follow these instructions at home: Watch your back pain for any changes. The following actions may help to lessen any discomfort you are  feeling:  Remain active. It is stressful on your back to sit or stand in one place for long periods of time. Do not sit, drive, or stand in one place for more than 30 minutes at a time. Take short walks on even surfaces as soon as you are able.Try to increase the length of time you walk each day.  Exercise regularly as directed by your health care provider. Exercise helps your back heal faster. It also helps avoid future injury by keeping your muscles strong and flexible.  Do not stay in bed.Resting more than 1-2 days can delay your recovery.  Pay attention to your body when you bend and lift. The most comfortable positions are those that put less stress on your recovering back. Always use proper lifting techniques, including:  Bending your knees.  Keeping the load close to your body.  Avoiding twisting.  Find a comfortable position to sleep. Use a firm mattress and lie on your side with your knees slightly bent. If you lie on your back, put a pillow under your knees.  Avoid feeling anxious or stressed.Stress increases muscle tension and can worsen back pain.It is important to recognize when you are anxious or stressed and learn ways to manage it, such as with  exercise.  Take medicines only as directed by your health care provider. Over-the-counter medicines to reduce pain and inflammation are often the most helpful.Your health care provider may prescribe muscle relaxant drugs.These medicines help dull your pain so you can more quickly return to your normal activities and healthy exercise.  Apply ice to the injured area:  Put ice in a plastic bag.  Place a towel between your skin and the bag.  Leave the ice on for 20 minutes, 2-3 times a day for the first 2-3 days. After that, ice and heat may be alternated to reduce pain and spasms.  Maintain a healthy weight. Excess weight puts extra stress on your back and makes it difficult to maintain good posture. Contact a health care provider if:  You have pain that is not relieved with rest or medicine.  You have increasing pain going down into the legs or buttocks.  You have pain that does not improve in one week.  You have night pain.  You lose weight.  You have a fever or chills. Get help right away if:  You develop new bowel or bladder control problems.  You have unusual weakness or numbness in your arms or legs.  You develop nausea or vomiting.  You develop abdominal pain.  You feel faint. This information is not intended to replace advice given to you by your health care provider. Make sure you discuss any questions you have with your health care provider. Document Released: 05/17/2005 Document Revised: 09/25/2015 Document Reviewed: 09/18/2013 Elsevier Interactive Patient Education  2017 [employer].

## 2016-08-24 NOTE — Assessment & Plan Note (Signed)
Reports better sleep hygiene, she is not watching TV and reading in bed anymore and is taking fewer naps. Trazodone and melatonin are helping to supplement good sleep hygiene.  -refilled trazodone

## 2016-08-25 LAB — BMP8+ANION GAP
Anion Gap: 15 mmol/L (ref 10.0–18.0)
BUN/Creatinine Ratio: 14 (ref 9–23)
BUN: 13 mg/dL (ref 6–24)
CALCIUM: 9.6 mg/dL (ref 8.7–10.2)
CHLORIDE: 97 mmol/L (ref 96–106)
CO2: 28 mmol/L (ref 18–29)
Creatinine, Ser: 0.95 mg/dL (ref 0.57–1.00)
GFR, EST AFRICAN AMERICAN: 85 mL/min/{1.73_m2} (ref >59)
GFR, EST NON AFRICAN AMERICAN: 74 mL/min/{1.73_m2} (ref >59)
GLUCOSE: 86 mg/dL (ref 65–99)
Potassium: 4 mmol/L (ref 3.5–5.2)
Sodium: 140 mmol/L (ref 134–144)

## 2016-08-25 LAB — HIV ANTIBODY (ROUTINE TESTING W REFLEX): HIV Screen 4th Generation wRfx: NONREACTIVE

## 2016-08-25 NOTE — Progress Notes (Signed)
Internal Medicine Clinic Attending  Case discussed with Dr. Blum at the time of the visit.  We reviewed the resident's history and exam and pertinent patient test results.  I agree with the assessment, diagnosis, and plan of care documented in the resident's note. 

## 2016-09-07 ENCOUNTER — Ambulatory Visit (INDEPENDENT_AMBULATORY_CARE_PROVIDER_SITE_OTHER): Payer: Medicaid Other | Admitting: *Deleted

## 2016-09-07 DIAGNOSIS — I639 Cerebral infarction, unspecified: Secondary | ICD-10-CM | POA: Diagnosis not present

## 2016-09-08 NOTE — Progress Notes (Signed)
Carelink Summary Report / Loop Recorder 

## 2016-09-15 ENCOUNTER — Other Ambulatory Visit: Payer: Self-pay | Admitting: Primary Care

## 2016-09-15 DIAGNOSIS — E785 Hyperlipidemia, unspecified: Secondary | ICD-10-CM

## 2016-09-17 LAB — CUP PACEART REMOTE DEVICE CHECK
Date Time Interrogation Session: 20180411003511
Implantable Pulse Generator Implant Date: 20161017

## 2016-09-22 ENCOUNTER — Other Ambulatory Visit: Payer: Self-pay | Admitting: Primary Care

## 2016-09-22 DIAGNOSIS — E785 Hyperlipidemia, unspecified: Secondary | ICD-10-CM

## 2016-10-07 ENCOUNTER — Telehealth: Payer: Self-pay | Admitting: *Deleted

## 2016-10-07 ENCOUNTER — Ambulatory Visit (INDEPENDENT_AMBULATORY_CARE_PROVIDER_SITE_OTHER): Payer: Medicaid Other | Admitting: *Deleted

## 2016-10-07 DIAGNOSIS — I639 Cerebral infarction, unspecified: Secondary | ICD-10-CM

## 2016-10-07 NOTE — Telephone Encounter (Signed)
Confirmed address with patient.  Emailed [employer] to request new ID card.  Should arrive 7-10 business days, patient aware.
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
## 2016-10-08 NOTE — Progress Notes (Signed)
Carelink Summary Report / Loop Recorder 

## 2016-10-14 LAB — CUP PACEART REMOTE DEVICE CHECK
Implantable Pulse Generator Implant Date: 20161017
MDC IDC SESS DTM: 20180511024510

## 2016-10-15 ENCOUNTER — Ambulatory Visit (HOSPITAL_COMMUNITY)
Admission: RE | Admit: 2016-10-15 | Discharge: 2016-10-15 | Disposition: A | Discharge status: Home / Self Care | Payer: Medicaid Other | Source: Ambulatory Visit | Attending: Internal Medicine | Admitting: Internal Medicine

## 2016-10-15 ENCOUNTER — Encounter: Payer: Self-pay | Admitting: Internal Medicine

## 2016-10-15 ENCOUNTER — Other Ambulatory Visit: Payer: Self-pay | Admitting: Internal Medicine

## 2016-10-15 ENCOUNTER — Ambulatory Visit (INDEPENDENT_AMBULATORY_CARE_PROVIDER_SITE_OTHER): Payer: Medicaid Other | Admitting: Internal Medicine

## 2016-10-15 ENCOUNTER — Ambulatory Visit (HOSPITAL_COMMUNITY)
Admit: 2016-10-15 | Discharge: 2016-10-15 | Disposition: A | Discharge status: Home / Self Care | Payer: Medicaid Other | Source: Ambulatory Visit | Attending: Internal Medicine | Admitting: Internal Medicine

## 2016-10-15 DIAGNOSIS — M7989 Other specified soft tissue disorders: Secondary | ICD-10-CM | POA: Diagnosis present

## 2016-10-15 DIAGNOSIS — R233 Spontaneous ecchymoses: Secondary | ICD-10-CM | POA: Diagnosis present

## 2016-10-15 DIAGNOSIS — R238 Other skin changes: Secondary | ICD-10-CM

## 2016-10-15 DIAGNOSIS — M79662 Pain in left lower leg: Secondary | ICD-10-CM

## 2016-10-15 DIAGNOSIS — M79605 Pain in left leg: Secondary | ICD-10-CM | POA: Insufficient documentation

## 2016-10-15 NOTE — Patient Instructions (Signed)
It was a pleasure seeing you today. Thank you for choosing [facility] for your healthcare needs.  Continue taking your medications.  I placed an order for an ultrasound of her left thigh and leg. Please ensure you have this done.  For now, continue her aspirin. If her ultrasound is normal we may change her dose from 325 mg to 81 mg.  If your symptoms worsen, your leg swelling worsens or you develop chest pain or shortness of breath please go to the emergency department.

## 2016-10-15 NOTE — Progress Notes (Signed)
   CC: Left Thigh Bruises  HPI: Ms. Katine Camberos is a 46 y.o. female with a past medical history as listed below who presents with 2 seperate right thigh bruises one on the medial aspect of the left thigh just superior to the patella and one on the medial aspect of her left thigh near her groin.   Past Medical History:  Diagnosis Date  . Asthma   . Depression    controlled  . Hyperlipidemia    controlled with medication  . Hypertension   . IBS (irritable bowel syndrome)   . Stroke [facility]) 03-13-2015     Review of Systems: Has tightness in entire left thigh. Denies SOB. Denies n/v ad abdominal pain.    Physical Exam: There were no vitals filed for this visit. BP 118/74 (BP Location: Left Arm, Patient Position: Sitting, Cuff Size: Normal)   Pulse 92   Temp 98.4 F (36.9 C) (Oral)   Ht 5\' 2"  (1.575 m)   Wt 170 lb (77.1 kg)   SpO2 100% Comment: room air  BMI 31.09 kg/m  General appearance: alert and cooperative Lungs: clear to auscultation bilaterally Heart: regular rate and rhythm, S1, S2 normal, no murmur, click, rub or gallop Abdomen: soft, non-tender; bowel sounds normal; no masses,  no organomegaly Extremities: Please see problem based charting for details    Assessment & Plan:  See encounters tab for problem based medical decision making. Patient seen with Dr. Criselda Peaches  Signed: Thomasene Lot, MD 10/15/2016, 9:14 AM  Pager: [phone]

## 2016-10-15 NOTE — Assessment & Plan Note (Addendum)
Patient presents with abnormal bruising for 1 week. She first noticed a large area of ecchymosis on the medial aspect of her left thigh near her groin. She noticed this approximately 7 days ago. 3 days ago she noticed a similar area of ecchymosis on the medial aspect of her left thigh in the suprapatellar region. These seem to be linear in nature as if they're superficial to the saphenous venous system. She has also noticed increased swelling of her left thigh and a feeling of tightness. She has a prior history of stroke and metromenorrhagia making some underlying coagulopathy or bleeding disorder a concern. She is currently on aspirin 325 mg daily. The presence of this antiplatelet could cause easy bruising however the swelling and tightness is a concern. I will send the patient for left lower extremity Doppler ultrasound. I would appreciate if the ultrasound would include the superficial and deep veins of the upper thigh. For now I will continue the patient on aspirin 325 mg daily. However, if her ultrasound is normal we will consider changing this to 81 mg for secondary prevention of stroke. Also, I would strongly consider repeating a hypercoagulable workup after this issue is resolved and the patient is not in any acute inflammatory state. Lastly, I would like [place] ultrasound to be completed today if at all possible given that the weekend is approaching and if her venous system does have a clot and may alter our management in the short-term. -- Doppler ultrasound left thigh -- Continue aspirin, ultrasound normal consider changing to 81 mg daily for secondary prevention of future CVA -- Consider repeat hypercoagulable workup when patient is not in acute inflammatory condition.

## 2016-10-18 NOTE — Progress Notes (Signed)
Internal Medicine Clinic Attending  I saw and evaluated the patient.  I personally confirmed the key portions of the history and exam documented by Dr. Taylor and I reviewed pertinent patient test results.  The assessment, diagnosis, and plan were formulated together and I agree with the documentation in the resident's note.  

## 2016-10-18 NOTE — Progress Notes (Signed)
*  PRELIMINARY RESULTS* Vascular Ultrasound Left lower extremity venous duplex has been completed.  Preliminary findings: No evidence of deep vein thrombosis in the visualized veins of the left lower extremity.  Negative for baker's cyst.   Chauncey Fischer 10/18/2016, 12:34 PM

## 2016-10-20 ENCOUNTER — Other Ambulatory Visit: Payer: Self-pay | Admitting: Primary Care

## 2016-10-20 ENCOUNTER — Telehealth: Payer: Self-pay

## 2016-10-20 DIAGNOSIS — E785 Hyperlipidemia, unspecified: Secondary | ICD-10-CM

## 2016-10-20 NOTE — Telephone Encounter (Signed)
Requesting test results. Please call back.  

## 2016-10-20 NOTE — Telephone Encounter (Signed)
Patient called and informed of her test result. Bruising improved. Swelling slightly better. Doppler ultrasound negative for DVT or Baker's cyst. I advised the patient to continue with symptomatic management over the next week. If the swelling does not improve or worsen she will need to be seen in clinic next week. She endorsed understanding of this plan.

## 2016-10-21 ENCOUNTER — Other Ambulatory Visit: Payer: Self-pay | Admitting: *Deleted

## 2016-10-21 DIAGNOSIS — E785 Hyperlipidemia, unspecified: Secondary | ICD-10-CM

## 2016-10-26 MED ORDER — ROSUVASTATIN CALCIUM 20 MG PO TABS: 20.0000 mg | ORAL_TABLET | Freq: Every day | ORAL | 3 refills | Status: DC

## 2016-11-08 ENCOUNTER — Ambulatory Visit (INDEPENDENT_AMBULATORY_CARE_PROVIDER_SITE_OTHER): Payer: Medicaid Other | Admitting: *Deleted

## 2016-11-08 DIAGNOSIS — I639 Cerebral infarction, unspecified: Secondary | ICD-10-CM

## 2016-11-08 NOTE — Progress Notes (Signed)
Carelink Summary Report / Loop Recorder 

## 2016-11-16 LAB — CUP PACEART REMOTE DEVICE CHECK
Implantable Pulse Generator Implant Date: 20161017
MDC IDC SESS DTM: 20180610013814

## 2016-11-29 ENCOUNTER — Encounter: Payer: Self-pay | Admitting: Internal Medicine

## 2016-11-29 ENCOUNTER — Ambulatory Visit (INDEPENDENT_AMBULATORY_CARE_PROVIDER_SITE_OTHER): Payer: Medicaid Other | Admitting: Internal Medicine

## 2016-11-29 VITALS — BP 108/74 | HR 85 | Temp 97.9°F | Ht 62.0 in | Wt 169.5 lb

## 2016-11-29 DIAGNOSIS — R233 Spontaneous ecchymoses: Secondary | ICD-10-CM

## 2016-11-29 DIAGNOSIS — M79605 Pain in left leg: Secondary | ICD-10-CM | POA: Diagnosis not present

## 2016-11-29 DIAGNOSIS — I10 Essential (primary) hypertension: Secondary | ICD-10-CM

## 2016-11-29 DIAGNOSIS — Z7982 Long term (current) use of aspirin: Secondary | ICD-10-CM

## 2016-11-29 DIAGNOSIS — M79604 Pain in right leg: Secondary | ICD-10-CM | POA: Diagnosis not present

## 2016-11-29 DIAGNOSIS — R238 Other skin changes: Secondary | ICD-10-CM

## 2016-11-29 DIAGNOSIS — Z79899 Other long term (current) drug therapy: Secondary | ICD-10-CM

## 2016-11-29 MED ORDER — ASPIRIN EC 81 MG PO TBEC: 81.0000 mg | DELAYED_RELEASE_TABLET | Freq: Every day | ORAL | 2 refills | Status: AC

## 2016-11-29 MED ORDER — HYDROCHLOROTHIAZIDE 12.5 MG PO TABS
25.0000 mg | ORAL_TABLET | Freq: Every day | ORAL | 0 refills | Status: DC
Start: 2016-11-29 — End: 2017-01-04

## 2016-11-29 NOTE — Progress Notes (Addendum)
   CC: bruising, bilateral leg pain, bilateral leg swelling   HPI:  Amber [name] is a 46 y.o. PMH hypertension, GERD, generalized anxiety, and IBS who presents with ongoing bruising, leg pain and swelling.   Past Medical History:  Diagnosis Date  . Asthma   . Depression    controlled  . Hyperlipidemia    controlled with medication  . Hypertension   . IBS (irritable bowel syndrome)   . Stroke [facility]) 03-13-2015   Review of Systems:  Please review to problem list below for ROS, all others reviewed and negative.   Physical Exam:  Vitals:   11/29/16 0915  BP: 108/74  Pulse: 85  Temp: 97.9 F (36.6 C)  TempSrc: Oral  SpO2: 97%  Weight: 169 lb 8 oz (76.9 kg)  Height: 5\' 2"  (1.575 m)   Physical Exam  Constitutional: She appears well-developed and well-nourished. No distress.  Cardiovascular: Normal rate and regular rhythm.   No murmur heard. Musculoskeletal:  2.5 cm bruise over her right lateral thigh just above the area of her knee  Skin: She is not diaphoretic.     Assessment & Plan:   Leg pain  This leg pain and swelling began in may. Initially this began as a bruise over her left inner thigh. She was evaluated in the internal medicine center and found to have a large area of ecchymosis and increased swelling of the left thigh and a feeling of tightness which seemed to match up with the saphenous venous system so she was sent for lower extremity doppler. Lower extremity Doppler did not reveal deep venous thrombosis or Baker cyst. Her pain had resolved but returned 2 weeks ago. Describes this pain as worse with driving it feels like her left thigh is falling asleep and improves when she "shakes the leg out". She associates this with tenderness over her gluteus. She also has discomfort which is worse at night. She feels like she is up walking around often during the night. On exam she has strength intact and piriformis tenderness, she has 1 small bruise over her leg and  others that are well-healing. I believe this like pain could be associated with restless leg syndrome or piriformis syndrome and that the bruising is an independent finding which could be associated with her high dose aspirin use. She is 2 years out from acute ischemic stroke and based on the literature aspirin 81 mg would be equally effective for secondary stroke prophylaxis with fewer side effects than the higher dose.  -Ordered CBC and ferritin, if this is WNL I will start a trial of gabapentin for RLS as she also has anxiety -Decreased Aspirin 324 mg to 81 mg daily  -Trial of piriformis stretching   Addendum: Serum ferritin level is within the normal limit. I will order a trial of gabapentin 300 mg daily. I called and notified Amber [name] about this. She will follow-up in one month.  Hypertension  Blood pressure is well controlled today and has been on recent blood pressure checks.  BP Readings from Last 3 Encounters:  11/29/16 108/74  10/15/16 118/74  08/24/16 115/74  -Decreased HCTZ to 12.5 mg daily from 25 mg daily    See Encounters Tab for problem based charting.  Patient discussed with Dr. Cleda Daub

## 2016-11-29 NOTE — Patient Instructions (Addendum)
Amber [name] For your bruising Stop taking aspirin 324 mg daily, start taking aspirin 81 mg daily  For the pain in her legs  I'm ordering some labs today, I will call and let you know the results of these and our plan  Your blood pressure was well controlled today,  Is start taking hydrochlorothiazide 12.5 mg daily instead of 25 mg daily   Schedule a follow up appointment in 3 months

## 2016-11-30 LAB — CBC
HEMATOCRIT: 44.2 % (ref 34.0–46.6)
Hemoglobin: 14.7 g/dL (ref 11.1–15.9)
MCH: 28.3 pg (ref 26.6–33.0)
MCHC: 33.3 g/dL (ref 31.5–35.7)
MCV: 85 fL (ref 79–97)
PLATELETS: 272 10*3/uL (ref 150–379)
RBC: 5.19 x10E6/uL (ref 3.77–5.28)
RDW: 13.7 % (ref 12.3–15.4)
WBC: 4.4 10*3/uL (ref 3.4–10.8)

## 2016-11-30 LAB — FERRITIN: Ferritin: 110 ng/mL (ref 15–150)

## 2016-11-30 NOTE — Assessment & Plan Note (Signed)
Blood pressure is well controlled today and has been on recent blood pressure checks.  BP Readings from Last 3 Encounters:  11/29/16 108/74  10/15/16 118/74  08/24/16 115/74  -Decreased HCTZ to 12.5 mg daily from 25 mg daily

## 2016-11-30 NOTE — Assessment & Plan Note (Signed)
This leg pain and swelling began in may. Initially this began as a bruise over her left inner thigh. She was evaluated in the internal medicine center and found to have a large area of ecchymosis and increased swelling of the left thigh and a feeling of tightness which seemed to match up with the saphenous venous system so she was sent for lower extremity doppler. Lower extremity Doppler did not reveal deep venous thrombosis or Baker cyst. Her pain had resolved but returned 2 weeks ago. Describes this pain as worse with driving it feels like her left thigh is falling asleep and improves when she "shakes the leg out". She associates this with tenderness over her gluteus. She also has discomfort which is worse at night. She feels like she is up walking around often during the night. On exam she has strength intact and piriformis tenderness, she has 1 small bruise over her leg and others that are well-healing. I believe this like pain could be associated with restless leg syndrome or piriformis syndrome and that the bruising is an independent finding which could be associated with her high dose aspirin use. She is 2 years out from acute ischemic stroke and based on the literature aspirin 81 mg would be equally effective for secondary stroke prophylaxis with fewer side effects than the higher dose.  -Ordered CBC and ferritin, if this is WNL I will start a trial of gabapentin for RLS as she also has anxiety -Decreased Aspirin 324 mg to 81 mg daily  -Trial of piriformis stretching

## 2016-12-03 MED ORDER — GABAPENTIN 300 MG PO CAPS: 300.0000 mg | ORAL_CAPSULE | Freq: Every day | ORAL | 0 refills | Status: DC

## 2016-12-03 NOTE — Addendum Note (Signed)
Addended by: Earl Lagos on: 12/03/2016 02:08 PM   Modules accepted: Orders

## 2016-12-05 NOTE — Progress Notes (Signed)
Internal Medicine Clinic Attending  Case discussed with Dr. Blum at the time of the visit.  We reviewed the resident's history and exam and pertinent patient test results.  I agree with the assessment, diagnosis, and plan of care documented in the resident's note. 

## 2016-12-06 ENCOUNTER — Ambulatory Visit (INDEPENDENT_AMBULATORY_CARE_PROVIDER_SITE_OTHER): Payer: Medicaid Other | Admitting: *Deleted

## 2016-12-06 DIAGNOSIS — I639 Cerebral infarction, unspecified: Secondary | ICD-10-CM

## 2016-12-07 DIAGNOSIS — L709 Acne, unspecified: Secondary | ICD-10-CM | POA: Diagnosis not present

## 2016-12-07 NOTE — Progress Notes (Signed)
Carelink Summary Report / Loop Recorder 

## 2016-12-13 ENCOUNTER — Ambulatory Visit: Payer: Medicaid Other

## 2016-12-15 LAB — CUP PACEART REMOTE DEVICE CHECK
Date Time Interrogation Session: 20180710021321
MDC IDC PG IMPLANT DT: 20161017

## 2016-12-29 ENCOUNTER — Encounter: Payer: Self-pay | Admitting: Internal Medicine

## 2016-12-29 ENCOUNTER — Ambulatory Visit (INDEPENDENT_AMBULATORY_CARE_PROVIDER_SITE_OTHER): Payer: Medicaid Other | Admitting: Internal Medicine

## 2016-12-29 VITALS — BP 107/74 | HR 79 | Temp 98.2°F | Wt 172.5 lb

## 2016-12-29 DIAGNOSIS — Z79899 Other long term (current) drug therapy: Secondary | ICD-10-CM

## 2016-12-29 DIAGNOSIS — G2581 Restless legs syndrome: Secondary | ICD-10-CM | POA: Insufficient documentation

## 2016-12-29 DIAGNOSIS — F411 Generalized anxiety disorder: Secondary | ICD-10-CM | POA: Diagnosis not present

## 2016-12-29 DIAGNOSIS — R5383 Other fatigue: Secondary | ICD-10-CM | POA: Diagnosis not present

## 2016-12-29 MED ORDER — PRAMIPEXOLE DIHYDROCHLORIDE 0.125 MG PO TABS: 0.1250 mg | ORAL_TABLET | Freq: Every day | ORAL | 2 refills | Status: DC

## 2016-12-29 NOTE — Assessment & Plan Note (Addendum)
Since beginning the Gabapentin her leg pain has improved tremendously and she is now able to sleep throughout the night. However, due to her increasing fatigue that is mostly likely a medication side effect, we made the following changes.   Plan: - STOP Gabapentin 300 mg QHS - START Pramipexole 0.125 mg QHS - Continue to exercise daily - Follow-up in 3 months or PRN if leg pain does not improve

## 2016-12-29 NOTE — Patient Instructions (Signed)
It was a pleasure to meet you today.   - Please STOP taking the Gabapentin.   - Please START taking Mirapex.   - Please continue to stay active and exercise.   Fatigue Fatigue is feeling tired all of the time, a lack of energy, or a lack of motivation. Occasional or mild fatigue is often a normal response to activity or life in general. However, long-lasting (chronic) or extreme fatigue may indicate an underlying medical condition. Follow these instructions at home: Watch your fatigue for any changes. The following actions may help to lessen any discomfort you are feeling:  Talk to your health care provider about how much sleep you need each night. Try to get the required amount every night.  Take medicines only as directed by your health care provider.  Eat a healthy and nutritious diet. Ask your health care provider if you need help changing your diet.  Drink enough fluid to keep your urine clear or pale yellow.  Practice ways of relaxing, such as yoga, meditation, massage therapy, or acupuncture.  Exercise regularly.  Change situations that cause you stress. Try to keep your work and personal routine reasonable.  Do not abuse illegal drugs.  Limit alcohol intake to no more than 1 drink per day for nonpregnant women and 2 drinks per day for men. One drink equals 12 ounces of beer, 5 ounces of wine, or 1 ounces of hard liquor.  Take a multivitamin, if directed by your health care provider.  Contact a health care provider if:  Your fatigue does not get better.  You have a fever.  You have unintentional weight loss or gain.  You have headaches.  You have difficulty: ? Falling asleep. ? Sleeping throughout the night.  You feel angry, guilty, anxious, or sad.  You are unable to have a bowel movement (constipation).  You skin is dry.  Your legs or another part of your body is swollen. Get help right away if:  You feel confused.  Your vision is blurry.  You  feel faint or pass out.  You have a severe headache.  You have severe abdominal, pelvic, or back pain.  You have chest pain, shortness of breath, or an irregular or fast heartbeat.  You are unable to urinate or you urinate less than normal.  You develop abnormal bleeding, such as bleeding from the rectum, vagina, nose, lungs, or nipples.  You vomit blood.  You have thoughts about harming yourself or committing suicide.  You are worried that you might harm someone else. This information is not intended to replace advice given to you by your health care provider. Make sure you discuss any questions you have with your health care provider. Document Released: 03/14/2007 Document Revised: 10/23/2015 Document Reviewed: 09/18/2013 Elsevier Interactive Patient Education  2018 [employer]   Restless Legs Syndrome Restless legs syndrome is a condition that causes uncomfortable feelings or sensations in the legs, especially while sitting or lying down. The sensations usually cause an overwhelming urge to move the legs. The arms can also sometimes be affected. The condition can range from mild to severe. The symptoms often interfere with a person's ability to sleep. What are the causes? The cause of this condition is not known. What increases the risk? This condition is more likely to develop in:  People who are older than age 56.  Pregnant women. In general, restless legs syndrome is more common in women than in men.  People who have a family history of  the condition.  People who have certain medical conditions, such as iron deficiency, kidney disease, Parkinson disease, or nerve damage.  People who take certain medicines, such as medicines for high blood pressure, nausea, colds, allergies, depression, and some heart conditions.  What are the signs or symptoms? The main symptom of this condition is uncomfortable sensations in the legs. These sensations may be:  Described as  pulling, tingling, prickling, throbbing, crawling, or burning.  Worse while you are sitting or lying down.  Worse during periods of rest or inactivity.  Worse at night, often interfering with your sleep.  Accompanied by a very strong urge to move your legs.  Temporarily relieved by movement of your legs.  The sensations usually affect both sides of the body. The arms can also be affected, but this is rare. People who have this condition often have tiredness during the day because of their lack of sleep at night. How is this diagnosed? This condition may be diagnosed based on your description of the symptoms. You may also have tests, including blood tests, to check for other conditions that may lead to your symptoms. In some cases, you may be asked to spend some time in a sleep lab so your sleeping can be monitored. How is this treated? Treatment for this condition is focused on managing the symptoms. Treatment may include:  Self-help and lifestyle changes.  Medicines.  Follow these instructions at home:  Take medicines only as directed by your health care provider.  Try these methods to get temporary relief from the uncomfortable sensations: ? Massage your legs. ? Walk or stretch. ? Take a cold or hot bath.  Practice good sleep habits. For example, go to bed and get up at the same time every day.  Exercise regularly.  Practice ways of relaxing, such as yoga or meditation.  Avoid caffeine and alcohol.  Do not use any tobacco products, including cigarettes, chewing tobacco, or electronic cigarettes. If you need help quitting, ask your health care provider.  Keep all follow-up visits as directed by your health care provider. This is important. Contact a health care provider if: Your symptoms do not improve with treatment, or they get worse. This information is not intended to replace advice given to you by your health care provider. Make sure you discuss any questions you  have with your health care provider. Document Released: 05/07/2002 Document Revised: 10/23/2015 Document Reviewed: 05/13/2014 Elsevier Interactive Patient Education  [employer].

## 2016-12-29 NOTE — Assessment & Plan Note (Addendum)
Fatigue is often multifactorial; however, after reviewing her diet, exercise, sleep, mood, and medication list her increased fatigue is likely related to the initiation of Gabapentin. Although this is a very low dose.  Plan: - STOP Gabapentin 300 mg QHS - START Pramipexole 0.125 mg QHS - Continue to exercise daily - Follow-up in 3 months or PRN if leg pain does not improve

## 2016-12-29 NOTE — Progress Notes (Signed)
   CC: Fatigue  HPI:  [name] is a 46 y.o. significant for GAD and restless leg syndrome who presented to the acute care clinic today for evaluation of fatigue of 3-4 weeks duration. She was recently started on Gabapentin that appears to have made her fatigue much worse. She was started on Gabapentin for Restless Leg Syndrome, which has dramatically improved her symptoms. She is now able to sleep through the night with no pain; however, she believes it is the cause of her excessive daytime fatigue. We discussed that fatigue is often multifactorial and can be related to her diet, sleep, mood, exercise, and medications.   Diet: She denied any recent changes in her diet, including switching to a low carb diet. She eats a balanced diet.   Sleep: She endorses better sleep since starting the Gabapentin with decreased leg aches. However, states that she is more fatigued throughout the day. She has never been told she has sleep apnea and does not snore.   Medications: She is on Gabapentin and Trazodone, both could contribute to increased fatigue/drowsiness; however, she does not take the Trazodone on a daily basis.   Mood: She endorses anxiety with work, but it has not changed recently. She denies depressed mood and review of SIGECAPS is unremarkable for depression.   Exercise: Since seeing Dr. Obie Dredge earlier this month she has started exercising on a daily basis. She walks/runs daily, and does strength training with push-ups and sit-ups.   Metabolic: Denies any symptoms consistent with hypothyroidism including hair loss, constipations, weight gain.   Past Medical History:  Diagnosis Date  . Asthma   . Depression    controlled  . Hyperlipidemia    controlled with medication  . Hypertension   . IBS (irritable bowel syndrome)   . Stroke [facility]) 03-13-2015   Review of Systems:   She denies any skin changes, hair loss, changes in weight, palpitations, or changes in her bowels.   Physical  Exam:  Vitals:   12/29/16 0901  BP: 107/74  Pulse: 79  Temp: 98.2 F (36.8 C)  TempSrc: Oral  SpO2: 98%  Weight: 172 lb 8 oz (78.2 kg)   Physical Exam  Constitutional: She is oriented to person, place, and time. She appears well-developed and well-nourished.  HENT:  Head: Normocephalic and atraumatic.  Cardiovascular: Normal rate, regular rhythm, normal heart sounds and intact distal pulses.   Pulmonary/Chest: Effort normal and breath sounds normal.  Abdominal: Soft. Bowel sounds are normal. There is no tenderness.  Musculoskeletal: She exhibits no edema.  Neurological: She is alert and oriented to person, place, and time.  Psychiatric: She has a normal mood and affect. Her behavior is normal.   Assessment & Plan:   See Encounters Tab for problem based charting.  Patient seen with Dr. Rogelia Boga

## 2016-12-30 NOTE — Progress Notes (Signed)
Internal Medicine Clinic Attending  I saw and evaluated the patient.  I personally confirmed the key portions of the history and exam documented by Dr. Helberg and I reviewed pertinent patient test results.  The assessment, diagnosis, and plan were formulated together and I agree with the documentation in the resident's note. 

## 2017-01-04 ENCOUNTER — Other Ambulatory Visit: Payer: Self-pay | Admitting: Internal Medicine

## 2017-01-05 ENCOUNTER — Ambulatory Visit (INDEPENDENT_AMBULATORY_CARE_PROVIDER_SITE_OTHER): Payer: Medicaid Other | Admitting: *Deleted

## 2017-01-05 DIAGNOSIS — I639 Cerebral infarction, unspecified: Secondary | ICD-10-CM

## 2017-01-06 ENCOUNTER — Other Ambulatory Visit: Payer: Self-pay | Admitting: Internal Medicine

## 2017-01-06 NOTE — Progress Notes (Signed)
Carelink Summary Report / Loop Recorder 

## 2017-01-06 NOTE — Telephone Encounter (Signed)
NEEDS BLOOD PRESSURE MEDICATION REFILLED, Amber [name] [phone]

## 2017-01-07 MED ORDER — HYDROCHLOROTHIAZIDE 12.5 MG PO TABS: 12.5000 mg | ORAL_TABLET | Freq: Every day | ORAL | 1 refills | Status: DC

## 2017-01-13 LAB — CUP PACEART REMOTE DEVICE CHECK
Date Time Interrogation Session: 20180809023918
MDC IDC PG IMPLANT DT: 20161017

## 2017-02-04 ENCOUNTER — Ambulatory Visit (INDEPENDENT_AMBULATORY_CARE_PROVIDER_SITE_OTHER): Payer: Medicaid Other | Admitting: *Deleted

## 2017-02-04 DIAGNOSIS — I639 Cerebral infarction, unspecified: Secondary | ICD-10-CM | POA: Diagnosis not present

## 2017-02-07 NOTE — Progress Notes (Signed)
Carelink Summary Report / Loop Recorder 

## 2017-02-09 LAB — CUP PACEART REMOTE DEVICE CHECK
MDC IDC PG IMPLANT DT: 20161017
MDC IDC SESS DTM: 20180908024058

## 2017-02-13 ENCOUNTER — Emergency Department (HOSPITAL_COMMUNITY): Payer: Medicaid Other

## 2017-02-13 ENCOUNTER — Encounter (HOSPITAL_COMMUNITY): Payer: Self-pay | Admitting: Emergency Medicine

## 2017-02-13 ENCOUNTER — Emergency Department (HOSPITAL_COMMUNITY)
Admission: EM | Admit: 2017-02-13 | Discharge: 2017-02-13 | Disposition: A | Discharge status: Home / Self Care | Payer: Medicaid Other | Attending: Emergency Medicine | Admitting: Emergency Medicine

## 2017-02-13 DIAGNOSIS — Z7982 Long term (current) use of aspirin: Secondary | ICD-10-CM | POA: Diagnosis not present

## 2017-02-13 DIAGNOSIS — J45909 Unspecified asthma, uncomplicated: Secondary | ICD-10-CM | POA: Insufficient documentation

## 2017-02-13 DIAGNOSIS — R1011 Right upper quadrant pain: Secondary | ICD-10-CM | POA: Diagnosis not present

## 2017-02-13 DIAGNOSIS — I10 Essential (primary) hypertension: Secondary | ICD-10-CM | POA: Insufficient documentation

## 2017-02-13 DIAGNOSIS — Z79899 Other long term (current) drug therapy: Secondary | ICD-10-CM | POA: Insufficient documentation

## 2017-02-13 DIAGNOSIS — R079 Chest pain, unspecified: Secondary | ICD-10-CM | POA: Diagnosis not present

## 2017-02-13 DIAGNOSIS — R10811 Right upper quadrant abdominal tenderness: Secondary | ICD-10-CM

## 2017-02-13 LAB — PROTIME-INR
INR: 0.93
PROTHROMBIN TIME: 12.4 s (ref 11.4–15.2)

## 2017-02-13 LAB — URINALYSIS, ROUTINE W REFLEX MICROSCOPIC
BACTERIA UA: NONE SEEN
BILIRUBIN URINE: NEGATIVE
GLUCOSE, UA: NEGATIVE mg/dL
HGB URINE DIPSTICK: NEGATIVE
Ketones, ur: NEGATIVE mg/dL
NITRITE: NEGATIVE
PROTEIN: NEGATIVE mg/dL
Specific Gravity, Urine: 1.012 (ref 1.005–1.030)
pH: 5 (ref 5.0–8.0)

## 2017-02-13 LAB — CBC WITH DIFFERENTIAL/PLATELET
BASOS ABS: 0 10*3/uL (ref 0.0–0.1)
Basophils Relative: 1 %
Eosinophils Absolute: 0.1 10*3/uL (ref 0.0–0.7)
Eosinophils Relative: 1 %
HEMATOCRIT: 41.4 % (ref 36.0–46.0)
HEMOGLOBIN: 13.2 g/dL (ref 12.0–15.0)
LYMPHS PCT: 45 %
Lymphs Abs: 1.7 10*3/uL (ref 0.7–4.0)
MCH: 27.7 pg (ref 26.0–34.0)
MCHC: 31.9 g/dL (ref 30.0–36.0)
MCV: 86.8 fL (ref 78.0–100.0)
Monocytes Absolute: 0.3 10*3/uL (ref 0.1–1.0)
Monocytes Relative: 7 %
NEUTROS ABS: 1.8 10*3/uL (ref 1.7–7.7)
NEUTROS PCT: 46 %
Platelets: 221 10*3/uL (ref 150–400)
RBC: 4.77 MIL/uL (ref 3.87–5.11)
RDW: 13.6 % (ref 11.5–15.5)
WBC: 3.9 10*3/uL — AB (ref 4.0–10.5)

## 2017-02-13 LAB — COMPREHENSIVE METABOLIC PANEL
ALBUMIN: 3.6 g/dL (ref 3.5–5.0)
ALT: 18 U/L (ref 14–54)
ANION GAP: 8 (ref 5–15)
AST: 17 U/L (ref 15–41)
Alkaline Phosphatase: 60 U/L (ref 38–126)
BILIRUBIN TOTAL: 0.4 mg/dL (ref 0.3–1.2)
BUN: 13 mg/dL (ref 6–20)
CO2: 27 mmol/L (ref 22–32)
Calcium: 9.2 mg/dL (ref 8.9–10.3)
Chloride: 104 mmol/L (ref 101–111)
Creatinine, Ser: 1.01 mg/dL — ABNORMAL HIGH (ref 0.44–1.00)
GFR calc Af Amer: >60 mL/min (ref >60)
GLUCOSE: 100 mg/dL — AB (ref 65–99)
POTASSIUM: 3.7 mmol/L (ref 3.5–5.1)
Sodium: 139 mmol/L (ref 135–145)
Total Protein: 6.9 g/dL (ref 6.5–8.1)

## 2017-02-13 LAB — LIPASE, BLOOD: Lipase: 30 U/L (ref 11–51)

## 2017-02-13 LAB — I-STAT TROPONIN, ED
Troponin i, poc: 0.01 ng/mL (ref 0.00–0.08)
Troponin i, poc: 0 ng/mL (ref 0.00–0.08)

## 2017-02-13 LAB — D-DIMER, QUANTITATIVE (NOT AT ARMC): D DIMER QUANT: 0.28 ug{FEU}/mL (ref 0.00–0.50)

## 2017-02-13 MED ORDER — ONDANSETRON HCL 4 MG/2ML IJ SOLN
4.0000 mg | Freq: Once | INTRAMUSCULAR | Status: AC
  Administered 2017-02-13: 4 mg via INTRAVENOUS
  Filled 2017-02-13: qty 2

## 2017-02-13 MED ORDER — MORPHINE SULFATE (PF) 4 MG/ML IV SOLN
4.0000 mg | Freq: Once | INTRAVENOUS | Status: AC
  Administered 2017-02-13: 4 mg via INTRAVENOUS
  Filled 2017-02-13: qty 1

## 2017-02-13 MED ORDER — METHOCARBAMOL 500 MG PO TABS: 500.0000 mg | ORAL_TABLET | Freq: Two times a day (BID) | ORAL | 0 refills | Status: DC

## 2017-02-13 MED ORDER — METHOCARBAMOL 500 MG PO TABS
1000.0000 mg | ORAL_TABLET | Freq: Once | ORAL | Status: AC
  Administered 2017-02-13: 1000 mg via ORAL
  Filled 2017-02-13: qty 2

## 2017-02-13 MED ORDER — SODIUM CHLORIDE 0.9 % IV BOLUS (SEPSIS)
1000.0000 mL | Freq: Once | INTRAVENOUS | Status: AC
  Administered 2017-02-13: 1000 mL via INTRAVENOUS

## 2017-02-13 NOTE — ED Notes (Signed)
Pt verbalized understanding discharge instructions and denies any further needs or questions at this time. VS stable, ambulatory and steady gait.   

## 2017-02-13 NOTE — ED Triage Notes (Addendum)
Per [facility] patient coming from home complaining of right sided chest pain. Patient denies SOB, nausea, or diaphoresis. Pain can be reproduced when palpated. EKG unremarkable with EMS. Patient took 324 aspirin prior to EMS arrival. Patient has had 2 strokes in the past with no deficiets, patient complaining of headache 3/10. Patient alert and oriented x4. Patient also reports a heart monitor that is implanted and supposed to stay for in 3 years

## 2017-02-13 NOTE — ED Provider Notes (Signed)
[facility]-EMERGENCY DEPT Provider Note   CSN: [id number] Arrival date & time: 02/13/17  1017     History   Chief Complaint Chief Complaint  Patient presents with  . Chest Pain    HPI Amber [name] is a 46 y.o. female.  HPI   Amber [name] is a 46 y.o. female, with a history of HTN, stroke, IBS, hyperlipidemia, hysterectomy, and asthma, presenting to the ED with chest pain beginning last night around 10PM. Patient took ibuprofen and then went to sleep. Patient woke up around 7 AM this morning with recurrence of chest pain. Pain is right lateral chest (patient points to right lateral chest and RUQ abdomen), intermittent, lasting for about 5-10 minutes at a time, feels like a "pinching," nonradiating, rated 8/10. States, "The pain takes my breath away." Has this type of pain frequently, but she would like evaluation because she had the pain last night and again this morning.  Took 324 mg ASA prior to arrival to ED. Has a [facility] Cardiac Monitor (Implanted Mar 17 2015, Serial #[id number], Model: [id number]).   Endorses 2 strokes in 2016 affecting her right side and speech, but states she has recovered well and has no lasting deficits. Followed by Pearlean Brownie for neurology.  Denies history of PE/DVT, history of recent surgery, immobilization, or trauma.  Past Medical History:  Diagnosis Date  . Asthma   . Depression    controlled  . Hyperlipidemia    controlled with medication  . Hypertension   . IBS (irritable bowel syndrome)   . Stroke [facility]) 03-13-2015    Patient Active Problem List   Diagnosis Date Noted  . Restless leg syndrome 12/29/2016  . GERD (gastroesophageal reflux disease) 08/24/2016  . Hemorrhoids 08/24/2016  . Healthcare maintenance 08/24/2016  . GAD (generalized anxiety disorder) 06/22/2016  . Levoscoliosis 10/06/2015  . Prediabetes 09/08/2015  . Other fatigue 04/03/2015  . Acute ischemic stroke (HCC) 03/14/2015  . Insomnia 09/29/2014  . IBS (irritable bowel  syndrome) 09/29/2014  . Hyperlipidemia 09/29/2014  . Essential hypertension 09/29/2014    Past Surgical History:  Procedure Laterality Date  . ABDOMINAL HYSTERECTOMY  10/2006  . bowel reconstruction  10/2006   with hysterectomy  . CESAREAN SECTION  2005  . EP IMPLANTABLE DEVICE N/A 03/17/2015   Procedure: Loop Recorder Insertion;  Surgeon: Will Jorja Loa, MD;  Location: [facility] INVASIVE CV LAB;  Service: Cardiovascular;  Laterality: N/A;  . TEE WITHOUT CARDIOVERSION N/A 03/17/2015   Procedure: TRANSESOPHAGEAL ECHOCARDIOGRAM (TEE);  Surgeon: Chilton Si, MD;  Location: [facility] ENDOSCOPY;  Service: Cardiovascular;  Laterality: N/A;  . TOE SURGERY     Left 2nd metatarsal    OB History    Gravida Para Term Preterm AB Living   1 1 0 1 0 1   SAB TAB Ectopic Multiple Live Births   0 0 0 0         Home Medications    Prior to Admission medications   Medication Sig Start Date End Date Taking? Authorizing Provider  aspirin EC 81 MG tablet Take 1 tablet (81 mg total) by mouth daily. 11/29/16 11/29/17 Yes Eulah Pont, MD  docusate sodium (COLACE) 100 MG capsule Take 100 mg by mouth daily.    Yes [provider]  hydrochlorothiazide (HYDRODIURIL) 12.5 MG tablet Take 1 tablet (12.5 mg total) by mouth daily. 01/07/17  Yes Eulah Pont, MD  linaclotide [facility]) 72 MCG capsule TAKE ONE CAPSULE BY MOUTH ONCE DAILY BEFORE BREAKFAST Patient taking differently: Take 72 mcg by mouth  daily before breakfast.  05/13/16  Yes Pyrtle, Carie Caddy, MD  Omega-3 Fatty Acids (FISH OIL PO) Take 1 capsule by mouth daily.   Yes [provider]  pantoprazole (PROTONIX) 40 MG tablet Take 1 tablet (40 mg total) by mouth daily. 08/24/16  Yes Eulah Pont, MD  pramipexole (MIRAPEX) 0.125 MG tablet Take 1 tablet (0.125 mg total) by mouth at bedtime. 12/29/16 12/29/17 Yes Helberg, Jill Alexanders, MD  rosuvastatin (CRESTOR) 20 MG tablet Take 1 tablet (20 mg total) by mouth daily. 10/26/16  Yes Eulah Pont, MD  traZODone (DESYREL)  150 MG tablet Take 1 tablet (150 mg total) by mouth at bedtime as needed. for sleep 08/24/16  Yes Eulah Pont, MD  hydrocortisone (ANUSOL-HC) 2.5 % rectal cream Place 1 application rectally 2 (two) times daily. X 7 days Patient not taking: Reported on 02/13/2017 08/20/16   Meredith Pel, NP  methocarbamol (ROBAXIN) 500 MG tablet Take 1 tablet (500 mg total) by mouth 2 (two) times daily. 02/13/17   Eden Toohey, Hillard Danker, PA-C    Family History Family History  Problem Relation Age of Onset  . Arthritis Father   . Diabetes Father   . Hypertension Father   . Hyperlipidemia Father   . Hyperlipidemia Mother   . Stroke Paternal Grandmother   . Heart attack Maternal Grandfather   . Hyperlipidemia Maternal Grandfather   . Heart attack Paternal Grandfather     Social History Social History  Substance Use Topics  . Smoking status: Never Smoker  . Smokeless tobacco: Never Used  . Alcohol use 1.2 oz/week    2 Glasses of wine per week     Comment: 2 glasses a week      Allergies   Keflex [cephalexin]   Review of Systems Review of Systems  Constitutional: Negative for chills, diaphoresis and fever.  Respiratory: Negative for shortness of breath.   Cardiovascular: Positive for chest pain. Negative for leg swelling.  Gastrointestinal: Positive for diarrhea and nausea. Negative for abdominal pain and vomiting.  Musculoskeletal: Negative for back pain.  Neurological: Negative for dizziness, syncope, weakness, light-headedness and numbness.  All other systems reviewed and are negative.    Physical Exam Updated Vital Signs BP (!) 116/91 (BP Location: Right Arm)   Pulse 71   Temp 98.5 F (36.9 C) (Oral)   Resp 18   Ht 5\' 2"  (1.575 m)   Wt 74.8 kg (165 lb)   SpO2 100%   BMI 30.18 kg/m   Physical Exam  Constitutional: She appears well-developed and well-nourished. No distress.  HENT:  Head: Normocephalic and atraumatic.  Mouth/Throat: Oropharynx is clear and moist.  Eyes: Pupils are  equal, round, and reactive to light. Conjunctivae and EOM are normal.  Neck: Normal range of motion. Neck supple.  Cardiovascular: Normal rate, regular rhythm, normal heart sounds and intact distal pulses.   Pulmonary/Chest: Effort normal and breath sounds normal. No respiratory distress. She exhibits tenderness.    Abdominal: Soft. There is tenderness in the right upper quadrant. There is no guarding.    Musculoskeletal: She exhibits no edema.       Arms: Lymphadenopathy:    She has no cervical adenopathy.  Neurological: She is alert.  No sensory deficits. Strength 5/5 in all extremities. No gait disturbance. Coordination intact including heel to shin and finger to nose. Cranial nerves III-XII grossly intact. No facial droop.   Skin: Skin is warm and dry. She is not diaphoretic.  Psychiatric: She has a normal mood and affect. Her  behavior is normal.  Nursing note and vitals reviewed.    ED Treatments / Results  Labs (all labs ordered are listed, but only abnormal results are displayed) Labs Reviewed  COMPREHENSIVE METABOLIC PANEL - Abnormal; Notable for the following:       Result Value   Glucose, Bld 100 (*)    Creatinine, Ser 1.01 (*)    All other components within normal limits  CBC WITH DIFFERENTIAL/PLATELET - Abnormal; Notable for the following:    WBC 3.9 (*)    All other components within normal limits  URINALYSIS, ROUTINE W REFLEX MICROSCOPIC - Abnormal; Notable for the following:    Leukocytes, UA SMALL (*)    Squamous Epithelial / LPF 0-5 (*)    All other components within normal limits  D-DIMER, QUANTITATIVE (NOT AT [facility])  PROTIME-INR  LIPASE, BLOOD  I-STAT TROPONIN, ED  I-STAT TROPONIN, ED    EKG  EKG Interpretation  Date/Time:  Sunday February 13 2017 10:26:21 EDT Ventricular Rate:  75 PR Interval:    QRS Duration: 78 QT Interval:  376 QTC Calculation: 420 R Axis:     Text Interpretation:  Sinus rhythm Borderline low voltage, extremity leads No  significant change since last tracing Confirmed by Drema Pry [phone]) on 02/13/2017 11:29:38 AM       Radiology Dg Chest 2 View  Result Date: 02/13/2017 CLINICAL DATA:  Right-sided chest pain.  Shortness of breath EXAM: CHEST  2 VIEW COMPARISON:  June 10, 2016 FINDINGS: The heart size and mediastinal contours are within normal limits. Both lungs are clear. The visualized skeletal structures are unremarkable. IMPRESSION: No active cardiopulmonary disease. Electronically Signed   By: Gerome Sam III M.D   On: 02/13/2017 11:54   US Abdomen Limited Ruq  Result Date: 02/13/2017 CLINICAL DATA:  Right upper quadrant tenderness. EXAM: ULTRASOUND ABDOMEN LIMITED RIGHT UPPER QUADRANT COMPARISON:  None. FINDINGS: Gallbladder: No gallstones or wall thickening visualized. No sonographic Murphy sign noted by sonographer. Common bile duct: Diameter: 4.3 mm Liver: The liver is subjectively enlarged. No focal mass. Portal vein is patent on color Doppler imaging with normal direction of blood flow towards the liver. IMPRESSION: 1. The sonographer described the liver as subjectively enlarged. No focal mass. 2. The gallbladder is normal.  No other abnormalities. Electronically Signed   By: Gerome Sam III M.D   On: 02/13/2017 12:00    Procedures Procedures (including critical care time)  Medications Ordered in ED Medications  sodium chloride 0.9 % bolus 1,000 mL (0 mLs Intravenous Stopped 02/13/17 1230)  morphine 4 MG/ML injection 4 mg (4 mg Intravenous Given 02/13/17 1127)  ondansetron (ZOFRAN) injection 4 mg (4 mg Intravenous Given 02/13/17 1127)  methocarbamol (ROBAXIN) tablet 1,000 mg (1,000 mg Oral Given 02/13/17 1233)     Initial Impression / Assessment and Plan / ED Course  I have reviewed the triage vital signs and the nursing notes.  Pertinent labs & imaging results that were available during my care of the patient were reviewed by me and considered in my medical decision making (see  chart for details).  Clinical Course as of Feb 13 1725  Wynelle Link Feb 13, 2017  1216 Lab results discussed with patient. Currently rates her pain at 5/10.   [SJ]  1330 Patient states that her pain has resolved.  [SJ]    Clinical Course User Index [SJ] Arvel Oquinn C, PA-C    Patient presents with intermittent right lateral chest pain. Reproducible on exam. Low suspicion for ACS. HEART score  is 2, indicating low risk for a cardiac event. Wells criteria score is 0, indicating low risk for PE. Tenderness into the right lower anterior chest and question of tenderness in right upper quadrant. No evidence of gallbladder abnormality on ultrasound. Lab results encouraging. Delta troponins negative. D-dimer negative. PCP and neurology follow-up. The patient was given instructions for home care as well as return precautions. Patient voices understanding of these instructions, accepts the plan, and is comfortable with discharge.  Findings and plan of care discussed with Drema Pry, MD.   Vitals:   02/13/17 1230 02/13/17 1330 02/13/17 1500 02/13/17 1600  BP: 115/87 103/74 113/75 104/84  Pulse: 69 72 60 66  Resp: Temp:      TempSrc:      SpO2: 99% 97% 100% 95%  Weight:      Height:        Final Clinical Impressions(s) / ED Diagnoses   Final diagnoses:  RUQ abdominal tenderness  Chest pain, unspecified type    New Prescriptions Discharge Medication List as of 02/13/2017  4:25 PM    START taking these medications   Details  methocarbamol (ROBAXIN) 500 MG tablet Take 1 tablet (500 mg total) by mouth 2 (two) times daily., Starting Sun 02/13/2017, Print         Jetty Berland C, PA-C 02/13/17 1727    Nira Conn, MD 02/15/17 [phone]

## 2017-02-13 NOTE — Discharge Instructions (Signed)
There were no significant, acute abnormalities found on the labs or imaging today. May use ibuprofen, naproxen, or Tylenol for pain. Robaxin is a muscle relaxer may help with spasming muscles. Do not drive or perform other dangerous activities while taking the Robaxin. Follow-up with your neurologist on this issue. Please also follow-up with your primary care provider. Return to the ED as needed.

## 2017-02-16 IMAGING — MR MR MRA HEAD W/O CM
1 series · 20 of 48 positions shown · non-contrast
Comparison: CT angiogram head and neck dated 03/14/2015.

CLINICAL DATA: 45 y/o  F; speech difficulty.

EXAM:
MRA HEAD WITHOUT CONTRAST
TECHNIQUE: Angiographic images of the Circle of Willis were obtained using MRA
technique without intravenous contrast.

[Series 4: (id) mt fs · axial · 1.4mm · 0.43mm/px · z∈[-53,+35]mm · 20 of 136 slices shown]
[im 1/136]
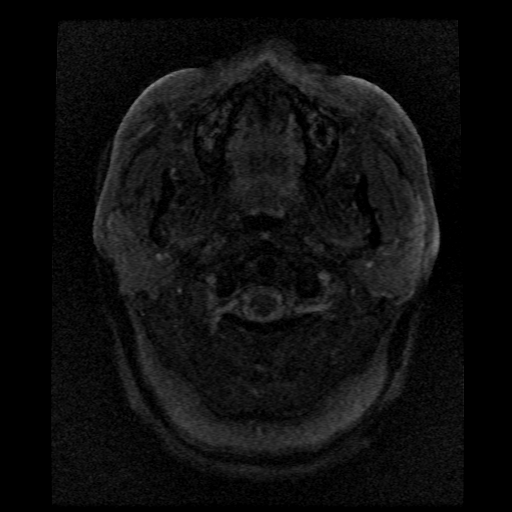
[im 3/136]
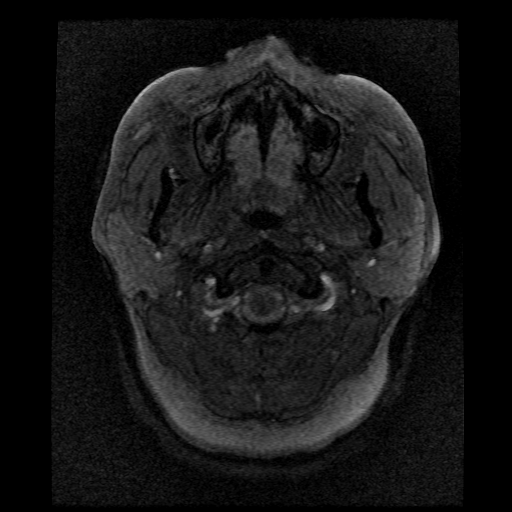
[im 6/136]
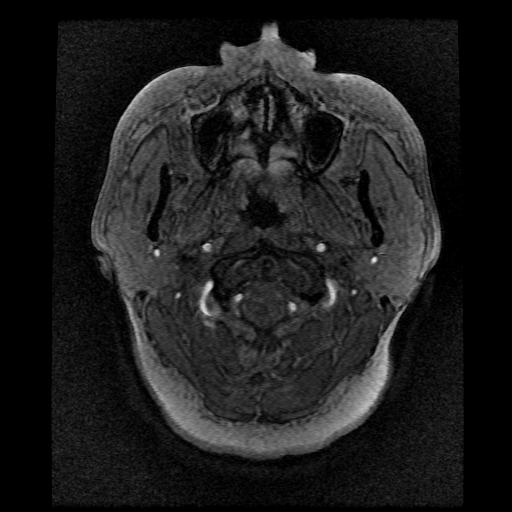
[im 9/136]
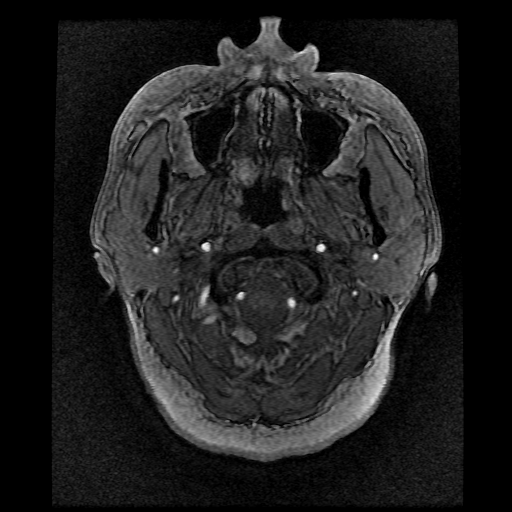
[im 12/136]
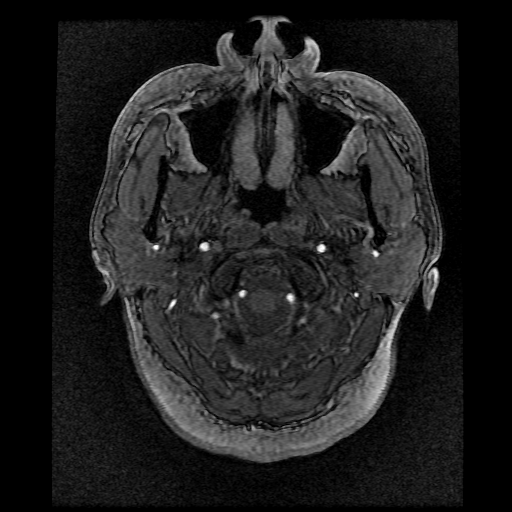
[im 15/136]
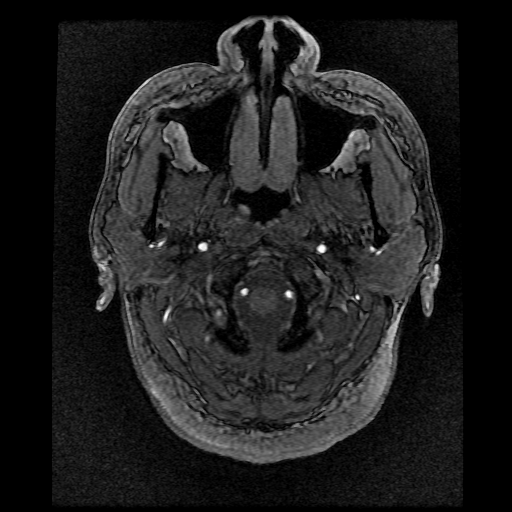
[im 18/136]
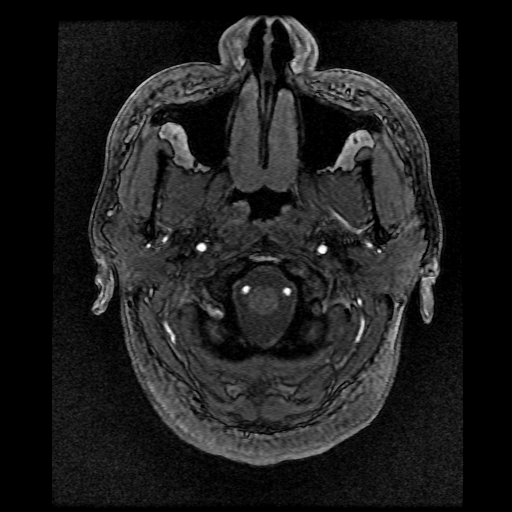
[im 21/136]
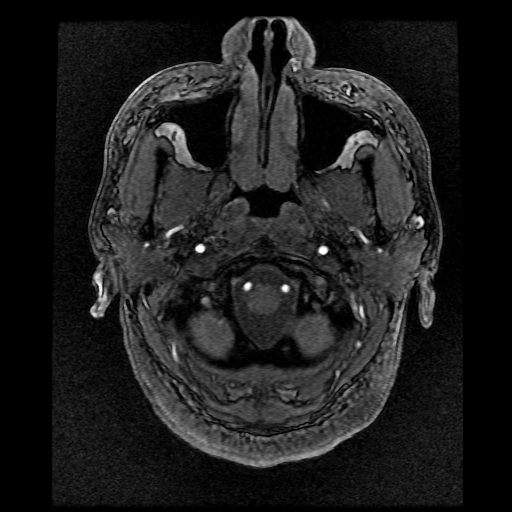
[im 23/136]
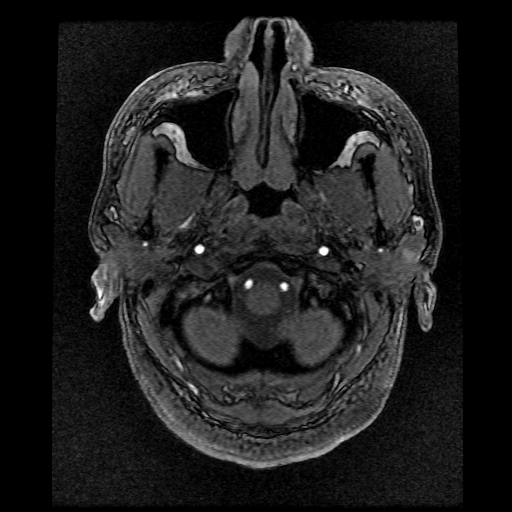
[im 26/136]
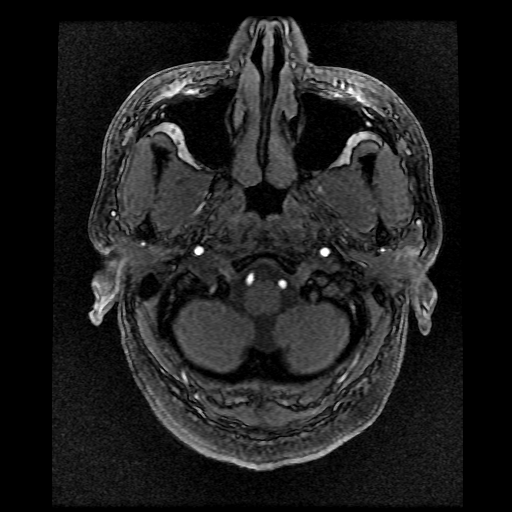
[im 29/136]
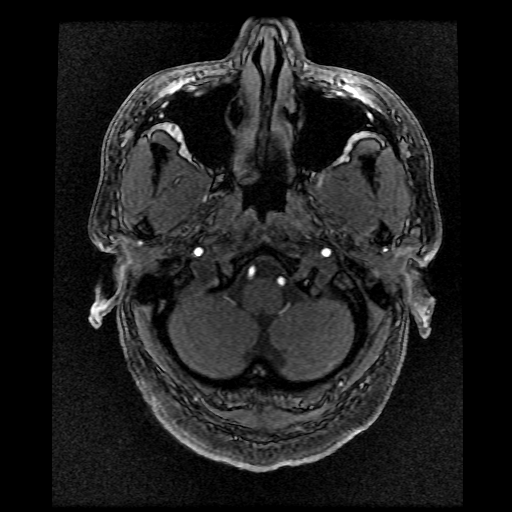
[im 32/136]
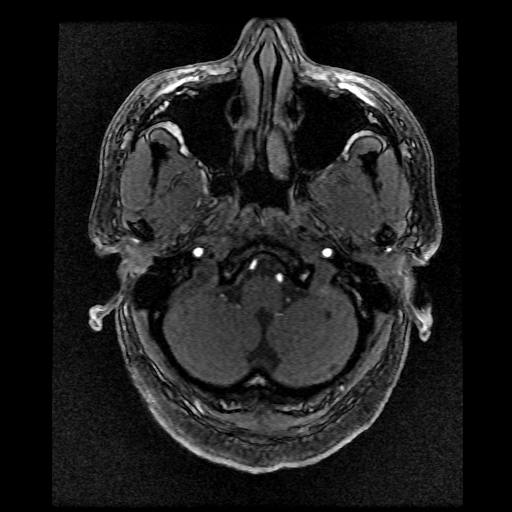
[im 44/136]
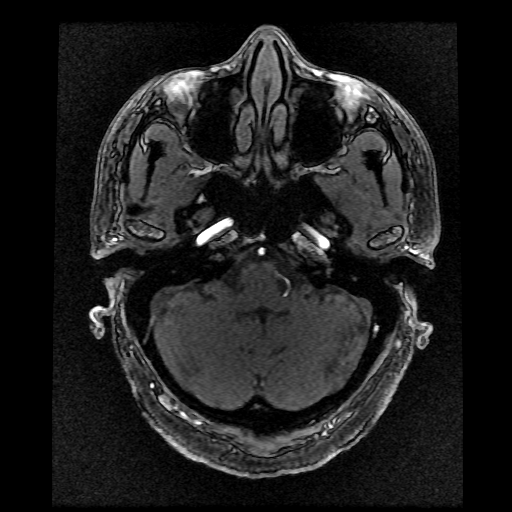
[im 61/136]
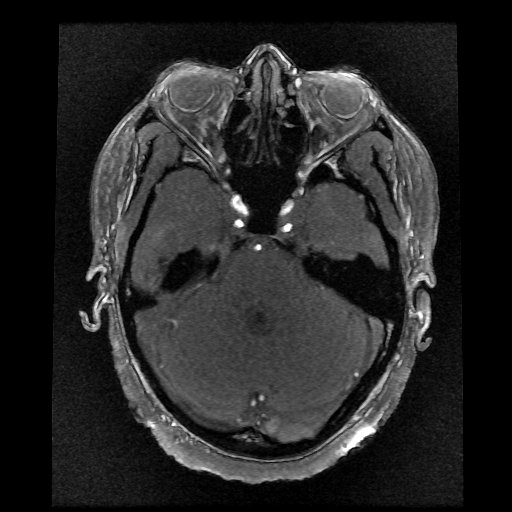
[im 69/136]
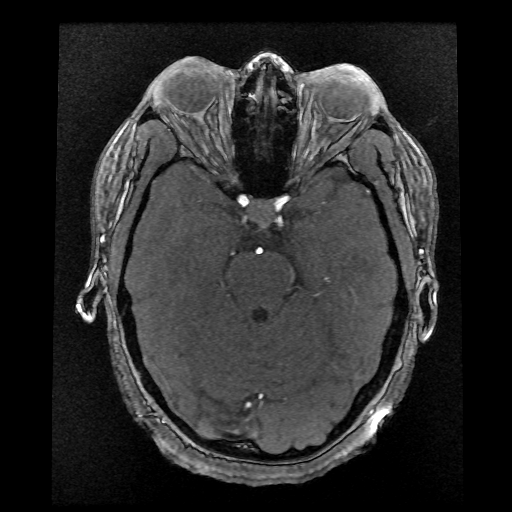
[im 78/136]
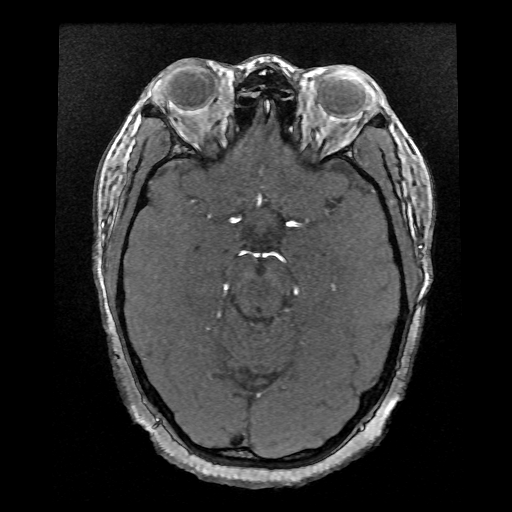
[im 95/136]
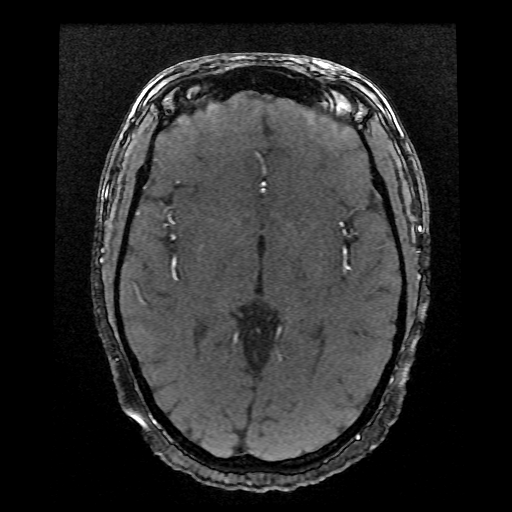
[im 113/136]
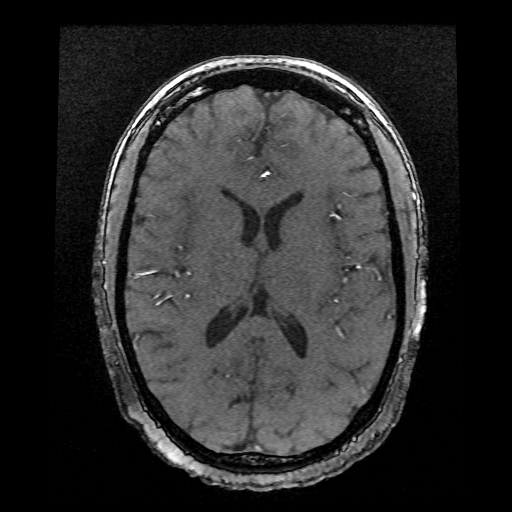
[im 115/136]
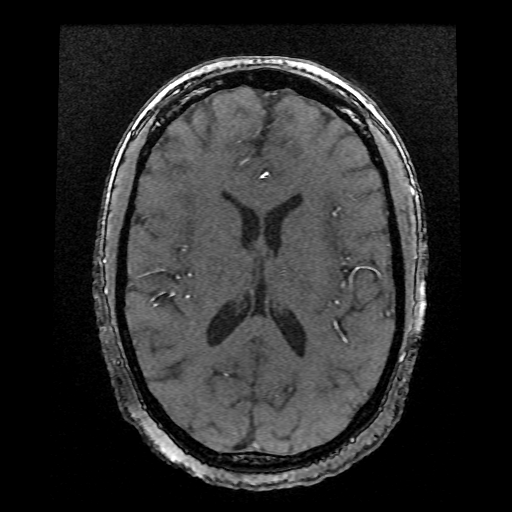
[im 130/136]
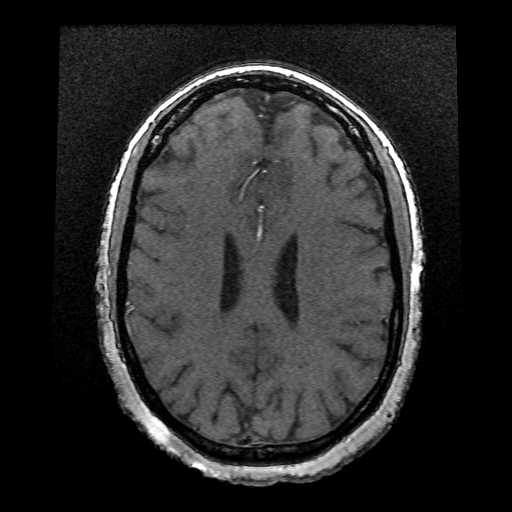

[20 of 48 positions shown; findings below may reference images not displayed]

FINDINGS: Internal carotid arteries:  Patent.

Anterior cerebral arteries:  Patent.

Middle cerebral arteries: Patent.

Anterior communicating artery: Patent.

Posterior communicating arteries: Not identified, likely hypoplastic
or absent.

Posterior cerebral arteries:  Patent.

Basilar artery:  Patent.

Vertebral arteries:  Patent.

No evidence of high-grade stenosis, large vessel occlusion, or
aneurysm unless noted above.
IMPRESSION: Normal MRA of the head.  No significant interval change.

By: Braide Dagash M.D.

## 2017-02-24 ENCOUNTER — Ambulatory Visit (INDEPENDENT_AMBULATORY_CARE_PROVIDER_SITE_OTHER): Payer: Medicaid Other | Admitting: Neurology

## 2017-02-24 ENCOUNTER — Encounter: Payer: Self-pay | Admitting: Neurology

## 2017-02-24 VITALS — BP 105/74 | HR 93 | Wt 174.8 lb

## 2017-02-24 DIAGNOSIS — I639 Cerebral infarction, unspecified: Secondary | ICD-10-CM | POA: Diagnosis not present

## 2017-02-24 NOTE — Patient Instructions (Signed)
I had a long d/w patient about her remote cryptogenic stroke, risk for recurrent stroke/TIAs, personally independently reviewed imaging studies and stroke evaluation results and answered questions.Continue aspirin 81 mg daily  for secondary stroke prevention and maintain strict control of hypertension with blood pressure goal below 130/90, diabetes with hemoglobin A1c goal below 6.5% and lipids with LDL cholesterol goal below 70 mg/dL. I also advised the patient to eat a healthy diet with plenty of whole grains, cereals, fruits and vegetables, exercise regularly and maintain ideal body weight. Since it has been more than 2 years since her stroke I do not recommend a routine scheduled follow-up appointment with me. She may follow-up with her primary care physician and be referred back in the future as necessary.
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
## 2017-03-07 ENCOUNTER — Ambulatory Visit (INDEPENDENT_AMBULATORY_CARE_PROVIDER_SITE_OTHER): Payer: Medicaid Other | Admitting: *Deleted

## 2017-03-07 DIAGNOSIS — I639 Cerebral infarction, unspecified: Secondary | ICD-10-CM

## 2017-03-08 NOTE — Progress Notes (Signed)
Carelink Summary Report / Loop Recorder 

## 2017-03-10 LAB — CUP PACEART REMOTE DEVICE CHECK
Date Time Interrogation Session: 20181008034058
Implantable Pulse Generator Implant Date: 20161017

## 2017-04-05 ENCOUNTER — Ambulatory Visit (INDEPENDENT_AMBULATORY_CARE_PROVIDER_SITE_OTHER): Payer: Medicaid Other | Admitting: *Deleted

## 2017-04-05 DIAGNOSIS — I639 Cerebral infarction, unspecified: Secondary | ICD-10-CM

## 2017-04-06 NOTE — Progress Notes (Signed)
Carelink Summary Report / Loop Recorder 

## 2017-04-07 LAB — CUP PACEART REMOTE DEVICE CHECK
Date Time Interrogation Session: 20181107033757
Implantable Pulse Generator Implant Date: 20161017

## 2017-04-28 ENCOUNTER — Other Ambulatory Visit: Payer: Self-pay

## 2017-04-28 ENCOUNTER — Encounter: Payer: Self-pay | Admitting: Internal Medicine

## 2017-04-28 ENCOUNTER — Ambulatory Visit: Payer: Medicaid Other | Admitting: Internal Medicine

## 2017-04-28 DIAGNOSIS — S39011A Strain of muscle, fascia and tendon of abdomen, initial encounter: Secondary | ICD-10-CM | POA: Diagnosis present

## 2017-04-28 DIAGNOSIS — X58XXXA Exposure to other specified factors, initial encounter: Secondary | ICD-10-CM

## 2017-04-28 DIAGNOSIS — M7918 Myalgia, other site: Secondary | ICD-10-CM

## 2017-04-28 NOTE — Patient Instructions (Addendum)
Ms. Coppa,  I think you have pulled a muscle in your side. You can use ice/heat, over the counter things like icy-hot, tylenol or ibuprofen are fine to take. If you start having any new symptoms please let us know and come in for a re-check.  FOLLOW-UP INSTRUCTIONS When: After New Years with Dr. Obie Dredge  For: Routine follow up What to bring: Your medications

## 2017-04-28 NOTE — Progress Notes (Signed)
   CC: left flank  HPI:  Amber [name] is a 46 y.o. female with a past medical history listed below here today with complaints of left flank pain.  She reports that 2 days ago she developed a pain in her left side and underneath her left breast. She reports the pain is worst with movement, especially twisting motions and bending forward. Does not recall any injury or trauma. No lifting heavy weights or recent exercise. Pain does not radiate anywhere. It is painful with deep palpation. No back pain, dysuria, hematuria, rashes, shortness of breath, chest pain.   Past Medical History:  Diagnosis Date  . Asthma   . Depression    controlled  . Hyperlipidemia    controlled with medication  . Hypertension   . IBS (irritable bowel syndrome)   . Stroke [facility] 03-13-2015   Review of Systems:   Negative except as noted in HPI  Physical Exam:  Vitals:   04/28/17 1502  BP: 116/70  Pulse: 97  Temp: 97.9 F (36.6 C)  TempSrc: Oral  SpO2: 98%  Weight: 178 lb 3.2 oz (80.8 kg)  Height: 5\' 2"  (1.575 m)   Physical Exam  Constitutional: She is well-developed, well-nourished, and in no distress. No distress.  HENT:  Head: Normocephalic and atraumatic.  Cardiovascular: Normal rate and regular rhythm.  Pulmonary/Chest: Effort normal and breath sounds normal.  Abdominal: Soft. Bowel sounds are normal. She exhibits no distension. There is no tenderness. There is no rebound.  Musculoskeletal: Normal range of motion.       Arms: Skin: Skin is warm and dry. No rash noted.  Psychiatric: Mood and affect normal.  Vitals reviewed.    Assessment & Plan:   See Encounters Tab for problem based charting.  Patient discussed with Dr. Criselda Peaches

## 2017-05-02 DIAGNOSIS — M7918 Myalgia, other site: Secondary | ICD-10-CM | POA: Insufficient documentation

## 2017-05-02 NOTE — Progress Notes (Signed)
Internal Medicine Clinic Attending  Case discussed with Dr. Boswell at the time of the visit.  We reviewed the resident's history and exam and pertinent patient test results.  I agree with the assessment, diagnosis, and plan of care documented in the resident's note.  

## 2017-05-02 NOTE — Assessment & Plan Note (Signed)
Patient with strained abdominal wall muscle in LUQ. Will treat conservatively with RICE therapy.

## 2017-05-04 ENCOUNTER — Telehealth: Payer: Self-pay

## 2017-05-04 NOTE — Telephone Encounter (Signed)
Requesting to speak with a nurse about muscle pain. Please call pt back.

## 2017-05-04 NOTE — Telephone Encounter (Signed)
Pt calls and states she has no improvement in her pain in the L side in fact it is getting worse more defined and constant. She would prefer not to have to come to clinic but reluctantly takes appt 12/6 [facility]

## 2017-05-05 ENCOUNTER — Ambulatory Visit (INDEPENDENT_AMBULATORY_CARE_PROVIDER_SITE_OTHER): Payer: Medicaid Other | Admitting: *Deleted

## 2017-05-05 ENCOUNTER — Ambulatory Visit: Payer: Medicaid Other

## 2017-05-05 DIAGNOSIS — I639 Cerebral infarction, unspecified: Secondary | ICD-10-CM

## 2017-05-06 NOTE — Progress Notes (Signed)
Carelink Summary Report / Loop Recorder 

## 2017-05-14 LAB — CUP PACEART REMOTE DEVICE CHECK
Date Time Interrogation Session: 20181207033708
MDC IDC PG IMPLANT DT: 20161017

## 2017-05-17 ENCOUNTER — Other Ambulatory Visit: Payer: Self-pay

## 2017-05-17 ENCOUNTER — Ambulatory Visit: Payer: Medicaid Other | Admitting: Internal Medicine

## 2017-05-17 ENCOUNTER — Encounter: Payer: Self-pay | Admitting: Internal Medicine

## 2017-05-17 DIAGNOSIS — E785 Hyperlipidemia, unspecified: Secondary | ICD-10-CM | POA: Diagnosis not present

## 2017-05-17 DIAGNOSIS — G2581 Restless legs syndrome: Secondary | ICD-10-CM

## 2017-05-17 DIAGNOSIS — I10 Essential (primary) hypertension: Secondary | ICD-10-CM

## 2017-05-17 DIAGNOSIS — Z79899 Other long term (current) drug therapy: Secondary | ICD-10-CM

## 2017-05-17 DIAGNOSIS — Z8673 Personal history of transient ischemic attack (TIA), and cerebral infarction without residual deficits: Secondary | ICD-10-CM | POA: Diagnosis not present

## 2017-05-17 DIAGNOSIS — R7303 Prediabetes: Secondary | ICD-10-CM | POA: Diagnosis not present

## 2017-05-17 DIAGNOSIS — F411 Generalized anxiety disorder: Secondary | ICD-10-CM | POA: Diagnosis not present

## 2017-05-17 DIAGNOSIS — K219 Gastro-esophageal reflux disease without esophagitis: Secondary | ICD-10-CM

## 2017-05-17 NOTE — Progress Notes (Signed)
   CC: follow up for anxiety    HPI:  [name] is a 46 y.o. with PMH RLS, HTN, GERD, hx of cryptogenic ischemic stroke, GAD, HLD, PreDM who presents for follow up of hyperlipidemia, hypertension, and generalized anxiety. Please see the assessment and plans for the status of the patient chronic medical problems.   Past Medical History:  Diagnosis Date  . Asthma   . Depression    controlled  . Hyperlipidemia    controlled with medication  . Hypertension   . IBS (irritable bowel syndrome)   . Stroke [facility]) 03-13-2015   Review of Systems:  Refer to history of present illness and assessment and plans for pertinent review of systems, all others reviewed and negative  Physical Exam:  Vitals:   05/17/17 1411  BP: 112/77  Pulse: 76  Temp: 97.8 F (36.6 C)  TempSrc: Oral  SpO2: 100%  Weight: 177 lb 6.4 oz (80.5 kg)  Height: 5\' 2"  (1.575 m)   General: well appearing, no acute distress  Cardiac: RRR, no murmur appreciated  Pulm: lungs clear to auscultation  Neuro: AAOx3, normal gait, no facial droop, strength in upper and lower extremities equal and intact 5/5  Assessment & Plan:   Preventative health  Declined Flu and tetnus vaccine today  Declined Pap smear - says she will do this through her OBGYN in [place] and have the results sent   HLD  LDL 76 in January. Compliant with statin and not experiencing side effects.  - continue Rosuvastatin 20 mg qd   HTN  BP Readings from Last 3 Encounters:  05/17/17 112/77  04/28/17 116/70  02/24/17 105/74  Blood pressure is well controlled today. She is below goal of <130/80. She is concerned about the side effects of HCTZ and would like to be on fewer medications. She has been taking the HCTZ every other day but would like to stop taking it completely. I agree that she may not need to be on the HCTZ and feel that she is high functioning and would be able to maintain a more aggressive exercise routine. She does have history of  cryptogenic ischemic stroke so we discussed the concern that she will need to follow the blood pressure closely.  - discontinue HCTZ 12.5 mg qd  - recommended low salt diet and exercise  GAD  GAD-7 score is 8 today. She describes difficulties with relaxing every day but rates other symptoms as being under better control. We had a conversation about the life events that are giving her anxiety. She feels that she would like to try counseling.  - advised [facility] which would be the most affordable option  - continue Trazodone 150 mg qHS   See Encounters Tab for problem based charting.  Patient discussed with Dr. Oswaldo Done

## 2017-05-17 NOTE — Patient Instructions (Signed)
FOLLOW-UP INSTRUCTIONS When: 1- 3 months  For: blood pressure check  What to bring: medication bottles   It was a pleasure to see you today Amber [name]  - For your blood pressure, you can stop taking the hydrochlorothiazide completely but start exercising more and eating a low salt diet  - For your difficulty relaxing, [facility] and [facility] if you [facility] there  - Please call our clinic if you have any problems or questions, we may be able to help you and keep you from a long emergency room wait. Our clinic and after hours phone number is [phone]       Mediterranean Diet  Why follow it? Research shows. . Those who follow the Mediterranean diet have a reduced risk of heart disease  . The diet is associated with a reduced incidence of Parkinson's and Alzheimer's diseases . People following the diet may have longer life expectancies and lower rates of chronic diseases  . The Dietary Guidelines for Americans recommends the Mediterranean diet as an eating plan to promote health and prevent disease  What Is the Mediterranean Diet?  . Healthy eating plan based on typical foods and recipes of Mediterranean-style cooking . The diet is primarily a plant based diet; these foods should make up a majority of meals   Starches - Plant based foods should make up a majority of meals - They are an important sources of vitamins, minerals, energy, antioxidants, and fiber - Choose whole grains, foods high in fiber and minimally processed items  - Typical grain sources include wheat, oats, barley, corn, brown rice, bulgar, farro, millet, polenta, couscous  - Various types of beans include chickpeas, lentils, fava beans, black beans, white beans   Fruits  Veggies - Large quantities of antioxidant rich fruits & veggies; 6 or more servings  - Vegetables can be eaten raw or lightly drizzled with oil and cooked  - Vegetables common to the traditional Mediterranean Diet include: artichokes,  arugula, beets, broccoli, brussel sprouts, cabbage, carrots, celery, collard greens, cucumbers, eggplant, kale, leeks, lemons, lettuce, mushrooms, okra, onions, peas, peppers, potatoes, pumpkin, radishes, rutabaga, shallots, spinach, sweet potatoes, turnips, zucchini - Fruits common to the Mediterranean Diet include: apples, apricots, avocados, cherries, clementines, dates, figs, grapefruits, grapes, melons, nectarines, oranges, peaches, pears, pomegranates, strawberries, tangerines  Fats - Replace butter and margarine with healthy oils, such as olive oil, canola oil, and tahini  - Limit nuts to no more than a handful a day  - Nuts include walnuts, almonds, pecans, pistachios, pine nuts  - Limit or avoid candied, honey roasted or heavily salted nuts - Olives are central to the [employer]iterranean diet - can be eaten whole or used in a variety of dishes   Meats Protein - Limiting red meat: no more than a few times a month - When eating red meat: choose lean cuts and keep the portion to the size of deck of cards - Eggs: approx. 0 to 4 times a week  - Fish and lean poultry: at least 2 a week  - Healthy protein sources include, chicken, [place], lean beef, lamb - Increase intake of seafood such as tuna, salmon, trout, mackerel, shrimp, scallops - Avoid or limit high fat processed meats such as sausage and bacon  Dairy - Include moderate amounts of low fat dairy products  - Focus on healthy dairy such as fat free yogurt, skim milk, low or reduced fat cheese - Limit dairy products higher in fat such as whole  or 2% milk, cheese, ice cream  Alcohol - Moderate amounts of red wine is ok  - No more than 5 oz daily for women (all ages) and men older than age 50  - No more than 10 oz of wine daily for men younger than 6  Other - Limit sweets and other desserts  - Use herbs and spices instead of salt to flavor foods  - Herbs and spices common to the traditional Mediterranean Diet include: basil, bay leaves,  chives, cloves, cumin, fennel, garlic, lavender, marjoram, mint, oregano, parsley, pepper, rosemary, sage, savory, sumac, tarragon, thyme   It's not just a diet, it's a lifestyle:  . The Mediterranean diet includes lifestyle factors typical of those in the region  . Foods, drinks and meals are best eaten with others and savored . Daily physical activity is important for overall good health . This could be strenuous exercise like running and aerobics . This could also be more leisurely activities such as walking, housework, yard-work, or taking the stairs . Moderation is the key; a balanced and healthy diet accommodates most foods and drinks . Consider portion sizes and frequency of consumption of certain foods   Meal Ideas & Options:  . Breakfast:  o Whole wheat toast or whole wheat English muffins with peanut butter & hard boiled egg o Steel cut oats topped with apples & cinnamon and skim milk  o Fresh fruit: banana, strawberries, melon, berries, peaches  o Smoothies: strawberries, bananas, greek yogurt, peanut butter o Low fat greek yogurt with blueberries and granola  o Egg white omelet with spinach and mushrooms o Breakfast couscous: whole wheat couscous, apricots, skim milk, cranberries  . Sandwiches:  o Hummus and grilled vegetables (peppers, zucchini, squash) on whole wheat bread   o Grilled chicken on whole wheat pita with lettuce, tomatoes, cucumbers or tzatziki  o Tuna salad on whole wheat bread: tuna salad made with greek yogurt, olives, red peppers, capers, green onions o Garlic rosemary lamb pita: lamb sauted with garlic, rosemary, salt & pepper; add lettuce, cucumber, greek yogurt to pita - flavor with lemon juice and black pepper  . Seafood:  o Mediterranean grilled salmon, seasoned with garlic, basil, parsley, lemon juice and black pepper o Shrimp, lemon, and spinach whole-grain pasta salad made with low fat greek yogurt  o Seared scallops with lemon orzo  o Seared tuna  steaks seasoned salt, pepper, coriander topped with tomato mixture of olives, tomatoes, olive oil, minced garlic, parsley, green onions and cappers  . Meats:  o Herbed greek chicken salad with kalamata olives, cucumber, feta  o Red bell peppers stuffed with spinach, bulgur, lean ground beef (or lentils) & topped with feta   o Kebabs: skewers of chicken, tomatoes, onions, zucchini, squash  o [place] burgers: made with red onions, mint, dill, lemon juice, feta cheese topped with roasted red peppers . Vegetarian o Cucumber salad: cucumbers, artichoke hearts, celery, red onion, feta cheese, tossed in olive oil & lemon juice  o Hummus and whole grain pita points with a greek salad (lettuce, tomato, feta, olives, cucumbers, red onion) o Lentil soup with celery, carrots made with vegetable broth, garlic, salt and pepper  o Tabouli salad: parsley, bulgur, mint, scallions, cucumbers, tomato, radishes, lemon juice, olive oil, salt and pepper.

## 2017-05-19 ENCOUNTER — Encounter: Payer: Self-pay | Admitting: Internal Medicine

## 2017-05-19 NOTE — Assessment & Plan Note (Signed)
LDL 76 in January. Compliant with statin and not experiencing side effects.  - continue Rosuvastatin 20 mg qd

## 2017-05-19 NOTE — Assessment & Plan Note (Signed)
BP Readings from Last 3 Encounters:  05/17/17 112/77  04/28/17 116/70  02/24/17 105/74  Blood pressure is well controlled today. She is below goal of <130/80. She is concerned about the side effects of HCTZ and would like to be on fewer medications. She has been taking the HCTZ every other day but would like to stop taking it completely. I agree that she may not need to be on the HCTZ and feel that she is high functioning and would be able to maintain a more aggressive exercise routine. She does have history of cryptogenic ischemic stroke so we discussed the concern that she will need to follow the blood pressure closely.  - discontinue HCTZ 12.5 mg qd  - recommended low salt diet and exercise

## 2017-05-19 NOTE — Assessment & Plan Note (Signed)
GAD-7 score is 8 today. She describes difficulties with relaxing every day but rates other symptoms as being under better control. We had a conversation about the life events that are giving her anxiety. She feels that she would like to try counseling.  - advised [facility] which would be the most affordable option  - continue Trazodone 150 mg qHS

## 2017-05-20 NOTE — Progress Notes (Signed)
Internal Medicine Clinic Attending  Case discussed with Dr. Blum at the time of the visit.  We reviewed the resident's history and exam and pertinent patient test results.  I agree with the assessment, diagnosis, and plan of care documented in the resident's note. 

## 2017-05-31 ENCOUNTER — Emergency Department (HOSPITAL_COMMUNITY)
Admission: EM | Admit: 2017-05-31 | Discharge: 2017-06-01 | Disposition: A | Discharge status: Home / Self Care | Payer: Medicaid Other | Attending: Emergency Medicine | Admitting: Emergency Medicine

## 2017-05-31 ENCOUNTER — Other Ambulatory Visit: Payer: Self-pay

## 2017-05-31 ENCOUNTER — Encounter (HOSPITAL_COMMUNITY): Payer: Self-pay | Admitting: *Deleted

## 2017-05-31 DIAGNOSIS — J45909 Unspecified asthma, uncomplicated: Secondary | ICD-10-CM | POA: Diagnosis not present

## 2017-05-31 DIAGNOSIS — J111 Influenza due to unidentified influenza virus with other respiratory manifestations: Secondary | ICD-10-CM

## 2017-05-31 DIAGNOSIS — Z8673 Personal history of transient ischemic attack (TIA), and cerebral infarction without residual deficits: Secondary | ICD-10-CM | POA: Diagnosis not present

## 2017-05-31 DIAGNOSIS — M791 Myalgia, unspecified site: Secondary | ICD-10-CM | POA: Diagnosis present

## 2017-05-31 DIAGNOSIS — Z79899 Other long term (current) drug therapy: Secondary | ICD-10-CM | POA: Insufficient documentation

## 2017-05-31 DIAGNOSIS — R070 Pain in throat: Secondary | ICD-10-CM | POA: Diagnosis not present

## 2017-05-31 DIAGNOSIS — R509 Fever, unspecified: Secondary | ICD-10-CM | POA: Diagnosis not present

## 2017-05-31 DIAGNOSIS — R05 Cough: Secondary | ICD-10-CM | POA: Diagnosis not present

## 2017-05-31 DIAGNOSIS — R69 Illness, unspecified: Secondary | ICD-10-CM | POA: Diagnosis not present

## 2017-05-31 DIAGNOSIS — I10 Essential (primary) hypertension: Secondary | ICD-10-CM | POA: Insufficient documentation

## 2017-05-31 NOTE — ED Triage Notes (Signed)
The pt has had a cold  Cough dry fever  Upper respiratory since Sunday  She reports that it is getting no better  lmp none

## 2017-06-01 ENCOUNTER — Ambulatory Visit (INDEPENDENT_AMBULATORY_CARE_PROVIDER_SITE_OTHER): Payer: Medicaid Other | Admitting: Internal Medicine

## 2017-06-01 VITALS — BP 112/75 | HR 95 | Temp 98.0°F | Wt 177.9 lb

## 2017-06-01 DIAGNOSIS — J111 Influenza due to unidentified influenza virus with other respiratory manifestations: Secondary | ICD-10-CM | POA: Diagnosis not present

## 2017-06-01 DIAGNOSIS — R05 Cough: Secondary | ICD-10-CM

## 2017-06-01 DIAGNOSIS — R5381 Other malaise: Secondary | ICD-10-CM

## 2017-06-01 DIAGNOSIS — R233 Spontaneous ecchymoses: Secondary | ICD-10-CM | POA: Diagnosis present

## 2017-06-01 DIAGNOSIS — R5383 Other fatigue: Secondary | ICD-10-CM

## 2017-06-01 MED ORDER — HYDROCORTISONE 1 % EX CREA: TOPICAL_CREAM | CUTANEOUS | 0 refills | Status: DC

## 2017-06-01 MED ORDER — LORATADINE 10 MG PO TABS: 10.0000 mg | ORAL_TABLET | Freq: Every day | ORAL | 0 refills | Status: DC

## 2017-06-01 MED ORDER — BENZONATATE 100 MG PO CAPS: 200.0000 mg | ORAL_CAPSULE | Freq: Two times a day (BID) | ORAL | 0 refills | Status: DC | PRN

## 2017-06-01 MED ORDER — OSELTAMIVIR PHOSPHATE 75 MG PO CAPS: 75.0000 mg | ORAL_CAPSULE | Freq: Two times a day (BID) | ORAL | 0 refills | Status: DC

## 2017-06-01 MED ORDER — FLUTICASONE PROPIONATE 50 MCG/ACT NA SUSP: 1.0000 | Freq: Every day | NASAL | 0 refills | Status: DC

## 2017-06-01 NOTE — ED Provider Notes (Signed)
[facility] EMERGENCY DEPARTMENT Provider Note   CSN: [id number] Arrival date & time: 05/31/17  2200     History   Chief Complaint Chief Complaint  Patient presents with  . Cough    HPI Amber [name] is a 47 y.o. female.  Patient presents to the emergency department with a chief complaint of generalized body aches.  She reports associated fevers, chills, cough, and sore throat.  Reports onset of symptoms 2 days ago.  She has tried OTC medications with some relief.  There are no other associated symptoms.   The history is provided by the patient. No language interpreter was used.    Past Medical History:  Diagnosis Date  . Asthma   . Depression    controlled  . Hyperlipidemia    controlled with medication  . Hypertension   . IBS (irritable bowel syndrome)   . Stroke [facility]) 03-13-2015    Patient Active Problem List   Diagnosis Date Noted  . Musculoskeletal pain 05/02/2017  . Restless leg syndrome 12/29/2016  . GERD (gastroesophageal reflux disease) 08/24/2016  . Hemorrhoids 08/24/2016  . Healthcare maintenance 08/24/2016  . GAD (generalized anxiety disorder) 06/22/2016  . Levoscoliosis 10/06/2015  . Prediabetes 09/08/2015  . Other fatigue 04/03/2015  . Acute ischemic stroke (HCC) 03/14/2015  . Insomnia 09/29/2014  . Hyperlipidemia 09/29/2014  . Essential hypertension 09/29/2014    Past Surgical History:  Procedure Laterality Date  . ABDOMINAL HYSTERECTOMY  10/2006  . bowel reconstruction  10/2006   with hysterectomy  . CESAREAN SECTION  2005  . EP IMPLANTABLE DEVICE N/A 03/17/2015   Procedure: Loop Recorder Insertion;  Surgeon: Will Jorja Loa, MD;  Location: [facility] INVASIVE CV LAB;  Service: Cardiovascular;  Laterality: N/A;  . TEE WITHOUT CARDIOVERSION N/A 03/17/2015   Procedure: TRANSESOPHAGEAL ECHOCARDIOGRAM (TEE);  Surgeon: Chilton Si, MD;  Location: [facility] ENDOSCOPY;  Service: Cardiovascular;  Laterality: N/A;  . TOE SURGERY     Left 2nd metatarsal    OB History    Gravida Para Term Preterm AB Living   1 1 0 1 0 1   SAB TAB Ectopic Multiple Live Births   0 0 0 0         Home Medications    Prior to Admission medications   Medication Sig Start Date End Date Taking? Authorizing Provider  aspirin EC 81 MG tablet Take 1 tablet (81 mg total) by mouth daily. 11/29/16 11/29/17  Eulah Pont, MD  benzonatate (TESSALON) 100 MG capsule Take 2 capsules (200 mg total) by mouth 2 (two) times daily as needed for cough. 06/01/17   Roxy Horseman, PA-C  docusate sodium (COLACE) 100 MG capsule Take 100 mg by mouth daily.     [provider]  fluticasone (FLONASE) 50 MCG/ACT nasal spray Place 1 spray into both nostrils daily. 06/01/17   Roxy Horseman, PA-C  hydrochlorothiazide (HYDRODIURIL) 12.5 MG tablet Take 1 tablet (12.5 mg total) by mouth daily. 01/07/17   Eulah Pont, MD  linaclotide [facility]) 72 MCG capsule TAKE ONE CAPSULE BY MOUTH ONCE DAILY BEFORE BREAKFAST Patient taking differently: Take 72 mcg by mouth daily before breakfast.  05/13/16   Pyrtle, Carie Caddy, MD  methocarbamol (ROBAXIN) 500 MG tablet Take 1 tablet (500 mg total) by mouth 2 (two) times daily. 02/13/17   Joy, Shawn C, PA-C  Omega-3 Fatty Acids (FISH OIL PO) Take 1 capsule by mouth daily.    [provider]  oseltamivir (TAMIFLU) 75 MG capsule Take 1 capsule (75 mg  total) by mouth every 12 (twelve) hours. 06/01/17   Roxy Horseman, PA-C  pantoprazole (PROTONIX) 40 MG tablet Take 1 tablet (40 mg total) by mouth daily. 08/24/16   Eulah Pont, MD  pramipexole (MIRAPEX) 0.125 MG tablet Take 1 tablet (0.125 mg total) by mouth at bedtime. 12/29/16 12/29/17  Levora Dredge, MD  rosuvastatin (CRESTOR) 20 MG tablet Take 1 tablet (20 mg total) by mouth daily. 10/26/16   Eulah Pont, MD  traZODone (DESYREL) 150 MG tablet Take 1 tablet (150 mg total) by mouth at bedtime as needed. for sleep 08/24/16   Eulah Pont, MD    Family History Family History  Problem  Relation Age of Onset  . Arthritis Father   . Diabetes Father   . Hypertension Father   . Hyperlipidemia Father   . Hyperlipidemia Mother   . Stroke Paternal Grandmother   . Heart attack Maternal Grandfather   . Hyperlipidemia Maternal Grandfather   . Heart attack Paternal Grandfather     Social History Social History   Tobacco Use  . Smoking status: Never Smoker  . Smokeless tobacco: Never Used  Substance Use Topics  . Alcohol use: Yes    Alcohol/week: 1.2 oz    Types: 2 Glasses of wine per week    Comment: 2 glasses a week   . Drug use: No     Allergies   Keflex [cephalexin]   Review of Systems Review of Systems  All other systems reviewed and are negative.    Physical Exam Updated Vital Signs BP (!) 141/100 (BP Location: Right Arm)   Pulse (!) 104   Temp 99.3 F (37.4 C) (Oral)   Resp 16   Ht 5\' 2"  (1.575 m)   Wt 79.4 kg (175 lb)   SpO2 100%   BMI 32.01 kg/m   Physical Exam Nursing note and vitals reviewed.  Constitutional: Appears well-developed and well-nourished. No distress.  HENT: Oropharynx is mildly erythematous, no tonsillar exudates or abscess, airway intact. Eyes: Conjunctivae are normal. Pupils are equal, round, and reactive to light.  Neck: Normal range of motion and full passive range of motion without pain.  Cardiovascular: HR 104 and intact distal pulses.   Pulmonary/Chest: Effort normal and breath sounds normal. No stridor. No wheezes, rhonchi, or rales. Abdominal: Soft. There is no focal abdominal tenderness.  Musculoskeletal: Normal range of motion. Moves all extremities. Neurological: Pt is alert and oriented to person, place, and time. Sensation and strength grossly intact throughout. Skin: Skin is warm and dry. No rash noted. Pt is not diaphoretic.  Psychiatric: Normal mood and affect.    ED Treatments / Results  Labs (all labs ordered are listed, but only abnormal results are displayed) Labs Reviewed - No data to  display  EKG  EKG Interpretation None       Radiology No results found.  Procedures Procedures (including critical care time)  Medications Ordered in ED Medications - No data to display   Initial Impression / Assessment and Plan / ED Course  I have reviewed the triage vital signs and the nursing notes.  Pertinent labs & imaging results that were available during my care of the patient were reviewed by me and considered in my medical decision making (see chart for details).    Patient with flulike symptoms including fever, chills, cough, and body aches.  She is at the upper limit of Tamiflu treatment window, but requests to be treated.  Will also prescribe Tessalon Perles and Flonase.  Recommend PCP follow-up.  Final Clinical Impressions(s) / ED Diagnoses   Final diagnoses:  Influenza-like illness    ED Discharge Orders        Ordered    oseltamivir (TAMIFLU) 75 MG capsule  Every 12 hours     06/01/17 0049    benzonatate (TESSALON) 100 MG capsule  2 times daily PRN     06/01/17 0049    fluticasone (FLONASE) 50 MCG/ACT nasal spray  Daily     06/01/17 0049       Roxy Horseman, PA-C 06/01/17 0051    Ward, Layla Maw, DO 06/01/17 0105

## 2017-06-01 NOTE — Progress Notes (Signed)
   CC: Red round areas on arms bilaterally   HPI:  Amber [name] is a 47 y.o. female with history noted below that presents the acute care clinic for red spots that appeared on her arms bilaterally and neck region 3 days ago. She states she was visiting her sister in [place] when she first noticed the spots. She denies any pain or pruritus. She was recently seen in the ED yesterday and was diagnosed with the flu per patient's  symptoms. She states she has not started her Tamiflu or Tessalon Perles and will get those today at the pharmacy.  Past Medical History:  Diagnosis Date  . Asthma   . Depression    controlled  . Hyperlipidemia    controlled with medication  . Hypertension   . IBS (irritable bowel syndrome)   . Stroke [facility]) 03-13-2015    Review of Systems:  Review of Systems  Constitutional: Positive for malaise/fatigue. Negative for chills and fever.  Respiratory: Positive for cough.   Cardiovascular: Negative for chest pain.  Gastrointestinal: Negative for nausea and vomiting.     Physical Exam:  Vitals:   06/01/17 1608  BP: 112/75  Pulse: 95  Temp: 98 F (36.7 C)  TempSrc: Oral  SpO2: 97%  Weight: 177 lb 14.4 oz (80.7 kg)   Physical Exam  Pulmonary/Chest: Effort normal. No respiratory distress.  Skin: Skin is warm and dry.  Several Red circular areas with wheel measuring approximately 1cm in diameter or less scattered throughout her arms bilaterally and a couple in her neck region.     Assessment & Plan:   See encounters tab for problem based medical decision making.    Patient seen with Dr. Heide Spark

## 2017-06-01 NOTE — Assessment & Plan Note (Signed)
Assessment: likely Bug bites She has diffuse red spots on her arms bilaterally and neck region that appeared to be bug bites. She states that she was in a [place] when she developed these marks. She denies pain or spots on other parts of her body. Will try Claritin and hydrocortisone cream. Told patient to call clinic if symptoms do not improve or worsen.   Plan - Claritin -Hydrocortisone cream

## 2017-06-01 NOTE — Patient Instructions (Signed)
Amber [name],  It was a pleasure meeting you today.  Please start taking loratadine daily and use hydrocortisone cream to help with your symptoms.

## 2017-06-03 NOTE — Progress Notes (Signed)
Internal Medicine Clinic Attending  I saw and evaluated the patient.  I personally confirmed the key portions of the history and exam documented by Dr. Hoffman and I reviewed pertinent patient test results.  The assessment, diagnosis, and plan were formulated together and I agree with the documentation in the resident's note.      

## 2017-06-03 NOTE — Addendum Note (Signed)
Addended by: Earl Lagos on: 06/03/2017 10:24 AM   Modules accepted: Level of Service

## 2017-06-06 ENCOUNTER — Ambulatory Visit (INDEPENDENT_AMBULATORY_CARE_PROVIDER_SITE_OTHER): Payer: Medicaid Other | Admitting: *Deleted

## 2017-06-06 DIAGNOSIS — I639 Cerebral infarction, unspecified: Secondary | ICD-10-CM

## 2017-06-06 NOTE — Progress Notes (Signed)
Carelink Summary Report / Loop Recorder 

## 2017-06-17 LAB — CUP PACEART REMOTE DEVICE CHECK
Implantable Pulse Generator Implant Date: 20161017
MDC IDC SESS DTM: 20190106034032

## 2017-06-20 DIAGNOSIS — Z1231 Encounter for screening mammogram for malignant neoplasm of breast: Secondary | ICD-10-CM | POA: Diagnosis not present

## 2017-07-04 ENCOUNTER — Ambulatory Visit (INDEPENDENT_AMBULATORY_CARE_PROVIDER_SITE_OTHER): Payer: Medicaid Other | Admitting: *Deleted

## 2017-07-04 DIAGNOSIS — I639 Cerebral infarction, unspecified: Secondary | ICD-10-CM

## 2017-07-05 NOTE — Progress Notes (Signed)
Carelink Summary Report / Loop Recorder 

## 2017-07-18 ENCOUNTER — Encounter: Payer: Medicaid Other | Admitting: Internal Medicine

## 2017-07-18 NOTE — Progress Notes (Deleted)
   CC: ***  HPI:  [name] is a 47 y.o. with PMH RLS, HTN, GERD, hx of cryptogenic ischemic stroke, GAD, HLD, PreDM who presents for follow up of ***. Please see the assessment and plans for the status of the patient chronic medical problems.   Past Medical History:  Diagnosis Date  . Asthma   . Depression    controlled  . Hyperlipidemia    controlled with medication  . Hypertension   . IBS (irritable bowel syndrome)   . Stroke [facility]) 03-13-2015   Review of Systems:  Refer to history of present illness and assessment and plans for pertinent review of systems, all others reviewed and negative   Physical Exam:  There were no vitals filed for this visit. ***  Assessment & Plan:   See Encounters Tab for problem based charting.  Patient {GC/GE:[id number]::"discussed with","seen with"} Dr. {NAMES:[id number]::"Butcher","Granfortuna","E. Hoffman","Klima","Mullen","Narendra","Raines","Vincent"}

## 2017-07-19 ENCOUNTER — Encounter: Payer: Medicaid Other | Admitting: Internal Medicine

## 2017-07-26 LAB — CUP PACEART REMOTE DEVICE CHECK
Implantable Pulse Generator Implant Date: 20161017
MDC IDC SESS DTM: 20190205074406

## 2017-08-01 ENCOUNTER — Encounter: Payer: Self-pay | Admitting: Internal Medicine

## 2017-08-01 ENCOUNTER — Ambulatory Visit: Payer: Medicaid Other | Admitting: Internal Medicine

## 2017-08-01 VITALS — BP 110/80 | HR 72 | Ht 62.0 in | Wt 178.2 lb

## 2017-08-01 DIAGNOSIS — R1013 Epigastric pain: Secondary | ICD-10-CM

## 2017-08-01 DIAGNOSIS — K219 Gastro-esophageal reflux disease without esophagitis: Secondary | ICD-10-CM | POA: Diagnosis not present

## 2017-08-01 DIAGNOSIS — K581 Irritable bowel syndrome with constipation: Secondary | ICD-10-CM

## 2017-08-01 MED ORDER — RANITIDINE HCL 150 MG PO TABS: ORAL_TABLET | ORAL | 3 refills | Status: DC

## 2017-08-01 MED ORDER — LINACLOTIDE 72 MCG PO CAPS: 72.0000 ug | ORAL_CAPSULE | Freq: Every day | ORAL | 10 refills | Status: DC

## 2017-08-01 NOTE — Progress Notes (Signed)
   Subjective:    Patient ID: [name], female    DOB: 05/27/1971, 47 y.o.   MRN: [id number]  HPI Vaanya Alvord is a 47 year with hx of chronic constipation, GERD, hx of CVA, HTN, HL here for followup.  She is here alone today.  She reports that she has been doing well but she has been under a considerable amount of stress at work.  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  She has noticed some intermittent issues with nausea and sour taste in her mouth.  She is been off of reflux medications.  She is not having much true heartburn and no dysphagia or odynophagia.  But more intermittently nausea and decreased appetite.  No vomiting.  She is also been off Linzess and having a bowel movement about 2 days/week.  This medicine was working well but because of her desire to be on less medication she stopped her reflux and constipation medications.  She is having some intermittent lower abdominal crampy pain and discomfort which is often relieved by bowel movement.  No blood in her stool or melena.  She was seen last year for internal hemorrhoids but these have not been a problem recently.  She is occasionally using stool softener but not laxative.  She is also been weaned off of her blood pressure medication and her blood pressures have been well controlled without medications.  She is using a low-dose aspirin now, rather than full dose, Crestor and trazodone as needed for sleep   Review of Systems As per HPI, otherwise negative  Current Medications, Allergies, Past Medical History, Past Surgical History, Family History and Social History were reviewed in [employer] record.     Objective:   Physical Exam BP 110/80   Pulse 72   Ht 5\' 2"  (1.575 m)   Wt 178 lb 3.2 oz (80.8 kg)   BMI 32.59 kg/m  Constitutional: Well-developed and well-nourished. No distress. HEENT: anicteric CV: RRR, no  mrg Pulm: CTA b/l Abd: soft, NT/ND, +BS throughout Ext: no c/c/e Neuro: nonfocal     Assessment & Plan:  47 year with hx of chronic constipation, GERD, hx of CVA, HTN, HL here for followup.   1.  Chronic constipation with IBS  --after our discussion I do think she would benefit from restarting Linzess 72 mcg on a daily basis.  This was working well to improve her bowel frequency and also had led to resolution of her intermittent lower abdominal discomfort.  After discussion she is agreeable and wanting to resume Linzess.  2.  GERD/dyspepsia/nausea --intermittent symptoms and I do not think she needs a daily medication for this at present.  I am going to have her use ranitidine 150 mg twice daily as needed.  If this becomes a more frequent problem for her she is asked to let me know.  She voices understanding  3.  CRC screening --she had a colonoscopy in 2011 which was normal.  No family history of colon cancer.  Repeat colonoscopy for screening recommended in 2021  Annual follow-up, sooner if needed 25 minutes spent with the patient today. Greater than 50% was spent in counseling and coordination of care with the patient

## 2017-08-01 NOTE — Patient Instructions (Signed)
We have sent the following medications to your pharmacy for you to pick up at your convenience: Zantac 150 mg twice daily before meals as needed Linzess 72 mcg daily (on a regular basis)  You will be due for a recall colonoscopy in 01/2020. We will send you a reminder in the mail when it gets closer to that time.  Please follow up with Dr Rhea Belton in 1 year.  If you are age 45 or older, your body mass index should be between 23-30. Your Body mass index is 32.59 kg/m. If this is out of the aforementioned range listed, please consider follow up with your Primary Care Provider.  If you are age 3 or younger, your body mass index should be between 19-25. Your Body mass index is 32.59 kg/m. If this is out of the aformentioned range listed, please consider follow up with your Primary Care Provider.

## 2017-08-08 ENCOUNTER — Ambulatory Visit (INDEPENDENT_AMBULATORY_CARE_PROVIDER_SITE_OTHER): Payer: Medicaid Other | Admitting: *Deleted

## 2017-08-08 DIAGNOSIS — I639 Cerebral infarction, unspecified: Secondary | ICD-10-CM | POA: Diagnosis not present

## 2017-08-08 NOTE — Progress Notes (Signed)
Carelink Summary Report / Loop Recorder 

## 2017-09-06 ENCOUNTER — Ambulatory Visit: Payer: Medicaid Other | Admitting: Internal Medicine

## 2017-09-06 ENCOUNTER — Encounter: Payer: Self-pay | Admitting: Internal Medicine

## 2017-09-06 VITALS — BP 125/88 | HR 81 | Temp 98.1°F | Ht 62.0 in | Wt 178.7 lb

## 2017-09-06 DIAGNOSIS — K219 Gastro-esophageal reflux disease without esophagitis: Secondary | ICD-10-CM

## 2017-09-06 DIAGNOSIS — I69318 Other symptoms and signs involving cognitive functions following cerebral infarction: Secondary | ICD-10-CM | POA: Diagnosis not present

## 2017-09-06 DIAGNOSIS — R7303 Prediabetes: Secondary | ICD-10-CM

## 2017-09-06 DIAGNOSIS — G2581 Restless legs syndrome: Secondary | ICD-10-CM

## 2017-09-06 DIAGNOSIS — I10 Essential (primary) hypertension: Secondary | ICD-10-CM

## 2017-09-06 DIAGNOSIS — F411 Generalized anxiety disorder: Secondary | ICD-10-CM | POA: Diagnosis not present

## 2017-09-06 DIAGNOSIS — Z79899 Other long term (current) drug therapy: Secondary | ICD-10-CM | POA: Diagnosis not present

## 2017-09-06 DIAGNOSIS — K581 Irritable bowel syndrome with constipation: Secondary | ICD-10-CM

## 2017-09-06 DIAGNOSIS — I639 Cerebral infarction, unspecified: Secondary | ICD-10-CM

## 2017-09-06 DIAGNOSIS — R5383 Other fatigue: Secondary | ICD-10-CM | POA: Diagnosis not present

## 2017-09-06 DIAGNOSIS — Z7982 Long term (current) use of aspirin: Secondary | ICD-10-CM | POA: Diagnosis not present

## 2017-09-06 DIAGNOSIS — E785 Hyperlipidemia, unspecified: Secondary | ICD-10-CM

## 2017-09-06 DIAGNOSIS — Z8349 Family history of other endocrine, nutritional and metabolic diseases: Secondary | ICD-10-CM | POA: Diagnosis not present

## 2017-09-06 NOTE — Patient Instructions (Addendum)
Thank you for coming to the clinic today. It was a pleasure to see you.   For your fatigue - I will let you know the results of your labwork  Please call our clinic back if you havent heard about scheduling the sleep study in the next two weeks   Please start taking wellbutrin 100 mg twice daily and try meeting with a counselor at [facility] in downtown [place]   FOLLOW-UP INSTRUCTIONS When: June 11 with Dr. Obie Dredge   For: general check up, follow up of the anxiety  What to bring: all of your medication bottles   Please call our clinic if you have any questions or concerns, we may be able to help and keep you from a long and expensive emergency room wait. Our clinic and after hours phone number is [phone], there is always someone available.

## 2017-09-07 LAB — CK: Total CK: 167 U/L (ref 24–173)

## 2017-09-07 LAB — TSH: TSH: 1.2 u[IU]/mL (ref 0.450–4.500)

## 2017-09-07 LAB — HEMOGLOBIN A1C
ESTIMATED AVERAGE GLUCOSE: 120 mg/dL
HEMOGLOBIN A1C: 5.8 % — AB (ref 4.8–5.6)

## 2017-09-08 MED ORDER — BUPROPION HCL 100 MG PO TABS: 100.0000 mg | ORAL_TABLET | Freq: Two times a day (BID) | ORAL | 2 refills | Status: DC

## 2017-09-08 NOTE — Assessment & Plan Note (Signed)
Blood pressure is well controlled today. HCTZ was discontinued at last office visit in December when her blood pressure was found to be consistently low normal.  - continued encouragement of  low sodium and exercise

## 2017-09-08 NOTE — Assessment & Plan Note (Signed)
Over about the past four months Amber [name] has been having daytime fatigue associated with non restorative sleep and mental fog. At times she has aching pain in her legs and generalized weakness but denies the feeling of widespread pain. She feels that she has been gaining weight and attributes this to an increased intake of sugary drinks. She has been told by her daughter that she has started snoring in her sleep, but her daughter did not note that she has had apneic episodes. She has been using trazodone to help her sleep but notices that the day after using that she feels even more fatigued.  She has no history of renal or hepatic dysfunction. Last BMP and CBC were 7 months ago and she had no electrolyte abnormalities or anemia. She is up to date on appropriate health maintenance screening for her age. She denies sore throat or lymphadenopathy and did not have any new sexual partners prior to the start of her symptoms. She denies peripheral edema, shortness of breath, or chest pain. Medication list includes rosuvastatin and Linzess which can be sedating however rosuvastatin was started years prior to her symptoms and Linzess was started once after her symptoms began.  - PHQ 2 score today is zero, GAD 7 score is 8 representing mild- moderate anxiety, will start Wellbutrin 100 mg BID  - nocturia, morning headaches, and snoring could be consistent with OSA - will order a sleep study  - The aching leg pain that she has associated with fatigue could be consistent with statin myopathy, however her CPK is within the normal range  - multiple features of thyroid disease and a family history of thyroid dysfunction however her TSH is 1.2 today  - CBC

## 2017-09-08 NOTE — Assessment & Plan Note (Signed)
PHQ 2 score today is zero, GAD 7 score is 8 representing mild- moderate anxiety, will start Wellbutrin 100 mg BID and encouraged her to seek out counseling at [facility]

## 2017-09-08 NOTE — Assessment & Plan Note (Signed)
-   well controlled with ranitidine 150 mg twice daily as needed  - GI has evaluated and does not feel that she needs to be on daily medication for this  

## 2017-09-08 NOTE — Assessment & Plan Note (Signed)
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  - residuals include slowed thinking  - continue risk factor modifications  - blood pressure well controlled today with lifestyle modifications  -  A1c today is 5.8, this is diet control and yearly A1c screening  - on rosuvastatin 20 mg daily, LDL was 76 when last checked in in 2018, continue yearly lipid profile  - continue aspirin 81 mg daily for secondary ppx

## 2017-09-08 NOTE — Progress Notes (Addendum)
CC: fatigue   HPI:  [name] is a 47 y.o. with PMH as listed below who presents for RLS, HTN, GERD, hx of cryptogenic ischemic stroke, GAD, HLD, PreDM. Please see the assessment and plans for the status of the patient chronic medical problems.    Past Medical History:  Diagnosis Date  . Asthma   . Depression    controlled  . Hyperlipidemia    controlled with medication  . Hypertension   . IBS (irritable bowel syndrome)   . Stroke [facility]) 03-13-2015   Review of Systems:  Refer to history of present illness and assessment and plans for pertinent review of systems, all others reviewed and negative  Physical Exam:  Vitals:   09/06/17 1516  BP: 125/88  Pulse: 81  Temp: 98.1 F (36.7 C)  TempSrc: Oral  SpO2: 98%  Weight: 178 lb 11.2 oz (81.1 kg)  Height: 5\' 2"  (1.575 m)   General: well appearing, no acute distress HEENT: conjunctiva are not pale   Cardiac: RRR, no murmur, normal S1 and S2, no peripheral edema  Pulm: no respiratory distress, lungs clear to auscultation  GI: soft, non distended  Neuro: no focal neurologic deficits  MSK: she does not have tender points    Assessment & Plan:   Fatigue  Over about the past four months Amber [name] has been having daytime fatigue associated with non restorative sleep and mental fog. At times she has aching pain in her legs and generalized weakness but denies the feeling of widespread pain. She feels that she has been gaining weight and attributes this to an increased intake of sugary drinks. She has been told by her daughter that she has started snoring in her sleep, but her daughter did not note that she has had apneic episodes. She has been using trazodone to help her sleep but notices that the day after using that she feels even more fatigued.  She has no history of renal or hepatic dysfunction. Last BMP and CBC were 7 months ago and she had no electrolyte abnormalities or anemia. She is up to date on appropriate health  maintenance screening for her age. She denies sore throat or lymphadenopathy and did not have any new sexual partners prior to the start of her symptoms. She denies peripheral edema, shortness of breath, or chest pain. Medication list includes rosuvastatin and Linzess which can be sedating however rosuvastatin was started years prior to her symptoms and Linzess was started once after her symptoms began.  - PHQ 2 score today is zero, GAD 7 score is 8 representing mild- moderate anxiety, will start Wellbutrin 100 mg BID and encouraged her to seek out counseling at [facility]  - nocturia, morning headaches, and snoring could be consistent with OSA - will order a sleep study  - The aching leg pain that she has associated with fatigue could be consistent with statin myopathy, however her CPK is within the normal range  - multiple features of thyroid disease and a family history of thyroid dysfunction however her TSH is 1.2 today - some features of fibromyalgia, encouraged slow increase in activity level   Addendum 6/7: Amber [name] was asked to return for a lab only visit for CBC and has not yet returned, this will be addressed at follow up.   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  - residuals include slowed thinking  - continue risk factor modifications  - blood pressure well controlled today with lifestyle modifications  -  A1c today is 5.8, this is diet control and yearly A1c screening  - on rosuvastatin 20 mg daily, LDL was 76 when last checked in in 2018, continue yearly lipid profile  - continue aspirin 81 mg daily for secondary ppx   Hypertension  Blood  pressure is well controlled today. HCTZ was discontinued at last office visit in December when her blood pressure was found to be consistently low normal.  - continued encouragement of  low sodium and exercise  See Encounters Tab for problem based charting.  Patient discussed with Dr. Cleda Daub

## 2017-09-08 NOTE — Assessment & Plan Note (Signed)
-   well controlled with ranitidine 150 mg twice daily as needed  - GI has evaluated and does not feel that she needs to be on daily medication for this

## 2017-09-09 ENCOUNTER — Ambulatory Visit (INDEPENDENT_AMBULATORY_CARE_PROVIDER_SITE_OTHER): Payer: Medicaid Other | Admitting: *Deleted

## 2017-09-09 DIAGNOSIS — I639 Cerebral infarction, unspecified: Secondary | ICD-10-CM | POA: Diagnosis not present

## 2017-09-12 DIAGNOSIS — H5213 Myopia, bilateral: Secondary | ICD-10-CM | POA: Diagnosis not present

## 2017-09-12 NOTE — Progress Notes (Signed)
Internal Medicine Clinic Attending  Case discussed with Dr. Blum at the time of the visit.  We reviewed the resident's history and exam and pertinent patient test results.  I agree with the assessment, diagnosis, and plan of care documented in the resident's note. 

## 2017-09-12 NOTE — Progress Notes (Signed)
Carelink Summary Report / Loop Recorder 

## 2017-09-15 LAB — CUP PACEART REMOTE DEVICE CHECK
Date Time Interrogation Session: 20190310101115
Implantable Pulse Generator Implant Date: 20161017

## 2017-09-27 ENCOUNTER — Ambulatory Visit (HOSPITAL_BASED_OUTPATIENT_CLINIC_OR_DEPARTMENT_OTHER): Payer: Medicaid Other | Attending: Internal Medicine | Admitting: Internal Medicine

## 2017-09-27 VITALS — Ht 62.0 in | Wt 175.0 lb

## 2017-09-27 DIAGNOSIS — G4733 Obstructive sleep apnea (adult) (pediatric): Secondary | ICD-10-CM | POA: Diagnosis not present

## 2017-09-27 DIAGNOSIS — R5383 Other fatigue: Secondary | ICD-10-CM | POA: Diagnosis present

## 2017-10-02 DIAGNOSIS — G4733 Obstructive sleep apnea (adult) (pediatric): Secondary | ICD-10-CM | POA: Diagnosis not present

## 2017-10-02 NOTE — Procedures (Signed)
Patient Name: Amber [name], Amber [name] Date: 09/27/2017 Gender: Female D.O.B: November 18, 1970 Age (years): 47 Referring Provider: Alvira Philips DO Height (inches): 62 Interpreting Physician: Jetty Duhamel MD, ABSM Weight (lbs): 175 RPSGT: Shelah Lewandowsky BMI: 32 MRN: [id number] Neck Size: 15.50  CLINICAL INFORMATION Sleep Study Type: NPSG Indication for sleep study: Fatigue, Hypertension, Morning Headaches, Obesity, Snoring  Epworth Sleepiness Score: 10  SLEEP STUDY TECHNIQUE As per the AASM Manual for the Scoring of Sleep and Associated Events v2.3 (April 2016) with a hypopnea requiring 4% desaturations.  The channels recorded and monitored were frontal, central and occipital EEG, electrooculogram (EOG), submentalis EMG (chin), nasal and oral airflow, thoracic and abdominal wall motion, anterior tibialis EMG, snore microphone, electrocardiogram, and pulse oximetry.  MEDICATIONS Medications self-administered by patient taken the night of the study : none reported  SLEEP ARCHITECTURE The study was initiated at 10:15:54 PM and ended at 5:34:21 AM.  Sleep onset time was 13.6 minutes and the sleep efficiency was 80.5%%. The total sleep time was 353 minutes.  Stage REM latency was 120.5 minutes.  The patient spent 8.5%% of the night in stage N1 sleep, 72.2%% in stage N2 sleep, 0.0%% in stage N3 and 19.26% in REM.  Alpha intrusion was absent.  Supine sleep was 38.95%.  RESPIRATORY PARAMETERS The overall apnea/hypopnea index (AHI) was 6.3 per hour. There were 0 total apneas, including 0 obstructive, 0 central and 0 mixed apneas. There were 37 hypopneas and 49 RERAs.  The AHI during Stage REM sleep was 30.0 per hour.  AHI while supine was 16.1 per hour.  The mean oxygen saturation was 94.9%. The minimum SpO2 during sleep was 85.0%.  moderate snoring was noted during this study.  CARDIAC DATA The 2 lead EKG demonstrated sinus rhythm. The mean heart rate was 87.9 beats per  minute. Other EKG findings include: None.  LEG MOVEMENT DATA The total PLMS were 0 with a resulting PLMS index of 0.0. Associated arousal with leg movement index was 0.0 .  IMPRESSIONS - Mild obstructive sleep apnea occurred during this study (AHI = 6.3/h). REM AHI 30/ hr. - No significant central sleep apnea occurred during this study (CAI = 0.0/h). - Mild oxygen desaturation was noted during this study (Min O2 = 85.0%). - The patient snored with moderate snoring volume. - No cardiac abnormalities were noted during this study. - Clinically significant periodic limb movements did not occur during sleep. No significant associated arousals.  DIAGNOSIS - Obstructive Sleep Apnea (327.23 [G47.33 ICD-10])  RECOMMENDATIONS - Treatment of mild OSA is directed at symptoms. If intervention is needed, conservative measures including normal weight, sleep off back, a chin strap or oral appliance might be considered. Other inteventions would be based on clinical judgment. - Positional therapy avoiding supine position during sleep. - Be careful with alcohol, sedatives and other CNS depressants that may worsen sleep apnea and disrupt normal sleep architecture. - Sleep hygiene should be reviewed to assess factors that may improve sleep quality. - Weight management and regular exercise should be initiated or continued if appropriate.  [Electronically signed] 10/02/2017 10:34 AM  Jetty Duhamel MD, ABSM Diplomate, American Board of Sleep Medicine   NPI: [id number]                          Jetty Duhamel Diplomate, American Board of Sleep Medicine  ELECTRONICALLY SIGNED ON:  10/02/2017, 10:28 AM [facility] PH: [phone]   FX: [phone] ACCREDITED BY THE  [place]

## 2017-10-05 ENCOUNTER — Other Ambulatory Visit: Payer: Self-pay | Admitting: Internal Medicine

## 2017-10-05 ENCOUNTER — Other Ambulatory Visit: Payer: Self-pay | Admitting: Primary Care

## 2017-10-05 DIAGNOSIS — E785 Hyperlipidemia, unspecified: Secondary | ICD-10-CM

## 2017-10-05 NOTE — Telephone Encounter (Signed)
She just saw Dr. Obie Dredge in April. Not clear she wanted to keep her on trazadone. I am reluctant to give 11 refills. I refilled x 1. She is due to see Dr Obie Dredge on 12/08/17 but I do not see that this appt was scheduled.

## 2017-10-10 ENCOUNTER — Telehealth: Payer: Self-pay

## 2017-10-10 NOTE — Telephone Encounter (Signed)
I am unfamiliar with this patient and sleep study is not an urgent referral. I will defer to PCP Dr. Obie Dredge who should be back this week to address this. Thank you

## 2017-10-10 NOTE — Telephone Encounter (Signed)
Requesting sleep study results. Please call back.  

## 2017-10-12 ENCOUNTER — Ambulatory Visit (INDEPENDENT_AMBULATORY_CARE_PROVIDER_SITE_OTHER): Payer: Medicaid Other | Admitting: *Deleted

## 2017-10-12 DIAGNOSIS — I639 Cerebral infarction, unspecified: Secondary | ICD-10-CM | POA: Diagnosis not present

## 2017-10-12 LAB — CUP PACEART REMOTE DEVICE CHECK
Date Time Interrogation Session: 20190412100658
Implantable Pulse Generator Implant Date: 20161017

## 2017-10-13 NOTE — Progress Notes (Signed)
Carelink Summary Report / Loop Recorder 

## 2017-10-19 NOTE — Telephone Encounter (Signed)
I attempted to return a call to Ms. Loudermilk, the call went straight to voicemail. I will continue attempting to return her call to communicate sleep study results.

## 2017-11-01 ENCOUNTER — Encounter: Payer: Self-pay | Admitting: Internal Medicine

## 2017-11-01 ENCOUNTER — Other Ambulatory Visit: Payer: Self-pay

## 2017-11-01 ENCOUNTER — Ambulatory Visit: Payer: Medicaid Other | Admitting: Internal Medicine

## 2017-11-01 VITALS — BP 129/88 | HR 86 | Temp 98.1°F | Ht 62.0 in | Wt 180.9 lb

## 2017-11-01 DIAGNOSIS — G4700 Insomnia, unspecified: Secondary | ICD-10-CM

## 2017-11-01 DIAGNOSIS — F1099 Alcohol use, unspecified with unspecified alcohol-induced disorder: Secondary | ICD-10-CM | POA: Diagnosis not present

## 2017-11-01 DIAGNOSIS — G4733 Obstructive sleep apnea (adult) (pediatric): Secondary | ICD-10-CM | POA: Diagnosis not present

## 2017-11-01 DIAGNOSIS — G2581 Restless legs syndrome: Secondary | ICD-10-CM

## 2017-11-01 DIAGNOSIS — Z79899 Other long term (current) drug therapy: Secondary | ICD-10-CM | POA: Diagnosis not present

## 2017-11-01 DIAGNOSIS — I69318 Other symptoms and signs involving cognitive functions following cerebral infarction: Secondary | ICD-10-CM | POA: Diagnosis not present

## 2017-11-01 DIAGNOSIS — F329 Major depressive disorder, single episode, unspecified: Secondary | ICD-10-CM

## 2017-11-01 DIAGNOSIS — F411 Generalized anxiety disorder: Secondary | ICD-10-CM

## 2017-11-01 DIAGNOSIS — I10 Essential (primary) hypertension: Secondary | ICD-10-CM | POA: Diagnosis not present

## 2017-11-01 DIAGNOSIS — F5101 Primary insomnia: Secondary | ICD-10-CM

## 2017-11-01 MED ORDER — PRAMIPEXOLE DIHYDROCHLORIDE 0.125 MG PO TABS: 0.1250 mg | ORAL_TABLET | Freq: Every day | ORAL | 0 refills | Status: DC

## 2017-11-01 MED ORDER — SERTRALINE HCL 25 MG PO TABS: 25.0000 mg | ORAL_TABLET | Freq: Every day | ORAL | 0 refills | Status: DC

## 2017-11-01 NOTE — Assessment & Plan Note (Signed)
Assessment Patient still has poor sleep hygiene habits. Discussed appropriate sleep hygiene and the importance of restful sleep. Patient amenable to trying to work on this.  Plan - Counseled regarding good sleep hygiene - Pramipexole for RLS, as listed in separate A&P

## 2017-11-01 NOTE — Patient Instructions (Signed)
FOLLOW-UP INSTRUCTIONS When: 01/03/2018 (appointment already scheduled) For: follow up of anxiety and insomnia What to bring: medications   Amber [name],  It was nice to meet you today.  The two big things we are working on today are anxiety and insomnia.  First, for the anxiety, please STOP taking bupropion. We will start you on zoloft 25mg  once a day. This medicine typically takes about 4-6 weeks to take its full effect. If it is not working, we can increase the dose at your next visit. The other thing that is very helpful is getting in to see a counselor. [facility] is a free clinic that can help out - please try to go see them if you can.  For your insomnia, the most important thing is making sure you have good sleep hygiene. Here are some tips to help: - Be consistent. Go to bed at the same time each night and get up at the same time each morning, including on the weekends - Make sure your bedroom is quiet, dark, relaxing, and at a comfortable temperature - Remove electronic devices, such as TVs, computers, and smart phones, from the bedroom - Avoid large meals, caffeine, and alcohol before bedtime - Get some exercise. Being physically active during the day can help you fall asleep more easily at night.  The other thing we will restart for your legs at night is pramipexole. Take 0.125mg  of pramipexole once at bedtime.  If you have any questions or concerns, call our clinic at [phone] or after hours call [phone] and ask for the internal medicine resident on call.

## 2017-11-01 NOTE — Assessment & Plan Note (Signed)
Assessment GAD-7 score is 8 today, she was started on bupropion at her last visit, but reports no improvement since being on this medication. Denies SI/HI. She has not yet been to [facility] for counseling. States she has never tried an SSRI before.  Plan - Stop bupropion - Start sertraline 25mg  daily (can uptitrate dose if no improvement) - Encouraged patient to go to [facility] for counseling

## 2017-11-01 NOTE — Progress Notes (Signed)
   CC: anxiety and insomnia  HPI:  Amber [name] is a 47 y.o. female with PMH of HTN, depression/anxiety, and cryptogenic stroke in 03/2015 (residual deficit of slowed thinking) who presents with anxiety and insomnia.  Anxiety: Patient seen in clinic in April and was started on bupropion for GAD. GAD-7 score today is 8. She denies SI/HI. She reports adherence to bupropion but no improvement since being on this medication. She states she has never tried an SSRI. Also was advised to go to [facility] for counseling, but was not able to make it to see them.  Insomnia: Patient has been dealing with trouble sleeping for a while. She states she typically goes to sleep around 12a-3a and wakes up at 7:30am. She reports that she feels rested on some mornings. She does not have a bedtime routine. She does watch TV while in bed but sets a timer for 45 minutes so that if she is asleep by then, the TV will turn itself off. She tries not to use her phone when she is in bed. She drinks a glass of wine in the evening about 3 times per week. She does not drink coffee but drinks soda occasionally, the latest she will have a soda is 10pm. She takes trazodone every other night to help her with sleep, which she reports does help. She also has been trying to avoid napping during the daytime. She also notes leg pains at night that sometimes make it difficult for her to fall asleep. She was previously prescribed pramipexole, which she states was helpful, however she is not sure why she stopped taking it.  Results of sleep study were discussed with patient. Discussed that she has mild OSA and management for that is good sleep hygiene, weight loss, and regular exercise.  Please see the assessment and plan below for the status of the patient's chronic medical problems.  Past Medical History:  Diagnosis Date  . Asthma   . Depression    controlled  . Hyperlipidemia    controlled with medication  . Hypertension   . IBS  (irritable bowel syndrome)   . Stroke [facility]) 03-13-2015   Review of Systems:  GEN: Negative for fevers CV: Negative for chest pain  PULM: Negative for cough or SOB EXT: Negative for LE swelling PSYCH: Positive for anxiety  Physical Exam:  Vitals:   11/01/17 0917  BP: 129/88  Pulse: 86  Temp: 98.1 F (36.7 C)  TempSrc: Oral  SpO2: 98%  Weight: 180 lb 14.4 oz (82.1 kg)  Height: 5\' 2"  (1.575 m)   GEN: Sitting comfortably in chair in NAD, pink hair CV: NR & RR, no m/r/g PULM: CTAB, no wheezes or rales EXT: No LE edema  Assessment & Plan:   See Encounters Tab for problem based charting.  Patient discussed with Dr. Sandre Kitty

## 2017-11-01 NOTE — Assessment & Plan Note (Addendum)
Assessment Endorses leg pains at night that sometimes make it difficult for her to fall asleep. She was previously on pramipexole which was helpful but cannot remember why she stopped taking it.  Plan - Re-start pramipexole 0.125mg  QHS - Encouraged daily exercise

## 2017-11-02 NOTE — Progress Notes (Signed)
Internal Medicine Clinic Attending  Case discussed with Dr. Renaldo Reel  at the time of the visit.  We reviewed the resident's history and exam and pertinent patient test results.  I agree with the assessment, diagnosis, and plan of care documented in the resident's note.  Agree with switch of bupropion to sertraline, as SSRI has better evidence for use in anxiety. Would also benefit from improved sleep, and focus on sleep hygiene has great potential for her. Restarting pramipexole may also help her sleep.  Anne Shutter, MD

## 2017-11-04 LAB — CUP PACEART REMOTE DEVICE CHECK
Implantable Pulse Generator Implant Date: 20161017
MDC IDC SESS DTM: 20190515121000

## 2017-11-04 NOTE — Addendum Note (Signed)
Addended by: Earl Lagos on: 11/04/2017 01:50 PM   Modules accepted: Orders

## 2017-11-14 ENCOUNTER — Ambulatory Visit (INDEPENDENT_AMBULATORY_CARE_PROVIDER_SITE_OTHER): Payer: Medicaid Other | Admitting: *Deleted

## 2017-11-14 DIAGNOSIS — I639 Cerebral infarction, unspecified: Secondary | ICD-10-CM | POA: Diagnosis not present

## 2017-11-14 NOTE — Progress Notes (Signed)
Carelink Summary Report / Loop Recorder 

## 2017-11-28 ENCOUNTER — Telehealth: Payer: Self-pay | Admitting: Cardiology

## 2017-11-28 NOTE — Telephone Encounter (Signed)
Spoke w/ pt and requested that she send a manual transmission b/c her home monitor has not updated in at least 14 days.   

## 2017-12-16 LAB — CUP PACEART REMOTE DEVICE CHECK
Date Time Interrogation Session: 20190617120915
MDC IDC PG IMPLANT DT: 20161017

## 2017-12-19 ENCOUNTER — Ambulatory Visit (INDEPENDENT_AMBULATORY_CARE_PROVIDER_SITE_OTHER): Payer: Medicaid Other | Admitting: *Deleted

## 2017-12-19 DIAGNOSIS — I639 Cerebral infarction, unspecified: Secondary | ICD-10-CM | POA: Diagnosis not present

## 2017-12-19 NOTE — Progress Notes (Signed)
Carelink Summary Report / Loop Recorder 

## 2018-01-03 ENCOUNTER — Encounter: Payer: Medicaid Other | Admitting: Internal Medicine

## 2018-01-17 ENCOUNTER — Encounter: Payer: Medicaid Other | Admitting: Internal Medicine

## 2018-01-19 ENCOUNTER — Ambulatory Visit (INDEPENDENT_AMBULATORY_CARE_PROVIDER_SITE_OTHER): Payer: Medicaid Other | Admitting: *Deleted

## 2018-01-19 DIAGNOSIS — I639 Cerebral infarction, unspecified: Secondary | ICD-10-CM

## 2018-01-19 NOTE — Progress Notes (Signed)
Carelink Summary Report / Loop Recorder 

## 2018-01-24 ENCOUNTER — Encounter: Payer: Self-pay | Admitting: Internal Medicine

## 2018-01-24 ENCOUNTER — Other Ambulatory Visit: Payer: Self-pay

## 2018-01-24 ENCOUNTER — Ambulatory Visit: Payer: Medicaid Other | Admitting: Internal Medicine

## 2018-01-24 DIAGNOSIS — Z79899 Other long term (current) drug therapy: Secondary | ICD-10-CM | POA: Diagnosis not present

## 2018-01-24 DIAGNOSIS — Z23 Encounter for immunization: Secondary | ICD-10-CM

## 2018-01-24 DIAGNOSIS — E785 Hyperlipidemia, unspecified: Secondary | ICD-10-CM

## 2018-01-24 DIAGNOSIS — F411 Generalized anxiety disorder: Secondary | ICD-10-CM | POA: Diagnosis not present

## 2018-01-24 DIAGNOSIS — G2581 Restless legs syndrome: Secondary | ICD-10-CM

## 2018-01-24 DIAGNOSIS — I639 Cerebral infarction, unspecified: Secondary | ICD-10-CM

## 2018-01-24 DIAGNOSIS — F329 Major depressive disorder, single episode, unspecified: Secondary | ICD-10-CM | POA: Diagnosis not present

## 2018-01-24 DIAGNOSIS — K219 Gastro-esophageal reflux disease without esophagitis: Secondary | ICD-10-CM

## 2018-01-24 DIAGNOSIS — R7303 Prediabetes: Secondary | ICD-10-CM

## 2018-01-24 DIAGNOSIS — Z8673 Personal history of transient ischemic attack (TIA), and cerebral infarction without residual deficits: Secondary | ICD-10-CM

## 2018-01-24 DIAGNOSIS — F339 Major depressive disorder, recurrent, unspecified: Secondary | ICD-10-CM

## 2018-01-24 NOTE — Progress Notes (Addendum)
   CC: follow up of GERD  HPI:  [name] is a 47 y.o. with PMH RLS, GERD, hx of cryptogenic ischemic stroke, GAD, HLD, PreDM who presents for follow up of GERD. Please see the assessment and plans for the status of the patient chronic medical problems.   Review of Systems:  Refer to history of present illness and assessment and plans for pertinent review of systems, all others reviewed and negative  Physical Exam:  Vitals:   01/24/18 1347  BP: 128/87  Pulse: 66  Temp: 98.4 F (36.9 C)  TempSrc: Oral  SpO2: 99%  Weight: 179 lb 14.4 oz (81.6 kg)  Height: 5\' 2"  (1.575 m)   General: well appearing, no acute distress  Cardiac: regular rate and rhythm, no murmur  Pulm: Lungs clear to auscultation, normal work of breathing  GI: abdomen soft, non tender, non distended   Assessment & Plan:   Hx of cryptogenic stroke  No further symptoms of stroke since our last office visit and no signs of focal neurologic deficits on my exam today. She continues to feel that her organization and thinking is delayed, we discussed options to help with this such as making list to keep track of the task that she needs to complete in order of priority. 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  - notes reveal last interrogated this time last year and was without evidence of atrial fibrillation  - continue rosuvastatin 20 mg qd  - continue to encourage weight loss and healthy eating   GERD  Has symptoms of GERD when she misses a dose of ranitidine but does not experience these symptoms when she takes the medication regularly.  - continue ranitidine 150 mg qd   RLS  Symptoms are well controlled with pramipexole.  - continue pramipexole 0.125 mg daily  Depression  Describes symptoms as being well controlled and she is no longer using zoloft or trazodone.  - will remove zoloft and trazodone from  medication list   Preventative health  - received flu vaccine today  - had pap smear recently at an outside facility, she will have the results sent to our office.   See Encounters Tab for problem based charting.  Patient discussed with Dr. Sandre Kitty

## 2018-01-24 NOTE — Patient Instructions (Addendum)
Thank you for coming to the clinic today. It was a pleasure to see you.   For your heartburn, continue taking the ranitidine   For the history of stroke, continue taking rosuvastatin   Today we gave you your flu shot   FOLLOW-UP INSTRUCTIONS When: as needed with Dr. Obie Dredge   For:  What to bring: all of your medication bottles   Please call the internal medicine center clinic if you have any questions or concerns, we may be able to help and keep you from a long and expensive emergency room wait. Our clinic and after hours phone number is [phone], the best time to call is Monday through Friday 9 am to 4 pm but there is always someone available 24/7 if you have an emergency. If you need medication refills please notify your pharmacy one week in advance and they will send [place] a request.

## 2018-01-25 ENCOUNTER — Other Ambulatory Visit: Payer: Self-pay | Admitting: Internal Medicine

## 2018-01-25 ENCOUNTER — Encounter: Payer: Self-pay | Admitting: Internal Medicine

## 2018-01-25 DIAGNOSIS — E785 Hyperlipidemia, unspecified: Secondary | ICD-10-CM

## 2018-01-25 NOTE — Assessment & Plan Note (Signed)
Symptoms are well controlled with pramipexole.  - continue pramipexole 0.125 mg daily

## 2018-01-25 NOTE — Assessment & Plan Note (Signed)
Has symptoms of GERD when she misses a dose of ranitidine but does not experience these symptoms when she takes the medication regularly.  - continue ranitidine 150 mg qd

## 2018-01-25 NOTE — Assessment & Plan Note (Signed)
No further symptoms of stroke since our last office visit and no signs of focal neurologic deficits on my exam today. She continues to feel that her organization and thinking is delayed, we discussed options to help with this such as making list to keep track of the task that she needs to complete in order of priority. 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  - notes reveal last interrogated this time last year and was without evidence of atrial fibrillation  - continue rosuvastatin 20 mg qd  - continue to encourage weight loss and healthy eating

## 2018-01-26 NOTE — Progress Notes (Signed)
Internal Medicine Clinic Attending  Case discussed with Dr. Blum at the time of the visit.  We reviewed the resident's history and exam and pertinent patient test results.  I agree with the assessment, diagnosis, and plan of care documented in the resident's note.  Alexander Raines, M.D., Ph.D.  

## 2018-02-02 LAB — CUP PACEART REMOTE DEVICE CHECK
Date Time Interrogation Session: 20190720124001
Implantable Pulse Generator Implant Date: 20161017

## 2018-02-21 ENCOUNTER — Ambulatory Visit (INDEPENDENT_AMBULATORY_CARE_PROVIDER_SITE_OTHER): Payer: Medicaid Other | Admitting: *Deleted

## 2018-02-21 ENCOUNTER — Encounter: Payer: Medicaid Other | Admitting: Internal Medicine

## 2018-02-21 DIAGNOSIS — I639 Cerebral infarction, unspecified: Secondary | ICD-10-CM

## 2018-02-21 LAB — CUP PACEART REMOTE DEVICE CHECK
Date Time Interrogation Session: 20190822154055
Implantable Pulse Generator Implant Date: 20161017

## 2018-02-22 NOTE — Progress Notes (Signed)
Carelink Summary Report / Loop Recorder 

## 2018-02-27 ENCOUNTER — Encounter: Payer: Self-pay | Admitting: Internal Medicine

## 2018-02-27 ENCOUNTER — Other Ambulatory Visit: Payer: Self-pay

## 2018-02-27 ENCOUNTER — Ambulatory Visit: Payer: Medicaid Other | Admitting: Internal Medicine

## 2018-02-27 VITALS — BP 126/85 | HR 84 | Temp 98.3°F | Ht 62.0 in | Wt 181.2 lb

## 2018-02-27 DIAGNOSIS — Z8673 Personal history of transient ischemic attack (TIA), and cerebral infarction without residual deficits: Secondary | ICD-10-CM

## 2018-02-27 DIAGNOSIS — R4789 Other speech disturbances: Secondary | ICD-10-CM | POA: Diagnosis not present

## 2018-02-27 DIAGNOSIS — E785 Hyperlipidemia, unspecified: Secondary | ICD-10-CM | POA: Diagnosis not present

## 2018-02-27 DIAGNOSIS — K219 Gastro-esophageal reflux disease without esophagitis: Secondary | ICD-10-CM

## 2018-02-27 DIAGNOSIS — F411 Generalized anxiety disorder: Secondary | ICD-10-CM

## 2018-02-27 DIAGNOSIS — Z79899 Other long term (current) drug therapy: Secondary | ICD-10-CM | POA: Diagnosis not present

## 2018-02-27 DIAGNOSIS — G2581 Restless legs syndrome: Secondary | ICD-10-CM

## 2018-02-27 DIAGNOSIS — R7303 Prediabetes: Secondary | ICD-10-CM | POA: Diagnosis not present

## 2018-02-27 HISTORY — DX: Other speech disturbances: R47.89

## 2018-02-27 LAB — CUP PACEART REMOTE DEVICE CHECK
Implantable Pulse Generator Implant Date: 20161017
MDC IDC SESS DTM: 20190924181027

## 2018-02-27 NOTE — Progress Notes (Signed)
   CC: concern for ongoing difficulties with concentration   HPI:  Amber [name] is a 47 y.o. with PMHRLS, GERD, hx of cryptogenic ischemic stroke, GAD, HLD, PreDMwho presents for follow up of difficulties with concentration. Please see the assessment and plans for the status of the patient chronic medical problems.  Review of Systems:  Refer to history of present illness and assessment and plans for pertinent review of systems, all others reviewed and negative  Physical Exam:  Vitals:   02/27/18 1107  BP: 126/85  Pulse: 84  Temp: 98.3 F (36.8 C)  TempSrc: Oral  SpO2: 98%  Weight: 181 lb 3.2 oz (82.2 kg)  Height: 5\' 2"  (1.575 m)   General: well appearing, no acute distress  Cardiac: regular rate and rhythm, no murmur, no peripheral edema  Pulm: normal work of breathing, lungs clear to auscultation Neuro: cranial nerves intact, no retinal abnormalities appreciated, sensation to light touch intact over the face and extremities, vibration sense intact over distal extremities, biceps and patellar reflexes 2+ bilateral, proximal and distal strength of the upper and lower extremities is normal and equal, orientation and naming is normal   Assessment & Plan:   Word finding difficulties  History of cryptogenic stroke  Amber [name] continues to report difficulties with her memory which have dated back to the time of her stroke in 2016. She feels that she is most affected by word finding difficulties and difficulties with concentration and these are most difficult for her at work, she also has a time time with keeping her schedule organized. She feels that her ability to complete her work is slower than it should be, she thinks that it should take 30 minutes to complete each note but it is taking an hour for each notes. She attributes this to difficulties with finding the correct words. She has tried coping mechanisms such as keeping a consistent work place and decreasing distractions.  She has still been able to maintain the workload to keep 3 clients where she expects herself to be able to keep up with 4 clients. She has improved her sleep hygiene and feels that the difficulties that she had with insomnia in the past are improved. Mini cog today scored 4/5, she was not able to recall the third word with 3 word recall. PHQ testing today with score of 4. She does take pramipexole which has a side effect of drowsiness. She is on linzess as well which has a small change of side effect of fatigue.  - blood pressure is controlled today and below goal  - continue rosuvastatin 20 mg daily  - last loop recorder check was 02/21/2018 and showed no new afib  - exam and data are unrevealing of a cause of her continued difficulties with concentration, will refer to neuropsych for further workup and recommendations  - referral to SLP for more work on word finding difficulties   See Encounters Tab for problem based charting.  Patient discussed with Dr. Heide Spark

## 2018-02-27 NOTE — Assessment & Plan Note (Signed)
Amber [name] continues to report difficulties with her memory which have dated back to the time of her stroke in 2016. She feels that she is most affected by word finding difficulties and difficulties with concentration and these are most difficult for her at work, she also has a time time with keeping her schedule organized. She feels that her ability to complete her work is slower than it should be, she thinks that it should take 30 minutes to complete each note but it is taking an hour for each notes. She attributes this to difficulties with finding the correct words. She has tried coping mechanisms such as keeping a consistent work place and decreasing distractions. She has still been able to maintain the workload to keep 3 clients where she expects herself to be able to keep up with 4 clients. She has improved her sleep hygiene and feels that the difficulties that she had with insomnia in the past are improved. Mini cog today scored 4/5, she was not able to recall the third word with 3 word recall. PHQ testing today with score of 4. She does take pramipexole which has a side effect of drowsiness. She is on linzess as well which has a small change of side effect of fatigue.  - blood pressure is controlled today and below goal  - continue rosuvastatin 20 mg daily  - last loop recorder check was 02/21/2018 and showed no new afib  - exam and data are unrevealing of a cause of her continued difficulties with concentration, will refer to neuropsych for further workup and recommendations  - referral to SLP for more work on word finding difficulties

## 2018-02-27 NOTE — Patient Instructions (Signed)
Thank you for coming to the clinic today. It was a pleasure to see you.   For your difficulties with concentration and finding words, I have referred you for specialized testing with a neuropsychologist   FOLLOW-UP INSTRUCTIONS When: 1 year with Dr. Obie Dredge  For: follow up of your blood pressure  What to bring: all of your medication bottles   Please call the internal medicine center clinic if you have any questions or concerns, we may be able to help and keep you from a long and expensive emergency room wait. Our clinic and after hours phone number is [phone], the best time to call is Monday through Friday 9 am to 4 pm but there is always someone available 24/7 if you have an emergency. If you need medication refills please notify your pharmacy one week in advance and they will send [place] a request.

## 2018-03-02 ENCOUNTER — Telehealth: Payer: Self-pay | Admitting: *Deleted

## 2018-03-02 NOTE — Telephone Encounter (Signed)
CALLED LVM FOR [name] TO RETURN CALL REGARDING NEUROPSYCH  REFERRAL FOR PATIENT. [phone].

## 2018-03-02 NOTE — Progress Notes (Signed)
Internal Medicine Clinic Attending  Case discussed with Dr. Blum at the time of the visit.  We reviewed the resident's history and exam and pertinent patient test results.  I agree with the assessment, diagnosis, and plan of care documented in the resident's note. 

## 2018-03-27 ENCOUNTER — Ambulatory Visit (INDEPENDENT_AMBULATORY_CARE_PROVIDER_SITE_OTHER): Payer: Medicaid Other | Admitting: *Deleted

## 2018-03-27 DIAGNOSIS — I639 Cerebral infarction, unspecified: Secondary | ICD-10-CM

## 2018-03-27 NOTE — Progress Notes (Signed)
Carelink Summary Report / Loop Recorder 

## 2018-04-19 LAB — CUP PACEART REMOTE DEVICE CHECK
MDC IDC PG IMPLANT DT: 20161017
MDC IDC SESS DTM: 20191027180829

## 2018-05-01 ENCOUNTER — Ambulatory Visit (INDEPENDENT_AMBULATORY_CARE_PROVIDER_SITE_OTHER): Payer: Medicaid Other

## 2018-05-01 DIAGNOSIS — I639 Cerebral infarction, unspecified: Secondary | ICD-10-CM

## 2018-05-01 NOTE — Progress Notes (Signed)
Carelink Summary Report / Loop Recorder 

## 2018-05-29 LAB — CUP PACEART REMOTE DEVICE CHECK
Date Time Interrogation Session: 20191202091036
Implantable Pulse Generator Implant Date: 20161017

## 2018-06-05 ENCOUNTER — Ambulatory Visit (INDEPENDENT_AMBULATORY_CARE_PROVIDER_SITE_OTHER): Payer: Medicaid Other

## 2018-06-05 DIAGNOSIS — I639 Cerebral infarction, unspecified: Secondary | ICD-10-CM | POA: Diagnosis not present

## 2018-06-06 LAB — CUP PACEART REMOTE DEVICE CHECK
Date Time Interrogation Session: 20200104092052
Implantable Pulse Generator Implant Date: 20161017

## 2018-06-06 NOTE — Progress Notes (Signed)
Carelink Summary Report / Loop Recorder 

## 2018-06-13 ENCOUNTER — Encounter: Payer: Self-pay | Admitting: Internal Medicine

## 2018-07-06 ENCOUNTER — Ambulatory Visit (INDEPENDENT_AMBULATORY_CARE_PROVIDER_SITE_OTHER): Payer: Medicaid Other

## 2018-07-06 DIAGNOSIS — I639 Cerebral infarction, unspecified: Secondary | ICD-10-CM

## 2018-07-07 LAB — CUP PACEART REMOTE DEVICE CHECK
Date Time Interrogation Session: 20200206124115
Implantable Pulse Generator Implant Date: 20161017

## 2018-07-14 ENCOUNTER — Telehealth: Payer: Self-pay

## 2018-07-14 NOTE — Telephone Encounter (Signed)
Left message for patient regarding disconnected monitor.  

## 2018-07-17 DIAGNOSIS — Z0000 Encounter for general adult medical examination without abnormal findings: Secondary | ICD-10-CM | POA: Diagnosis not present

## 2018-07-17 DIAGNOSIS — Z1231 Encounter for screening mammogram for malignant neoplasm of breast: Secondary | ICD-10-CM | POA: Diagnosis not present

## 2018-07-18 NOTE — Progress Notes (Signed)
Carelink Summary Report / Loop Recorder 

## 2018-07-24 ENCOUNTER — Encounter (HOSPITAL_COMMUNITY): Payer: Self-pay | Admitting: Emergency Medicine

## 2018-07-24 ENCOUNTER — Telehealth: Payer: Self-pay

## 2018-07-24 ENCOUNTER — Other Ambulatory Visit: Payer: Self-pay

## 2018-07-24 ENCOUNTER — Emergency Department (HOSPITAL_COMMUNITY)
Admission: EM | Admit: 2018-07-24 | Discharge: 2018-07-24 | Disposition: A | Discharge status: Home / Self Care | Payer: Medicaid Other | Attending: Emergency Medicine | Admitting: Emergency Medicine

## 2018-07-24 DIAGNOSIS — M542 Cervicalgia: Secondary | ICD-10-CM | POA: Diagnosis present

## 2018-07-24 DIAGNOSIS — Y939 Activity, unspecified: Secondary | ICD-10-CM | POA: Insufficient documentation

## 2018-07-24 DIAGNOSIS — I10 Essential (primary) hypertension: Secondary | ICD-10-CM | POA: Insufficient documentation

## 2018-07-24 DIAGNOSIS — Z79899 Other long term (current) drug therapy: Secondary | ICD-10-CM | POA: Diagnosis not present

## 2018-07-24 DIAGNOSIS — Y999 Unspecified external cause status: Secondary | ICD-10-CM | POA: Diagnosis not present

## 2018-07-24 DIAGNOSIS — T148XXA Other injury of unspecified body region, initial encounter: Secondary | ICD-10-CM

## 2018-07-24 DIAGNOSIS — J45909 Unspecified asthma, uncomplicated: Secondary | ICD-10-CM | POA: Diagnosis not present

## 2018-07-24 DIAGNOSIS — Y929 Unspecified place or not applicable: Secondary | ICD-10-CM | POA: Diagnosis not present

## 2018-07-24 DIAGNOSIS — S29012A Strain of muscle and tendon of back wall of thorax, initial encounter: Secondary | ICD-10-CM | POA: Insufficient documentation

## 2018-07-24 DIAGNOSIS — S161XXA Strain of muscle, fascia and tendon at neck level, initial encounter: Secondary | ICD-10-CM | POA: Diagnosis not present

## 2018-07-24 DIAGNOSIS — X58XXXA Exposure to other specified factors, initial encounter: Secondary | ICD-10-CM | POA: Insufficient documentation

## 2018-07-24 DIAGNOSIS — S46912A Strain of unspecified muscle, fascia and tendon at shoulder and upper arm level, left arm, initial encounter: Secondary | ICD-10-CM | POA: Diagnosis not present

## 2018-07-24 MED ORDER — METHOCARBAMOL 500 MG PO TABS: 500.0000 mg | ORAL_TABLET | Freq: Two times a day (BID) | ORAL | 0 refills | Status: DC

## 2018-07-24 NOTE — Telephone Encounter (Signed)
With history of cryptogenic stroke, essential hypertension, and hyperlipidemia in the setting of accelerating symptoms I agree with immediate assessment of her symptoms in the emergency department.

## 2018-07-24 NOTE — ED Triage Notes (Signed)
Pt reports intermittent left shoulder pain that started a week and a half ago. Reports pain upon movement. Denies any injuries.

## 2018-07-24 NOTE — ED Provider Notes (Signed)
[facility] EMERGENCY DEPARTMENT Provider Note   CSN: [id number] Arrival date & time: 07/24/18  1023    History   Chief Complaint Chief Complaint  Patient presents with  . Shoulder Pain    HPI Amber [name] is a 48 y.o. female.     HPI   48 year old female presents today with complaints of left shoulder and neck pain.  Patient notes proximally 1 week ago she developed pain in her left trapezius and shoulder.  She notes this is worse with palpation, worse with movement of the shoulder.  She denies any neurological deficits, denies any chest pain or shortness of breath.  No history of the same.  No trauma, no heavy lifting.  No medications prior to arrival.  Past Medical History:  Diagnosis Date  . Asthma   . Depression    controlled  . Hyperlipidemia    controlled with medication  . IBS (irritable bowel syndrome)   . Stroke [facility]) 03-13-2015    Patient Active Problem List   Diagnosis Date Noted  . Word finding difficulty 02/27/2018  . Restless leg syndrome 12/29/2016  . GERD (gastroesophageal reflux disease) 08/24/2016  . Hemorrhoids 08/24/2016  . Healthcare maintenance 08/24/2016  . GAD (generalized anxiety disorder) 06/22/2016  . Levoscoliosis 10/06/2015  . Prediabetes 09/08/2015  . Cryptogenic stroke (HCC) 03/14/2015  . IBS (irritable bowel syndrome) 09/29/2014  . Hyperlipidemia 09/29/2014  . Essential hypertension 09/29/2014    Past Surgical History:  Procedure Laterality Date  . ABDOMINAL HYSTERECTOMY  10/2006  . bowel reconstruction  10/2006   with hysterectomy  . CESAREAN SECTION  2005  . EP IMPLANTABLE DEVICE N/A 03/17/2015   Procedure: Loop Recorder Insertion;  Surgeon: Will Jorja Loa, MD;  Location: [facility] INVASIVE CV LAB;  Service: Cardiovascular;  Laterality: N/A;  . TEE WITHOUT CARDIOVERSION N/A 03/17/2015   Procedure: TRANSESOPHAGEAL ECHOCARDIOGRAM (TEE);  Surgeon: Chilton Si, MD;  Location: [facility] ENDOSCOPY;  Service:  Cardiovascular;  Laterality: N/A;  . TOE SURGERY     Left 2nd metatarsal     OB History    Gravida  1   Para  1   Term  0   Preterm  1   AB  0   Living  1     SAB  0   TAB  0   Ectopic  0   Multiple  0   Live Births               Home Medications    Prior to Admission medications   Medication Sig Start Date End Date Taking? Authorizing Provider  linaclotide (LINZESS) 72 MCG capsule Take 1 capsule (72 mcg total) by mouth daily before breakfast. 08/01/17   Pyrtle, Carie Caddy, MD  methocarbamol (ROBAXIN) 500 MG tablet Take 1 tablet (500 mg total) by mouth 2 (two) times daily. 07/24/18   Hakiem Malizia, Tinnie Gens, PA-C  pramipexole (MIRAPEX) 0.125 MG tablet Take 1 tablet (0.125 mg total) by mouth at bedtime. 11/01/17   Scherrie Gerlach, MD  ranitidine (ZANTAC) 150 MG tablet Take 1 tablet by mouth twice daily before meals as needed 08/01/17   Pyrtle, Carie Caddy, MD  rosuvastatin (CRESTOR) 20 MG tablet TAKE 1 TABLET BY MOUTH ONCE DAILY 01/25/18   Eulah Pont, MD    Family History Family History  Problem Relation Age of Onset  . Arthritis Father   . Diabetes Father   . Hypertension Father   . Hyperlipidemia Father   . Prostate cancer Father   . Hyperlipidemia  Mother   . Stroke Paternal Grandmother   . Heart attack Maternal Grandfather   . Hyperlipidemia Maternal Grandfather   . Prostate cancer Maternal Grandfather   . Heart attack Paternal Grandfather   . Prostate cancer Maternal Uncle        x 3  . Colon cancer Neg Hx   . Colon polyps Neg Hx   . Stomach cancer Neg Hx   . Esophageal cancer Neg Hx   . Pancreatic cancer Neg Hx     Social History Social History   Tobacco Use  . Smoking status: Never Smoker  . Smokeless tobacco: Never Used  Substance Use Topics  . Alcohol use: Yes    Alcohol/week: 7.0 standard drinks    Types: 7 Glasses of wine per week  . Drug use: Yes    Types: Marijuana    Comment: 2x/month     Allergies   Keflex [cephalexin]   Review of  Systems Review of Systems  All other systems reviewed and are negative.    Physical Exam Updated Vital Signs BP (!) 153/108 (BP Location: Right Arm)   Pulse 95   Temp 97.9 F (36.6 C) (Oral)   Resp 20   Ht 5\' 2"  (1.575 m)   Wt 79.4 kg   SpO2 98%   BMI 32.01 kg/m   Physical Exam Vitals signs and nursing note reviewed.  Constitutional:      Appearance: She is well-developed.  HENT:     Head: Normocephalic and atraumatic.  Eyes:     General: No scleral icterus.       Right eye: No discharge.        Left eye: No discharge.     Conjunctiva/sclera: Conjunctivae normal.     Pupils: Pupils are equal, round, and reactive to light.  Neck:     Musculoskeletal: Normal range of motion.     Vascular: No JVD.     Trachea: No tracheal deviation.  Cardiovascular:     Rate and Rhythm: Normal rate and regular rhythm.     Heart sounds: No murmur. No friction rub. No gallop.   Pulmonary:     Effort: Pulmonary effort is normal.     Breath sounds: No stridor. No wheezing or rales.  Musculoskeletal:     Comments: Tenderness along the left trapezius, pain with range of motion the shoulder in all directions, radial pulse 2+ grip strength 5 5 sensation intact  Neurological:     Mental Status: She is alert and oriented to person, place, and time.     Coordination: Coordination normal.  Psychiatric:        Behavior: Behavior normal.        Thought Content: Thought content normal.        Judgment: Judgment normal.      ED Treatments / Results  Labs (all labs ordered are listed, but only abnormal results are displayed) Labs Reviewed - No data to display  EKG None  Radiology No results found.  Procedures Procedures (including critical care time)  Medications Ordered in ED Medications - No data to display   Initial Impression / Assessment and Plan / ED Course  I have reviewed the triage vital signs and the nursing notes.  Pertinent labs & imaging results that were available  during my care of the patient were reviewed by me and considered in my medical decision making (see chart for details).        48 year old female presents today with muscular strain.  Patient has tenderness along the trapezius, she has no chest pain or shortness of breath, low suspicion for ACS, anterior thoracic abnormality.  Discharged home with muscle relaxers, instructions use Tylenol ibuprofen return immediately if any new or worsening signs or symptoms present.  She verbalized understanding and agreement to today's plan.  Final Clinical Impressions(s) / ED Diagnoses   Final diagnoses:  Muscle strain    ED Discharge Orders         Ordered    methocarbamol (ROBAXIN) 500 MG tablet  2 times daily     07/24/18 1106           Eyvonne Mechanic, Cordelia Poche 07/24/18 1109    Benjiman Core, MD 07/24/18 [phone]

## 2018-07-24 NOTE — Telephone Encounter (Signed)
Received TC from pt.  States she has a hx of a stroke and HTN.  Pt c/o tightness and pain in her left arm and shoulder with indigestion and increased fatigue.  States she has been experiencing these symptoms X1 week, but over last 2 days, symptoms have increased in intensity and are now constant.  Pt encouraged to be evaluated in ER and to call 911 for transport.  Pt verbalized understanding, pt declines calling 911 and states her brother will drive her to the ER. Will forward to attendings and PCP. SChaplin, RN,BSN

## 2018-07-24 NOTE — Discharge Instructions (Addendum)
Please read attached information. If you experience any new or worsening signs or symptoms please return to the emergency room for evaluation. Please follow-up with your primary care provider or specialist as discussed. Please use medication prescribed only as directed and discontinue taking if you have any concerning signs or symptoms.   °

## 2018-07-24 NOTE — ED Notes (Signed)
Pt verbalized understanding of discharge paperwork, prescriptions and follow-up care 

## 2018-08-01 ENCOUNTER — Ambulatory Visit: Payer: Medicaid Other | Admitting: Internal Medicine

## 2018-08-01 ENCOUNTER — Other Ambulatory Visit: Payer: Self-pay

## 2018-08-01 ENCOUNTER — Encounter: Payer: Self-pay | Admitting: Internal Medicine

## 2018-08-01 VITALS — BP 116/78 | HR 86 | Temp 98.2°F | Ht 62.0 in | Wt 184.3 lb

## 2018-08-01 DIAGNOSIS — M25512 Pain in left shoulder: Secondary | ICD-10-CM | POA: Diagnosis not present

## 2018-08-01 MED ORDER — DICLOFENAC SODIUM 1.6 % TD GEL: 2.0000 g | Freq: Four times a day (QID) | TRANSDERMAL | 3 refills | Status: DC

## 2018-08-01 NOTE — Patient Instructions (Signed)
Thank you for coming to the clinic today. It was a pleasure to see you.   For your shoulder pain - try using this voltaren gel and going to physical therapy. If the pain is not improving within a few weeks of starting physical therapy call the office so we can order a CT scan of your neck   FOLLOW-UP INSTRUCTIONS When: 1 year with your PCP   For: follow up of your blood pressure  What to bring: all of your medication bottles   Please call the internal medicine center clinic if you have any questions or concerns, we may be able to help and keep you from a long and expensive emergency room wait. Our clinic and after hours phone number is [phone], the best time to call is Monday through Friday 9 am to 4 pm but there is always someone available 24/7 if you have an emergency. If you need medication refills please notify your pharmacy one week in advance and they will send [place] a request.
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
## 2018-08-02 ENCOUNTER — Encounter: Payer: Self-pay | Admitting: Internal Medicine

## 2018-08-02 ENCOUNTER — Other Ambulatory Visit: Payer: Self-pay | Admitting: *Deleted

## 2018-08-02 DIAGNOSIS — M25512 Pain in left shoulder: Secondary | ICD-10-CM | POA: Insufficient documentation

## 2018-08-02 HISTORY — DX: Pain in left shoulder: M25.512

## 2018-08-02 NOTE — Telephone Encounter (Signed)
Fax from [facility] requesting to change Diclofenac from 1.6 % to 1 %. Thanks
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
## 2018-08-03 MED ORDER — DICLOFENAC SODIUM 1 % TD GEL: 2.0000 g | Freq: Four times a day (QID) | TRANSDERMAL | 3 refills | Status: DC

## 2018-08-03 NOTE — Progress Notes (Signed)
Internal Medicine Clinic Attending  Case discussed with Dr. Blum at the time of the visit.  We reviewed the resident's history and exam and pertinent patient test results.  I agree with the assessment, diagnosis, and plan of care documented in the resident's note. 

## 2018-08-08 ENCOUNTER — Ambulatory Visit (INDEPENDENT_AMBULATORY_CARE_PROVIDER_SITE_OTHER): Payer: Medicaid Other | Admitting: *Deleted

## 2018-08-08 DIAGNOSIS — I639 Cerebral infarction, unspecified: Secondary | ICD-10-CM | POA: Diagnosis not present

## 2018-08-09 LAB — CUP PACEART REMOTE DEVICE CHECK
Date Time Interrogation Session: 20200310124250
MDC IDC PG IMPLANT DT: 20161017

## 2018-08-16 ENCOUNTER — Ambulatory Visit: Payer: Medicaid Other | Attending: Internal Medicine | Admitting: Physical Therapy

## 2018-08-16 NOTE — Progress Notes (Signed)
Carelink Summary Report / Loop Recorder 

## 2018-08-16 NOTE — Addendum Note (Signed)
Addended by: Neomia Dear on: 08/16/2018 04:28 PM   Modules accepted: Orders

## 2018-08-17 ENCOUNTER — Telehealth: Payer: Self-pay | Admitting: Cardiology

## 2018-08-17 NOTE — Telephone Encounter (Signed)
Spoke w/ pt and informed her of her ILR reaching RRT. Informed her of her options and she chose to leave the ILR in place. Informed her that I would mail her a return kit so she can send the home monitor back to [employer]. Pt verbalized understanding. Pt un enrolled from [facility]. Return kit ordered. Marked inactive in [employer].

## 2018-08-22 ENCOUNTER — Telehealth: Payer: Medicaid Other | Admitting: Internal Medicine

## 2018-08-22 ENCOUNTER — Other Ambulatory Visit: Payer: Self-pay

## 2018-09-15 ENCOUNTER — Telehealth: Payer: Self-pay | Admitting: Internal Medicine

## 2018-09-15 NOTE — Telephone Encounter (Signed)
Return pt's call - c/o muscle spasms in her back/legs. Stated therapy was canceled d/t coronavirus. She has tried OTC creams and Tylenol also walking and riding the bike. Stated the spasms were so bad this morning; she was in tears.

## 2018-09-15 NOTE — Telephone Encounter (Signed)
Pt is requesting a nurse to callback [phone] regarding her muscle spasm

## 2018-09-18 ENCOUNTER — Encounter: Payer: Self-pay | Admitting: Internal Medicine

## 2018-09-18 ENCOUNTER — Ambulatory Visit (INDEPENDENT_AMBULATORY_CARE_PROVIDER_SITE_OTHER): Payer: Medicaid Other | Admitting: Internal Medicine

## 2018-09-18 ENCOUNTER — Other Ambulatory Visit: Payer: Self-pay

## 2018-09-18 DIAGNOSIS — M25512 Pain in left shoulder: Secondary | ICD-10-CM | POA: Diagnosis not present

## 2018-09-18 MED ORDER — DICLOFENAC SODIUM 1 % TD GEL: 2.0000 g | Freq: Four times a day (QID) | TRANSDERMAL | 3 refills | Status: DC

## 2018-09-18 NOTE — Telephone Encounter (Signed)
Recommend arriving her for a telehealth visit today Discussed w Dr Caron Presume

## 2018-09-18 NOTE — Telephone Encounter (Signed)
Telehealth visit completed today.

## 2018-09-18 NOTE — Telephone Encounter (Signed)
Called pt - no answer; left message to call the office about a tele health visit.
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
## 2018-09-18 NOTE — Assessment & Plan Note (Addendum)
   Muscle Spasm: Started February end-March started having bad muscle spasm in her left upper back and neck intense spasm that occurred when backing up her car when she was turning around ended up scraping cars bumper due to severity of the pain.  Had to sit in the car for 15-14minutes.  She used a heating pad and tylenol to help ease the pain.  Same side she had stroke deficits on but has not noticed any new weakness or changes in function, only limited by pain.  Resolved but now seems to occur periodically. Uses an icy hot like cream and magnesium ointments.  Soaking in a hot bath. Last cramping episode was Friday and she doesn't remember doing any particular activity that aggravated it. Never picked up voltaren gel.  Not taking crestor has been off of this for some time.  Discussed she does have refills.  No longer taking mirapex as well she is on a natural herbal regimen currently.    P: Continue hot bath, ice pack massage, represcribe voltaren gel as she has not tried this. Start physical therapy exercises available over the internet.  Return precautions given we will consider other pharmacologic therapy and possibly imaging if she fails conservative measures but seems to be isolated to muscle no radicular symptoms.  As always, pt is advised that if symptoms worsen or new symptoms arise, they should go to an urgent care facility or to to ER for further evaluation.

## 2018-09-18 NOTE — Progress Notes (Signed)
Medicine attending: Medical history, presenting problems,and medications, reviewed with resident physician Dr Chana Bode on the day of the patient telephone consultation and I concur with his evaluation and management plan. Pt has developed persistent muscle spasms in her neck which started when she rotated her head backing into a parking spot in February. No red flag neuro sxs. No relief w Robaxin prescribed in ED. Encouraged to continue trial of topical Voltaren started by Dr Obie Dredge. Stretching exercises reviewed with her by Dr Caron Presume.

## 2018-11-20 ENCOUNTER — Encounter: Payer: Self-pay | Admitting: Internal Medicine

## 2018-11-20 ENCOUNTER — Other Ambulatory Visit: Payer: Self-pay

## 2018-11-20 ENCOUNTER — Ambulatory Visit: Payer: Medicaid Other | Admitting: Internal Medicine

## 2018-11-20 VITALS — BP 134/92 | HR 85 | Temp 98.1°F | Ht 62.0 in | Wt 187.7 lb

## 2018-11-20 DIAGNOSIS — E785 Hyperlipidemia, unspecified: Secondary | ICD-10-CM | POA: Diagnosis not present

## 2018-11-20 DIAGNOSIS — Z79899 Other long term (current) drug therapy: Secondary | ICD-10-CM | POA: Diagnosis not present

## 2018-11-20 DIAGNOSIS — M4180 Other forms of scoliosis, site unspecified: Secondary | ICD-10-CM

## 2018-11-20 MED ORDER — CYCLOBENZAPRINE HCL 5 MG PO TABS: 5.0000 mg | ORAL_TABLET | Freq: Three times a day (TID) | ORAL | 0 refills | Status: DC | PRN

## 2018-11-20 NOTE — Progress Notes (Signed)
   CC: Low back pain, Hyperlipidemia  HPI:  [name] is a 48 y.o. F with PMHx listed below presenting for Low back pain, Hyperlipidemia. Please see the A&P for the status of the patient's chronic medical problems.  Past Medical History:  Diagnosis Date  . Asthma   . Depression    controlled  . Hyperlipidemia    controlled with medication  . IBS (irritable bowel syndrome)   . Stroke [facility]) 03-13-2015   Review of Systems:  Performed and all others negative.  Physical Exam:  Vitals:   11/20/18 1531  BP: (!) 134/92  Pulse: 85  Temp: 98.1 F (36.7 C)  TempSrc: Oral  SpO2: 97%  Weight: 187 lb 11.2 oz (85.1 kg)  Height: 5\' 2"  (1.575 m)   Physical Exam Constitutional:      General: She is not in acute distress.    Appearance: Normal appearance.  Cardiovascular:     Rate and Rhythm: Normal rate and regular rhythm.     Pulses: Normal pulses.     Heart sounds: Normal heart sounds.  Pulmonary:     Effort: Pulmonary effort is normal. No respiratory distress.     Breath sounds: Normal breath sounds.  Abdominal:     General: Bowel sounds are normal. There is no distension.     Palpations: Abdomen is soft.     Tenderness: There is no abdominal tenderness.  Musculoskeletal:        General: Tenderness present. No swelling or deformity.     Comments: Tenderness to palpation of R lumbar paraspinal musculature, are also noted to have muscle tightness relative to L side.  Skin:    General: Skin is warm and dry.  Neurological:     General: No focal deficit present.     Mental Status: Mental status is at baseline.    Assessment & Plan:   See Encounters Tab for problem based charting.  Patient discussed with Dr. Angelia Mould

## 2018-11-20 NOTE — Patient Instructions (Addendum)
Thank you for allowing [place] to care for you  For your low back pain - Trial of Flexril as needed for muscle spasms - We will place a referral to physical therapy   Please restart your daily Rosuvastatin

## 2018-11-20 NOTE — Assessment & Plan Note (Signed)
Patient reports 1 week of right low back pain and tightness. Pain is a 4-8/10 achy, tight low back pain with intermittent radiation into her thigh. She states it is similar to muscle spasm pain she has had in the past related to her known levoscoliosis. She has tried heat and ibuprofen 800mg  with mild relief. On exam R para spinal musculature is tight and tender to palpation. Will treat with muscle relaxants as needed and refer to PT for strengthening to help prevent further episodes. - Flexeril 5mg  q8h PRN - Continue PRN Ibuprofen - Referral to physical therapy

## 2018-11-20 NOTE — Assessment & Plan Note (Signed)
Patient state she has not taken her Statin for at least 6 months due to hearing about a recall. She was not contacted her pharmacy and has not looked into if her medication was included. She states she will check with her pharmacy and begin the medication again if confirmed safe. She is to call If a new prescription is needed. - Resume Rosuvastatin 20mg  Daily

## 2018-11-22 ENCOUNTER — Encounter: Payer: Self-pay | Admitting: Internal Medicine

## 2018-11-22 ENCOUNTER — Ambulatory Visit: Payer: Medicaid Other | Admitting: Internal Medicine

## 2018-11-22 ENCOUNTER — Other Ambulatory Visit: Payer: Self-pay

## 2018-11-22 VITALS — BP 122/97 | HR 104 | Temp 98.3°F | Ht 62.0 in | Wt 186.4 lb

## 2018-11-22 DIAGNOSIS — R1031 Right lower quadrant pain: Secondary | ICD-10-CM

## 2018-11-22 DIAGNOSIS — Z79899 Other long term (current) drug therapy: Secondary | ICD-10-CM

## 2018-11-22 DIAGNOSIS — R109 Unspecified abdominal pain: Secondary | ICD-10-CM | POA: Insufficient documentation

## 2018-11-22 DIAGNOSIS — M4180 Other forms of scoliosis, site unspecified: Secondary | ICD-10-CM | POA: Diagnosis not present

## 2018-11-22 LAB — POCT URINALYSIS DIPSTICK
Bilirubin, UA: NEGATIVE
Glucose, UA: NEGATIVE
Ketones, UA: NEGATIVE
Leukocytes, UA: NEGATIVE
Nitrite, UA: NEGATIVE
Protein, UA: NEGATIVE
Spec Grav, UA: 1.02 (ref 1.010–1.025)
Urobilinogen, UA: 0.2 E.U./dL
pH, UA: 5.5 (ref 5.0–8.0)

## 2018-11-22 MED ORDER — CYCLOBENZAPRINE HCL 7.5 MG PO TABS: 7.5000 mg | ORAL_TABLET | Freq: Three times a day (TID) | ORAL | 0 refills | Status: DC | PRN

## 2018-11-22 MED ORDER — MELOXICAM 15 MG PO TABS: 15.0000 mg | ORAL_TABLET | Freq: Every day | ORAL | 0 refills | Status: DC

## 2018-11-22 NOTE — Assessment & Plan Note (Signed)
Her exam remains consistent with muscle pain, but she clarifies that her pain radiates into her flank and even her abodomen as well. Tenderness over R low back, Right flank and RLQ. Tenderness along this region is superficial and not deep per patient. Negative murphy and negative for rebound tenderness. She does report increased urinary frequency but no dysuria. Will check urine today to evaluate for infection or signs of renal calculi. - Urine Dip with Trace-Blood - U/A with Microscopy - Increase flexeril to 7.5mg  TID PRN - Mobic 15mg  Daily

## 2018-11-22 NOTE — Assessment & Plan Note (Addendum)
Patient has persistent low back pain radiating to her right flank. Her pain has persisted for the past 2 days after starting flexeril and remaining on Ibuprofen. This may be due to under-treatment / minimal time since evaluation vs alternative etiology. Her exam remains consistent with muscle pain, but she clarifies that her pain radiates into her flank and even her abodomen as well. Tenderness over R low back, Right flank and RLQ. Tenderness along this region is superficial and not deep per patient. Negative murphy and negative for rebound tenderness. She does report increased urinary frequency but no dysuria. Will check urine today to evaluate for infection or signs of renal calculi. - Urine Dip with Trace-Blood - U/A with Microscopy - Increase flexeril to 7.5mg  TID PRN - Mobic 15mg  Daily

## 2018-11-22 NOTE — Progress Notes (Signed)
   CC: Low back pain, Flank pain  HPI:  [name] is a 48 y.o. F with PMHx listed below presenting for Low back pain, flank pain. Please see the A&P for the status of the patient's chronic medical problems.  Past Medical History:  Diagnosis Date  . Asthma   . Depression    controlled  . Hyperlipidemia    controlled with medication  . IBS (irritable bowel syndrome)   . Stroke [facility]) 03-13-2015   Review of Systems:  Performed and all others negative.  Physical Exam:  Vitals:   11/22/18 1408 11/22/18 1411  BP:  (!) 122/97  Pulse: (!) 104   Temp:  98.3 F (36.8 C)  TempSrc:  Oral  SpO2: 100%   Weight: 186 lb 6.4 oz (84.6 kg)   Height: 5\' 2"  (1.575 m)    Physical Exam Constitutional:      General: She is not in acute distress.    Appearance: Normal appearance.  Cardiovascular:     Rate and Rhythm: Normal rate and regular rhythm.     Pulses: Normal pulses.     Heart sounds: Normal heart sounds.  Pulmonary:     Effort: Pulmonary effort is normal. No respiratory distress.     Breath sounds: Normal breath sounds.  Abdominal:     General: Bowel sounds are normal. There is no distension.     Palpations: Abdomen is soft.     Comments: Negative Murphy's sign, Negative lloyd's sign, Negative for rebound tenderness. Tenderness of abdominal wall on right in the distrbution of the right oblique  Musculoskeletal:        General: Tenderness present. No swelling or deformity.     Comments: Tenderness to palpation of R lumbar paraspinal musculature with muscle tightness relative to L side. Tenderness of abdominal wall on right in the distrbution of the right oblique  Skin:    General: Skin is warm and dry.  Neurological:     General: No focal deficit present.     Mental Status: Mental status is at baseline.    Assessment & Plan:   See Encounters Tab for problem based charting.  Patient discussed with Dr. Angelia Mould

## 2018-11-22 NOTE — Progress Notes (Signed)
Internal Medicine Clinic Attending  Case discussed with Dr. Melvin  at the time of the visit.  We reviewed the resident's history and exam and pertinent patient test results.  I agree with the assessment, diagnosis, and plan of care documented in the resident's note.  

## 2018-11-22 NOTE — Patient Instructions (Addendum)
Thank you for allowing [place] to care for you  For your low back pain - Increase Flexeril dose to 7.5 (1 and 1/2 of the 5mg  tabs) - Start Mobic 15mg  Daily, try not to take additional ibuprofen with this - Checking your urine more closely, due to some blood seen on urine dipstick in office - You will be contacted with the results

## 2018-11-23 LAB — URINALYSIS, COMPLETE
Bilirubin, UA: NEGATIVE
Glucose, UA: NEGATIVE
Ketones, UA: NEGATIVE
Leukocytes,UA: NEGATIVE
Nitrite, UA: NEGATIVE
Protein,UA: NEGATIVE
RBC, UA: NEGATIVE
Specific Gravity, UA: 1.019 (ref 1.005–1.030)
Urobilinogen, Ur: 0.2 mg/dL (ref 0.2–1.0)
pH, UA: 5 (ref 5.0–7.5)

## 2018-11-23 LAB — MICROSCOPIC EXAMINATION
Bacteria, UA: NONE SEEN
Casts: NONE SEEN /lpf

## 2018-11-24 ENCOUNTER — Telehealth: Payer: Self-pay

## 2018-11-24 NOTE — Telephone Encounter (Signed)
Requesting lab results from Wednesday 6/24/2020visit. Please call pt back.

## 2018-11-24 NOTE — Telephone Encounter (Signed)
Attempted to contact patient by phone to discuss her normal U/A result, but there was no answer.   The urinalysis being normal makes the possibility of UTI or Stone unlikely and that he pain is most likely musculoskeletal in nature.    Less likely, but if she develops a rash in the region where she is feeling pain, there is a possibility that this could be shingles.

## 2018-11-24 NOTE — Progress Notes (Signed)
Internal Medicine Clinic Attending  Case discussed with Dr. Melvin  at the time of the visit.  We reviewed the resident's history and exam and pertinent patient test results.  I agree with the assessment, diagnosis, and plan of care documented in the resident's note.  

## 2018-11-24 NOTE — Telephone Encounter (Signed)
rtc to pt, gave her dr Tally Joe message, she will call back about 2 days before running out of meds and will speak about progress

## 2018-11-24 NOTE — Telephone Encounter (Signed)
Pt calling back to speak with a nurse about getting lab results. Please call pt back.

## 2018-12-11 ENCOUNTER — Ambulatory Visit: Payer: Medicaid Other | Admitting: Physical Therapy

## 2018-12-19 ENCOUNTER — Ambulatory Visit: Payer: Medicaid Other

## 2018-12-21 ENCOUNTER — Ambulatory Visit: Payer: Medicaid Other | Attending: Internal Medicine

## 2018-12-21 DIAGNOSIS — M545 Low back pain: Secondary | ICD-10-CM | POA: Insufficient documentation

## 2018-12-21 DIAGNOSIS — M62830 Muscle spasm of back: Secondary | ICD-10-CM | POA: Insufficient documentation

## 2018-12-21 DIAGNOSIS — R293 Abnormal posture: Secondary | ICD-10-CM | POA: Insufficient documentation

## 2018-12-21 DIAGNOSIS — M6281 Muscle weakness (generalized): Secondary | ICD-10-CM | POA: Insufficient documentation

## 2018-12-21 DIAGNOSIS — G8929 Other chronic pain: Secondary | ICD-10-CM | POA: Insufficient documentation

## 2018-12-25 ENCOUNTER — Encounter: Payer: Self-pay | Admitting: Physical Therapy

## 2018-12-25 ENCOUNTER — Ambulatory Visit: Payer: Medicaid Other | Admitting: Physical Therapy

## 2018-12-25 ENCOUNTER — Other Ambulatory Visit: Payer: Self-pay

## 2018-12-25 DIAGNOSIS — M6281 Muscle weakness (generalized): Secondary | ICD-10-CM

## 2018-12-25 DIAGNOSIS — M62830 Muscle spasm of back: Secondary | ICD-10-CM | POA: Diagnosis not present

## 2018-12-25 DIAGNOSIS — G8929 Other chronic pain: Secondary | ICD-10-CM

## 2018-12-25 DIAGNOSIS — R293 Abnormal posture: Secondary | ICD-10-CM | POA: Diagnosis not present

## 2018-12-25 DIAGNOSIS — M545 Low back pain, unspecified: Secondary | ICD-10-CM

## 2018-12-25 NOTE — Therapy (Signed)
[place], [place], [address] Phone: [phone]   Fax:  [phone]  Physical Therapy Evaluation  Patient Details  Name: Amber [name] MRN: [id number] Date of Birth: [birth year] Referring Provider (PT): Bartholomew Crews,   Encounter Date: 12/25/2018  PT End of Session - 12/25/18 1048    Visit Number  1    Number of Visits  13    Date for PT Re-Evaluation  02/19/19    Authorization Type  MCD: Resubmit at 4th visit    PT Start Time  1048    PT Stop Time  1128    PT Time Calculation (min)  40 min    Activity Tolerance  Patient tolerated treatment well    Behavior During Therapy  [facility] for tasks assessed/performed       Past Medical History:  Diagnosis Date  . Anxiety    self reported  . Asthma   . Depression    controlled  . Hyperlipidemia    controlled with medication  . IBS (irritable bowel syndrome)   . Stroke [facility]) 03-13-2015    Past Surgical History:  Procedure Laterality Date  . ABDOMINAL HYSTERECTOMY  10/2006  . bowel reconstruction  10/2006   with hysterectomy  . CESAREAN SECTION  2005  . EP IMPLANTABLE DEVICE N/A 03/17/2015   Procedure: Loop Recorder Insertion;  Surgeon: Will Meredith Leeds, MD;  Location: [place] [facility];  Service: Cardiovascular;  Laterality: N/A;  . TEE WITHOUT CARDIOVERSION N/A 03/17/2015   Procedure: TRANSESOPHAGEAL ECHOCARDIOGRAM (TEE);  Surgeon: Skeet Latch, MD;  Location: [facility] ENDOSCOPY;  Service: Cardiovascular;  Laterality: N/A;  . TOE SURGERY     Left 2nd metatarsal    There were no vitals filed for this visit.   Subjective Assessment - 12/25/18 1056    Subjective  pt is a 48 y.o F with CC of L shoulder/ chest pain that began in April/ March with with no specific MOI. She reported when the shoulder/ chest pain occurred she went and layed down and felt better but noticed having L flank pain that has fluctuated since onset. She does report having  pain/N/T into the back of the knee. No report of hx of incontinence.    Pertinent History  hx of L sided stroke    Limitations  Standing;Walking    How long can you sit comfortably?  2 hours    How long can you stand comfortably?  30 min    How long can you walk comfortably?  1 hour    Diagnostic tests  n/a    Patient Stated Goals  to decrease pain, to move better, increase walking endurance    Currently in Pain?  Yes    Pain Score  0-No pain   at worst 10/10   Pain Location  Flank    Pain Orientation  Left    Pain Descriptors / Indicators  Aching;Throbbing    Pain Type  Chronic pain    Pain Radiating Towards  to the back of the knees    Pain Onset  More than a month ago    Pain Frequency  Intermittent    Aggravating Factors   unsure could be anything,    Pain Relieving Factors  medication heating pad    Effect of Pain on Daily Activities  limited endurance         [facility] PT Assessment - 12/25/18 0001      Assessment   Medical Diagnosis  Levoscoliosis  Referring Provider (PT)  Burns Spain,    Onset Date/Surgical Date  --   April-March 2020   Hand Dominance  Right    Next MD Visit  make on PRN      Precautions   Precautions  None      Restrictions   Weight Bearing Restrictions  No      Balance Screen   Has the patient fallen in the past 6 months  No    Has the patient had a decrease in activity level because of a fear of falling?   No    Is the patient reluctant to leave their home because of a fear of falling?   No      Home Environment   Living Environment  Private residence    Living Arrangements  Children    Available Help at Discharge  Family    Type of Home  House    Home Access  Level entry    Home Layout  Two level    Alternate Level Stairs-Number of Steps  9    Alternate Level Stairs-Rails  Right   ascending   Home Equipment  None      Prior Function   Level of Independence  Independent    Vocation  Full time employment    Vocation  Requirements  prolonged sitting      Observation/Other Assessments   Observations  pt situated position frequently throughout session      ROM / Strength   AROM / PROM / Strength  AROM;Strength;PROM      AROM   AROM Assessment Site  Lumbar    Lumbar Flexion  92    Lumbar Extension  30    Lumbar - Right Side Bend  12    Lumbar - Left Side Bend  28      Strength   Overall Strength  Within functional limits for tasks performed    Overall Strength Comments  bil hip abductors 4/5    Strength Assessment Site  Hip;Knee      Palpation   Palpation comment  TTP along the L QL and glute med tightness with multiple triggers                Objective measurements completed on examination: See above findings.      [facility] Adult PT Treatment/Exercise - 12/25/18 0001      Exercises   Exercises  Lumbar      Lumbar Exercises: Stretches   Lower Trunk Rotation  --   2 x 10   Quadruped Mid Back Stretch  30 seconds;3 reps   1 rep walking hands to the R     Lumbar Exercises: Sidelying   Hip Abduction  10 reps;Left;Right             PT Education - 12/25/18 1125    Education Details  evaluation findings, POC, goals, HEP with proper form/ rationale    Person(s) Educated  Patient    Methods  Explanation;Verbal cues;Handout    Comprehension  Verbalized understanding;Verbal cues required       PT Short Term Goals - 12/25/18 1233      PT SHORT TERM GOAL #1   Title  pt to be I with inital HEp    Baseline  no previous HEP    Time  3    Period  Weeks    Status  New    Target Date  01/15/19      PT SHORT  TERM GOAL #2   Title  pt to verbalize/ demo proper posture and lifting mechanics to reduce and prevent back pain    Baseline  no previous knowledge of proper posture/biomechanics    Time  3    Period  Weeks    Status  New    Target Date  01/15/19        PT Long Term Goals - 12/25/18 1234      PT LONG TERM GOAL #1   Title  pt to increase trunk R sidebending to  >/= 20 degrees with no reprot of pain or stiffness for funcitonal mobility    Baseline  R sidebending 12 degrees with noted stiffness in the L QL    Time  6    Period  Weeks    Status  New    Target Date  02/05/19      PT LONG TERM GOAL #2   Title  pt to be able to stand and walk for >/= 60 min with no report of pain or stiffness for functional endurance required for work related activities    Baseline  pt able to stand 30 min with pain getting at max 10/10    Time  6    Period  Weeks    Status  New    Target Date  02/05/19      PT LONG TERM GOAL #3   Title  pt to be able to return to regular exercise routine per pt's personal goal with no limtiation    Baseline  unable to work out due to work pain / fear of pain inthe L flank    Time  6    Period  Weeks    Status  New    Target Date  02/05/19      PT LONG TERM GOAL #4   Title  pt to be I with all HEP given as of last visit to maintain and progress current level of function    Baseline  no previous HEP    Time  6    Period  Weeks    Status  New    Target Date  02/05/19             Plan - 12/25/18 1127    Clinical Impression Statement  pt present to OPPT with CC of L flank pain that started back in April - March starting in the L shoulder/ Chest that progress to the L flank. She demonstrates functional trunk ROm with mild limtation with R trunk lean. mild weakness noted in hip abductors/ flexor, unable to reproduce pt symptoms but noted TTP along the L QL and parapsinals. She would benefit from physical therapy to decrease low back pain, improve trunk mobility, increase hip strength, utilzing efficient posture/biomechaincs and return to PLOF by addressing the deficits listed.    Personal Factors and Comorbidities  Age;Comorbidity 3+    Comorbidities  Hx of stroke, Anxiety, and depression    Examination-Activity Limitations  Stand    Stability/Clinical Decision Making  Evolving/Moderate complexity    Clinical Decision  Making  Moderate    Rehab Potential  Good    PT Frequency  2x / week    PT Duration  6 weeks   inital MCD auth 1 x a week for the 3 weeks.   PT Treatment/Interventions  ADLs/Self Care Home Management;Cryotherapy;Electrical Stimulation;Iontophoresis 4mg /ml Dexamethasone;Moist Heat;Traction;Therapeutic exercise;Therapeutic activities;Balance training;Passive range of motion;Dry needling;Patient/family education;Taping;Ultrasound;Gait training;Functional mobility training    PT Next  Visit Plan  review/ update HEP, core strengthening, STW along L QL and paraspinals, core/ hip strengthening, posture education    PT Home Exercise Plan  childs pose with bias to the R, sidelying hip abduction, supine marching, lower trunk rotation    Consulted and Agree with Plan of Care  Patient       Patient will benefit from skilled therapeutic intervention in order to improve the following deficits and impairments:  Pain, Postural dysfunction, Decreased strength, Improper body mechanics, Decreased endurance, Decreased balance, Decreased activity tolerance  Visit Diagnosis: 1. Chronic left-sided low back pain, unspecified whether sciatica present   2. Muscle weakness (generalized)   3. Muscle spasm of back   4. Abnormal posture        Problem List Patient Active Problem List   Diagnosis Date Noted  . Right flank pain 11/22/2018  . Left shoulder pain 08/02/2018  . Word finding difficulty 02/27/2018  . Restless leg syndrome 12/29/2016  . GERD (gastroesophageal reflux disease) 08/24/2016  . Hemorrhoids 08/24/2016  . Healthcare maintenance 08/24/2016  . GAD (generalized anxiety disorder) 06/22/2016  . Levoscoliosis 10/06/2015  . Prediabetes 09/08/2015  . Cryptogenic stroke (HCC) 03/14/2015  . IBS (irritable bowel syndrome) 09/29/2014  . Hyperlipidemia 09/29/2014  . Essential hypertension 09/29/2014   Amber RidingKristoffer Abhiraj [name] PT, DPT, LAT, ATC  12/25/18  12:42 PM      [facility] [address] [place], [place], [id number] Phone: [phone]   Fax:  [phone]  Name: Molli BarrowsLatasha [name] MRN: [id number] Date of Birth: 05-Sep-1970

## 2019-01-02 ENCOUNTER — Ambulatory Visit: Payer: Medicaid Other | Admitting: Physical Therapy

## 2019-01-09 ENCOUNTER — Ambulatory Visit: Payer: Medicaid Other | Admitting: Physical Therapy

## 2019-01-11 ENCOUNTER — Other Ambulatory Visit: Payer: Self-pay

## 2019-01-11 ENCOUNTER — Ambulatory Visit: Payer: Medicaid Other | Attending: Internal Medicine

## 2019-01-11 DIAGNOSIS — M6281 Muscle weakness (generalized): Secondary | ICD-10-CM | POA: Insufficient documentation

## 2019-01-11 DIAGNOSIS — M62830 Muscle spasm of back: Secondary | ICD-10-CM | POA: Diagnosis not present

## 2019-01-11 DIAGNOSIS — R293 Abnormal posture: Secondary | ICD-10-CM | POA: Insufficient documentation

## 2019-01-11 DIAGNOSIS — M545 Low back pain, unspecified: Secondary | ICD-10-CM

## 2019-01-11 DIAGNOSIS — G8929 Other chronic pain: Secondary | ICD-10-CM | POA: Diagnosis not present

## 2019-01-11 NOTE — Therapy (Addendum)
[place], [place], [address] Phone: [phone]   Fax:  [phone]  Physical Therapy Treatment/Discharge  Patient Details  Name: Amber [name] MRN: [id number] Date of Birth: 12-02-1970 Referring Provider (PT): Bartholomew Crews,   Encounter Date: 01/11/2019  PT End of Session - 01/11/19 0701    Visit Number  2    Number of Visits  13    Date for PT Re-Evaluation  02/19/19    Authorization Type  MCD: Resubmit at 4th visit    Authorization Time Period  3 vistis limited to 01/21/19    Authorization - Visit Number  1    Authorization - Number of Visits  3    PT Start Time  0700    PT Stop Time  0740    PT Time Calculation (min)  40 min    Activity Tolerance  Patient tolerated treatment well    Behavior During Therapy  [facility] for tasks assessed/performed       Past Medical History:  Diagnosis Date  . Anxiety    self reported  . Asthma   . Depression    controlled  . Hyperlipidemia    controlled with medication  . IBS (irritable bowel syndrome)   . Stroke [facility]) 03-13-2015    Past Surgical History:  Procedure Laterality Date  . ABDOMINAL HYSTERECTOMY  10/2006  . bowel reconstruction  10/2006   with hysterectomy  . CESAREAN SECTION  2005  . EP IMPLANTABLE DEVICE N/A 03/17/2015   Procedure: Loop Recorder Insertion;  Surgeon: Will Meredith Leeds, MD;  Location: [place] [facility];  Service: Cardiovascular;  Laterality: N/A;  . TEE WITHOUT CARDIOVERSION N/A 03/17/2015   Procedure: TRANSESOPHAGEAL ECHOCARDIOGRAM (TEE);  Surgeon: Skeet Latch, MD;  Location: [facility] ENDOSCOPY;  Service: Cardiovascular;  Laterality: N/A;  . TOE SURGERY     Left 2nd metatarsal    There were no vitals filed for this visit.  Subjective Assessment - 01/11/19 0702    Subjective  She reports no pain this AM. No problems  with HEP . Body pillow helps with decr spasm in AM. Walks and jogs for activity. Has not locked up  lately    Currently in Pain?  No/denies                       [facility] Adult PT Treatment/Exercise - 01/11/19 0001      Lumbar Exercises: Stretches   Lower Trunk Rotation  --   10 reps  RT/LT   Quadruped Mid Back Stretch  30 seconds;1 rep   hen 1 30 sec to RT   Other Lumbar Stretch Exercise  seated side bending x6 reps    Other Lumbar Stretch Exercise  at wall back to wall  worked on flattening spine to wall with cues for glut squeeze and  ab draw.    ben over and x 4 deep beaths  with exhalation x4 3 sets        Lumbar Exercises: Aerobic   Nustep  L3 5 min LE and UE      Lumbar Exercises: Supine   Other Supine Lumbar Exercises  legs on ball 90/90 with butt lift  and inhale /exhale 2 sets of 4      Lumbar Exercises: Sidelying   Hip Abduction  10 reps;Left;Right               PT Short Term Goals - 12/25/18 1233  PT SHORT TERM GOAL #1   Title  pt to be I with inital HEp    Baseline  no previous HEP    Time  3    Period  Weeks    Status  New    Target Date  01/15/19      PT SHORT TERM GOAL #2   Title  pt to verbalize/ demo proper posture and lifting mechanics to reduce and prevent back pain    Baseline  no previous knowledge of proper posture/biomechanics    Time  3    Period  Weeks    Status  New    Target Date  01/15/19        PT Long Term Goals - 12/25/18 1234      PT LONG TERM GOAL #1   Title  pt to increase trunk R sidebending to >/= 20 degrees with no reprot of pain or stiffness for funcitonal mobility    Baseline  R sidebending 12 degrees with noted stiffness in the L QL    Time  6    Period  Weeks    Status  New    Target Date  02/05/19      PT LONG TERM GOAL #2   Title  pt to be able to stand and walk for >/= 60 min with no report of pain or stiffness for functional endurance required for work related activities    Baseline  pt able to stand 30 min with pain getting at max 10/10    Time  6    Period  Weeks    Status  New     Target Date  02/05/19      PT LONG TERM GOAL #3   Title  pt to be able to return to regular exercise routine per pt's personal goal with no limtiation    Baseline  unable to work out due to work pain / fear of pain inthe L flank    Time  6    Period  Weeks    Status  New    Target Date  02/05/19      PT LONG TERM GOAL #4   Title  pt to be I with all HEP given as of last visit to maintain and progress current level of function    Baseline  no previous HEP    Time  6    Period  Weeks    Status  New    Target Date  02/05/19            Plan - 01/11/19 0702    Clinical Impression Statement  No pain post . Difficuty with doing PPT  so worked on ab and  glut activation  and worked on flexion with deep breathing.  Needed cuing to do her HEP.  Adke her to use handout for  HEP.    PT Treatment/Interventions  ADLs/Self Care Home Management;Cryotherapy;Electrical Stimulation;Iontophoresis 15m/ml Dexamethasone;Moist Heat;Traction;Therapeutic exercise;Therapeutic activities;Balance training;Passive range of motion;Dry needling;Patient/family education;Taping;Ultrasound;Gait training;Functional mobility training    PT Next Visit Plan  review/ update HEP, core strengthening, STW along L QL and paraspinals, core/ hip strengthening, posture education    PT Home Exercise Plan  childs pose with bias to the R, sidelying hip abduction, supine marching, lower trunk rotation    Consulted and Agree with Plan of Care  Patient       Patient will benefit from skilled therapeutic intervention in order to improve the following deficits and impairments:  Pain, Postural dysfunction, Decreased strength, Improper body mechanics, Decreased endurance, Decreased balance, Decreased activity tolerance  Visit Diagnosis: 1. Chronic left-sided low back pain, unspecified whether sciatica present   2. Muscle weakness (generalized)   3. Muscle spasm of back   4. Abnormal posture        Problem List Patient Active  Problem List   Diagnosis Date Noted  . Right flank pain 11/22/2018  . Left shoulder pain 08/02/2018  . Word finding difficulty 02/27/2018  . Restless leg syndrome 12/29/2016  . GERD (gastroesophageal reflux disease) 08/24/2016  . Hemorrhoids 08/24/2016  . Healthcare maintenance 08/24/2016  . GAD (generalized anxiety disorder) 06/22/2016  . Levoscoliosis 10/06/2015  . Prediabetes 09/08/2015  . Cryptogenic stroke ([place]) 03/14/2015  . IBS (irritable bowel syndrome) 09/29/2014  . Hyperlipidemia 09/29/2014  . Essential hypertension 09/29/2014    Darrel Hoover  PT  01/11/2019, 7:43 AM  [facility] [address] [place], [place], [address] Phone: [phone]   Fax:  [phone]  Name: Amber [name] MRN: [id number] Date of Birth: 11-Oct-1970 PHYSICAL THERAPY DISCHARGE SUMMARY  Visits from Start of Care: 2  Current functional level related to goals / functional outcomes: Unknown. She canceled her last 2 appointments due to father being in hospital  Remaining deficits: Unknown   Education / Equipment: HEP  Plan:                                                    Patient goals were not met. Patient is being discharged due to not returning since the last visit.  ?????  Pearson Forster  PT 03/12/19

## 2019-01-15 ENCOUNTER — Ambulatory Visit: Payer: Medicaid Other | Admitting: Physical Therapy

## 2019-01-16 ENCOUNTER — Ambulatory Visit: Payer: Medicaid Other | Admitting: Physical Therapy

## 2019-03-06 ENCOUNTER — Other Ambulatory Visit: Payer: Self-pay

## 2019-03-06 ENCOUNTER — Encounter: Payer: Self-pay | Admitting: Radiation Oncology

## 2019-03-06 ENCOUNTER — Ambulatory Visit: Payer: Medicaid Other | Admitting: Radiation Oncology

## 2019-03-06 VITALS — BP 135/97 | HR 90 | Ht 62.0 in | Wt 187.9 lb

## 2019-03-06 DIAGNOSIS — Z79899 Other long term (current) drug therapy: Secondary | ICD-10-CM | POA: Diagnosis not present

## 2019-03-06 DIAGNOSIS — E669 Obesity, unspecified: Secondary | ICD-10-CM | POA: Diagnosis not present

## 2019-03-06 DIAGNOSIS — Z8673 Personal history of transient ischemic attack (TIA), and cerebral infarction without residual deficits: Secondary | ICD-10-CM

## 2019-03-06 DIAGNOSIS — R7303 Prediabetes: Secondary | ICD-10-CM | POA: Diagnosis not present

## 2019-03-06 DIAGNOSIS — I10 Essential (primary) hypertension: Secondary | ICD-10-CM | POA: Diagnosis not present

## 2019-03-06 DIAGNOSIS — Z6834 Body mass index (BMI) 34.0-34.9, adult: Secondary | ICD-10-CM

## 2019-03-06 DIAGNOSIS — E785 Hyperlipidemia, unspecified: Secondary | ICD-10-CM

## 2019-03-06 DIAGNOSIS — R5383 Other fatigue: Secondary | ICD-10-CM | POA: Diagnosis not present

## 2019-03-06 DIAGNOSIS — K5900 Constipation, unspecified: Secondary | ICD-10-CM | POA: Diagnosis not present

## 2019-03-06 DIAGNOSIS — Z23 Encounter for immunization: Secondary | ICD-10-CM | POA: Diagnosis not present

## 2019-03-06 DIAGNOSIS — Z0000 Encounter for general adult medical examination without abnormal findings: Secondary | ICD-10-CM

## 2019-03-06 LAB — POCT GLYCOSYLATED HEMOGLOBIN (HGB A1C): Hemoglobin A1C: 6.2 % — AB (ref 4.0–5.6)

## 2019-03-06 LAB — GLUCOSE, CAPILLARY: Glucose-Capillary: 85 mg/dL (ref 70–99)

## 2019-03-06 MED ORDER — LOSARTAN POTASSIUM 25 MG PO TABS: 25.0000 mg | ORAL_TABLET | Freq: Every day | ORAL | 11 refills | Status: DC

## 2019-03-06 NOTE — Assessment & Plan Note (Signed)
-  pt w/ hx of hyperlipidemia and cryptogenic stroke on rosuvostatin 40 mg daily w/ decent control of cholesterol on last lipid panel 2 years prior -06/11/2016 lipid panel results: total cholesterol 160 and LDL 76 (goal <70 in stroke patients) -plan to recheck lipids while fasting at 4 week follow up  -continue rosuvastatin -follow up with medical weight management

## 2019-03-06 NOTE — Assessment & Plan Note (Signed)
-  pt reports a 20-30 lb weight gain in setting of increased fatigue and mental fogginess; patient denies depressive sx or excessive anxiety -checking TSH -referral to medical weight management given other comorbidities of hypertension, hyperlipidemia and prediabetes

## 2019-03-06 NOTE — Progress Notes (Signed)
   CC: fatigue and weight gain  HPI:  [name] is a 48 y.o. F here for follow up of her chronic medical conditions.   Past Medical History:  Diagnosis Date  . Anxiety    self reported  . Asthma   . Depression    controlled  . Hyperlipidemia    controlled with medication  . IBS (irritable bowel syndrome)   . Stroke [facility]) 03-13-2015   Review of Systems:    Review of Systems  Constitutional: Negative for chills, fever and weight loss (20-30 lb weight gain).  HENT: Negative for congestion and sore throat.   Respiratory: Negative for cough and shortness of breath.   Cardiovascular: Negative for chest pain and leg swelling.  Gastrointestinal: Positive for constipation (2-4 BM/day).  Genitourinary: Negative for dysuria.  Musculoskeletal: Negative for falls.  Skin: Negative for rash.  Neurological: Negative for loss of consciousness.       Mental fogginess  Psychiatric/Behavioral: Negative for depression. The patient is not nervous/anxious.   All other systems reviewed and are negative.    Physical Exam:  Vitals:   03/06/19 1529 03/06/19 1620  BP: (!) 132/102 (!) 135/97  Pulse: 80 90  SpO2: 99% 99%  Weight: 187 lb 14.4 oz (85.2 kg)   Height: 5\' 2"  (1.575 m)     Physical Exam  Constitutional: She is oriented to person, place, and time and well-developed, well-nourished, and in no distress. No distress.  HENT:  Head: Normocephalic.  Neck: Normal range of motion.  Cardiovascular: Normal rate, regular rhythm and intact distal pulses.  No murmur heard. Pulmonary/Chest: Effort normal and breath sounds normal. No respiratory distress.  Abdominal: Soft. Bowel sounds are normal. She exhibits no distension. There is no abdominal tenderness.  Musculoskeletal: Normal range of motion.        General: No tenderness, deformity or edema.  Neurological: She is alert and oriented to person, place, and time.  Skin: Skin is warm and dry. She is not diaphoretic. No erythema.    Assessment & Plan:   See Encounters Tab for problem based charting.  Patient seen with Dr. Dareen Piano

## 2019-03-06 NOTE — Assessment & Plan Note (Addendum)
-  pt offered flu vaccine and declined; asked patient about her reservations and she declined to discuss at this time  -pt received TDAP today -pt reports being up to date on mammogram and pap smear which are not in our system so patient filled out a release form to obtain records from her Harle Battiest in [place] so our records can be updated

## 2019-03-06 NOTE — Patient Instructions (Addendum)
Thank you for coming to your appointment. It was so nice to see you. Today we discussed  Healthcare maintenance       -     You received your TDAP vaccine       -     You are up to date with your mammogram and pap smear at your [facility] in [place]; we will request those records so that we can keep you up to date in our system   Essential hypertension       -     your BP was elevated to 132/102 and decreased slightly to 135/97       -     we discussed exercising for weight loss and blood pressure control        -     given your history of a stroke we want to keep very tight control of your blood pressure so we will be starting losartan 25 mg daily with plan to recheck with labs in 4 weeks   Weight gain/fatigue -     We will check your thyroid which can cause weight gain and fatigue when low -      Referral to medical weight management  Hyperlipidemia, unspecified hyperlipidemia type       -     continue rosuvastatin       -     Fasting lipid panel at 4 week follow up  Prediabetes       -     we will check your A1c today with plan to start an oral medication if you are in the diabetic range  If labs were drawn today, I will call you with any abnormal results. Otherwise, please keep up the good work. I look forward to seeing you again soon at your follow up in 3 months.  Sincerely,  Al Decant, MD

## 2019-03-06 NOTE — Assessment & Plan Note (Addendum)
Today's Vitals   03/06/19 1529 03/06/19 1620  BP: (!) 132/102 (!) 135/97  Pulse: 80 90  SpO2: 99% 99%  Weight: 187 lb 14.4 oz (85.2 kg)   Height: 5\' 2"  (1.575 m)    Body mass index is 34.37 kg/m. -pt with prior hx of hypertension not currently on medication with stable BPs until a few months ago; pt reports a 20-30 lb weight gain during this time period  -BP today was 132/102, down to 135/97 on recheck -we discussed exercising for weight loss and blood pressure control which she is interested in pursuing -we have set up referral to weight management for assisstance -given hx of stroke and prediabetes, decided to start treatment with losartan 25 mg daily w/ follow up in 4 weeks for recheck with BMP

## 2019-03-06 NOTE — Assessment & Plan Note (Signed)
Prediabetes -Hgb A1c in prediabetic range 1.5 years ago  -pt w/ recent 20-30 lb weight gain -will recheck A1c w/ plan to start oral medication if in diabetic range; otherwise, recheck in one year -follow up with medical weight management for diet and exercise assistance

## 2019-03-07 ENCOUNTER — Telehealth: Payer: Self-pay | Admitting: Dietician

## 2019-03-07 LAB — TSH: TSH: 2.47 u[IU]/mL (ref 0.450–4.500)

## 2019-03-07 NOTE — Telephone Encounter (Signed)
Asked to call patient  because [facility] weight management office is not able to get patients in for at least 3 months. I called [name] and offered to answer questions and discuss weight management strategies. She wanted to set up a time on 03-28-19 to follow up on her goals she set this morning with Dr. Madilyn Fireman to begin moving more and eat less fat.

## 2019-03-07 NOTE — Telephone Encounter (Signed)
I am out of town that week. Was she planning to meet with you or the both of [place]?

## 2019-03-07 NOTE — Progress Notes (Signed)
Internal Medicine Clinic Attending  I saw and evaluated the patient.  I personally confirmed the key portions of the history and exam documented by Dr. Lanier and I reviewed pertinent patient test results.  The assessment, diagnosis, and plan were formulated together and I agree with the documentation in the resident's note.   

## 2019-03-07 NOTE — Addendum Note (Signed)
Addended by: Velora Heckler on: 03/07/2019 08:45 AM   Modules accepted: Miquel Dunn

## 2019-03-07 NOTE — Telephone Encounter (Signed)
Sorry for the miscommunication. She and I set up a phone meeting to follow up on her goals that she set with you today. You do not need to be there. I will keep you up to date.

## 2019-03-08 NOTE — Telephone Encounter (Signed)
No worries. Thank you for your help!

## 2019-03-19 ENCOUNTER — Telehealth: Payer: Self-pay | Admitting: Radiation Oncology

## 2019-03-19 NOTE — Telephone Encounter (Signed)
Return pt's call - stated she was started on a new high blood pressure pill, Losartan and cannot tolerate it - it makes her drowsy and tired "It's too strong for me". Stated she was on something else years ago and did not have any problems; she does not remember the name. Thanks

## 2019-03-19 NOTE — Telephone Encounter (Signed)
Pt is having problems with medications; pls contact [phone]

## 2019-03-20 ENCOUNTER — Telehealth: Payer: Self-pay | Admitting: Radiation Oncology

## 2019-03-20 MED ORDER — HYDROCHLOROTHIAZIDE 12.5 MG PO CAPS: 12.5000 mg | ORAL_CAPSULE | Freq: Every day | ORAL | 11 refills | Status: DC

## 2019-03-20 NOTE — Telephone Encounter (Signed)
Spoke to patient and switched from Losartan to HCTZ. Documented as a phone note. Please let me know if that was appropriate.

## 2019-03-20 NOTE — Telephone Encounter (Signed)
Call from pt again - stated not feel well, feeling tired after taking Losartan. Stated she has been taking it at night. Bp today was 132/103. I talked to Dr Madilyn Fireman - state she will call pt. [phone]

## 2019-03-20 NOTE — Telephone Encounter (Signed)
Patient did not tolerate. States she was on a BP medication prior that she tolerated. Chart shows HCTZ. Patient states that that was the one. Will dc Losartan and start HCTZ 12.5. Patient follows up in clinic on 11/10.

## 2019-03-20 NOTE — Telephone Encounter (Signed)
Yes, looks good. Thanks!

## 2019-03-28 ENCOUNTER — Ambulatory Visit: Payer: Medicaid Other | Admitting: Dietician

## 2019-04-06 ENCOUNTER — Other Ambulatory Visit: Payer: Self-pay | Admitting: *Deleted

## 2019-04-06 DIAGNOSIS — E785 Hyperlipidemia, unspecified: Secondary | ICD-10-CM

## 2019-04-06 NOTE — Telephone Encounter (Signed)
Next appt scheduled 11/10 with PCP. 

## 2019-04-09 MED ORDER — ROSUVASTATIN CALCIUM 20 MG PO TABS: 20.0000 mg | ORAL_TABLET | Freq: Every day | ORAL | 3 refills | Status: DC

## 2019-04-10 ENCOUNTER — Encounter: Payer: Medicaid Other | Admitting: Radiation Oncology

## 2019-04-13 ENCOUNTER — Ambulatory Visit: Payer: Medicaid Other | Admitting: Internal Medicine

## 2019-04-13 VITALS — BP 125/91 | HR 84 | Temp 98.4°F | Wt 189.9 lb

## 2019-04-13 DIAGNOSIS — Z8673 Personal history of transient ischemic attack (TIA), and cerebral infarction without residual deficits: Secondary | ICD-10-CM | POA: Diagnosis not present

## 2019-04-13 DIAGNOSIS — Z23 Encounter for immunization: Secondary | ICD-10-CM | POA: Diagnosis present

## 2019-04-13 DIAGNOSIS — Z79899 Other long term (current) drug therapy: Secondary | ICD-10-CM | POA: Diagnosis not present

## 2019-04-13 DIAGNOSIS — F419 Anxiety disorder, unspecified: Secondary | ICD-10-CM | POA: Insufficient documentation

## 2019-04-13 DIAGNOSIS — I10 Essential (primary) hypertension: Secondary | ICD-10-CM

## 2019-04-13 DIAGNOSIS — R7303 Prediabetes: Secondary | ICD-10-CM

## 2019-04-13 NOTE — Assessment & Plan Note (Signed)
Patient reports that she has anxiety related to her work.  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  Referral placed to integrated behavioral health.

## 2019-04-13 NOTE — Assessment & Plan Note (Signed)
Hemoglobin A1c of 6.2 one-month ago.  Patient states that she has recently started a diet and exercise routine.  Patient counseled on importance of diet exercise and weight loss for both prediabetes and hypertension.  Patient asked about repeating hemoglobin A1c, counseled that would not be indicated as last reading was 1 month ago.

## 2019-04-13 NOTE — Patient Instructions (Addendum)
Today we discussed your prediabetes.  I think you are making great improvements with diet and exercise, and these changes can help to greatly improve your blood sugar levels.  Your blood pressure was very good today!  Please continue taking your hydrochlorothiazide.  We have sent a referral to our behavioral health specialist, and the clinic will be in touch with you to schedule an appointment with her.  I think you could benefit from these appointments greatly given the high stress work that you do and the anxiety that results from it.  We have given you your flu shot today.  You might experience some muscle soreness or a low-grade fever for about a day afterwards.  However, you will not get the flu from the flu shot.  Thank you for allowing [place] to be part of your medical care!

## 2019-04-13 NOTE — Progress Notes (Signed)
   CC: Follow-up on prediabetes  HPI: Patient is a 48 year old female with past medical history acute ischemic stroke, prediabetes, hypertension who presents to discuss prediabetes.  Ms.Marylen Golebiewski is a 48 y.o.   Past Medical History:  Diagnosis Date  . Anxiety    self reported  . Asthma   . Depression    controlled  . Hyperlipidemia    controlled with medication  . IBS (irritable bowel syndrome)   . Stroke [facility]) 03-13-2015   Review of Systems:  Review of Systems  Constitutional: Negative for chills and fever.  HENT: Negative for congestion.   Respiratory: Negative for cough and shortness of breath.   Cardiovascular: Negative for chest pain.  Gastrointestinal: Negative for abdominal pain, constipation, diarrhea, nausea and vomiting.  Genitourinary: Negative for dysuria.  All other systems reviewed and are negative.   Physical Exam:  Vitals:   04/13/19 1027  BP: (!) 125/91  Pulse: 84  Temp: 98.4 F (36.9 C)  TempSrc: Oral  SpO2: 99%  Weight: 189 lb 14.4 oz (86.1 kg)   Physical Exam  Constitutional: She is well-developed, well-nourished, and in no distress.  HENT:  Head: Normocephalic and atraumatic.  Eyes: EOM are normal. Right eye exhibits no discharge. Left eye exhibits no discharge.  Neck: Normal range of motion. No tracheal deviation present.  Cardiovascular: Normal rate and regular rhythm. Exam reveals no gallop and no friction rub.  No murmur heard. Pulmonary/Chest: Effort normal and breath sounds normal. No respiratory distress. She has no wheezes. She has no rales.  Abdominal: Soft. She exhibits no distension. There is no abdominal tenderness. There is no rebound and no guarding.  Musculoskeletal: Normal range of motion.        General: No tenderness, deformity or edema.  Neurological: She is alert. Coordination normal.  Skin: Skin is warm and dry. No rash noted. She is not diaphoretic. No erythema.  Psychiatric: Memory and judgment normal.      Assessment & Plan:   See Encounters Tab for problem based charting.  Patient seen and discussed with Dr. Dareen Piano

## 2019-04-18 NOTE — Progress Notes (Signed)
Internal Medicine Clinic Attending  I saw and evaluated the patient.  I personally confirmed the key portions of the history and exam documented by Dr. MacLean and I reviewed pertinent patient test results.  The assessment, diagnosis, and plan were formulated together and I agree with the documentation in the resident's note.  

## 2019-04-30 ENCOUNTER — Encounter: Payer: Self-pay | Admitting: *Deleted

## 2019-05-09 ENCOUNTER — Ambulatory Visit (INDEPENDENT_AMBULATORY_CARE_PROVIDER_SITE_OTHER): Payer: Medicaid Other | Admitting: Licensed Clinical Social Worker

## 2019-05-09 DIAGNOSIS — F419 Anxiety disorder, unspecified: Secondary | ICD-10-CM

## 2019-05-10 ENCOUNTER — Encounter: Payer: Self-pay | Admitting: Licensed Clinical Social Worker

## 2019-05-10 NOTE — BH Specialist Note (Signed)
Integrated Behavioral Health Initial Visit  MRN: [id number] Name: Amber [name]  Number of [place] Clinician visits:: 1/6 Session Start time: 10:40  Session End time: 11:40 Total time: 60 minutes  Type of Service: [place] Interpretor:No.        SUBJECTIVE: Amber [name] is a 48 y.o. female whom attended the session individually. Patient was referred by Dr. Aundra Dubin for anxiety. Patient reports the following symptoms/concerns: anxiety and family stressors. Duration of problem: recurrent for many years; Severity of problem: mild  OBJECTIVE: Mood: Anxious and Affect: Appropriate Risk of harm to self or others: No plan to harm self or others  LIFE CONTEXT: Family and Social: Patient has a 85 year old daughter.  School/Work: Patient works in the Company secretary. Patient has additional stress that stems from her job.  Life Changes: Anniversary of her brother's passing.   GOALS ADDRESSED: Patient will: 1. Reduce symptoms of: anxiety and stress 2. Increase knowledge and/or ability of: stress reduction  3. Demonstrate ability to: Increase healthy adjustment to current life circumstances and Increase adequate support systems for patient/family  INTERVENTIONS: Interventions utilized: Mindfulness or Relaxation Training and Supportive Counseling  Standardized Assessments completed: assessed for SI, HI, and self-harm.  ASSESSMENT: Patient currently experiencing mild levels of recurrent episodes of anxiety. Patient shared her life story, and how past events can continue to trigger her anxiety in the present day. Patient has a job that can contribute to her overall levels of anxiety. Patient processed family dynamics and challenges within her family system that lead to periods of sadness and anxiety.    Patient may benefit from counseling.  PLAN: 1. Follow up with behavioral health clinician on : three weeks.   Dessie Coma,  [facility]

## 2019-05-17 ENCOUNTER — Ambulatory Visit: Payer: Medicaid Other

## 2019-05-18 ENCOUNTER — Other Ambulatory Visit: Payer: Self-pay

## 2019-05-18 ENCOUNTER — Ambulatory Visit: Payer: Medicaid Other | Admitting: Internal Medicine

## 2019-05-18 VITALS — BP 123/78 | HR 98 | Temp 98.2°F | Wt 187.6 lb

## 2019-05-18 DIAGNOSIS — R0789 Other chest pain: Secondary | ICD-10-CM | POA: Diagnosis not present

## 2019-05-18 DIAGNOSIS — K219 Gastro-esophageal reflux disease without esophagitis: Secondary | ICD-10-CM | POA: Diagnosis not present

## 2019-05-18 DIAGNOSIS — Z8673 Personal history of transient ischemic attack (TIA), and cerebral infarction without residual deficits: Secondary | ICD-10-CM

## 2019-05-18 DIAGNOSIS — Z79899 Other long term (current) drug therapy: Secondary | ICD-10-CM

## 2019-05-18 MED ORDER — PANTOPRAZOLE SODIUM 40 MG PO TBEC: 40.0000 mg | DELAYED_RELEASE_TABLET | Freq: Every day | ORAL | 1 refills | Status: DC

## 2019-05-18 NOTE — Progress Notes (Signed)
Internal Medicine Clinic Attending  Case discussed with Dr. Harbrecht  soon after the resident saw the patient.  We reviewed the resident's history and exam and pertinent patient test results.  I agree with the assessment, diagnosis, and plan of care documented in the resident's note.  

## 2019-05-18 NOTE — Patient Instructions (Addendum)
FOLLOW-UP INSTRUCTIONS When: As needed or with your PCP at a routine visit What to bring: All of your medications  I do not feel that your chest pain is high risk for cardiac source.  I do feel there is a possibility is related to acid reflux.  I believe treating with a medication called pantoprazole or Protonix at 40 mg daily for 3 months would be beneficial.  As always, please be certain to contact us if your symptoms return or worsen.  As always if your symptoms worsen, fail to improve, or you develop other concerning symptoms, please notify our office or visit the local ER if we are unavailable. Symptoms including severe unremitting chest pain, chest pain associated with activity, shortness of breath, any neurological changes such as vision change, weakness, numbness, tingling or speech changes, should not be ignored and should encourage you to visit the ED if we are unavailable by phone or the symptoms are severe.  Thank you for your visit to the [facility] today. If you have any questions or concerns please call us at [phone].

## 2019-05-18 NOTE — Assessment & Plan Note (Signed)
See chest pain from same day.  We will attempt to treat with a prescription of pantoprazole 40 mg daily x3 months.

## 2019-05-18 NOTE — Progress Notes (Signed)
   CC: right sided chest pain  HPI:[name] is a 48 y.o. female who presents for evaluation of chest pain. Please see individual problem based A/P for details.  Past Medical History:  Diagnosis Date  . Anxiety    self reported  . Asthma   . Depression    controlled  . Hyperlipidemia    controlled with medication  . IBS (irritable bowel syndrome)   . Stroke [facility]) 03-13-2015   Review of Systems:  ROS negative except as per HPI.  Physical Exam: Vitals:   05/18/19 1029  BP: 123/78  Pulse: 98  Temp: 98.2 F (36.8 C)  TempSrc: Oral  SpO2: 98%  Weight: 187 lb 9.6 oz (85.1 kg)   General: A/O x4, in no acute distress, afebrile, nondiaphoretic HEENT: PEERL, EMO intact Cardio: RRR, no mrg's, S1-S2 audible Pulmonary: CTA bilaterally, no wheezing or crackles  Abdomen: Bowel sounds normal, soft, nontender  MSK: BLE nontender, nonedematous Neuro: Alert, CNII-XII grossly intact, conversational, strength 5/5 in the upper and lower extremities bilaterally, normal gait Psych: Appropriate affect, not depressed in appearance, engages well  Assessment & Plan:   See Encounters Tab for problem based charting.  Patient discussed with Dr. Evette Doffing

## 2019-05-18 NOTE — Assessment & Plan Note (Signed)
Right sided chest pain: Under her right breast, squeezing in nature, occurs more commonly laying in her bed at rest but is intermittent and is worse with deep breaths at those times. Not associated with activity or exercise which she participates in regularly. Feels similar to a muscle spasm.  Was first noted perhaps four years prior when she had a stroke. 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  Never a smoker, does drink EtOH on occasion. Had hyperlipidemia at the time of her stroke as well as HTN but these have now improved with treatment.  CVA's x 4 years prior with chest tightness noted then.  On rosuvastatin 20mg  with most recent Lipid profile demonstrating improvement. Should have repeat lipid panel at next visit.  She appears low risk for cardiac related symptoms. Given the location, the relation with lying flat and occurring while watching TV while eating there is the potential this is GERD.  Plan: Pantoprazole 40 mg daily for 3 months Patient was given strict return precautions per AVS Please evaluate further based on symptom progression.

## 2019-05-22 ENCOUNTER — Encounter: Payer: Medicaid Other | Admitting: Radiation Oncology

## 2019-06-06 ENCOUNTER — Ambulatory Visit: Payer: Medicaid Other | Admitting: Licensed Clinical Social Worker

## 2019-06-12 ENCOUNTER — Ambulatory Visit: Payer: Medicaid Other | Attending: Internal Medicine

## 2019-06-12 ENCOUNTER — Ambulatory Visit (INDEPENDENT_AMBULATORY_CARE_PROVIDER_SITE_OTHER): Payer: Medicaid Other | Admitting: Licensed Clinical Social Worker

## 2019-06-12 ENCOUNTER — Other Ambulatory Visit: Payer: Self-pay

## 2019-06-12 ENCOUNTER — Encounter: Payer: Self-pay | Admitting: Licensed Clinical Social Worker

## 2019-06-12 DIAGNOSIS — Z20822 Contact with and (suspected) exposure to covid-19: Secondary | ICD-10-CM | POA: Diagnosis not present

## 2019-06-12 DIAGNOSIS — F419 Anxiety disorder, unspecified: Secondary | ICD-10-CM

## 2019-06-12 NOTE — BH Specialist Note (Signed)
Integrated Behavioral Health Visit via Telemedicine (Telephone)  06/12/2019 Amber [name] [id number]   Session Start time: 12:38  Session End time: 1:00 Total time: 22 minutes  Referring Provider: Dr. Shirlee Latch Type of Visit: Telephonic Patient location: Home [facility] Provider location: Office All persons participating in visit: Patient and [facility]  Confirmed patient's address: Yes  Confirmed patient's phone number: Yes  Any changes to demographics: No   Discussed confidentiality: Yes    The following statements were read to the patient and/or legal guardian that are established with the [facility] Provider.  "The purpose of this phone visit is to provide behavioral health care while limiting exposure to the coronavirus (COVID19).  There is a possibility of technology failure and discussed alternative modes of communication if that failure occurs."  "By engaging in this telephone visit, you consent to the provision of healthcare.  Additionally, you authorize for your insurance to be billed for the services provided during this telephone visit."   Patient and/or legal guardian consented to telephone visit: Yes   PRESENTING CONCERNS: Patient and/or family reports the following symptoms/concerns: anxiety and family stressors.  Duration of problem: recurrent for many years; Severity of problem: mild  GOALS ADDRESSED: Patient will: 1.  Reduce symptoms of: anxiety and stress  2.  Increase knowledge and/or ability of: coping skills, healthy habits and stress reduction  3.  Demonstrate ability to: Increase healthy adjustment to current life circumstances and Increase adequate support systems for patient/family  INTERVENTIONS: Interventions utilized:  Mindfulness or Relaxation Training and Supportive Counseling Standardized Assessments completed: Not Needed  ASSESSMENT: Patient currently experiencing mild anxiety due to work related stressors. Patient processed how she is trying to  make self-care a priority. Patient is working to create more balance between her professional and personal life. Patient's anxiety levels continue to be at a mild level.   Patient may benefit from counseling.   PLAN: 1. Follow up with behavioral health clinician on : two weeks.   Lysle Rubens, [facility], LCAS

## 2019-06-14 LAB — NOVEL CORONAVIRUS, NAA: SARS-CoV-2, NAA: NOT DETECTED

## 2019-06-14 NOTE — Addendum Note (Signed)
Addended by: Neomia Dear on: 06/14/2019 04:50 PM   Modules accepted: Orders

## 2019-06-19 ENCOUNTER — Encounter: Payer: Self-pay | Admitting: Radiation Oncology

## 2019-06-19 ENCOUNTER — Encounter: Payer: Medicaid Other | Admitting: Radiation Oncology

## 2019-07-02 ENCOUNTER — Ambulatory Visit: Payer: Medicaid Other | Attending: Internal Medicine

## 2019-07-02 DIAGNOSIS — Z20822 Contact with and (suspected) exposure to covid-19: Secondary | ICD-10-CM | POA: Diagnosis not present

## 2019-07-03 ENCOUNTER — Ambulatory Visit: Payer: Medicaid Other | Admitting: Licensed Clinical Social Worker

## 2019-07-03 ENCOUNTER — Ambulatory Visit: Payer: Medicaid Other | Admitting: Radiation Oncology

## 2019-07-03 ENCOUNTER — Encounter: Payer: Self-pay | Admitting: Radiation Oncology

## 2019-07-03 VITALS — BP 114/88 | HR 101 | Temp 98.4°F | Wt 189.0 lb

## 2019-07-03 DIAGNOSIS — R7303 Prediabetes: Secondary | ICD-10-CM

## 2019-07-03 DIAGNOSIS — Z6834 Body mass index (BMI) 34.0-34.9, adult: Secondary | ICD-10-CM | POA: Diagnosis not present

## 2019-07-03 DIAGNOSIS — E785 Hyperlipidemia, unspecified: Secondary | ICD-10-CM

## 2019-07-03 DIAGNOSIS — Z79899 Other long term (current) drug therapy: Secondary | ICD-10-CM

## 2019-07-03 DIAGNOSIS — E669 Obesity, unspecified: Secondary | ICD-10-CM | POA: Diagnosis not present

## 2019-07-03 DIAGNOSIS — I10 Essential (primary) hypertension: Secondary | ICD-10-CM | POA: Diagnosis not present

## 2019-07-03 DIAGNOSIS — F419 Anxiety disorder, unspecified: Secondary | ICD-10-CM

## 2019-07-03 DIAGNOSIS — Z8673 Personal history of transient ischemic attack (TIA), and cerebral infarction without residual deficits: Secondary | ICD-10-CM

## 2019-07-03 LAB — NOVEL CORONAVIRUS, NAA: SARS-CoV-2, NAA: NOT DETECTED

## 2019-07-03 NOTE — Assessment & Plan Note (Signed)
Patient with recent Hgb A1c of 6.2 in 03/2019. She has been trying to lose weight but struggling to get the weight off. We discussed starting an exercise regimen.  Plan: -walk 10 min 3x/week

## 2019-07-03 NOTE — Assessment & Plan Note (Signed)
Patient with recent uncontrolled anxiety who has been seen by our counselor here with great improvement in her mood. We discussed the benefits of exercise on anxiety and starting and exercise regimen.   Plan for controlled anxiety: -continue seeing counselor as needed -walk 10 min 3x/week

## 2019-07-03 NOTE — Progress Notes (Addendum)
   CC: anxiety  HPI:  [name] is a 49 y.o. female who presents to the Internal Medicine Clinic for follow up of their chronic medical problems. Please see Assessment and Plan for full HPI.  Past Medical History:  Diagnosis Date  . Anxiety    self reported  . Asthma   . Depression    controlled  . Hyperlipidemia    controlled with medication  . IBS (irritable bowel syndrome)   . Stroke [facility]) 03-13-2015   Review of Systems:  Please see Assessment and Plan for full ROS.  Physical Exam:  Vitals:   07/03/19 0856  BP: 114/88  Pulse: (!) 101  Temp: 98.4 F (36.9 C)  TempSrc: Oral  SpO2: 98%  Weight: 189 lb (85.7 kg)   Physical Exam  Constitutional: She is oriented to person, place, and time. No distress.  HENT:  Head: Normocephalic and atraumatic.  Cardiovascular: Normal rate, regular rhythm, normal heart sounds and intact distal pulses.  No murmur heard. Pulmonary/Chest: Effort normal and breath sounds normal. No respiratory distress. She exhibits no tenderness.  Abdominal: Soft. Bowel sounds are normal. She exhibits no distension. There is no abdominal tenderness.  Musculoskeletal:        General: Normal range of motion.     Cervical back: Normal range of motion.  Neurological: She is alert and oriented to person, place, and time.  Skin: Skin is warm and dry. She is not diaphoretic. No erythema.  Psychiatric: Affect normal.  Nursing note and vitals reviewed.  Assessment & Plan:   See Encounters Tab for problem based charting.  Patient discussed with Dr. Mikey Bussing

## 2019-07-03 NOTE — Assessment & Plan Note (Addendum)
Patient with hx of recent weight gain who has been seen previously for advice on weight loss. Unfortunately, patient has not lost any weight today. We discussed starting an exercise regimen which she is interested in.  Plan for uncontrolled obesity: -walk 10 min 3x/week to start, increase as tolerated

## 2019-07-03 NOTE — Assessment & Plan Note (Addendum)
Today's Vitals   07/03/19 0856  BP: 114/88  Pulse: (!) 101  Temp: 98.4 F (36.9 C)  TempSrc: Oral  SpO2: 98%  Weight: 189 lb (85.7 kg)   Body mass index is 34.57 kg/m.  Pt w/ hx of uncontrolled hypertension with good BP today at 114/88. She reports taking her HCTZ daily. We discussed the importance of exercise for BP control.  Plan for controlled hypertension: -continue HCTZ -walk 10 min 3x/week

## 2019-07-03 NOTE — Assessment & Plan Note (Addendum)
Pt with hx of adequately controlled hyperlipidemia in setting of hx of cryptogenic stroke. She is on rosuvostatin 40 mg daily w/ decent control of cholesterol on last lipid panel 2 years prior. 06/11/2016 lipid panel results: total 160, LDL 76 with goal < 70. She reports taking her medication regularly without side effects. We discussed the importance of exercise for cholesterol control.  Plan for controlled hyperlipidemia: -continue rosuvostatin -walk 10 min 3x/week

## 2019-07-03 NOTE — Patient Instructions (Addendum)
Thank you for coming to your appointment. It was so nice to see you. Today we discussed  Diagnoses and all orders for this visit:  Essential hypertension -blood pressure is great, keep up the good work!  Hyperlipidemia, unspecified hyperlipidemia type -continue taking rosuvostatin  Weight management -walk 10 minutes 3 times a week  Anxiety -continue talking to counselor as needed -exercise helps with anxiety as well  If labs were drawn today, I will call you with any abnormal results. Otherwise, please keep up the good work. I look forward to seeing you again soon at your follow up in 3 months.  Sincerely,  Jenell Milliner, MD

## 2019-07-08 NOTE — Progress Notes (Signed)
Internal Medicine Clinic Attending  Case discussed with Dr. Lanierat the time of the visit.  We reviewed the resident's history and exam and pertinent patient test results.  I agree with the assessment, diagnosis, and plan of care documented in the resident's note.   

## 2019-07-10 ENCOUNTER — Ambulatory Visit: Payer: Medicaid Other | Admitting: Licensed Clinical Social Worker

## 2019-07-12 ENCOUNTER — Ambulatory Visit: Payer: Medicaid Other | Attending: Internal Medicine

## 2019-07-12 DIAGNOSIS — Z20822 Contact with and (suspected) exposure to covid-19: Secondary | ICD-10-CM

## 2019-07-13 LAB — NOVEL CORONAVIRUS, NAA: SARS-CoV-2, NAA: NOT DETECTED

## 2019-07-18 ENCOUNTER — Ambulatory Visit (INDEPENDENT_AMBULATORY_CARE_PROVIDER_SITE_OTHER): Payer: Medicaid Other | Admitting: Licensed Clinical Social Worker

## 2019-07-18 ENCOUNTER — Other Ambulatory Visit: Payer: Self-pay

## 2019-07-18 DIAGNOSIS — F419 Anxiety disorder, unspecified: Secondary | ICD-10-CM

## 2019-07-18 NOTE — BH Specialist Note (Signed)
Integrated Behavioral Health Visit via Telemedicine (Telephone)  07/18/2019 Amber [name] [id number]   Session Start time: 11:09  Session End time: 11:30 Total time: 21 minutes  Referring Provider: Dr. Shirlee Latch Type of Visit: Telephonic Patient location: [facility] Provider location: Office All persons participating in visit: Patient and [facility]  Confirmed patient's address: Yes  Confirmed patient's phone number: Yes  Any changes to demographics: No   Discussed confidentiality: Yes    The following statements were read to the patient and/or legal guardian that are established with the [facility] Provider.  "The purpose of this phone visit is to provide behavioral health care while limiting exposure to the coronavirus (COVID19).  There is a possibility of technology failure and discussed alternative modes of communication if that failure occurs."  "By engaging in this telephone visit, you consent to the provision of healthcare.  Additionally, you authorize for your insurance to be billed for the services provided during this telephone visit."   Patient and/or legal guardian consented to telephone visit: Yes   PRESENTING CONCERNS: Patient and/or family reports the following symptoms/concerns: anxiety and family stressors. Duration of problem: recurrent for many years; Severity of problem: mild  GOALS ADDRESSED: Patient will: 1.  Reduce symptoms of: anxiety and stress  2.  Increase knowledge and/or ability of: coping skills and stress reduction  3.  Demonstrate ability to: Increase healthy adjustment to current life circumstances and Increase adequate support systems for patient/family  INTERVENTIONS: Interventions utilized:  Mindfulness or Relaxation Training, Brief CBT and Supportive Counseling Standardized Assessments completed: Not Needed  ASSESSMENT: Patient currently experiencing mild anxiety. Patient has a stressful job, that keeps her in crisis mode at times. Patient  has made improvements with focusing only on what she can control. Patient has improved staying in the present. Healthy boundaries were discussed to support her while working.   Patient may benefit from counseling.  PLAN: 1. Follow up with behavioral health clinician on : three weeks.   Lysle Rubens, [facility], LCAS

## 2019-07-24 ENCOUNTER — Encounter: Payer: Self-pay | Admitting: Licensed Clinical Social Worker

## 2019-08-08 ENCOUNTER — Other Ambulatory Visit: Payer: Self-pay

## 2019-08-08 ENCOUNTER — Ambulatory Visit (INDEPENDENT_AMBULATORY_CARE_PROVIDER_SITE_OTHER): Payer: Medicaid Other | Admitting: Licensed Clinical Social Worker

## 2019-08-08 ENCOUNTER — Encounter: Payer: Self-pay | Admitting: Licensed Clinical Social Worker

## 2019-08-08 DIAGNOSIS — F419 Anxiety disorder, unspecified: Secondary | ICD-10-CM

## 2019-08-08 NOTE — BH Specialist Note (Signed)
Integrated Behavioral Health Visit via Telemedicine (Telephone)  08/08/2019 Amber [name] [id number]   Session Start time: 11:10  Session End time: 11:30 Total time: 20 minutes  Referring Provider: Dr. Shirlee Latch Type of Visit: Telephonic Patient location: [facility] Provider location: Home All persons participating in visit: Patient and [facility]  Confirmed patient's address: Yes  Confirmed patient's phone number: Yes  Any changes to demographics: No   Discussed confidentiality: Yes    The following statements were read to the patient and/or legal guardian that are established with the [facility] Provider.  "The purpose of this phone visit is to provide behavioral health care while limiting exposure to the coronavirus (COVID19).  There is a possibility of technology failure and discussed alternative modes of communication if that failure occurs."  "By engaging in this telephone visit, you consent to the provision of healthcare.  Additionally, you authorize for your insurance to be billed for the services provided during this telephone visit."   Patient and/or legal guardian consented to telephone visit: Yes   PRESENTING CONCERNS: Patient and/or family reports the following symptoms/concerns: anxiety and family stressors. Duration of problem: recurrent for many years; Severity of problem: mild GOALS ADDRESSED: Patient will: 1.  Reduce symptoms of: anxiety and stress  2.  Increase knowledge and/or ability of: coping skills and stress reduction  3.  Demonstrate ability to: Increase healthy adjustment to current life circumstances and Increase adequate support systems for patient/family  INTERVENTIONS: Interventions utilized:  Mindfulness or Relaxation Training, Brief CBT and Supportive Counseling Standardized Assessments completed: Not Needed  ASSESSMENT: Patient currently experiencing mild anxiety. Patient has made some changes in her decision to engage in family interactions.  Patient is setting limits with her parents in order to reduce negative feelings from interactions with her parents. Patient's goal is to have balance, and the patient feels this is an area for improvement. Patient is working to compartmentalize when she leaves work.   Patient may benefit from counseling.  PLAN: 1. Follow up with behavioral health clinician on : three weeks.  Lysle Rubens, [facility], LCAS

## 2019-08-14 ENCOUNTER — Encounter: Payer: Self-pay | Admitting: Internal Medicine

## 2019-08-14 ENCOUNTER — Other Ambulatory Visit: Payer: Self-pay

## 2019-08-14 ENCOUNTER — Ambulatory Visit (INDEPENDENT_AMBULATORY_CARE_PROVIDER_SITE_OTHER): Payer: Medicaid Other | Admitting: Internal Medicine

## 2019-08-14 DIAGNOSIS — R41840 Attention and concentration deficit: Secondary | ICD-10-CM | POA: Diagnosis not present

## 2019-08-14 NOTE — Progress Notes (Signed)
Internal Medicine Clinic Attending  Case discussed with Dr. Harbrecht at the time of the visit.  We reviewed the resident's history and exam and pertinent patient test results.  I agree with the assessment, diagnosis, and plan of care documented in the resident's note.  Even Budlong, M.D., Ph.D.  

## 2019-08-14 NOTE — Progress Notes (Signed)
  [facility] Internal Medicine Residency Telephone Encounter Continuity Care Appointment  HPI:   This telephone encounter was created for [name] on 08/14/2019 for the following purpose/cc memory loss.   Past Medical History:  Past Medical History:  Diagnosis Date  . Anxiety    self reported  . Asthma   . Depression    controlled  . Hyperlipidemia    controlled with medication  . IBS (irritable bowel syndrome)   . Stroke (HCC) 03-13-2015      ROS:   Negative except as per HPI.   Assessment / Plan / Recommendations:   Please see A&P under problem oriented charting for assessment of the patient's acute and chronic medical conditions.   As always, pt is advised that if symptoms worsen or new symptoms arise, they should go to an urgent care facility or to to ER for further evaluation.   Consent and Medical Decision Making:   Patient discussed with Dr. Sandre Kitty  This is a telephone encounter between Amber [name] and Lanelle Bal on 08/14/2019 for memory concerns. The visit was conducted with the patient located at home and [employer] [facility]. The patient's identity was confirmed using their DOB and current address. The patient has consented to being evaluated through a telephone encounter and understands the associated risks (an examination cannot be done and the patient may need to come in for an appointment) / benefits (allows the patient to remain at home, decreasing exposure to coronavirus). I personally spent 27 minutes on medical discussion.

## 2019-08-14 NOTE — Assessment & Plan Note (Signed)
Memory loss: Patient concerned for memory loss that has progressively worsened after her stroke in 2016. She really noted this getting worse over the past year. She has felt as if she is not as sharp mentally since her stroke but never allowed her stroke to handicap her. Recently she has had more difficulty in concentrating and completing her notes from work. She stated that she now has more confusion when trying to complete tasks but did not get lost, lose her keys or have inability to perform her ADLs based on her discussion. 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 She attempts to recall the visits off hand while finishing the notes later that evening.  She does recall taking notes in the distant past but had not needed to do so a year or so prior.  She has been forgetting key details later in the evening when she sits to complete her notes.  She denotes many interruptions and thought distractions while attempting to complete her work in the evening. She is seeing fewer patients daily now than in the past. Her income is down ~12K as well due to the lower patient load from COVID related visit reductions. She now spends much longer to complete her notes and that she previously completed them in approximately 2 hours per evening but now this may take several days. She is not certain if she is forgetting information or if she is not concentrating while she is writing notes and or during her sessions. I feel that the latter sees more likely.  Her memory appears to be sharp per discussion it would be difficult to accurately quantify a change in.  Given her high level of functioning at baseline I am not aware of adequate mental testing modality that would benefit at this moment. Aside from advanced neurologic evaluation over a period of time there is not an easy way to quantify memory changes. After reviewing the note from Lysle Rubens from March  10, I do agree with the patient and Ms. Willa Rough that there appears to be a significant component of anxiety, job-related stress, home stress, Covid related stress, and disruption in her routine that is likely contributing to her concentration concerns. I do not see a current indication for advanced neurological testing but do feel that monitoring of her symptoms is warranted with reflex to testing if indicated at a later date. Do not see that this is directly related to her stroke from 2016 based on current information available.   Plan: I advised the patient to begin taking notes during patient encounters as she once did I advised that she exercise daily for at least of moderate intensity activity I advised that she continue to follow with Lysle Rubens for her anxiety and to utilize the techniques discussed such as compartmentalization, establishing balance, rebuilding herself within 2 able activities and improving sleep hygiene I advised a well-balanced diet I do not see a contraindication to taking omega-3 supplements or consuming foods high in omega-3's in a balanced manner

## 2019-08-28 ENCOUNTER — Other Ambulatory Visit: Payer: Self-pay

## 2019-08-28 ENCOUNTER — Ambulatory Visit: Payer: Medicaid Other | Admitting: Radiation Oncology

## 2019-08-28 ENCOUNTER — Encounter: Payer: Self-pay | Admitting: Radiation Oncology

## 2019-08-28 VITALS — BP 119/87 | HR 90 | Temp 97.7°F | Ht 62.0 in | Wt 188.9 lb

## 2019-08-28 DIAGNOSIS — E785 Hyperlipidemia, unspecified: Secondary | ICD-10-CM | POA: Diagnosis not present

## 2019-08-28 DIAGNOSIS — I10 Essential (primary) hypertension: Secondary | ICD-10-CM

## 2019-08-28 DIAGNOSIS — Z79899 Other long term (current) drug therapy: Secondary | ICD-10-CM | POA: Diagnosis not present

## 2019-08-28 DIAGNOSIS — Z0000 Encounter for general adult medical examination without abnormal findings: Secondary | ICD-10-CM

## 2019-08-28 DIAGNOSIS — R7303 Prediabetes: Secondary | ICD-10-CM | POA: Diagnosis not present

## 2019-08-28 MED ORDER — ROSUVASTATIN CALCIUM 10 MG PO TABS: 10.0000 mg | ORAL_TABLET | Freq: Every day | ORAL | 3 refills | Status: DC

## 2019-08-28 MED ORDER — HYDROCHLOROTHIAZIDE 12.5 MG PO CAPS: ORAL_CAPSULE | ORAL | 3 refills | Status: DC

## 2019-08-28 NOTE — Progress Notes (Signed)
   CC: hypertension  HPI:  [name] is a 49 y.o. female who presents to the Internal Medicine Clinic for follow up of her hypertension and other chronic medical problems. Please see Assessment and Plan for full HPI.  Past Medical History:  Diagnosis Date  . Anxiety    self reported  . Asthma   . Depression    controlled  . Hyperlipidemia    controlled with medication  . IBS (irritable bowel syndrome)   . Stroke [facility]) 03-13-2015   Review of Systems:  Please see Assessment and Plan for full ROS.  Physical Exam:  Vitals:   08/28/19 1444  BP: 119/87  Pulse: 90  Temp: 97.7 F (36.5 C)  TempSrc: Oral  SpO2: 99%  Weight: 188 lb 14.4 oz (85.7 kg)  Height: 5\' 2"  (1.575 m)   Physical Exam  Constitutional: She is oriented to person, place, and time. No distress.  Cardiovascular: Normal rate, regular rhythm, normal heart sounds and intact distal pulses.  No murmur heard. Pulmonary/Chest: Effort normal and breath sounds normal. No respiratory distress. She exhibits no tenderness.  Abdominal: Soft. Bowel sounds are normal. She exhibits no distension. There is no abdominal tenderness.  Musculoskeletal:        General: Normal range of motion.  Neurological: She is alert and oriented to person, place, and time.  Skin: Skin is warm and dry. She is not diaphoretic. No erythema.  Psychiatric: Affect normal.  Nursing note and vitals reviewed.  Assessment & Plan:   See Encounters Tab for problem based charting.  Patient discussed with Dr. 

## 2019-08-28 NOTE — Assessment & Plan Note (Signed)
Pt adherent to medication regimen of Crestor 20 daily.    Patient would like to reduce the dose of the medication and is confident she can reduce the necessity of it by making lifestyle change as well.  Discussed risks and benefits of decreasing medication dosage given stroke hx.  Plan: -reduce Crestor to 10 mg daily -increase physical activity to 3-4 x/week -reduce caloric intake -fu in 6 weeks

## 2019-08-28 NOTE — Assessment & Plan Note (Signed)
Patient due for pap smear. Scheduled for Monday as is her mammogram.

## 2019-08-28 NOTE — Assessment & Plan Note (Signed)
This is chronic and well controlled. Pt adherent to medication regimen of HCTZ 12.5 daily.    Today's Vitals   08/28/19 1444  BP: 119/87  Pulse: 90  Temp: 97.7 F (36.5 C)  TempSrc: Oral  SpO2: 99%  Weight: 188 lb 14.4 oz (85.7 kg)  Height: 5\' 2"  (1.575 m)   Body mass index is 34.55 kg/m.  Patient would like to reduce the dose of the medication and is confident she can reduce the necessity of it by making lifestyle change as well.  Discussed risks and benefits of decreasing medication dosage given stroke hx.  Plan: -reduce HCTZ to 6.25 (half a pill) daily -increase physical activity to 3-4 x/week -reduce caloric intake -fu in 6 weeks

## 2019-08-28 NOTE — Assessment & Plan Note (Signed)
Pt with previous hx of prediabetes with progressively increasing A1c. Patient requesting repeat a1c today.  Plan: -a1c today -increase physical activity to 3-4 x/week -reduce caloric intake -fu in 6 weeks

## 2019-08-28 NOTE — Patient Instructions (Addendum)
Thank you for coming to your appointment. It was so nice to see you. Today we discussed  Reducing cholesterol and blood pressure medicines in half.  Exercising 3-4 times per week and reducing caloric intake.  If labs were drawn today, I will call you with any abnormal results. Otherwise, please keep up the good work. I look forward to seeing you again soon at your follow up in 6 weeks.  Sincerely,  Jenell Milliner, MD

## 2019-08-29 ENCOUNTER — Encounter: Payer: Self-pay | Admitting: Licensed Clinical Social Worker

## 2019-08-29 ENCOUNTER — Ambulatory Visit (INDEPENDENT_AMBULATORY_CARE_PROVIDER_SITE_OTHER): Payer: Medicaid Other | Admitting: Licensed Clinical Social Worker

## 2019-08-29 DIAGNOSIS — F419 Anxiety disorder, unspecified: Secondary | ICD-10-CM

## 2019-08-29 LAB — LIPID PANEL
Chol/HDL Ratio: 2.5 ratio (ref 0.0–4.4)
Cholesterol, Total: 184 mg/dL (ref 100–199)
HDL: 75 mg/dL (ref >39)
LDL Chol Calc (NIH): 85 mg/dL (ref 0–99)
Triglycerides: 143 mg/dL (ref 0–149)
VLDL Cholesterol Cal: 24 mg/dL (ref 5–40)

## 2019-08-29 LAB — HEMOGLOBIN A1C
Est. average glucose Bld gHb Est-mCnc: 137 mg/dL
Hgb A1c MFr Bld: 6.4 % — ABNORMAL HIGH (ref 4.8–5.6)

## 2019-08-29 NOTE — BH Specialist Note (Signed)
Integrated Behavioral Health Visit via Telemedicine (Telephone)  08/29/2019 Amber [name] [id number]   Session Start time: 11;00  Session End time: 11;30 Total time: 30 minutes  Referring Provider: Dr. Shirlee Latch Type of Visit: Telephonic Patient location: [facility] Provider location: Office All persons participating in visit: Patient and [facility]  Confirmed patient's address: Yes  Confirmed patient's phone number: Yes  Any changes to demographics: No   Discussed confidentiality: Yes    The following statements were read to the patient and/or legal guardian that are established with the [facility] Provider.  "The purpose of this phone visit is to provide behavioral health care while limiting exposure to the coronavirus (COVID19).  There is a possibility of technology failure and discussed alternative modes of communication if that failure occurs."  "By engaging in this telephone visit, you consent to the provision of healthcare.  Additionally, you authorize for your insurance to be billed for the services provided during this telephone visit."   Patient and/or legal guardian consented to telephone visit: Yes   PRESENTING CONCERNS: Patient and/or family reports the following symptoms/concerns: anxiety, stressful job, family stressors Duration of problem: recurrent for many years; Severity of problem: mild  GOALS ADDRESSED: Patient will: 1.  Reduce symptoms of: anxiety and stress  2.  Increase knowledge and/or ability of: coping skills and stress reduction  3.  Demonstrate ability to: Increase healthy adjustment to current life circumstances and Increase adequate support systems for patient/family  INTERVENTIONS: Interventions utilized:  Mindfulness or Relaxation Training, Brief CBT and Supportive Counseling Standardized Assessments completed: Not Needed  ASSESSMENT: Patient currently experiencing mild levels of anxiety. Patient processed a recent loss, and how that is  impacting her daughter. Patient's job continues to be stressful, and she is working to continue to set healthy work-life-balance in place for herself. Patient reported she needs to lose weight per her doctor's request. Patient is in the preparation stage of change regarding weight loss, and she is hopeful that she can put necessary strategies into effect.   Patient may benefit from counseling  PLAN: 1. Follow up with behavioral health clinician on : three weeks.  Lysle Rubens, [facility], LCAS

## 2019-09-03 DIAGNOSIS — Z1231 Encounter for screening mammogram for malignant neoplasm of breast: Secondary | ICD-10-CM | POA: Diagnosis not present

## 2019-09-03 DIAGNOSIS — K629 Disease of anus and rectum, unspecified: Secondary | ICD-10-CM | POA: Diagnosis not present

## 2019-09-03 DIAGNOSIS — Z0000 Encounter for general adult medical examination without abnormal findings: Secondary | ICD-10-CM | POA: Diagnosis not present

## 2019-09-04 NOTE — Progress Notes (Signed)
Internal Medicine Clinic Attending  Case discussed with Dr. Lanierat the time of the visit.  We reviewed the resident's history and exam and pertinent patient test results.  I agree with the assessment, diagnosis, and plan of care documented in the resident's note.   

## 2019-09-26 ENCOUNTER — Telehealth: Payer: Self-pay | Admitting: Licensed Clinical Social Worker

## 2019-09-26 ENCOUNTER — Ambulatory Visit: Payer: Medicaid Other | Admitting: Licensed Clinical Social Worker

## 2019-09-26 NOTE — Telephone Encounter (Signed)
Patient was called for scheduled appointment. Patient did not answer, and a vm was left for the patient to call the office back to reschedule.

## 2019-09-28 ENCOUNTER — Ambulatory Visit: Payer: Medicaid Other

## 2019-10-09 ENCOUNTER — Encounter: Payer: Medicaid Other | Admitting: Radiation Oncology

## 2019-10-15 ENCOUNTER — Other Ambulatory Visit: Payer: Self-pay

## 2019-10-15 ENCOUNTER — Ambulatory Visit: Payer: Medicaid Other | Admitting: Internal Medicine

## 2019-10-15 VITALS — BP 136/99 | HR 94 | Temp 98.4°F | Ht 62.0 in | Wt 187.1 lb

## 2019-10-15 DIAGNOSIS — J301 Allergic rhinitis due to pollen: Secondary | ICD-10-CM | POA: Insufficient documentation

## 2019-10-15 MED ORDER — DIPHENHYDRAMINE-PHENYLEPHRINE 25-10 MG PO TABS: 1.0000 | ORAL_TABLET | ORAL | 0 refills | Status: DC | PRN

## 2019-10-15 NOTE — Assessment & Plan Note (Signed)
Amber [name] is a 49 yo F w/ PMH of HTN, CVA, GERD, HLD presenting to [facility] w/ complaint of runny nose and sinus congestion. She was in her usual state of health until last Friday when she had her grass mowed. Lot of the grass clippings were left on her door step and she spent the day cleaning up her porch without a mask on. That afternoon she began to develop progressively worsening rhinorrhea, cough, and congestion. Over the weekend, she has tried Afrin nasal congestion sprays, Nyquil PM and other decongestants with mild relief. She mentions having an important resposibilty this Friday and hoping to get her symptoms controlled prior to this.  A/P Presenting with cough, rhinorrhea, sinus congestion. Per history appear to be due to allergic rhinitis from massive exposure to allergens. Advised to treat with anti-histamine therapy. Provided instructions on sinus rinse for symptoms relief. Discussed red-flag sxs for bacterial sinusitis.  - Start OTC anti-histamines (benadryl vs zyrtec vs allegra) - C/w nyquil, afrin spray - Sinus rinse instructions provided.

## 2019-10-15 NOTE — Progress Notes (Signed)
   CC: Runny nose  HPI: Ms.Lashawne Stave is a 49 y.o. with PMH listed below presenting with complaint of allergic rhinitis. Please see problem based assessment and plan for further details.  Past Medical History:  Diagnosis Date  . Anxiety    self reported  . Asthma   . Depression    controlled  . Hyperlipidemia    controlled with medication  . IBS (irritable bowel syndrome)   . Stroke [facility]) 03-13-2015   Review of Systems: Review of Systems  Constitutional: Negative for chills, fever and malaise/fatigue.  HENT: Positive for congestion and sinus pain.   Eyes: Negative for blurred vision.  Respiratory: Positive for cough. Negative for sputum production and shortness of breath.   Cardiovascular: Negative for chest pain, palpitations and leg swelling.  Gastrointestinal: Negative for constipation, diarrhea, nausea and vomiting.  Neurological: Positive for headaches. Negative for dizziness, tingling, sensory change and focal weakness.     Physical Exam: Vitals:   10/15/19 1330  BP: (!) 136/99  Pulse: 94  Temp: 98.4 F (36.9 C)  TempSrc: Oral  SpO2: 98%  Weight: 187 lb 1.6 oz (84.9 kg)  Height: 5\' 2"  (1.575 m)   Physical Exam  Constitutional: She is oriented to person, place, and time. She appears well-developed. She appears distressed (Ill-appearing).  HENT:  Head: Normocephalic and atraumatic.  Sinus tenderness bilaterally. Edematous, Erythematous nasal turbinates with significant clear drainage.  Eyes: Conjunctivae are normal. No scleral icterus.  Cardiovascular: Normal rate, regular rhythm, normal heart sounds and intact distal pulses.  No murmur heard. Respiratory: Effort normal and breath sounds normal. She has no wheezes. She has no rales.  GI: Soft. Bowel sounds are normal. She exhibits no distension. There is no abdominal tenderness.  Musculoskeletal:        General: No edema. Normal range of motion.     Cervical back: Normal range of motion and neck supple.    Lymphadenopathy:    She has no cervical adenopathy.  Neurological: She is alert and oriented to person, place, and time.  Skin: Skin is warm and dry.    Assessment & Plan:   Allergic rhinitis due to pollen Ms.Harlacher is a 49 yo F w/ PMH of HTN, CVA, GERD, HLD presenting to [facility] w/ complaint of runny nose and sinus congestion. She was in her usual state of health until last Friday when she had her grass mowed. Lot of the grass clippings were left on her door step and she spent the day cleaning up her porch without a mask on. That afternoon she began to develop progressively worsening rhinorrhea, cough, and congestion. Over the weekend, she has tried Afrin nasal congestion sprays, Nyquil PM and other decongestants with mild relief. She mentions having an important resposibilty this Friday and hoping to get her symptoms controlled prior to this.  A/P Presenting with cough, rhinorrhea, sinus congestion. Per history appear to be due to allergic rhinitis from massive exposure to allergens. Advised to treat with anti-histamine therapy. Provided instructions on sinus rinse for symptoms relief. Discussed red-flag sxs for bacterial sinusitis.  - Start OTC anti-histamines (benadryl vs zyrtec vs allegra) - C/w nyquil, afrin spray - Sinus rinse instructions provided.    Patient discussed with Dr. Wednesday   -Sandre Kitty, PGY2 [facility] Health Internal Medicine Pager: [phone]

## 2019-10-15 NOTE — Patient Instructions (Signed)
Thank you for allowing [place] to provide your care today. Today we discussed your sinus congestion    I have ordered no labs for you. I will call if any are abnormal.    Today we made the following changes to your medications.    Please take anti-histamines (Claritin, Benadryl, Allegra, Zyrtec) for your nasal congestion  Continue to use your Afrin nasal spray  Please follow-up as needed if your symptoms do not improve after 7 days  Should you have any questions or concerns please call the internal medicine clinic at [phone].     How to Perform a Sinus Rinse A sinus rinse is a home treatment. It rinses your sinuses with a mixture of salt and water (saline solution). Sinuses are air-filled spaces in your skull behind the bones of your face and forehead. They open into your nasal cavity. A sinus rinse can help to clear your nasal cavity. It can clear mucus, dirt, dust, or pollen. You may do a sinus rinse when you have:  A cold.  A virus.  Allergies.  A sinus infection.  A stuffy nose. Talk with your doctor about whether a sinus rinse might help you. What are the risks? A sinus rinse is normally very safe and helpful. However, there are a few risks. These include:  A burning feeling in the sinuses. This may happen if you do not make the saline solution as instructed. Be sure to follow all directions when making the saline solution.  Nasal irritation.  Infection from unclean water. This is rare, but possible. Do not do a sinus rinse if you have had:  Ear or nasal surgery.  An ear infection.  Blocked ears. Supplies needed:  Saline solution or powder.  Distilled or germ-free (sterile) water may be needed to mix with saline powder. ? You may use boiled and cooled tap water. Boil tap water for 5 minutes; cool until it is lukewarm. Use within 24 hours. ? Do not use regular tap water to mix with the saline solution.  Neti pot or nasal rinse bottle. This releases the saline  solution into your nose and through your sinuses. You can buy neti pots and rinse bottles: ? At your local pharmacy. ? At a health food store. ? Online. How to perform a sinus rinse  1. Wash your hands with soap and water. 2. Wash your device using the directions that came with it. 3. Dry your device. 4. Use the solution that comes with your device or one that is sold separately in stores. Follow the mixing directions on the package if you need to mix with sterile or distilled water. 5. Fill your device with the amount of saline solution stated in the device instructions. 6. Stand over a sink and tilt your head sideways over the sink. 7. Place the spout of the device in your upper nostril (the one closer to the ceiling). 8. Gently pour or squeeze the saline solution into your nasal cavity. The liquid should drain to your lower nostril if you are not too stuffed up (congested). 9. While rinsing, breathe through your open mouth. 10. Gently blow your nose to clear any mucus and rinse solution. Blowing too hard may cause ear pain. 11. Repeat in your other nostril. 12. Clean and rinse your device with clean water. 13. Air-dry your device. Talk with your doctor or pharmacist if you have questions about how to do a sinus rinse. Summary  A sinus rinse is a home treatment. It rinses  your sinuses with a mixture of salt and water (saline solution).  A sinus rinse is normally very safe and helpful. Follow all instructions carefully.  Talk with your doctor about whether a sinus rinse might help you. This information is not intended to replace advice given to you by your health care provider. Make sure you discuss any questions you have with your health care provider. Document Revised: 03/14/2017 Document Reviewed: 03/14/2017 Elsevier Patient Education  2020 [employer].

## 2019-10-16 ENCOUNTER — Telehealth: Payer: Self-pay | Admitting: *Deleted

## 2019-10-16 DIAGNOSIS — J0100 Acute maxillary sinusitis, unspecified: Secondary | ICD-10-CM

## 2019-10-16 MED ORDER — AMOXICILLIN 500 MG PO CAPS: 500.0000 mg | ORAL_CAPSULE | Freq: Three times a day (TID) | ORAL | 0 refills | Status: AC

## 2019-10-16 NOTE — Telephone Encounter (Addendum)
Spoke with Amber [name] over the phone. She mentions worsening symptoms overnight despite starting anti-histamine therapy. She mentions that last night she tried benadryl without any improvement in her symptoms as well as some zyrtec. She also noted that she is beginning to develop worsening tenderness over her cheeks that radiates up her head with nausea and vomiting and subjective chills. Denies any fevers. Concerning for development of bacterial sinusitis with worsening and changing quality of symptoms. Short course of amoxicillin sent to pharmacy.

## 2019-10-16 NOTE — Telephone Encounter (Signed)
Pt states she cannot wait 7 days wants ABX today, states she feels horrible [phone]

## 2019-10-16 NOTE — Progress Notes (Signed)
Internal Medicine Clinic Attending  Case discussed with Dr. Lee at the time of the visit.  We reviewed the resident's history and exam and pertinent patient test results.  I agree with the assessment, diagnosis, and plan of care documented in the resident's note.  Shandale Malak, M.D., Ph.D.  

## 2019-10-23 ENCOUNTER — Encounter: Payer: Self-pay | Admitting: Radiation Oncology

## 2019-10-23 ENCOUNTER — Ambulatory Visit: Payer: Medicaid Other | Admitting: Radiation Oncology

## 2019-10-23 VITALS — BP 131/84 | HR 91 | Temp 98.5°F | Wt 187.7 lb

## 2019-10-23 DIAGNOSIS — Z0000 Encounter for general adult medical examination without abnormal findings: Secondary | ICD-10-CM | POA: Diagnosis not present

## 2019-10-23 DIAGNOSIS — E785 Hyperlipidemia, unspecified: Secondary | ICD-10-CM | POA: Diagnosis not present

## 2019-10-23 DIAGNOSIS — Z794 Long term (current) use of insulin: Secondary | ICD-10-CM

## 2019-10-23 DIAGNOSIS — E119 Type 2 diabetes mellitus without complications: Secondary | ICD-10-CM | POA: Diagnosis not present

## 2019-10-23 DIAGNOSIS — I10 Essential (primary) hypertension: Secondary | ICD-10-CM | POA: Diagnosis not present

## 2019-10-23 NOTE — Patient Instructions (Addendum)
Thank you for coming to your appointment. It was so nice to see you. Today we discussed  Diagnoses and all orders for this visit:  Hypertension Weight Diabetes  -continue exercise and diet changes; exercise 30 minutes 4 days a week; do not drink any soda -follow up in 4-6 weeks for Hgb A1c check   Sincerely,  Jenell Milliner, MD

## 2019-10-23 NOTE — Progress Notes (Signed)
   CC: hypertension  HPI:  Ms.Kenyonna Tye is a 49 y.o. female who presents to the Internal Medicine Clinic for hypertension and for follow up of their chronic medical problems. Please see Assessment and Plan for full HPI.  Past Medical History:  Diagnosis Date  . Anxiety    self reported  . Asthma   . Depression    controlled  . Hyperlipidemia    controlled with medication  . IBS (irritable bowel syndrome)   . Stroke [facility]) 03-13-2015   Review of Systems:  Please see Assessment and Plan for full ROS.  Physical Exam:  Vitals:   10/23/19 1330  BP: 131/84  Pulse: 91  Temp: 98.5 F (36.9 C)  TempSrc: Oral  SpO2: 98%  Weight: 187 lb 11.2 oz (85.1 kg)   Physical Exam  Constitutional: She is well-developed, well-nourished, and in no distress. No distress.  HENT:  Head: Normocephalic and atraumatic.  Cardiovascular: Normal rate, regular rhythm and normal heart sounds.  No murmur heard. Pulmonary/Chest: Effort normal and breath sounds normal. No respiratory distress.  Abdominal: Soft. Bowel sounds are normal. She exhibits no distension.  Musculoskeletal:        General: Normal range of motion.  Neurological: She is alert. Coordination normal.  Skin: Skin is warm and dry. She is not diaphoretic.  Psychiatric: Affect normal.  Nursing note and vitals reviewed.  Assessment & Plan:   See Encounters Tab for problem based charting.  Patient discussed with Dr. Sandre Kitty

## 2019-10-24 ENCOUNTER — Encounter: Payer: Self-pay | Admitting: Radiation Oncology

## 2019-10-24 NOTE — Progress Notes (Signed)
Internal Medicine Clinic Attending  Case discussed with Dr. Lanier at the time of the visit.  We reviewed the resident's history and exam and pertinent patient test results.  I agree with the assessment, diagnosis, and plan of care documented in the resident's note.  Jenae Tomasello, M.D., Ph.D.  

## 2019-10-24 NOTE — Assessment & Plan Note (Signed)
Pt with previous hx of prediabetes recently progressed to diabetes with Hgb A1c at 6.4. At last visit we talked extensively about the risks associated with diabetes given her previous vascular disease. She requested to use this as motivation to make life style changes prior to starting any medications. She would prefer to take as few medications as possible. Unfortunately, she has not lost weight or made sustained lifestyle changes. She is still hopeful to make meaningful changes to diet and exercise prior to her next Hgb A1c check in 6 weeks. Given the importance of maintaining a partnership with our patients, I am in agreement with her continuing to try to make lifestyle changes prior to starting any medications. I readdressed the importance of treating her comorbidities to prevent future vascular events and she was understanding. She thinks she needs to start to accept her stroke and that she may require medications. She is hopeful she can make some lifestyle changes to prevent needing to start medications for diabetes.  Plan: -increase physical activity to 3-4 x/week -reduce caloric intake -fu in 6 weeks for A1c check; if still in diabetic range, given her hx of stroke, may need to start metformin if patient amenable

## 2019-10-24 NOTE — Assessment & Plan Note (Signed)
Patient recently decreased her Crestor from 20 to 10 mg daily as patient hoping to decrease the amount of medications she takes. We discussed at lengths the benefits of Crestor given her hx of stroke and the benefits of the higher dose. She requested she decrease the dose as motivation to make lifestyle changes. Unfortunately, she hasn't had significant weight loss or sustained lifestyle changes since our last visit. We discussed at length again that while some life style changes can reduce our need for medicines, some medicines provide a benefit no matter our weight, diet and exercise regimen. She requested to take 20 mg daily for a week and 10 mg daily for a week with plan to readdress at follow up in 6 mo. As it is important that we maintain a partnership with our patients, I was in agreement with this plan.  Plan: -20 mg Crestor daily for one week, 10 mg Crestor daily for one week -fu in 6 weeks

## 2019-10-24 NOTE — Assessment & Plan Note (Signed)
Patient has received one dose of the COVID-19 vaccine. Scheduled for her second dose this week. Has laryngitis for which she has completed a course of antibiotics. No worsening of sx and no fevers. Instructed patient there are no contraindications to get the COVID-19 vaccine with a mild illness such as a cold per CDC guidelines.

## 2019-10-24 NOTE — Assessment & Plan Note (Signed)
This is chronic and well controlled.  Vitals:   10/23/19 1330  BP: 131/84  Pulse: 91  Temp: 98.5 F (36.9 C)  SpO2: 98%   Patient's blood pressure well controlled on 6.25 mg of HCTZ. Unfortunately, she hasn't made sustained lifestyle changes yet. She lost two pounds per our scales but reports her weight can fluctuate.   Plan: -continue HCTZ 6.25 daily -increase physical activity to 3-4 x/week -reduce caloric intake -fu in 6 weeks

## 2019-12-10 ENCOUNTER — Other Ambulatory Visit: Payer: Self-pay

## 2019-12-10 ENCOUNTER — Encounter: Payer: Self-pay | Admitting: Internal Medicine

## 2019-12-10 ENCOUNTER — Ambulatory Visit: Payer: Medicaid Other | Admitting: Internal Medicine

## 2019-12-10 VITALS — BP 121/87 | HR 95 | Temp 98.3°F | Ht 62.0 in | Wt 187.2 lb

## 2019-12-10 DIAGNOSIS — S90852A Superficial foreign body, left foot, initial encounter: Secondary | ICD-10-CM | POA: Diagnosis not present

## 2019-12-10 DIAGNOSIS — F419 Anxiety disorder, unspecified: Secondary | ICD-10-CM

## 2019-12-10 DIAGNOSIS — T148XXA Other injury of unspecified body region, initial encounter: Secondary | ICD-10-CM

## 2019-12-10 DIAGNOSIS — L658 Other specified nonscarring hair loss: Secondary | ICD-10-CM

## 2019-12-10 NOTE — Patient Instructions (Signed)
Thank you, Ms.[name] for allowing [place] to provide your care today. Today we discussed Hair loss, Splinter, and Anxiety.     I have place a referrals back to [facility].   I have ordered the following tests: none   I have ordered the following medication/changed the following medications:  1. begin Topical Minoxidil 2% to scalp twice daily 2.   Please follow-up in 1 week  Should you have any questions or concerns please call the internal medicine clinic at [phone].    Dellia Cloud, D.O. [facility]   My Chart Access: [email or web address]   If you have not already done so, please get your COVID 19 vaccine  To schedule an appointment for a COVID vaccine choice any of the following: Go to [email or web address]   Go to [email or web address]                  Call [phone]                                     Call [phone] and select Option 2

## 2019-12-10 NOTE — Progress Notes (Signed)
CC: Hair loss  HPI:  [name] is a 49 y.o. female with a past medical history stated below and presents today for evaluation and management of hair loss. Please see problem based assessment and plan for additional details.  Past Medical History:  Diagnosis Date  . Anxiety    self reported  . Asthma   . Depression    controlled  . Hyperlipidemia    controlled with medication  . IBS (irritable bowel syndrome)   . Stroke [facility]) 03-13-2015    Current Outpatient Medications on File Prior to Visit  Medication Sig Dispense Refill  . Ascorbic Acid (VITAMIN C) 1000 MG tablet Take 1,000 mg by mouth daily.    Marland Kitchen aspirin EC 81 MG tablet Take 81 mg by mouth daily.    . cholecalciferol (VITAMIN D3) 25 MCG (1000 UT) tablet Take 1,000 Units by mouth daily.    . cyclobenzaprine (FEXMID) 7.5 MG tablet Take 1 tablet (7.5 mg total) by mouth 3 (three) times daily as needed for muscle spasms. 21 tablet 0  . diclofenac sodium (VOLTAREN) 1 % GEL Apply 2-4 g topically 4 (four) times daily. 100 g 3  . diphenhydrAMINE-Phenylephrine 25-10 MG TABS Take 1 tablet by mouth every 4 (four) hours as needed. Max dose (6 tablets) 24 tablet 0  . Elderberry 575 MG/5ML SYRP Take 2 mg/mL by mouth.    . hydrochlorothiazide (MICROZIDE) 12.5 MG capsule Take one half pill daily. 45 capsule 3  . linaclotide (LINZESS) 72 MCG capsule Take 1 capsule (72 mcg total) by mouth daily before breakfast. (Patient not taking: Reported on 12/25/2018) 30 capsule 10  . meloxicam (MOBIC) 15 MG tablet Take 1 tablet (15 mg total) by mouth daily. (Patient not taking: Reported on 12/25/2018) 14 tablet 0  . Multiple Vitamins-Minerals (MULTIVITAMIN WITH MINERALS) tablet Take 1 tablet by mouth daily.    . pantoprazole (PROTONIX) 40 MG tablet Take 1 tablet (40 mg total) by mouth daily. 90 tablet 1  . rosuvastatin (CRESTOR) 10 MG tablet Take 1 tablet (10 mg total) by mouth daily. 90 tablet 3   No current facility-administered medications on  file prior to visit.    Family History  Problem Relation Age of Onset  . Arthritis Father   . Diabetes Father   . Hypertension Father   . Hyperlipidemia Father   . Prostate cancer Father   . Hyperlipidemia Mother   . Stroke Paternal Grandmother   . Heart attack Maternal Grandfather   . Hyperlipidemia Maternal Grandfather   . Prostate cancer Maternal Grandfather   . Heart attack Paternal Grandfather   . Prostate cancer Maternal Uncle        x 3  . Colon cancer Neg Hx   . Colon polyps Neg Hx   . Stomach cancer Neg Hx   . Esophageal cancer Neg Hx   . Pancreatic cancer Neg Hx     Social History   Socioeconomic History  . Marital status: Married    Spouse name: Not on file  . Number of children: Not on file  . Years of education: Not on file  . Highest education level: Not on file  Occupational History  . Not on file  Tobacco Use  . Smoking status: Never Smoker  . Smokeless tobacco: Never Used  Vaping Use  . Vaping Use: Never used  Substance and Sexual Activity  . Alcohol use: Yes    Alcohol/week: 7.0 standard drinks    Types: 7 Glasses of wine per week  .  Drug use: Yes    Types: Marijuana    Comment: 2x/month  . Sexual activity: Not on file  Other Topics Concern  . Not on file  Social History Narrative   Originally from [place]   Family lives up here in [place]   Has one daughter.   Enjoys spending time shopping and spending time with family    Social Determinants of Health   Financial Resource Strain:   . Difficulty of Paying Living Expenses:   Food Insecurity:   . Worried About Programme researcher, broadcasting/film/video in the Last Year:   . Barista in the Last Year:   Transportation Needs:   . Freight forwarder (Medical):   Marland Kitchen Lack of Transportation (Non-Medical):   Physical Activity:   . Days of Exercise per Week:   . Minutes of Exercise per Session:   Stress:   . Feeling of Stress :   Social Connections:   . Frequency of Communication with  Friends and Family:   . Frequency of Social Gatherings with Friends and Family:   . Attends Religious Services:   . Active Member of Clubs or Organizations:   . Attends Banker Meetings:   Marland Kitchen Marital Status:   Intimate Partner Violence:   . Fear of Current or Ex-Partner:   . Emotionally Abused:   Marland Kitchen Physically Abused:   . Sexually Abused:     Review of Systems: ROS negative except for what is noted on the assessment and plan.  Vitals:   12/10/19 1326  BP: 121/87  Pulse: 95  Temp: 98.3 F (36.8 C)  TempSrc: Oral  SpO2: 98%  Weight: 187 lb 3.2 oz (84.9 kg)  Height: 5\' 2"  (1.575 m)     Physical Exam: Physical Exam Constitutional:      Appearance: Normal appearance.  Cardiovascular:     Rate and Rhythm: Normal rate.     Pulses: Normal pulses.     Heart sounds: Normal heart sounds.  Pulmonary:     Effort: Pulmonary effort is normal.     Breath sounds: Normal breath sounds.  Abdominal:     Tenderness: There is no abdominal tenderness.  Musculoskeletal:        General: No swelling.     Cervical back: Normal range of motion.     Right lower leg: No edema.     Left lower leg: No edema.  Skin:    General: Skin is warm and dry.     Findings: Wound (splinter on plantar surface of left foot) present.     Comments: Hair thinning at the vertex of scalp and bilateral temporal hairline. Negative Tug test. No erythema, flaking, or rash.   Neurological:     Mental Status: She is alert and oriented to person, place, and time. Mental status is at baseline.  Psychiatric:        Attention and Perception: Attention normal.        Mood and Affect: Mood normal.        Speech: Speech normal.        Behavior: Behavior normal.        Thought Content: Thought content normal.      Media Information  Media Information    Assessment & Plan:   See Encounters Tab for problem based charting.  Patient discussed with Dr. , D.O. [facility]  Medicine, PGY-2 Pager: [phone], Phone: [phone] Date 12/11/2019 Time 7:12 AM

## 2019-12-11 ENCOUNTER — Encounter: Payer: Self-pay | Admitting: Internal Medicine

## 2019-12-11 DIAGNOSIS — L658 Other specified nonscarring hair loss: Secondary | ICD-10-CM | POA: Insufficient documentation

## 2019-12-11 DIAGNOSIS — T148XXA Other injury of unspecified body region, initial encounter: Secondary | ICD-10-CM | POA: Insufficient documentation

## 2019-12-11 NOTE — Assessment & Plan Note (Signed)
Patient presents for a follow up for her anxiety. She states her anxiety has gotten worse recently with significant stressors in her life, including balancing being a parent to her 47 y.o. child and her professional life. She is also having a difficult time with her father who is suffering from substance use disorder. Furthermore, one of her Aunt recently passed away, which has been particularly difficult. She admits to worsening fatigue that is present most days. She is also having a difficult time "turing her mind off" at nights and will often use edible THC/Cannibis to help her relax. She admits to fluctuations in weight but no specific change in pattern of eating. She denies anhedonia, but does admit to decreased confidence/self worth. This is specifically in the context of her recent hair loss and how it can make her feel self conscious.   She did not have enough time to perform the GAD7, but I walked through many of the questions with her and likely has mild to moderate anxiety. Patient did not have enough time to finish the PHQ-9 either. It is difficult to determine if the patient meets the diagnostic criteria for GAD/MDD or dysthymic disorder with mixed anxious features. She will likely further evaluation and management in the near future.   She states that is not interested in medication management at this time, but would like to go back to [facility] for counseling.  Plan: - Refer to [facility] for counseling - Follow up in 1 week to perform full screening.

## 2019-12-11 NOTE — Assessment & Plan Note (Signed)
Patient presents with a roughly a year history of hair loss. She states that her symptoms have slowly progressed over this time. She does admit to significant stressors in her life including dealing with her father who is suffering form substance use disorder and the demands of her professional life. She denies frequently pulling/rubbing her head and does not use hair extensions. She does admit to tingling feeling at the vertex of her scalp but denies any rashes, flaking, or other skin changes. She does admit to using tea tree and peppermint oil on hair daily. She denies any symptoms concerning for increased androgens, including hirsutism, voices changes,or ance.   On exam, she has hair thinning at the vertex of her scalp and the bilateral temporal hairline. She is not skin changes noted. Hair tug test was negative.   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 I counseled her regarding the importance of using this medication consistently to maintain hair and cessation will result in hair loss. I also counseled her on decreasing/stopping peppermint and tea tree oil while using this medication to decrease risk of irritation.  Plan: - Start Minoxidil 2% topical gel BID.

## 2019-12-11 NOTE — Assessment & Plan Note (Addendum)
Patient presents with a small splinter on the plantar surface of her left foot. The surrounding skin is negative for erythema, edema, or rashes. The skin was cleaned with alcohol pad and a 15 blade was used to gently scrape away the overlying dermis. Forceps were used to remove the splinter. Patient tolerated the procedure well. There was no blood loss or complications during this procedure. Patient was instructed to keep the surrounding area clean and dry.

## 2019-12-11 NOTE — Progress Notes (Signed)
Internal Medicine Clinic Attending  I saw and evaluated the patient.  I personally confirmed the key portions of the history and exam documented by Dr. Coe and I reviewed pertinent patient test results.  The assessment, diagnosis, and plan were formulated together and I agree with the documentation in the resident's note.    

## 2019-12-17 ENCOUNTER — Ambulatory Visit (INDEPENDENT_AMBULATORY_CARE_PROVIDER_SITE_OTHER): Payer: Medicaid Other | Admitting: Internal Medicine

## 2019-12-17 VITALS — BP 136/97 | HR 88

## 2019-12-17 DIAGNOSIS — F411 Generalized anxiety disorder: Secondary | ICD-10-CM | POA: Diagnosis not present

## 2019-12-17 DIAGNOSIS — L658 Other specified nonscarring hair loss: Secondary | ICD-10-CM

## 2019-12-17 DIAGNOSIS — R7303 Prediabetes: Secondary | ICD-10-CM

## 2019-12-17 DIAGNOSIS — E785 Hyperlipidemia, unspecified: Secondary | ICD-10-CM

## 2019-12-17 DIAGNOSIS — I10 Essential (primary) hypertension: Secondary | ICD-10-CM

## 2019-12-17 LAB — POCT GLYCOSYLATED HEMOGLOBIN (HGB A1C): Hemoglobin A1C: 6.2 % — AB (ref 4.0–5.6)

## 2019-12-17 LAB — GLUCOSE, CAPILLARY: Glucose-Capillary: 103 mg/dL — ABNORMAL HIGH (ref 70–99)

## 2019-12-17 MED ORDER — ATORVASTATIN CALCIUM 10 MG PO TABS: 10.0000 mg | ORAL_TABLET | Freq: Every day | ORAL | 11 refills | Status: DC

## 2019-12-17 MED ORDER — AMLODIPINE BESYLATE 10 MG PO TABS: 10.0000 mg | ORAL_TABLET | Freq: Every day | ORAL | 11 refills | Status: DC

## 2019-12-17 NOTE — Assessment & Plan Note (Signed)
Patient want ot try a different statin

## 2019-12-17 NOTE — Patient Instructions (Signed)
Thank you, Amber [name] for allowing [place] to provide your care today. Today we discussed Anxiety, Alopecia, Diabetes.    I have ordered the following labs for you:   Lab Orders     BMP8+Anion Gap     POC Hbg A1C   I will call if any are abnormal. All of your labs can be accessed through "My Chart".  I have place a referrals to none. Keep appointment with Dermatology.  I have ordered the following tests: none   I have ordered the following medication/changed the following medications:  1. I will work on getting approval for weight loss and diabetes medicaiton  2. Start taking Amlodipine 10 mg daily  3. Atorvastatin 10 mg daily 4. Monoxidil 2% gel 1-2 times daily  Please follow-up in October.  Should you have any questions or concerns please call the internal medicine clinic at [phone].    Dellia Cloud, D.O. [facility]   My Chart Access: [email or web address]   If you have not already done so, please get your COVID 19 vaccine  To schedule an appointment for a COVID vaccine choice any of the following: Go to [email or web address]   Go to [email or web address]                  Call [phone]                                     Call [phone] and select Option 2

## 2019-12-17 NOTE — Progress Notes (Signed)
CC: Prediabetes  HPI:  [name] is a 49 y.o. female with a past medical history stated below and presents today for prediabetes. Please see problem based assessment and plan for additional details.  Past Medical History:  Diagnosis Date   Anxiety    self reported   Asthma    Depression    controlled   Hyperlipidemia    controlled with medication   IBS (irritable bowel syndrome)    Stroke (HCC) 03-13-2015    Current Outpatient Medications on File Prior to Visit  Medication Sig Dispense Refill   Ascorbic Acid (VITAMIN C) 1000 MG tablet Take 1,000 mg by mouth daily.     aspirin EC 81 MG tablet Take 81 mg by mouth daily.     cholecalciferol (VITAMIN D3) 25 MCG (1000 UT) tablet Take 1,000 Units by mouth daily.     cyclobenzaprine (FEXMID) 7.5 MG tablet Take 1 tablet (7.5 mg total) by mouth 3 (three) times daily as needed for muscle spasms. 21 tablet 0   diclofenac sodium (VOLTAREN) 1 % GEL Apply 2-4 g topically 4 (four) times daily. 100 g 3   diphenhydrAMINE-Phenylephrine 25-10 MG TABS Take 1 tablet by mouth every 4 (four) hours as needed. Max dose (6 tablets) 24 tablet 0   Elderberry 575 MG/5ML SYRP Take 2 mg/mL by mouth.     linaclotide (LINZESS) 72 MCG capsule Take 1 capsule (72 mcg total) by mouth daily before breakfast. (Patient not taking: Reported on 12/25/2018) 30 capsule 10   meloxicam (MOBIC) 15 MG tablet Take 1 tablet (15 mg total) by mouth daily. (Patient not taking: Reported on 12/25/2018) 14 tablet 0   Multiple Vitamins-Minerals (MULTIVITAMIN WITH MINERALS) tablet Take 1 tablet by mouth daily.     pantoprazole (PROTONIX) 40 MG tablet Take 1 tablet (40 mg total) by mouth daily. 90 tablet 1   No current facility-administered medications on file prior to visit.    Family History  Problem Relation Age of Onset   Arthritis Father    Diabetes Father    Hypertension Father    Hyperlipidemia Father    Prostate cancer Father     Hyperlipidemia Mother    Stroke Paternal Grandmother    Heart attack Maternal Grandfather    Hyperlipidemia Maternal Grandfather    Prostate cancer Maternal Grandfather    Heart attack Paternal Grandfather    Prostate cancer Maternal Uncle        x 3   Colon cancer Neg Hx    Colon polyps Neg Hx    Stomach cancer Neg Hx    Esophageal cancer Neg Hx    Pancreatic cancer Neg Hx     Social History   Socioeconomic History   Marital status: Married    Spouse name: Not on file   Number of children: Not on file   Years of education: Not on file   Highest education level: Not on file  Occupational History   Not on file  Tobacco Use   Smoking status: Never Smoker   Smokeless tobacco: Never Used  Vaping Use   Vaping Use: Never used  Substance and Sexual Activity   Alcohol use: Yes    Alcohol/week: 7.0 standard drinks    Types: 7 Glasses of wine per week   Drug use: Yes    Types: Marijuana    Comment: 2x/month   Sexual activity: Not on file  Other Topics Concern   Not on file  Social History Narrative   Originally from [place]   Family lives up here in [place]   Has one daughter.   Enjoys spending time shopping and spending time with family    Social Determinants of Health   Financial Resource Strain:    Difficulty of Paying Living Expenses:   Food Insecurity:    Worried About Programme researcher, broadcasting/film/video in the Last Year:    Barista in the Last Year:   Transportation Needs:    Freight forwarder (Medical):    Lack of Transportation (Non-Medical):   Physical Activity:    Days of Exercise per Week:    Minutes of Exercise per Session:   Stress:    Feeling of Stress :   Social Connections:    Frequency of Communication with Friends and Family:    Frequency of Social Gatherings with Friends and Family:    Attends Religious Services:    Active Member of Clubs or Organizations:    Attends Hospital doctor:    Marital Status:   Intimate Partner Violence:    Fear of Current or Ex-Partner:    Emotionally Abused:    Physically Abused:    Sexually Abused:     Review of Systems: ROS negative except for what is noted on the assessment and plan.  Vitals:   12/17/19 1034  BP: (!) 136/97  Pulse: 88  SpO2: 100%     Physical Exam: Physical Exam Constitutional:      Appearance: Normal appearance.  HENT:     Head: Normocephalic and atraumatic.  Cardiovascular:     Rate and Rhythm: Normal rate.     Pulses: Normal pulses.     Heart sounds: Normal heart sounds.  Pulmonary:     Effort: Pulmonary effort is normal.     Breath sounds: Normal breath sounds.  Musculoskeletal:        General: Normal range of motion.     Cervical back: Normal range of motion.     Right lower leg: No edema.     Left lower leg: No edema.  Skin:    General: Skin is warm and dry.  Neurological:     Mental Status: She is alert and oriented to person, place, and time. Mental status is at baseline.  Psychiatric:        Mood and Affect: Mood normal.      Assessment & Plan:   See Encounters Tab for problem based charting.  Patient discussed with Dr. Dr. Janeece Agee, D.O. [facility], PGY-1 Pager: [phone], Phone: [phone] Date 12/18/2019 Time 7:42 AM

## 2019-12-18 ENCOUNTER — Other Ambulatory Visit: Payer: Self-pay

## 2019-12-18 ENCOUNTER — Telehealth: Payer: Self-pay

## 2019-12-18 ENCOUNTER — Ambulatory Visit (INDEPENDENT_AMBULATORY_CARE_PROVIDER_SITE_OTHER): Payer: Medicaid Other | Admitting: Licensed Clinical Social Worker

## 2019-12-18 ENCOUNTER — Encounter: Payer: Self-pay | Admitting: Internal Medicine

## 2019-12-18 ENCOUNTER — Encounter: Payer: Self-pay | Admitting: Licensed Clinical Social Worker

## 2019-12-18 DIAGNOSIS — F419 Anxiety disorder, unspecified: Secondary | ICD-10-CM

## 2019-12-18 LAB — BMP8+ANION GAP
Anion Gap: 15 mmol/L (ref 10.0–18.0)
BUN/Creatinine Ratio: 14 (ref 9–23)
BUN: 12 mg/dL (ref 6–24)
CO2: 23 mmol/L (ref 20–29)
Calcium: 9.6 mg/dL (ref 8.7–10.2)
Chloride: 105 mmol/L (ref 96–106)
Creatinine, Ser: 0.88 mg/dL (ref 0.57–1.00)
GFR calc Af Amer: 90 mL/min/{1.73_m2} (ref >59)
GFR calc non Af Amer: 78 mL/min/{1.73_m2} (ref >59)
Glucose: 94 mg/dL (ref 65–99)
Potassium: 4.5 mmol/L (ref 3.5–5.2)
Sodium: 143 mmol/L (ref 134–144)

## 2019-12-18 NOTE — Assessment & Plan Note (Addendum)
Patient has not been taking her HCTZ due to concerned that her hydrochlorothiazide may be contributing to her hair loss. She states that she would like to try a different antihypertensive medication at this time.    Plan: - Discontinue HCTZ - Start Amlodipine 10 mg

## 2019-12-18 NOTE — Telephone Encounter (Signed)
Requesting to speak with a nurse about test results, please call pt back.  ?

## 2019-12-18 NOTE — Assessment & Plan Note (Addendum)
Patient has a history of prediabetes with most recent A1c of 6.2. She denies any sign or symptoms of diabetes including polyuria, polydipsia, fatigue, or dizziness. She denies any change in sensation of her feet.   Patient is interested in GLP-1a for weight loss and blood sugar controlled. I told her we would likely need a prior authorization as she has not tried any other 1st line treatment for DM.   It appears to Semiglutide is not currently approved for obesity management yet. Therefore, she will need to be counseled on the use of daily subcutaneous injection of Liraglutide. This may be a limiting factor for her.   Plan: - Continue to monitor prediabetes  - Follow up on desire to start Liraglutide.

## 2019-12-18 NOTE — BH Specialist Note (Signed)
Integrated Behavioral Health Visit via Telemedicine (Telephone)  12/18/2019 Amber [name] [id number]   Session Start time: 1:30  Session End time: 2:00 Total time: 30 minutes  Referring Provider: Dr. Marchia Bond Type of Visit: Telephonic Patient location: Home [facility] Provider location: Office All persons participating in visit: Patient and [facility]  Confirmed patient's address: Yes  Confirmed patient's phone number: Yes  Any changes to demographics: No   Discussed confidentiality: Yes    The following statements were read to the patient and/or legal guardian that are established with the [facility] Provider.  "The purpose of this phone visit is to provide behavioral health care while limiting exposure to the coronavirus (COVID19).  There is a possibility of technology failure and discussed alternative modes of communication if that failure occurs."  "By engaging in this telephone visit, you consent to the provision of healthcare.  Additionally, you authorize for your insurance to be billed for the services provided during this telephone visit."   Patient and/or legal guardian consented to telephone visit: Yes   PRESENTING CONCERNS: Patient and/or family reports the following symptoms/concerns: anxiety, agitation, stressful job, family stressors, financial challenges, and health issues. Duration of problem: recurrent for man years; Severity of problem: moderate  GOALS ADDRESSED: Patient will: 1.  Reduce symptoms of: agitation, anxiety, depression and stress  2.  Increase knowledge and/or ability of: coping skills, healthy habits and stress reduction  3.  Demonstrate ability to: Increase healthy adjustment to current life circumstances and Increase adequate support systems for patient/family  INTERVENTIONS: Interventions utilized:  Motivational Interviewing, Brief CBT and Supportive Counseling Standardized Assessments completed: Not Needed  ASSESSMENT: Patient currently  experiencing moderate levels of anxiety and depression. Patient processed interpersonal issues. Patient has conflict with both of her parents. Patient is continuing to have challenges with her health. Patient was more agitated than normal due to stress. Patient reported that due to covid, she was unable to work her normal hours. This caused financial strain on the patient. Patient is looking to find a different place of employment due to a need for insurance.   Patient may benefit from counseling.  PLAN: 1. Follow up with behavioral health clinician on : three weeks.   Lysle Rubens, [facility], LCAS

## 2019-12-18 NOTE — Telephone Encounter (Signed)
I called patient and discussed the lab results with her.

## 2019-12-18 NOTE — Assessment & Plan Note (Signed)
Patient believes that her statin medication may be contributing to her hair loss. She is agreeable to starting a different statin today.   Plan: - Start Atorvastatin 10 mg daily - Repeat Kidney and liver function at next visit.

## 2019-12-18 NOTE — Assessment & Plan Note (Signed)
Patient states that she has an appointment with Lysle Rubens tomorrow for anxiety.

## 2019-12-19 NOTE — Progress Notes (Signed)
Internal Medicine Clinic Attending  Case discussed with Dr. Coe  At the time of the visit.  We reviewed the resident's history and exam and pertinent patient test results.  I agree with the assessment, diagnosis, and plan of care documented in the resident's note.  

## 2019-12-27 ENCOUNTER — Telehealth: Payer: Self-pay

## 2019-12-27 NOTE — Telephone Encounter (Signed)
Called pt back regarding Schedule A form, pt is requesting a letter from the doctor stating she is able to work and function. 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 They are requesting this letter to be written for her and it's not a form where the doctor needs to complete. Please call pt back.

## 2019-12-27 NOTE — Telephone Encounter (Signed)
Pt needs a schedule A form fill out pls contact [phone]

## 2019-12-28 NOTE — Telephone Encounter (Signed)
Pls contact pt regarding cough [phone]

## 2019-12-28 NOTE — Telephone Encounter (Signed)
Return pt's call about cough. Stated she woke up this am with a raspy cough. Denies fever; stated cough is non-productive. Stated she will try OTC meds first , also I instructed to rest and drink plenty of fluids. And to call back on Monday if she's not feeling better. Stated she will.

## 2020-01-02 DIAGNOSIS — L659 Nonscarring hair loss, unspecified: Secondary | ICD-10-CM | POA: Diagnosis not present

## 2020-01-08 ENCOUNTER — Encounter: Payer: Medicaid Other | Admitting: Student

## 2020-01-09 ENCOUNTER — Ambulatory Visit (INDEPENDENT_AMBULATORY_CARE_PROVIDER_SITE_OTHER): Payer: Medicaid Other | Admitting: Licensed Clinical Social Worker

## 2020-01-09 ENCOUNTER — Other Ambulatory Visit: Payer: Self-pay

## 2020-01-09 ENCOUNTER — Encounter: Payer: Self-pay | Admitting: Licensed Clinical Social Worker

## 2020-01-09 DIAGNOSIS — F411 Generalized anxiety disorder: Secondary | ICD-10-CM

## 2020-01-09 NOTE — BH Specialist Note (Signed)
Integrated Behavioral Health Visit via Telemedicine (Telephone)  01/09/2020 Amber [name] [id number]   Session Start time: 11;48  Session End time: 12:08 Total time: 20 minutes  Referring Provider: Dr. Marlyce Huge Type of Visit: Telephonic Patient location: Home [facility] Provider location: Remote All persons participating in visit: Patient and [facility]  Confirmed patient's address: Yes  Confirmed patient's phone number: Yes  Any changes to demographics: No   The following statements were read to the patient and/or legal guardian that are established with the [facility] Provider.  "The purpose of this phone visit is to provide behavioral health care while limiting exposure to the coronavirus (COVID19).  There is a possibility of technology failure and discussed alternative modes of communication if that failure occurs."  Discussed confidentiality: Yes   "By engaging in this telephone visit, you consent to the provision of healthcare.  Additionally, you authorize for your insurance to be billed for the services provided during this telephone visit."   Patient and/or legal guardian consented to telephone visit: Yes   PRESENTING CONCERNS: Patient and/or family reports the following symptoms/concerns: anxiety, agitation, stressful job, family stressors, financial challenges, and health issues. Duration of problem: recurrent for many years; Severity of problem: moderate  STRENGTHS (Protective Factors/Coping Skills): Exercising more.  GOALS ADDRESSED: Patient will: 1.  Reduce symptoms of: agitation, anxiety and stress  2.  Increase knowledge and/or ability of: coping skills, healthy habits and stress reduction  3.  Demonstrate ability to: Increase healthy adjustment to current life circumstances and Increase adequate support systems for patient/family  INTERVENTIONS: Interventions utilized:  Mindfulness or Relaxation Training, Brief CBT and Supportive Counseling Standardized Assessments  completed: Not Needed  ASSESSMENT: Patient currently experiencing mild to moderate levels of anxiety. Patient reported she is trying to "focus on what she can control". Patient is still trying to find a new place of employment, and she has not found a new job yet. Patient's job continues to be a stressor. Patient reported that she has started losing more hair since our last session, and processed how that has impacted her.   Patient processed family interactions, and how she is trying to limit her time spent with particular family members for her healthy boundaries. Patient is also trying to exercise and create more balance in her life.   Patient may benefit from counseling.  PLAN: 1. Follow up with behavioral health clinician on : one month 2.   Lysle Rubens, [facility], LCAS

## 2020-01-23 ENCOUNTER — Other Ambulatory Visit: Payer: Self-pay

## 2020-01-23 ENCOUNTER — Ambulatory Visit (INDEPENDENT_AMBULATORY_CARE_PROVIDER_SITE_OTHER): Payer: Medicaid Other | Admitting: Student

## 2020-01-23 ENCOUNTER — Encounter: Payer: Self-pay | Admitting: Student

## 2020-01-23 DIAGNOSIS — R7303 Prediabetes: Secondary | ICD-10-CM | POA: Diagnosis not present

## 2020-01-23 DIAGNOSIS — K219 Gastro-esophageal reflux disease without esophagitis: Secondary | ICD-10-CM

## 2020-01-23 DIAGNOSIS — F419 Anxiety disorder, unspecified: Secondary | ICD-10-CM | POA: Diagnosis not present

## 2020-01-23 DIAGNOSIS — E785 Hyperlipidemia, unspecified: Secondary | ICD-10-CM

## 2020-01-23 DIAGNOSIS — I10 Essential (primary) hypertension: Secondary | ICD-10-CM

## 2020-01-23 MED ORDER — PANTOPRAZOLE SODIUM 40 MG PO TBEC: 40.0000 mg | DELAYED_RELEASE_TABLET | Freq: Every day | ORAL | 1 refills | Status: DC

## 2020-01-23 NOTE — Patient Instructions (Signed)
Amber [name],  It was a pleasure seeing you in the clinic.   As we discussed, please continue exercising (at least 150 minutes per week) and incorporate a healthier diet (attached) into your lifestyle.   I will schedule a lab visit in 2 months to recheck your hemoglobin A1c.  Please follow up in 6 months.   Diabetes Mellitus and Nutrition, Adult When you have diabetes (diabetes mellitus), it is very important to have healthy eating habits because your blood sugar (glucose) levels are greatly affected by what you eat and drink. Eating healthy foods in the appropriate amounts, at about the same times every day, can help you:  Control your blood glucose.  Lower your risk of heart disease.  Improve your blood pressure.  Reach or maintain a healthy weight. Every person with diabetes is different, and each person has different needs for a meal plan. Your health care provider may recommend that you work with a diet and nutrition specialist (dietitian) to make a meal plan that is best for you. Your meal plan may vary depending on factors such as:  The calories you need.  The medicines you take.  Your weight.  Your blood glucose, blood pressure, and cholesterol levels.  Your activity level.  Other health conditions you have, such as heart or kidney disease. How do carbohydrates affect me? Carbohydrates, also called carbs, affect your blood glucose level more than any other type of food. Eating carbs naturally raises the amount of glucose in your blood. Carb counting is a method for keeping track of how many carbs you eat. Counting carbs is important to keep your blood glucose at a healthy level, especially if you use insulin or take certain oral diabetes medicines. It is important to know how many carbs you can safely have in each meal. This is different for every person. Your dietitian can help you calculate how many carbs you should have at each meal and for each snack. Foods that  contain carbs include:  Bread, cereal, rice, pasta, and crackers.  Potatoes and corn.  Peas, beans, and lentils.  Milk and yogurt.  Fruit and juice.  Desserts, such as cakes, cookies, ice cream, and candy. How does alcohol affect me? Alcohol can cause a sudden decrease in blood glucose (hypoglycemia), especially if you use insulin or take certain oral diabetes medicines. Hypoglycemia can be a life-threatening condition. Symptoms of hypoglycemia (sleepiness, dizziness, and confusion) are similar to symptoms of having too much alcohol. If your health care provider says that alcohol is safe for you, follow these guidelines:  Limit alcohol intake to no more than 1 drink per day for nonpregnant women and 2 drinks per day for men. One drink equals 12 oz of beer, 5 oz of wine, or 1 oz of hard liquor.  Do not drink on an empty stomach.  Keep yourself hydrated with water, diet soda, or unsweetened iced tea.  Keep in mind that regular soda, juice, and other mixers may contain a lot of sugar and must be counted as carbs. What are tips for following this plan?  Reading food labels  Start by checking the serving size on the "Nutrition Facts" label of packaged foods and drinks. The amount of calories, carbs, fats, and other nutrients listed on the label is based on one serving of the item. Many items contain more than one serving per package.  Check the total grams (g) of carbs in one serving. You can calculate the number of servings of carbs in  one serving by dividing the total carbs by 15. For example, if a food has 30 g of total carbs, it would be equal to 2 servings of carbs.  Check the number of grams (g) of saturated and trans fats in one serving. Choose foods that have low or no amount of these fats.  Check the number of milligrams (mg) of salt (sodium) in one serving. Most people should limit total sodium intake to less than 2,300 mg per day.  Always check the nutrition information of  foods labeled as "low-fat" or "nonfat". These foods may be higher in added sugar or refined carbs and should be avoided.  Talk to your dietitian to identify your daily goals for nutrients listed on the label. Shopping  Avoid buying canned, premade, or processed foods. These foods tend to be high in fat, sodium, and added sugar.  Shop around the outside edge of the grocery store. This includes fresh fruits and vegetables, bulk grains, fresh meats, and fresh dairy. Cooking  Use low-heat cooking methods, such as baking, instead of high-heat cooking methods like deep frying.  Cook using healthy oils, such as olive, canola, or sunflower oil.  Avoid cooking with butter, cream, or high-fat meats. Meal planning  Eat meals and snacks regularly, preferably at the same times every day. Avoid going long periods of time without eating.  Eat foods high in fiber, such as fresh fruits, vegetables, beans, and whole grains. Talk to your dietitian about how many servings of carbs you can eat at each meal.  Eat 4-6 ounces (oz) of lean protein each day, such as lean meat, chicken, fish, eggs, or tofu. One oz of lean protein is equal to: ? 1 oz of meat, chicken, or fish. ? 1 egg. ?  cup of tofu.  Eat some foods each day that contain healthy fats, such as avocado, nuts, seeds, and fish. Lifestyle  Check your blood glucose regularly.  Exercise regularly as told by your health care provider. This may include: ? 150 minutes of moderate-intensity or vigorous-intensity exercise each week. This could be brisk walking, biking, or water aerobics. ? Stretching and doing strength exercises, such as yoga or weightlifting, at least 2 times a week.  Take medicines as told by your health care provider.  Do not use any products that contain nicotine or tobacco, such as cigarettes and e-cigarettes. If you need help quitting, ask your health care provider.  Work with a Social worker or diabetes educator to identify  strategies to manage stress and any emotional and social challenges. Questions to ask a health care provider  Do I need to meet with a diabetes educator?  Do I need to meet with a dietitian?  What number can I call if I have questions?  When are the best times to check my blood glucose? Where to find more information:  American Diabetes Association: diabetes.org  Academy of Nutrition and Dietetics: www.eatright.[employer] of Diabetes and Digestive and Kidney Diseases (NIH): [employer] Summary  A healthy meal plan will help you control your blood glucose and maintain a healthy lifestyle.  Working with a diet and nutrition specialist (dietitian) can help you make a meal plan that is best for you.  Keep in mind that carbohydrates (carbs) and alcohol have immediate effects on your blood glucose levels. It is important to count carbs and to use alcohol carefully. This information is not intended to replace advice given to you by your health care provider. Make sure you discuss any  questions you have with your health care provider. Document Revised: 04/29/2017 Document Reviewed: 06/21/2016 Elsevier Patient Education  2020 [employer].

## 2020-01-23 NOTE — Progress Notes (Signed)
CC: follow up visit, meet new PCP  HPI:  [name] is a 50 y.o. female with history listed below who presented to the clinic for follow up visit and to meet new PCP. Please see individualized A/P for full HPI.  Past Medical History:  Diagnosis Date  . Anxiety    self reported  . Asthma   . Depression    controlled  . Hyperlipidemia    controlled with medication  . IBS (irritable bowel syndrome)   . Stroke [facility]) 03-13-2015   Current Outpatient Medications on File Prior to Visit  Medication Sig Dispense Refill  . amLODipine (NORVASC) 10 MG tablet Take 1 tablet (10 mg total) by mouth daily. 30 tablet 11  . Ascorbic Acid (VITAMIN C) 1000 MG tablet Take 1,000 mg by mouth daily.    Marland Kitchen aspirin EC 81 MG tablet Take 81 mg by mouth daily.    Marland Kitchen atorvastatin (LIPITOR) 10 MG tablet Take 1 tablet (10 mg total) by mouth daily. 30 tablet 11  . cholecalciferol (VITAMIN D3) 25 MCG (1000 UT) tablet Take 1,000 Units by mouth daily.    Lucila Maine 575 MG/5ML SYRP Take 2 mg/mL by mouth.    . Multiple Vitamins-Minerals (MULTIVITAMIN WITH MINERALS) tablet Take 1 tablet by mouth daily.     No current facility-administered medications on file prior to visit.   Allergies  Allergen Reactions  . Keflex [Cephalexin] Rash     Family History  Problem Relation Age of Onset  . Arthritis Father   . Diabetes Father   . Hypertension Father   . Hyperlipidemia Father   . Prostate cancer Father   . Hyperlipidemia Mother   . Stroke Paternal Grandmother   . Heart attack Maternal Grandfather   . Hyperlipidemia Maternal Grandfather   . Prostate cancer Maternal Grandfather   . Heart attack Paternal Grandfather   . Prostate cancer Maternal Uncle        x 3  . Colon cancer Neg Hx   . Colon polyps Neg Hx   . Stomach cancer Neg Hx   . Esophageal cancer Neg Hx   . Pancreatic cancer Neg Hx    Social History   Socioeconomic History  . Marital status: Married    Spouse name: Not on file  .  Number of children: Not on file  . Years of education: Not on file  . Highest education level: Not on file  Occupational History  . Not on file  Tobacco Use  . Smoking status: Never Smoker  . Smokeless tobacco: Never Used  Vaping Use  . Vaping Use: Never used  Substance and Sexual Activity  . Alcohol use: Yes    Alcohol/week: 7.0 standard drinks    Types: 7 Glasses of wine per week  . Drug use: Yes    Types: Marijuana    Comment: 2x/month  . Sexual activity: Not on file  Other Topics Concern  . Not on file  Social History Narrative   Originally from [place]   Family lives up here in [place]   Has one daughter.   Enjoys spending time shopping and spending time with family    Social Determinants of Health   Financial Resource Strain:   . Difficulty of Paying Living Expenses: Not on file  Food Insecurity:   . Worried About Programme researcher, broadcasting/film/video in the Last Year: Not on file  . Ran Out of Food in the Last Year: Not on file  Transportation Needs:   .  Lack of Transportation (Medical): Not on file  . Lack of Transportation (Non-Medical): Not on file  Physical Activity:   . Days of Exercise per Week: Not on file  . Minutes of Exercise per Session: Not on file  Stress:   . Feeling of Stress : Not on file  Social Connections:   . Frequency of Communication with Friends and Family: Not on file  . Frequency of Social Gatherings with Friends and Family: Not on file  . Attends Religious Services: Not on file  . Active Member of Clubs or Organizations: Not on file  . Attends Banker Meetings: Not on file  . Marital Status: Not on file  Intimate Partner Violence:   . Fear of Current or Ex-Partner: Not on file  . Emotionally Abused: Not on file  . Physically Abused: Not on file  . Sexually Abused: Not on file    Review of Systems:   ROS is negative aside from that listed in individualized A/P.  Physical Exam:  Vitals:   01/23/20 1003  BP: 121/84    Pulse: (!) 104  SpO2: 98%  Weight: 184 lb 12.8 oz (83.8 kg)   Physical Exam Constitutional:      Appearance: She is obese.  HENT:     Head: Normocephalic and atraumatic.  Eyes:     Extraocular Movements: Extraocular movements intact.     Conjunctiva/sclera: Conjunctivae normal.     Pupils: Pupils are equal, round, and reactive to light.  Cardiovascular:     Rate and Rhythm: Normal rate and regular rhythm.     Heart sounds: Normal heart sounds. No murmur heard.  No friction rub. No gallop.   Pulmonary:     Effort: Pulmonary effort is normal.     Breath sounds: Normal breath sounds. No wheezing, rhonchi or rales.  Abdominal:     General: Bowel sounds are normal. There is no distension.     Palpations: Abdomen is soft.     Tenderness: There is no abdominal tenderness. There is no guarding or rebound.  Musculoskeletal:        General: No swelling. Normal range of motion.  Skin:    General: Skin is warm and dry.  Neurological:     General: No focal deficit present.     Mental Status: She is alert and oriented to person, place, and time.  Psychiatric:        Mood and Affect: Mood normal.        Behavior: Behavior normal.        Thought Content: Thought content normal.      Assessment & Plan:   See Encounters Tab for problem based charting.  Patient seen with Dr. Sandre Kitty

## 2020-01-23 NOTE — Assessment & Plan Note (Signed)
Patient with history of anxiety. She has been meeting with Lysle Rubens and reports that it has been very helpful for her. 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 Reports feeling a lot better now compared to last visit. Scheduled to meet [facility] in 2 weeks.

## 2020-01-23 NOTE — Assessment & Plan Note (Addendum)
Patient with history of GERD. Refilled patient's protonix today.

## 2020-01-23 NOTE — Assessment & Plan Note (Signed)
Patient with history of HLD, now on lipitor 10mg  (started last month, tolerating well). Was on crestor before, but switched due to concern that medication was causing hair loss.   -Continue lipitor 10mg 

## 2020-01-23 NOTE — Assessment & Plan Note (Signed)
Patient with A1c 6.2 last month. Reports that she has been walking more often now and has been trying to eat healthier. Discussed benefits of diabetic diet. Also recommended seeing Lupita Leash, however patient states that she will try diabetic diet first and if no improvement then will consider seeing Lupita Leash at that time. Also educated on benefits of exercise (>150 minutes/week).  -Incorporate diabetic diet and exercise -Recheck HbA1c in 2 months

## 2020-01-23 NOTE — Assessment & Plan Note (Signed)
Patient with history of HTN now on amlodipine 10mg  (started last month, tolerating well). BP today 121/84. No need for any changes at this time.   -Continue amlodipine 10mg 

## 2020-01-25 DIAGNOSIS — H5213 Myopia, bilateral: Secondary | ICD-10-CM | POA: Diagnosis not present

## 2020-01-25 NOTE — Progress Notes (Signed)
Internal Medicine Clinic Attending  I saw and evaluated the patient.  I personally confirmed the key portions of the history and exam documented by Dr. Jinwala and I reviewed pertinent patient test results.  The assessment, diagnosis, and plan were formulated together and I agree with the documentation in the resident's note.  Lanasia Porras, M.D., Ph.D.  

## 2020-02-06 ENCOUNTER — Ambulatory Visit: Payer: Medicaid Other | Admitting: Licensed Clinical Social Worker

## 2020-02-07 DIAGNOSIS — H5203 Hypermetropia, bilateral: Secondary | ICD-10-CM | POA: Diagnosis not present

## 2020-02-15 DIAGNOSIS — R609 Edema, unspecified: Secondary | ICD-10-CM | POA: Diagnosis not present

## 2020-02-15 DIAGNOSIS — Z8679 Personal history of other diseases of the circulatory system: Secondary | ICD-10-CM | POA: Diagnosis not present

## 2020-02-15 DIAGNOSIS — I10 Essential (primary) hypertension: Secondary | ICD-10-CM | POA: Diagnosis not present

## 2020-03-03 ENCOUNTER — Telehealth: Payer: Self-pay

## 2020-03-03 NOTE — Telephone Encounter (Signed)
Yes, okay to reestablish 

## 2020-03-03 NOTE — Telephone Encounter (Signed)
Patient was seen in 2018. Has been over three years. She has been seen at residency clinic but now how new insurance and would like to come back to see Jae Dire. Ok to make appointment.

## 2020-03-04 NOTE — Telephone Encounter (Signed)
Called patient to schedule appointment to re-establish. LVM to call back.

## 2020-03-06 NOTE — Telephone Encounter (Signed)
Viewed schedule for Amber [name] to schedule. Patient already scheduled for re-established appointment.

## 2020-03-14 ENCOUNTER — Encounter: Payer: Self-pay | Admitting: Primary Care

## 2020-03-14 ENCOUNTER — Other Ambulatory Visit: Payer: Self-pay

## 2020-03-14 ENCOUNTER — Ambulatory Visit: Payer: Medicaid Other | Admitting: Primary Care

## 2020-03-14 ENCOUNTER — Ambulatory Visit (INDEPENDENT_AMBULATORY_CARE_PROVIDER_SITE_OTHER): Payer: Medicaid Other | Admitting: Primary Care

## 2020-03-14 VITALS — BP 120/82 | HR 83 | Temp 98.3°F | Ht 62.0 in | Wt 180.0 lb

## 2020-03-14 DIAGNOSIS — L658 Other specified nonscarring hair loss: Secondary | ICD-10-CM | POA: Diagnosis not present

## 2020-03-14 DIAGNOSIS — R7303 Prediabetes: Secondary | ICD-10-CM | POA: Diagnosis not present

## 2020-03-14 DIAGNOSIS — F411 Generalized anxiety disorder: Secondary | ICD-10-CM

## 2020-03-14 DIAGNOSIS — I10 Essential (primary) hypertension: Secondary | ICD-10-CM | POA: Diagnosis not present

## 2020-03-14 DIAGNOSIS — K219 Gastro-esophageal reflux disease without esophagitis: Secondary | ICD-10-CM

## 2020-03-14 DIAGNOSIS — Z8673 Personal history of transient ischemic attack (TIA), and cerebral infarction without residual deficits: Secondary | ICD-10-CM | POA: Diagnosis not present

## 2020-03-14 DIAGNOSIS — E785 Hyperlipidemia, unspecified: Secondary | ICD-10-CM

## 2020-03-14 LAB — LIPID PANEL
Cholesterol: 202 mg/dL — ABNORMAL HIGH (ref 0–200)
HDL: 78 mg/dL (ref >39.00)
LDL Cholesterol: 109 mg/dL — ABNORMAL HIGH (ref 0–99)
NonHDL: 123.61
Total CHOL/HDL Ratio: 3
Triglycerides: 74 mg/dL (ref 0.0–149.0)
VLDL: 14.8 mg/dL (ref 0.0–40.0)

## 2020-03-14 LAB — COMPREHENSIVE METABOLIC PANEL
ALT: 17 U/L (ref 0–35)
AST: 15 U/L (ref 0–37)
Albumin: 4.4 g/dL (ref 3.5–5.2)
Alkaline Phosphatase: 85 U/L (ref 39–117)
BUN: 13 mg/dL (ref 6–23)
CO2: 33 mEq/L — ABNORMAL HIGH (ref 19–32)
Calcium: 9.9 mg/dL (ref 8.4–10.5)
Chloride: 101 mEq/L (ref 96–112)
Creatinine, Ser: 0.99 mg/dL (ref 0.40–1.20)
GFR: 67.63 mL/min (ref >60.00)
Glucose, Bld: 81 mg/dL (ref 70–99)
Potassium: 3.5 mEq/L (ref 3.5–5.1)
Sodium: 140 mEq/L (ref 135–145)
Total Bilirubin: 0.4 mg/dL (ref 0.2–1.2)
Total Protein: 8.1 g/dL (ref 6.0–8.3)

## 2020-03-14 LAB — HEMOGLOBIN A1C: Hgb A1c MFr Bld: 6.6 % — ABNORMAL HIGH (ref 4.6–6.5)

## 2020-03-14 MED ORDER — HYDROCHLOROTHIAZIDE 12.5 MG PO TABS: 12.5000 mg | ORAL_TABLET | Freq: Every day | ORAL | 3 refills | Status: DC

## 2020-03-14 NOTE — Patient Instructions (Signed)
Hold your amlodipine 10 mg tablets for blood pressure for two weeks.  Monitor your blood pressure, it should be below 130/90. Update me on readings and swelling in the feet in 2 weeks as discussed.  Stop by the lab prior to leaving today. I will notify you of your results once received.   It was a pleasure to see you today! Welcome back!

## 2020-03-14 NOTE — Progress Notes (Signed)
Subjective:    Patient ID: Amber [name], female    DOB: 06/10/1970, 49 y.o.   MRN: [id number]  HPI  This visit occurred during the SARS-CoV-2 public health emergency.  Safety protocols were in place, including screening questions prior to the visit, additional usage of staff PPE, and extensive cleaning of exam room while observing appropriate contact time as indicated for disinfecting solutions.   Amber [name] is a 49 year old female who presents today to re-establish care and discuss the problems mentioned below. Will obtain/review records.  1) Hypertension: Prescribed amlodipine 10 mg and HCTZ 12.5 mg. She denies headaches, dizziness, chest pain.   BP Readings from Last 3 Encounters:  03/14/20 120/82  01/23/20 121/84  12/17/19 (!) 136/97     2) Cryptogenic Stroke: Diagnosed in 2016. Managed on atorvastatin 10 mg, previously managed on Crestor which was removed due to concerns of potential hair loss.   She is feeling well, is able to articulate words well, speaks in complete sentences without difficulty.   3) GAD: Previously following with therapy for whom she saw twice monthly. Overall she feels well managed with therapy, will need a local therapist in her network.   4) Prediabetes: Last A1C of 6.2 in July 2021, also same value of 6.2 in October 2020. She has a strong family history of diabetes. She has been working on her diet and increasing activity.   Wt Readings from Last 3 Encounters:  03/14/20 180 lb (81.6 kg)  01/23/20 184 lb 12.8 oz (83.8 kg)  12/10/19 187 lb 3.2 oz (84.9 kg)     Review of Systems  Eyes: Negative for visual disturbance.  Respiratory: Negative for shortness of breath.   Cardiovascular: Negative for chest pain.  Neurological: Negative for dizziness and headaches.  Psychiatric/Behavioral: The patient is not nervous/anxious.        Past Medical History:  Diagnosis Date  . Anxiety    self reported  . Asthma   . Depression    controlled  .  Hyperlipidemia    controlled with medication  . IBS (irritable bowel syndrome)   . Stroke [facility]) 03-13-2015     Social History   Socioeconomic History  . Marital status: Married    Spouse name: Not on file  . Number of children: Not on file  . Years of education: Not on file  . Highest education level: Not on file  Occupational History  . Not on file  Tobacco Use  . Smoking status: Never Smoker  . Smokeless tobacco: Never Used  Vaping Use  . Vaping Use: Never used  Substance and Sexual Activity  . Alcohol use: Yes    Alcohol/week: 7.0 standard drinks    Types: 7 Glasses of wine per week  . Drug use: Yes    Types: Marijuana    Comment: 2x/month  . Sexual activity: Not on file  Other Topics Concern  . Not on file  Social History Narrative   Originally from [place]   Family lives up here in [place]   Has one daughter.   Enjoys spending time shopping and spending time with family    Social Determinants of Health   Financial Resource Strain:   . Difficulty of Paying Living Expenses: Not on file  Food Insecurity:   . Worried About Programme researcher, broadcasting/film/video in the Last Year: Not on file  . Ran Out of Food in the Last Year: Not on file  Transportation Needs:   . Lack of  Transportation (Medical): Not on file  . Lack of Transportation (Non-Medical): Not on file  Physical Activity:   . Days of Exercise per Week: Not on file  . Minutes of Exercise per Session: Not on file  Stress:   . Feeling of Stress : Not on file  Social Connections:   . Frequency of Communication with Friends and Family: Not on file  . Frequency of Social Gatherings with Friends and Family: Not on file  . Attends Religious Services: Not on file  . Active Member of Clubs or Organizations: Not on file  . Attends Banker Meetings: Not on file  . Marital Status: Not on file  Intimate Partner Violence:   . Fear of Current or Ex-Partner: Not on file  . Emotionally Abused: Not on file   . Physically Abused: Not on file  . Sexually Abused: Not on file    Past Surgical History:  Procedure Laterality Date  . ABDOMINAL HYSTERECTOMY  10/2006  . bowel reconstruction  10/2006   with hysterectomy  . CESAREAN SECTION  2005  . EP IMPLANTABLE DEVICE N/A 03/17/2015   Procedure: Loop Recorder Insertion;  Surgeon: Will Jorja Loa, MD;  Location: [facility] INVASIVE CV LAB;  Service: Cardiovascular;  Laterality: N/A;  . TEE WITHOUT CARDIOVERSION N/A 03/17/2015   Procedure: TRANSESOPHAGEAL ECHOCARDIOGRAM (TEE);  Surgeon: Chilton Si, MD;  Location: [facility] ENDOSCOPY;  Service: Cardiovascular;  Laterality: N/A;  . TOE SURGERY     Left 2nd metatarsal    Family History  Problem Relation Age of Onset  . Arthritis Father   . Diabetes Father   . Hypertension Father   . Hyperlipidemia Father   . Prostate cancer Father   . Hyperlipidemia Mother   . Stroke Paternal Grandmother   . Heart attack Maternal Grandfather   . Hyperlipidemia Maternal Grandfather   . Prostate cancer Maternal Grandfather   . Heart attack Paternal Grandfather   . Prostate cancer Maternal Uncle        x 3  . Colon cancer Neg Hx   . Colon polyps Neg Hx   . Stomach cancer Neg Hx   . Esophageal cancer Neg Hx   . Pancreatic cancer Neg Hx     Allergies  Allergen Reactions  . Keflex [Cephalexin] Rash    Current Outpatient Medications on File Prior to Visit  Medication Sig Dispense Refill  . amLODipine (NORVASC) 10 MG tablet Take 1 tablet (10 mg total) by mouth daily. 30 tablet 11  . Ascorbic Acid (VITAMIN C) 1000 MG tablet Take 1,000 mg by mouth daily.    Marland Kitchen aspirin EC 81 MG tablet Take 81 mg by mouth daily.    Marland Kitchen atorvastatin (LIPITOR) 10 MG tablet Take 1 tablet (10 mg total) by mouth daily. 30 tablet 11  . cholecalciferol (VITAMIN D3) 25 MCG (1000 UT) tablet Take 1,000 Units by mouth daily.    Lucila Maine 575 MG/5ML SYRP Take 2 mg/mL by mouth.    . hydrochlorothiazide (HYDRODIURIL) 12.5 MG tablet Take 12.5  mg by mouth daily.    . Multiple Vitamins-Minerals (MULTIVITAMIN WITH MINERALS) tablet Take 1 tablet by mouth daily.    . pantoprazole (PROTONIX) 40 MG tablet Take 1 tablet (40 mg total) by mouth daily. 90 tablet 1   No current facility-administered medications on file prior to visit.    BP 120/82   Pulse 83   Temp 98.3 F (36.8 C) (Temporal)   Ht 5\' 2"  (1.575 m)   Wt 180 lb (81.6  kg)   SpO2 96%   BMI 32.92 kg/m    Objective:   Physical Exam Cardiovascular:     Rate and Rhythm: Normal rate and regular rhythm.  Pulmonary:     Effort: Pulmonary effort is normal.     Breath sounds: Normal breath sounds.  Musculoskeletal:     Cervical back: Neck supple.  Skin:    General: Skin is warm and dry.  Psychiatric:        Mood and Affect: Mood normal.            Assessment & Plan:

## 2020-03-14 NOTE — Assessment & Plan Note (Signed)
Chronic with A1C of 6.2 in October 2020 and also in July 2021. Repeat A1C pending.  Discussed the importance of a healthy diet and regular exercise in order for weight loss, and to reduce the risk of any potential medical problems.

## 2020-03-14 NOTE — Assessment & Plan Note (Signed)
Now on atorvastatin 10 mg.  Lipid panel from March 2021 reviewed. Continue current atorvastatin 10 mg.

## 2020-03-14 NOTE — Assessment & Plan Note (Signed)
Using pantoprazole 40 mg PRN, mostly using natural supplements.

## 2020-03-14 NOTE — Assessment & Plan Note (Signed)
Doing well with therapy, will refer to local therapist within network.

## 2020-03-14 NOTE — Assessment & Plan Note (Signed)
Doing very well since her last visit in our office.  No new symptoms since her last visit.   Speaking and articulating words very well. Continue statin therapy.

## 2020-03-14 NOTE — Addendum Note (Signed)
Addended by: Doreene Nest on: 03/14/2020 12:27 PM   Modules accepted: Orders

## 2020-03-14 NOTE — Assessment & Plan Note (Signed)
Well controlled in the office today, but she's noticed ankle and pedal edema since stating amlodipine.   Will continue HCTZ 12.5 mg, stop amlodipine 10 mg. We will have her monitor BP at home, she will update if edema does not improve and/or BP increases. May have to increase HCTZ.

## 2020-03-14 NOTE — Assessment & Plan Note (Signed)
Following with dermatology. Continue current regimen.

## 2020-03-17 ENCOUNTER — Other Ambulatory Visit: Payer: Self-pay

## 2020-03-17 ENCOUNTER — Telehealth: Payer: Self-pay | Admitting: *Deleted

## 2020-03-17 DIAGNOSIS — E785 Hyperlipidemia, unspecified: Secondary | ICD-10-CM

## 2020-03-17 DIAGNOSIS — E119 Type 2 diabetes mellitus without complications: Secondary | ICD-10-CM

## 2020-03-17 MED ORDER — METFORMIN HCL ER 500 MG PO TB24: 500.0000 mg | ORAL_TABLET | Freq: Every day | ORAL | 3 refills | Status: DC

## 2020-03-17 MED ORDER — ATORVASTATIN CALCIUM 20 MG PO TABS: 10.0000 mg | ORAL_TABLET | Freq: Every day | ORAL | 3 refills | Status: DC

## 2020-03-17 NOTE — Telephone Encounter (Signed)
Please thank the patient for getting back to me, I have not had a chance to review her message. Prescription for Metformin XR 500 mg sent to pharmacy.  Take 1 tablet once daily with breakfast.  I would like to see her back in 3 months for diabetes follow-up and A1c/lipidcheck.  Also, I would like to increase her atorvastatin to 20 mg.  Is she okay with this?  If so then please send atorvastatin 20 mg, #90 with 3 refills.

## 2020-03-17 NOTE — Telephone Encounter (Signed)
Noted  

## 2020-03-17 NOTE — Telephone Encounter (Signed)
Patient called in stating that she received a MyChart message that Jae Dire wants her to start Metformin and she agreed with the plan but says [facility] does not have the Rx yet.

## 2020-03-17 NOTE — Telephone Encounter (Signed)
Called patient reviewed all information and repeated back to me. Will call if any questions.  New script for Atorvastatin called in. See will take two of what she has until refill needed. Follow up appointment has been made.

## 2020-03-19 ENCOUNTER — Other Ambulatory Visit: Payer: Self-pay | Admitting: Internal Medicine

## 2020-03-19 ENCOUNTER — Other Ambulatory Visit: Payer: Medicaid Other

## 2020-03-24 DIAGNOSIS — L720 Epidermal cyst: Secondary | ICD-10-CM | POA: Diagnosis not present

## 2020-03-24 DIAGNOSIS — L089 Local infection of the skin and subcutaneous tissue, unspecified: Secondary | ICD-10-CM | POA: Diagnosis not present

## 2020-03-24 DIAGNOSIS — L659 Nonscarring hair loss, unspecified: Secondary | ICD-10-CM | POA: Diagnosis not present

## 2020-03-24 DIAGNOSIS — L668 Other cicatricial alopecia: Secondary | ICD-10-CM | POA: Diagnosis not present

## 2020-03-27 ENCOUNTER — Other Ambulatory Visit: Payer: Self-pay | Admitting: Primary Care

## 2020-03-27 DIAGNOSIS — F411 Generalized anxiety disorder: Secondary | ICD-10-CM

## 2020-04-18 ENCOUNTER — Encounter (INDEPENDENT_AMBULATORY_CARE_PROVIDER_SITE_OTHER): Payer: Self-pay

## 2020-04-21 DIAGNOSIS — L658 Other specified nonscarring hair loss: Secondary | ICD-10-CM | POA: Diagnosis not present

## 2020-04-21 DIAGNOSIS — L089 Local infection of the skin and subcutaneous tissue, unspecified: Secondary | ICD-10-CM | POA: Diagnosis not present

## 2020-04-21 DIAGNOSIS — L669 Cicatricial alopecia, unspecified: Secondary | ICD-10-CM | POA: Diagnosis not present

## 2020-05-07 ENCOUNTER — Telehealth: Payer: Self-pay | Admitting: *Deleted

## 2020-05-07 ENCOUNTER — Telehealth (INDEPENDENT_AMBULATORY_CARE_PROVIDER_SITE_OTHER): Payer: BC Managed Care – PPO | Admitting: Family Medicine

## 2020-05-07 ENCOUNTER — Other Ambulatory Visit: Payer: Self-pay

## 2020-05-07 ENCOUNTER — Encounter: Payer: Self-pay | Admitting: Family Medicine

## 2020-05-07 VITALS — Temp 97.1°F | Ht 62.0 in | Wt 175.0 lb

## 2020-05-07 DIAGNOSIS — U071 COVID-19: Secondary | ICD-10-CM | POA: Diagnosis not present

## 2020-05-07 DIAGNOSIS — R059 Cough, unspecified: Secondary | ICD-10-CM

## 2020-05-07 MED ORDER — ALBUTEROL SULFATE HFA 108 (90 BASE) MCG/ACT IN AERS: 2.0000 | INHALATION_SPRAY | RESPIRATORY_TRACT | 1 refills | Status: DC | PRN

## 2020-05-07 NOTE — Telephone Encounter (Signed)
Patient called stating that she did a home covid test last night and it was positive. Patient stated that she has had a headache, cough and hoarseness. Patient denies a fever, SOB or difficulty breathing. Patient stated that she may be requesting short term disability at work and wants to know if they will accept a home covid test. Advised patient that she will need to discuss this with her employer. Patient was scheduled for a virtual visit with Deboraha Sprang NP today at 2:00 pm. Patient was given ER precations and she verbalized understanding.  Patient has had her coivd vaccines.

## 2020-05-07 NOTE — Progress Notes (Signed)
Virtual Visit via Video Note  I connected with Amber [name] on 05/07/20 at  2:00 PM EST by a video enabled telemedicine application and verified that I am speaking with the correct person using two identifiers.  Unfortunately, was unable to establish a video connection with patient due to connectivity issues so visit was completed with audio only  Location: Patient: In her home Provider: [facility] Persons participating in virtual visit: Patient, provider   I discussed the limitations of evaluation and management by telemedicine and the availability of in person appointments. The patient expressed understanding and agreed to proceed.  History of Present Illness: Chief Complaint  Patient presents with  . Covid Positive    Pt stated that she took 2 COVID test and both came back (+)   This is a 49 yo female who presents today for a virtual visit with above cc. She  has a past medical history of Anxiety, Asthma, Depression, Hyperlipidemia, IBS (irritable bowel syndrome), and Stroke (HCC) (03-13-2015). She reports a 4-day history of headache, sinus congestion, fatigue.  She was initially able to work and has been taking it Excedrin for headache with improvement of headache today.  She reports most bothersome symptom is stuffed up nose.  She has had a little clear nasal drainage.  She denies fever, shortness of breath, wheeze, sore throat, ear pain.  She has an occasional dry cough.  She does have a history of asthma and reports that she does not have any rescue inhaler at home.  She has had 2+ Covid test.  Her daughter has been sick as well and they were both around other people who tested positive over the weekend.   Observations/Objective: Patient is alert and answers questions appropriately.  She sounds very congested over the telephone, she is normally conversive with a little hoarse voice but no evidence of respiratory difficulty or distress.  Occasional dry sounding cough, no audible  wheeze.  Mood and affect are appropriate.  Temp (!) 97.1 F (36.2 C)   Ht 5\' 2"  (1.575 m)   Wt 175 lb (79.4 kg)   BMI 32.01 kg/m  Wt Readings from Last 3 Encounters:  05/07/20 175 lb (79.4 kg)  03/14/20 180 lb (81.6 kg)  01/23/20 184 lb 12.8 oz (83.8 kg)   BP Readings from Last 3 Encounters:  03/14/20 120/82  01/23/20 121/84  12/17/19 (!) 136/97     Assessment and Plan: 1. COVID-19 virus infection -Vaccinated individual with positive COVID-19 test results and symptoms -Reviewed symptomatic treatment, 911/ER/urgent care/follow-up precautions.  She was specifically instructed to call 911 if she develops any difficulty breathing.  Discussed symptoms of secondary infection and she is to report any fever, purulent sputum/nasal drainage, shortness of breath, wheeze -Given comorbidities, she was referred for consideration of monoclonal antibody testing with her permission - MYCHART COVID-19 HOME MONITORING PROGRAM - Temperature monitoring; Future  2. Cough - albuterol (VENTOLIN HFA) 108 (90 Base) MCG/ACT inhaler; Inhale 2 puffs into the lungs every 4 (four) hours as needed for wheezing or shortness of breath (cough, shortness of breath or wheezing.).  Dispense: 1 each; Refill: 1   12/19/19, FNP-BC  [facility], [facility]  05/07/2020 5:51 PM   Follow Up Instructions:    I discussed the assessment and treatment plan with the patient. The patient was provided an opportunity to ask questions and all were answered. The patient agreed with the plan and demonstrated an understanding of the instructions.  The patient was advised to call back or seek an in-person evaluation if the symptoms worsen or if the condition fails to improve as anticipated.   Elby Beck, FNP

## 2020-05-07 NOTE — Telephone Encounter (Signed)
Noted. Virtual visit completed.

## 2020-05-08 ENCOUNTER — Telehealth: Payer: Self-pay | Admitting: Physician Assistant

## 2020-05-08 NOTE — Telephone Encounter (Signed)
Called to discuss with Amber [name] about Covid symptoms and the use of  monoclonal antibody infusion for those with mild to moderate Covid symptoms and at a high risk of hospitalization.     Pt is qualified for this infusion due to co-morbid conditions and/or a member of an at-risk group, however want to read about infusion.  My chart message sent. She will call us if interested. \   Sx onset 12/04 history of CVA, DM, BMI > 25, HTN  Roschelle Calandra, PA-C

## 2020-05-12 ENCOUNTER — Other Ambulatory Visit (HOSPITAL_COMMUNITY): Payer: Self-pay | Admitting: Family

## 2020-05-12 ENCOUNTER — Ambulatory Visit (HOSPITAL_COMMUNITY)
Admission: RE | Admit: 2020-05-12 | Discharge: 2020-05-12 | Disposition: A | Discharge status: Home / Self Care | Payer: BC Managed Care – PPO | Source: Ambulatory Visit | Attending: Pulmonary Disease | Admitting: Pulmonary Disease

## 2020-05-12 DIAGNOSIS — U071 COVID-19: Secondary | ICD-10-CM

## 2020-05-12 MED ORDER — DIPHENHYDRAMINE HCL 50 MG/ML IJ SOLN: 50.0000 mg | Freq: Once | INTRAMUSCULAR | Status: DC | PRN

## 2020-05-12 MED ORDER — SODIUM CHLORIDE 0.9 % IV SOLN: INTRAVENOUS | Status: DC | PRN

## 2020-05-12 MED ORDER — METHYLPREDNISOLONE SODIUM SUCC 125 MG IJ SOLR: 125.0000 mg | Freq: Once | INTRAMUSCULAR | Status: DC | PRN

## 2020-05-12 MED ORDER — SODIUM CHLORIDE 0.9 % IV SOLN: Freq: Once | INTRAVENOUS | Status: AC

## 2020-05-12 MED ORDER — ALBUTEROL SULFATE HFA 108 (90 BASE) MCG/ACT IN AERS: 2.0000 | INHALATION_SPRAY | Freq: Once | RESPIRATORY_TRACT | Status: DC | PRN

## 2020-05-12 MED ORDER — EPINEPHRINE 0.3 MG/0.3ML IJ SOAJ: 0.3000 mg | Freq: Once | INTRAMUSCULAR | Status: DC | PRN

## 2020-05-12 MED ORDER — FAMOTIDINE IN NACL 20-0.9 MG/50ML-% IV SOLN: 20.0000 mg | Freq: Once | INTRAVENOUS | Status: DC | PRN

## 2020-05-12 NOTE — Discharge Instructions (Signed)
10 Things You Can Do to Manage Your COVID-19 Symptoms at Home If you have possible or confirmed COVID-19: 1. Stay home from work and school. And stay away from other public places. If you must go out, avoid using any kind of public transportation, ridesharing, or taxis. 2. Monitor your symptoms carefully. If your symptoms get worse, call your healthcare provider immediately. 3. Get rest and stay hydrated. 4. If you have a medical appointment, call the healthcare provider ahead of time and tell them that you have or may have COVID-19. 5. For medical emergencies, call 911 and notify the dispatch personnel that you have or may have COVID-19. 6. Cover your cough and sneezes with a tissue or use the inside of your elbow. 7. Wash your hands often with soap and water for at least 20 seconds or clean your hands with an alcohol-based hand sanitizer that contains at least 60% alcohol. 8. As much as possible, stay in a specific room and away from other people in your home. Also, you should use a separate bathroom, if available. If you need to be around other people in or outside of the home, wear a mask. 9. Avoid sharing personal items with other people in your household, like dishes, towels, and bedding. 10. Clean all surfaces that are touched often, like counters, tabletops, and doorknobs. Use household cleaning sprays or wipes according to the label instructions. cdc.gov/coronavirus 11/29/2018 This information is not intended to replace advice given to you by your health care provider. Make sure you discuss any questions you have with your health care provider. Document Revised: 05/03/2019 Document Reviewed: 05/03/2019 Elsevier Patient Education  2020 Elsevier Inc. What types of side effects do monoclonal antibody drugs cause?  Common side effects  In general, the more common side effects caused by monoclonal antibody drugs include: . Allergic reactions, such as hives or itching . Flu-like signs and  symptoms, including chills, fatigue, fever, and muscle aches and pains . Nausea, vomiting . Diarrhea . Skin rashes . Low blood pressure   The CDC is recommending patients who receive monoclonal antibody treatments wait at least 90 days before being vaccinated.  Currently, there are no data on the safety and efficacy of mRNA COVID-19 vaccines in persons who received monoclonal antibodies or convalescent plasma as part of COVID-19 treatment. Based on the estimated half-life of such therapies as well as evidence suggesting that reinfection is uncommon in the 90 days after initial infection, vaccination should be deferred for at least 90 days, as a precautionary measure until additional information becomes available, to avoid interference of the antibody treatment with vaccine-induced immune responses. If you have any questions or concerns after the infusion please call the Advanced Practice Provider on call at [phone]. This number is ONLY intended for your use regarding questions or concerns about the infusion post-treatment side-effects.  Please do not provide this number to others for use. For return to work notes please contact your primary care provider.   If someone you know is interested in receiving treatment please have them call the COVID hotline at [phone].   

## 2020-05-12 NOTE — Progress Notes (Signed)
  Diagnosis: COVID-19  Physician: Dr. Wright  Procedure: [facility] Infusion [facility] Med: bamlanivimab\etesevimab infusion - Provided patient with bamlanimivab\etesevimab fact sheet for patients, parents and caregivers prior to infusion.  Complications: No immediate complications noted.  Discharge: Discharged home   Amber [name] 05/12/2020   

## 2020-05-12 NOTE — Progress Notes (Signed)
Patient reviewed Fact Sheet for Patients, Parents, and Caregivers for Emergency Use Authorization (EUA) of bam/ete for the Treatment of Coronavirus. Patient also reviewed and is agreeable to the estimated cost of treatment. Patient is agreeable to proceed.    

## 2020-05-12 NOTE — Progress Notes (Signed)
I connected by phone with Amber [name] on 05/12/2020 at 3:47 PM to discuss the potential use of a new treatment for mild to moderate COVID-19 viral infection in non-hospitalized patients.  This patient is a 49 y.o. female that meets the FDA criteria for Emergency Use Authorization of COVID monoclonal antibody casirivimab/imdevimab, bamlanivimab/eteseviamb, or sotrovimab.  Has a (+) direct SARS-CoV-2 viral test result  Has mild or moderate COVID-19   Is NOT hospitalized due to COVID-19  Is within 10 days of symptom onset  Has at least one of the high risk factor(s) for progression to severe COVID-19 and/or hospitalization as defined in EUA.  Specific high risk criteria : Chronic Lung Disease and Other high risk medical condition per CDC:  Hx CVA   I have spoken and communicated the following to the patient or parent/caregiver regarding COVID monoclonal antibody treatment:  1. FDA has authorized the emergency use for the treatment of mild to moderate COVID-19 in adults and pediatric patients with positive results of direct SARS-CoV-2 viral testing who are 27 years of age and older weighing at least 40 kg, and who are at high risk for progressing to severe COVID-19 and/or hospitalization.  2. The significant known and potential risks and benefits of COVID monoclonal antibody, and the extent to which such potential risks and benefits are unknown.  3. Information on available alternative treatments and the risks and benefits of those alternatives, including clinical trials.  4. Patients treated with COVID monoclonal antibody should continue to self-isolate and use infection control measures (e.g., wear mask, isolate, social distance, avoid sharing personal items, clean and disinfect "high touch" surfaces, and frequent handwashing) according to CDC guidelines.   5. The patient or parent/caregiver has the option to accept or refuse COVID monoclonal antibody treatment.  After reviewing this  information with the patient, the patient has agreed to receive one of the available covid 19 monoclonal antibodies and will be provided an appropriate fact sheet prior to infusion. Morton Stall, NP 05/12/2020 3:47 PM

## 2020-05-14 ENCOUNTER — Telehealth: Payer: Self-pay | Admitting: *Deleted

## 2020-05-14 NOTE — Telephone Encounter (Signed)
Patient called after receiving BPA for worsening appetite from MyChart COVID Questionnaire. Patient states that she has not experienced any vomiting or fever but still has some head congestion. Patient encouraged to drink oral fluids and to try to eat bland solid as tolerated.Patient voiced concern over returning to work and preventing the spread of COVID to her daughter. Explained the isolation precautions and advised that retesting was not recommended prior to 90 days of original positive test. Patient encouraged to contact PCP for additional concerns. Patient verbalized understanding.

## 2020-05-22 ENCOUNTER — Encounter: Payer: Self-pay | Admitting: Primary Care

## 2020-05-22 ENCOUNTER — Telehealth: Payer: Self-pay | Admitting: Primary Care

## 2020-05-22 NOTE — Telephone Encounter (Signed)
Noted  

## 2020-05-22 NOTE — Telephone Encounter (Signed)
Called patient ok with dates 05/06/2020-05/20/2020. With positive test on 05/06/2020 to be in note. Spoke to [place] and letter has been sent to my chart. Will send patient my chart message to let know available online.

## 2020-05-22 NOTE — Telephone Encounter (Signed)
Patient called in about leave of absence note. Patient was calling to see if her letter was ready. Didn't see it in the system. EM

## 2020-06-13 ENCOUNTER — Ambulatory Visit: Payer: Medicaid Other | Admitting: Primary Care

## 2020-06-16 ENCOUNTER — Ambulatory Visit: Payer: Medicaid Other | Admitting: Primary Care

## 2020-06-18 ENCOUNTER — Other Ambulatory Visit (INDEPENDENT_AMBULATORY_CARE_PROVIDER_SITE_OTHER): Payer: BC Managed Care – PPO

## 2020-06-18 ENCOUNTER — Other Ambulatory Visit: Payer: Self-pay | Admitting: Primary Care

## 2020-06-18 ENCOUNTER — Other Ambulatory Visit: Payer: Self-pay

## 2020-06-18 DIAGNOSIS — E785 Hyperlipidemia, unspecified: Secondary | ICD-10-CM

## 2020-06-18 DIAGNOSIS — E119 Type 2 diabetes mellitus without complications: Secondary | ICD-10-CM

## 2020-06-18 LAB — LIPID PANEL
Cholesterol: 210 mg/dL — ABNORMAL HIGH (ref 0–200)
HDL: 70.2 mg/dL (ref >39.00)
LDL Cholesterol: 124 mg/dL — ABNORMAL HIGH (ref 0–99)
NonHDL: 139.66
Total CHOL/HDL Ratio: 3
Triglycerides: 76 mg/dL (ref 0.0–149.0)
VLDL: 15.2 mg/dL (ref 0.0–40.0)

## 2020-06-18 LAB — HEMOGLOBIN A1C: Hgb A1c MFr Bld: 6.3 % (ref 4.6–6.5)

## 2020-06-20 ENCOUNTER — Telehealth: Payer: Medicaid Other | Admitting: Primary Care

## 2020-06-20 ENCOUNTER — Telehealth (INDEPENDENT_AMBULATORY_CARE_PROVIDER_SITE_OTHER): Payer: BC Managed Care – PPO | Admitting: Primary Care

## 2020-06-20 DIAGNOSIS — Z8673 Personal history of transient ischemic attack (TIA), and cerebral infarction without residual deficits: Secondary | ICD-10-CM

## 2020-06-20 DIAGNOSIS — E785 Hyperlipidemia, unspecified: Secondary | ICD-10-CM | POA: Diagnosis not present

## 2020-06-20 DIAGNOSIS — E119 Type 2 diabetes mellitus without complications: Secondary | ICD-10-CM

## 2020-06-20 DIAGNOSIS — F411 Generalized anxiety disorder: Secondary | ICD-10-CM | POA: Diagnosis not present

## 2020-06-20 DIAGNOSIS — I10 Essential (primary) hypertension: Secondary | ICD-10-CM

## 2020-06-20 MED ORDER — BLOOD GLUCOSE MONITOR KIT: PACK | 0 refills | Status: DC

## 2020-06-20 MED ORDER — ATORVASTATIN CALCIUM 40 MG PO TABS: 40.0000 mg | ORAL_TABLET | Freq: Every day | ORAL | 3 refills | Status: DC

## 2020-06-20 NOTE — Progress Notes (Signed)
Subjective:    Patient ID: Amber [name], female    DOB: Feb 15, 1971, 50 y.o.   MRN: [id number]  HPI  Virtual Visit via Video Note  I connected with Amber [name] on 06/20/20 at 11:40 AM EST by a video enabled telemedicine application and verified that I am speaking with the correct person using two identifiers.  Location: Patient: Home Provider: Office Participants: Patient and myself   I discussed the limitations of evaluation and management by telemedicine and the availability of in person appointments. The patient expressed understanding and agreed to proceed.  History of Present Illness:   Ms. Zwilling is a 50 year old female with a history of hypertension, type 2 diabetes (recent diagnosis), CVA, GAD who presents today for follow up of diabetes.   1) Type 2 Diabetes: Current medications include: Metformin ER 500 mg daily  Last A1C: 6.6 in October 2021, 6.3 two days ago. Last Eye Exam: Due Last Foot Exam: Due Pneumonia Vaccination: Completed in 2016 ACE/ARB: None. Urine micro Statin: Lipitor 20 mg, recent LDL of 124.  2) Depression/Anxiety: Chronic and intermittent since her original stroke several years ago. Symptoms include worry, difficulty focusing, difficulty getting things done during the day, poor time management. Symptoms increased over the last nearly two months since her Covid-19 diagnosis. She was once meeting with therapy and found this very helpful.  She would like a referral today.  BP Readings from Last 3 Encounters:  05/12/20 115/90  03/14/20 120/82  01/23/20 121/84    Observations/Objective:  Alert and oriented. Appears well, not sickly. No distress. Speaking in complete sentences.   Assessment and Plan:  See problem based charting.  Follow Up Instructions:  Continue taking Metformin XR 500 mg daily for diabetes.  We increased your atorvastatin to 40 mg, I will send a new prescription to your pharmacy.   You will be contacted regarding  your referral to therapy.  Please let us know if you have not been contacted within two weeks.   Continue taking HCTZ 12.5 mg daily for blood pressure.  Monitor your blood pressure which should be less than 130 on top and less than 90 on bottom.  We will fax a glucometer kit to your pharmacy.  Appropriate times to check your blood sugar levels are:  -Before any meal (breakfast, lunch, dinner) -Two hours after any meal (breakfast, lunch, dinner) -Bedtime  Record your readings and notify me if you continue to consistently run at or above 150   Please schedule a follow-up visit for 3 months for diabetes, cholesterol and blood pressure check.  It was a pleasure to see you today! Allie Bossier, NP-C    I discussed the assessment and treatment plan with the patient. The patient was provided an opportunity to ask questions and all were answered. The patient agreed with the plan and demonstrated an understanding of the instructions.   The patient was advised to call back or seek an in-person evaluation if the symptoms worsen or if the condition fails to improve as anticipated.    Pleas Koch, NP    Review of Systems  Respiratory: Negative for shortness of breath.   Cardiovascular: Negative for chest pain.  Neurological: Negative for dizziness.  Psychiatric/Behavioral:       See HPI       Past Medical History:  Diagnosis Date  . Anxiety    self reported  . Asthma   . Depression    controlled  . Hyperlipidemia    controlled with  medication  . IBS (irritable bowel syndrome)   . Stroke [facility]) 03-13-2015     Social History   Socioeconomic History  . Marital status: Married    Spouse name: Not on file  . Number of children: Not on file  . Years of education: Not on file  . Highest education level: Not on file  Occupational History  . Not on file  Tobacco Use  . Smoking status: Never Smoker  . Smokeless tobacco: Never Used  Vaping Use  . Vaping Use: Never used   Substance and Sexual Activity  . Alcohol use: Yes    Alcohol/week: 7.0 standard drinks    Types: 7 Glasses of wine per week  . Drug use: Yes    Types: Marijuana    Comment: 2x/month  . Sexual activity: Not on file  Other Topics Concern  . Not on file  Social History Narrative   Originally from [place] lives up here in [place]   Has one daughter.   Enjoys spending time shopping and spending time with family    Social Determinants of Health   Financial Resource Strain: Not on file  Food Insecurity: Not on file  Transportation Needs: Not on file  Physical Activity: Not on file  Stress: Not on file  Social Connections: Not on file  Intimate Partner Violence: Not on file    Past Surgical History:  Procedure Laterality Date  . ABDOMINAL HYSTERECTOMY  10/2006  . bowel reconstruction  10/2006   with hysterectomy  . CESAREAN SECTION  2005  . EP IMPLANTABLE DEVICE N/A 03/17/2015   Procedure: Loop Recorder Insertion;  Surgeon: Will Meredith Leeds, MD;  Location: [facility];  Service: Cardiovascular;  Laterality: N/A;  . TEE WITHOUT CARDIOVERSION N/A 03/17/2015   Procedure: TRANSESOPHAGEAL ECHOCARDIOGRAM (TEE);  Surgeon: Skeet Latch, MD;  Location: [facility];  Service: Cardiovascular;  Laterality: N/A;  . TOE SURGERY     Left 2nd metatarsal    Family History  Problem Relation Age of Onset  . Arthritis Father   . Diabetes Father   . Hypertension Father   . Hyperlipidemia Father   . Prostate cancer Father   . Hyperlipidemia Mother   . Stroke Paternal Grandmother   . Heart attack Maternal Grandfather   . Hyperlipidemia Maternal Grandfather   . Prostate cancer Maternal Grandfather   . Heart attack Paternal Grandfather   . Prostate cancer Maternal Uncle        x 3  . Colon cancer Neg Hx   . Colon polyps Neg Hx   . Stomach cancer Neg Hx   . Esophageal cancer Neg Hx   . Pancreatic cancer Neg Hx     Allergies  Allergen Reactions  .  Keflex [Cephalexin] Rash    Current Outpatient Medications on File Prior to Visit  Medication Sig Dispense Refill  . albuterol (VENTOLIN HFA) 108 (90 Base) MCG/ACT inhaler Inhale 2 puffs into the lungs every 4 (four) hours as needed for wheezing or shortness of breath (cough, shortness of breath or wheezing.). 1 each 1  . Ascorbic Acid (VITAMIN C) 1000 MG tablet Take 1,000 mg by mouth daily.    Marland Kitchen aspirin EC 81 MG tablet Take 81 mg by mouth daily.    . cholecalciferol (VITAMIN D3) 25 MCG (1000 UT) tablet Take 1,000 Units by mouth daily.    [name] 575 MG/5ML SYRP Take 2 mg/mL by mouth.    . hydrochlorothiazide (HYDRODIURIL) 12.5 MG tablet  Take 1 tablet (12.5 mg total) by mouth daily. For blood pressure 90 tablet 3  . metFORMIN (GLUCOPHAGE XR) 500 MG 24 hr tablet Take 1 tablet (500 mg total) by mouth daily with breakfast. For diabetes. 90 tablet 3  . Multiple Vitamins-Minerals (MULTIVITAMIN WITH MINERALS) tablet Take 1 tablet by mouth daily.    . pantoprazole (PROTONIX) 40 MG tablet Take 1 tablet (40 mg total) by mouth daily. 90 tablet 1   No current facility-administered medications on file prior to visit.    There were no vitals taken for this visit.   Objective:   Physical Exam Constitutional:      General: She is not in acute distress. Pulmonary:     Effort: Pulmonary effort is normal.  Neurological:     Mental Status: She is alert and oriented to person, place, and time.  Psychiatric:        Mood and Affect: Mood normal.            Assessment & Plan:

## 2020-06-20 NOTE — Assessment & Plan Note (Signed)
Improved with A1c today of 6.3.  Continue metformin ER 500 mg daily.  LDL level is above goal, especially given her history of stroke.  Increase atorvastatin to 40 mg daily for LDL goal of less than 70.  We will plan to see her in the office in 3 months for follow-up.

## 2020-06-20 NOTE — Patient Instructions (Signed)
Continue taking Metformin XR 500 mg daily for diabetes.  We increased your atorvastatin to 40 mg, I will send a new prescription to your pharmacy.   You will be contacted regarding your referral to therapy.  Please let us know if you have not been contacted within two weeks.   Continue taking HCTZ 12.5 mg daily for blood pressure.  Monitor your blood pressure which should be less than 130 on top and less than 90 on bottom.  We will fax a glucometer kit to your pharmacy.  Appropriate times to check your blood sugar levels are:  -Before any meal (breakfast, lunch, dinner) -Two hours after any meal (breakfast, lunch, dinner) -Bedtime  Record your readings and notify me if you continue to consistently run at or above 150   Please schedule a follow-up visit for 3 months for diabetes, cholesterol and blood pressure check.  It was a pleasure to see you today! Allie Bossier, NP-C

## 2020-06-20 NOTE — Assessment & Plan Note (Signed)
Chronic, increased over the last several months given COVID history..  Referral placed to therapy as requested.

## 2020-06-20 NOTE — Assessment & Plan Note (Signed)
Patient only taking hydrochlorothiazide 12.5 mg daily, has not taken amlodipine 10 mg in quite some time.  We will discontinue from medication list for now.  She will monitor her blood pressure at work and will notify me if readings remain at or above 130/90.  Follow-up in a few months for blood pressure check

## 2020-06-20 NOTE — Assessment & Plan Note (Signed)
Recent LDL of 124 which is above goal of less than 70.  Increase atorvastatin to 40 mg daily.  Repeat labs at upcoming visit.

## 2020-06-20 NOTE — Assessment & Plan Note (Signed)
LDL of 124 on recent labs which is above goal of less than 70.  Increase atorvastatin to 40 mg.  Recheck lipids at next visit.

## 2020-07-21 ENCOUNTER — Ambulatory Visit (INDEPENDENT_AMBULATORY_CARE_PROVIDER_SITE_OTHER): Payer: BC Managed Care – PPO | Admitting: Psychology

## 2020-07-21 DIAGNOSIS — F4323 Adjustment disorder with mixed anxiety and depressed mood: Secondary | ICD-10-CM | POA: Diagnosis not present

## 2020-07-30 ENCOUNTER — Other Ambulatory Visit: Payer: Self-pay

## 2020-07-30 ENCOUNTER — Encounter: Payer: Self-pay | Admitting: Nurse Practitioner

## 2020-07-30 ENCOUNTER — Ambulatory Visit (INDEPENDENT_AMBULATORY_CARE_PROVIDER_SITE_OTHER): Payer: BC Managed Care – PPO | Admitting: Nurse Practitioner

## 2020-07-30 VITALS — BP 118/70 | HR 77 | Ht 62.0 in | Wt 177.0 lb

## 2020-07-30 DIAGNOSIS — K5900 Constipation, unspecified: Secondary | ICD-10-CM | POA: Diagnosis not present

## 2020-07-30 DIAGNOSIS — Z1211 Encounter for screening for malignant neoplasm of colon: Secondary | ICD-10-CM | POA: Diagnosis not present

## 2020-07-30 MED ORDER — SUTAB 1479-225-188 MG PO TABS: 1.0000 | ORAL_TABLET | ORAL | 0 refills | Status: DC

## 2020-07-30 NOTE — Progress Notes (Signed)
ASSESSMENT AND PLAN    # 50 year old female with IBS/chronic constipation. --Patient cannot remember why she no longer takes Linzess, in fact cannot even remember ever taking it. --Fiber chips used to work well for her but she no longer takes them for unclear reasons.  Recommend she resume fiber chips since they previously worked for her.  If needed we can always resume Linzess or give her another agent for constipation   #Colon cancer screening.  --Patient will be scheduled for a colonoscopy. The risks and benefits of colonoscopy with possible polypectomy / biopsies were discussed and the patient agrees to proceed.   #Recently diagnosed DM 2, on Metformin now.  --We discussed weight loss and the positive affect that he could have on her diabetes as well as other comorbidities.    HISTORY OF PRESENT ILLNESS     Primary Gastroenterologist : Amber Jarred, MD  Chief Complaint : Needs colon cancer screening, also has chronic constipation  Amber [name] is a 50 y.o. female with PMH significant for chronic constipation, GERD,  CVA, hypertension, hyperlipidemia.  Patient was last seen here March 2019 for evaluation of chronic constipation with IBS.  She was restarted on Linzess 72 mcg daily.  She is due for screening colonoscopy  Interval History:  Amber [name] comes in with complaints of constipation. She is no longer on Linzess and in fact cannot even remember ever taking it.  She drinks warm prune juice which helps.  She used to eat fiber chips which helped yet cannot really remember why she no longer takes them.  On average she has 2 bowel movements a week, stools are sometimes hard.  She drinks water throughout the day .  Her main complaint is feeling tired.  She had Covid in December 2021 .  She was diagnosed with diabetes in January and is now on Metformin   Past Medical History:  Diagnosis Date  . Anxiety    self reported  . Asthma   . Depression    controlled  . Hyperlipidemia     controlled with medication  . IBS (irritable bowel syndrome)   . Stroke [facility]) 03-13-2015    Current Medications, Allergies, Past Surgical History, Family History and Social History were reviewed in [employer] record.   Current Outpatient Medications  Medication Sig Dispense Refill  . albuterol (VENTOLIN HFA) 108 (90 Base) MCG/ACT inhaler Inhale 2 puffs into the lungs every 4 (four) hours as needed for wheezing or shortness of breath (cough, shortness of breath or wheezing.). 1 each 1  . Ascorbic Acid (VITAMIN C) 1000 MG tablet Take 1,000 mg by mouth daily.    Marland Kitchen aspirin EC 81 MG tablet Take 81 mg by mouth daily.    Marland Kitchen atorvastatin (LIPITOR) 40 MG tablet Take 1 tablet (40 mg total) by mouth daily. For cholesterol. 90 tablet 3  . blood glucose meter kit and supplies KIT Dispense based on patient and insurance preference. Use up to four times daily as directed. (FOR ICD-9 250.00, 250.01). 1 each 0  . cholecalciferol (VITAMIN D3) 25 MCG (1000 UT) tablet Take 1,000 Units by mouth daily.    Amber [name] 575 MG/5ML SYRP Take 2 mg/mL by mouth.    . metFORMIN (GLUCOPHAGE XR) 500 MG 24 hr tablet Take 1 tablet (500 mg total) by mouth daily with breakfast. For diabetes. 90 tablet 3  . Multiple Vitamins-Minerals (MULTIVITAMIN WITH MINERALS) tablet Take 1 tablet by mouth daily.    . pantoprazole (PROTONIX) 40  MG tablet Take 1 tablet (40 mg total) by mouth daily. 90 tablet 1   No current facility-administered medications for this visit.    Review of Systems: No chest pain. No shortness of breath. No urinary complaints.   PHYSICAL EXAM :    Wt Readings from Last 3 Encounters:  07/30/20 177 lb (80.3 kg)  05/07/20 175 lb (79.4 kg)  03/14/20 180 lb (81.6 kg)    BP 118/70   Pulse 77   Ht 5' 2" (1.575 m)   Wt 177 lb (80.3 kg)   SpO2 97%   BMI 32.37 kg/m  Constitutional:  Pleasant female in no acute distress. Psychiatric: Normal mood and affect. Behavior is normal. EENT:  Pupils normal.  Conjunctivae are normal. No scleral icterus. Neck supple.  Cardiovascular: Normal rate, regular rhythm. No edema Pulmonary/chest: Effort normal and breath sounds normal. No wheezing, rales or rhonchi. Abdominal: Soft, nondistended, nontender. Bowel sounds active throughout. There are no masses palpable. No hepatomegaly. Neurological: Alert and oriented to person place and time. Skin: Skin is warm and dry. No rashes noted.  Amber Savoy, NP  07/30/2020, 3:50 PM

## 2020-07-30 NOTE — Patient Instructions (Signed)
If you are age 48 or older, your body mass index should be between 23-30. Your Body mass index is 32.37 kg/m. If this is out of the aforementioned range listed, please consider follow up with your Primary Care Provider.  If you are age 78 or younger, your body mass index should be between 19-25. Your Body mass index is 32.37 kg/m. If this is out of the aformentioned range listed, please consider follow up with your Primary Care Provider.   PROCEDURES:  You have been scheduled for a colonoscopy. Please follow the written instructions given to you at your visit today. Please pick up your prep supplies at the pharmacy within the next 1-3 days. If you use inhalers (even only as needed), please bring them with you on the day of your procedure.  You may resume daily fiber chips.  It was great seeing you today! Thank you for entrusting me with your care and choosing [facility].  Willette Cluster, NP

## 2020-08-01 NOTE — Progress Notes (Signed)
Addendum: Reviewed and agree with assessment and management plan. Pyrtle, Jay M, MD  

## 2020-08-04 ENCOUNTER — Ambulatory Visit: Payer: Medicaid Other | Admitting: Psychology

## 2020-08-19 ENCOUNTER — Telehealth (INDEPENDENT_AMBULATORY_CARE_PROVIDER_SITE_OTHER): Payer: BC Managed Care – PPO | Admitting: Family Medicine

## 2020-08-19 ENCOUNTER — Ambulatory Visit: Payer: Medicaid Other

## 2020-08-19 DIAGNOSIS — R059 Cough, unspecified: Secondary | ICD-10-CM

## 2020-08-19 DIAGNOSIS — R0981 Nasal congestion: Secondary | ICD-10-CM | POA: Diagnosis not present

## 2020-08-19 DIAGNOSIS — J040 Acute laryngitis: Secondary | ICD-10-CM

## 2020-08-19 MED ORDER — BENZONATATE 100 MG PO CAPS: 100.0000 mg | ORAL_CAPSULE | Freq: Three times a day (TID) | ORAL | 0 refills | Status: DC | PRN

## 2020-08-19 NOTE — Patient Instructions (Addendum)
   ---------------------------------------------------------------------------------------------------------------------------      WORK SLIP:  Patient Amber [name],  1970/06/02, was seen for a medical visit today, 08/19/20 . Please excuse from work for illness. We advise staying home until improved symptoms.   Sincerely: E-signature: Dr. Kriste Basque, DO  [facility] Ph: [phone]   ------------------------------------------------------------------------------------------------------------------------------     HOME CARE TIPS:   -I sent the medication(s) we discussed to your pharmacy: Meds ordered this encounter  Medications  . benzonatate (TESSALON PERLES) 100 MG capsule    Sig: Take 1 capsule (100 mg total) by mouth 3 (three) times daily as needed.    Dispense:  20 capsule    Refill:  0     -can use tylenol if needed for fevers, aches and pains per instructions  -can use nasal saline a few times per day if you have nasal congestion; sometimes  a short course of Afrin nasal spray for 3 days can help with symptoms as well  -stay hydrated, drink plenty of fluids and eat small healthy meals - avoid dairy  -can take 1000 IU ( ) Vit D3 and 100-500 mg of Vit C daily per instructions  -follow up with your doctor in 2-3 days unless improving and feeling better  -stay home while sick, except to seek medical care  It was nice to meet you today, and I really hope you are feeling better soon. I help [facility] out with telemedicine visits on Tuesdays and Thursdays and am available for visits on those days. If you have any concerns or questions following this visit please schedule a follow up visit with your Primary Care doctor or seek care at a local urgent care clinic to avoid delays in care.    Seek in person care or schedule a follow up video visit promptly if your symptoms worsen, new concerns arise or you are not improving with treatment. Call  911 and/or seek emergency care if your symptoms are severe or life threatening.

## 2020-08-19 NOTE — Progress Notes (Signed)
Virtual Visit via Telephone Note  I connected with Amber [name] on 08/19/20 at  1:00 PM EDT by telephone and verified that I am speaking with the correct person using two identifiers.   I discussed the limitations, risks, security and privacy concerns of performing an evaluation and management service by telephone and the availability of in person appointments. I also discussed with the patient that there may be a patient responsible charge related to this service. The patient expressed understanding and agreed to proceed.  Location patient: home, [place] Location provider: work or home office Participants present for the call: patient, provider Patient did not have a visit with me in the prior 7 days to address this/these issue(s).   History of Present Illness:  Acute telemedicine visit for nasal congestion and cough: -Onset: 4 days -did get a covid test today at a test site, which was negative -Symptoms include: congestion, cough, laryngitis, headache, feels more tired than usual -Denies: fevers, CP, SOB, wheezing, asthma symptoms, loss of taste/smell, NVD, inability to eat/drink/get out of bed -Has tried: musinex DM -Pertinent past medical history: HTN, asthma - reports is control, has hx of stroke, DM -Pertinent medication allergies:keflex -COVID-19 vaccine status: has had two doses + had covid in December; and had flu shot   Observations/Objective: Patient sounds cheerful and well on the phone. I do not appreciate any SOB. Speech and thought processing are grossly intact. Patient reported vitals:  Assessment and Plan:  Nasal congestion  Cough  Laryngitis  -we discussed possible serious and likely etiologies, options for evaluation and workup, limitations of telemedicine visit vs in person visit, treatment, treatment risks and precautions. Pt prefers to treat via telemedicine empirically rather than in person at this moment. Query VURI vs other. Opted for tessalon for cough  and other care measures summarized in patient instrucitons. Work/School slipped offered: provided in patient instructions   Scheduled follow up with PCP offered:agrees to schedule follow up if needed Advised to seek prompt in person care if worsening, new symptoms arise, or if is not improving with treatment. Advised of options for inperson care in case PCP office not available. Did let the patient know that I only do telemedicine shifts for [facility] on Tuesdays and Thursdays and advised a follow up visit with PCP or at an [facility] if has further questions or concerns.   Follow Up Instructions:  I did not refer this patient for an OV with me in the next 24 hours for this/these issue(s).  I discussed the assessment and treatment plan with the patient. The patient was provided an opportunity to ask questions and all were answered. The patient agreed with the plan and demonstrated an understanding of the instructions.   I spent  17 minutes on the date of this visit in the care of this patient. See summary of tasks completed to properly care for this patient in the detailed notes above which also included counseling of above, review of PMH, medications, allergies, evaluation of the patient and ordering and/or  instructing patient on testing and care options.     Terressa Koyanagi, DO

## 2020-08-29 ENCOUNTER — Ambulatory Visit (AMBULATORY_SURGERY_CENTER): Payer: BC Managed Care – PPO | Admitting: Internal Medicine

## 2020-08-29 ENCOUNTER — Other Ambulatory Visit: Payer: Self-pay

## 2020-08-29 ENCOUNTER — Encounter: Payer: Self-pay | Admitting: Internal Medicine

## 2020-08-29 VITALS — BP 121/81 | HR 77 | Temp 96.3°F | Resp 13 | Ht 62.0 in | Wt 177.0 lb

## 2020-08-29 DIAGNOSIS — Z1211 Encounter for screening for malignant neoplasm of colon: Secondary | ICD-10-CM | POA: Diagnosis not present

## 2020-08-29 MED ORDER — SODIUM CHLORIDE 0.9 % IV SOLN
500.0000 mL | Freq: Once | INTRAVENOUS | Status: DC
Start: 2020-08-29 — End: 2020-08-29

## 2020-08-29 NOTE — Patient Instructions (Signed)
Resume fiber supplement.    YOU HAD AN ENDOSCOPIC PROCEDURE TODAY AT THE [facility]:   Refer to the procedure report that was given to you for any specific questions about what was found during the examination.  If the procedure report does not answer your questions, please call your gastroenterologist to clarify.  If you requested that your care partner not be given the details of your procedure findings, then the procedure report has been included in a sealed envelope for you to review at your convenience later.  YOU SHOULD EXPECT: Some feelings of bloating in the abdomen. Passage of more gas than usual.  Walking can help get rid of the air that was put into your GI tract during the procedure and reduce the bloating. If you had a lower endoscopy (such as a colonoscopy or flexible sigmoidoscopy) you may notice spotting of blood in your stool or on the toilet paper. If you underwent a bowel prep for your procedure, you may not have a normal bowel movement for a few days.  Please Note:  You might notice some irritation and congestion in your nose or some drainage.  This is from the oxygen used during your procedure.  There is no need for concern and it should clear up in a day or so.  SYMPTOMS TO REPORT IMMEDIATELY:   Following lower endoscopy (colonoscopy or flexible sigmoidoscopy):  Excessive amounts of blood in the stool  Significant tenderness or worsening of abdominal pains  Swelling of the abdomen that is new, acute  Fever of 100F or higher   For urgent or emergent issues, a gastroenterologist can be reached at any hour by calling [phone]. Do not use MyChart messaging for urgent concerns.    DIET:  We do recommend a small meal at first, but then you may proceed to your regular diet.  Drink plenty of fluids but you should avoid alcoholic beverages for 24 hours.  ACTIVITY:  You should plan to take it easy for the rest of today and you should NOT DRIVE or use heavy  machinery until tomorrow (because of the sedation medicines used during the test).    FOLLOW UP: Our staff will call the number listed on your records 48-72 hours following your procedure to check on you and address any questions or concerns that you may have regarding the information given to you following your procedure. If we do not reach you, we will leave a message.  We will attempt to reach you two times.  During this call, we will ask if you have developed any symptoms of COVID 19. If you develop any symptoms (ie: fever, flu-like symptoms, shortness of breath, cough etc.) before then, please call [phone].  If you test positive for Covid 19 in the 2 weeks post procedure, please call and report this information to [place].    If any biopsies were taken you will be contacted by phone or by letter within the next 1-3 weeks.  Please call us at [phone] if you have not heard about the biopsies in 3 weeks.    SIGNATURES/CONFIDENTIALITY: You and/or your care partner have signed paperwork which will be entered into your electronic medical record.  These signatures attest to the fact that that the information above on your After Visit Summary has been reviewed and is understood.  Full responsibility of the confidentiality of this discharge information lies with you and/or your care-partner.

## 2020-08-29 NOTE — Progress Notes (Signed)
Medical history reviewed with no changes noted. VS assessed by C.W 

## 2020-08-29 NOTE — Op Note (Signed)
[facility] Patient Name: Amber [name] Procedure Date: 08/29/2020 10:46 AM MRN: [id number] Endoscopist: Beverley Fiedler , MD Age: 50 Referring MD:  Date of Birth: [birth year] Gender: Female Account #: [id number] Procedure:                Colonoscopy Indications:              Screening for colorectal malignant neoplasm, Last                            colonoscopy: 2011 Medicines:                Monitored Anesthesia Care Procedure:                Pre-Anesthesia Assessment:                           - Prior to the procedure, a History and Physical                            was performed, and patient medications and                            allergies were reviewed. The patient's tolerance of                            previous anesthesia was also reviewed. The risks                            and benefits of the procedure and the sedation                            options and risks were discussed with the patient.                            All questions were answered, and informed consent                            was obtained. Prior Anticoagulants: The patient has                            taken no previous anticoagulant or antiplatelet                            agents. ASA Grade Assessment: II - A patient with                            mild systemic disease. After reviewing the risks                            and benefits, the patient was deemed in                            satisfactory condition to undergo the procedure.  After obtaining informed consent, the colonoscope                            was passed under direct vision. Throughout the                            procedure, the patient's blood pressure, pulse, and                            oxygen saturations were monitored continuously. The                            Olympus CF-HQ190L [id number]) Colonoscope was                            introduced through the anus and advanced to the                             cecum, identified by appendiceal orifice and                            ileocecal valve. The colonoscopy was performed                            without difficulty. The patient tolerated the                            procedure well. The quality of the bowel                            preparation was fair clearing to adequate with                            copious irrigation and lavage. The ileocecal valve,                            appendiceal orifice, and rectum were photographed. Scope In: 10:57:00 AM Scope Out: 11:15:52 AM Scope Withdrawal Time: 0 hours 14 minutes 44 seconds  Total Procedure Duration: 0 hours 18 minutes 52 seconds  Findings:                 The digital rectal exam was normal.                           The colon (entire examined portion) appeared normal.                           Internal hemorrhoids were found during                            retroflexion. The hemorrhoids were small. Complications:            No immediate complications. Estimated Blood Loss:     Estimated blood loss: none. Impression:               - Preparation of  the colon was fair clearing to                            adequate with copious irrigation and lavage.                           - The entire examined colon is normal.                           - Small internal hemorrhoids.                           - No specimens collected. Recommendation:           - Patient has a contact number available for                            emergencies. The signs and symptoms of potential                            delayed complications were discussed with the                            patient. Return to normal activities tomorrow.                            Written discharge instructions were provided to the                            patient.                           - Resume previous diet.                           - Continue present medications. Resume fiber                             supplementation given history of constipation.                           - Repeat colonoscopy in 5 years for screening                            purposes with 2 day prep. Beverley Fiedler, MD 08/29/2020 11:19:06 AM This report has been signed electronically.

## 2020-08-29 NOTE — Progress Notes (Signed)
A and O x3. Report to RN. Tolerated MAC anesthesia well.

## 2020-09-02 ENCOUNTER — Telehealth: Payer: Self-pay | Admitting: *Deleted

## 2020-09-02 NOTE — Telephone Encounter (Signed)
  Follow up Call-  Call back number 08/29/2020  Post procedure Call Back phone  # [phone]  Permission to leave phone message Yes  Some recent data might be hidden     Patient questions:  Do you have a fever, pain , or abdominal swelling? No. Pain Score  0 *  Have you tolerated food without any problems? Yes.    Have you been able to return to your normal activities? Yes.    Do you have any questions about your discharge instructions: Diet   No. Medications  No. Follow up visit  No.  Do you have questions or concerns about your Care? No.  Actions: * If pain score is 4 or above: No action needed, pain <4.  1. Have you developed a fever since your procedure? no  2.   Have you had an respiratory symptoms (SOB or cough) since your procedure? no  3.   Have you tested positive for COVID 19 since your procedure no  4.   Have you had any family members/close contacts diagnosed with the COVID 19 since your procedure?  no   If yes to any of these questions please route to Laverna Peace, RN and Karlton Lemon, RN

## 2020-09-03 ENCOUNTER — Telehealth: Payer: Self-pay | Admitting: Primary Care

## 2020-09-03 NOTE — Telephone Encounter (Signed)
Patient called in asking about a script to get a Diabetes glucose meter and supplies. Please advise. EM

## 2020-09-03 NOTE — Telephone Encounter (Signed)
Did she say what pharmacy and to clarify she wants tester, strips and lancets. Right

## 2020-09-03 NOTE — Telephone Encounter (Signed)
She would like it to go to [facility].  Tester, strips, and lancets are needed. She has nothing to test herself with. EM

## 2020-09-08 ENCOUNTER — Other Ambulatory Visit: Payer: Self-pay

## 2020-09-08 MED ORDER — FREESTYLE LIBRE 14 DAY SENSOR MISC: 1 refills | Status: DC

## 2020-09-08 MED ORDER — FREESTYLE LIBRE 14 DAY READER DEVI: 0 refills | Status: DC

## 2020-09-08 NOTE — Telephone Encounter (Signed)
Patient called and stated that she never received the prescription for the glucose meter and supplies, but she is interested in getting the Free Style Josephine Igo if possible. Please advise.

## 2020-09-08 NOTE — Telephone Encounter (Signed)
Called patient let know that we will call in free style and se will let me know next week if able to get covered with insurance. If not we will call in the regular tester. She never received in January.

## 2020-09-16 ENCOUNTER — Ambulatory Visit (INDEPENDENT_AMBULATORY_CARE_PROVIDER_SITE_OTHER): Payer: BC Managed Care – PPO | Admitting: Primary Care

## 2020-09-16 ENCOUNTER — Other Ambulatory Visit: Payer: Self-pay

## 2020-09-16 ENCOUNTER — Encounter: Payer: Self-pay | Admitting: Primary Care

## 2020-09-16 VITALS — BP 118/84 | HR 73 | Temp 97.5°F | Ht 62.0 in | Wt 178.8 lb

## 2020-09-16 DIAGNOSIS — E785 Hyperlipidemia, unspecified: Secondary | ICD-10-CM

## 2020-09-16 DIAGNOSIS — F411 Generalized anxiety disorder: Secondary | ICD-10-CM | POA: Diagnosis not present

## 2020-09-16 DIAGNOSIS — R5383 Other fatigue: Secondary | ICD-10-CM | POA: Diagnosis not present

## 2020-09-16 DIAGNOSIS — I10 Essential (primary) hypertension: Secondary | ICD-10-CM | POA: Diagnosis not present

## 2020-09-16 DIAGNOSIS — E119 Type 2 diabetes mellitus without complications: Secondary | ICD-10-CM

## 2020-09-16 NOTE — Assessment & Plan Note (Signed)
Well controlled in the office today, continue HCTZ 12.5 mg. BMP pending.

## 2020-09-16 NOTE — Progress Notes (Signed)
Subjective:    Patient ID: Amber [name], female    DOB: Nov 15, 1970, 50 y.o.   MRN: [id number]  HPI  Amber [name] is a very pleasant 50 y.o. female with a history of type 2 diabetes, hypertension, hyperlipidemia, CVA who presents today for follow up.  1) History of CVA/Hyperlipidemia: Currently managed on atorvastatin 40 mg which was increased in January 2022 due to LDL of 124. LDL goal is <70 given history of diabetes and CVA. She is due for repeat lipid panel today.  2) Type 2 Diabetes:  Current medications include: Metformin XR 500 mg   Last A1C: 6.3 in January 2022 Last Eye Exam: UTD Last Foot Exam: UTD Pneumonia Vaccination: 2016 ACE/ARB: None. Urine microalbumin pending. Statin: atorvastatin  3) Hypertension: Currently managed on HCTZ 12.5 mg. She denies chest pain, dizziness, headaches.   BP Readings from Last 3 Encounters:  09/16/20 118/84  08/29/20 121/81  07/30/20 118/70   4) Fatigue: Also with difficulty focusing, sadness, time management problems. She is working to improve sleeping patterns, catches herself falling asleep in the later afternoon, can doze off at various times when sitting still during the day. She is following with therapy through [facility] counseling, has an appointment scheduled for later today. She is working 50 hours weekly.   She gets to bed around 10 pm, sometimes difficulty falling asleep, sometimes difficult to wake up. Wakes around 5:15 am. Does wake most nights around 1 am. She has been told that she snores loudly at night, has never had a sleep study.   Wt Readings from Last 3 Encounters:  09/16/20 178 lb 12 oz (81.1 kg)  08/29/20 177 lb (80.3 kg)  07/30/20 177 lb (80.3 kg)      Review of Systems  Constitutional: Positive for fatigue.  Respiratory: Negative for shortness of breath.   Cardiovascular: Negative for chest pain.  Neurological: Negative for dizziness and headaches.  Psychiatric/Behavioral:       See HPI          Past Medical History:  Diagnosis Date  . Anxiety    self reported  . Asthma   . Depression    controlled  . Hyperlipidemia    controlled with medication  . IBS (irritable bowel syndrome)   . Stroke [facility]) 03-13-2015    Social History   Socioeconomic History  . Marital status: Married    Spouse name: Not on file  . Number of children: Not on file  . Years of education: Not on file  . Highest education level: Not on file  Occupational History  . Not on file  Tobacco Use  . Smoking status: Never Smoker  . Smokeless tobacco: Never Used  Vaping Use  . Vaping Use: Never used  Substance and Sexual Activity  . Alcohol use: Yes    Alcohol/week: 7.0 standard drinks    Types: 7 Glasses of wine per week  . Drug use: Yes    Types: Marijuana    Comment: 2x/month  . Sexual activity: Not on file  Other Topics Concern  . Not on file  Social History Narrative   Originally from [place] lives up here in [place]   Has one daughter.   Enjoys spending time shopping and spending time with family    Social Determinants of Health   Financial Resource Strain: Not on file  Food Insecurity: Not on file  Transportation Needs: Not on file  Physical Activity: Not on file  Stress: Not on  file  Social Connections: Not on file  Intimate Partner Violence: Not on file    Past Surgical History:  Procedure Laterality Date  . ABDOMINAL HYSTERECTOMY  10/2006  . bowel reconstruction  10/2006   with hysterectomy  . CESAREAN SECTION  2005  . EP IMPLANTABLE DEVICE N/A 03/17/2015   Procedure: Loop Recorder Insertion;  Surgeon: Will Meredith Leeds, MD;  Location: [facility];  Service: Cardiovascular;  Laterality: N/A;  . TEE WITHOUT CARDIOVERSION N/A 03/17/2015   Procedure: TRANSESOPHAGEAL ECHOCARDIOGRAM (TEE);  Surgeon: Skeet Latch, MD;  Location[facility];  Service: Cardiovascular;  Laterality: N/A;  . TOE SURGERY     Left 2nd metatarsal    Family  History  Problem Relation Age of Onset  . Arthritis Father   . Diabetes Father   . Hypertension Father   . Hyperlipidemia Father   . Prostate cancer Father   . Hyperlipidemia Mother   . Stroke Paternal Grandmother   . Heart attack Maternal Grandfather   . Hyperlipidemia Maternal Grandfather   . Prostate cancer Maternal Grandfather   . Heart attack Paternal Grandfather   . Prostate cancer Maternal Uncle        x 3  . Colon cancer Neg Hx   . Colon polyps Neg Hx   . Stomach cancer Neg Hx   . Esophageal cancer Neg Hx   . Pancreatic cancer Neg Hx     Allergies  Allergen Reactions  . Keflex [Cephalexin] Rash    Current Outpatient Medications on File Prior to Visit  Medication Sig Dispense Refill  . albuterol (VENTOLIN HFA) 108 (90 Base) MCG/ACT inhaler Inhale 2 puffs into the lungs every 4 (four) hours as needed for wheezing or shortness of breath (cough, shortness of breath or wheezing.). 1 each 1  . Ascorbic Acid (VITAMIN C) 1000 MG tablet Take 1,000 mg by mouth daily.    Marland Kitchen aspirin EC 81 MG tablet Take 81 mg by mouth daily.    Marland Kitchen atorvastatin (LIPITOR) 40 MG tablet Take 1 tablet (40 mg total) by mouth daily. For cholesterol. 90 tablet 3  . benzonatate (TESSALON PERLES) 100 MG capsule Take 1 capsule (100 mg total) by mouth 3 (three) times daily as needed. 20 capsule 0  . blood glucose meter kit and supplies KIT Dispense based on patient and insurance preference. Use up to four times daily as directed. (FOR ICD-9 250.00, 250.01). 1 each 0  . cholecalciferol (VITAMIN D3) 25 MCG (1000 UT) tablet Take 1,000 Units by mouth daily.    . Continuous Blood Gluc Receiver (FREESTYLE LIBRE 14 DAY READER) DEVI Use with sensor to check blood sugar. 1 each 0  . Continuous Blood Gluc Sensor (FREESTYLE LIBRE 14 DAY SENSOR) MISC Use as directed to check blood sugars. 6 each 1  . Elderberry 575 MG/5ML SYRP Take 2 mg/mL by mouth.    . metFORMIN (GLUCOPHAGE XR) 500 MG 24 hr tablet Take 1 tablet (500 mg  total) by mouth daily with breakfast. For diabetes. 90 tablet 3  . Multiple Vitamins-Minerals (MULTIVITAMIN WITH MINERALS) tablet Take 1 tablet by mouth daily.    . pantoprazole (PROTONIX) 40 MG tablet Take 1 tablet (40 mg total) by mouth daily. 90 tablet 1   No current facility-administered medications on file prior to visit.    BP 118/84   Pulse 73   Temp (!) 97.5 F (36.4 C) (Temporal)   Ht '5\' 2"'  (1.575 m)   Wt 178 lb 12 oz (81.1 kg)  SpO2 96%   BMI 32.69 kg/m  Objective:   Physical Exam Cardiovascular:     Rate and Rhythm: Normal rate and regular rhythm.  Pulmonary:     Effort: Pulmonary effort is normal.     Breath sounds: Normal breath sounds.  Musculoskeletal:     Cervical back: Neck supple.  Skin:    General: Skin is warm and dry.  Psychiatric:        Mood and Affect: Mood normal.           Assessment & Plan:      This visit occurred during the SARS-CoV-2 public health emergency.  Safety protocols were in place, including screening questions prior to the visit, additional usage of staff PPE, and extensive cleaning of exam room while observing appropriate contact time as indicated for disinfecting solutions.

## 2020-09-16 NOTE — Assessment & Plan Note (Signed)
Compliant to metformin ER 500 mg daily, continue same.  Urine microalbumin due and pending. Managed on statin. Eye exam and foot exam UTD. Repeat A1C pending.   Follow up in 6 months.

## 2020-09-16 NOTE — Assessment & Plan Note (Signed)
Chronic, HPI suspicious for sleep apnea. Epworth Sleepiness scale score of 13 today.   Will start with labs including CBC, TSH, Vitamin B12 and D, BMP.  Consider referral to pulmonology for sleep study once labs return. She agrees.

## 2020-09-16 NOTE — Assessment & Plan Note (Signed)
Now following with therapy which is helping. Continue same.

## 2020-09-16 NOTE — Patient Instructions (Signed)
Stop by the lab prior to leaving today. I will notify you of your results once received.   Please schedule a follow up appointment in 6 months for complete physical.   It was a pleasure to see you today!

## 2020-09-16 NOTE — Assessment & Plan Note (Signed)
Compliant to atorvastatin 40 mg, continue same. °Repeat lipid panel pending.  °

## 2020-09-17 LAB — HEMOGLOBIN A1C: Hgb A1c MFr Bld: 6.4 % (ref 4.6–6.5)

## 2020-09-17 LAB — BASIC METABOLIC PANEL
BUN: 15 mg/dL (ref 6–23)
CO2: 31 mEq/L (ref 19–32)
Calcium: 9.6 mg/dL (ref 8.4–10.5)
Chloride: 101 mEq/L (ref 96–112)
Creatinine, Ser: 1.03 mg/dL (ref 0.40–1.20)
GFR: 64.53 mL/min (ref >60.00)
Glucose, Bld: 80 mg/dL (ref 70–99)
Potassium: 3.7 mEq/L (ref 3.5–5.1)
Sodium: 139 mEq/L (ref 135–145)

## 2020-09-17 LAB — LIPID PANEL
Cholesterol: 187 mg/dL (ref 0–200)
HDL: 68 mg/dL (ref >39.00)
LDL Cholesterol: 95 mg/dL (ref 0–99)
NonHDL: 118.59
Total CHOL/HDL Ratio: 3
Triglycerides: 116 mg/dL (ref 0.0–149.0)
VLDL: 23.2 mg/dL (ref 0.0–40.0)

## 2020-09-17 LAB — MICROALBUMIN / CREATININE URINE RATIO
Creatinine,U: 79.6 mg/dL
Microalb Creat Ratio: 0.9 mg/g (ref 0.0–30.0)
Microalb, Ur: <0.7 mg/dL (ref 0.0–1.9)

## 2020-09-17 LAB — VITAMIN D 25 HYDROXY (VIT D DEFICIENCY, FRACTURES): VITD: 53.72 ng/mL (ref 30.00–100.00)

## 2020-09-17 LAB — CBC
HCT: 44.6 % (ref 36.0–46.0)
Hemoglobin: 14.8 g/dL (ref 12.0–15.0)
MCHC: 33.2 g/dL (ref 30.0–36.0)
MCV: 86.6 fl (ref 78.0–100.0)
Platelets: 257 10*3/uL (ref 150.0–400.0)
RBC: 5.15 Mil/uL — ABNORMAL HIGH (ref 3.87–5.11)
RDW: 14.3 % (ref 11.5–15.5)
WBC: 6.3 10*3/uL (ref 4.0–10.5)

## 2020-09-17 LAB — VITAMIN B12: Vitamin B-12: 1177 pg/mL — ABNORMAL HIGH (ref 211–911)

## 2020-09-17 LAB — TSH: TSH: 1.45 u[IU]/mL (ref 0.35–4.50)

## 2020-09-25 ENCOUNTER — Other Ambulatory Visit: Payer: Self-pay | Admitting: Primary Care

## 2020-09-25 ENCOUNTER — Telehealth: Payer: Self-pay

## 2020-09-25 DIAGNOSIS — E785 Hyperlipidemia, unspecified: Secondary | ICD-10-CM

## 2020-09-25 MED ORDER — ATORVASTATIN CALCIUM 80 MG PO TABS
80.0000 mg | ORAL_TABLET | Freq: Every day | ORAL | 3 refills | Status: DC
Start: 2020-09-25 — End: 2020-12-30

## 2020-09-25 NOTE — Telephone Encounter (Signed)
Pt left vm returning a call °

## 2020-10-03 ENCOUNTER — Telehealth (INDEPENDENT_AMBULATORY_CARE_PROVIDER_SITE_OTHER): Payer: BC Managed Care – PPO | Admitting: Registered Nurse

## 2020-10-03 ENCOUNTER — Other Ambulatory Visit: Payer: Self-pay

## 2020-10-03 ENCOUNTER — Encounter: Payer: Self-pay | Admitting: Registered Nurse

## 2020-10-03 DIAGNOSIS — R059 Cough, unspecified: Secondary | ICD-10-CM

## 2020-10-03 DIAGNOSIS — R0981 Nasal congestion: Secondary | ICD-10-CM

## 2020-10-03 MED ORDER — BENZONATATE 200 MG PO CAPS: 200.0000 mg | ORAL_CAPSULE | Freq: Two times a day (BID) | ORAL | 0 refills | Status: DC | PRN

## 2020-10-03 MED ORDER — HYDROCODONE BIT-HOMATROP MBR 5-1.5 MG/5ML PO SOLN: 5.0000 mL | Freq: Three times a day (TID) | ORAL | 0 refills | Status: DC | PRN

## 2020-10-03 MED ORDER — AZELASTINE HCL 0.1 % NA SOLN: 1.0000 | Freq: Two times a day (BID) | NASAL | 12 refills | Status: DC

## 2020-10-03 NOTE — Progress Notes (Signed)
Telemedicine Encounter- SOAP NOTE Established Patient  This telephone encounter was conducted with the patient's (or proxy's) verbal consent via audio telecommunications: yes  Patient was instructed to have this encounter in a suitably private space; and to only have persons present to whom they give permission to participate. In addition, patient identity was confirmed by use of name plus two identifiers (DOB and address).  I discussed the limitations, risks, security and privacy concerns of performing an evaluation and management service by telephone and the availability of in person appointments. I also discussed with the patient that there may be a patient responsible charge related to this service. The patient expressed understanding and agreed to proceed.  I spent a total of 15 minutes talking with the patient or their proxy.  Patient at home Provider in office  Participants: Kathrin Ruddy, NP and [name]  Chief Complaint  Patient presents with  . Cough    Patient states since Tuesday is  headache ear pain, runny nose, diarrhea, and congestion. Patient has took some otc medication and has some relief but know that covid and the flu is going around and she works in [employer].    Subjective   Amber [name] is a 50 y.o. established patient. Telephone visit today for cough  HPI Onset Tuesday. Worsening Malaise, sore throat, sinus pressure, and mild shob Has not taken temp but feels clammy. Sick contacts: both COVID and flu Has had two doses of COVID vaccine, did not get booster as she had covid infection in December Did not get flu shot this year.  Has tried some OTCs. Some relief, not total.   Patient Active Problem List   Diagnosis Date Noted  . 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 Splinter in skin, left foot 12/11/2019  . Allergic rhinitis due to pollen 10/15/2019  . Concentration deficit 08/14/2019  . Non-cardiac chest pain 05/18/2019  . Anxiety  04/13/2019  . Weight 03/06/2019  . Right flank pain 11/22/2018  . Left shoulder pain 08/02/2018  . Word finding difficulty 02/27/2018  . Restless leg syndrome 12/29/2016  . GERD (gastroesophageal reflux disease) 08/24/2016  . Hemorrhoids 08/24/2016  . Healthcare maintenance 08/24/2016  . GAD (generalized anxiety disorder) 06/22/2016  . Levoscoliosis 10/06/2015  . Type 2 diabetes mellitus ([place]) 09/08/2015  . Fatigue 04/03/2015  . History of stroke 03/14/2015  . IBS (irritable bowel syndrome) 09/29/2014  . Hyperlipidemia 09/29/2014  . Essential hypertension 09/29/2014    Past Medical History:  Diagnosis Date  . Anxiety    self reported  . Asthma   . Depression    controlled  . Hyperlipidemia    controlled with medication  . IBS (irritable bowel syndrome)   . Stroke [facility]) 03-13-2015    Current Outpatient Medications  Medication Sig Dispense Refill  . albuterol (VENTOLIN HFA) 108 (90 Base) MCG/ACT inhaler Inhale 2 puffs into the lungs every 4 (four) hours as needed for wheezing or shortness of breath (cough, shortness of breath or wheezing.). 1 each 1  . Ascorbic Acid (VITAMIN C) 1000 MG tablet Take 1,000 mg by mouth daily.    Marland Kitchen aspirin EC 81 MG tablet Take 81 mg by mouth daily.    Marland Kitchen atorvastatin (LIPITOR) 80 MG tablet Take 1 tablet (80 mg total) by mouth daily. For cholesterol 90 tablet 3  . blood glucose meter kit and supplies KIT Dispense based on patient and insurance preference. Use up to four times daily as directed. (FOR ICD-9 250.00, 250.01). 1 each  0  . cholecalciferol (VITAMIN D3) 25 MCG (1000 UT) tablet Take 1,000 Units by mouth daily.    . Continuous Blood Gluc Receiver (FREESTYLE LIBRE 14 DAY READER) DEVI Use with sensor to check blood sugar. 1 each 0  . Continuous Blood Gluc Sensor (FREESTYLE LIBRE 14 DAY SENSOR) MISC Use as directed to check blood sugars. 6 each 1  . Elderberry 575 MG/5ML SYRP Take 2 mg/mL by mouth.    . metFORMIN (GLUCOPHAGE XR) 500 MG 24 hr  tablet Take 1 tablet (500 mg total) by mouth daily with breakfast. For diabetes. 90 tablet 3  . Multiple Vitamins-Minerals (MULTIVITAMIN WITH MINERALS) tablet Take 1 tablet by mouth daily.    . pantoprazole (PROTONIX) 40 MG tablet Take 1 tablet (40 mg total) by mouth daily. 90 tablet 1   No current facility-administered medications for this visit.    Allergies  Allergen Reactions  . Keflex [Cephalexin] Rash    Social History   Socioeconomic History  . Marital status: Married    Spouse name: Not on file  . Number of children: Not on file  . Years of education: Not on file  . Highest education level: Not on file  Occupational History  . Not on file  Tobacco Use  . Smoking status: Never Smoker  . Smokeless tobacco: Never Used  Vaping Use  . Vaping Use: Never used  Substance and Sexual Activity  . Alcohol use: Yes    Alcohol/week: 7.0 standard drinks    Types: 7 Glasses of wine per week  . Drug use: Yes    Types: Marijuana    Comment: 2x/month  . Sexual activity: Not on file  Other Topics Concern  . Not on file  Social History Narrative   Originally from [place] lives up here in [place]   Has one daughter.   Enjoys spending time shopping and spending time with family    Social Determinants of Health   Financial Resource Strain: Not on file  Food Insecurity: Not on file  Transportation Needs: Not on file  Physical Activity: Not on file  Stress: Not on file  Social Connections: Not on file  Intimate Partner Violence: Not on file    Review of Systems  Constitutional: Positive for chills, fever and malaise/fatigue.  HENT: Positive for sinus pain and sore throat.   Eyes: Negative.   Respiratory: Positive for cough.   Cardiovascular: Negative.   Gastrointestinal: Negative.   Genitourinary: Negative.   Musculoskeletal: Negative.   Skin: Negative.   Neurological: Negative.   Endo/Heme/Allergies: Negative.   Psychiatric/Behavioral: Negative.    All other systems reviewed and are negative.   Objective   Vitals as reported by the patient: There were no vitals filed for this visit.  There are no diagnoses linked to this encounter.  PLAN  Suspect viral etiology. Sounds like seasonal flu. Suggest rapid test for flu and covid at pharmacy if available.  Conservative treatment with symptom control and rest. Cough syrup and tessalon as above. Azelastine for nasal congestion  Reviewed ER and UCC precautions with pt who voices understanding  Work note provided. If viral, expect that she will improve substantially to return to work Monday without restrictions  Patient encouraged to call clinic with any questions, comments, or concerns.  I discussed the assessment and treatment plan with the patient. The patient was provided an opportunity to ask questions and all were answered. The patient agreed with the plan and demonstrated an understanding of  the instructions.   The patient was advised to call back or seek an in-person evaluation if the symptoms worsen or if the condition fails to improve as anticipated.  I provided 15 minutes of non-face-to-face time during this encounter.  Maximiano Coss, NP  Primary Care at [facility]

## 2020-10-03 NOTE — Patient Instructions (Signed)
° ° ° °  If you have lab work done today you will be contacted with your lab results within the next 2 weeks.  If you have not heard from us then please contact us. The fastest way to get your results is to register for My Chart. ° ° °IF you received an x-ray today, you will receive an invoice from [facility]. Please contact [facility] at [phone] with questions or concerns regarding your invoice.  ° °IF you received labwork today, you will receive an invoice from [facility]. Please contact [facility] at [phone] with questions or concerns regarding your invoice.  ° °Our billing staff will not be able to assist you with questions regarding bills from these companies. ° °You will be contacted with the lab results as soon as they are available. The fastest way to get your results is to activate your My Chart account. Instructions are located on the last page of this paperwork. If you have not heard from us regarding the results in 2 weeks, please contact this office. °  ° ° ° °

## 2020-10-21 ENCOUNTER — Telehealth: Payer: Self-pay | Admitting: *Deleted

## 2020-10-21 NOTE — Telephone Encounter (Signed)
Pt notified of Mayra Reel, NP's comments and instructions and verbalized understanding. Pt will keep an eye on blood sugar readings and keep [place] posted if she develops any sxs

## 2020-10-21 NOTE — Telephone Encounter (Signed)
Pt called into triage with some concerns regarding her blood sugar. Pt has the freestyle Libre meter and yesterday all of her reading were normal and around the 80s. Pt is concerned because both times she checked it today they were low once it was 59 and the other time it was 69, pt isn't having any sxs at all she feels fine. No light headed, no dizziness, no "foggy feeling" pt feels normal and at baseline. Pt is concerned given her prev health issues that her BS is to low. Pt is taking Metformin as prescribed but is just worried, and wants to know what should she do

## 2020-10-21 NOTE — Telephone Encounter (Signed)
Blood sugar will fluctuate throughout the day, especially fasting. Metformin does not directly lower blood sugars, it works by influencing the pancreas to secrete insulin when necessary.  Given the fact that she is asymptomatic coupled with her last A1c of 6.4, I recommend she continue metformin but to notify us if she becomes symptomatic.  Symptoms of low blood sugar would include feeling jittery/weak/shaky.

## 2020-11-13 ENCOUNTER — Emergency Department: Payer: BC Managed Care – PPO

## 2020-11-13 ENCOUNTER — Emergency Department
Admission: EM | Admit: 2020-11-13 | Discharge: 2020-11-13 | Disposition: A | Discharge status: Home / Self Care | Payer: BC Managed Care – PPO | Attending: Emergency Medicine | Admitting: Emergency Medicine

## 2020-11-13 ENCOUNTER — Encounter: Payer: Self-pay | Admitting: Emergency Medicine

## 2020-11-13 ENCOUNTER — Other Ambulatory Visit: Payer: Self-pay

## 2020-11-13 DIAGNOSIS — Z7982 Long term (current) use of aspirin: Secondary | ICD-10-CM | POA: Insufficient documentation

## 2020-11-13 DIAGNOSIS — M542 Cervicalgia: Secondary | ICD-10-CM | POA: Diagnosis not present

## 2020-11-13 DIAGNOSIS — Z7984 Long term (current) use of oral hypoglycemic drugs: Secondary | ICD-10-CM | POA: Insufficient documentation

## 2020-11-13 DIAGNOSIS — Y92410 Unspecified street and highway as the place of occurrence of the external cause: Secondary | ICD-10-CM | POA: Insufficient documentation

## 2020-11-13 DIAGNOSIS — Z79899 Other long term (current) drug therapy: Secondary | ICD-10-CM | POA: Insufficient documentation

## 2020-11-13 DIAGNOSIS — I10 Essential (primary) hypertension: Secondary | ICD-10-CM | POA: Insufficient documentation

## 2020-11-13 DIAGNOSIS — J45909 Unspecified asthma, uncomplicated: Secondary | ICD-10-CM | POA: Insufficient documentation

## 2020-11-13 DIAGNOSIS — E785 Hyperlipidemia, unspecified: Secondary | ICD-10-CM | POA: Insufficient documentation

## 2020-11-13 DIAGNOSIS — E1169 Type 2 diabetes mellitus with other specified complication: Secondary | ICD-10-CM | POA: Diagnosis not present

## 2020-11-13 DIAGNOSIS — R519 Headache, unspecified: Secondary | ICD-10-CM | POA: Diagnosis not present

## 2020-11-13 MED ORDER — NAPROXEN 500 MG PO TABS
500.0000 mg | ORAL_TABLET | Freq: Once | ORAL | Status: AC
  Administered 2020-11-13: 500 mg via ORAL
  Filled 2020-11-13: qty 1

## 2020-11-13 MED ORDER — LIDOCAINE 5 % EX PTCH
1.0000 | MEDICATED_PATCH | CUTANEOUS | Status: DC
  Administered 2020-11-13: 1 via TRANSDERMAL
  Filled 2020-11-13: qty 1

## 2020-11-13 MED ORDER — ACETAMINOPHEN 500 MG PO TABS
1000.0000 mg | ORAL_TABLET | Freq: Once | ORAL | Status: AC
  Administered 2020-11-13: 1000 mg via ORAL
  Filled 2020-11-13: qty 2

## 2020-11-13 NOTE — ED Notes (Signed)
Patient transported to CT 
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
## 2020-11-14 ENCOUNTER — Telehealth: Payer: Self-pay

## 2020-11-14 ENCOUNTER — Telehealth: Payer: Self-pay | Admitting: *Deleted
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
## 2020-11-14 NOTE — Telephone Encounter (Signed)
Not sure what she's already tried but here are some options:  Have her take a dose of Ibuprofen 600 mg every 8 hours or Tylenol Extra Strength 1000 mg every 8 hours.  She could take a dose of 600 mg Ibuprofen along with a 500 mg tablet of Tylenol at the same time. Okay to repeat this every 8 hours.

## 2020-11-14 NOTE — Telephone Encounter (Signed)
Transition Care Management Follow-up Telephone Call Date of discharge and from where: 11/13/2020 [facility] ED How have you been since you were released from the hospital? "Really sore" Any questions or concerns? No  Items Reviewed: Did the pt receive and understand the discharge instructions provided? Yes  Medications obtained and verified? Yes  Other? No  Any new allergies since your discharge? No  Dietary orders reviewed? No Do you have support at home? Yes    Functional Questionnaire: (I = Independent and D = Dependent) ADLs: I  Bathing/Dressing- I  Meal Prep- I  Eating- I  Maintaining continence- I  Transferring/Ambulation- I  Managing Meds- I  Follow up appointments reviewed:  PCP Hospital f/u appt confirmed? No  - has physical scheduled in July Specialist Hospital f/u appt confirmed? No   Are transportation arrangements needed? No  If their condition worsens, is the pt aware to call PCP or go to the Emergency Dept.? Yes Was the patient provided with contact information for the PCP's office or ED? Yes Was to pt encouraged to call back with questions or concerns? Yes

## 2020-11-14 NOTE — Telephone Encounter (Signed)
Spoke to pt and relayed Kate's suggestions for headache relief. Pt stated understanding. Will also copy and paste the advice into a mychart message for pt to refer back to.

## 2020-11-18 ENCOUNTER — Encounter: Payer: Self-pay | Admitting: Primary Care

## 2020-11-18 ENCOUNTER — Other Ambulatory Visit: Payer: Self-pay

## 2020-11-18 ENCOUNTER — Ambulatory Visit (INDEPENDENT_AMBULATORY_CARE_PROVIDER_SITE_OTHER): Payer: BC Managed Care – PPO | Admitting: Primary Care

## 2020-11-18 VITALS — BP 110/68 | HR 67 | Temp 98.0°F | Ht 62.0 in | Wt 173.0 lb

## 2020-11-18 DIAGNOSIS — G44209 Tension-type headache, unspecified, not intractable: Secondary | ICD-10-CM | POA: Diagnosis not present

## 2020-11-18 DIAGNOSIS — R519 Headache, unspecified: Secondary | ICD-10-CM | POA: Insufficient documentation

## 2020-11-18 DIAGNOSIS — M542 Cervicalgia: Secondary | ICD-10-CM | POA: Insufficient documentation

## 2020-11-18 HISTORY — DX: Tension-type headache, unspecified, not intractable: G44.209

## 2020-11-18 MED ORDER — KETOROLAC TROMETHAMINE 60 MG/2ML IM SOLN
60.0000 mg | Freq: Once | INTRAMUSCULAR | Status: AC
  Administered 2020-11-18: 60 mg via INTRAMUSCULAR

## 2020-11-18 MED ORDER — CYCLOBENZAPRINE HCL 5 MG PO TABS: 5.0000 mg | ORAL_TABLET | Freq: Three times a day (TID) | ORAL | 0 refills | Status: DC | PRN

## 2020-11-18 MED ORDER — METHYLPREDNISOLONE ACETATE 80 MG/ML IJ SUSP
80.0000 mg | Freq: Once | INTRAMUSCULAR | Status: AC
  Administered 2020-11-18: 80 mg via INTRAMUSCULAR
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
## 2020-11-18 NOTE — Patient Instructions (Signed)
You may take the cyclobenzaprine (Flexeril) muscle relaxer, 1-2 tablets, every 8 hours as needed for muscle spasms/headaches. This may cause drowsiness.   Please update if your headaches persist despite treatment.  It was a pleasure to see you today!

## 2020-11-18 NOTE — Addendum Note (Signed)
Addended by: Donnamarie Poag on: 11/18/2020 12:01 PM   Modules accepted: Orders
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
## 2020-11-20 ENCOUNTER — Telehealth: Payer: Self-pay

## 2020-11-20 NOTE — Telephone Encounter (Signed)
Work note updated and attached to my chart.
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
## 2020-11-25 ENCOUNTER — Telehealth: Payer: Self-pay

## 2020-11-25 DIAGNOSIS — G4452 New daily persistent headache (NDPH): Secondary | ICD-10-CM

## 2020-11-25 DIAGNOSIS — G44209 Tension-type headache, unspecified, not intractable: Secondary | ICD-10-CM

## 2020-11-25 NOTE — Telephone Encounter (Signed)
Please see message and advise 
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
## 2020-11-26 NOTE — Telephone Encounter (Addendum)
Please thank her for these updates, I will put in an order for an MRI of the brain.  I am happy to refer her to neurology, please notify her that it may take weeks to months to get in.  Does she want to start with the MRI first?

## 2020-11-26 NOTE — Telephone Encounter (Signed)
Patient left another voicemail stating that she is still having headaches every day. Patient stated that she is taking the medication prescribed but it is not helping. Patient wants to know about a referral to a neurologist to make sure that nothing else is going on besides a concussion.

## 2020-11-26 NOTE — Addendum Note (Signed)
Addended by: Doreene Nest on: 11/26/2020 01:00 PM   Modules accepted: Orders

## 2020-11-26 NOTE — Telephone Encounter (Signed)
Spoke to patient she would like to get MRI set up. Let her know she will get a call from scheduling after insurace has been contacted. May take a week or more. She will let our office know if any changes or questions.

## 2020-11-27 NOTE — Telephone Encounter (Signed)
BCBS is covered  Per Medicaid the selected location is not covered.  Pt will have to go somewhere like [facility]  **This service is not eligible for coverage if rendered at an outpatient hospital.  The requested procedure is considered medically necessary, however the service is not eligible for coverage if rendered at an outpatient hospital. No additional information for the procedure is needed.  See Referral notes for more information

## 2020-11-27 NOTE — Telephone Encounter (Addendum)
Noted.  Amber [name], what's the wait time on the MRI I ordered? Any ideas?

## 2020-11-27 NOTE — Telephone Encounter (Signed)
Noted, will await medical board review from Medicaid.

## 2020-11-27 NOTE — Telephone Encounter (Signed)
Noted. I may need to change to stat order if it will be 2-3 weeks. A few days is fine but not a few weeks. Let  me know if I need to change it.

## 2020-12-03 NOTE — Telephone Encounter (Signed)
Thanks, have we notified the patient of this?

## 2020-12-03 NOTE — Telephone Encounter (Signed)
From what I understand Medicaid denied MRI, but can we use her BCBS to approve?

## 2020-12-04 DIAGNOSIS — L669 Cicatricial alopecia, unspecified: Secondary | ICD-10-CM | POA: Diagnosis not present

## 2020-12-04 DIAGNOSIS — L6681 Central centrifugal cicatricial alopecia: Secondary | ICD-10-CM | POA: Insufficient documentation

## 2020-12-04 DIAGNOSIS — L574 Cutis laxa senilis: Secondary | ICD-10-CM | POA: Diagnosis not present

## 2020-12-04 DIAGNOSIS — L668 Other cicatricial alopecia: Secondary | ICD-10-CM | POA: Insufficient documentation

## 2020-12-04 NOTE — Telephone Encounter (Signed)
Called patient to discuss. She was very pleasant and said that she is just having trouble understanding. She stated that could just be from her concussion where it takes a little more for her to comprehend things. I explained to her that in the past, when it comes to filing third party, that the patient is responsible for the visits with our office and then once they receive the bill, they forward them over to the true responsible party. Patient verbalized understanding. I told her that this is the first time I have had to deal with a referral where it would fall under third party, so I am not completely sure of what needs to be done to get her seen but I will investigate and make sure she gets taken care of. She states she wants the MRI for peace of mind as she previously had a stroke and wants to make sure everything looks okay. She thanked me for calling her back and for looking into this. She gave me the information for the adjuster as well as her claim number so I can start there and see what the process is. I attempted to reach the adjuster Norwood Levo) and left a voicemail asking her to call the office. If she returns call, please transfer to me.  Adjuster info: Norwood Levo with [employer] Phone Number: [phone] Claim Number: [id number]
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
## 2020-12-05 NOTE — Telephone Encounter (Signed)
Mrs. Amber [name] called in and wanted to know about getting a phone call back from the supervisor. Its about the MRI. And wanted to know about getting something to give to the cruise due to she was due to go on a cruise next week and she doesn't feel comfortable about going on there and the cruise stated that she needed something from the doctor and they will hold her deposit.

## 2020-12-18 ENCOUNTER — Ambulatory Visit (INDEPENDENT_AMBULATORY_CARE_PROVIDER_SITE_OTHER): Payer: BC Managed Care – PPO | Admitting: Primary Care

## 2020-12-18 ENCOUNTER — Encounter: Payer: Self-pay | Admitting: Primary Care

## 2020-12-18 ENCOUNTER — Telehealth: Payer: Self-pay | Admitting: Primary Care

## 2020-12-18 ENCOUNTER — Other Ambulatory Visit: Payer: Self-pay

## 2020-12-18 VITALS — BP 108/64 | HR 82 | Temp 97.8°F | Ht 62.0 in | Wt 173.0 lb

## 2020-12-18 DIAGNOSIS — R42 Dizziness and giddiness: Secondary | ICD-10-CM | POA: Insufficient documentation

## 2020-12-18 DIAGNOSIS — Z0000 Encounter for general adult medical examination without abnormal findings: Secondary | ICD-10-CM

## 2020-12-18 DIAGNOSIS — E785 Hyperlipidemia, unspecified: Secondary | ICD-10-CM | POA: Diagnosis not present

## 2020-12-18 DIAGNOSIS — I10 Essential (primary) hypertension: Secondary | ICD-10-CM | POA: Diagnosis not present

## 2020-12-18 DIAGNOSIS — Z8673 Personal history of transient ischemic attack (TIA), and cerebral infarction without residual deficits: Secondary | ICD-10-CM

## 2020-12-18 DIAGNOSIS — F411 Generalized anxiety disorder: Secondary | ICD-10-CM

## 2020-12-18 DIAGNOSIS — E119 Type 2 diabetes mellitus without complications: Secondary | ICD-10-CM

## 2020-12-18 DIAGNOSIS — K219 Gastro-esophageal reflux disease without esophagitis: Secondary | ICD-10-CM

## 2020-12-18 DIAGNOSIS — G44209 Tension-type headache, unspecified, not intractable: Secondary | ICD-10-CM

## 2020-12-18 LAB — LIPID PANEL
Cholesterol: 196 mg/dL (ref 0–200)
HDL: 83.9 mg/dL (ref >39.00)
LDL Cholesterol: 100 mg/dL — ABNORMAL HIGH (ref 0–99)
NonHDL: 111.95
Total CHOL/HDL Ratio: 2
Triglycerides: 58 mg/dL (ref 0.0–149.0)
VLDL: 11.6 mg/dL (ref 0.0–40.0)

## 2020-12-18 LAB — HEMOGLOBIN A1C: Hgb A1c MFr Bld: 6.5 % (ref 4.6–6.5)

## 2020-12-18 NOTE — Assessment & Plan Note (Addendum)
Well controlled in the office today, but has been lightheadedness and dizziness. Will have her stop HCTZ 12.5 mg daily and monitor. She will update in a few weeks.   BMP reviewed from April 2022.

## 2020-12-18 NOTE — Assessment & Plan Note (Signed)
Doing well on pantoprazole 40 mg PRN, continue same.

## 2020-12-18 NOTE — Assessment & Plan Note (Addendum)
Well controlled with A1C in April 2022 of 6.4.  Continue metformin XR 500 mg daily.  She is interested in starting a weekly injectable medication for diabetes and weight loss, she will notify us when she can recall the name of the medication.   Managed on statin. Urine microalbumin negative and UTD. Eye exam due, she will schedule.  Foot exam UTD.  Repeat A1C pending.

## 2020-12-18 NOTE — Patient Instructions (Signed)
Stop by the lab prior to leaving today. I will notify you of your results once received.   Stop your hydrochlorothiazide blood pressure medication for now. Monitor your blood pressure over the next few weeks, notify me if you see readings consistently at or above 140 on top or 90 on bottom.  Please schedule a follow up visit for 6 months for a diabetes check.  It was a pleasure to see you today!  Preventive Care 34-48 Years Old, Female Preventive care refers to lifestyle choices and visits with your health care provider that can promote health and wellness. This includes: A yearly physical exam. This is also called an annual wellness visit. Regular dental and eye exams. Immunizations. Screening for certain conditions. Healthy lifestyle choices, such as: Eating a healthy diet. Getting regular exercise. Not using drugs or products that contain nicotine and tobacco. Limiting alcohol use. What can I expect for my preventive care visit? Physical exam Your health care provider will check your: Height and weight. These may be used to calculate your BMI (body mass index). BMI is a measurement that tells if you are at a healthy weight. Heart rate and blood pressure. Body temperature. Skin for abnormal spots. Counseling Your health care provider may ask you questions about your: Past medical problems. Family's medical history. Alcohol, tobacco, and drug use. Emotional well-being. Home life and relationship well-being. Sexual activity. Diet, exercise, and sleep habits. Work and work Statistician. Access to firearms. Method of birth control. Menstrual cycle. Pregnancy history. What immunizations do I need?  Vaccines are usually given at various ages, according to a schedule. Your health care provider will recommend vaccines for you based on your age, medicalhistory, and lifestyle or other factors, such as travel or where you work. What tests do I need? Blood tests Lipid and  cholesterol levels. These may be checked every 5 years, or more often if you are over 49 years old. Hepatitis C test. Hepatitis B test. Screening Lung cancer screening. You may have this screening every year starting at age 60 if you have a 30-pack-year history of smoking and currently smoke or have quit within the past 15 years. Colorectal cancer screening. All adults should have this screening starting at age 66 and continuing until age 33. Your health care provider may recommend screening at age 87 if you are at increased risk. You will have tests every 1-10 years, depending on your results and the type of screening test. Diabetes screening. This is done by checking your blood sugar (glucose) after you have not eaten for a while (fasting). You may have this done every 1-3 years. Mammogram. This may be done every 1-2 years. Talk with your health care provider about when you should start having regular mammograms. This may depend on whether you have a family history of breast cancer. BRCA-related cancer screening. This may be done if you have a family history of breast, ovarian, tubal, or peritoneal cancers. Pelvic exam and Pap test. This may be done every 3 years starting at age 52. Starting at age 46, this may be done every 5 years if you have a Pap test in combination with an HPV test. Other tests STD (sexually transmitted disease) testing, if you are at risk. Bone density scan. This is done to screen for osteoporosis. You may have this scan if you are at high risk for osteoporosis. Talk with your health care provider about your test results, treatment options,and if necessary, the need for more tests. Follow these instructions  at home: Eating and drinking  Eat a diet that includes fresh fruits and vegetables, whole grains, lean protein, and low-fat dairy products. Take vitamin and mineral supplements as recommended by your health care provider. Do not drink alcohol if: Your health  care provider tells you not to drink. You are pregnant, may be pregnant, or are planning to become pregnant. If you drink alcohol: Limit how much you have to 0-1 drink a day. Be aware of how much alcohol is in your drink. In the [place] one drink equals one 12 oz bottle of beer (355 mL), one 5 oz glass of wine (148 mL), or one 1 oz glass of hard liquor (44 mL).  Lifestyle Take daily care of your teeth and gums. Brush your teeth every morning and night with fluoride toothpaste. Floss one time each day. Stay active. Exercise for at least 30 minutes 5 or more days each week. Do not use any products that contain nicotine or tobacco, such as cigarettes, e-cigarettes, and chewing tobacco. If you need help quitting, ask your health care provider. Do not use drugs. If you are sexually active, practice safe sex. Use a condom or other form of protection to prevent STIs (sexually transmitted infections). If you do not wish to become pregnant, use a form of birth control. If you plan to become pregnant, see your health care provider for a prepregnancy visit. If told by your health care provider, take low-dose aspirin daily starting at age 59. Find healthy ways to cope with stress, such as: Meditation, yoga, or listening to music. Journaling. Talking to a trusted person. Spending time with friends and family. Safety Always wear your seat belt while driving or riding in a vehicle. Do not drive: If you have been drinking alcohol. Do not ride with someone who has been drinking. When you are tired or distracted. While texting. Wear a helmet and other protective equipment during sports activities. If you have firearms in your house, make sure you follow all gun safety procedures. What's next? Visit your health care provider once a year for an annual wellness visit. Ask your health care provider how often you should have your eyes and teeth checked. Stay up to date on all vaccines. This information is not  intended to replace advice given to you by your health care provider. Make sure you discuss any questions you have with your healthcare provider. Document Revised: 02/19/2020 Document Reviewed: 01/26/2018 Elsevier Patient Education  2022 [employer].
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
## 2020-12-18 NOTE — Assessment & Plan Note (Signed)
Immunizations UTD. Mammogram overdue, she would like to get this at her GYN office in [place].  Colonoscopy UTD, due in 2027.  Discussed the importance of a healthy diet and regular exercise in order for weight loss, and to reduce the risk of further co-morbidity.  Exam today stable. Labs pending and also reviewed.

## 2020-12-18 NOTE — Telephone Encounter (Signed)
Attempted to reach Amber [name] again to discuss. Lvm asking her to give the office a call. Her voicemail stated she was out of the office until today. I relayed message to patient and she verbalized understanding. She states she has not been able to get in contact with Dina either. She is wondering if there is any way to proceed with the MRI without having to go through the 3rd party insurance. Please advise.

## 2020-12-18 NOTE — Assessment & Plan Note (Signed)
Compliant to atorvastatin 80 mg, goal LDL of <70. Repeat lipid panel pending.

## 2020-12-18 NOTE — Telephone Encounter (Signed)
Mrs. Uplinger called and wanted to speak to Irving Burton wanted to update her on what is going on

## 2020-12-18 NOTE — Assessment & Plan Note (Signed)
No new symptoms.  Neuro exam today unremarkable. Repeat lipid panel pending, goal LDL <70.
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
## 2020-12-18 NOTE — Assessment & Plan Note (Signed)
Improved, still occurring daily. Neuro exam today negative.   Agree to have her resume work. Agree to write letter to excuse her from her cruise.   She will update if symptoms do not continue to improve.

## 2020-12-19 ENCOUNTER — Telehealth: Payer: Self-pay

## 2020-12-19 NOTE — Telephone Encounter (Signed)
Please notify patient that I have not yet reviewed her results and that we will be in touch as soon as I can take a look.

## 2020-12-19 NOTE — Telephone Encounter (Signed)
Patient informed if any further questions she will call

## 2020-12-19 NOTE — Telephone Encounter (Signed)
Patient called this morning wanting to know about her lab results she had done yesterday. She stated she can not see them on Mychart yet and wanted to know if they were back yet. I saw results in EMR but not resulted yet.

## 2020-12-19 NOTE — Telephone Encounter (Signed)
Pt called back. She will check with [employer] and get back with [place].

## 2020-12-23 ENCOUNTER — Telehealth: Payer: Self-pay

## 2020-12-23 ENCOUNTER — Encounter: Payer: BC Managed Care – PPO | Admitting: Primary Care

## 2020-12-23 NOTE — Telephone Encounter (Signed)
Called patient verified that she has appointment time and date.

## 2020-12-23 NOTE — Telephone Encounter (Signed)
New message    MRI Approval was done on 6.30.2022 . End date MRI approval is  7.29.2022 - issued for 30 day time frame   Patient was in the office 7.21.2022 for physical to see Amber [name] was told to call her insurance company.  Checking on the status when the MRI will be schedule

## 2020-12-23 NOTE — Telephone Encounter (Signed)
Following up on this.

## 2020-12-26 ENCOUNTER — Ambulatory Visit
Admission: RE | Admit: 2020-12-26 | Discharge: 2020-12-26 | Disposition: A | Discharge status: Home / Self Care | Payer: BC Managed Care – PPO | Source: Ambulatory Visit | Attending: Primary Care | Admitting: Primary Care

## 2020-12-26 ENCOUNTER — Other Ambulatory Visit: Payer: Self-pay

## 2020-12-26 DIAGNOSIS — G44209 Tension-type headache, unspecified, not intractable: Secondary | ICD-10-CM

## 2020-12-26 DIAGNOSIS — G4452 New daily persistent headache (NDPH): Secondary | ICD-10-CM

## 2020-12-29 ENCOUNTER — Telehealth: Payer: Self-pay | Admitting: *Deleted

## 2020-12-29 DIAGNOSIS — E785 Hyperlipidemia, unspecified: Secondary | ICD-10-CM

## 2020-12-29 NOTE — Telephone Encounter (Signed)
Patient left a voicemail stating that her cholesterol medication was suppose to be changed to something different. Patient stated that she checked with [facility] and they do not have a new script there for her. Patient wants the new script sent in for her and she would like a call back to let her know the name of the new medication.

## 2020-12-30 MED ORDER — ROSUVASTATIN CALCIUM 40 MG PO TABS: 40.0000 mg | ORAL_TABLET | Freq: Every day | ORAL | 3 refills | Status: DC

## 2020-12-30 NOTE — Telephone Encounter (Signed)
Yes, switching from atorvastatin 80 mg to rosuvastatin 40 mg. She was not on rosuvastatin previously. My lab note asks her if she'd be willing to switch to rosuvastatin.   Will review MRI results later today or tomorrow.

## 2020-12-30 NOTE — Telephone Encounter (Signed)
Called patient reviewed all information and repeated back to me. Will call if any questions.  Patient ok for change let her know has been called in. Informed that we will call with MRI results. No further action needed.

## 2020-12-30 NOTE — Telephone Encounter (Signed)
Called patient reviewed all information and repeated back to me. Will call if any questions.    Want to make sure your lab note said you were starting on new medication but she was on Crestor this was just increased to 40 mg. Patient also wanted to follow up on MRI. Let her know has been received but not reviewed and I would giver her a call with information. She is not able to get in my chart.

## 2020-12-30 NOTE — Telephone Encounter (Signed)
Pt had left v/m earlier requesting MRI results. Pt has already spoken with Park Meo CMA and note sent to Allayne Gitelman NP.

## 2020-12-30 NOTE — Telephone Encounter (Signed)
Please notify patient that prescription for rosuvastatin (crestor) was sent to [facility] on [address] This morning. Please apologize on my behalf for the delay.

## 2020-12-31 ENCOUNTER — Telehealth: Payer: Self-pay | Admitting: *Deleted

## 2020-12-31 ENCOUNTER — Other Ambulatory Visit: Payer: Self-pay | Admitting: Primary Care

## 2020-12-31 DIAGNOSIS — G44209 Tension-type headache, unspecified, not intractable: Secondary | ICD-10-CM

## 2020-12-31 DIAGNOSIS — Z8673 Personal history of transient ischemic attack (TIA), and cerebral infarction without residual deficits: Secondary | ICD-10-CM

## 2020-12-31 NOTE — Telephone Encounter (Signed)
Patient called requesting the results of her MRI.

## 2020-12-31 NOTE — Telephone Encounter (Signed)
See result note.  

## 2021-01-01 ENCOUNTER — Other Ambulatory Visit: Payer: Self-pay | Admitting: Primary Care

## 2021-01-01 DIAGNOSIS — Z8673 Personal history of transient ischemic attack (TIA), and cerebral infarction without residual deficits: Secondary | ICD-10-CM

## 2021-01-01 DIAGNOSIS — G44209 Tension-type headache, unspecified, not intractable: Secondary | ICD-10-CM

## 2021-01-02 ENCOUNTER — Emergency Department: Payer: BC Managed Care – PPO

## 2021-01-02 ENCOUNTER — Inpatient Hospital Stay
Admission: EM | Admit: 2021-01-02 | Discharge: 2021-01-07 | DRG: 063 | Disposition: A | Discharge status: Rehabilitation Facility | Payer: BC Managed Care – PPO | Attending: Internal Medicine | Admitting: Internal Medicine

## 2021-01-02 ENCOUNTER — Other Ambulatory Visit: Payer: Self-pay

## 2021-01-02 ENCOUNTER — Inpatient Hospital Stay: Payer: BC Managed Care – PPO

## 2021-01-02 ENCOUNTER — Encounter: Payer: Self-pay | Admitting: Radiology

## 2021-01-02 DIAGNOSIS — Z6832 Body mass index (BMI) 32.0-32.9, adult: Secondary | ICD-10-CM

## 2021-01-02 DIAGNOSIS — E785 Hyperlipidemia, unspecified: Secondary | ICD-10-CM

## 2021-01-02 DIAGNOSIS — E119 Type 2 diabetes mellitus without complications: Secondary | ICD-10-CM | POA: Diagnosis present

## 2021-01-02 DIAGNOSIS — R4781 Slurred speech: Secondary | ICD-10-CM | POA: Diagnosis present

## 2021-01-02 DIAGNOSIS — G44209 Tension-type headache, unspecified, not intractable: Secondary | ICD-10-CM | POA: Diagnosis present

## 2021-01-02 DIAGNOSIS — Z8673 Personal history of transient ischemic attack (TIA), and cerebral infarction without residual deficits: Secondary | ICD-10-CM | POA: Diagnosis present

## 2021-01-02 DIAGNOSIS — R4701 Aphasia: Secondary | ICD-10-CM

## 2021-01-02 DIAGNOSIS — Z833 Family history of diabetes mellitus: Secondary | ICD-10-CM | POA: Diagnosis not present

## 2021-01-02 DIAGNOSIS — I340 Nonrheumatic mitral (valve) insufficiency: Secondary | ICD-10-CM | POA: Diagnosis not present

## 2021-01-02 DIAGNOSIS — R29702 NIHSS score 2: Secondary | ICD-10-CM | POA: Diagnosis not present

## 2021-01-02 DIAGNOSIS — F32A Depression, unspecified: Secondary | ICD-10-CM | POA: Diagnosis present

## 2021-01-02 DIAGNOSIS — R29706 NIHSS score 6: Secondary | ICD-10-CM | POA: Diagnosis not present

## 2021-01-02 DIAGNOSIS — R29810 Facial weakness: Secondary | ICD-10-CM | POA: Diagnosis present

## 2021-01-02 DIAGNOSIS — Z7984 Long term (current) use of oral hypoglycemic drugs: Secondary | ICD-10-CM

## 2021-01-02 DIAGNOSIS — Z83438 Family history of other disorder of lipoprotein metabolism and other lipidemia: Secondary | ICD-10-CM | POA: Diagnosis not present

## 2021-01-02 DIAGNOSIS — Z90710 Acquired absence of both cervix and uterus: Secondary | ICD-10-CM | POA: Diagnosis not present

## 2021-01-02 DIAGNOSIS — J45909 Unspecified asthma, uncomplicated: Secondary | ICD-10-CM | POA: Diagnosis present

## 2021-01-02 DIAGNOSIS — Z79899 Other long term (current) drug therapy: Secondary | ICD-10-CM

## 2021-01-02 DIAGNOSIS — F419 Anxiety disorder, unspecified: Secondary | ICD-10-CM | POA: Diagnosis present

## 2021-01-02 DIAGNOSIS — I639 Cerebral infarction, unspecified: Principal | ICD-10-CM

## 2021-01-02 DIAGNOSIS — Z8249 Family history of ischemic heart disease and other diseases of the circulatory system: Secondary | ICD-10-CM | POA: Diagnosis not present

## 2021-01-02 DIAGNOSIS — Z823 Family history of stroke: Secondary | ICD-10-CM | POA: Diagnosis not present

## 2021-01-02 DIAGNOSIS — R29707 NIHSS score 7: Secondary | ICD-10-CM | POA: Diagnosis present

## 2021-01-02 DIAGNOSIS — R29703 NIHSS score 3: Secondary | ICD-10-CM | POA: Diagnosis not present

## 2021-01-02 DIAGNOSIS — E118 Type 2 diabetes mellitus with unspecified complications: Secondary | ICD-10-CM | POA: Diagnosis not present

## 2021-01-02 DIAGNOSIS — G441 Vascular headache, not elsewhere classified: Secondary | ICD-10-CM | POA: Diagnosis not present

## 2021-01-02 DIAGNOSIS — Z20822 Contact with and (suspected) exposure to covid-19: Secondary | ICD-10-CM | POA: Diagnosis present

## 2021-01-02 DIAGNOSIS — G8321 Monoplegia of upper limb affecting right dominant side: Secondary | ICD-10-CM | POA: Diagnosis present

## 2021-01-02 DIAGNOSIS — I6389 Other cerebral infarction: Secondary | ICD-10-CM | POA: Diagnosis not present

## 2021-01-02 DIAGNOSIS — E1159 Type 2 diabetes mellitus with other circulatory complications: Secondary | ICD-10-CM | POA: Diagnosis not present

## 2021-01-02 DIAGNOSIS — Z7982 Long term (current) use of aspirin: Secondary | ICD-10-CM

## 2021-01-02 DIAGNOSIS — E1165 Type 2 diabetes mellitus with hyperglycemia: Secondary | ICD-10-CM

## 2021-01-02 DIAGNOSIS — R29704 NIHSS score 4: Secondary | ICD-10-CM | POA: Diagnosis present

## 2021-01-02 DIAGNOSIS — Z881 Allergy status to other antibiotic agents status: Secondary | ICD-10-CM

## 2021-01-02 DIAGNOSIS — I6300 Cerebral infarction due to thrombosis of unspecified precerebral artery: Secondary | ICD-10-CM | POA: Diagnosis not present

## 2021-01-02 DIAGNOSIS — Q254 Congenital malformation of aorta unspecified: Secondary | ICD-10-CM

## 2021-01-02 DIAGNOSIS — R252 Cramp and spasm: Secondary | ICD-10-CM | POA: Diagnosis not present

## 2021-01-02 DIAGNOSIS — M79606 Pain in leg, unspecified: Secondary | ICD-10-CM | POA: Diagnosis not present

## 2021-01-02 HISTORY — DX: Type 2 diabetes mellitus without complications: E11.9

## 2021-01-02 LAB — CBG MONITORING, ED: Glucose-Capillary: 123 mg/dL — ABNORMAL HIGH (ref 70–99)

## 2021-01-02 LAB — LIPID PANEL
Cholesterol: 181 mg/dL (ref 0–200)
HDL: 76 mg/dL (ref >40)
LDL Cholesterol: 86 mg/dL (ref 0–99)
Total CHOL/HDL Ratio: 2.4 RATIO
Triglycerides: 94 mg/dL (ref <150)
VLDL: 19 mg/dL (ref 0–40)

## 2021-01-02 LAB — RESP PANEL BY RT-PCR (FLU A&B, COVID) ARPGX2
Influenza A by PCR: NEGATIVE
Influenza B by PCR: NEGATIVE
SARS Coronavirus 2 by RT PCR: NEGATIVE

## 2021-01-02 LAB — SEDIMENTATION RATE: Sed Rate: 5 mm/hr (ref 0–20)

## 2021-01-02 LAB — URINE DRUG SCREEN, QUALITATIVE (ARMC ONLY)
Amphetamines, Ur Screen: NOT DETECTED
Barbiturates, Ur Screen: NOT DETECTED
Benzodiazepine, Ur Scrn: NOT DETECTED
Cannabinoid 50 Ng, Ur ~~LOC~~: NOT DETECTED
Cocaine Metabolite,Ur ~~LOC~~: NOT DETECTED
MDMA (Ecstasy)Ur Screen: NOT DETECTED
Methadone Scn, Ur: NOT DETECTED
Opiate, Ur Screen: NOT DETECTED
Phencyclidine (PCP) Ur S: NOT DETECTED
Tricyclic, Ur Screen: NOT DETECTED

## 2021-01-02 LAB — PROTIME-INR
INR: 1.1 (ref 0.8–1.2)
Prothrombin Time: 14 seconds (ref 11.4–15.2)

## 2021-01-02 LAB — COMPREHENSIVE METABOLIC PANEL
ALT: 28 U/L (ref 0–44)
AST: 21 U/L (ref 15–41)
Albumin: 3.7 g/dL (ref 3.5–5.0)
Alkaline Phosphatase: 74 U/L (ref 38–126)
Anion gap: 9 (ref 5–15)
BUN: 10 mg/dL (ref 6–20)
CO2: 26 mmol/L (ref 22–32)
Calcium: 9.9 mg/dL (ref 8.9–10.3)
Chloride: 106 mmol/L (ref 98–111)
Creatinine, Ser: 0.89 mg/dL (ref 0.44–1.00)
GFR, Estimated: >60 mL/min (ref >60)
Glucose, Bld: 98 mg/dL (ref 70–99)
Potassium: 4 mmol/L (ref 3.5–5.1)
Sodium: 141 mmol/L (ref 135–145)
Total Bilirubin: 0.7 mg/dL (ref 0.3–1.2)
Total Protein: 7.5 g/dL (ref 6.5–8.1)

## 2021-01-02 LAB — DIFFERENTIAL
Abs Immature Granulocytes: 0.03 10*3/uL (ref 0.00–0.07)
Basophils Absolute: 0.1 10*3/uL (ref 0.0–0.1)
Basophils Relative: 1 %
Eosinophils Absolute: 0.1 10*3/uL (ref 0.0–0.5)
Eosinophils Relative: 1 %
Immature Granulocytes: 0 %
Lymphocytes Relative: 23 %
Lymphs Abs: 2.2 10*3/uL (ref 0.7–4.0)
Monocytes Absolute: 0.7 10*3/uL (ref 0.1–1.0)
Monocytes Relative: 7 %
Neutro Abs: 6.5 10*3/uL (ref 1.7–7.7)
Neutrophils Relative %: 68 %

## 2021-01-02 LAB — APTT: aPTT: 32 seconds (ref 24–36)

## 2021-01-02 LAB — CBC
HCT: 43.4 % (ref 36.0–46.0)
Hemoglobin: 14.2 g/dL (ref 12.0–15.0)
MCH: 28.7 pg (ref 26.0–34.0)
MCHC: 32.7 g/dL (ref 30.0–36.0)
MCV: 87.9 fL (ref 80.0–100.0)
Platelets: 210 10*3/uL (ref 150–400)
RBC: 4.94 MIL/uL (ref 3.87–5.11)
RDW: 14.4 % (ref 11.5–15.5)
WBC: 9.5 10*3/uL (ref 4.0–10.5)
nRBC: 0 % (ref 0.0–0.2)

## 2021-01-02 LAB — HEMOGLOBIN A1C
Hgb A1c MFr Bld: 6.2 % — ABNORMAL HIGH (ref 4.8–5.6)
Mean Plasma Glucose: 131.24 mg/dL

## 2021-01-02 MED ORDER — STROKE: EARLY STAGES OF RECOVERY BOOK
Freq: Once | Status: AC
Start: 2021-01-02 — End: 2021-01-04
  Administered 2021-01-04: 22:00:00 1

## 2021-01-02 MED ORDER — ONDANSETRON HCL 4 MG/2ML IJ SOLN: 4.0000 mg | Freq: Four times a day (QID) | INTRAMUSCULAR | Status: DC | PRN

## 2021-01-02 MED ORDER — POLYETHYLENE GLYCOL 3350 17 G PO PACK: 17.0000 g | PACK | Freq: Every day | ORAL | Status: DC | PRN

## 2021-01-02 MED ORDER — VITAMIN D 25 MCG (1000 UNIT) PO TABS
1000.0000 [IU] | ORAL_TABLET | Freq: Every day | ORAL | Status: DC
  Administered 2021-01-03 – 2021-01-07 (×5): 1000 [IU] via ORAL
  Filled 2021-01-02 (×5): qty 1

## 2021-01-02 MED ORDER — SODIUM CHLORIDE 0.9 % IV SOLN
50.0000 mL | Freq: Once | INTRAVENOUS | Status: AC
  Administered 2021-01-02: 50 mL via INTRAVENOUS

## 2021-01-02 MED ORDER — ALBUTEROL SULFATE (2.5 MG/3ML) 0.083% IN NEBU: 2.5000 mg | INHALATION_SOLUTION | RESPIRATORY_TRACT | Status: DC | PRN

## 2021-01-02 MED ORDER — ASCORBIC ACID 500 MG PO TABS
1000.0000 mg | ORAL_TABLET | Freq: Every day | ORAL | Status: DC
  Administered 2021-01-03 – 2021-01-07 (×5): 1000 mg via ORAL
  Filled 2021-01-02 (×5): qty 2

## 2021-01-02 MED ORDER — ALBUTEROL SULFATE HFA 108 (90 BASE) MCG/ACT IN AERS: 2.0000 | INHALATION_SPRAY | RESPIRATORY_TRACT | Status: DC | PRN

## 2021-01-02 MED ORDER — ROSUVASTATIN CALCIUM 20 MG PO TABS
40.0000 mg | ORAL_TABLET | Freq: Every day | ORAL | Status: DC
  Administered 2021-01-03 – 2021-01-07 (×5): 40 mg via ORAL
  Filled 2021-01-02 (×4): qty 2
  Filled 2021-01-02: qty 4
  Filled 2021-01-02: qty 2

## 2021-01-02 MED ORDER — ALTEPLASE (STROKE) FULL DOSE INFUSION
0.9000 mg/kg | Freq: Once | INTRAVENOUS | Status: AC
  Administered 2021-01-02: 73.1 mg via INTRAVENOUS
  Filled 2021-01-02: qty 100

## 2021-01-02 MED ORDER — IOHEXOL 350 MG/ML SOLN
100.0000 mL | Freq: Once | INTRAVENOUS | Status: AC | PRN
  Administered 2021-01-02: 100 mL via INTRAVENOUS

## 2021-01-02 MED ORDER — SODIUM CHLORIDE 0.9% FLUSH: 3.0000 mL | Freq: Once | INTRAVENOUS | Status: DC

## 2021-01-02 MED ORDER — DOCUSATE SODIUM 100 MG PO CAPS: 100.0000 mg | ORAL_CAPSULE | Freq: Two times a day (BID) | ORAL | Status: DC | PRN

## 2021-01-02 MED ORDER — MORPHINE SULFATE (PF) 2 MG/ML IV SOLN
2.0000 mg | Freq: Once | INTRAVENOUS | Status: AC
  Administered 2021-01-02: 2 mg via INTRAVENOUS
  Filled 2021-01-02: qty 1

## 2021-01-02 MED ORDER — OXYCODONE HCL 5 MG PO TABS
5.0000 mg | ORAL_TABLET | ORAL | Status: DC | PRN
  Administered 2021-01-02 – 2021-01-06 (×4): 5 mg via ORAL
  Filled 2021-01-02 (×4): qty 1

## 2021-01-02 MED ORDER — PANTOPRAZOLE SODIUM 40 MG IV SOLR
40.0000 mg | Freq: Every day | INTRAVENOUS | Status: DC
  Administered 2021-01-02 – 2021-01-06 (×5): 40 mg via INTRAVENOUS
  Filled 2021-01-02 (×5): qty 40

## 2021-01-02 MED ORDER — CLEVIDIPINE BUTYRATE 0.5 MG/ML IV EMUL
0.0000 mg/h | INTRAVENOUS | Status: DC
  Administered 2021-01-02: 1 mg/h via INTRAVENOUS
  Filled 2021-01-02 (×2): qty 50

## 2021-01-02 MED ORDER — ADULT MULTIVITAMIN W/MINERALS CH
1.0000 | ORAL_TABLET | Freq: Every day | ORAL | Status: DC
  Administered 2021-01-03 – 2021-01-07 (×5): 1 via ORAL
  Filled 2021-01-02 (×5): qty 1

## 2021-01-02 MED ORDER — ACETAMINOPHEN 325 MG PO TABS
650.0000 mg | ORAL_TABLET | ORAL | Status: DC | PRN
  Administered 2021-01-02: 650 mg via ORAL
  Filled 2021-01-02: qty 2

## 2021-01-02 NOTE — ED Notes (Signed)
To MRI from CT, provider at CT evaluating patient.

## 2021-01-02 NOTE — ED Notes (Signed)
Patient transported to CCU by this RN.

## 2021-01-02 NOTE — ED Notes (Signed)
Swallow screen performed prior to administration of PO meds. Pt passed.

## 2021-01-02 NOTE — Progress Notes (Addendum)
PHARMACY CONSULT NOTE - FOLLOW UP  Pharmacy Consult for Electrolyte Monitoring and Replacement   Recent Labs: Potassium (mmol/L)  Date Value  01/02/2021 4.0   Calcium (mg/dL)  Date Value  58/01/9832 9.9   Albumin (g/dL)  Date Value  82/50/5397 3.7  05/12/2016 4.3   Sodium (mmol/L)  Date Value  01/02/2021 141  12/17/2019 143    Assessment: Patient admitted to ED and CODE stroke initiated. tPA given after CTA and order received from neurology.  Pharmacy consult was placed to replace electrolytes per protocol. As results provided above, no need to replace electrolytes at this time. Noted BMP, Mg, and Phos already ordered for tomorrow morning.  Goal of Therapy:  Within normal limits  Plan:  -No changes at this time -Follow up lab results and replace electrolytes as needed  Arnelle Nale Rodriguez-Guzman PharmD, BCPS 01/02/2021 4:16 PM
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
## 2021-01-02 NOTE — ED Notes (Signed)
Pt's watch and purse given to family at this time

## 2021-01-02 NOTE — ED Notes (Signed)
Back from MRI.

## 2021-01-02 NOTE — Progress Notes (Signed)
SLP Cancellation Note  Patient Details Name: Amber [name] MRN: [id number] DOB: 03-24-1971   Cancelled treatment:       Reason Eval/Treat Not Completed: Patient at procedure or test/unavailable  Pt receiving tPA and is out of room at MRI.  [name] B. Dreama Saa M.S., CCC-SLP, [facility] Speech-Language Pathologist Rehabilitation Services Office [phone]  [name] 01/02/2021, 3:11 PM

## 2021-01-02 NOTE — ED Notes (Signed)
To CT

## 2021-01-02 NOTE — ED Provider Notes (Signed)
[facility] Emergency Department Provider Note    Event Date/Time   First MD Initiated Contact with Patient 01/02/21 1144     (approximate)  I have reviewed the triage vital signs and the nursing notes.   HISTORY  Chief Complaint Code Stroke    HPI Amber [name] is a 50 y.o. female with the below listed past medical history presents to the ER for evaluation of difficulty finding words and with speech as well as right facial droop.  Symptoms started shortly prior to arrival at 1110.  Was brought immediately to the ER by her daughter.  Does have a remote history of CVA.  Not on anticoagulation.  Denies any chest pain or back pain.  No neck pain.  No recent fevers cough or congestion.  Past Medical History:  Diagnosis Date   Anxiety    self reported   Asthma    Depression    controlled   Hyperlipidemia    controlled with medication   IBS (irritable bowel syndrome)    Stroke ([place]) 03-13-2015   Tension headache 11/18/2020   Family History  Problem Relation Age of Onset   Arthritis Father    Diabetes Father    Hypertension Father    Hyperlipidemia Father    Prostate cancer Father    Hyperlipidemia Mother    Stroke Paternal Grandmother    Heart attack Maternal Grandfather    Hyperlipidemia Maternal Grandfather    Prostate cancer Maternal Grandfather    Heart attack Paternal Grandfather    Prostate cancer Maternal Uncle        x 3   Colon cancer Neg Hx    Colon polyps Neg Hx    Stomach cancer Neg Hx    Esophageal cancer Neg Hx    Pancreatic cancer Neg Hx    Past Surgical History:  Procedure Laterality Date   ABDOMINAL HYSTERECTOMY  10/2006   bowel reconstruction  10/2006   with hysterectomy   CESAREAN SECTION  2005   EP IMPLANTABLE DEVICE N/A 03/17/2015   Procedure: Loop Recorder Insertion;  Surgeon: Will Meredith Leeds, MD;  Location: [place] [facility];  Service: Cardiovascular;  Laterality: N/A;   TEE WITHOUT CARDIOVERSION N/A  03/17/2015   Procedure: TRANSESOPHAGEAL ECHOCARDIOGRAM (TEE);  Surgeon: Skeet Latch, MD;  Location: [place];  Service: Cardiovascular;  Laterality: N/A;   TOE SURGERY     Left 2nd metatarsal   Patient Active Problem List   Diagnosis Date Noted   Acute CVA (cerebrovascular accident) ([place]) 01/02/2021   Dizziness 12/18/2020   Tension headache 11/18/2020   Acute neck pain 11/18/2020   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   Splinter in skin, left foot 12/11/2019   Allergic rhinitis due to pollen 10/15/2019   Concentration deficit 08/14/2019   Non-cardiac chest pain 05/18/2019   Anxiety 04/13/2019   Weight 03/06/2019   Right flank pain 11/22/2018   Left shoulder pain 08/02/2018   Word finding difficulty 02/27/2018   Restless leg syndrome 12/29/2016   GERD (gastroesophageal reflux disease) 08/24/2016   Hemorrhoids 08/24/2016   Healthcare maintenance 08/24/2016   GAD (generalized anxiety disorder) 06/22/2016   Levoscoliosis 10/06/2015   Type 2 diabetes mellitus ([place]) 09/08/2015   Fatigue 04/03/2015   History of stroke 03/14/2015   IBS (irritable bowel syndrome) 09/29/2014   Hyperlipidemia 09/29/2014   Essential hypertension 09/29/2014      Prior to Admission medications   Medication Sig Start Date End Date Taking? Authorizing Provider  albuterol (VENTOLIN HFA) 108 (90  Base) MCG/ACT inhaler Inhale 2 puffs into the lungs every 4 (four) hours as needed for wheezing or shortness of breath (cough, shortness of breath or wheezing.). 05/07/20   Elby Beck, FNP  Ascorbic Acid (VITAMIN C) 1000 MG tablet Take 1,000 mg by mouth daily.    [provider]  aspirin EC 81 MG tablet Take 81 mg by mouth daily.    [provider]  azelastine (ASTELIN) 0.1 % nasal spray Place 1 spray into both nostrils 2 (two) times daily. Use in each nostril as directed 10/03/20   Maximiano Coss, NP  blood glucose meter kit and supplies KIT Dispense based on patient and insurance  preference. Use up to four times daily as directed. (FOR ICD-9 250.00, 250.01). 06/20/20   Pleas Koch, NP  cholecalciferol (VITAMIN D3) 25 MCG (1000 UT) tablet Take 1,000 Units by mouth daily.    [provider]  clobetasol (OLUX) 0.05 % topical foam Apply to affected area on scalp three times a week. Never to face, underarms, or genital region. 12/04/20   [provider]  Continuous Blood Gluc Receiver (FREESTYLE LIBRE 14 DAY READER) DEVI Use with sensor to check blood sugar. 09/08/20   Pleas Koch, NP  Continuous Blood Gluc Sensor (FREESTYLE LIBRE 14 DAY SENSOR) MISC Use as directed to check blood sugars. 09/08/20   Pleas Koch, NP  cyclobenzaprine (FLEXERIL) 5 MG tablet Take 1-2 tablets (5-10 mg total) by mouth 3 (three) times daily as needed for muscle spasms. 11/18/20   Pleas Koch, NP  Elderberry 575 MG/5ML SYRP Take 2 mg/mL by mouth.    [provider]  hydrochlorothiazide (HYDRODIURIL) 12.5 MG tablet Take 12.5 mg by mouth daily. 09/16/20   [provider]  metFORMIN (GLUCOPHAGE XR) 500 MG 24 hr tablet Take 1 tablet (500 mg total) by mouth daily with breakfast. For diabetes. 03/17/20   Pleas Koch, NP  Multiple Vitamins-Minerals (MULTIVITAMIN WITH MINERALS) tablet Take 1 tablet by mouth daily.    [provider]  pantoprazole (PROTONIX) 40 MG tablet Take 1 tablet (40 mg total) by mouth daily. 01/23/20   Oda Kilts, MD  rosuvastatin (CRESTOR) 40 MG tablet Take 1 tablet (40 mg total) by mouth daily. For cholesterol. 12/30/20   Pleas Koch, NP    Allergies Keflex [cephalexin]    Social History Social History   Tobacco Use   Smoking status: Never   Smokeless tobacco: Never  Vaping Use   Vaping Use: Never used  Substance Use Topics   Alcohol use: Yes    Alcohol/week: 7.0 standard drinks    Types: 7 Glasses of wine per week   Drug use: Yes    Types: Marijuana    Comment: 2x/month    Review of  Systems Patient denies headaches, rhinorrhea, blurry vision, numbness, shortness of breath, chest pain, edema, cough, abdominal pain, nausea, vomiting, diarrhea, dysuria, fevers, rashes or hallucinations unless otherwise stated above in HPI. ____________________________________________   PHYSICAL EXAM:  VITAL SIGNS: Vitals:   01/02/21 1250 01/02/21 1305  BP: (!) 150/101 (!) 136/103  Pulse: 88 97  Resp: 18 16  Temp: 98 F (36.7 C) 97.9 F (36.6 C)  SpO2: 99% 96%    Constitutional: Alert and oriented.  Eyes: Conjunctivae are normal.  Head: Atraumatic. Nose: No congestion/rhinnorhea. Mouth/Throat: Mucous membranes are moist.   Neck: No stridor. Painless ROM.  Cardiovascular: Normal rate, regular rhythm. Grossly normal heart sounds.  Good peripheral circulation. Respiratory: Normal respiratory effort.  No retractions. Lungs CTAB. Gastrointestinal: Soft and nontender. No distention. No abdominal bruits. No CVA tenderness. Genitourinary:  Musculoskeletal: No lower extremity tenderness nor edema.  No joint effusions. Neurologic:   dense expressivve aphasia.  No drift,  MAE spontaneously, no facial droop Skin:  Skin is warm, dry and intact. No rash noted. Psychiatric: Mood and affect are normal. Speech and behavior are normal.  ____________________________________________   LABS (all labs ordered are listed, but only abnormal results are displayed)  Results for orders placed or performed during the hospital encounter of 01/02/21 (from the past 24 hour(s))  CBG monitoring, ED     Status: Abnormal   Collection Time: 01/02/21 11:33 AM  Result Value Ref Range   Glucose-Capillary 123 (H) 70 - 99 mg/dL   Comment 1 Notify RN    Comment 2 Document in Chart    ____________________________________________  EKG My review and personal interpretation at Time: 11:56   Indication: aphasia  Rate: 95  Rhythm: sinus Axis: normal Other: normal intervals, no  stemi ____________________________________________  RADIOLOGY  I personally reviewed all radiographic images ordered to evaluate for the above acute complaints and reviewed radiology reports and findings.  These findings were personally discussed with the patient.  Please see medical record for radiology report.  ____________________________________________   PROCEDURES  Procedure(s) performed:  .Critical Care  Date/Time: 01/02/2021 1:13 PM Performed by: Merlyn Lot, MD Authorized by: Merlyn Lot, MD   Critical care provider statement:    Critical care time (minutes):  35   Critical care time was exclusive of:  Separately billable procedures and treating other patients   Critical care was necessary to treat or prevent imminent or life-threatening deterioration of the following conditions:  CNS failure or compromise   Critical care was time spent personally by me on the following activities:  Development of treatment plan with patient or surrogate, discussions with consultants, evaluation of patient's response to treatment, examination of patient, obtaining history from patient or surrogate, ordering and performing treatments and interventions, ordering and review of laboratory studies, ordering and review of radiographic studies, pulse oximetry, re-evaluation of patient's condition and review of old charts    Critical Care performed: yes ____________________________________________   INITIAL IMPRESSION / [place] / ED COURSE  Pertinent labs & imaging results that were available during my care of the patient were reviewed by me and considered in my medical decision making (see chart for details).   DDX: cva, tia, hypoglycemia, dehydration, electrolyte abnormality, dissection, sepsis   Amber [name] is a 50 y.o. who presents to the ED with presentation as described above.  Made code stroke symptoms and she was within tPA window.  Deficits as described above.   Taken immediately to CT scanner.  No evidence of bleed.  CTA without evidence of LVO.  Patient was evaluated by Dr. Quinn Axe neurology upon arrival and felt to be a good candidate for tPA.  After discussing risk benefits patient consented to receiving tPA.    Clinical Course as of 01/02/21 1317  Fri Jan 02, 2021  1316 Pt remains stable.  Appropriate for admission to icu.   [PR]    Clinical Course User Index [PR] Merlyn Lot, MD    The patient was evaluated in Emergency Department today for the symptoms described in the history of present illness. He/she was evaluated in the context of the global COVID-19 pandemic, which necessitated consideration that the patient might be at risk for infection with the SARS-CoV-2 virus that causes COVID-19. Institutional  protocols and algorithms that pertain to the evaluation of patients at risk for COVID-19 are in a state of rapid change based on information released by regulatory bodies including the CDC and federal and state organizations. These policies and algorithms were followed during the patient's care in the ED.  As part of my medical decision making, I reviewed the following data within the [place] notes reviewed and incorporated, Labs reviewed, notes from prior ED visits and [place] Controlled Substance Database   ____________________________________________   FINAL CLINICAL IMPRESSION(S) / ED DIAGNOSES  Final diagnoses:  Slurred speech  Stroke [facility])      NEW MEDICATIONS STARTED DURING THIS VISIT:  New Prescriptions   No medications on file     Note:  This document was prepared using Dragon voice recognition software and may include unintentional dictation errors.    Merlyn Lot, MD 01/02/21 [phone]

## 2021-01-02 NOTE — Progress Notes (Signed)
OT Cancellation Note  Patient Details Name: Amber [name] MRN: [id number] DOB: December 23, 1970   Cancelled Treatment:    Reason Eval/Treat Not Completed: Medical issues which prohibited therapy. Pt with code stroke and received tPA. Per therapy guidelines, OT to hold for 24 hours and await new imaging. OT to follow up at that time.   Jackquline Denmark, MS, OTR/L , CBIS ascom [phone]  01/02/21, 2:32 PM

## 2021-01-02 NOTE — ED Triage Notes (Signed)
C/O slurred speech and right facial droop onset of symptoms 1110
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
## 2021-01-02 NOTE — Progress Notes (Signed)
   01/02/21 1251  Clinical Encounter Type  Visited With Patient and family together  Visit Type Initial;Spiritual support  Referral From Nurse  Consult/Referral To Chaplain  Spiritual Encounters  Spiritual Needs Prayer;Emotional  Chaplain Oleta Mouse responded to a Code Stroke in ED-5. Pt was moved to ED-4, [name]. Medical team working on Pt at the present time. Pt was then taking down to lab for more test. Pt's brother is present in hallway. I provided a ministry of presence, spiritual and emotional support. Pt's was brought back to the room and medical team working on Pt again. I informed Pt's brother I will follow up with [name] and if they need anymore assistance from the spiritual care department have the nurses give the on call chaplain a page.

## 2021-01-02 NOTE — Significant Event (Signed)
Neurology Significant Event  Per RN, patient developed new mild facial droop and transient worsening of expressive aphasia as she was finishing tPA. She also said her tongue felt heavy. Repeat head CT showed no hemorrhagic conversion. I examined her tongue and lips and she did not have significant angioedema that I could appreciate, although that can be a complication from tPA. She is in MRI now. CCM attending updated through epic chat.  Bing Neighbors, MD [facility] [phone]  If 7pm- 7am, please page neurology on call as listed in [facility].

## 2021-01-02 NOTE — Progress Notes (Signed)
CODE STROKE- PHARMACY COMMUNICATION   Time CODE STROKE called/page received:1135  Time response to CODE STROKE was made (in person) : 1145  Time Stroke Kit retrieved from [place] (only if needed):1140  Name of Provider/Nurse contacted:  Art gallery manager  Past Medical History:  Diagnosis Date   Anxiety    self reported   Asthma    Depression    controlled   Hyperlipidemia    controlled with medication   IBS (irritable bowel syndrome)    Stroke ([place]) 03-13-2015   Tension headache 11/18/2020    Prima Rayner Rodriguez-Guzman PharmD, BCPS 01/02/2021 12:32 PM

## 2021-01-02 NOTE — ED Notes (Signed)
Upon neuro check, pt complained of increasing facial numbness and a facial droop to the L side was noticed. ER provider notified, neuro paged, CT called at this time.

## 2021-01-03 ENCOUNTER — Inpatient Hospital Stay: Payer: BC Managed Care – PPO

## 2021-01-03 DIAGNOSIS — R4781 Slurred speech: Secondary | ICD-10-CM

## 2021-01-03 LAB — MAGNESIUM: Magnesium: 1.9 mg/dL (ref 1.7–2.4)

## 2021-01-03 LAB — BASIC METABOLIC PANEL
Anion gap: 4 — ABNORMAL LOW (ref 5–15)
BUN: 9 mg/dL (ref 6–20)
CO2: 27 mmol/L (ref 22–32)
Calcium: 9 mg/dL (ref 8.9–10.3)
Chloride: 112 mmol/L — ABNORMAL HIGH (ref 98–111)
Creatinine, Ser: 0.92 mg/dL (ref 0.44–1.00)
GFR, Estimated: >60 mL/min (ref >60)
Glucose, Bld: 109 mg/dL — ABNORMAL HIGH (ref 70–99)
Potassium: 3.5 mmol/L (ref 3.5–5.1)
Sodium: 143 mmol/L (ref 135–145)

## 2021-01-03 LAB — CBC
HCT: 43.7 % (ref 36.0–46.0)
Hemoglobin: 13.9 g/dL (ref 12.0–15.0)
MCH: 29 pg (ref 26.0–34.0)
MCHC: 31.8 g/dL (ref 30.0–36.0)
MCV: 91.2 fL (ref 80.0–100.0)
Platelets: 206 10*3/uL (ref 150–400)
RBC: 4.79 MIL/uL (ref 3.87–5.11)
RDW: 14.6 % (ref 11.5–15.5)
WBC: 6.2 10*3/uL (ref 4.0–10.5)
nRBC: 0 % (ref 0.0–0.2)

## 2021-01-03 LAB — HIV ANTIBODY (ROUTINE TESTING W REFLEX): HIV Screen 4th Generation wRfx: NONREACTIVE

## 2021-01-03 LAB — PHOSPHORUS: Phosphorus: 4.1 mg/dL (ref 2.5–4.6)

## 2021-01-03 MED ORDER — CLOPIDOGREL BISULFATE 75 MG PO TABS
75.0000 mg | ORAL_TABLET | Freq: Every day | ORAL | Status: DC
  Administered 2021-01-03 – 2021-01-07 (×4): 75 mg via ORAL
  Filled 2021-01-03 (×5): qty 1

## 2021-01-03 MED ORDER — CHLORHEXIDINE GLUCONATE CLOTH 2 % EX PADS
6.0000 | MEDICATED_PAD | Freq: Every day | CUTANEOUS | Status: DC
Start: 2021-01-03 — End: 2021-01-05
  Administered 2021-01-03 – 2021-01-04 (×2): 6 via TOPICAL

## 2021-01-03 MED ORDER — ASPIRIN-ACETAMINOPHEN-CAFFEINE 250-250-65 MG PO TABS
2.0000 | ORAL_TABLET | Freq: Four times a day (QID) | ORAL | Status: DC | PRN
  Administered 2021-01-03: 2 via ORAL
  Filled 2021-01-03 (×2): qty 2

## 2021-01-03 MED ORDER — ASPIRIN-ACETAMINOPHEN-CAFFEINE 250-250-65 MG PO TABS
1.0000 | ORAL_TABLET | Freq: Four times a day (QID) | ORAL | Status: DC | PRN
  Administered 2021-01-04: 08:00:00 1 via ORAL
  Filled 2021-01-03 (×3): qty 1

## 2021-01-03 MED ORDER — ASPIRIN EC 81 MG PO TBEC
81.0000 mg | DELAYED_RELEASE_TABLET | Freq: Every day | ORAL | Status: DC
  Administered 2021-01-03 – 2021-01-07 (×5): 81 mg via ORAL
  Filled 2021-01-03 (×5): qty 1

## 2021-01-03 NOTE — Evaluation (Signed)
Physical Therapy Evaluation Patient Details Name: Amber [name] MRN: [id number] DOB: 1970/09/26 Today's Date: 01/03/2021   History of Present Illness  Per MD notes, pt is a 50 year old woman who presented to the emergency department with acute onset expressive aphasia. Code stroke initiated 8/5 in the ED and transfered to ICU. PMH includes multiple remote cerebellar strokes, hyperlipidemia, anxiety, depression, asthma, tension headache. MD assessment includes acute CVA and is s/p tPA.  Clinical Impression  Pt was pleasant and motivated to participate during the session. She gave good effort and vitals were stable throughout session on room air. Pt demonstrated gross strength deficits primarily with LLE extremity and coordination deficits with BLE and BUE(R>L). Pt able to perform all functional mobility with supervision-min guard and min verbal cues for sequencing. Pt was overall steady walking with RW and even about 13ft without AD but was very slow and very short bilateral step length while demonstrating very narrow BOS progressing to near scissoring gait pattern with feet consistently making contact but no LOB. Pt's narrow BOS was not corrected despite verbal cueing. Pt reported a fear of falling and a sensation of weakness with her LLE that she could not explain. Pt is limited with functional mobility secondary to decreased strength. Pt reports having great support from family and friends. Pt will benefit from PT services in a CIR setting upon discharge to safely address deficits listed in patient problem list for decreased caregiver assistance and eventual return to PLOF.      Follow Up Recommendations CIR;Supervision for mobility/OOB    Equipment Recommendations  Other (comment) (TBD)    Recommendations for Other Services       Precautions / Restrictions Precautions Precautions: Fall Restrictions Weight Bearing Restrictions: No      Mobility  Bed Mobility Overal bed mobility: Needs  Assistance Bed Mobility: Supine to Sit     Supine to sit: Supervision;HOB elevated     General bed mobility comments: Verbal cues for sequencing.    Transfers Overall transfer level: Needs assistance Equipment used: None Transfers: Sit to/from Stand Sit to Stand: Min guard;From elevated surface         General transfer comment: Min guard for increased safety with no AD  Ambulation/Gait Ambulation/Gait assistance: Min guard Gait Distance (Feet): 80 Feet Assistive device: Rolling walker (2 wheeled);None Gait Pattern/deviations: Step-to pattern;Step-through pattern;Decreased stride length;Narrow base of support Gait velocity: decreased   General Gait Details: Pt walked with very slow cadence but able to increase speed during latter half of walk after verbal instruction. Pt ambulated 93ft without RW with decreased speed but no LOB. Pt demonstrated an overall very narrow BOS almost exhibitng a scissoring gait.   Stairs            Wheelchair Mobility    Modified Rankin (Stroke Patients Only)       Balance Overall balance assessment: Needs assistance Sitting-balance support: Feet unsupported;No upper extremity supported Sitting balance-Leahy Scale: Good Sitting balance - Comments: Supervision at EOB   Standing balance support: No upper extremity supported;Bilateral upper extremity supported Standing balance-Leahy Scale: Fair Standing balance comment: Good with static standing but fair with dynamic standing balance requiring BUE support through RW.                             Pertinent Vitals/Pain Pain Assessment: 0-10 Pain Score: 0-No pain Pain Location: headache Pain Descriptors / Indicators: Aching;Constant;Headache;Heaviness;Grimacing Pain Intervention(s): Monitored during session;Relaxation (NSG aware giving Pain Meds)  Home Living Family/patient expects to be discharged to:: Private residence Living Arrangements: Children (63 y.o.  daughter) Available Help at Discharge: Family;Available 24 hours/day Type of Home: House Home Access: Level entry     Home Layout: Two level;Able to live on main level with bedroom/bathroom Home Equipment: Grab bars - tub/shower Additional Comments: Pt reports that between children and local family she will have 24/7 supervision.    Prior Function Level of Independence: Independent         Comments: Independent with ambulation and ADLs. Requires more time and rest breaks for longer community distances. 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     Hand Dominance   Dominant Hand: Right    Extremity/Trunk Assessment   Upper Extremity Assessment Upper Extremity Assessment: Overall WFL for tasks assessed;RUE deficits/detail;LUE deficits/detail (Strength grossly 5/5 bilaterally; sensation intact) RUE Deficits / Details: Demonstrated decreased speed compared to LUE during coordination tasks overall including finger to nose, finger to thumb sequential opposition, and rapid alternating movements(sup/pron). RUE Sensation: WNL RUE Coordination: decreased fine motor;decreased gross motor LUE Deficits / Details: Demonstrated increased speed and accuracy during coordination tasks compared to RUE but still not WNL overall including finger to nose, finger to thumb sequential opposition, and rapid alternating movements(sup/pron). LUE Coordination: decreased fine motor;decreased gross motor    Lower Extremity Assessment Lower Extremity Assessment: RLE deficits/detail;LLE deficits/detail RLE Deficits / Details: Strength grossly 5/5; demonstrated decreased speed and difficulty coordination test sliding right heel up/down anterior portion of left shin staying primarily on the medial edge RLE Sensation: WNL RLE Coordination: decreased gross motor LLE Deficits / Details: Strength grossly 4/5 demonstrating increased tremors when performing MMT; demonstrated even more decreased speed and difficulty compared to  RLE coordination test sliding right heel up/down anterior portion which may also be due to decreased strength. LLE Sensation: WNL LLE Coordination: decreased gross motor       Communication   Communication: Expressive difficulties  Cognition Arousal/Alertness: Awake/alert Behavior During Therapy: WFL for tasks assessed/performed Overall Cognitive Status: Within Functional Limits for tasks assessed                                        General Comments      Exercises Other Exercises Other Exercises: Static sitting at EOB 4-32min with supervision for improved trunk control and balance. Other Exercises: Static standin 1-17min x2 with min guard for improved activity tolerance.   Assessment/Plan    PT Assessment Patient needs continued PT services  PT Problem List Decreased strength;Decreased activity tolerance;Decreased balance;Decreased mobility;Decreased knowledge of use of DME;Decreased safety awareness       PT Treatment Interventions DME instruction;Gait training;Stair training;Functional mobility training;Therapeutic activities;Therapeutic exercise;Balance training;Patient/family education    PT Goals (Current goals can be found in the Care Plan section)  Acute Rehab PT Goals Patient Stated Goal: Walk in a straight line and do more for herself. PT Goal Formulation: With patient Time For Goal Achievement: 01/16/21 Potential to Achieve Goals: Good    Frequency 7X/week   Barriers to discharge        Co-evaluation               AM-PAC PT "6 Clicks" Mobility  Outcome Measure Help needed turning from your back to your side while in a flat bed without using bedrails?: None Help needed moving from lying on your back to sitting on the side of a flat bed without  using bedrails?: A Little Help needed moving to and from a bed to a chair (including a wheelchair)?: A Little Help needed standing up from a chair using your arms (e.g., wheelchair or bedside  chair)?: A Little Help needed to walk in hospital room?: A Little Help needed climbing 3-5 steps with a railing? : A Lot 6 Click Score: 18    End of Session Equipment Utilized During Treatment: Gait belt Activity Tolerance: Patient tolerated treatment well Patient left: in chair;with call bell/phone within reach;with family/visitor present Nurse Communication: Mobility status PT Visit Diagnosis: Unsteadiness on feet (R26.81);Muscle weakness (generalized) (M62.81);Difficulty in walking, not elsewhere classified (R26.2);Hemiplegia and hemiparesis Hemiplegia - Right/Left: Left (LLE>LUE) Hemiplegia - dominant/non-dominant: Non-dominant    Time: 4098-1191 PT Time Calculation (min) (ACUTE ONLY): 44 min   Charges:              Desiree Hane SPT 01/03/21, 5:05 PM
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
## 2021-01-03 NOTE — Progress Notes (Signed)
PHARMACY CONSULT NOTE - FOLLOW UP  Pharmacy Consult for Electrolyte Monitoring and Replacement   Recent Labs: Potassium (mmol/L)  Date Value  01/03/2021 3.5   Magnesium (mg/dL)  Date Value  08/19/2246 1.9   Calcium (mg/dL)  Date Value  25/00/3704 9.0   Albumin (g/dL)  Date Value  88/89/1694 3.7  05/12/2016 4.3   Phosphorus (mg/dL)  Date Value  50/38/8828 4.1   Sodium (mmol/L)  Date Value  01/03/2021 143  12/17/2019 143    Assessment: Patient admitted following Code Stroke, tPA given 8/5. Pharmacy consult was placed to replace electrolytes per protocol.  Goal of Therapy:  Within normal limits  Plan:  -No replacement indicated today -Continue to follow along  Laureen Ochs, PharmD, BCPS 01/03/2021 10:47 AM

## 2021-01-03 NOTE — Progress Notes (Signed)
Neurology Progress Note  Patient ID: 50 year old woman with a past medical history significant for multiple remote cerebellar strokes, hyperlipidemia, anxiety, depression, asthma, tension headache presented to ED 01/03/21 after acute onset of expressive aphasia and received tPA 2/2 c/f acute ischemic stroke.  Subjective: - Patient's expressive aphasia has improved but not resolved - She is highly motivated and is tentatively planned for discharge to acute rehab - Headache this morning is typical of her tension headaches and resolved with excedrin - No new neurologic complaints today  Interval data:  MRI brain: acute infarcts L middle frontal gyrus and L parietal gyrus, suspect embolic source (personal review)  TTE pending  A1c 6.2 LDL 100  Partial set hypercoag labs pending from yesterday (ANA, cardiolipin AB, homocysteine)  Objective  Vitals:   01/03/21 1700 01/03/21 1800  BP: (!) 151/107 (!) 135/97  Pulse: (!) 106 89  Resp: (!) 24 14  Temp:    SpO2: 98% 96%   Physical Exam Gen: alert, able to follow commands, oriented x4 HEENT: Atraumatic, normocephalic;mucous membranes moist; oropharynx clear, tongue without atrophy or fasciculations. Neck: Supple, trachea midline. Resp: CTAB, no w/r/r CV: RRR, no m/g/r; nml S1 and S2. 2+ symmetric peripheral pulses. Abd: soft/NT/ND; nabs x 4 quad Extrem: Nml bulk; no cyanosis, clubbing, or edema.   Neuro: *MS: alert, able to follow commands, oriented x4 *Speech: moderate expressive aphasia, significantly improved from yesterday, minimal dysarthria *CN:   I: Deferred   II,III: PERRLA, VFF by confrontation, optic discs not visualized 2/2 pupillary constriction   III,IV,VI: EOMI w/o nystagmus, no ptosis   V: Sensation intact from V1 to V3 to LT   VII: Eyelid closure was full.  Smile symmetric.   VIII: Hearing intact to voice   IX,X: Voice normal, palate elevates symmetrically   XI: SCM/trap 5/5 bilat   XII: Tongue protrudes midline,  no atrophy or fasciculations   *Motor:   Normal bulk.  No tremor, rigidity or bradykinesia. No pronator drift.     Strength: Dlt Bic Tri WrE WrF FgS Gr HF KnF KnE PlF DoF   Left 5 5 5 5 5 5 5 5 5 5 5 5    Right 5 5 5 5 5 5 5 5 5 5 5 5      *Sensory: Intact to light touch, pinprick, temperature vibration throughout. Symmetric. Propioception intact bilat.  No double-simultaneous extinction. *Coordination:  UTA 2/2 confusion *Reflexes:  2+ and symmetric throughout without clonus; toes down-going bilat *Gait: deferred   NIH stroke scale is 2 today (from 4 yesterday)     Premorbid mRS = 11  A/P: 50 year old woman with pmhx multiple ischemic strokes (not including the 2 new infarcts from yesterday she has already had 6 prior ischemic strokes (3 L cerebellar, biparietal, and L frontal)), HL, anxiety, depression who was admitted yesterday with acute onset expressive aphasia and received tPA. MRI brain confirmed 2 new areas of acute infarct (L frontal and L parietal). Her stroke patterns appear embolic and she is clearly too young to be having so many strokes. She previously followed with Dr. 1 at [facility] and when she was last seen there in 2018 she had a ILR in place. I will clarify with her tomorrow if this was removed, I do not see any device checks after 2020. TTE is still pending. If no e/o intracardiac clot, would recommend TEE. She should also have the full hypercoag workup performed when able to do so (outside tPA duration and not on heparin).  - TTE pending,  if neg recommend TEE - Confirm what happened to ILR - For now, recommend ASA 81mg  daily + plavix 75mg  daily x21 days f/b plavix 75mg  daily monotherapy after that -Agree with aggressive SLP/OT/PT - acute rehab would be a great option for her - Will continue to follow  , MD [facility] [phone]  If 7pm- 7am, please page neurology on call as listed in AMION.
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
## 2021-01-03 NOTE — Evaluation (Signed)
Occupational Therapy Evaluation Patient Details Name: Amber [name] MRN: [id number] DOB: 03-29-1971 Today's Date: 01/03/2021    History of Present Illness Per MD notes, pt is a 50 year old woman who presented to the emergency department with acute onset expressive aphasia. Code stroke initiated 8/5 in the ED and transfered to ICU. PMH includes multiple remote cerebellar strokes, hyperlipidemia, anxiety, depression, asthma, tension headache. MD assessment includes acute CVA and is s/p tPA.   Clinical Impression   Pt seen for OT evaluation this date in setting of acute hospitalization d/t CVA. Pt presents with some decreased L side strength and coordination. At baseline, pt is INDEP including caring for her 48 y/o dtr. On ADL assessment, pt requires MIN A for seated UB ADLs d/t decreased L hand dexterity, MIN/MOD A for seated LB ADLs d/t combined L UE and L LE weakness, and CGA for ADL transfers with no AD. Pt with F standing balance, but notably is unsure of herself in standing. Pt requiring increased processing time for all aspects of communication and [facility] tasks as well as motor planning to take steps including more intricate tasks with fxl mobility such as pivoting/turning. Pt will require extensive rehabilitation efforts to restore function to her INDEP baseline. Anticipate she will require CIR upon d/c from acute setting. Will continue to follow.     Follow Up Recommendations  CIR    Equipment Recommendations  Other (comment) (defer to next level of care)    Recommendations for Other Services       Precautions / Restrictions Precautions Precautions: Fall Restrictions Weight Bearing Restrictions: No      Mobility Bed Mobility Overal bed mobility: Needs Assistance Bed Mobility: Supine to Sit     Supine to sit: Supervision;HOB elevated     General bed mobility comments: up to chair pre/post session    Transfers Overall transfer level: Needs assistance Equipment used:  None Transfers: Sit to/from Stand Sit to Stand: Min guard         General transfer comment: Min guard for increased safety with no AD    Balance Overall balance assessment: Needs assistance Sitting-balance support: Feet unsupported;No upper extremity supported Sitting balance-Leahy Scale: Good Sitting balance - Comments: Supervision at EOB   Standing balance support: No upper extremity supported;Bilateral upper extremity supported Standing balance-Leahy Scale: Fair Standing balance comment: G static standing, F with fxl mobility, unsure of herself                           ADL either performed or assessed with clinical judgement   ADL                                         General ADL Comments: requires MIN A for seated UB ADLs, MIN/MOD A for seated LB ADLs. CGA for ADL transfers.     Vision   Additional Comments: pt appears able to track in all qudrants, needs to be further assessed in a fucntional context.     Perception     Praxis      Pertinent Vitals/Pain Pain Assessment: 0-10 Pain Score: 2  Pain Location: headache Pain Descriptors / Indicators: Headache;Heaviness Pain Intervention(s): Monitored during session     Hand Dominance Right   Extremity/Trunk Assessment Upper Extremity Assessment Upper Extremity Assessment: RUE deficits/detail;LUE deficits/detail RUE Deficits / Details: ROM WFL, MMT grossly 4/5, demonstrated increased speed  versus L hand on rapid alternating movements test. RUE Sensation: WNL  LUE Deficits / Details: ROM WFL, MMT grossly 3+/5, decreased speed on RAM test +dysdiadochokinesia. LUE Sensation: decreased proprioception ("heavy") LUE Coordination: decreased fine motor;decreased gross motor   Lower Extremity Assessment Lower Extremity Assessment: Defer to PT evaluation (L LE with slight, but notable weakness versus R LE, impacting LB ADLs including sitting to don socks.) RLE Deficits / Details: Grossly  5/5; demonstrated decreased speed and difficulty coordination test sliding right heel up/down anterior portion of left shin staying primarily on the medial edge RLE Sensation: WNL  LLE Deficits / Details: Grossly 4/5 demonstrated increased tremors when performing MMT; demonstrated even more decreased speed and difficulty compared to RLE coordination test sliding right heel up/down anterior portion which may also be due to decreased strength. LLE Sensation:  ("heavy") LLE Coordination: decreased gross motor       Communication Communication Communication: Expressive difficulties   Cognition Arousal/Alertness: Awake/alert Behavior During Therapy: WFL for tasks assessed/performed Overall Cognitive Status: Within Functional Limits for tasks assessed                                 General Comments: expressive difficulties, but pt is appropraite throughout session   General Comments       Exercises Other Exercises Other Exercises: OT ed re: role of OT, FMC exercises x3 (open/close, abd/add, thumb opposition/reposition), IR benefits.    Shoulder Instructions      Home Living Family/patient expects to be discharged to:: Private residence Living Arrangements: Children (51 y/o dtr) Available Help at Discharge: Family;Available 24 hours/day Type of Home: House Home Access: Level entry     Home Layout: Two level;Able to live on main level with bedroom/bathroom Alternate Level Stairs-Number of Steps: 10 Alternate Level Stairs-Rails: Right Bathroom Shower/Tub: Chief Strategy Officer: Standard Bathroom Accessibility: Yes   Home Equipment: Grab bars - tub/shower   Additional Comments: Pt reports that between children and local family she will have 24/7 supervision.  Lives With: Daughter    Prior Functioning/Environment Level of Independence: Independent        Comments: Independent with ambulation and ADLs. Requires more time and rest breaks for longer  community distances. 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        OT Problem List: Decreased strength;Decreased activity tolerance;Impaired balance (sitting and/or standing);Decreased coordination;Impaired UE functional use      OT Treatment/Interventions: Self-care/ADL training;Therapeutic activities;Balance training;Therapeutic exercise;Neuromuscular education;Patient/family education    OT Goals(Current goals can be found in the care plan section) Acute Rehab OT Goals Patient Stated Goal: be able to do more for herself and make it to 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 OT Goal Formulation: With patient/family Time For Goal Achievement: 01/17/21 Potential to Achieve Goals: Good ADL Goals Pt Will Perform Upper Body Dressing: with supervision;with set-up;sitting (with hemi dressing technique PRN) Pt Will Perform Lower Body Dressing: with min guard assist;with supervision;sit to/from stand (with modified technique as needed) Pt Will Transfer to Toilet: ambulating (with LRAD PRN) Pt Will Perform Toileting - Clothing Manipulation and hygiene: with supervision;sit to/from stand Additional ADL Goal #1: Pt will complete first 4 FMC exercises (handout) with <10% verbal cues for form Additional ADL Goal #2: pt will complete [facility] task with 80% success ads evidenced by manipulating small functional items with <10% droppage on 4/5 trials.  OT  Frequency: Min 3X/week   Barriers to D/C:            Co-evaluation              AM-PAC OT "6 Clicks" Daily Activity     Outcome Measure Help from another person eating meals?: None Help from another person taking care of personal grooming?: A Little Help from another person toileting, which includes using toliet, bedpan, or urinal?: A Little Help from another person bathing (including washing, rinsing, drying)?: A Little Help from another person to put on and taking off regular upper body clothing?: A  Little Help from another person to put on and taking off regular lower body clothing?: A Little 6 Click Score: 19   End of Session Equipment Utilized During Treatment: Gait belt Nurse Communication: Mobility status  Activity Tolerance: Patient tolerated treatment well Patient left: in chair;with call bell/phone within reach;with nursing/sitter in room;with family/visitor present  OT Visit Diagnosis: Unsteadiness on feet (R26.81);Other symptoms and signs involving the nervous system (R29.898);Hemiplegia and hemiparesis Hemiplegia - Right/Left: Left Hemiplegia - dominant/non-dominant: Non-Dominant Hemiplegia - caused by: Cerebral infarction                Time: 2081-3887 OT Time Calculation (min): 43 min Charges:  OT General Charges $OT Visit: 1 Visit OT Evaluation $OT Eval Moderate Complexity: 1 Mod OT Treatments $Self Care/Home Management : 8-22 mins $Therapeutic Activity: 8-22 mins  Rejeana Brock, MS, OTR/L ascom [phone] 01/03/21, 6:21 PM

## 2021-01-03 NOTE — Progress Notes (Signed)
Report called to [facility], Charity fundraiser. Patient transported to room 101 via wheelchair with cardiac monitor. Patient denies pain. Mother is at bedside.

## 2021-01-03 NOTE — Progress Notes (Signed)
Neurological Exam Level of arousal and orientation to time, place, and person were intact. Mild-moderate aphasia remains Attention span and concentration were normal  CN: II - Visual field intact OU III, IV, VI - Extraocular movements intact. V - Facial sensation intact bilaterally. VII - Facial movement intact bilaterally VIII - Hearing & vestibular intact bilaterally X - Palate elevates symmetrically XI - Chin turning & shoulder shrug intact bilaterally. XII - Tongue protrusion intact Motor Strength - strength 5/5 LUE, LLE, RLE extremities & 4/5 in RUE, pronator drift was absent in all extremities Sensory - Light touch was assessed and was symmetrical Coordination - The patient had normal movements in the hands and feet with no ataxia or dysmetria.  Tremor was absent Gait and Station - deferred.   NIHSS Level of  Consciousness: 0 = Alert, keenly responsive; 1 = Not alert, but arousable by minor stimulation to obey, answer, or respond; 2 = Not alert, requires repeated stimulation to attend, or is obtunded and requires strong or painful stimulation to make movements (not stereotyped); 3 = Responds only with reflex motor or autonomic effects or totally unresponsive, flaccid, and areflexic   0  LOC Questions 0 = Answers both questions correctly; 1 = Answers one question correctly; 2 = Answers neither question correctly.  0  LOC Commands 0 = Performs both tasks correctly; 1 = Performs one task correctly; 2 = Performs neither task correctly  0  Best Gaze  0 = Normal; 1 = Partial gaze palsy; gaze is abnormal in one or both eyes, but forced deviation or total gaze paresis is not present; 2 = Forced deviation, or total gaze paresis not overcome by the oculocephalic maneuver   0  Visual 0 = No visual loss; 1 = Partial hemianopia; 2 = Complete hemianopia; 3 = Bilateral hemianopia (blind including cortical blindness).   0  Facial Palsy 0 = Normal symmetrical movements; 1 = Minor paralysis (flattened  nasolabial fold, asymmetry on smiling); 2 = Partial paralysis (total or near-total paralysis of lower face); 3 = Complete paralysis of one or both sides (absence of facial movement in the upper and lower face).  0  Motor Arm LEFT 0 = No drift; limb holds 90 (or 45) degrees for full 10 secs; 1 = Drift; limb holds 90 (or 45) degrees, but drifts down before full 10 seconds; does not hit bed or other support; 2 = Some effort against gravity; limb cannot get to or maintain (if cued) 90 (or 45) degrees, drifts down to bed, but has some effort against gravity; 3 = No effort against gravity; limb falls; 4 = No movement; UN = unable to test (Amputation or joint fusion).   0  Motor Arm RIGHT 0 = No drift; limb holds 90 (or 45) degrees for full 10 secs; 1 = Drift; limb holds 90 (or 45) degrees, but drifts down before full 10 seconds; does not hit bed or other support; 2 = Some effort against gravity; limb cannot get to or maintain (if cued) 90 (or 45) degrees, drifts down to bed, but has some effort against gravity; 3 = No effort against gravity; limb falls; 4 = No movement; UN = unable to test (Amputation or joint fusion).   0  Motor Leg LEFT 0 = No drift; leg holds 30 degree position for full 5 secs; 1 = Drift; leg falls by the end of the 5-sec period but does not hit bed; 2 = Some effort against gravity; leg falls to bed by  5 secs, but has some effort against gravity; 3 = No effort against gravity; leg falls to bed immediately; 4 = No movement; UN = unable to test (Amputation or joint fusion).   0  Motor Leg RIGHT 0 = No drift; leg holds 30 degree position for full 5 secs; 1 = Drift; leg falls by the end of the 5-sec period but does not hit bed; 2 = Some effort against gravity; leg falls to bed by 5 secs, but has some effort against gravity; 3 = No effort against gravity; leg falls to bed immediately; 4 = No movement; UN = unable to test (Amputation or joint fusion).   0  Limb Ataxia 0 = Absent; 1 = Present in one  limb; 2 = Present in two limbs; UN = Amputation or joint fusion   0  Sensory  0 = Normal, no sensory loss; 1 = Mild-to-moderate sensory loss, patient feels pinprick is less sharp or is dull on the affected side, or there is a loss of superficial pain with pinprick, but patient is aware of being touched; 2 = Severe to total sensory loss, patient is not aware of being touched in the face, arm, and leg.   0  Best Language 0 = No aphasia; normal; 1 = Mild-to-moderate aphasia; some obvious loss of fluency or facility of comprehension, w/o significant limitation on ideas expressed or form of expression. Reduction of speech and/or comprehension, however, makes conversation about provided materials difficult or impossible. For example, in conversation about provided materials, examiner can identify picture or naming card content from patient's response; 2 = Severe aphasia; all communication is through fragmentary expression; great need for inference, questioning, and guessing by the listener. Range of information that can be exchanged is limited; listener carries burden of communication. Examiner cannot identify materials provided from patient response; 3 = Mute, global aphasia; no usable speech or auditory comprehension  1  Dysarthria 0 = Normal; 1 = Mild-to-moderate dysarthria, patient slurs at least some words and, at worst, can be understood with some difficulty; 2 = Severe dysarthria, patient's speech is so slurred as to be unintelligible in the absence of or out of proportion to any dysphasia, or is mute/anarthric; UN = Intubated or other physical barrier  1  Extinction/Inattention 0 = No abnormality 1 = Visual, tactile, auditory, spatial, or personal extinction/inattention to bilateral simultaneous stimulation in one of the sensory modalities. 2 = Profound hemi-inattention or extinction to more than one modality; does not recognize own hand or orients to only one side of space.  0  Total   2   Patient is >  24 hours post TPA. Patient worked with PT/OT & SLP today. Will downgrade to medical surgical level of care and sign off completed to Dr. Margo Aye with [facility] service, who will pick up on 01/04/21.  Patient and mother, who is bedside, alerted to changes in care- all questions answered at this time.  Cheryll Cockayne Rust-Chester, AGACNP-BC Acute Care Nurse Practitioner [facility]   [phone] / [phone] Please see Amion for pager details.

## 2021-01-04 ENCOUNTER — Inpatient Hospital Stay (HOSPITAL_COMMUNITY)
Admit: 2021-01-04 | Discharge: 2021-01-04 | Disposition: A | Discharge status: Home / Self Care | Payer: BC Managed Care – PPO | Attending: Critical Care Medicine | Admitting: Critical Care Medicine

## 2021-01-04 ENCOUNTER — Encounter: Payer: Self-pay | Admitting: Pulmonary Disease

## 2021-01-04 ENCOUNTER — Other Ambulatory Visit: Payer: Self-pay

## 2021-01-04 DIAGNOSIS — I639 Cerebral infarction, unspecified: Principal | ICD-10-CM

## 2021-01-04 DIAGNOSIS — I6389 Other cerebral infarction: Secondary | ICD-10-CM | POA: Diagnosis not present

## 2021-01-04 DIAGNOSIS — R4701 Aphasia: Secondary | ICD-10-CM

## 2021-01-04 LAB — CBC
HCT: 42.6 % (ref 36.0–46.0)
Hemoglobin: 14 g/dL (ref 12.0–15.0)
MCH: 29.4 pg (ref 26.0–34.0)
MCHC: 32.9 g/dL (ref 30.0–36.0)
MCV: 89.5 fL (ref 80.0–100.0)
Platelets: 231 10*3/uL (ref 150–400)
RBC: 4.76 MIL/uL (ref 3.87–5.11)
RDW: 14.5 % (ref 11.5–15.5)
WBC: 5.9 10*3/uL (ref 4.0–10.5)
nRBC: 0 % (ref 0.0–0.2)

## 2021-01-04 LAB — BASIC METABOLIC PANEL
Anion gap: 6 (ref 5–15)
BUN: 12 mg/dL (ref 6–20)
CO2: 28 mmol/L (ref 22–32)
Calcium: 8.9 mg/dL (ref 8.9–10.3)
Chloride: 108 mmol/L (ref 98–111)
Creatinine, Ser: 0.9 mg/dL (ref 0.44–1.00)
GFR, Estimated: >60 mL/min (ref >60)
Glucose, Bld: 97 mg/dL (ref 70–99)
Potassium: 3.5 mmol/L (ref 3.5–5.1)
Sodium: 142 mmol/L (ref 135–145)

## 2021-01-04 LAB — ECHOCARDIOGRAM COMPLETE BUBBLE STUDY
AR max vel: 2.7 cm2
AV Area VTI: 3.04 cm2
AV Area mean vel: 2.51 cm2
AV Mean grad: 2 mmHg
AV Peak grad: 3.3 mmHg
Ao pk vel: 0.91 m/s
Area-P 1/2: 3.2 cm2
MV VTI: 2.88 cm2
S' Lateral: 3.5 cm

## 2021-01-04 LAB — PHOSPHORUS: Phosphorus: 4.1 mg/dL (ref 2.5–4.6)

## 2021-01-04 LAB — MAGNESIUM: Magnesium: 2.1 mg/dL (ref 1.7–2.4)

## 2021-01-04 MED ORDER — ENOXAPARIN SODIUM 40 MG/0.4ML IJ SOSY
40.0000 mg | PREFILLED_SYRINGE | INTRAMUSCULAR | Status: DC
  Administered 2021-01-04 – 2021-01-05 (×2): 40 mg via SUBCUTANEOUS
  Filled 2021-01-04 (×3): qty 0.4

## 2021-01-04 NOTE — H&P (View-Only) (Signed)
    [facility] has been requested to perform a transesophageal echocardiogram on Amber [name] for cryptogenic stroke.  After careful review of history and examination, the risks and benefits of transesophageal echocardiogram have been explained including risks of esophageal damage, perforation (1:10,000 risk), bleeding, pharyngeal hematoma as well as other potential complications associated with conscious sedation including aspiration, arrhythmia, respiratory failure and death. Alternatives to treatment were discussed, questions were answered. Patient is willing to proceed.   2D surface echo 01/04/2021: 1. Left ventricular ejection fraction, by estimation, is 55 to 60%. The  left ventricle has normal function. The left ventricle has no regional  wall motion abnormalities. Left ventricular diastolic parameters are  consistent with Grade I diastolic  dysfunction (impaired relaxation).   2. Right ventricular systolic function is normal. The right ventricular  size is normal.   3. The mitral valve is degenerative. Mild mitral valve regurgitation.   4. The aortic valve is tricuspid. Aortic valve regurgitation is not  visualized.   5. Agitated saline contrast bubble study was negative, with no evidence  of any interatrial shunt.   Telemetry: SR with sinus tachycardia, rare PVCs. No evidence of Afib/flutter.     Mckinzi Eriksen, PA-C 01/04/2021 12:20 PM   

## 2021-01-04 NOTE — Progress Notes (Signed)
*  PRELIMINARY RESULTS* Echocardiogram 2D Echocardiogram has been performed.  Joanette Gula Liam Bossman 01/04/2021, 9:37 AM

## 2021-01-04 NOTE — Progress Notes (Signed)
    [facility] has been requested to perform a transesophageal echocardiogram on [name] for cryptogenic stroke.  After careful review of history and examination, the risks and benefits of transesophageal echocardiogram have been explained including risks of esophageal damage, perforation (1:10,000 risk), bleeding, pharyngeal hematoma as well as other potential complications associated with conscious sedation including aspiration, arrhythmia, respiratory failure and death. Alternatives to treatment were discussed, questions were answered. Patient is willing to proceed.   2D surface echo 01/04/2021: 1. Left ventricular ejection fraction, by estimation, is 55 to 60%. The  left ventricle has normal function. The left ventricle has no regional  wall motion abnormalities. Left ventricular diastolic parameters are  consistent with Grade I diastolic  dysfunction (impaired relaxation).   2. Right ventricular systolic function is normal. The right ventricular  size is normal.   3. The mitral valve is degenerative. Mild mitral valve regurgitation.   4. The aortic valve is tricuspid. Aortic valve regurgitation is not  visualized.   5. Agitated saline contrast bubble study was negative, with no evidence  of any interatrial shunt.   Telemetry: SR with sinus tachycardia, rare PVCs. No evidence of Afib/flutter.     Eula Listen, PA-C 01/04/2021 12:20 PM

## 2021-01-04 NOTE — Progress Notes (Signed)
PROGRESS NOTE        PATIENT DETAILS Name: Amber [name] Age: 50 y.o. Sex: female Date of Birth: December 05, 1970 Admit Date: 01/02/2021 Admitting Physician Vida Rigger, MD VZD:GLOVF, Keane Scrape, NP  Brief Narrative: Patient is a 50 y.o. female with prior history of cryptogenic CVA (underwent extensive work-up in 2016) who presented with acute expressive aphasia-received tPA-monitored in the ICU-subsequently transferred to the [facility] service on 8/7.  See below for further details.    Significant events: 8/5>> admit-expressive aphasia-acute CVA-received tPA-sent to ICU 8/7>> transfer to [facility]  Significant studies: 8/5>> MRI brain: Acute infarct in the left middle frontal, left parietal areas. 8/5>> CT angio head/neck: No evidence of LVO or hemodynamically significant stenosis. 8/5>> A1c 6.2 8/5>> LDL: 86  Antimicrobial therapy: None  Microbiology data: None  Procedures : None  Consults: Neurology, PCCM  DVT Prophylaxis : SCDs Start: 01/02/21 1310   Subjective: Lying comfortably in bed-still with some speech abnormalities but claims that she is slowly improving.   Assessment/Plan: Acute CVA: Still with some expressive aphasia-has prior history of CVA-deemed cryptogenic in nature in 2016.  Discussed with neurology-patient needs a TEE which will be scheduled tomorrow per cardiology.  Recommendations are to continue aspirin/Plavix x3 weeks followed by Plavix alone.  History of bronchial asthma: Stable without any exacerbation  Morbid Obesity: Estimated body mass index is 32.3 kg/m as calculated from the following:   Height as of 12/18/20: 5\' 2"  (1.575 m).   Weight as of this encounter: 80.1 kg.     Diet: Diet Order             Diet regular Room service appropriate? Yes; Fluid consistency: Thin  Diet effective now                    Code Status: Full code   Family Communication: Mother at bedside   Disposition Plan: Status is:  Inpatient  Remains inpatient appropriate because:Inpatient level of care appropriate due to severity of illness  Dispo: The patient is from: Home              Anticipated d/c is to: Home              Patient currently is not medically stable to d/c.   Difficult to place patient No    Barriers to Discharge: Needs TEE prior to discharge.  Antimicrobial agents: Anti-infectives (From admission, onward)    None        Time spent: 25-minutes-Greater than 50% of this time was spent in counseling, explanation of diagnosis, planning of further management, and coordination of care.  MEDICATIONS: Scheduled Meds:   stroke: mapping our early stages of recovery book   Does not apply Once   vitamin C  1,000 mg Oral Daily   aspirin EC  81 mg Oral Daily   Chlorhexidine Gluconate Cloth  6 each Topical Daily   cholecalciferol  1,000 Units Oral Daily   clopidogrel  75 mg Oral Daily   multivitamin with minerals  1 tablet Oral Daily   pantoprazole (PROTONIX) IV  40 mg Intravenous QHS   rosuvastatin  40 mg Oral Daily   sodium chloride flush  3 mL Intravenous Once   Continuous Infusions:  clevidipine Stopped (01/02/21 1946)   PRN Meds:.acetaminophen, albuterol, aspirin-acetaminophen-caffeine, docusate sodium, ondansetron (ZOFRAN) IV, oxyCODONE, polyethylene glycol   PHYSICAL EXAM: Vital  signs: Vitals:   01/03/21 2334 01/04/21 0506 01/04/21 0721 01/04/21 1141  BP: 122/84 (!) 133/94 125/90 (!) 118/94  Pulse: 93 74 88 91  Resp: 16 16 20 16   Temp: 97.6 F (36.4 C) 98.1 F (36.7 C) 97.8 F (36.6 C) 97.9 F (36.6 C)  TempSrc:      SpO2: 99% 98% 99% 98%  Weight:       Filed Weights   01/02/21 1200 01/03/21 0409  Weight: 81.2 kg 80.1 kg   Body mass index is 32.3 kg/m.   Gen Exam:Alert awake-not in any distress HEENT:atraumatic, normocephalic Chest: B/L clear to auscultation anteriorly CVS:S1S2 regular Abdomen:soft non tender, non distended Extremities:no edema Neurology: Mild  left upper extremity weakness. Skin: no rash  I have personally reviewed following labs and imaging studies  LABORATORY DATA: CBC: Recent Labs  Lab 01/02/21 1343 01/03/21 0446 01/04/21 0419  WBC 9.5 6.2 5.9  NEUTROABS 6.5  --   --   HGB 14.2 13.9 14.0  HCT 43.4 43.7 42.6  MCV 87.9 91.2 89.5  PLT 210 206 231    Basic Metabolic Panel: Recent Labs  Lab 01/02/21 1343 01/03/21 0446 01/04/21 0419  NA 141 143 142  K 4.0 3.5 3.5  CL 106 112* 108  CO2 26 27 28   GLUCOSE 98 109* 97  BUN 10 9 12   CREATININE 0.89 0.92 0.90  CALCIUM 9.9 9.0 8.9  MG  --  1.9 2.1  PHOS  --  4.1 4.1    GFR: Estimated Creatinine Clearance: 74.9 mL/min (by C-G formula based on SCr of 0.9 mg/dL).  Liver Function Tests: Recent Labs  Lab 01/02/21 1343  AST 21  ALT 28  ALKPHOS 74  BILITOT 0.7  PROT 7.5  ALBUMIN 3.7   No results for input(s): LIPASE, AMYLASE in the last 168 hours. No results for input(s): AMMONIA in the last 168 hours.  Coagulation Profile: Recent Labs  Lab 01/02/21 1343  INR 1.1    Cardiac Enzymes: No results for input(s): CKTOTAL, CKMB, CKMBINDEX, TROPONINI in the last 168 hours.  BNP (last 3 results) No results for input(s): PROBNP in the last 8760 hours.  Lipid Profile: Recent Labs    01/02/21 1343  CHOL 181  HDL 76  LDLCALC 86  TRIG 94  CHOLHDL 2.4    Thyroid Function Tests: No results for input(s): TSH, T4TOTAL, FREET4, T3FREE, THYROIDAB in the last 72 hours.  Anemia Panel: No results for input(s): VITAMINB12, FOLATE, FERRITIN, TIBC, IRON, RETICCTPCT in the last 72 hours.  Urine analysis:    Component Value Date/Time   COLORURINE YELLOW 02/13/2017 1405   APPEARANCEUR Clear 11/22/2018 1430   LABSPEC 1.012 02/13/2017 1405   PHURINE 5.0 02/13/2017 1405   GLUCOSEU Negative 11/22/2018 1430   HGBUR NEGATIVE 02/13/2017 1405   BILIRUBINUR Negative 11/22/2018 1445   BILIRUBINUR Negative 11/22/2018 1430   KETONESUR NEGATIVE 02/13/2017 1405    PROTEINUR Negative 11/22/2018 1445   PROTEINUR Negative 11/22/2018 1430   PROTEINUR NEGATIVE 02/13/2017 1405   UROBILINOGEN 0.2 11/22/2018 1445   UROBILINOGEN 0.2 03/14/2015 0138   NITRITE Negative 11/22/2018 1445   NITRITE Negative 11/22/2018 1430   NITRITE NEGATIVE 02/13/2017 1405   LEUKOCYTESUR Negative 11/22/2018 1445   LEUKOCYTESUR Negative 11/22/2018 1430    Sepsis Labs: Lactic Acid, Venous No results found for: LATICACIDVEN  MICROBIOLOGY: Recent Results (from the past 240 hour(s))  Resp Panel by RT-PCR (Flu A&B, Covid) Nasopharyngeal Swab     Status: None   Collection Time: 01/02/21  5:15 PM   Specimen: Nasopharyngeal Swab; Nasopharyngeal(NP) swabs in vial transport medium  Result Value Ref Range Status   SARS Coronavirus 2 by RT PCR NEGATIVE NEGATIVE Final    Comment: (NOTE) SARS-CoV-2 target nucleic acids are NOT DETECTED.  The SARS-CoV-2 RNA is generally detectable in upper respiratory specimens during the acute phase of infection. The lowest concentration of SARS-CoV-2 viral copies this assay can detect is 138 copies/mL. A negative result does not preclude SARS-Cov-2 infection and should not be used as the sole basis for treatment or other patient management decisions. A negative result may occur with  improper specimen collection/handling, submission of specimen other than nasopharyngeal swab, presence of viral mutation(s) within the areas targeted by this assay, and inadequate number of viral copies(<138 copies/mL). A negative result must be combined with clinical observations, patient history, and epidemiological information. The expected result is Negative.  Fact Sheet for Patients:  BloggerCourse.com  Fact Sheet for Healthcare Providers:  SeriousBroker.it  This test is no t yet approved or cleared by the [place] FDA and  has been authorized for detection and/or diagnosis of SARS-CoV-2 by FDA under an  Emergency Use Authorization (EUA). This EUA will remain  in effect (meaning this test can be used) for the duration of the COVID-19 declaration under Section 564(b)(1) of the Act, 21 U.S.C.section 360bbb-3(b)(1), unless the authorization is terminated  or revoked sooner.       Influenza A by PCR NEGATIVE NEGATIVE Final   Influenza B by PCR NEGATIVE NEGATIVE Final    Comment: (NOTE) The Xpert Xpress SARS-CoV-2/FLU/RSV plus assay is intended as an aid in the diagnosis of influenza from Nasopharyngeal swab specimens and should not be used as a sole basis for treatment. Nasal washings and aspirates are unacceptable for Xpert Xpress SARS-CoV-2/FLU/RSV testing.  Fact Sheet for Patients: BloggerCourse.com  Fact Sheet for Healthcare Providers: SeriousBroker.it  This test is not yet approved or cleared by the [place] FDA and has been authorized for detection and/or diagnosis of SARS-CoV-2 by FDA under an Emergency Use Authorization (EUA). This EUA will remain in effect (meaning this test can be used) for the duration of the COVID-19 declaration under Section 564(b)(1) of the Act, 21 U.S.C. section 360bbb-3(b)(1), unless the authorization is terminated or revoked.  Performed at [facility], [address] [place], [place] [address]     RADIOLOGY STUDIES/RESULTS: CT HEAD WO CONTRAST ( )  Result Date: 01/03/2021 CLINICAL DATA:  Neuro deficit, acute, stroke suspected. 24 hr f/u after tPA. EXAM: CT HEAD WITHOUT CONTRAST TECHNIQUE: Contiguous axial images were obtained from the base of the skull through the vertex without intravenous contrast. COMPARISON:  Head CT and MRI 01/02/2021 FINDINGS: Brain: Hypodensity in the left middle frontal gyrus corresponds to the acute infarct on MRI. The small acute left parietal infarct on MRI is not well seen on CT. No intracranial hemorrhage, mass, midline shift, or extra-axial fluid  collection is identified. The ventricles are normal. Vascular: Calcified atherosclerosis at the skull base. Skull: No fracture or suspicious osseous lesion. Sinuses/Orbits: Visualized paranasal sinuses and mastoid air cells are clear. Visualized orbits are unremarkable. Other: None. IMPRESSION: 1. Acute left frontal infarct as seen on MRI. 2. No intracranial hemorrhage. Electronically Signed   By: Sebastian Ache M.D.   On: 01/03/2021 12:49   CT HEAD WO CONTRAST ( )  Result Date: 01/02/2021 CLINICAL DATA:  Cerebral hemorrhage suspected EXAM: CT HEAD WITHOUT CONTRAST TECHNIQUE: Contiguous axial images were obtained from the base of the skull through  the vertex without intravenous contrast. COMPARISON:  Same day CT head FINDINGS: Brain: No evidence of acute infarction, hemorrhage, hydrocephalus, extra-axial collection or mass lesion/mass effect. Remote left cerebellar lacunar infarcts. Vascular: No hyperdense vessel or unexpected calcification. Skull: Normal. Negative for fracture or focal lesion. Sinuses/Orbits: No acute finding. Other: None. IMPRESSION: No acute intracranial process. Electronically Signed   By: Allegra LaiLeah  Strickland MD   On: 01/02/2021 14:27   MR BRAIN WO CONTRAST  Result Date: 01/02/2021 CLINICAL DATA:  Acute neuro deficit. Slurred speech and right facial droop began earlier today. EXAM: MRI HEAD WITHOUT CONTRAST TECHNIQUE: Multiplanar, multiecho pulse sequences of the brain and surrounding structures were obtained without intravenous contrast. COMPARISON:  CT head 01/02/2021. CT head 01/02/2021. MRI head 12/26/2020 FINDINGS: Brain: Restricted diffusion in the left middle frontal gyrus compatible with acute infarct, with minimal associated FLAIR hyperintensity. Small area of acute infarct in the high left parietal cortex, with associated FLAIR hyperintensity. Ventricle size normal. Negative for hemorrhage or mass. Small chronic infarcts in the left cerebellum Vascular: Normal arterial flow voids  Skull and upper cervical spine: Negative Sinuses/Orbits: Negative Other: None IMPRESSION: Acute infarcts in the left middle frontal gyrus and left parietal gyrus. Probable emboli. These were not present on the prior MRI of 12/26/2020 Electronically Signed   By: Marlan Palauharles  Clark M.D.   On: 01/02/2021 14:34   ECHOCARDIOGRAM COMPLETE BUBBLE STUDY  Result Date: 01/04/2021    ECHOCARDIOGRAM REPORT   Patient Name:   Amber [name] Date of Exam: 01/04/2021 Medical Rec #:  [id number]       Height:       62.0 in Accession #:    [id number]      Weight:       176.6 lb Date of Birth:  [birth year]       BSA:          1.813 m Patient Age:    50 years        BP:           133/94 mmHg Patient Gender: F               HR:           78 bpm. Exam Location:  [facility] Procedure: 2D Echo, Color Doppler, Cardiac Doppler and Saline Contrast Bubble            Study Indications:     I63.9 Stroke  History:         Patient has prior history of Echocardiogram examinations. TIA;                  Risk Factors:Dyslipidemia. Asthma.  Sonographer:     Humphrey RollsJoan Heiss RDCS (AE) Referring Phys:  [id number] Rosalva FerronANA E GRAVES Diagnosing Phys: Debbe OdeaBrian Agbor-Etang MD  Sonographer Comments: No subcostal window. IMPRESSIONS  1. Left ventricular ejection fraction, by estimation, is 55 to 60%. The left ventricle has normal function. The left ventricle has no regional wall motion abnormalities. Left ventricular diastolic parameters are consistent with Grade I diastolic dysfunction (impaired relaxation).  2. Right ventricular systolic function is normal. The right ventricular size is normal.  3. The mitral valve is degenerative. Mild mitral valve regurgitation.  4. The aortic valve is tricuspid. Aortic valve regurgitation is not visualized.  5. Agitated saline contrast bubble study was negative, with no evidence of any interatrial shunt. FINDINGS  Left Ventricle: Left ventricular ejection fraction, by estimation, is 55 to 60%. The left ventricle has normal function. The left  ventricle has no  regional wall motion abnormalities. The left ventricular internal cavity size was normal in size. There is  no left ventricular hypertrophy. Left ventricular diastolic parameters are consistent with Grade I diastolic dysfunction (impaired relaxation). Right Ventricle: The right ventricular size is normal. No increase in right ventricular wall thickness. Right ventricular systolic function is normal. Left Atrium: Left atrial size was normal in size. Right Atrium: Right atrial size was normal in size. Pericardium: There is no evidence of pericardial effusion. Mitral Valve: The mitral valve is degenerative in appearance. There is mild thickening of the mitral valve leaflet(s). Mild mitral valve regurgitation. MV peak gradient, 3.0 mmHg. The mean mitral valve gradient is 1.0 mmHg. Tricuspid Valve: The tricuspid valve is grossly normal. Tricuspid valve regurgitation is not demonstrated. Aortic Valve: The aortic valve is tricuspid. Aortic valve regurgitation is not visualized. Aortic valve mean gradient measures 2.0 mmHg. Aortic valve peak gradient measures 3.3 mmHg. Aortic valve area, by VTI measures 3.04 cm. Pulmonic Valve: The pulmonic valve was not well visualized. Pulmonic valve regurgitation is not visualized. Aorta: The aortic root is normal in size and structure. Venous: The inferior vena cava was not well visualized. IAS/Shunts: No atrial level shunt detected by color flow Doppler. Agitated saline contrast was given intravenously to evaluate for intracardiac shunting. Agitated saline contrast bubble study was negative, with no evidence of any interatrial shunt.  LEFT VENTRICLE PLAX 2D LVIDd:         4.90 cm  Diastology LVIDs:         3.50 cm  LV e' medial:    7.40 cm/s LV PW:         1.00 cm  LV E/e' medial:  9.0 LV IVS:        0.80 cm  LV e' lateral:   6.64 cm/s LVOT diam:     1.90 cm  LV E/e' lateral: 10.0 LV SV:         46 LV SV Index:   25 LVOT Area:     2.84 cm  RIGHT VENTRICLE RV Basal  diam:  3.00 cm LEFT ATRIUM             Index       RIGHT ATRIUM          Index LA diam:        3.30 cm 1.82 cm/m  RA Area:     9.06 cm LA Vol (A2C):   33.8 ml 18.64 ml/m RA Volume:   18.90 ml 10.42 ml/m LA Vol (A4C):   16.9 ml 9.32 ml/m LA Biplane Vol: 25.0 ml 13.79 ml/m  AORTIC VALVE                   PULMONIC VALVE AV Area (Vmax):    2.70 cm    PV Vmax:       0.85 m/s AV Area (Vmean):   2.51 cm    PV Vmean:      59.400 cm/s AV Area (VTI):     3.04 cm    PV VTI:        0.161 m AV Vmax:           91.10 cm/s  PV Peak grad:  2.9 mmHg AV Vmean:          71.400 cm/s PV Mean grad:  2.0 mmHg AV VTI:            0.152 m AV Peak Grad:      3.3 mmHg AV Mean Grad:  2.0 mmHg LVOT Vmax:         86.80 cm/s LVOT Vmean:        63.100 cm/s LVOT VTI:          0.163 m LVOT/AV VTI ratio: 1.07  AORTA Ao Root diam: 3.20 cm MITRAL VALVE MV Area (PHT): 3.20 cm    SHUNTS MV Area VTI:   2.88 cm    Systemic VTI:  0.16 m MV Peak grad:  3.0 mmHg    Systemic Diam: 1.90 cm MV Mean grad:  1.0 mmHg MV Vmax:       0.86 m/s MV Vmean:      54.4 cm/s MV Decel Time: 237 msec MV E velocity: 66.40 cm/s MV A velocity: 79.30 cm/s MV E/A ratio:  0.84 Debbe Odea MD Electronically signed by Debbe Odea MD Signature Date/Time: 01/04/2021/11:28:09 AM    Final      LOS: 2 days   Jeoffrey Massed, MD  [facility]    To contact the attending provider between 7A-7P or the covering provider during after hours 7P-7A, please log into the web site [email or web address] and access using universal [place] password for that web site. If you do not have the password, please call the hospital operator.  01/04/2021, 12:45 PM

## 2021-01-04 NOTE — Progress Notes (Signed)
Physical Therapy Treatment Patient Details Name: Amber [name] MRN: [id number] DOB: May 26, 1971 Today's Date: 01/04/2021    History of Present Illness Per MD notes, pt is a 50 year old woman who presented to the emergency department with acute onset expressive aphasia. Code stroke initiated 8/5 in the ED and transfered to ICU. PMH includes multiple remote cerebellar strokes, hyperlipidemia, anxiety, depression, asthma, tension headache. MD assessment includes acute CVA and is s/p tPA.    PT Comments    Pt was pleasant and motivated to participate during the session. Pt gave good effort throughout session but needed multiple rest breaks secondary to fatigue. Pt still exhibits expressive aphasia. Pt performed functional mobility with supervision-min A. Pt able to maintain static standing balance in below mentioned positions with min guard-mod A requiring continuous min-mod A for semi-tandem balance. Pt performed dynamic standing balance in below mentioned positions with min guard and intermittent min A. Pt exhibited tendency to look up with balance activities and close eyes require intermittent verbal cues to open eyes and look forward. Pt ambulated using RW with increased speed and overall stability but demonstrated drifting to the right and needed intermittent standing rest breaks. Pt walked short limited distance without AD requiring intermittent min-mod A to maintain standing balance. Pt is still limited with functional mobility secondary to generalized weakness, fatigue, and fear of falling. Pt will benefit from PT services in a CIR setting upon discharge to safely address deficits listed in patient problem list for decreased caregiver assistance and eventual return to PLOF.     Follow Up Recommendations  CIR;Supervision for mobility/OOB     Equipment Recommendations  Other (comment) (TBD)    Recommendations for Other Services       Precautions / Restrictions Precautions Precautions:  Fall Restrictions Weight Bearing Restrictions: No    Mobility  Bed Mobility Overal bed mobility: Needs Assistance Bed Mobility: Supine to Sit     Supine to sit: Supervision;HOB elevated          Transfers Overall transfer level: Needs assistance Equipment used: Rolling walker (2 wheeled) Transfers: Sit to/from Stand Sit to Stand: Min guard         General transfer comment: Min guard for increased safety  Ambulation/Gait Ambulation/Gait assistance: Min guard;Min assist;Mod assist Gait Distance (Feet): 130 Feet Assistive device: Rolling walker (2 wheeled);None Gait Pattern/deviations: Step-through pattern;Decreased stride length;Narrow base of support;Drifts right/left Gait velocity: decreased   General Gait Details: Pt demonstrated increased speed compared to last visit maintaining step-through pattern throughout walk. She required intermittent standing rest breaks secondary to fatigue. Pt still demonstrated narrow base of support but less excessive than last visit and remained overall steady using RW without LOB. Demonstrated drifting to the right but able to self correct with verbal cue. Pt able to walk 40ft without AD but with very decreased speed and short step-to pattern with min guard-mod A.    Stairs             Wheelchair Mobility    Modified Rankin (Stroke Patients Only)       Balance Overall balance assessment: Needs assistance Sitting-balance support: Feet unsupported;No upper extremity supported Sitting balance-Leahy Scale: Good Sitting balance - Comments: Supervision at EOB   Standing balance support: No upper extremity supported;Bilateral upper extremity supported Standing balance-Leahy Scale: Fair Standing balance comment: Good static standing, Fair with dynamic balance, very fearful of falling               High Level Balance Comments: Pt able to perform static  standing feet apart and feet together with min guard for >30sec. Pt  performed dynamic balance reaching across body at chest height requiring min guard and intermittent min A to maintain balance. Pt unable to maintain static standing in semi-tandem bilaterally without min-mod A with more difficulty with right foot posterior to left foot compared to other side.            Cognition Arousal/Alertness: Awake/alert Behavior During Therapy: WFL for tasks assessed/performed Overall Cognitive Status: Within Functional Limits for tasks assessed                                 General Comments: expressive difficulties, but pt is appropraite throughout session      Exercises Total Joint Exercises Long Arc Quad: AROM;Strengthening;Both;10 reps;Seated Marching in Standing: AROM;Strengthening;Both;10 reps;Seated Other Exercises Other Exercises: Static standing balance including feet apart, feet together, and semi-tandem for improved balance. Other Exercises: Dynamic balance training reaching across body at chest level with feet apart and feet together.    General Comments        Pertinent Vitals/Pain Pain Assessment: No/denies pain    Home Living                      Prior Function            PT Goals (current goals can now be found in the care plan section) Progress towards PT goals: Progressing toward goals    Frequency    7X/week      PT Plan Current plan remains appropriate    Co-evaluation              AM-PAC PT "6 Clicks" Mobility   Outcome Measure  Help needed turning from your back to your side while in a flat bed without using bedrails?: None Help needed moving from lying on your back to sitting on the side of a flat bed without using bedrails?: A Little Help needed moving to and from a bed to a chair (including a wheelchair)?: A Little Help needed standing up from a chair using your arms (e.g., wheelchair or bedside chair)?: A Little Help needed to walk in hospital room?: A Little Help needed  climbing 3-5 steps with a railing? : A Lot 6 Click Score: 18    End of Session Equipment Utilized During Treatment: Gait belt Activity Tolerance: Patient tolerated treatment well Patient left: in bed;with call bell/phone within reach;with bed alarm set;with family/visitor present (Left sitting EOB) Nurse Communication: Mobility status (Notified nurse pt was left sitting on EOB with bed alarm on and family present.) PT Visit Diagnosis: Unsteadiness on feet (R26.81);Muscle weakness (generalized) (M62.81);Difficulty in walking, not elsewhere classified (R26.2);Hemiplegia and hemiparesis Hemiplegia - Right/Left: Left Hemiplegia - dominant/non-dominant: Non-dominant Hemiplegia - caused by: Cerebral infarction     Time: 9622-2979 PT Time Calculation (min) (ACUTE ONLY): 49 min  Charges:                       Desiree Hane SPT 01/04/21, 1:43 PM

## 2021-01-04 NOTE — Progress Notes (Signed)
Neurology Progress Note  Patient ID: 50 year old woman with a past medical history significant for multiple remote cerebellar strokes, hyperlipidemia, anxiety, depression, asthma, tension headache presented to ED 01/03/21 after acute onset of expressive aphasia and received tPA 2/2 c/f acute ischemic stroke. MRI brain showed acute infarcts L middle frontal gyrus and L parietal gyrus, suspect embolic source.  Subjective: - Patient's expressive aphasia has improved but not resolved - She is highly motivated and is tentatively planned for discharge to acute rehab - No new neurologic complaints today  Interval data:  TTE: no intracardiac clot, no PFO  TTE pending  A1c 6.2 LDL 100   Objective  Vitals:   01/04/21 0721 01/04/21 1141  BP: 125/90 (!) 118/94  Pulse: 88 91  Resp: 20 16  Temp: 97.8 F (36.6 C) 97.9 F (36.6 C)  SpO2: 99% 98%   Physical Exam Gen: alert, able to follow commands, oriented x4 HEENT: Atraumatic, normocephalic;mucous membranes moist; oropharynx clear, tongue without atrophy or fasciculations. Neck: Supple, trachea midline. Resp: CTAB, no w/r/r CV: RRR, no m/g/r; nml S1 and S2. 2+ symmetric peripheral pulses. Abd: soft/NT/ND; nabs x 4 quad Extrem: Nml bulk; no cyanosis, clubbing, or edema.   Neuro: *MS: alert, able to follow commands, oriented x4 *Speech: moderate expressive aphasia, significantly improved from yesterday, minimal dysarthria *CN:   I: Deferred   II,III: PERRLA, VFF by confrontation, optic discs not visualized 2/2 pupillary constriction   III,IV,VI: EOMI w/o nystagmus, no ptosis   V: Sensation intact from V1 to V3 to LT   VII: Eyelid closure was full.  Smile symmetric.   VIII: Hearing intact to voice   IX,X: Voice normal, palate elevates symmetrically   XI: SCM/trap 5/5 bilat   XII: Tongue protrudes midline, no atrophy or fasciculations   *Motor:   Normal bulk.  No tremor, rigidity or bradykinesia. No pronator drift.      Strength: Dlt Bic Tri WrE WrF FgS Gr HF KnF KnE PlF DoF   Left 5 5 5 5 5 5 5 5 5 5 5 5    Right 5 5 5 5 5 5 5 5 5 5 5 5      *Sensory: Intact to light touch, pinprick, temperature vibration throughout. Symmetric. Propioception intact bilat.  No double-simultaneous extinction. *Coordination:  UTA 2/2 confusion *Reflexes:  2+ and symmetric throughout without clonus; toes down-going bilat *Gait: deferred   NIH stroke scale is 2 today (from 4 yesterday)     Premorbid mRS = 74  A/P: 50 year old woman with pmhx multiple ischemic strokes (not including the 2 new infarcts from yesterday she has already had 6 prior ischemic strokes (3 L cerebellar, biparietal, and L frontal)), HL, anxiety, depression who was admitted 01/04/21 with acute onset expressive aphasia and received tPA. MRI brain confirmed 2 new areas of acute infarct (L frontal and L parietal). Her stroke patterns appear embolic and she is clearly too young to be having so many strokes. She previously followed with Dr. 52 at [facility] She has had extensive w/u for cryptogenic stroke in the past including pan-neg hypercoag workup and several years of ILR recordings not showing a fib. TTE today showed no intracardiac clot and no PFO. I would request cardiology perform a TEE to further evaluate for any intracardiac clot. If negative, patient may be discharged to rehab with close f/u with Dr. Pearlean Brownie (I will place new referral since she was last seen in 2018).  - For now, recommend ASA 81mg  daily + plavix 75mg  daily x21 days  f/b plavix 75mg  daily monotherapy after that - TEE scheduled for tmrw AM. If e/o intracardiac clot, she should be started on anticoagulation. If she is started on anticoagulation she should discontinue her antiplatelets. -Agree with aggressive SLP/OT/PT - acute rehab would be a great option for her - I will place re-referral to Dr. for outpatient neuro f/u  We will f/u results of TEE tmrw. After that her inpatient workup will be  complete and we will arrange for close f/u with Dr. Pearlean Brownie for continued w/u of cryptogenic stroke.  Pearlean Brownie, MD [facility] [phone]  If 7pm- 7am, please page neurology on call as listed in AMION.

## 2021-01-04 NOTE — Progress Notes (Signed)
Inpatient Rehab Admissions Coordinator Note:   Per therapy recommendations, pt was screened for CIR candidacy by Megan Salon, MS CCC-SLP. At this time, Pt. Appears to have functional decline and is a good candidate for CIR. Will place order for rehab consult per protocol. Note that Pt. Is already ambulating at min guard assist, so she may progress beyond needing CIR before a bed is available. Please contact me with questions.   Megan Salon, MS, CCC-SLP Rehab Admissions Coordinator  [phone] (celll) [phone] (office)

## 2021-01-05 LAB — HOMOCYSTEINE: Homocysteine: 5.8 umol/L (ref 0.0–14.5)

## 2021-01-05 LAB — ANA COMPREHENSIVE PANEL
Anti JO-1: <0.2 AI (ref 0.0–0.9)
Centromere Ab Screen: <0.2 AI (ref 0.0–0.9)
Chromatin Ab SerPl-aCnc: 0.6 AI (ref 0.0–0.9)
ENA SM Ab Ser-aCnc: <0.2 AI (ref 0.0–0.9)
Ribonucleic Protein: 0.4 AI (ref 0.0–0.9)
SSA (Ro) (ENA) Antibody, IgG: 0.4 AI (ref 0.0–0.9)
SSB (La) (ENA) Antibody, IgG: <0.2 AI (ref 0.0–0.9)
Scleroderma (Scl-70) (ENA) Antibody, IgG: <0.2 AI (ref 0.0–0.9)
ds DNA Ab: <1 IU/mL (ref 0–9)

## 2021-01-05 LAB — GLUCOSE, CAPILLARY: Glucose-Capillary: 101 mg/dL — ABNORMAL HIGH (ref 70–99)

## 2021-01-05 NOTE — Progress Notes (Signed)
Physical Therapy Treatment Patient Details Name: Amber [name] MRN: [id number] DOB: 27-Mar-1971 Today's Date: 01/05/2021    History of Present Illness Per MD notes, pt is a 50 year old woman who presented to the emergency department with acute onset expressive aphasia. Code stroke initiated 8/5 in the ED and transfered to ICU. PMH includes multiple remote cerebellar strokes, hyperlipidemia, anxiety, depression, asthma, tension headache. MD assessment includes acute CVA and is s/p tPA.    PT Comments    Pt was pleasant and motivated to participate during the session. Pt gave good effort throughout session. Pt demonstrated increased balance compared to last visit being able to perform static standing balance in semi-tandem with min guard to intermittent min A secondary to LOB to the left side. Pt able to maintain looking down during dynamic balance activities with min verbal cues. Pt still reported some fear of falling but felt more confident with ambulation and standing activities. Pt was able to ambulate with SPC with min guard and intermittent min A to correct for a few instances slight LOB specifically with sharp turns. Pt is still very limited with functional mobility compared to baseline independent function secondary to generalized weakness, instability, and fatigue. Pt will benefit from PT services in a CIR setting upon discharge to safely address deficits listed in patient problem list for decreased caregiver assistance and eventual return to PLOF.   Follow Up Recommendations  CIR;Supervision for mobility/OOB     Equipment Recommendations  Other (comment) (TBD)    Recommendations for Other Services       Precautions / Restrictions Precautions Precautions: Fall Restrictions Weight Bearing Restrictions: No    Mobility  Bed Mobility Overal bed mobility: Needs Assistance Bed Mobility: Supine to Sit;Sit to Supine     Supine to sit: Supervision;HOB elevated Sit to supine:  Supervision        Transfers Overall transfer level: Needs assistance Equipment used: Rolling walker (2 wheeled);Straight cane (Sit>stand w/rw; stand>sit w/SPC) Transfers: Sit to/from Stand Sit to Stand: Min guard;From elevated surface         General transfer comment: Verbal cues for sequencing.  Ambulation/Gait Ambulation/Gait assistance: Min guard;Min assist Gait Distance (Feet): 70 Feet Assistive device: Straight cane Gait Pattern/deviations: Step-to pattern;Step-through pattern;Decreased stride length;Drifts right/left;Narrow base of support Gait velocity: decreased   General Gait Details: Pt began with step-to pattern using SPC and was able to transition to step-through pattern half way through the walk. Pt still demonstrates narrow BOS and required min A for sharp turns to maintain standing balance.   Stairs             Wheelchair Mobility    Modified Rankin (Stroke Patients Only)       Balance Overall balance assessment: Needs assistance Sitting-balance support: No upper extremity supported;Feet supported Sitting balance-Leahy Scale: Good Sitting balance - Comments: Supervision at EOB   Standing balance support: No upper extremity supported;Single extremity supported;During functional activity Standing balance-Leahy Scale: Fair Standing balance comment: Pt required CGA-min A to correct balance during dynamic balance activities without UE support.               High Level Balance Comments: Pt able to perform static standing feet together and semi-tandem for >30sec with min guard-min A to correct staning posture demonstrating intermittent lean and slight LOB to the left side. Pt performed dynamic balance reaching across body at stomach height to encourage looking down instead of tendecy to look up requiring min guard and intermittent min A to maintain balance.  Cognition Arousal/Alertness: Awake/alert Behavior During Therapy: WFL for  tasks assessed/performed Overall Cognitive Status: Within Functional Limits for tasks assessed                                 General Comments: Still exhibits expressive aphasia but improving      Exercises Total Joint Exercises Long Arc Quad: AROM;Strengthening;Both;10 reps;Seated Marching in Standing: AROM;Strengthening;Both;10 reps;Seated Other Exercises Other Exercises: Static standing balance including feet together and semi-tandem for improved balance. Other Exercises: Dynamic balance training reaching across body at stomach level with feet apart and feet together.     General Comments        Pertinent Vitals/Pain Pain Assessment: No/denies pain    Home Living                      Prior Function            PT Goals (current goals can now be found in the care plan section) Acute Rehab PT Goals Patient Stated Goal: be able to do more for herself and make it to 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 Progress towards PT goals: Progressing toward goals    Frequency    7X/week      PT Plan Current plan remains appropriate    Co-evaluation              AM-PAC PT "6 Clicks" Mobility   Outcome Measure  Help needed turning from your back to your side while in a flat bed without using bedrails?: None Help needed moving from lying on your back to sitting on the side of a flat bed without using bedrails?: A Little Help needed moving to and from a bed to a chair (including a wheelchair)?: A Little Help needed standing up from a chair using your arms (e.g., wheelchair or bedside chair)?: A Little Help needed to walk in hospital room?: A Little Help needed climbing 3-5 steps with a railing? : A Lot 6 Click Score: 18    End of Session Equipment Utilized During Treatment: Gait belt Activity Tolerance: Patient tolerated treatment well Patient left: in bed;with call bell/phone within reach;with bed  alarm set Nurse Communication: Mobility status PT Visit Diagnosis: Unsteadiness on feet (R26.81);Muscle weakness (generalized) (M62.81);Difficulty in walking, not elsewhere classified (R26.2);Hemiplegia and hemiparesis Hemiplegia - Right/Left: Left Hemiplegia - dominant/non-dominant: Non-dominant Hemiplegia - caused by: Cerebral infarction     Time: 1051-1130 PT Time Calculation (min) (ACUTE ONLY): 39 min  Charges:                        Desiree Hane SPT 01/05/21, 12:08 PM

## 2021-01-05 NOTE — TOC Initial Note (Signed)
Transition of Care [facility]) - Initial/Assessment Note    Patient Details  Name: Amber [name] MRN: [id number] Date of Birth: [birth year]  Transition [facility]) CM/SW Contact:    Allayne Butcher, RN Phone Number: 01/05/2021, 3:32 PM  Clinical Narrative:                 Patient is a good candidate for CIR.  CIR has started insurance authorization.    Expected Discharge Plan: IP Rehab Facility Barriers to Discharge: Insurance Authorization   Patient Goals and CMS Choice Patient states their goals for this hospitalization and ongoing recovery are:: patient agrees to CIR CMS Medicare.gov Compare Post Acute Care list provided to:: Patient Choice offered to / list presented to : Patient  Expected Discharge Plan and Services Expected Discharge Plan: IP Rehab Facility     Post Acute Care Choice: IP Rehab                   DME Arranged: N/A DME Agency: NA       HH Arranged: NA HH Agency: NA        Prior Living Arrangements/Services       Do you feel safe going back to the place where you live?: Yes               Activities of Daily Living Home Assistive Devices/Equipment: None ADL Screening (condition at time of admission) Patient's cognitive ability adequate to safely complete daily activities?: Yes Is the patient deaf or have difficulty hearing?: No Does the patient have difficulty seeing, even when wearing glasses/contacts?: No Does the patient have difficulty concentrating, remembering, or making decisions?: No Patient able to express need for assistance with ADLs?: Yes Does the patient have difficulty dressing or bathing?: Yes Independently performs ADLs?: No Communication: Independent Dressing (OT): Needs assistance Is this a change from baseline?: Change from baseline, expected to last >3 days Grooming: Needs assistance Is this a change from baseline?: Change from baseline, expected to last >3 days Feeding: Independent Bathing: Needs assistance Is this  a change from baseline?: Change from baseline, expected to last >3 days Toileting: Needs assistance Is this a change from baseline?: Change from baseline, expected to last >3days In/Out Bed: Needs assistance Is this a change from baseline?: Change from baseline, expected to last >3 days Walks in Home: Independent with device (comment) Does the patient have difficulty walking or climbing stairs?: Yes Weakness of Legs: Left Weakness of Arms/Hands: Left  Permission Sought/Granted Permission sought to share information with : Case Manager, Magazine features editor Permission granted to share information with : Yes, Verbal Permission Granted     Permission granted to share info w AGENCY: CIR        Emotional Assessment       Orientation: : Oriented to Self, Oriented to Place, Oriented to  Time, Oriented to Situation Alcohol / Substance Use: Not Applicable Psych Involvement: No (comment)  Admission diagnosis:  Slurred speech [R47.81] Stroke [facility]) [I63.9] Acute CVA (cerebrovascular accident) [facility]) [I63.9] Patient Active Problem List   Diagnosis Date Noted   Cryptogenic stroke (HCC)    Expressive aphasia    Slurred speech    Acute CVA (cerebrovascular accident) (HCC) 01/02/2021   Dizziness 12/18/2020   Tension headache 11/18/2020   Acute neck pain 11/18/2020   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   Splinter in skin, left foot 12/11/2019   Allergic rhinitis due to pollen 10/15/2019   Concentration deficit 08/14/2019   Non-cardiac chest pain 05/18/2019  Anxiety 04/13/2019   Weight 03/06/2019   Right flank pain 11/22/2018   Left shoulder pain 08/02/2018   Word finding difficulty 02/27/2018   Restless leg syndrome 12/29/2016   GERD (gastroesophageal reflux disease) 08/24/2016   Hemorrhoids 08/24/2016   Healthcare maintenance 08/24/2016   GAD (generalized anxiety disorder) 06/22/2016   Levoscoliosis 10/06/2015   Type 2 diabetes mellitus (HCC) 09/08/2015   Fatigue  04/03/2015   History of stroke 03/14/2015   IBS (irritable bowel syndrome) 09/29/2014   Hyperlipidemia 09/29/2014   Essential hypertension 09/29/2014   PCP:  Doreene Nest, NP Pharmacy:   [facility] [address], [place] - [address] [place] [address] Phone: [phone] Fax: [phone]     Social Determinants of Health (SDOH) Interventions    Readmission Risk Interventions No flowsheet data found.

## 2021-01-05 NOTE — Progress Notes (Addendum)
Inpatient Rehabilitation Admissions Coordinator   Inpatient rehab consult received. I contacted pt's room and spoke with her Mom by phone. I have left my number for patient to return my call to begin discussions concerning her rehab venue options and needs.  Ottie Glazier, RN, MSN Rehab Admissions Coordinator [phone] 01/05/2021 10:29 AM  I spoke with both patient and her Mom by phone to discuss goals and expectations of a possible CIR admit. They both are in agreement. I will begin insurance Auth with BCBS and Medicaid [facility] today.  Ottie Glazier, RN, MSN Rehab Admissions Coordinator [phone] 01/05/2021 10:51 AM

## 2021-01-05 NOTE — Progress Notes (Signed)
Occupational Therapy Treatment Patient Details Name: Amber [name] MRN: [id number] DOB: [birth year] Today's Date: 01/05/2021    History of present illness Per MD notes, pt is a 50 year old woman who presented to the emergency department with acute onset expressive aphasia. Code stroke initiated 8/5 in the ED and transfered to ICU. PMH includes multiple remote cerebellar strokes, hyperlipidemia, anxiety, depression, asthma, tension headache. MD assessment includes acute CVA and is s/p tPA.   OT comments  Pt seen for OT tx this date. 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 Pt performs bed mobility with supervision for safety, MIN A - CGA for ADL transfers from EOB, standard height toilet + VC for hand placement and wider base of support. Pt tolerated standing at sink for grooming tasks, intermittent UE support on countertop for stability, pt using L hand to manipulate toothpaste to squeeze onto toothbrush + increased time/effort. Pt ambulated from sink to bathroom + handheld assist on L side with MIN A, VC to keep eyes open, VC for visually attending more to L side for safety. MIN A to perform 2/2 decreased balance and narrow base of support requiring VC to correct. Pt performed seated sink bath with supervision and set up, CGA - MIN A while in standing for balance. Increased time/effort to perform, incorporating LUE into bilat tasks without cues. HR up to 129 with exertion. Pt denied fatigue, felt "refreshed" after bathing and applying deodorant. Pt remains appropriate for high intensity skilled OT Services. Continue to recommend CIR upon discharge to maximize independence and return to work.    Follow Up Recommendations  CIR    Equipment Recommendations       Recommendations for Other Services      Precautions / Restrictions Precautions Precautions: Fall Restrictions Weight Bearing Restrictions: No       Mobility Bed  Mobility Overal bed mobility: Needs Assistance Bed Mobility: Supine to Sit;Sit to Supine     Supine to sit: Supervision;HOB elevated Sit to supine: Supervision        Transfers Overall transfer level: Needs assistance Equipment used: 1 person hand held assist Transfers: Sit to/from Stand Sit to Stand: Min assist;Min guard         General transfer comment: MIN A for initial transfers EOB, CGA-MIN for subsequent transfers, VC for LUE hand placement on EOB/bed rail    Balance Overall balance assessment: Needs assistance Sitting-balance support: No upper extremity supported;Feet supported Sitting balance-Leahy Scale: Good Sitting balance - Comments: Supervision at EOB during seated bath   Standing balance support: No upper extremity supported;Single extremity supported;During functional activity Standing balance-Leahy Scale: Fair Standing balance comment: fair-, demo'd a couple slight LOB requiring CGA-MIN A to correct from therapist, increased difficulty with dynamic balance                           ADL either performed or assessed with clinical judgement   ADL Overall ADL's : Needs assistance/impaired     Grooming: Wash/dry hands;Wash/dry face;Oral care;Standing;Min guard Grooming Details (indicate cue type and reason): intermittent cues for safety, increased problem solving Upper Body Bathing: Sitting;Supervision/ safety;Set up Upper Body Bathing Details (indicate cue type and reason): increased time/effort, but does incorporate LUE into task well Lower Body Bathing: Sit to/from stand;Sitting/lateral leans;Min guard Lower Body Bathing Details (indicate cue type and reason): cues for LUE hand placement to support transfer, increased time/effort  Lower Body Dressing: Sit to/from stand;Min guard;Minimal assistance   Toilet Transfer: Min Engineer, building services;Ambulation Toilet Transfer Details (indicate cue type and reason): cues for wider  base of support Toileting- Clothing Manipulation and Hygiene: Supervision/safety;Sitting/lateral lean       Functional mobility during ADLs: Min guard;Minimal assistance (handheld assist on L side)       Vision   Additional Comments: PRN VC for keeping eyes open, visually attending to L side and down to negotiate obstacles in room more safely   Perception     Praxis      Cognition Arousal/Alertness: Awake/alert Behavior During Therapy: WFL for tasks assessed/performed Overall Cognitive Status: Within Functional Limits for tasks assessed                                 General Comments: increased problem solving time, PRN VC for safety to keep eyes open        Exercises     Shoulder Instructions       General Comments      Pertinent Vitals/ Pain       Pain Assessment: No/denies pain  Home Living                                          Prior Functioning/Environment              Frequency  Min 3X/week        Progress Toward Goals  OT Goals(current goals can now be found in the care plan section)  Progress towards OT goals: Progressing toward goals  Acute Rehab OT Goals Patient Stated Goal: be able to do more for herself and make it to 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 OT Goal Formulation: With patient/family Time For Goal Achievement: 01/17/21 Potential to Achieve Goals: Good  Plan Discharge plan remains appropriate;Frequency remains appropriate    Co-evaluation                 AM-PAC OT "6 Clicks" Daily Activity     Outcome Measure   Help from another person eating meals?: None Help from another person taking care of personal grooming?: A Little Help from another person toileting, which includes using toliet, bedpan, or urinal?: A Little Help from another person bathing (including washing, rinsing, drying)?: A Little Help from another person to put on and  taking off regular upper body clothing?: A Little Help from another person to put on and taking off regular lower body clothing?: A Little 6 Click Score: 19    End of Session Equipment Utilized During Treatment: Gait belt  OT Visit Diagnosis: Unsteadiness on feet (R26.81);Other symptoms and signs involving the nervous system (R29.898);Hemiplegia and hemiparesis Hemiplegia - Right/Left: Left Hemiplegia - dominant/non-dominant: Non-Dominant Hemiplegia - caused by: Cerebral infarction   Activity Tolerance Patient tolerated treatment well   Patient Left in bed;with call bell/phone within reach;with bed alarm set;with family/visitor present   Nurse Communication          Time: 6433-2951 OT Time Calculation (min): 55 min  Charges: OT General Charges $OT Visit: 1 Visit OT Treatments $Self Care/Home Management : 53-67 mins  Wynona Canes, MPH, MS, OTR/L ascom [phone] 01/05/21, 11:35 AM

## 2021-01-05 NOTE — Plan of Care (Signed)

## 2021-01-05 NOTE — Progress Notes (Signed)
PROGRESS NOTE        PATIENT DETAILS Name: Amber BarrowsLatasha [name] Age: 50 y.o. Sex: female Date of Birth: [birth year] Admit Date: 01/02/2021 Admitting Physician Vida RiggerFuad Aleskerov, MD ZOX:WRUEAPCP:Clark, Keane ScrapeKatherine K, NP  Brief Narrative: Patient is a 50 y.o. female with prior history of cryptogenic CVA (underwent extensive work-up in 2016) who presented with acute expressive aphasia-received tPA-monitored in the ICU-[facility]  See below for further details.    Significant events: 8/5>> admit-expressive aphasia-acute CVA-received tPA-sent to ICU 8/7>> [facility]  Significant studies: 8/5>> MRI brain: Acute infarct in the left middle frontal, left parietal areas. 8/5>> CT angio head/neck: No evidence of LVO or hemodynamically significant stenosis. 8/5>> A1c 6.2 8/5>> LDL: 86  Antimicrobial therapy: None  Microbiology data: None  Procedures : None  Consults: Neurology, PCCM  DVT Prophylaxis : enoxaparin (LOVENOX) injection 40 mg Start: 01/04/21 2200 SCDs Start: 01/02/21 1310   Subjective: No major issues-is hungry-but is NPO for TEE-remains unchanged with speech abnormalities.  Assessment/Plan: Acute CVA: continues to have expressive aphasia-although slowly improving. Work in Optometristprogress-awating TEE today. Neuro recommends ASA/Plavix x 3 weeks, following by Plavix alone. CIR planned on discharge  History of bronchial asthma: Stable without any exacerbation  Morbid Obesity: Estimated body mass index is 32.74 kg/m as calculated from the following:   Height as of this encounter: 5\' 2"  (1.575 m).   Weight as of this encounter: 81.2 kg.     Diet: Diet Order             Diet NPO time specified  Diet effective now                    Code Status: Full code   Family Communication: Mother at bedside   Disposition Plan: Status is: Inpatient  Remains inpatient appropriate because:Inpatient level of care  appropriate due to severity of illness  Dispo: The patient is from: Home              Anticipated d/c is to: Home              Patient currently is not medically stable to d/c.   Difficult to place patient No    Barriers to Discharge: Needs TEE prior to discharge.  Antimicrobial agents: Anti-infectives (From admission, onward)    None        Time spent: 25-minutes-Greater than 50% of this time was spent in counseling, explanation of diagnosis, planning of further management, and coordination of care.  MEDICATIONS: Scheduled Meds:  vitamin C  1,000 mg Oral Daily   aspirin EC  81 mg Oral Daily   cholecalciferol  1,000 Units Oral Daily   clopidogrel  75 mg Oral Daily   enoxaparin (LOVENOX) injection  40 mg Subcutaneous Q24H   multivitamin with minerals  1 tablet Oral Daily   pantoprazole (PROTONIX) IV  40 mg Intravenous QHS   rosuvastatin  40 mg Oral Daily   sodium chloride flush  3 mL Intravenous Once   Continuous Infusions:   PRN Meds:.acetaminophen, albuterol, aspirin-acetaminophen-caffeine, docusate sodium, ondansetron (ZOFRAN) IV, oxyCODONE, polyethylene glycol   PHYSICAL EXAM: Vital signs: Vitals:   01/04/21 2347 01/05/21 0430 01/05/21 0500 01/05/21 0726  BP: (!) 129/96 117/82  116/90  Pulse: (!) 107 81  75  Resp: 16 18  17   Temp: 97.7 F (  36.5 C) 98.1 F (36.7 C)  98.1 F (36.7 C)  TempSrc: Oral Oral    SpO2: 98% 95%  98%  Weight:   81.2 kg   Height:       Filed Weights   01/03/21 0409 01/04/21 2014 01/05/21 0500  Weight: 80.1 kg 78.5 kg 81.2 kg   Body mass index is 32.74 kg/m.   Gen Exam:+ expressive aphasia-not in any distress HEENT:atraumatic, normocephalic Chest: B/L clear to auscultation anteriorly CVS:S1S2 regular Abdomen:soft non tender, non distended Extremities:no edema Neurology: Non focal Skin: no rash   I have personally reviewed following labs and imaging studies  LABORATORY DATA: CBC: Recent Labs  Lab 01/02/21 1343  01/03/21 0446 01/04/21 0419  WBC 9.5 6.2 5.9  NEUTROABS 6.5  --   --   HGB 14.2 13.9 14.0  HCT 43.4 43.7 42.6  MCV 87.9 91.2 89.5  PLT 210 206 231     Basic Metabolic Panel: Recent Labs  Lab 01/02/21 1343 01/03/21 0446 01/04/21 0419  NA 141 143 142  K 4.0 3.5 3.5  CL 106 112* 108  CO2 26 27 28   GLUCOSE 98 109* 97  BUN 10 9 12   CREATININE 0.89 0.92 0.90  CALCIUM 9.9 9.0 8.9  MG  --  1.9 2.1  PHOS  --  4.1 4.1     GFR: Estimated Creatinine Clearance: 75.4 mL/min (by C-G formula based on SCr of 0.9 mg/dL).  Liver Function Tests: Recent Labs  Lab 01/02/21 1343  AST 21  ALT 28  ALKPHOS 74  BILITOT 0.7  PROT 7.5  ALBUMIN 3.7    No results for input(s): LIPASE, AMYLASE in the last 168 hours. No results for input(s): AMMONIA in the last 168 hours.  Coagulation Profile: Recent Labs  Lab 01/02/21 1343  INR 1.1     Cardiac Enzymes: No results for input(s): CKTOTAL, CKMB, CKMBINDEX, TROPONINI in the last 168 hours.  BNP (last 3 results) No results for input(s): PROBNP in the last 8760 hours.  Lipid Profile: Recent Labs    01/02/21 1343  CHOL 181  HDL 76  LDLCALC 86  TRIG 94  CHOLHDL 2.4     Thyroid Function Tests: No results for input(s): TSH, T4TOTAL, FREET4, T3FREE, THYROIDAB in the last 72 hours.  Anemia Panel: No results for input(s): VITAMINB12, FOLATE, FERRITIN, TIBC, IRON, RETICCTPCT in the last 72 hours.  Urine analysis:    Component Value Date/Time   COLORURINE YELLOW 02/13/2017 1405   APPEARANCEUR Clear 11/22/2018 1430   LABSPEC 1.012 02/13/2017 1405   PHURINE 5.0 02/13/2017 1405   GLUCOSEU Negative 11/22/2018 1430   HGBUR NEGATIVE 02/13/2017 1405   BILIRUBINUR Negative 11/22/2018 1445   BILIRUBINUR Negative 11/22/2018 1430   KETONESUR NEGATIVE 02/13/2017 1405   PROTEINUR Negative 11/22/2018 1445   PROTEINUR Negative 11/22/2018 1430   PROTEINUR NEGATIVE 02/13/2017 1405   UROBILINOGEN 0.2 11/22/2018 1445   UROBILINOGEN 0.2  03/14/2015 0138   NITRITE Negative 11/22/2018 1445   NITRITE Negative 11/22/2018 1430   NITRITE NEGATIVE 02/13/2017 1405   LEUKOCYTESUR Negative 11/22/2018 1445   LEUKOCYTESUR Negative 11/22/2018 1430    Sepsis Labs: Lactic Acid, Venous No results found for: LATICACIDVEN  MICROBIOLOGY: Recent Results (from the past 240 hour(s))  Resp Panel by RT-PCR (Flu A&B, Covid) Nasopharyngeal Swab     Status: None   Collection Time: 01/02/21  5:15 PM   Specimen: Nasopharyngeal Swab; Nasopharyngeal(NP) swabs in vial transport medium  Result Value Ref Range Status   SARS Coronavirus 2  by RT PCR NEGATIVE NEGATIVE Final    Comment: (NOTE) SARS-CoV-2 target nucleic acids are NOT DETECTED.  The SARS-CoV-2 RNA is generally detectable in upper respiratory specimens during the acute phase of infection. The lowest concentration of SARS-CoV-2 viral copies this assay can detect is 138 copies/mL. A negative result does not preclude SARS-Cov-2 infection and should not be used as the sole basis for treatment or other patient management decisions. A negative result may occur with  improper specimen collection/handling, submission of specimen other than nasopharyngeal swab, presence of viral mutation(s) within the areas targeted by this assay, and inadequate number of viral copies(<138 copies/mL). A negative result must be combined with clinical observations, patient history, and epidemiological information. The expected result is Negative.  Fact Sheet for Patients:  BloggerCourse.com  Fact Sheet for Healthcare Providers:  SeriousBroker.it  This test is no t yet approved or cleared by the [place] FDA and  has been authorized for detection and/or diagnosis of SARS-CoV-2 by FDA under an Emergency Use Authorization (EUA). This EUA will remain  in effect (meaning this test can be used) for the duration of the COVID-19 declaration under Section  564(b)(1) of the Act, 21 U.S.C.section 360bbb-3(b)(1), unless the authorization is terminated  or revoked sooner.       Influenza A by PCR NEGATIVE NEGATIVE Final   Influenza B by PCR NEGATIVE NEGATIVE Final    Comment: (NOTE) The Xpert Xpress SARS-CoV-2/FLU/RSV plus assay is intended as an aid in the diagnosis of influenza from Nasopharyngeal swab specimens and should not be used as a sole basis for treatment. Nasal washings and aspirates are unacceptable for Xpert Xpress SARS-CoV-2/FLU/RSV testing.  Fact Sheet for Patients: BloggerCourse.com  Fact Sheet for Healthcare Providers: SeriousBroker.it  This test is not yet approved or cleared by the [place] FDA and has been authorized for detection and/or diagnosis of SARS-CoV-2 by FDA under an Emergency Use Authorization (EUA). This EUA will remain in effect (meaning this test can be used) for the duration of the COVID-19 declaration under Section 564(b)(1) of the Act, 21 U.S.C. section 360bbb-3(b)(1), unless the authorization is terminated or revoked.  Performed at [facility], [address] [place], [place] [address]     RADIOLOGY STUDIES/RESULTS: CT HEAD WO CONTRAST ( )  Result Date: 01/03/2021 CLINICAL DATA:  Neuro deficit, acute, stroke suspected. 24 hr f/u after tPA. EXAM: CT HEAD WITHOUT CONTRAST TECHNIQUE: Contiguous axial images were obtained from the base of the skull through the vertex without intravenous contrast. COMPARISON:  Head CT and MRI 01/02/2021 FINDINGS: Brain: Hypodensity in the left middle frontal gyrus corresponds to the acute infarct on MRI. The small acute left parietal infarct on MRI is not well seen on CT. No intracranial hemorrhage, mass, midline shift, or extra-axial fluid collection is identified. The ventricles are normal. Vascular: Calcified atherosclerosis at the skull base. Skull: No fracture or suspicious osseous lesion.  Sinuses/Orbits: Visualized paranasal sinuses and mastoid air cells are clear. Visualized orbits are unremarkable. Other: None. IMPRESSION: 1. Acute left frontal infarct as seen on MRI. 2. No intracranial hemorrhage. Electronically Signed   By: Sebastian Ache M.D.   On: 01/03/2021 12:49   ECHOCARDIOGRAM COMPLETE BUBBLE STUDY  Result Date: 01/04/2021    ECHOCARDIOGRAM REPORT   Patient Name:   Amber [name] Date of Exam: 01/04/2021 Medical Rec #:  [id number]       Height:       62.0 in Accession #:    [id number]      Weight:  176.6 lb Date of Birth:  24-Aug-1970       BSA:          1.813 m Patient Age:    50 years        BP:           133/94 mmHg Patient Gender: F               HR:           78 bpm. Exam Location:  [facility] Procedure: 2D Echo, Color Doppler, Cardiac Doppler and Saline Contrast Bubble            Study Indications:     I63.9 Stroke  History:         Patient has prior history of Echocardiogram examinations. TIA;                  Risk Factors:Dyslipidemia. Asthma.  Sonographer:     Humphrey Rolls RDCS (AE) Referring Phys:  [id number] Rosalva Ferron GRAVES Diagnosing Phys: Debbe Odea MD  Sonographer Comments: No subcostal window. IMPRESSIONS  1. Left ventricular ejection fraction, by estimation, is 55 to 60%. The left ventricle has normal function. The left ventricle has no regional wall motion abnormalities. Left ventricular diastolic parameters are consistent with Grade I diastolic dysfunction (impaired relaxation).  2. Right ventricular systolic function is normal. The right ventricular size is normal.  3. The mitral valve is degenerative. Mild mitral valve regurgitation.  4. The aortic valve is tricuspid. Aortic valve regurgitation is not visualized.  5. Agitated saline contrast bubble study was negative, with no evidence of any interatrial shunt. FINDINGS  Left Ventricle: Left ventricular ejection fraction, by estimation, is 55 to 60%. The left ventricle has normal function. The left ventricle has no regional  wall motion abnormalities. The left ventricular internal cavity size was normal in size. There is  no left ventricular hypertrophy. Left ventricular diastolic parameters are consistent with Grade I diastolic dysfunction (impaired relaxation). Right Ventricle: The right ventricular size is normal. No increase in right ventricular wall thickness. Right ventricular systolic function is normal. Left Atrium: Left atrial size was normal in size. Right Atrium: Right atrial size was normal in size. Pericardium: There is no evidence of pericardial effusion. Mitral Valve: The mitral valve is degenerative in appearance. There is mild thickening of the mitral valve leaflet(s). Mild mitral valve regurgitation. MV peak gradient, 3.0 mmHg. The mean mitral valve gradient is 1.0 mmHg. Tricuspid Valve: The tricuspid valve is grossly normal. Tricuspid valve regurgitation is not demonstrated. Aortic Valve: The aortic valve is tricuspid. Aortic valve regurgitation is not visualized. Aortic valve mean gradient measures 2.0 mmHg. Aortic valve peak gradient measures 3.3 mmHg. Aortic valve area, by VTI measures 3.04 cm. Pulmonic Valve: The pulmonic valve was not well visualized. Pulmonic valve regurgitation is not visualized. Aorta: The aortic root is normal in size and structure. Venous: The inferior vena cava was not well visualized. IAS/Shunts: No atrial level shunt detected by color flow Doppler. Agitated saline contrast was given intravenously to evaluate for intracardiac shunting. Agitated saline contrast bubble study was negative, with no evidence of any interatrial shunt.  LEFT VENTRICLE PLAX 2D LVIDd:         4.90 cm  Diastology LVIDs:         3.50 cm  LV e' medial:    7.40 cm/s LV PW:         1.00 cm  LV E/e' medial:  9.0 LV IVS:  0.80 cm  LV e' lateral:   6.64 cm/s LVOT diam:     1.90 cm  LV E/e' lateral: 10.0 LV SV:         46 LV SV Index:   25 LVOT Area:     2.84 cm  RIGHT VENTRICLE RV Basal diam:  3.00 cm LEFT ATRIUM              Index       RIGHT ATRIUM          Index LA diam:        3.30 cm 1.82 cm/m  RA Area:     9.06 cm LA Vol (A2C):   33.8 ml 18.64 ml/m RA Volume:   18.90 ml 10.42 ml/m LA Vol (A4C):   16.9 ml 9.32 ml/m LA Biplane Vol: 25.0 ml 13.79 ml/m  AORTIC VALVE                   PULMONIC VALVE AV Area (Vmax):    2.70 cm    PV Vmax:       0.85 m/s AV Area (Vmean):   2.51 cm    PV Vmean:      59.400 cm/s AV Area (VTI):     3.04 cm    PV VTI:        0.161 m AV Vmax:           91.10 cm/s  PV Peak grad:  2.9 mmHg AV Vmean:          71.400 cm/s PV Mean grad:  2.0 mmHg AV VTI:            0.152 m AV Peak Grad:      3.3 mmHg AV Mean Grad:      2.0 mmHg LVOT Vmax:         86.80 cm/s LVOT Vmean:        63.100 cm/s LVOT VTI:          0.163 m LVOT/AV VTI ratio: 1.07  AORTA Ao Root diam: 3.20 cm MITRAL VALVE MV Area (PHT): 3.20 cm    SHUNTS MV Area VTI:   2.88 cm    Systemic VTI:  0.16 m MV Peak grad:  3.0 mmHg    Systemic Diam: 1.90 cm MV Mean grad:  1.0 mmHg MV Vmax:       0.86 m/s MV Vmean:      54.4 cm/s MV Decel Time: 237 msec MV E velocity: 66.40 cm/s MV A velocity: 79.30 cm/s MV E/A ratio:  0.84 Debbe Odea MD Electronically signed by Debbe Odea MD Signature Date/Time: 01/04/2021/11:28:09 AM    Final      LOS: 3 days   Jeoffrey Massed, MD  [facility]    To contact the attending provider between 7A-7P or the covering provider during after hours 7P-7A, please log into the web site [email or web address] and access using universal [place] password for that web site. If you do not have the password, please call the hospital operator.  01/05/2021, 11:17 AM
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
## 2021-01-06 ENCOUNTER — Encounter: Payer: Self-pay | Admitting: Anesthesiology

## 2021-01-06 ENCOUNTER — Inpatient Hospital Stay (HOSPITAL_COMMUNITY)
Admit: 2021-01-06 | Discharge: 2021-01-06 | Disposition: A | Discharge status: Home / Self Care | Payer: BC Managed Care – PPO | Attending: Physician Assistant | Admitting: Physician Assistant

## 2021-01-06 ENCOUNTER — Inpatient Hospital Stay: Payer: BC Managed Care – PPO

## 2021-01-06 ENCOUNTER — Encounter
Admission: EM | Disposition: A | Discharge status: Rehabilitation Facility | Payer: Self-pay | Source: Home / Self Care | Attending: Internal Medicine

## 2021-01-06 ENCOUNTER — Encounter: Payer: Self-pay | Admitting: Internal Medicine

## 2021-01-06 DIAGNOSIS — I6389 Other cerebral infarction: Secondary | ICD-10-CM

## 2021-01-06 DIAGNOSIS — I340 Nonrheumatic mitral (valve) insufficiency: Secondary | ICD-10-CM

## 2021-01-06 HISTORY — PX: TEE WITHOUT CARDIOVERSION: SHX5443

## 2021-01-06 LAB — GLUCOSE, CAPILLARY: Glucose-Capillary: 88 mg/dL (ref 70–99)

## 2021-01-06 SURGERY — ECHOCARDIOGRAM, TRANSESOPHAGEAL
Anesthesia: Moderate Sedation

## 2021-01-06 MED ORDER — BUTAMBEN-TETRACAINE-BENZOCAINE 2-2-14 % EX AERO
INHALATION_SPRAY | CUTANEOUS | Status: AC
  Filled 2021-01-06: qty 5

## 2021-01-06 MED ORDER — LIDOCAINE VISCOUS HCL 2 % MT SOLN
OROMUCOSAL | Status: AC
  Filled 2021-01-06: qty 15

## 2021-01-06 MED ORDER — MIDAZOLAM HCL 5 MG/5ML IJ SOLN
INTRAMUSCULAR | Status: AC
  Filled 2021-01-06: qty 5

## 2021-01-06 MED ORDER — MIDAZOLAM HCL 5 MG/5ML IJ SOLN
INTRAMUSCULAR | Status: AC | PRN
  Administered 2021-01-06: 2 mg via INTRAVENOUS
  Administered 2021-01-06 (×2): 1 mg via INTRAVENOUS

## 2021-01-06 MED ORDER — SODIUM CHLORIDE FLUSH 0.9 % IV SOLN
INTRAVENOUS | Status: AC
  Filled 2021-01-06: qty 10

## 2021-01-06 MED ORDER — FENTANYL CITRATE (PF) 100 MCG/2ML IJ SOLN
INTRAMUSCULAR | Status: AC
  Filled 2021-01-06: qty 2

## 2021-01-06 MED ORDER — FENTANYL CITRATE (PF) 100 MCG/2ML IJ SOLN
INTRAMUSCULAR | Status: AC | PRN
  Administered 2021-01-06 (×3): 25 ug via INTRAVENOUS

## 2021-01-06 MED ORDER — IOHEXOL 350 MG/ML SOLN
100.0000 mL | Freq: Once | INTRAVENOUS | Status: AC | PRN
  Administered 2021-01-06: 100 mL via INTRAVENOUS

## 2021-01-06 MED ORDER — SODIUM CHLORIDE 0.9 % IV SOLN: INTRAVENOUS | Status: DC

## 2021-01-06 NOTE — Progress Notes (Signed)
*  PRELIMINARY RESULTS* Echocardiogram Echocardiogram Transesophageal has been performed.  Amber [name] 01/06/2021, 9:56 AM

## 2021-01-06 NOTE — Interval H&P Note (Signed)
History and Physical Interval Note:  01/06/2021 9:15 AM  Amber [name]  has presented today for surgery, with the diagnosis of cryptogenic stroke.  The various methods of treatment have been discussed with the patient and family. After consideration of risks, benefits and other options for treatment, the patient has consented to  Procedure(s): TRANSESOPHAGEAL ECHOCARDIOGRAM (TEE) (N/A) as a surgical intervention.  The patient's history has been reviewed, patient examined, no change in status, stable for surgery.  I have reviewed the patient's chart and labs.  Questions were answered to the patient's satisfaction.     Amber [name]

## 2021-01-06 NOTE — Progress Notes (Signed)
PT Cancellation Note  Patient Details Name: Amber [name] MRN: [id number] DOB: [birth year]   Cancelled Treatment:     PT attempt. 2nd attempt this afternoon. Pt reports now with severe headache and states she hs not felt well all day since TEE. Author will return tomorrow and acute PT will advance as able per POC.    Rushie Chestnut 01/06/2021, 3:09 PM

## 2021-01-06 NOTE — Progress Notes (Signed)
Inpatient Rehabilitation Admissions Coordinator   I await insurance approval and medical workup completion to pursue admit to [facility] inpatient acute rehab/CIR at [facility] in [place].  Ottie Glazier, RN, MSN Rehab Admissions Coordinator [phone] 01/06/2021 10:58 AM

## 2021-01-06 NOTE — Progress Notes (Signed)
Inpatient Rehabilitation Admissions Coordinator   I have received insurance approval for CIR admit at [facility]. I spoke with patient and her Mom on the phone and they are aware and in agreement. I am hopeful for bed availability tomorrow, but will alert acute team and TOC in the morning after our 0930 huddle.  Ottie Glazier, RN, MSN Rehab Admissions Coordinator [phone] 01/06/2021 3:45 PM

## 2021-01-06 NOTE — Progress Notes (Signed)
TEE completed this AM, revealing no left atrium or left atrial appendage thrombus. Negative bubble study.   Stroke work up is completed.   Will need outpatient Neurology follow up with Dr. Pearlean Brownie.   No changes to recommendations documented in Dr. Sharen Hones note from 8/7.  Neurohospitalist service will sign off. Please call if there are additional questions.   Electronically signed: Dr. Caryl Pina
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
## 2021-01-06 NOTE — Progress Notes (Signed)
PROGRESS NOTE        PATIENT DETAILS Name: Amber [name] Age: 50 y.o. Sex: female Date of Birth: 05-01-1971 Admit Date: 01/02/2021 Admitting Physician Vida Rigger, MD [id number], Keane Scrape, NP  Brief Narrative: Patient is a 50 y.o. female with prior history of cryptogenic CVA (underwent extensive work-up in 2016) who presented with acute expressive aphasia-received tPA-monitored in the ICU-subsequently transferred to the [facility] service on 8/7.  See below for further details.    Significant events: 8/5>> admit-expressive aphasia-acute CVA-received tPA-sent to ICU 8/7>> transfer to [facility]  Significant studies: 8/5>> MRI brain: Acute infarct in the left middle frontal, left parietal areas. 8/5>> CT angio head/neck: No evidence of LVO or hemodynamically significant stenosis. 8/5>> A1c 6.2 8/5>> LDL: 86 8/9>> TEE: EF 55-60%-no thrombus-no evidence of intra-atrial shunt   Antimicrobial therapy: None  Microbiology data: None  Procedures : 8/9>> TEE  Consults: Neurology, PCCM  DVT Prophylaxis : enoxaparin (LOVENOX) injection 40 mg Start: 01/04/21 2200 SCDs Start: 01/02/21 1310   Subjective: Continues to have some speech abnormalities but otherwise appears stable.  Assessment/Plan: Acute CVA: Continues to have some residual expressive aphasia-but slowly improving-work-up as above-continue aspirin/Plavix x3 weeks followed by Plavix alone.  Neurology has sent a epic referral to Dr. Marlis Edelson clinic for outpatient follow-up.  Patient has had prior extensive work-up in 2016 for cryptogenic stroke.  Awaiting insurance authorization-as CIR planned on discharge.  History of bronchial asthma: Stable without any exacerbation  Morbid Obesity: Estimated body mass index is 32.86 kg/m as calculated from the following:   Height as of this encounter: 5\' 2"  (1.575 m).   Weight as of this encounter: 81.5 kg.     Diet: Diet Order             Diet Heart Room  service appropriate? Yes; Fluid consistency: Thin  Diet effective now                    Code Status: Full code   Family Communication: Mother at bedside   Disposition Plan: Status is: Inpatient  Remains inpatient appropriate because:Inpatient level of care appropriate due to severity of illness  Dispo: The patient is from: Home              Anticipated d/c is to: Home              Patient currently is not medically stable to d/c.   Difficult to place patient No    Barriers to Discharge: Awaiting CIR bed.  Antimicrobial agents: Anti-infectives (From admission, onward)    None        Time spent: 25-minutes-Greater than 50% of this time was spent in counseling, explanation of diagnosis, planning of further management, and coordination of care.  MEDICATIONS: Scheduled Meds:  vitamin C  1,000 mg Oral Daily   aspirin EC  81 mg Oral Daily   cholecalciferol  1,000 Units Oral Daily   clopidogrel  75 mg Oral Daily   enoxaparin (LOVENOX) injection  40 mg Subcutaneous Q24H   fentaNYL       lidocaine       midazolam       multivitamin with minerals  1 tablet Oral Daily   pantoprazole (PROTONIX) IV  40 mg Intravenous QHS   rosuvastatin  40 mg Oral Daily   sodium chloride flush  3 mL Intravenous Once  sodium chloride flush       Continuous Infusions:   PRN Meds:.acetaminophen, albuterol, aspirin-acetaminophen-caffeine, docusate sodium, ondansetron (ZOFRAN) IV, oxyCODONE, polyethylene glycol   PHYSICAL EXAM: Vital signs: Vitals:   01/06/21 0948 01/06/21 1000 01/06/21 1015 01/06/21 1141  BP: (!) 132/92 (!) 132/103 (!) 128/98 (!) 126/94  Pulse: (!) 102 95 85 78  Resp: (!) 21 20 16 15   Temp:    98.8 F (37.1 C)  TempSrc:      SpO2: 98% 93% 94% 100%  Weight:      Height:       Filed Weights   01/04/21 2014 01/05/21 0500 01/06/21 0500  Weight: 78.5 kg 81.2 kg 81.5 kg   Body mass index is 32.86 kg/m.   Gen Exam:Alert awake-not in any distress-continues  to have some amount of expressive aphasia. HEENT:atraumatic, normocephalic Chest: B/L clear to auscultation anteriorly CVS:S1S2 regular Abdomen:soft non tender, non distended Extremities:no edema Neurology: Non focal Skin: no rash   I have personally reviewed following labs and imaging studies  LABORATORY DATA: CBC: Recent Labs  Lab 01/02/21 1343 01/03/21 0446 01/04/21 0419  WBC 9.5 6.2 5.9  NEUTROABS 6.5  --   --   HGB 14.2 13.9 14.0  HCT 43.4 43.7 42.6  MCV 87.9 91.2 89.5  PLT 210 206 231     Basic Metabolic Panel: Recent Labs  Lab 01/02/21 1343 01/03/21 0446 01/04/21 0419  NA 141 143 142  K 4.0 3.5 3.5  CL 106 112* 108  CO2 26 27 28   GLUCOSE 98 109* 97  BUN 10 9 12   CREATININE 0.89 0.92 0.90  CALCIUM 9.9 9.0 8.9  MG  --  1.9 2.1  PHOS  --  4.1 4.1     GFR: Estimated Creatinine Clearance: 75.7 mL/min (by C-G formula based on SCr of 0.9 mg/dL).  Liver Function Tests: Recent Labs  Lab 01/02/21 1343  AST 21  ALT 28  ALKPHOS 74  BILITOT 0.7  PROT 7.5  ALBUMIN 3.7    No results for input(s): LIPASE, AMYLASE in the last 168 hours. No results for input(s): AMMONIA in the last 168 hours.  Coagulation Profile: Recent Labs  Lab 01/02/21 1343  INR 1.1     Cardiac Enzymes: No results for input(s): CKTOTAL, CKMB, CKMBINDEX, TROPONINI in the last 168 hours.  BNP (last 3 results) No results for input(s): PROBNP in the last 8760 hours.  Lipid Profile: No results for input(s): CHOL, HDL, LDLCALC, TRIG, CHOLHDL, LDLDIRECT in the last 72 hours.   Thyroid Function Tests: No results for input(s): TSH, T4TOTAL, FREET4, T3FREE, THYROIDAB in the last 72 hours.  Anemia Panel: No results for input(s): VITAMINB12, FOLATE, FERRITIN, TIBC, IRON, RETICCTPCT in the last 72 hours.  Urine analysis:    Component Value Date/Time   COLORURINE YELLOW 02/13/2017 1405   APPEARANCEUR Clear 11/22/2018 1430   LABSPEC 1.012 02/13/2017 1405   PHURINE 5.0 02/13/2017  1405   GLUCOSEU Negative 11/22/2018 1430   HGBUR NEGATIVE 02/13/2017 1405   BILIRUBINUR Negative 11/22/2018 1445   BILIRUBINUR Negative 11/22/2018 1430   KETONESUR NEGATIVE 02/13/2017 1405   PROTEINUR Negative 11/22/2018 1445   PROTEINUR Negative 11/22/2018 1430   PROTEINUR NEGATIVE 02/13/2017 1405   UROBILINOGEN 0.2 11/22/2018 1445   UROBILINOGEN 0.2 03/14/2015 0138   NITRITE Negative 11/22/2018 1445   NITRITE Negative 11/22/2018 1430   NITRITE NEGATIVE 02/13/2017 1405   LEUKOCYTESUR Negative 11/22/2018 1445   LEUKOCYTESUR Negative 11/22/2018 1430    Sepsis Labs: Lactic Acid, Venous  No results found for: LATICACIDVEN  MICROBIOLOGY: Recent Results (from the past 240 hour(s))  Resp Panel by RT-PCR (Flu A&B, Covid) Nasopharyngeal Swab     Status: None   Collection Time: 01/02/21  5:15 PM   Specimen: Nasopharyngeal Swab; Nasopharyngeal(NP) swabs in vial transport medium  Result Value Ref Range Status   SARS Coronavirus 2 by RT PCR NEGATIVE NEGATIVE Final    Comment: (NOTE) SARS-CoV-2 target nucleic acids are NOT DETECTED.  The SARS-CoV-2 RNA is generally detectable in upper respiratory specimens during the acute phase of infection. The lowest concentration of SARS-CoV-2 viral copies this assay can detect is 138 copies/mL. A negative result does not preclude SARS-Cov-2 infection and should not be used as the sole basis for treatment or other patient management decisions. A negative result may occur with  improper specimen collection/handling, submission of specimen other than nasopharyngeal swab, presence of viral mutation(s) within the areas targeted by this assay, and inadequate number of viral copies(<138 copies/mL). A negative result must be combined with clinical observations, patient history, and epidemiological information. The expected result is Negative.  Fact Sheet for Patients:  BloggerCourse.com  Fact Sheet for Healthcare Providers:   SeriousBroker.it  This test is no t yet approved or cleared by the [place] FDA and  has been authorized for detection and/or diagnosis of SARS-CoV-2 by FDA under an Emergency Use Authorization (EUA). This EUA will remain  in effect (meaning this test can be used) for the duration of the COVID-19 declaration under Section 564(b)(1) of the Act, 21 U.S.C.section 360bbb-3(b)(1), unless the authorization is terminated  or revoked sooner.       Influenza A by PCR NEGATIVE NEGATIVE Final   Influenza B by PCR NEGATIVE NEGATIVE Final    Comment: (NOTE) The Xpert Xpress SARS-CoV-2/FLU/RSV plus assay is intended as an aid in the diagnosis of influenza from Nasopharyngeal swab specimens and should not be used as a sole basis for treatment. Nasal washings and aspirates are unacceptable for Xpert Xpress SARS-CoV-2/FLU/RSV testing.  Fact Sheet for Patients: BloggerCourse.com  Fact Sheet for Healthcare Providers: SeriousBroker.it  This test is not yet approved or cleared by the [place] FDA and has been authorized for detection and/or diagnosis of SARS-CoV-2 by FDA under an Emergency Use Authorization (EUA). This EUA will remain in effect (meaning this test can be used) for the duration of the COVID-19 declaration under Section 564(b)(1) of the Act, 21 U.S.C. section 360bbb-3(b)(1), unless the authorization is terminated or revoked.  Performed at [facility], [address] [place] [place], [place] [address]     RADIOLOGY STUDIES/RESULTS: ECHO TEE  Result Date: 01/06/2021    TRANSESOPHOGEAL ECHO REPORT   Patient Name:   OASIS GOEHRING Date of Exam: 01/06/2021 Medical Rec #:  [id number]       Height:       62.0 in Accession #:    [id number]      Weight:       179.7 lb Date of Birth:  09-24-1970       BSA:          1.827 m Patient Age:    50 years        BP:           137/95 mmHg Patient Gender: F                HR:           110 bpm. Exam Location:  [facility] Procedure: Transesophageal Echo, Color Doppler, Cardiac Doppler  and Saline            Contrast Bubble Study Indications:     Stroke  History:         Patient has prior history of Echocardiogram examinations, most                  recent 01/04/2021. Risk Factors:Diabetes and Dyslipidemia.                  Asthma; history of recurrent strokes.  Sonographer:     Humphrey Rolls RDCS (AE) Referring Phys:  [id number] Sondra Barges Diagnosing Phys: [name] MD PROCEDURE: After discussion of the risks and benefits of a TEE, an informed consent was obtained from the patient. TEE procedure time was 26 minutes. The transesophogeal probe was passed without difficulty through the esophogus of the patient. Local oropharyngeal anesthetic was provided with viscous lidocaine. Sedation performed by performing physician. Image quality was adequate. The patient's vital signs; including heart rate, blood pressure, and oxygen saturation; remained stable throughout the procedure. The patient developed no complications during the procedure. IMPRESSIONS  1. Left ventricular ejection fraction, by estimation, is 55 to 60%. The left ventricle has normal function.  2. Right ventricular systolic function is normal. The right ventricular size is normal.  3. No left atrial/left atrial appendage thrombus was detected. The LAA emptying velocity was 76 cm/s.  4. The mitral valve is abnormal. Mild mitral valve regurgitation.  5. The aortic valve is tricuspid. Aortic valve regurgitation is not visualized.  6. There is a linear echodensity in and adjacent to the ascending aorta that most likely represents artifact. However, given cryptogenic strokes, further evaluation with CTA could be helpful to exclude aortic pathology.  7. Agitated saline contrast bubble study was negative, with no evidence of any interatrial shunt. FINDINGS  Left Ventricle: Left ventricular ejection fraction, by estimation, is 55 to 60%.  The left ventricle has normal function. Right Ventricle: The right ventricular size is normal. Right ventricular systolic function is normal. Left Atrium: Left atrial size was normal in size. No left atrial/left atrial appendage thrombus was detected. The LAA emptying velocity was 76 cm/s. Right Atrium: Right atrial size was normal in size. Pericardium: There is no evidence of pericardial effusion. Mitral Valve: The mitral valve is abnormal. There is mild thickening of the posterior and anterior mitral valve leaflet(s). Normal mobility of the mitral valve leaflets. Mild mitral valve regurgitation. Tricuspid Valve: The tricuspid valve is normal in structure. Tricuspid valve regurgitation is not demonstrated. Aortic Valve: The aortic valve is tricuspid. Aortic valve regurgitation is not visualized. Pulmonic Valve: The pulmonic valve was not well visualized. Pulmonic valve regurgitation is not visualized. Aorta: There is a linear echodensity in and adjacent to the ascending aorta that most likely represents artifact. However, given cryptogenic strokes, further evaluation with CTA could be helpful to exclude aortic pathology. The aortic root is normal in size and structure. IAS/Shunts: No atrial level shunt detected by color flow Doppler. Agitated saline contrast was given intravenously to evaluate for intracardiac shunting. Agitated saline contrast bubble study was negative, with no evidence of any interatrial shunt.   AORTA Ao Root diam: 3.30 cm [name] MD Electronically signed by [name] MD Signature Date/Time: 01/06/2021/1:31:49 PM    Final      LOS: 4 days   Jeoffrey Massed, MD  [facility]    To contact the attending provider between 7A-7P or the covering provider during after hours 7P-7A, please log into the  web site [email or web address] and access using universal [place] password for that web site. If you do not have the password, please call the hospital operator.  01/06/2021, 2:12  PM

## 2021-01-06 NOTE — Progress Notes (Signed)
PT Cancellation Note  Patient Details Name: Amber [name] MRN: [id number] DOB: [birth year]   Cancelled Treatment:    Reason Eval/Treat Not Completed: Patient at procedure or test/unavailable.  Pt currently off floor for procedure.  Will re-attempt PT session at a later date/time.  Hendricks Limes, PT 01/06/21, 9:33 AM

## 2021-01-06 NOTE — CV Procedure (Signed)
    Transesophageal Echocardiogram Note  Amber [name] [id number] 04-Jun-1970  Procedure: Transesophageal Echocardiogram Indications: Cryptogenic stroke  Procedure Details Consent: Obtained Time Out: Verified patient identification, verified procedure, site/side was marked, verified correct patient position, special equipment/implants available, Radiology Safety Procedures followed,  medications/allergies/relevent history reviewed, required imaging and test results available.  Performed  Medications:  During this procedure the patient is administered a total of Versed 4 mg and Fentanyl 75 mcg  to achieve and maintain moderate conscious sedation.  The patient's heart rate, blood pressure, and oxygen saturation are monitored continuously during the procedure. The period of conscious sedation is 26 minutes, of which I was present face-to-face 100% of this time.  Left Ventrical:  Normal  Mitral Valve: Mildly thickened with calcification of P2.  Mild MR.  Aortic Valve: Tricuspid.  No significant regurgitation.  Tricuspid Valve: Normal.  Pulmonic Valve: Normal.  Left Atrium/ Left atrial appendage: No thrombus.  Atrial septum: Intact by color Doppler.  Negative bubble study.  Aorta: Echogenicity in ascending aorta, most likely artifact.  Consider CTA chest for further evaluation as clinically indicated.   Complications: No apparent complications Patient did tolerate procedure well.   [name], MD 01/06/2021, 10:09 AM

## 2021-01-06 NOTE — Progress Notes (Signed)
OT Cancellation Note  Patient Details Name: Amber [name] MRN: [id number] DOB: 06-06-1970   Cancelled Treatment:    Reason Eval/Treat Not Completed: Fatigue/lethargy limiting ability to participate. Upon attempt, pt sleeping, family in room. Pt wakes to therapist's voice, however endorses still feeling very sleepy/groggy from medications given for TEE earlier today. Pt agreeable and eager for OT re-attempt next date.   Wynona Canes, MPH, MS, OTR/L ascom [phone] 01/06/21, 2:13 PM

## 2021-01-07 ENCOUNTER — Encounter (HOSPITAL_COMMUNITY): Payer: Self-pay | Admitting: Physical Medicine & Rehabilitation

## 2021-01-07 ENCOUNTER — Telehealth: Payer: Self-pay | Admitting: Primary Care

## 2021-01-07 ENCOUNTER — Inpatient Hospital Stay (HOSPITAL_COMMUNITY)
Admission: RE | Admit: 2021-01-07 | Discharge: 2021-01-15 | DRG: 057 | Disposition: A | Discharge status: Home Health | Payer: BC Managed Care – PPO | Source: Intra-hospital | Attending: Physical Medicine & Rehabilitation | Admitting: Physical Medicine & Rehabilitation

## 2021-01-07 ENCOUNTER — Other Ambulatory Visit: Payer: Self-pay

## 2021-01-07 DIAGNOSIS — G441 Vascular headache, not elsewhere classified: Secondary | ICD-10-CM | POA: Diagnosis not present

## 2021-01-07 DIAGNOSIS — Z8042 Family history of malignant neoplasm of prostate: Secondary | ICD-10-CM | POA: Diagnosis not present

## 2021-01-07 DIAGNOSIS — E1159 Type 2 diabetes mellitus with other circulatory complications: Secondary | ICD-10-CM | POA: Diagnosis not present

## 2021-01-07 DIAGNOSIS — I69351 Hemiplegia and hemiparesis following cerebral infarction affecting right dominant side: Secondary | ICD-10-CM | POA: Diagnosis present

## 2021-01-07 DIAGNOSIS — Z7982 Long term (current) use of aspirin: Secondary | ICD-10-CM

## 2021-01-07 DIAGNOSIS — Z7984 Long term (current) use of oral hypoglycemic drugs: Secondary | ICD-10-CM | POA: Diagnosis not present

## 2021-01-07 DIAGNOSIS — Z8261 Family history of arthritis: Secondary | ICD-10-CM | POA: Diagnosis not present

## 2021-01-07 DIAGNOSIS — Z881 Allergy status to other antibiotic agents status: Secondary | ICD-10-CM

## 2021-01-07 DIAGNOSIS — Z79899 Other long term (current) drug therapy: Secondary | ICD-10-CM | POA: Diagnosis not present

## 2021-01-07 DIAGNOSIS — H539 Unspecified visual disturbance: Secondary | ICD-10-CM | POA: Diagnosis present

## 2021-01-07 DIAGNOSIS — I69320 Aphasia following cerebral infarction: Secondary | ICD-10-CM | POA: Diagnosis not present

## 2021-01-07 DIAGNOSIS — Z8249 Family history of ischemic heart disease and other diseases of the circulatory system: Secondary | ICD-10-CM | POA: Diagnosis not present

## 2021-01-07 DIAGNOSIS — J45909 Unspecified asthma, uncomplicated: Secondary | ICD-10-CM | POA: Diagnosis present

## 2021-01-07 DIAGNOSIS — E1165 Type 2 diabetes mellitus with hyperglycemia: Secondary | ICD-10-CM

## 2021-01-07 DIAGNOSIS — Z833 Family history of diabetes mellitus: Secondary | ICD-10-CM | POA: Diagnosis not present

## 2021-01-07 DIAGNOSIS — Z823 Family history of stroke: Secondary | ICD-10-CM

## 2021-01-07 DIAGNOSIS — R059 Cough, unspecified: Secondary | ICD-10-CM

## 2021-01-07 DIAGNOSIS — E119 Type 2 diabetes mellitus without complications: Secondary | ICD-10-CM

## 2021-01-07 DIAGNOSIS — I69392 Facial weakness following cerebral infarction: Secondary | ICD-10-CM

## 2021-01-07 DIAGNOSIS — I69319 Unspecified symptoms and signs involving cognitive functions following cerebral infarction: Secondary | ICD-10-CM

## 2021-01-07 DIAGNOSIS — R252 Cramp and spasm: Secondary | ICD-10-CM | POA: Diagnosis not present

## 2021-01-07 DIAGNOSIS — M79606 Pain in leg, unspecified: Secondary | ICD-10-CM | POA: Diagnosis not present

## 2021-01-07 DIAGNOSIS — I639 Cerebral infarction, unspecified: Secondary | ICD-10-CM

## 2021-01-07 DIAGNOSIS — E118 Type 2 diabetes mellitus with unspecified complications: Secondary | ICD-10-CM

## 2021-01-07 DIAGNOSIS — R4701 Aphasia: Secondary | ICD-10-CM | POA: Diagnosis not present

## 2021-01-07 DIAGNOSIS — Z83438 Family history of other disorder of lipoprotein metabolism and other lipidemia: Secondary | ICD-10-CM

## 2021-01-07 DIAGNOSIS — I69322 Dysarthria following cerebral infarction: Secondary | ICD-10-CM | POA: Diagnosis not present

## 2021-01-07 DIAGNOSIS — E785 Hyperlipidemia, unspecified: Secondary | ICD-10-CM | POA: Diagnosis present

## 2021-01-07 DIAGNOSIS — I6300 Cerebral infarction due to thrombosis of unspecified precerebral artery: Secondary | ICD-10-CM

## 2021-01-07 DIAGNOSIS — I69398 Other sequelae of cerebral infarction: Secondary | ICD-10-CM | POA: Diagnosis not present

## 2021-01-07 DIAGNOSIS — Z8673 Personal history of transient ischemic attack (TIA), and cerebral infarction without residual deficits: Secondary | ICD-10-CM | POA: Diagnosis present

## 2021-01-07 HISTORY — DX: Cerebral infarction, unspecified: I63.9

## 2021-01-07 LAB — CBC
HCT: 47.3 % — ABNORMAL HIGH (ref 36.0–46.0)
Hemoglobin: 15.4 g/dL — ABNORMAL HIGH (ref 12.0–15.0)
MCH: 28.7 pg (ref 26.0–34.0)
MCHC: 32.6 g/dL (ref 30.0–36.0)
MCV: 88.2 fL (ref 80.0–100.0)
Platelets: 290 10*3/uL (ref 150–400)
RBC: 5.36 MIL/uL — ABNORMAL HIGH (ref 3.87–5.11)
RDW: 14.3 % (ref 11.5–15.5)
WBC: 6.8 10*3/uL (ref 4.0–10.5)
nRBC: 0 % (ref 0.0–0.2)

## 2021-01-07 LAB — CARDIOLIPIN ANTIBODIES, IGG, IGM, IGA
Anticardiolipin IgA: <9 APL U/mL (ref 0–11)
Anticardiolipin IgG: <9 GPL U/mL (ref 0–14)
Anticardiolipin IgM: 18 MPL U/mL — ABNORMAL HIGH (ref 0–12)

## 2021-01-07 MED ORDER — PANTOPRAZOLE SODIUM 40 MG PO TBEC
40.0000 mg | DELAYED_RELEASE_TABLET | Freq: Every day | ORAL | Status: DC
  Administered 2021-01-07 – 2021-01-14 (×8): 40 mg via ORAL
  Filled 2021-01-07 (×8): qty 1

## 2021-01-07 MED ORDER — ASCORBIC ACID 500 MG PO TABS
1000.0000 mg | ORAL_TABLET | Freq: Every day | ORAL | Status: DC
  Administered 2021-01-08 – 2021-01-15 (×8): 1000 mg via ORAL
  Filled 2021-01-07 (×8): qty 2

## 2021-01-07 MED ORDER — ASPIRIN EC 81 MG PO TBEC
81.0000 mg | DELAYED_RELEASE_TABLET | Freq: Every day | ORAL | Status: DC
  Administered 2021-01-08 – 2021-01-15 (×8): 81 mg via ORAL
  Filled 2021-01-07 (×8): qty 1

## 2021-01-07 MED ORDER — BISACODYL 10 MG RE SUPP
10.0000 mg | Freq: Every day | RECTAL | Status: DC | PRN
  Filled 2021-01-07: qty 1

## 2021-01-07 MED ORDER — MELATONIN 5 MG PO TABS
5.0000 mg | ORAL_TABLET | Freq: Every evening | ORAL | Status: DC | PRN
  Administered 2021-01-08 – 2021-01-09 (×2): 5 mg via ORAL
  Filled 2021-01-07 (×3): qty 1

## 2021-01-07 MED ORDER — TOPIRAMATE 25 MG PO TABS
25.0000 mg | ORAL_TABLET | Freq: Every day | ORAL | Status: DC
  Administered 2021-01-07 – 2021-01-14 (×8): 25 mg via ORAL
  Filled 2021-01-07 (×9): qty 1

## 2021-01-07 MED ORDER — FLEET ENEMA 7-19 GM/118ML RE ENEM: 1.0000 | ENEMA | Freq: Once | RECTAL | Status: DC | PRN

## 2021-01-07 MED ORDER — ENOXAPARIN SODIUM 40 MG/0.4ML IJ SOSY
40.0000 mg | PREFILLED_SYRINGE | INTRAMUSCULAR | Status: DC
  Administered 2021-01-07 – 2021-01-13 (×7): 40 mg via SUBCUTANEOUS
  Filled 2021-01-07 (×7): qty 0.4

## 2021-01-07 MED ORDER — ADULT MULTIVITAMIN W/MINERALS CH
1.0000 | ORAL_TABLET | Freq: Every day | ORAL | Status: DC
  Administered 2021-01-08 – 2021-01-15 (×8): 1 via ORAL
  Filled 2021-01-07 (×8): qty 1

## 2021-01-07 MED ORDER — ALBUTEROL SULFATE (2.5 MG/3ML) 0.083% IN NEBU: 2.5000 mg | INHALATION_SOLUTION | RESPIRATORY_TRACT | Status: DC | PRN

## 2021-01-07 MED ORDER — OXYCODONE HCL 5 MG PO TABS: 5.0000 mg | ORAL_TABLET | Freq: Four times a day (QID) | ORAL | Status: DC | PRN

## 2021-01-07 MED ORDER — PROCHLORPERAZINE EDISYLATE 10 MG/2ML IJ SOLN: 5.0000 mg | Freq: Four times a day (QID) | INTRAMUSCULAR | Status: DC | PRN

## 2021-01-07 MED ORDER — DIPHENHYDRAMINE HCL 12.5 MG/5ML PO ELIX: 12.5000 mg | ORAL_SOLUTION | Freq: Four times a day (QID) | ORAL | Status: DC | PRN

## 2021-01-07 MED ORDER — DOCUSATE SODIUM 100 MG PO CAPS
100.0000 mg | ORAL_CAPSULE | Freq: Two times a day (BID) | ORAL | Status: DC | PRN
  Administered 2021-01-07: 100 mg via ORAL
  Filled 2021-01-07: qty 1

## 2021-01-07 MED ORDER — ALUM & MAG HYDROXIDE-SIMETH 200-200-20 MG/5ML PO SUSP: 30.0000 mL | ORAL | Status: DC | PRN

## 2021-01-07 MED ORDER — VITAMIN D 25 MCG (1000 UNIT) PO TABS
1000.0000 [IU] | ORAL_TABLET | Freq: Every day | ORAL | Status: DC
  Administered 2021-01-08 – 2021-01-15 (×8): 1000 [IU] via ORAL
  Filled 2021-01-07 (×8): qty 1

## 2021-01-07 MED ORDER — PROCHLORPERAZINE 25 MG RE SUPP: 12.5000 mg | Freq: Four times a day (QID) | RECTAL | Status: DC | PRN

## 2021-01-07 MED ORDER — TRAZODONE HCL 50 MG PO TABS: 25.0000 mg | ORAL_TABLET | Freq: Every evening | ORAL | Status: DC | PRN

## 2021-01-07 MED ORDER — CLOPIDOGREL BISULFATE 75 MG PO TABS: 75.0000 mg | ORAL_TABLET | Freq: Every day | ORAL | Status: DC

## 2021-01-07 MED ORDER — ROSUVASTATIN CALCIUM 20 MG PO TABS
40.0000 mg | ORAL_TABLET | Freq: Every day | ORAL | Status: DC
  Administered 2021-01-08 – 2021-01-15 (×8): 40 mg via ORAL
  Filled 2021-01-07 (×8): qty 2

## 2021-01-07 MED ORDER — METFORMIN HCL ER 500 MG PO TB24: 500.0000 mg | ORAL_TABLET | Freq: Every day | ORAL | 3 refills | Status: DC

## 2021-01-07 MED ORDER — POLYETHYLENE GLYCOL 3350 17 G PO PACK: 17.0000 g | PACK | Freq: Every day | ORAL | Status: DC | PRN

## 2021-01-07 MED ORDER — PANTOPRAZOLE SODIUM 40 MG IV SOLR: 40.0000 mg | Freq: Every day | INTRAVENOUS | Status: DC

## 2021-01-07 MED ORDER — PROCHLORPERAZINE MALEATE 5 MG PO TABS: 5.0000 mg | ORAL_TABLET | Freq: Four times a day (QID) | ORAL | Status: DC | PRN

## 2021-01-07 MED ORDER — GUAIFENESIN-DM 100-10 MG/5ML PO SYRP: 5.0000 mL | ORAL_SOLUTION | Freq: Four times a day (QID) | ORAL | Status: DC | PRN

## 2021-01-07 MED ORDER — ACETAMINOPHEN 325 MG PO TABS: 325.0000 mg | ORAL_TABLET | ORAL | Status: DC | PRN

## 2021-01-07 MED ORDER — CLOPIDOGREL BISULFATE 75 MG PO TABS
75.0000 mg | ORAL_TABLET | Freq: Every day | ORAL | Status: DC
  Administered 2021-01-08 – 2021-01-15 (×8): 75 mg via ORAL
  Filled 2021-01-07 (×8): qty 1

## 2021-01-07 MED ORDER — POLYETHYLENE GLYCOL 3350 17 G PO PACK
17.0000 g | PACK | Freq: Every day | ORAL | Status: DC | PRN
  Administered 2021-01-08 – 2021-01-10 (×2): 17 g via ORAL
  Filled 2021-01-07 (×3): qty 1
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
## 2021-01-07 NOTE — Progress Notes (Signed)
Inpatient Rehabilitation Medication Review by a Pharmacist  A complete drug regimen review was completed for this patient to identify any potential clinically significant medication issues.  Clinically significant medication issues were identified:  no  Check AMION for pharmacist assigned to patient if future medication questions/issues arise during this admission.  Pharmacist comments:   Time spent performing this drug regimen review (minutes):  15   Jemiah Ellenburg 01/07/2021 3:35 PM

## 2021-01-07 NOTE — Progress Notes (Signed)
Amber [name] to be D/C'd from [facility] and transferred to [facility] Inpatient rehab per MD order.  Discussed with the patient and all questions fully answered.  VSS, Skin clean, dry and intact without evidence of skin break down, no evidence of skin tears noted.  L AC IV catheter discontinued intact. Site without signs and symptoms of complications. Dressing and pressure applied. R AC IV left in and patient will be transferred to [facility] with line intact.   Report called to IPR nurse at [facility] that will be taking the patient. All questions fully answered.   Patient instructed to return to ED, call 911, or call MD for any changes in condition.   Patient to be escorted via [facility] transport, her mother will be taking her personal belongings and meeting her there at IPR.

## 2021-01-07 NOTE — Discharge Summary (Signed)
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 infarcts better seen on prior MRI. Code stroke imaging results were communicated on 01/02/2021 at 11:45 am to provider Dr. Quinn Axe via telephone, who verbally acknowledged these results. Electronically Signed   By: Margaretha Sheffield MD   On: 01/02/2021 11:54   CT ANGIO HEAD NECK W WO CM W PERF (CODE STROKE)  Result Date: 01/02/2021 EXAM: CT ANGIOGRAPHY HEAD AND NECK CT PERFUSION BRAIN TECHNIQUE: Multidetector CT imaging of the head and neck was performed using the standard protocol during bolus administration of intravenous contrast. Multiplanar CT image reconstructions and MIPs were obtained to evaluate the vascular anatomy. Carotid stenosis measurements (when applicable) are obtained utilizing NASCET criteria, using the distal internal carotid diameter as the denominator. Multiphase CT imaging of the brain was performed following IV bolus contrast injection. Subsequent parametric perfusion maps were calculated using RAPID software. CONTRAST:  148m OMNIPAQUE IOHEXOL 350 MG/ML SOLN COMPARISON:  None. March 14, 2015. FINDINGS: CTA NECK FINDINGS Aortic arch: Great vessel  origins are patent. Right carotid system: No evidence of dissection, stenosis (50% or greater) or occlusion. Left carotid system: No evidence of dissection, stenosis (50% or greater) or occlusion. Vertebral arteries: Codominant. No evidence of dissection, stenosis (50% or greater) or occlusion. Skeleton: No evidence of acute abnormality. Other neck: No acute abnormality. Upper chest: Visualized lung apices are clear. Review of the MIP images confirms the above findings CTA HEAD FINDINGS Anterior circulation: Intracranial ICAs are patent. Evaluation the cavernous ICAs is limited due to venous opacification of the adjacent cavernous sinuses without visible hemodynamically significant stenosis. Bilateral MCAs and ACAs are patent. No aneurysm identified. Posterior circulation: Bilateral intradural vertebral arteries, basilar artery, and posterior cerebral arteries are patent without proximal hemodynamically significant stenosis. No aneurysm identified. Venous sinuses: As permitted by contrast timing, patent. Review of the MIP images confirms the above findings CT Brain Perfusion Findings: ASPECTS: 0 CBF (<30%) Volume: 078mPerfusion (Tmax>6.0s) volume: 64m14mismatch Volume: 64mL79mfarction Location:None IMPRESSION: 1. No evidence of a large vessel occlusion or proximal hemodynamically significant stenosis in the head or neck. 2. No evidence of penumbra or core infarct on perfusion. Findings discussed with Dr. StacQuinn Axe telephone at 12:22 p.m. Electronically Signed   By: FredMargaretha Sheffield  On: 01/02/2021 12:43     TODAY-DAY OF DISCHARGE:  Subjective:   LataJennessy Sandridgeay has no headache,no chest abdominal pain,no new weakness tingling or numbness, feels much better wants to go home today.   Objective:   Blood pressure 123/88, pulse 70, temperature 97.9 F (36.6 C), resp. rate 15, height '5\' 2"'  (1.575 m), weight 81 kg, SpO2 98 %.  Intake/Output Summary (Last 24 hours) at 01/07/2021 1009 Last data filed at  01/06/2021 1300 Gross per 24 hour  Intake 0 ml  Output --  Net 0 ml   Filed Weights   01/05/21 0500 01/06/21 0500 01/07/21 0414  Weight: 81.2 kg 81.5 kg 81 kg    Exam: Awake Alert, Oriented *3, No new F.N deficits, Normal affect [place].AT,PERRAL Supple Neck,No JVD, No cervical lymphadenopathy appriciated.  Symmetrical Chest wall movement, Good air movement bilaterally, CTAB RRR,No Gallops,Rubs or new Murmurs, No Parasternal Heave +ve B.Sounds, Abd Soft, Non tender, No organomegaly appriciated, No rebound -guarding or rigidity. No Cyanosis, Clubbing or edema, No new Rash or bruise   PERTINENT RADIOLOGIC STUDIES: CT ANGIO CHEST AORTA W/CM &/OR WO/CM  Result Date: 01/06/2021 CLINICAL DATA:  Aortic abnormality on TEE, cryptogenic stroke EXAM: CT ANGIOGRAPHY CHEST WITH CONTRAST TECHNIQUE: Multidetector CT imaging of the chest was performed using the standard protocol during bolus administration of intravenous contrast. Multiplanar CT image reconstructions and MIPs were obtained to evaluate the vascular anatomy. CONTRAST:  176m OMNIPAQUE IOHEXOL 350 MG/ML SOLN COMPARISON:  None. FINDINGS: Cardiovascular: Evaluation of the aortic root and ascending aorta is limited by motion artifact on this non-cardiac gated CT angiography of the chest. No gross acute luminal abnormality is seen. Ascending thoracic aortic diameter is within normal limits. No acute luminal abnormality of the imaged aorta. No periaortic stranding or hemorrhage. Shared origin of the brachiocephalic and left common carotid arteries. Proximal great vessels are otherwise unremarkable. Central pulmonary arteries are normal caliber without large central or lobar filling defects on this non tailored examination of the pulmonary arteries. Normal heart size. No pericardial effusion. Mediastinum/Nodes: Fatty stippling in the anterior mediastinum, nonspecific though can reflect thymic remnant absence of traumatic findings other features to suggest a  more aggressive process. No mediastinal fluid or gas. Normal thyroid gland and thoracic inlet. No acute abnormality of the trachea or esophagus. No worrisome mediastinal, hilar or axillary adenopathy. Lungs/Pleura: Airways are patent. No consolidation, features of edema, pneumothorax, or effusion. No suspicious pulmonary nodules or masses. Upper Abdomen: 15 mm fluid attenuation cyst the anterior right lobe liver. No worrisome focal liver lesions. Smooth liver surface contour. Normal hepatic attenuation. Normal gallbladder and biliary tree. Musculoskeletal: No acute osseous abnormality or suspicious osseous lesion. No chest wall mass or suspicious bone lesions identified. Review of the MIP images confirms the above findings. IMPRESSION: Evaluation of the aortic root and ascending aorta is somewhat limited by cardiac pulsation/motion artifact on this nongated CT angiography of the chest. No gross luminal abnormality is seen accounting for this motion artifact. Normal size of the aortic root. No periaortic abnormalities. If there is persisting clinical concern, could repeat imaging with cardiac gating. 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 Electronically Signed   By: PLovena LeM.D.   On: 01/06/2021 19:36   ECHO TEE  Result Date: 01/06/2021    TRANSESOPHOGEAL ECHO REPORT   Patient Name:   LASHLEYMARIE GRANDERSONDate of Exam: 01/06/2021 Medical Rec #:  [id number]      Height:       62.0 in Accession #:    [id number]     Weight:       179.7 lb Date of Birth:  [birth year]      BSA:          1.827 m Patient Age:    89years        BP:           137/95  mmHg Patient Gender: F               HR:           110 bpm. Exam Location:  [facility] Procedure: Transesophageal Echo, Color Doppler, Cardiac Doppler and Saline            Contrast Bubble Study Indications:     Stroke  History:         Patient has prior history of Echocardiogram examinations,  most                  recent 01/04/2021. Risk Factors:Diabetes and Dyslipidemia.                  Asthma; history of recurrent strokes.  Sonographer:     Poca (AE) Referring Phys:  [id number] Rise Mu Diagnosing Phys: Nelva Bush MD PROCEDURE: After discussion of the risks and benefits of a TEE, an informed consent was obtained from the patient. TEE procedure time was 26 minutes. The transesophogeal probe was passed without difficulty through the esophogus of the patient. Local oropharyngeal anesthetic was provided with viscous lidocaine. Sedation performed by performing physician. Image quality was adequate. The patient's vital signs; including heart rate, blood pressure, and oxygen saturation; remained stable throughout the procedure. The patient developed no complications during the procedure. IMPRESSIONS  1. Left ventricular ejection fraction, by estimation, is 55 to 60%. The left ventricle has normal function.  2. Right ventricular systolic function is normal. The right ventricular size is normal.  3. No left atrial/left atrial appendage thrombus was detected. The LAA emptying velocity was 76 cm/s.  4. The mitral valve is abnormal. Mild mitral valve regurgitation.  5. The aortic valve is tricuspid. Aortic valve regurgitation is not visualized.  6. There is a linear echodensity in and adjacent to the ascending aorta that most likely represents artifact. However, given cryptogenic strokes, further evaluation with CTA could be helpful to exclude aortic pathology.  7. Agitated saline contrast bubble study was negative, with no evidence of any interatrial shunt. FINDINGS  Left Ventricle: Left ventricular ejection fraction, by estimation, is 55 to 60%. The left ventricle has normal function. Right Ventricle: The right ventricular size is normal. Right ventricular systolic function is normal. Left Atrium: Left atrial size was normal in size. No left atrial/left atrial appendage thrombus was detected. The  LAA emptying velocity was 76 cm/s. Right Atrium: Right atrial size was normal in size. Pericardium: There is no evidence of pericardial effusion. Mitral Valve: The mitral valve is abnormal. There is mild thickening of the posterior and anterior mitral valve leaflet(s). Normal mobility of the mitral valve leaflets. Mild mitral valve regurgitation. Tricuspid Valve: The tricuspid valve is normal in structure. Tricuspid valve regurgitation is not demonstrated. Aortic Valve: The aortic valve is tricuspid. Aortic valve regurgitation is not visualized. Pulmonic Valve: The pulmonic valve was not well visualized. Pulmonic valve regurgitation is not visualized. Aorta: There is a linear echodensity in and adjacent to the ascending aorta that most likely represents artifact. However, given cryptogenic strokes, further evaluation with CTA could be helpful to exclude aortic pathology. The aortic root is normal in size and structure. IAS/Shunts: No atrial level shunt detected by color flow Doppler. Agitated saline contrast was given intravenously to evaluate for intracardiac shunting. Agitated saline contrast bubble study was negative, with no evidence of any interatrial shunt.   AORTA Ao Root diam: 3.30 cm Nelva Bush MD Electronically signed by Nelva Bush MD Signature Date/Time: 01/06/2021/1:31:49  PM    Final      PERTINENT LAB RESULTS: CBC: Recent Labs    01/07/21 0447  WBC 6.8  HGB 15.4*  HCT 47.3*  PLT 290   CMET CMP     Component Value Date/Time   NA 142 01/04/2021 0419   NA 143 12/17/2019 1109   K 3.5 01/04/2021 0419   CL 108 01/04/2021 0419   CO2 28 01/04/2021 0419   GLUCOSE 97 01/04/2021 0419   BUN 12 01/04/2021 0419   BUN 12 12/17/2019 1109   CREATININE 0.90 01/04/2021 0419   CALCIUM 8.9 01/04/2021 0419   PROT 7.5 01/02/2021 1343   PROT 7.9 05/12/2016 0000   ALBUMIN 3.7 01/02/2021 1343   ALBUMIN 4.3 05/12/2016 0000   AST 21 01/02/2021 1343   ALT 28 01/02/2021 1343   ALKPHOS 74  01/02/2021 1343   BILITOT 0.7 01/02/2021 1343   BILITOT 0.3 05/12/2016 0000   GFRNONAA >60 01/04/2021 0419   GFRAA 90 12/17/2019 1109    GFR Estimated Creatinine Clearance: 75.4 mL/min (by C-G formula based on SCr of 0.9 mg/dL). No results for input(s): LIPASE, AMYLASE in the last 72 hours. No results for input(s): CKTOTAL, CKMB, CKMBINDEX, TROPONINI in the last 72 hours. Invalid input(s): POCBNP No results for input(s): DDIMER in the last 72 hours. No results for input(s): HGBA1C in the last 72 hours. No results for input(s): CHOL, HDL, LDLCALC, TRIG, CHOLHDL, LDLDIRECT in the last 72 hours. No results for input(s): TSH, T4TOTAL, T3FREE, THYROIDAB in the last 72 hours.  Invalid input(s): FREET3 No results for input(s): VITAMINB12, FOLATE, FERRITIN, TIBC, IRON, RETICCTPCT in the last 72 hours. Coags: No results for input(s): INR in the last 72 hours.  Invalid input(s): PT Microbiology: Recent Results (from the past 240 hour(s))  Resp Panel by RT-PCR (Flu A&B, Covid) Nasopharyngeal Swab     Status: None   Collection Time: 01/02/21  5:15 PM   Specimen: Nasopharyngeal Swab; Nasopharyngeal(NP) swabs in vial transport medium  Result Value Ref Range Status   SARS Coronavirus 2 by RT PCR NEGATIVE NEGATIVE Final    Comment: (NOTE) SARS-CoV-2 target nucleic acids are NOT DETECTED.  The SARS-CoV-2 RNA is generally detectable in upper respiratory specimens during the acute phase of infection. The lowest concentration of SARS-CoV-2 viral copies this assay can detect is 138 copies/mL. A negative result does not preclude SARS-Cov-2 infection and should not be used as the sole basis for treatment or other patient management decisions. A negative result may occur with  improper specimen collection/handling, submission of specimen other than nasopharyngeal swab, presence of viral mutation(s) within the areas targeted by this assay, and inadequate number of viral copies(<138 copies/mL). A  negative result must be combined with clinical observations, patient history, and epidemiological information. The expected result is Negative.  Fact Sheet for Patients:  EntrepreneurPulse.com.au  Fact Sheet for Healthcare Providers:  IncredibleEmployment.be  This test is no t yet approved or cleared by the [place] FDA and  has been authorized for detection and/or diagnosis of SARS-CoV-2 by FDA under an Emergency Use Authorization (EUA). This EUA will remain  in effect (meaning this test can be used) for the duration of the COVID-19 declaration under Section 564(b)(1) of the Act, 21 U.S.C.section 360bbb-3(b)(1), unless the authorization is terminated  or revoked sooner.       Influenza A by PCR NEGATIVE NEGATIVE Final   Influenza B by PCR NEGATIVE NEGATIVE Final    Comment: (NOTE) The Xpert Xpress SARS-CoV-2/FLU/RSV plus assay  is intended as an aid in the diagnosis of influenza from Nasopharyngeal swab specimens and should not be used as a sole basis for treatment. Nasal washings and aspirates are unacceptable for Xpert Xpress SARS-CoV-2/FLU/RSV testing.  Fact Sheet for Patients: EntrepreneurPulse.com.au  Fact Sheet for Healthcare Providers: IncredibleEmployment.be  This test is not yet approved or cleared by the [place] FDA and has been authorized for detection and/or diagnosis of SARS-CoV-2 by FDA under an Emergency Use Authorization (EUA). This EUA will remain in effect (meaning this test can be used) for the duration of the COVID-19 declaration under Section 564(b)(1) of the Act, 21 U.S.C. section 360bbb-3(b)(1), unless the authorization is terminated or revoked.  Performed at [facility], [address] [place], [place] [address]     FURTHER DISCHARGE INSTRUCTIONS:  Get Medicines reviewed and adjusted: Please take all your medications with you for your next visit with  your Primary MD  Laboratory/radiological data: Please request your Primary MD to go over all hospital tests and procedure/radiological results at the follow up, please ask your Primary MD to get all Hospital records sent to his/her office.  In some cases, they will be blood work, cultures and biopsy results pending at the time of your discharge. Please request that your primary care M.D. goes through all the records of your hospital data and follows up on these results.  Also Note the following: If you experience worsening of your admission symptoms, develop shortness of breath, life threatening emergency, suicidal or homicidal thoughts you must seek medical attention immediately by calling 911 or calling your MD immediately  if symptoms less severe.  You must read complete instructions/literature along with all the possible adverse reactions/side effects for all the Medicines you take and that have been prescribed to you. Take any new Medicines after you have completely understood and accpet all the possible adverse reactions/side effects.   Do not drive when taking Pain medications or sleeping medications (Benzodaizepines)  Do not take more than prescribed Pain, Sleep and Anxiety Medications. It is not advisable to combine anxiety,sleep and pain medications without talking with your primary care practitioner  Special Instructions: If you have smoked or chewed Tobacco  in the last 2 yrs please stop smoking, stop any regular Alcohol  and or any Recreational drug use.  Wear Seat belts while driving.  Please note: You were cared for by a hospitalist during your hospital stay. Once you are discharged, your primary care physician will handle any further medical issues. Please note that NO REFILLS for any discharge medications will be authorized once you are discharged, as it is imperative that you return to your primary care physician (or establish a relationship with a primary care physician if you  do not have one) for your post hospital discharge needs so that they can reassess your need for medications and monitor your lab values.  Total Time spent coordinating discharge including counseling, education and face to face time equals 35 minutes.  SignedOren Binet 01/07/2021 10:09 AM

## 2021-01-07 NOTE — Progress Notes (Signed)
Occupational Therapy Treatment Patient Details Name: Amber [name] MRN: [id number] DOB: Feb 15, 1971 Today's Date: 01/07/2021    History of present illness Per MD notes, pt is a 50 year old woman who presented to the emergency department with acute onset expressive aphasia. Code stroke initiated 8/5 in the ED and transfered to ICU. PMH includes multiple remote cerebellar strokes, hyperlipidemia, anxiety, depression, asthma, tension headache. MD assessment includes acute CVA and is s/p tPA.   OT comments  Pt seen for OT treatment this date to f/u re: safety with ADLs/ADL mobility. OT engages pt in fxl mobility to/from restroom with SUPV with RW for b/l UE support. Pt requires SUPV with commode transfer and use of grab bar with verbal/tactile cues to locate. Pt requires SETUP/SUPV and cues for safety/modified technique to don underwear (threads in sitting with increased time d/t decreased [place] and stands with close SBA for clothing mgt over hips. OT ed with pt re: modified hemi dressing technique for upper body clothing. OT engages pt in below-listed [facility] exercise. Pt left in chair at end of session. All needs met and in reach. Pt progressing with [facility] tasks, but still requiring increased time and focus versus baseline. Encouragement and therapeutic listening offered. Will continue to follow.    Follow Up Recommendations  CIR    Equipment Recommendations  Other (comment) (defer)    Recommendations for Other Services      Precautions / Restrictions Precautions Precautions: Fall Restrictions Weight Bearing Restrictions: No       Mobility Bed Mobility               General bed mobility comments: sitting EOB with mother present, when OT presents    Transfers Overall transfer level: Needs assistance Equipment used: Rolling walker (2 wheeled) Transfers: Sit to/from Stand Sit to Stand: Supervision         General transfer comment: cues for sequence, pace, safety.    Balance  Overall balance assessment: Needs assistance Sitting-balance support: No upper extremity supported;Feet supported Sitting balance-Leahy Scale: Good Sitting balance - Comments: Supervision at EOB   Standing balance support: No upper extremity supported;Single extremity supported;During functional activity Standing balance-Leahy Scale: Fair Standing balance comment: Pt requires at least B UE support and SUPV to CGA to sustain balance with fxl mobility                           ADL either performed or assessed with clinical judgement   ADL Overall ADL's : Needs assistance/impaired     Grooming: Wash/dry hands;Supervision/safety;Standing Grooming Details (indicate cue type and reason): sink-side, in restroom, with RW for standing balance         Upper Body Dressing : Minimal assistance;Sitting Upper Body Dressing Details (indicate cue type and reason): MIN A to don gown to front side and ed re: hemi dressing technique Lower Body Dressing: Set up;Supervision/safety;Sit to/from stand Lower Body Dressing Details (indicate cue type and reason): to don underwear, SUPV for standing clothing mgt over hips, increased time d/t decreased [place]      Cognition Arousal/Alertness: Awake/alert Behavior During Therapy: WFL for tasks assessed/performed Overall Cognitive Status: Within Functional Limits for tasks assessed  General Comments: Still exhibits expressive aphasia but improving        Exercises Other Exercises Other Exercises: OT engages pt in L hand [place] exercise as follows: thumb opposition/reposition x10 per digit, and MCP extension from hand resting on table position x10 per digit with incresed time and attention for third and fourth digit   Shoulder Instructions       General Comments      Pertinent Vitals/ Pain       Pain Assessment: No/denies  pain  Home Living                                          Prior Functioning/Environment              Frequency  Min 3X/week        Progress Toward Goals  OT Goals(current goals can now be found in the care plan section)  Progress towards OT goals: Progressing toward goals  Acute Rehab OT Goals Patient Stated Goal: be able to do more for herself and make it to 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 OT Goal Formulation: With patient/family Time For Goal Achievement: 01/17/21 Potential to Achieve Goals: Good  Plan Discharge plan remains appropriate;Frequency remains appropriate    Co-evaluation                 AM-PAC OT "6 Clicks" Daily Activity     Outcome Measure   Help from another person eating meals?: None Help from another person taking care of personal grooming?: A Little Help from another person toileting, which includes using toliet, bedpan, or urinal?: A Little Help from another person bathing (including washing, rinsing, drying)?: A Little Help from another person to put on and taking off regular upper body clothing?: A Little Help from another person to put on and taking off regular lower body clothing?: A Little 6 Click Score: 19    End of Session Equipment Utilized During Treatment: Gait belt;Rolling walker  OT Visit Diagnosis: Unsteadiness on feet (R26.81);Other symptoms and signs involving the nervous system (R29.898);Hemiplegia and hemiparesis Hemiplegia - Right/Left: Left Hemiplegia - dominant/non-dominant: Non-Dominant Hemiplegia - caused by: Cerebral infarction   Activity Tolerance Patient tolerated treatment well   Patient Left with call bell/phone within reach;with family/visitor present;in chair   Nurse Communication Mobility status        Time: 5183-3582 OT Time Calculation (min): 45 min  Charges: OT General Charges $OT Visit: 1 Visit OT Treatments $Self  Care/Home Management : 8-22 mins $Therapeutic Activity: 23-37 mins  Gerrianne Scale, MS, OTR/L ascom [phone] 01/07/21, 10:59 AM

## 2021-01-07 NOTE — Discharge Instructions (Addendum)
Inpatient Rehab Discharge Instructions  Amberlin Utke Discharge date and time: No discharge date for patient encounter.   Activities/Precautions/ Functional Status: Activity: As tolerated Diet: Diabetic diet Wound Care: Routine skin checks Functional status:  ___ No restrictions     ___ Walk up steps independently ___ 24/7 supervision/assistance   ___ Walk up steps with assistance ___ Intermittent supervision/assistance  ___ Bathe/dress independently ___ Walk with walker     _x__ Bathe/dress with assistance ___ Walk Independently    ___ Shower independently ___ Walk with assistance    ___ Shower with assistance ___ No alcohol     ___ Return to work/school ________  COMMUNITY REFERRALS UPON DISCHARGE:    Outpatient: PT     OT    ST                 Agency:[facility] [phone]              Appointment Date/Time:*Please expect follow-up within 7-10 business days to schedule your appointment. If you have not received follow-up, be sure to contact the site directly*  Medical Equipment/Items Ordered:3in1 bedside commode, and single point cane                                                 Agency/Supplier:[facility] [employer] [id number]    Special Instructions: No driving smoking or alcohol  Continue aspirin 81 mg daily and Plavix 75 mg day until 01/23/2021 then Plavix alone   My questions have been answered and I understand these instructions. I will adhere to these goals and the provided educational materials after my discharge from the hospital.  Patient/Caregiver Signature _______________________________ Date __________  Clinician Signature _______________________________________ Date __________  Please bring this form and your medication list with you to all your follow-up doctor's appointments.  STROKE/TIA DISCHARGE INSTRUCTIONS SMOKING Cigarette smoking nearly doubles your risk of having a stroke & is the single most alterable risk factor  If you smoke  or have smoked in the last 12 months, you are advised to quit smoking for your health. Most of the excess cardiovascular risk related to smoking disappears within a year of stopping. Ask you doctor about anti-smoking medications [employer] Quit Line: 1-800-QUIT NOW Free Smoking Cessation Classes [phone]  CHOLESTEROL Know your levels; limit fat & cholesterol in your diet  Lipid Panel     Component Value Date/Time   CHOL 181 01/02/2021 1343   CHOL 184 08/28/2019 1537   TRIG 94 01/02/2021 1343   HDL 76 01/02/2021 1343   HDL 75 08/28/2019 1537   CHOLHDL 2.4 01/02/2021 1343   VLDL 19 01/02/2021 1343   LDLCALC 86 01/02/2021 1343   LDLCALC 85 08/28/2019 1537     Many patients benefit from treatment even if their cholesterol is at goal. Goal: Total Cholesterol (CHOL) less than 160 Goal:  Triglycerides (TRIG) less than 150 Goal:  HDL greater than 40 Goal:  LDL (LDLCALC) less than 100   BLOOD PRESSURE American Stroke Association blood pressure target is less that 120/80 mm/Hg  Your discharge blood pressure is:  BP: 114/89 Monitor your blood pressure Limit your salt and alcohol intake Many individuals will require more than one medication for high blood pressure  DIABETES (A1c is a blood sugar average for last 3 months) Goal HGBA1c is under 7% (HBGA1c is blood sugar average for last 3  months)  Diabetes: No known diagnosis of diabetes    Lab Results  Component Value Date   HGBA1C 6.2 (H) 01/02/2021    Your HGBA1c can be lowered with medications, healthy diet, and exercise. Check your blood sugar as directed by your physician Call your physician if you experience unexplained or low blood sugars.  PHYSICAL ACTIVITY/REHABILITATION Goal is 30 minutes at least 4 days per week  Activity: Increase activity slowly, Therapies: Physical Therapy: Home Health Return to work:  Activity decreases your risk of heart attack and stroke and makes your heart stronger.  It helps control your weight and  blood pressure; helps you relax and can improve your mood. Participate in a regular exercise program. Talk with your doctor about the best form of exercise for you (dancing, walking, swimming, cycling).  DIET/WEIGHT Goal is to maintain a healthy weight  Your discharge diet is:  Diet Order             Diet Heart Room service appropriate? Yes; Fluid consistency: Thin  Diet effective now                   liquids Your height is:    Your current weight is: Weight: 81.6 kg Your Body Mass Index (BMI) is:  BMI (Calculated): 32.89 Following the type of diet specifically designed for you will help prevent another stroke. Your goal weight range is:   Your goal Body Mass Index (BMI) is 19-24. Healthy food habits can help reduce 3 risk factors for stroke:  High cholesterol, hypertension, and excess weight.  RESOURCES Stroke/Support Group:  Call [phone]   STROKE EDUCATION PROVIDED/REVIEWED AND GIVEN TO PATIENT Stroke warning signs and symptoms How to activate emergency medical system (call 911). Medications prescribed at discharge. Need for follow-up after discharge. Personal risk factors for stroke. Pneumonia vaccine given: No Flu vaccine given: No My questions have been answered, the writing is legible, and I understand these instructions.  I will adhere to these goals & educational materials that have been provided to me after my discharge from the hospital.    ____________________________________________________________________________  Heart Healthy, Consistent Carbohydrate Nutrition Therapy   A heart-healthy and consistent carbohydrate diet is recommended to manage heart disease and diabetes. To follow a heart-healthy and consistent carbohydrate diet, Eat a balanced diet with whole grains, fruits and vegetables, and lean protein sources.  Choose heart-healthy unsaturated fats. Limit saturated fats, trans fats, and cholesterol intake. Eat more plant-based or vegetarian meals  using beans and soy foods for protein.  Eat whole, unprocessed foods to limit the amount of sodium (salt) you eat.  Choose a consistent amount of carbohydrate at each meal and snack. Limit refined carbohydrates especially sugar, sweets and sugar-sweetened beverages.  If you drink alcohol, do so in moderation: one serving per day (women) and two servings per day (men). o One serving is equivalent to 12 ounces beer, 5 ounces wine, or 1.5 ounces distilled spirits  Tips Tips for Choosing Heart-Healthy Fats Choose lean protein and low-fat dairy foods to reduce saturated fat intake. Saturated fat is usually found in animal-based protein and is associated with certain health risks. Saturated fat is the biggest contributor to raise low-density lipoprotein (LDL) cholesterol levels. Research shows that limiting saturated fat lowers unhealthy cholesterol levels. Eat no more than 7% of your total calories each day from saturated fat. Ask your RDN to help you determine how much saturated fat is right for you. There are many foods that do not contain large amounts  of saturated fats. Swapping these foods to replace foods high in saturated fats will help you limit the saturated fat you eat and improve your cholesterol levels. You can also try eating more plant-based or vegetarian meals. Instead of. Try:  Whole milk, cheese, yogurt, and ice cream 1% or skim milk, low-fat cheese, non-fat yogurt, and low-fat ice cream  Fatty, marbled beef and pork Lean beef, pork, or venison  Poultry with skin Poultry without skin  Butter, stick margarine Reduced-fat, whipped, or liquid spreads  Coconut oil, palm oil Liquid vegetable oils: corn, canola, olive, soybean and safflower oils   Avoid foods that contain trans fats. Trans fats increase levels of LDL-cholesterol. Hydrogenated fat in processed foods is the main source of trans fats in foods.  Trans fats can be found in stick margarine, shortening, processed sweets, baked  goods, some fried foods, and packaged foods made with hydrogenated oils. Avoid foods with "partially hydrogenated oil" on the ingredient list such as: cookies, pastries, baked goods, biscuits, crackers, microwave popcorn, and frozen dinners. Choose foods with heart healthy fats. Polyunsaturated and monounsaturated fat are unsaturated fats that may help lower your blood cholesterol level when used in place of saturated fat in your diet. Ask your RDN about taking a dietary supplement with plant sterols and stanols to help lower your cholesterol level. Research shows that substituting saturated fats with unsaturated fats is beneficial to cholesterol levels. Try these easy swaps: Instead of. Try:  Butter, stick margarine, or solid shortening Reduced-fat, whipped, or liquid spreads  Beef, pork, or poultry with skin Fish and seafood  Chips, crackers, snack foods Raw or unsalted nuts and seeds or nut butters Hummus with vegetables Avocado on toast  Coconut oil, palm oil Liquid vegetable oils: corn, canola, olive, soybean and safflower oils  Limit the amount of cholesterol you eat to less than 200 milligrams per day. Cholesterol is a substance carried through the bloodstream via lipoproteins, which are known as "transporters" of fat. Some body functions need cholesterol to work properly, but too much cholesterol in the bloodstream can damage arteries and build up blood vessel linings (which can lead to heart attack and stroke). You should eat less than 200 milligrams cholesterol per day. People respond differently to eating cholesterol. There is no test available right now that can figure out which people will respond more to dietary cholesterol and which will respond less. For individuals with high intake of dietary cholesterol, different types of increase (none, small, moderate, large) in LDL-cholesterol levels are all possible.  Food sources of cholesterol include egg yolks and organ meats such as liver,  gizzards. Limit egg yolks to two to four per week and avoid organ meats like liver and gizzards to control cholesterol intake. Tips for Choosing Heart-Healthy Carbohydrates Consume a consistent amount of carbohydrate It is important to eat foods with carbohydrates in moderation because they impact your blood glucose level. Carbohydrates can be found in many foods such as: Grains (breads, crackers, rice, pasta, and cereals)  Starchy Vegetables (potatoes, corn, and peas)  Beans and legumes  Milk, soy milk, and yogurt  Fruit and fruit juice  Sweets (cakes, cookies, ice cream, jam and jelly) Your RDN will help you set a goal for how many carbohydrate servings to eat at your meals and snacks. For many adults, eating 3 to 5 servings of carbohydrate foods at each meal and 1 or 2 carbohydrate servings for each snack works well.  Check your blood glucose level regularly. It can tell you  if you need to adjust when you eat carbohydrates. Choose foods rich in viscous (soluble) fiber Viscous, or soluble, is found in the walls of plant cells. Viscous fiber is found only in plant-based foods. Eating foods with fiber helps to lower your unhealthy cholesterol and keep your blood glucose in range  Rich sources of viscous fiber include vegetables (asparagus, Brussels sprouts, sweet potatoes, turnips) fruit (apricots, mangoes, oranges), legumes, and whole grains (barley, oats, and oat bran).  As you increase your fiber intake gradually, also increase the amount of water you drink. This will help prevent constipation.  If you have difficulty achieving this goal, ask your RDN about fiber laxatives. Choose fiber supplements made with viscous fibers such as psyllium seed husks or methylcellulose to help lower unhealthy cholesterol.  Limit refined carbohydrates  There are three types of carbohydrates: starches, sugar, and fiber. Some carbohydrates occur naturally in food, like the starches in rice or corn or the sugars in  fruits and milk. Refined carbohydrates--foods with high amounts of simple sugars--can raise triglyceride levels. High triglyceride levels are associated with coronary heart disease. Some examples of refined carbohydrate foods are table sugar, sweets, and beverages sweetened with added sugar. Tips for Reducing Sodium (Salt) Although sodium is important for your body to function, too much sodium can be harmful for people with high blood pressure. As sodium and fluid buildup in your tissues and bloodstream, your blood pressure increases. High blood pressure may cause damage to other organs and increase your risk for a stroke. Even if you take a pill for blood pressure or a water pill (diuretic) to remove fluid, it is still important to have less salt in your diet. Ask your doctor and RDN what amount of sodium is right for you. Avoid processed foods. Eat more fresh foods.  Fresh fruits and vegetables are naturally low in sodium, as well as frozen vegetables and fruits that have no added juices or sauces.  Fresh meats are lower in sodium than processed meats, such as bacon, sausage, and hotdogs. Read the nutrition label or ask your butcher to help you find a fresh meat that is low in sodium. Eat less salt--at the table and when cooking.  A single teaspoon of table salt has 2,300 mg of sodium.  Leave the salt out of recipes for pasta, casseroles, and soups.  Ask your RDN how to cook your favorite recipes without sodium Be a smart shopper.  Look for food packages that say "salt-free" or "sodium-free." These items contain less than 5 milligrams of sodium per serving.  "Very low-sodium" products contain less than 35 milligrams of sodium per serving.  "Low-sodium" products contain less than 140 milligrams of sodium per serving.  Beware for "Unsalted" or "No Added Salt" products. These items may still be high in sodium. Check the nutrition label. Add flavors to your food without adding sodium.  Try lemon  juice, lime juice, fruit juice or vinegar.  Dry or fresh herbs add flavor. Try basil, bay leaf, dill, rosemary, parsley, sage, dry mustard, nutmeg, thyme, and paprika.  Pepper, red pepper flakes, and cayenne pepper can add spice t your meals without adding sodium. Hot sauce contains sodium, but if you use just a drop or two, it will not add up to much.  Buy a sodium-free seasoning blend or make your own at home. Additional Lifestyle Tips Achieve and maintain a healthy weight. Talk with your RDN or your doctor about what is a healthy weight for you. Set goals to  reach and maintain that weight.  To lose weight, reduce your calorie intake along with increasing your physical activity. A weight loss of 10 to 15 pounds could reduce LDL-cholesterol by 5 milligrams per deciliter. Participate in physical activity. Talk with your health care team to find out what types of physical activity are best for you. Set a plan to get about 30 minutes of exercise on most days.  Foods Recommended Food Group Foods Recommended  Grains Whole grain breads and cereals, including whole wheat, barley, rye, buckwheat, corn, teff, quinoa, millet, amaranth, brown or wild rice, sorghum, and oats Pasta, especially whole wheat or other whole grain types  [employer] [place] [employer], quinoa or wild rice Whole grain crackers, bread, rolls, pitas Home-made bread with reduced-sodium baking soda  Protein Foods Lean cuts of beef and pork (loin, leg, round, extra lean hamburger)  Skinless Press photographer and other wild game Dried beans and peas Nuts and nut butters Meat alternatives made with soy or textured vegetable protein  Egg whites or egg substitute Cold cuts made with lean meat or soy protein  Dairy Nonfat (skim), low-fat, or 1%-fat milk  Nonfat or low-fat yogurt or cottage cheese Fat-free and low-fat cheese  Vegetables Fresh, frozen, or canned vegetables without added fat or salt   Fruits Fresh, frozen, canned, or dried fruit    Oils Unsaturated oils (corn, olive, peanut, soy, sunflower, canola)  Soft or liquid margarines and vegetable oil spreads  Salad dressings Seeds and nuts  Avocado   Foods Not Recommended Food Group Foods Not Recommended  Grains Breads or crackers topped with salt Cereals (hot or cold) with more than 300 mg sodium per serving Biscuits, cornbread, and other "quick" breads prepared with baking soda Bread crumbs or stuffing mix from a store High-fat bakery products, such as doughnuts, biscuits, croissants, danish pastries, pies, cookies Instant cooking foods to which you add hot water and stir--potatoes, noodles, rice, etc. Packaged starchy foods--seasoned noodle or rice dishes, stuffing mix, macaroni and cheese dinner Snacks made with partially hydrogenated oils, including chips, cheese puffs, snack mixes, regular crackers, butter-flavored popcorn  Protein Foods Higher-fat cuts of meats (ribs, t-bone steak, regular hamburger) Bacon, sausage, or hot dogs Cold cuts, such as salami or bologna, deli meats, cured meats, corned beef Organ meats (liver, brains, gizzards, sweetbreads) Poultry with skin Fried or smoked meat, poultry, and fish Whole eggs and egg yolks (more than 2-4 per week) Salted legumes, nuts, seeds, or nut/seed butters Meat alternatives with high levels of sodium (>300 mg per serving) or saturated fat (>5 g per serving)  Dairy Whole milk,?2% fat milk, buttermilk Whole milk yogurt or ice cream Cream Half-&-half Cream cheese Sour cream Cheese  Vegetables Canned or frozen vegetables with salt, fresh vegetables prepared with salt, butter, cheese, or cream sauce Fried vegetables Pickled vegetables such as olives, pickles, or sauerkraut  Fruits Fried fruits Fruits served with butter or cream  Oils Butter, stick margarine, shortening Partially hydrogenated oils or trans fats Tropical oils (coconut, palm, palm kernel oils)  Other Candy, sugar sweetened soft drinks and  desserts Salt, sea salt, garlic salt, and seasoning mixes containing salt Bouillon cubes Ketchup, barbecue sauce, Worcestershire sauce, soy sauce, teriyaki sauce Miso Salsa Pickles, olives, relish   Heart Healthy Consistent Carbohydrate Vegetarian (Lacto-Ovo) Sample 1-Day Menu  Breakfast 1 cup oatmeal, cooked (2 carbohydrate servings)   cup blueberries (1 carbohydrate serving)  11 almonds, without salt  1 cup 1% milk (1 carbohydrate serving)  1 cup coffee  Morning Snack  1 cup fat-free plain yogurt (1 carbohydrate serving)  Lunch 1 whole wheat bun (1 carbohydrate servings)  1 black bean burger (1 carbohydrate servings)  1 slice cheddar cheese, low sodium  2 slices tomatoes  2 leaves lettuce  1 teaspoon mustard  1 small pear (1 carbohydrate servings)  1 cup green tea, unsweetened  Afternoon Snack 1/3 cup trail mix with nuts, seeds, and raisins, without salt (1 carbohydrate servinga)  Evening Meal  cup meatless chicken  2/3 cup brown rice, cooked (2 carbohydrate servings)  1 cup broccoli, cooked (2/3 carbohydrate serving)   cup carrots, cooked (1/3 carbohydrate serving)  2 teaspoons olive oil  1 teaspoon balsamic vinegar  1 whole wheat dinner roll (1 carbohydrate serving)  1 teaspoon margarine, soft, tub  1 cup 1% milk (1 carbohydrate serving)  Evening Snack 1 extra small banana (1 carbohydrate serving)  1 tablespoon peanut butter   Heart Healthy Consistent Carbohydrate Vegan Sample 1-Day Menu  Breakfast 1 cup oatmeal, cooked (2 carbohydrate servings)   cup blueberries (1 carbohydrate serving)  11 almonds, without salt  1 cup soymilk fortified with calcium, vitamin B12, and vitamin D  1 cup coffee  Morning Snack 6 ounces soy yogurt (1 carbohydrate servings)  Lunch 1 whole wheat bun(1 carbohydrate servings)  1 black bean burger (1 carbohydrate serving)  2 slices tomatoes  2 leaves lettuce  1 teaspoon mustard  1 small pear (1 carbohydrate servings)  1 cup green  tea, unsweetened  Afternoon Snack 1/3 cup trail mix with nuts, seeds, and raisins, without salt (1 carbohydrate servings)  Evening Meal  cup meatless chicken  2/3 cup brown rice, cooked (2 carbohydrate servings)  1 cup broccoli, cooked (2/3 carbohydrate serving)   cup carrots, cooked (1/3 carbohydrate serving)  2 teaspoons olive oil  1 teaspoon balsamic vinegar  1 whole wheat dinner roll (1 carbohydrate serving)  1 teaspoon margarine, soft, tub  1 cup soymilk fortified with calcium, vitamin B12, and vitamin D  Evening Snack 1 extra small banana (1 carbohydrate serving)  1 tablespoon peanut butter    Heart Healthy Consistent Carbohydrate Sample 1-Day Menu  Breakfast 1 cup cooked oatmeal (2 carbohydrate servings)  3/4 cup blueberries (1 carbohydrate serving)  1 ounce almonds  1 cup skim milk (1 carbohydrate serving)  1 cup coffee  Morning Snack 1 cup sugar-free nonfat yogurt (1 carbohydrate serving)  Lunch 2 slices whole-wheat bread (2 carbohydrate servings)  2 ounces lean [place] breast  1 ounce low-fat Swiss cheese  1 teaspoon mustard  1 slice tomato  1 lettuce leaf  1 small pear (1 carbohydrate serving)  1 cup skim milk (1 carbohydrate serving)  Afternoon Snack 1 ounce trail mix with unsalted nuts, seeds, and raisins (1 carbohydrate serving)  Evening Meal 3 ounces salmon  2/3 cup cooked brown rice (2 carbohydrate servings)  1 teaspoon soft margarine  1 cup cooked broccoli with 1/2 cup cooked carrots (1 carbohydrate serving  Carrots, cooked, boiled, drained, without salt  1 cup lettuce  1 teaspoon olive oil with vinegar for dressing  1 small whole grain roll (1 carbohydrate serving)  1 teaspoon soft margarine  1 cup unsweetened tea  Evening Snack 1 extra-small banana (1 carbohydrate serving)  Copyright 2020  Academy of Nutrition [employer] Dietetics. All rights reserved.   Roslyn SmilingStephanie Craig, MS, RD, LDN Clinical Dietitian Office phone [phone]

## 2021-01-07 NOTE — Progress Notes (Signed)
Inpatient Rehabilitation Admissions Coordinator   I have Cir bed to admit patient to [facility] in [place]. She is to admit to [facility] [address] with Dr Maryla Morrow admitting today. I spoke with pt's Mom at bedside of patient and they are in agreement to admit . I have alerted acute team, Dr Jerral Ralph and TOC. I will make arrangements with [facility] to transport at 1130 schedule.   Ottie Glazier, RN, MSN Rehab Admissions Coordinator [phone] 01/07/2021 10:14 AM

## 2021-01-07 NOTE — TOC Progression Note (Signed)
Transition of Care [facility]) - Progression Note    Patient Details  Name: Amber [name] MRN: [id number] Date of Birth: [birth year]  [facility] CM/SW Contact  Allayne Butcher, RN Phone Number: 01/07/2021, 9:15 AM  Clinical Narrative:    [facility] has gotten insurance authorization and will have a bed for patient today.     Expected Discharge Plan: IP Rehab Facility Barriers to Discharge: Insurance Authorization  Expected Discharge Plan and Services Expected Discharge Plan: IP Rehab Facility     Post Acute Care Choice: IP Rehab                   DME Arranged: N/A DME Agency: NA       HH Arranged: NA HH Agency: NA         Social Determinants of Health (SDOH) Interventions    Readmission Risk Interventions No flowsheet data found.

## 2021-01-07 NOTE — Progress Notes (Signed)
INPATIENT REHABILITATION ADMISSION NOTE   Arrival Method: stretcher      Mental Orientation: A&O x4   Assessment: done   Skin: done   IV'S: Right AC   Pain: Headache.   Tubes and Drains: none   Safety Measures: reviewed with pt and family   Vital Signs: done   Height and Weight: done   Rehab Orientation: done   Family: at bedside    Notes: done  Marylu Lund, RN

## 2021-01-07 NOTE — Progress Notes (Signed)
Patient ID: Amber [name], female   DOB: 15-Feb-1971, 50 y.o.   MRN: [id number] Met with the patient and sister and mom to introduce self and the role of the nurse CM. Reviewed secondary stroke risk factors including HTN, HLD (LDL 86), and DM (A1C 6.2) and previous history of strokes. On DAPT x 3 weeks then Plavix solo per MD. Reviewed medications and dietary modifications recommended. Continue to follow along to discharge to address educational needs and collaborate with the SW for facilitate preparation for discharge. Margarito Liner, RN

## 2021-01-07 NOTE — TOC Transition Note (Signed)
Transition of Care [facility]) - CM/SW Discharge Note   Patient Details  Name: Amber [name] MRN: [id number] Date of Birth: 09/27/1970  Transition of Care [facility]) CM/SW Contact:  Allayne Butcher, RN Phone Number: 01/07/2021, 11:02 AM   Clinical Narrative:    [facility] has a bed and has insurance authorization  Patient is discharging over there today.  [facility] will provide transport.  Discharge packet completed.    Final next level of care: IP Rehab Facility Barriers to Discharge: Barriers Resolved   Patient Goals and CMS Choice Patient states their goals for this hospitalization and ongoing recovery are:: patient agrees to [facility] CMS Medicare.gov Compare Post Acute Care list provided to:: Patient Choice offered to / list presented to : Patient  Discharge Placement              Patient chooses bed at: Other - please specify in the comment section below: ([facility]) Patient to be transferred to facility by: [facility] Name of family member notified: Myrene Buddy (mother) Patient and family notified of of transfer: 01/07/21  Discharge Plan and Services     Post Acute Care Choice: IP Rehab          DME Arranged: N/A DME Agency: NA       HH Arranged: NA HH Agency: NA        Social Determinants of Health (SDOH) Interventions     Readmission Risk Interventions No flowsheet data found.

## 2021-01-07 NOTE — Telephone Encounter (Addendum)
Pt daughter came into office to drop off [employer] Form. Placed in provider box.
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
## 2021-01-07 NOTE — Telephone Encounter (Addendum)
Pt sister came into office wanting to know the status of the paperwork that was dropped off this morning. Pt sister said they need them ASAP so she is able to keep the pt money going. Pt sister wants to be called when the paper work is done [phone]

## 2021-01-08 DIAGNOSIS — I639 Cerebral infarction, unspecified: Secondary | ICD-10-CM

## 2021-01-08 LAB — COMPREHENSIVE METABOLIC PANEL
ALT: 35 U/L (ref 0–44)
AST: 21 U/L (ref 15–41)
Albumin: 3.5 g/dL (ref 3.5–5.0)
Alkaline Phosphatase: 70 U/L (ref 38–126)
Anion gap: 9 (ref 5–15)
BUN: 14 mg/dL (ref 6–20)
CO2: 26 mmol/L (ref 22–32)
Calcium: 9.7 mg/dL (ref 8.9–10.3)
Chloride: 103 mmol/L (ref 98–111)
Creatinine, Ser: 1.02 mg/dL — ABNORMAL HIGH (ref 0.44–1.00)
GFR, Estimated: >60 mL/min (ref >60)
Glucose, Bld: 114 mg/dL — ABNORMAL HIGH (ref 70–99)
Potassium: 3.9 mmol/L (ref 3.5–5.1)
Sodium: 138 mmol/L (ref 135–145)
Total Bilirubin: 0.3 mg/dL (ref 0.3–1.2)
Total Protein: 7 g/dL (ref 6.5–8.1)

## 2021-01-08 LAB — CBC WITH DIFFERENTIAL/PLATELET
Abs Immature Granulocytes: 0.01 10*3/uL (ref 0.00–0.07)
Basophils Absolute: 0 10*3/uL (ref 0.0–0.1)
Basophils Relative: 1 %
Eosinophils Absolute: 0.2 10*3/uL (ref 0.0–0.5)
Eosinophils Relative: 3 %
HCT: 46.4 % — ABNORMAL HIGH (ref 36.0–46.0)
Hemoglobin: 15.2 g/dL — ABNORMAL HIGH (ref 12.0–15.0)
Immature Granulocytes: 0 %
Lymphocytes Relative: 45 %
Lymphs Abs: 2.6 10*3/uL (ref 0.7–4.0)
MCH: 28.8 pg (ref 26.0–34.0)
MCHC: 32.8 g/dL (ref 30.0–36.0)
MCV: 88 fL (ref 80.0–100.0)
Monocytes Absolute: 0.4 10*3/uL (ref 0.1–1.0)
Monocytes Relative: 7 %
Neutro Abs: 2.5 10*3/uL (ref 1.7–7.7)
Neutrophils Relative %: 44 %
Platelets: 284 10*3/uL (ref 150–400)
RBC: 5.27 MIL/uL — ABNORMAL HIGH (ref 3.87–5.11)
RDW: 14.2 % (ref 11.5–15.5)
WBC: 5.6 10*3/uL (ref 4.0–10.5)
nRBC: 0 % (ref 0.0–0.2)

## 2021-01-08 NOTE — Progress Notes (Signed)
Inpatient Rehabilitation Care Coordinator Assessment and Plan Patient Details  Name: Amber [name] MRN: [id number] Date of Birth: 19-Apr-1971  Today's Date: 01/08/2021  Hospital Problems: Principal Problem:   Acute CVA (cerebrovascular accident) [facility]) Active Problems:   Stroke (cerebrum) [facility])  Past Medical History:  Past Medical History:  Diagnosis Date   Anxiety    self reported   Asthma    Depression    controlled   Diabetes mellitus without complication (HCC)    Hyperlipidemia    controlled with medication   IBS (irritable bowel syndrome)    Stroke (HCC) 03/13/2015   Tension headache 11/18/2020   Past Surgical History:  Past Surgical History:  Procedure Laterality Date   ABDOMINAL HYSTERECTOMY  10/2006   bowel reconstruction  10/2006   with hysterectomy   CESAREAN SECTION  2005   EP IMPLANTABLE DEVICE N/A 03/17/2015   Procedure: Loop Recorder Insertion;  Surgeon: Will Jorja Loa, MD;  [facility]: [facility] INVASIVE CV LAB;  Service: Cardiovascular;  Laterality: N/A;   TEE WITHOUT CARDIOVERSION N/A 03/17/2015   Procedure: TRANSESOPHAGEAL ECHOCARDIOGRAM (TEE);  Surgeon: Chilton Si, MD;  Location: [facility] ENDOSCOPY;  Service: Cardiovascular;  Laterality: N/A;   TEE WITHOUT CARDIOVERSION N/A 01/06/2021   Procedure: TRANSESOPHAGEAL ECHOCARDIOGRAM (TEE);  Surgeon: [name], MD;  Location: [facility] ORS;  Service: Cardiovascular;  Laterality: N/A;   TOE SURGERY     Left 2nd metatarsal   Social History:  reports that she has never smoked. She has never used smokeless tobacco. She reports current alcohol use of about 7.0 standard drinks per week. She reports current drug use. Drug: Marijuana.  Family / Support Systems Marital Status: Single Patient Roles: Parent, Other (Comment) (employee) Children: Jordyn-daughter [phone] Other Supports: Carney Bern [phone]  Yvonne-Mom [phone] Anticipated Caregiver: Mom, daughter and other family  members Ability/Limitations of Caregiver: Mom is retired and is coming here from [facility] to assist Caregiver Availability: 24/7 Family Dynamics: Close knit with family most are in [facility], but are willing to come and assist her. her 67 yo daughter is a Holiday representative in [employer] this year. She has friends and colleagues who are supportive.  Social History Preferred language: English Religion: None Cultural Background: No issues Education: Colleged educated Read: Yes Write: Yes Employment Status: Employed Name of Employer: Industrial/product designer Return to Work Plans: Hopes to return school started 8/8 Legal History/Current Legal Issues: No issues Guardian/Conservator: None-according to MD pt is capable of making her own decisions while here. Sister and Mom very involved will make sure they are aware if any decision needs to be made while here   Abuse/Neglect Abuse/Neglect Assessment Can Be Completed: Yes Physical Abuse: Denies Verbal Abuse: Denies Sexual Abuse: Denies Exploitation of patient/patient's resources: Denies Self-Neglect: Denies  Emotional Status Pt's affect, behavior and adjustment status: Pt is motivated to recover from this. She had a CVA in 2016 and had a loop recorder placed but feels it no longer works. She has always been independent and wants to get back to this level. She wants to do whatever she needs to do to prevent another stroke-two is enough Recent Psychosocial Issues: Past CVA in 2016 other health issues Psychiatric History: History of depression/anxiety takes medications for this and finds it helpful. Would benefit from seeing neuro-psych if here long enough to be seen. Pt is young and this is her second stroke. Substance Abuse History: Feels ETOH is social and can stop, smokes marijuana at times but feels can quit this also  Patient / Family Perceptions, Expectations & Goals Pt/Family  understanding of illness & functional limitations: Pt is able to explain this  stroke and the deficits she has a result. She hopes to recover like she did last time. She has spoken with the MD and feels has a good understanding of her treatment plan going forward. She is glad to be here and knows this is the best place for her. Premorbid pt/family roles/activities: Mom, daughter, sister, employee, friend, etc Anticipated changes in roles/activities/participation: resume Pt/family expectations/goals: Pt states: " I hope to be able to take care of myself when I leave here."  Sister states: " I know if anyone can do it she can, she is strong willed and a hard worker."  Building surveyor: None Premorbid Home Care/DME Agencies: None Transportation available at discharge: Family pt drove PTA Resource referrals recommended: Neuropsychology  Discharge Planning Living Arrangements: Children Support Systems: Children, Counselling psychologist, Other relatives, Friends/neighbors Type of Residence: Private residence Insurance Resources: Media planner (specify) Herbalist) Financial Screen Referred: No Living Expenses: Psychologist, sport and exercise Management: Patient Does the patient have any problems obtaining your medications?: No Home Management: Self family available to assist now Patient/Family Preliminary Plans: Return home with Mom comng from [facility] to be there to assist if needed. Her 68 yo daughter is there but will be going to HS at the end of the month. Will await therapy evaluations and work on discharge needs. Sister is working on getting forms complete for STD. Will assist if needed. Care Coordinator Anticipated Follow Up Needs: HH/OP  Clinical Impression Pleasant very motivated female who has been through this in 2016 and recovered them. She is hopeful she will do as well again. She questions if she needs another loop recorder. Her Mom, sister and other family members are involved and Mom will be coming to stay with her and her daughter at discharge. Will place on neuro-psych  list.  Lucy Chris 01/08/2021, 12:39 PM
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
## 2021-01-08 NOTE — Progress Notes (Signed)
PROGRESS NOTE     Subjective/Complaints: Pt asking if she needs loop recorder replaced, hers is from 2016 No pains  ROS- neg CP, SOB, N/V/D   Objective:    Imaging Results (Last 48 hours)  CT ANGIO CHEST AORTA W/CM &/OR WO/CM   Result Date: 01/06/2021 CLINICAL DATA:  Aortic abnormality on TEE, cryptogenic stroke EXAM: CT ANGIOGRAPHY CHEST WITH CONTRAST TECHNIQUE: Multidetector CT imaging of the chest was performed using the standard protocol during bolus administration of intravenous contrast. Multiplanar CT image reconstructions and MIPs were obtained to evaluate the vascular anatomy. CONTRAST:  OMNIPAQUE IOHEXOL 350 MG/ML SOLN COMPARISON:  None. FINDINGS: Cardiovascular: Evaluation of the aortic root and ascending aorta is limited by motion artifact on this non-cardiac gated CT angiography of the chest. No gross acute luminal abnormality is seen. Ascending thoracic aortic diameter is within normal limits. No acute luminal abnormality of the imaged aorta. No periaortic stranding or hemorrhage. Shared origin of the brachiocephalic and left common carotid arteries. Proximal great vessels are otherwise unremarkable. Central pulmonary arteries are normal caliber without large central or lobar filling defects on this non tailored examination of the pulmonary arteries. Normal heart size. No pericardial effusion. Mediastinum/Nodes: Fatty stippling in the anterior mediastinum, nonspecific though can reflect thymic remnant absence of traumatic findings other features to suggest a more aggressive process. No mediastinal fluid or gas. Normal thyroid gland and thoracic inlet. No acute abnormality of the trachea or esophagus. No worrisome mediastinal, hilar or axillary adenopathy. Lungs/Pleura: Airways are patent. No consolidation, features of edema, pneumothorax, or effusion. No suspicious pulmonary nodules or masses. Upper Abdomen: 15 mm fluid attenuation cyst the anterior right lobe liver. No worrisome  focal liver lesions. Smooth liver surface contour. Normal hepatic attenuation. Normal gallbladder and biliary tree. Musculoskeletal: No acute osseous abnormality or suspicious osseous lesion. No chest wall mass or suspicious bone lesions identified. Review of the MIP images confirms the above findings. IMPRESSION: Evaluation of the aortic root and ascending aorta is somewhat limited by cardiac pulsation/motion artifact on this nongated CT angiography of the chest. No gross luminal abnormality is seen accounting for this motion artifact. Normal size of the aortic root. No periaortic abnormalities. If there is persisting clinical concern, could repeat imaging with cardiac gating. 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 Electronically Signed   By: Kreg Shropshire M.D.   On: 01/06/2021 19:36    ECHO TEE   Result Date: 01/06/2021    TRANSESOPHOGEAL ECHO REPORT   Patient Name:   Amber [name] Date of Exam: 01/06/2021 Medical Rec #:  [id number]       Height:       62.0 in Accession #:    [id number]      Weight:       179.7 lb Date of Birth:  07/05/1970       BSA:          1.827 m Patient Age:    50 years        BP:           137/95 mmHg Patient Gender: F               HR:           110 bpm. Exam Location:  [facility] Procedure: Transesophageal Echo, Color Doppler, Cardiac Doppler and Saline            Contrast Bubble Study Indications:     Stroke  History:         Patient has prior history of Echocardiogram examinations, most                  recent 01/04/2021. Risk Factors:Diabetes and Dyslipidemia.                  Asthma; history of recurrent strokes.  Sonographer:     Humphrey Rolls RDCS (AE) Referring Phys:  [id number] Sondra Barges Diagnosing Phys: [name] MD PROCEDURE: After discussion of the risks and benefits of a TEE, an informed consent was obtained from the patient. TEE procedure time was 26 minutes. The transesophogeal probe was  passed without difficulty through the esophogus of the patient. Local oropharyngeal anesthetic was provided with viscous lidocaine. Sedation performed by performing physician. Image quality was adequate. The patient's vital signs; including heart rate, blood pressure, and oxygen saturation; remained stable throughout the procedure. The patient developed no complications during the procedure. IMPRESSIONS  1. Left ventricular ejection fraction, by estimation, is 55 to 60%. The left ventricle has normal function.  2. Right ventricular systolic function is normal. The right ventricular size is normal.  3. No left atrial/left atrial appendage thrombus was detected. The LAA emptying velocity was 76 cm/s.  4. The mitral valve is abnormal. Mild mitral valve regurgitation.  5. The aortic valve is tricuspid. Aortic valve regurgitation is not visualized.  6. There is a linear echodensity in and adjacent to the ascending aorta that most likely represents artifact. However, given cryptogenic strokes, further evaluation with CTA could be helpful to exclude aortic pathology.  7. Agitated saline contrast bubble study was negative, with no evidence of any interatrial shunt. FINDINGS  Left Ventricle: Left ventricular ejection fraction, by estimation, is 55 to 60%. The left ventricle has normal function. Right Ventricle: The right ventricular size is normal. Right ventricular systolic function is normal. Left Atrium: Left atrial size was normal in size. No left atrial/left atrial appendage thrombus was detected. The LAA emptying velocity was 76 cm/s. Right Atrium: Right atrial size was normal in size. Pericardium: There is no evidence of pericardial effusion. Mitral Valve: The mitral valve is abnormal. There is mild thickening of the posterior and anterior mitral valve leaflet(s). Normal mobility of the mitral valve leaflets. Mild mitral valve regurgitation. Tricuspid Valve: The tricuspid valve is normal in structure. Tricuspid valve  regurgitation is not demonstrated. Aortic Valve: The aortic valve is tricuspid. Aortic valve regurgitation is not visualized. Pulmonic Valve: The pulmonic valve was not well visualized. Pulmonic valve regurgitation is not visualized. Aorta: There is a linear echodensity in and adjacent to the ascending aorta that most likely represents artifact. However, given cryptogenic strokes, further evaluation with CTA could be helpful to exclude aortic pathology. The aortic root is normal in size and structure. IAS/Shunts: No atrial level shunt detected by color flow Doppler. Agitated saline contrast was given intravenously to evaluate for intracardiac shunting. Agitated saline contrast bubble study was negative, with no evidence of any interatrial shunt.   AORTA Ao Root diam: 3.30 cm [name] MD Electronically signed by [name] MD Signature Date/Time: 01/06/2021/1:31:49 PM    Final      Recent Labs (last 2 labs)       Recent Labs    01/07/21 0447 01/08/21 0537  WBC 6.8 5.6  HGB 15.4* 15.2*  HCT 47.3* 46.4*  PLT 290 284      Recent Labs (last 2 labs)      Recent Labs  01/08/21 0537  NA 138  K 3.9  CL 103  CO2 26  GLUCOSE 114*  BUN 14  CREATININE 1.02*  CALCIUM 9.7      No intake or output data in the 24 hours ending 01/08/21 0748      Physical Exam: Vital Signs Blood pressure 114/89, pulse 74, temperature (!) 97.5 F (36.4 C), temperature source Oral, resp. rate 16, weight 81.6 kg, SpO2 98 %.     General: No acute distress Mood and affect are appropriate Heart: Regular rate and rhythm no rubs murmurs or extra sounds Lungs: Clear to auscultation, breathing unlabored, no rales or wheezes Abdomen: Positive bowel sounds, soft nontender to palpation, nondistended Extremities: No clubbing, cyanosis, or edema Skin: No evidence of breakdown, no evidence of rash Neurologic: Cranial nerves II through XII intact, motor strength is 4/5 in bilateral deltoid, bicep, tricep, grip,  hip flexor, knee extensors, ankle dorsiflexor and plantar flexor Sensory exam normal sensation to light touch and proprioception in bilateral upper and lower extremities Cerebellar exam normal finger to nose to finger as well as heel to shin in bilateral upper and lower extremities Musculoskeletal: Full range of motion in all 4 extremities. No joint swelling Speech with neurogenic stuttering , min dysarthria    Assessment/Plan: 1. Functional deficits which require 3+ hours per day of interdisciplinary therapy in a comprehensive inpatient rehab setting. Physiatrist is providing close team supervision and 24 hour management of active medical problems listed below. Physiatrist and rehab team continue to assess barriers to discharge/monitor patient progress toward functional and medical goals   Care Tool:   Bathing          Bathing assist    Upper Body Dressing/Undressing Upper body dressing   Upper body assist   Lower Body Dressing/Undressing Lower body dressing              Lower body assist     Toileting Toileting   Toileting assist Assist for toileting: Minimal Assistance - Patient > 75%    Transfers Chair/bed transfer   Transfers assist        Locomotion Ambulation     Ambulation assist           Walk 10 feet activity     Assist         Walk 50 feet activity     Assist         Walk 150 feet activity     Assist        Walk 10 feet on uneven surface  activity     Assist         Wheelchair         Assist         Wheelchair 50 feet with 2 turns activity       Assist               Wheelchair 150 feet activity        Assist           Blood pressure 114/89, pulse 74, temperature (!) 97.5 F (36.4 C), temperature source Oral, resp. rate 16, weight 81.6 kg, SpO2 98 %.   Medical Problem List and Plan: 1.  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  Antithrombotics: -DVT/anticoagulation: Lovenox Pharmaceutical: Lovenox             -antiplatelet therapy: Aspirin 81 mg daily and Plavix 75 mg daily x3 weeks then Plavix alone 3. Pain Management:  Topamax for headaches, oxycodone as needed 4. Mood: Provide emotional support             -antipsychotic agents: N/A 5. Neuropsych: This patient is capable of making decisions on her own behalf. 6. Skin/Wound Care: Routine skin checks 7. Fluids/Electrolytes/Nutrition: Routine in and outs             CMP ordered for tomorrow a.m. 8.  Hyperlipidemia: Crestor 9.  Diabetes mellitus.  Hemoglobin A1c 6.2.  Patient on Glucophage 500 mg daily prior to admission.  Resume as needed CBG (last 3)  Recent Labs (last 2 labs)      Recent Labs    01/06/21 0948  GLUCAP 88                    Monitor with increased mobility 10.  Asthma.  Albuterol inhaler as needed.     LOS: 1 days A FACE TO FACE EVALUATION WAS PERFORMED   Erick Colace 01/08/2021, 7:48 AM

## 2021-01-08 NOTE — Progress Notes (Signed)
Inpatient Rehabilitation  Patient information reviewed and entered into eRehab system by Aaryanna Hyden Willie Loy, OTR/L.   Information including medical coding, functional ability and quality indicators will be reviewed and updated through discharge.    

## 2021-01-08 NOTE — Progress Notes (Signed)
Nutrition Note  RD consulted for diet education. Pt unavailable during attempted time of contact. Diet handouts "Heart Healthy Consistency Carbohydrate Nutrition Therapy" from the Academy of [employer] and Dietetics Manual placed in discharge instructions. RD to plan to revisit for diet education.   Roslyn Smiling, MS, RD, LDN RD pager number/after hours weekend pager number on Amion.

## 2021-01-08 NOTE — Progress Notes (Signed)
Inpatient Rehabilitation Center Individual Statement of Services  Patient Name:  Amber [name]  Date:  01/08/2021  Welcome to the Inpatient Rehabilitation Center.  Our goal is to provide you with an individualized program based on your diagnosis and situation, designed to meet your specific needs.  With this comprehensive rehabilitation program, you will be expected to participate in at least 3 hours of rehabilitation therapies Monday-Friday, with modified therapy programming on the weekends.  Your rehabilitation program will include the following services:  Physical Therapy (PT), Occupational Therapy (OT), Speech Therapy (ST), 24 hour per day rehabilitation nursing, Therapeutic Recreaction (TR), Neuropsychology, Care Coordinator, Rehabilitation Medicine, Nutrition Services, and Pharmacy Services  Weekly team conferences will be held on Wednesday to discuss your progress.  Your Inpatient Rehabilitation Care Coordinator will talk with you frequently to get your input and to update you on team discussions.  Team conferences with you and your family in attendance may also be held.  Expected length of stay: 8-11 Days  Overall anticipated outcome: Supervision-mod/I level  Depending on your progress and recovery, your program may change. Your Inpatient Rehabilitation Care Coordinator will coordinate services and will keep you informed of any changes. Your Inpatient Rehabilitation Care Coordinator's name and contact numbers are listed  below.  The following services may also be recommended but are not provided by the Inpatient Rehabilitation Center:  Driving Evaluations Home Health Rehabiltiation Services Outpatient Rehabilitation Services Vocational Rehabilitation   Arrangements will be made to provide these services after discharge if needed.  Arrangements include referral to agencies that provide these services.  Your insurance has been verified to be:  [employer] Your primary doctor is:  Vernona Rieger  Pertinent information will be shared with your doctor and your insurance company.  Inpatient Rehabilitation Care Coordinator:  Lavera Guise, [place] [phone] or [phone]  Information discussed with and copy given to patient by: Lucy Chris, 01/08/2021, 12:41 PM

## 2021-01-08 NOTE — H&P (Deleted)
PROGRESS NOTE   Subjective/Complaints: Pt asking if she needs loop recorder replaced, hers is from 2016 No pains  ROS- neg CP, SOB, N/V/D  Objective:   CT ANGIO CHEST AORTA W/CM &/OR WO/CM  Result Date: 01/06/2021 CLINICAL DATA:  Aortic abnormality on TEE, cryptogenic stroke EXAM: CT ANGIOGRAPHY CHEST WITH CONTRAST TECHNIQUE: Multidetector CT imaging of the chest was performed using the standard protocol during bolus administration of intravenous contrast. Multiplanar CT image reconstructions and MIPs were obtained to evaluate the vascular anatomy. CONTRAST:  OMNIPAQUE IOHEXOL 350 MG/ML SOLN COMPARISON:  None. FINDINGS: Cardiovascular: Evaluation of the aortic root and ascending aorta is limited by motion artifact on this non-cardiac gated CT angiography of the chest. No gross acute luminal abnormality is seen. Ascending thoracic aortic diameter is within normal limits. No acute luminal abnormality of the imaged aorta. No periaortic stranding or hemorrhage. Shared origin of the brachiocephalic and left common carotid arteries. Proximal great vessels are otherwise unremarkable. Central pulmonary arteries are normal caliber without large central or lobar filling defects on this non tailored examination of the pulmonary arteries. Normal heart size. No pericardial effusion. Mediastinum/Nodes: Fatty stippling in the anterior mediastinum, nonspecific though can reflect thymic remnant absence of traumatic findings other features to suggest a more aggressive process. No mediastinal fluid or gas. Normal thyroid gland and thoracic inlet. No acute abnormality of the trachea or esophagus. No worrisome mediastinal, hilar or axillary adenopathy. Lungs/Pleura: Airways are patent. No consolidation, features of edema, pneumothorax, or effusion. No suspicious pulmonary nodules or masses. Upper Abdomen: 15 mm fluid attenuation cyst the anterior right lobe liver.  No worrisome focal liver lesions. Smooth liver surface contour. Normal hepatic attenuation. Normal gallbladder and biliary tree. Musculoskeletal: No acute osseous abnormality or suspicious osseous lesion. No chest wall mass or suspicious bone lesions identified. Review of the MIP images confirms the above findings. IMPRESSION: Evaluation of the aortic root and ascending aorta is somewhat limited by cardiac pulsation/motion artifact on this nongated CT angiography of the chest. No gross luminal abnormality is seen accounting for this motion artifact. Normal size of the aortic root. No periaortic abnormalities. If there is persisting clinical concern, could repeat imaging with cardiac gating. 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 Electronically Signed   By: Kreg Shropshire M.D.   On: 01/06/2021 19:36   ECHO TEE  Result Date: 01/06/2021    TRANSESOPHOGEAL ECHO REPORT   Patient Name:   AKYRA BOUCHIE Date of Exam: 01/06/2021 Medical Rec #:  [id number]       Height:       62.0 in Accession #:    [id number]      Weight:       179.7 lb Date of Birth:  Jul 03, 1970       BSA:          1.827 m Patient Age:    50 years        BP:           137/95 mmHg Patient Gender: F               HR:  110 bpm. Exam Location:  [facility] Procedure: Transesophageal Echo, Color Doppler, Cardiac Doppler and Saline            Contrast Bubble Study Indications:     Stroke  History:         Patient has prior history of Echocardiogram examinations, most                  recent 01/04/2021. Risk Factors:Diabetes and Dyslipidemia.                  Asthma; history of recurrent strokes.  Sonographer:     Humphrey Rolls RDCS (AE) Referring Phys:  [id number] Sondra Barges Diagnosing Phys: [name] MD PROCEDURE: After discussion of the risks and benefits of a TEE, an informed consent was obtained from the patient. TEE procedure time was 26 minutes. The transesophogeal  probe was passed without difficulty through the esophogus of the patient. Local oropharyngeal anesthetic was provided with viscous lidocaine. Sedation performed by performing physician. Image quality was adequate. The patient's vital signs; including heart rate, blood pressure, and oxygen saturation; remained stable throughout the procedure. The patient developed no complications during the procedure. IMPRESSIONS  1. Left ventricular ejection fraction, by estimation, is 55 to 60%. The left ventricle has normal function.  2. Right ventricular systolic function is normal. The right ventricular size is normal.  3. No left atrial/left atrial appendage thrombus was detected. The LAA emptying velocity was 76 cm/s.  4. The mitral valve is abnormal. Mild mitral valve regurgitation.  5. The aortic valve is tricuspid. Aortic valve regurgitation is not visualized.  6. There is a linear echodensity in and adjacent to the ascending aorta that most likely represents artifact. However, given cryptogenic strokes, further evaluation with CTA could be helpful to exclude aortic pathology.  7. Agitated saline contrast bubble study was negative, with no evidence of any interatrial shunt. FINDINGS  Left Ventricle: Left ventricular ejection fraction, by estimation, is 55 to 60%. The left ventricle has normal function. Right Ventricle: The right ventricular size is normal. Right ventricular systolic function is normal. Left Atrium: Left atrial size was normal in size. No left atrial/left atrial appendage thrombus was detected. The LAA emptying velocity was 76 cm/s. Right Atrium: Right atrial size was normal in size. Pericardium: There is no evidence of pericardial effusion. Mitral Valve: The mitral valve is abnormal. There is mild thickening of the posterior and anterior mitral valve leaflet(s). Normal mobility of the mitral valve leaflets. Mild mitral valve regurgitation. Tricuspid Valve: The tricuspid valve is normal in structure.  Tricuspid valve regurgitation is not demonstrated. Aortic Valve: The aortic valve is tricuspid. Aortic valve regurgitation is not visualized. Pulmonic Valve: The pulmonic valve was not well visualized. Pulmonic valve regurgitation is not visualized. Aorta: There is a linear echodensity in and adjacent to the ascending aorta that most likely represents artifact. However, given cryptogenic strokes, further evaluation with CTA could be helpful to exclude aortic pathology. The aortic root is normal in size and structure. IAS/Shunts: No atrial level shunt detected by color flow Doppler. Agitated saline contrast was given intravenously to evaluate for intracardiac shunting. Agitated saline contrast bubble study was negative, with no evidence of any interatrial shunt.   AORTA Ao Root diam: 3.30 cm [name] MD Electronically signed by [name] MD Signature Date/Time: 01/06/2021/1:31:49 PM    Final    Recent Labs    01/07/21 0447 01/08/21 0537  WBC 6.8 5.6  HGB 15.4* 15.2*  HCT 47.3* 46.4*  PLT 290 284   Recent Labs    01/08/21 0537  NA 138  K 3.9  CL 103  CO2 26  GLUCOSE 114*  BUN 14  CREATININE 1.02*  CALCIUM 9.7   No intake or output data in the 24 hours ending 01/08/21 0748      Physical Exam: Vital Signs Blood pressure 114/89, pulse 74, temperature (!) 97.5 F (36.4 C), temperature source Oral, resp. rate 16, weight 81.6 kg, SpO2 98 %.   General: No acute distress Mood and affect are appropriate Heart: Regular rate and rhythm no rubs murmurs or extra sounds Lungs: Clear to auscultation, breathing unlabored, no rales or wheezes Abdomen: Positive bowel sounds, soft nontender to palpation, nondistended Extremities: No clubbing, cyanosis, or edema Skin: No evidence of breakdown, no evidence of rash Neurologic: Cranial nerves II through XII intact, motor strength is 4/5 in bilateral deltoid, bicep, tricep, grip, hip flexor, knee extensors, ankle dorsiflexor and plantar  flexor Sensory exam normal sensation to light touch and proprioception in bilateral upper and lower extremities Cerebellar exam normal finger to nose to finger as well as heel to shin in bilateral upper and lower extremities Musculoskeletal: Full range of motion in all 4 extremities. No joint swelling Speech with neurogenic stuttering , min dysarthria   Assessment/Plan: 1. Functional deficits which require 3+ hours per day of interdisciplinary therapy in a comprehensive inpatient rehab setting. Physiatrist is providing close team supervision and 24 hour management of active medical problems listed below. Physiatrist and rehab team continue to assess barriers to discharge/monitor patient progress toward functional and medical goals  Care Tool:  Bathing              Bathing assist       Upper Body Dressing/Undressing Upper body dressing        Upper body assist      Lower Body Dressing/Undressing Lower body dressing            Lower body assist       Toileting Toileting    Toileting assist Assist for toileting: Minimal Assistance - Patient > 75%     Transfers Chair/bed transfer  Transfers assist           Locomotion Ambulation   Ambulation assist              Walk 10 feet activity   Assist           Walk 50 feet activity   Assist           Walk 150 feet activity   Assist           Walk 10 feet on uneven surface  activity   Assist           Wheelchair     Assist               Wheelchair 50 feet with 2 turns activity    Assist            Wheelchair 150 feet activity     Assist          Blood pressure 114/89, pulse 74, temperature (!) 97.5 F (36.4 C), temperature source Oral, resp. rate 16, weight 81.6 kg, SpO2 98 %.  Medical Problem List and Plan: 1.  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  Antithrombotics: -DVT/anticoagulation: Lovenox Pharmaceutical: Lovenox             -antiplatelet therapy: Aspirin 81 mg daily and Plavix 75 mg daily x3 weeks then Plavix alone 3. Pain Management:  Topamax for headaches, oxycodone as needed 4. Mood: Provide emotional support             -antipsychotic agents: N/A 5. Neuropsych: This patient is capable of making decisions on her own behalf. 6. Skin/Wound Care: Routine skin checks 7. Fluids/Electrolytes/Nutrition: Routine in and outs             CMP ordered for tomorrow a.m. 8.  Hyperlipidemia: Crestor 9.  Diabetes mellitus.  Hemoglobin A1c 6.2.  Patient on Glucophage 500 mg daily prior to admission.  Resume as needed CBG (last 3)  Recent Labs    01/06/21 0948  GLUCAP 88                Monitor with increased mobility 10.  Asthma.  Albuterol inhaler as needed.    LOS: 1 days A FACE TO FACE EVALUATION WAS PERFORMED  Erick Colace 01/08/2021, 7:48 AM

## 2021-01-08 NOTE — Plan of Care (Signed)
  Problem: RH Balance Goal: LTG Patient will maintain dynamic sitting balance (PT) Description: LTG:  Patient will maintain dynamic sitting balance with assistance during mobility activities (PT) Flowsheets (Taken 01/08/2021 1911) LTG: Pt will maintain dynamic sitting balance during mobility activities with:: Independent Goal: LTG Patient will maintain dynamic standing balance (PT) Description: LTG:  Patient will maintain dynamic standing balance with assistance during mobility activities (PT) Flowsheets (Taken 01/08/2021 1911) LTG: Pt will maintain dynamic standing balance during mobility activities with:: Supervision/Verbal cueing   Problem: Sit to Stand Goal: LTG:  Patient will perform sit to stand with assistance level (PT) Description: LTG:  Patient will perform sit to stand with assistance level (PT) Flowsheets (Taken 01/08/2021 1911) LTG: PT will perform sit to stand in preparation for functional mobility with assistance level: Independent with assistive device   Problem: RH Bed Mobility Goal: LTG Patient will perform bed mobility with assist (PT) Description: LTG: Patient will perform bed mobility with assistance, with/without cues (PT). Flowsheets (Taken 01/08/2021 1911) LTG: Pt will perform bed mobility with assistance level of: Independent   Problem: RH Bed to Chair Transfers Goal: LTG Patient will perform bed/chair transfers w/assist (PT) Description: LTG: Patient will perform bed to chair transfers with assistance (PT). Flowsheets (Taken 01/08/2021 1911) LTG: Pt will perform Bed to Chair Transfers with assistance level: Independent with assistive device    Problem: RH Car Transfers Goal: LTG Patient will perform car transfers with assist (PT) Description: LTG: Patient will perform car transfers with assistance (PT). Flowsheets (Taken 01/08/2021 1911) LTG: Pt will perform car transfers with assist:: Supervision/Verbal cueing   Problem: RH Furniture Transfers Goal: LTG Patient  will perform furniture transfers w/assist (OT/PT) Description: LTG: Patient will perform furniture transfers  with assistance (OT/PT). Flowsheets (Taken 01/08/2021 1911) LTG: Pt will perform furniture transfers with assist:: Independent with assistive device    Problem: RH Ambulation Goal: LTG Patient will ambulate in controlled environment (PT) Description: LTG: Patient will ambulate in a controlled environment, # of feet with assistance (PT). Flowsheets (Taken 01/08/2021 1911) LTG: Pt will ambulate in controlled environ  assist needed:: Supervision/Verbal cueing LTG: Ambulation distance in controlled environment: >128ft using LRAD Goal: LTG Patient will ambulate in home environment (PT) Description: LTG: Patient will ambulate in home environment, # of feet with assistance (PT). Flowsheets (Taken 01/08/2021 1911) LTG: Pt will ambulate in home environ  assist needed:: Independent with assistive device LTG: Ambulation distance in home environment: 70ft using LRAD   Problem: RH Stairs Goal: LTG Patient will ambulate up and down stairs w/assist (PT) Description: LTG: Patient will ambulate up and down # of stairs with assistance (PT) Flowsheets (Taken 01/08/2021 1911) LTG: Pt will ambulate up/down stairs assist needed:: Supervision/Verbal cueing LTG: Pt will  ambulate up and down number of stairs: 12 steps using HRs per home set-up

## 2021-01-08 NOTE — Plan of Care (Signed)
  Problem: RH Expression Communication Goal: LTG Patient will increase speech intelligibility (SLP) Description: LTG: Patient will increase speech intelligibility at word/phrase/conversation level with cues, % of the time (SLP) Flowsheets (Taken 01/08/2021 1640) LTG: Patient will increase speech intelligibility (SLP): Modified Independent Level: Conversation level Percent of time patient will use intelligible speech: 90%   Problem: RH Problem Solving Goal: LTG Patient will demonstrate problem solving for (SLP) Description: LTG:  Patient will demonstrate problem solving for basic/complex daily situations with cues  (SLP) Flowsheets (Taken 01/08/2021 1640) LTG: Patient will demonstrate problem solving for (SLP): Complex daily situations LTG Patient will demonstrate problem solving for: Supervision   Problem: RH Memory Goal: LTG Patient will demonstrate ability for day to day (SLP) Description: LTG:   Patient will demonstrate ability for day to day recall/carryover during cognitive/linguistic activities with assist  (SLP) Flowsheets (Taken 01/08/2021 1640) LTG: Patient will demonstrate ability for day to day recall:  New information  Daily complex information LTG: Patient will demonstrate ability for day to day recall/carryover during cognitive/linguistic activities with assist (SLP): Supervision
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
## 2021-01-08 NOTE — Evaluation (Signed)
Occupational Therapy Assessment and Plan  Patient Details  Name: Amber [name] MRN: [id number] Date of Birth: 31-Mar-1971  OT Diagnosis: cognitive deficits, disturbance of vision, and muscle weakness (generalized) Rehab Potential: Rehab Potential (ACUTE ONLY): Excellent ELOS: 8-11 days   Today's Date: 01/08/2021 OT Individual Time: 5643-3295 OT Individual Time Calculation (min): 63 min     Hospital Problem: Principal Problem:   Acute CVA (cerebrovascular accident) ([place]) Active Problems:   Stroke (cerebrum) ([place])   Past Medical History:  Past Medical History:  Diagnosis Date   Anxiety    self reported   Asthma    Depression    controlled   Diabetes mellitus without complication ([place])    Hyperlipidemia    controlled with medication   IBS (irritable bowel syndrome)    Stroke ([place]) 03/13/2015   Tension headache 11/18/2020   Past Surgical History:  Past Surgical History:  Procedure Laterality Date   ABDOMINAL HYSTERECTOMY  10/2006   bowel reconstruction  10/2006   with hysterectomy   CESAREAN SECTION  2005   EP IMPLANTABLE DEVICE N/A 03/17/2015   Procedure: Loop Recorder Insertion;  Surgeon: Will Meredith Leeds, MD;  Location: [facility];  Service: Cardiovascular;  Laterality: N/A;   TEE WITHOUT CARDIOVERSION N/A 03/17/2015   Procedure: TRANSESOPHAGEAL ECHOCARDIOGRAM (TEE);  Surgeon: Skeet Latch, MD;  Location: [facility];  Service: Cardiovascular;  Laterality: N/A;   TEE WITHOUT CARDIOVERSION N/A 01/06/2021   Procedure: TRANSESOPHAGEAL ECHOCARDIOGRAM (TEE);  Surgeon: Nelva Bush, MD;  Location: [facility];  Service: Cardiovascular;  Laterality: N/A;   TOE SURGERY     Left 2nd metatarsal    Assessment & Plan Clinical Impression: Patient is a 50 y.o. year old female with recent admission to the hospital on 01/02/2021 with acute expressive aphasia and right facial droop.  Cranial CT unremarkable for acute intracranial process.  Remote left cerebellar lacunar  infarct.  Remote cortical infarct.  CT angiogram of head and neck no evidence of large vessel occlusion or stenosis.  Patient did receive tPA.  MRI identified acute left middle frontal gyrus and left parietal gyrus infarcts..  Patient transferred to CIR on 01/07/2021 .    Patient currently requires min with basic self-care skills secondary to muscle weakness, impaired timing and sequencing, unbalanced muscle activation, and decreased coordination, decreased attention, decreased problem solving, decreased memory, and delayed processing, and decreased standing balance and decreased balance strategies.  Prior to hospitalization, patient could complete ADLs with independent .  Patient will benefit from skilled intervention to decrease level of assist with basic self-care skills and increase independence with basic self-care skills prior to discharge home with care partner.  Anticipate patient will require intermittent supervision and follow up outpatient.  OT - End of Session Activity Tolerance: Decreased this session Endurance Deficit: Yes OT Assessment Rehab Potential (ACUTE ONLY): Excellent OT Patient demonstrates impairments in the following area(s): Balance;Cognition;Endurance;Motor OT Basic ADL's Functional Problem(s): Grooming;Bathing;Dressing;Toileting;Eating OT Advanced ADL's Functional Problem(s): Simple Meal Preparation OT Transfers Functional Problem(s): Tub/Shower OT Additional Impairment(s): Fuctional Use of Upper Extremity OT Plan OT Intensity: Minimum of 1-2 x/day, 45 to 90 minutes OT Frequency: 5 out of 7 days OT Duration/Estimated Length of Stay: 8-11 days OT Treatment/Interventions: Balance/vestibular training;Discharge planning;Cognitive remediation/compensation;Disease mangement/prevention;Community reintegration;DME/adaptive equipment instruction;Neuromuscular re-education;Psychosocial support;UE/LE Strength taining/ROM;Patient/family education;Functional mobility training;Self  Care/advanced ADL retraining;UE/LE Coordination activities;Therapeutic Activities OT Self Feeding Anticipated Outcome(s): independent OT Basic Self-Care Anticipated Outcome(s): modified independent OT Toileting Anticipated Outcome(s): modified independent OT Bathroom Transfers Anticipated Outcome(s): modified independent OT Recommendation Recommendations for  Other Services: Therapeutic Recreation consult Therapeutic Recreation Interventions: Stress management Patient destination: Home Follow Up Recommendations: Outpatient OT Equipment Recommended: To be determined   OT Evaluation Precautions/Restrictions  Precautions Precautions: Fall Restrictions Weight Bearing Restrictions: No  Pain Pain Assessment Pain Scale: 0-10 Pain Score: 0-No pain Home Living/Prior Functioning Home Living Living Arrangements: Children Available Help at Discharge: Family, Available 24 hours/day Type of Home: House Home Access: Level entry Home Layout: Two level, Bed/bath upstairs, 1/2 bath on main level Bathroom Shower/Tub: Tub/shower unit (on second floor) Additional Comments: Pt reports that between children and local family she will have 24/7 supervision.  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 Vision Baseline Vision/History: Wears glasses Wears Glasses: At all times (daughter is going to bring them) Patient Visual Report: Blurring of vision Vision Assessment?: Vision impaired- to be further tested in functional context Perception  Perception: Within Functional Limits Praxis Praxis: Intact Cognition Overall Cognitive  Status: Impaired/Different from baseline Arousal/Alertness: Awake/alert Orientation Level: Person;Place;Situation Person: Oriented Place: Oriented Situation: Oriented Year: 2022 Month: August Day of Week: Correct Memory: Impaired Immediate Memory Recall: Sock;Blue;Bed Memory Recall Sock: Without Cue Memory Recall Blue: Without Cue Memory Recall Bed: Without Cue Attention: Focused;Sustained;Selective Focused Attention: Appears intact Sustained Attention: Appears intact Selective Attention: Impaired Selective Attention Impairment: Verbal complex;Functional complex Awareness: Appears intact Problem Solving: Impaired (slower speed) Safety/Judgment: Appears intact Sensation Sensation Light Touch: Appears Intact Hot/Cold: Appears Intact Proprioception: Appears Intact Stereognosis: Appears Intact Coordination Gross Motor Movements are Fluid and Coordinated: No Fine Motor Movements are Fluid and Coordinated: No Coordination and Movement Description: BUE coordination slower than baseline with finger to nose, but uses functionally at a diminshed level with selfcare tasks. Heel Shin Test: no incoordination noted but pt did have difficulty lifting and holding up leg while completing this task in supine Motor   Slower coordination in BUEs compared to normal but no significant weakness noted Trunk/Postural Assessment  Cervical Assessment Cervical Assessment: Exceptions to [facility] (pt holds head into fixed position with mobility,) Thoracic Assessment Thoracic Assessment: Exceptions to [facility] (slight thoracic rounding) Lumbar Assessment Lumbar Assessment: Within Functional Limits Postural Control Postural Control: Deficits on evaluation  Balance Balance Balance Assessed: Yes Static Sitting Balance Static Sitting - Balance Support: Feet supported Static Sitting - Level of Assistance: 7: Independent Dynamic Sitting Balance Dynamic Sitting - Balance Support: During functional  activity Dynamic Sitting - Level of Assistance: 5: Stand by assistance Static Standing Balance Static Standing - Balance Support: During functional activity Static Standing - Level of Assistance: 4: Min assist Dynamic Standing Balance Dynamic Standing - Balance Support: During functional activity Dynamic Standing - Level of Assistance: 3: Mod assist Extremity/Trunk Assessment RUE Assessment RUE Assessment: Exceptions to [facility] Active Range of Motion (AROM) Comments: WFLS General Strength Comments: 4+/5 throughout.  Pt with slower gross and FM coordination noted with all tasks but only midly impaired. LUE Assessment LUE Assessment: Exceptions to [facility] Active Range of Motion (AROM) Comments: WFLs General Strength Comments: 4+/5 throughout.  Pt with slower gross and FM coordination noted with all tasks but only midly impaired.  Care Tool Care Tool Self Care Eating   Eating Assist Level: Set up assist    Oral Care    Oral Care Assist Level: Minimal Assistance -  Patient > 75%    Bathing   Body parts bathed by patient: Right arm;Left arm;Chest;Abdomen;Front perineal area;Buttocks;Right upper leg;Left upper leg;Right lower leg;Left lower leg;Face     Assist Level: Moderate Assistance - Patient 50 - 74%    Upper Body Dressing(including orthotics)   What is the patient wearing?: Pull over shirt   Assist Level: Supervision/Verbal cueing    Lower Body Dressing (excluding footwear)   What is the patient wearing?: Underwear/pull up;Pants Assist for lower body dressing: Minimal Assistance - Patient > 75%    Putting on/Taking off footwear   What is the patient wearing?: Socks;Non-skid slipper socks Assist for footwear: Supervision/Verbal cueing       Care Tool Toileting Toileting activity   Assist for toileting: Minimal Assistance - Patient > 75%     Care Tool Bed Mobility Roll left and right activity        Sit to lying activity        Lying to sitting edge of bed activity    Lying to sitting edge of bed assist level: Supervision/Verbal cueing     Care Tool Transfers Sit to stand transfer   Sit to stand assist level: Minimal Assistance - Patient > 75%    Chair/bed transfer   Chair/bed transfer assist level: Minimal Assistance - Patient > 75%     Toilet transfer   Assist Level: Minimal Assistance - Patient > 75%     Care Tool Cognition Expression of Ideas and Wants Expression of Ideas and Wants: Some difficulty - exhibits some difficulty with expressing needs and ideas (e.g, some words or finishing thoughts) or speech is not clear   Understanding Verbal and Non-Verbal Content Understanding Verbal and Non-Verbal Content: Usually understands - understands most conversations, but misses some part/intent of message. Requires cues at times to understand   Memory/Recall Ability *first 3 days only Memory/Recall Ability *first 3 days only: Current season;That he or she is in a hospital/hospital unit;Staff names and faces    Refer to Care Plan for Long Term Goals  SHORT TERM GOAL WEEK 1 OT Short Term Goal 1 (Week 1): Pt will complete all toilet transfers at [place] level with [place]. OT Short Term Goal 2 (Week 1): Pt will complete all bathing and dressing with supervision sit to stand. OT Short Term Goal 3 (Week 1): Pt will maintain alternating attention during selfcare tasks with no more than min instructional cueing during ADL tasks.  Recommendations for other services: Therapeutic Recreation  Stress management   Skilled Therapeutic Intervention ADL ADL Eating: Set up Where Assessed-Eating: Chair Grooming: Minimal assistance Where Assessed-Grooming: Standing at sink Upper Body Bathing: Minimal assistance Where Assessed-Upper Body Bathing: Shower Lower Body Bathing: Moderate assistance Where Assessed-Lower Body Bathing: Shower Upper Body Dressing: Supervision/safety Where Assessed-Upper Body Dressing: Edge of bed Lower Body Dressing: Minimal  assistance Where Assessed-Lower Body Dressing: Edge of bed Where Assessed-Toileting: Bedside Commode Toilet Transfer: Minimal assistance Toilet Transfer Method: Counselling psychologist: Radiographer, therapeutic: Not assessed Social research officer, government: Minimal assistance Social research officer, government Method: Heritage manager: Other (comment) (3:1) Mobility  Bed Mobility Bed Mobility: Supine to Sit Supine to Sit: Supervision/Verbal cueing Transfers Sit to Stand: Minimal Assistance - Patient > 75% Stand to Sit: Minimal Assistance - Patient > 75%  Session Note:  Pt worked on Dance movement psychotherapist sit to stand during shower.  Noted slow short step length bilaterally with ambulation to the bathroom and head remained fixed with guarding posture throughout secondary to increased  fear of falling.  She completed bathing but needed min questioning cueing to remember to rinse all parts of her body.  Min assist for LB dressing sit to stand at the EOB with supervision for UB.  Pt with slight dysarthria noted as well and reports some blurry vision and dizziness which will be further evaluated during treatment.  She reports having 24 hr supervision if needed when she leaves from family and her daughter.  Anticipate modified independent level for basic selfcare tasks.    Discharge Criteria: Patient will be discharged from OT if patient refuses treatment 3 consecutive times without medical reason, if treatment goals not met, if there is a change in medical status, if patient makes no progress towards goals or if patient is discharged from hospital.  The above assessment, treatment plan, treatment alternatives and goals were discussed and mutually agreed upon: by patient  Mohogany Toppins OTR/L 01/08/2021, 4:41 PM

## 2021-01-09 ENCOUNTER — Inpatient Hospital Stay (HOSPITAL_COMMUNITY): Payer: BC Managed Care – PPO

## 2021-01-09 DIAGNOSIS — M79606 Pain in leg, unspecified: Secondary | ICD-10-CM

## 2021-01-09 MED ORDER — OXYCODONE HCL 5 MG PO TABS: 2.5000 mg | ORAL_TABLET | Freq: Four times a day (QID) | ORAL | Status: DC | PRN

## 2021-01-09 MED ORDER — POTASSIUM CHLORIDE 20 MEQ PO PACK
40.0000 meq | PACK | Freq: Once | ORAL | Status: AC
  Administered 2021-01-09: 40 meq via ORAL
  Filled 2021-01-09: qty 2

## 2021-01-09 MED ORDER — MAGNESIUM GLUCONATE 500 MG PO TABS
250.0000 mg | ORAL_TABLET | Freq: Every day | ORAL | Status: DC
  Administered 2021-01-09 – 2021-01-14 (×6): 250 mg via ORAL
  Filled 2021-01-09 (×7): qty 1

## 2021-01-09 NOTE — Progress Notes (Signed)
Speech Language Pathology Daily Session Note  Patient Details  Name: Amber [name] MRN: [id number] Date of Birth: March 27, 1971  Today's Date: 01/09/2021 SLP Individual Time: 1400-1445 SLP Individual Time Calculation (min): 45 min  Short Term Goals: Week 1: SLP Short Term Goal 1 (Week 1): Patient will implement speech intelligibility strategies to ahieve 90% intelligibility at conversation level with Mod I SLP Short Term Goal 2 (Week 1): Patient will complete complex problem solving tasks with sup-to-min A verbal cues SLP Short Term Goal 3 (Week 1): Patient will recall novel information with sup-to-min A verbal cues to implement internal/external memory strategies SLP Short Term Goal 4 (Week 1): Patient will complete medication and money management tasks with sup A verbal cues  Skilled Therapeutic Interventions: Patient agreeable to skilled ST intervention with focus on speech and cognitive goals. Patient presented with improved speech intelligibility and more natural fluency. She continues to exhibit difficulty with production of multisyllabic words however able to self monitor and repair by pausing between words and reducing rate of speech with mod I. SLP left patient with multisyllabic word list for additional practice over the weekend. Facilitated cognitive-linguistic task by providing overall sup A verbal cues for working memory and alternating attention and benefited from additional processing time. Patient was left in bed with alarm activated and immediate needs within reach at end of session. Continue per current plan of care.      Pain Pain Assessment Pain Scale: 0-10 Pain Score: 0-No pain  Therapy/Group: Individual Therapy  [name] 01/09/2021, 3:36 PM

## 2021-01-09 NOTE — Telephone Encounter (Signed)
PPW placed on Amber [name]'s desk for evaluation once she returns.

## 2021-01-09 NOTE — Progress Notes (Signed)
Bilateral lower extremity venous duplex has been completed. Preliminary results can be found in CV Proc through chart review.   01/09/21 1:32 PM Olen Cordial RVT

## 2021-01-09 NOTE — Progress Notes (Signed)
PROGRESS NOTE   Subjective/Complaints: Mrs. Stifter is very pleasant, working with Fayrene Fearing OT in gym. Her only complaint is bilateral lower extremity cramping that is worse at night. She asks whether she could have a clot.   ROS- denies CP, SOB, N/V/D, +bilateral lower extremity cramping   Objective:    Imaging Results (Last 48 hours)  CT ANGIO CHEST AORTA W/CM &/OR WO/CM   Result Date: 01/06/2021 CLINICAL DATA:  Aortic abnormality on TEE, cryptogenic stroke EXAM: CT ANGIOGRAPHY CHEST WITH CONTRAST TECHNIQUE: Multidetector CT imaging of the chest was performed using the standard protocol during bolus administration of intravenous contrast. Multiplanar CT image reconstructions and MIPs were obtained to evaluate the vascular anatomy. CONTRAST:  OMNIPAQUE IOHEXOL 350 MG/ML SOLN COMPARISON:  None. FINDINGS: Cardiovascular: Evaluation of the aortic root and ascending aorta is limited by motion artifact on this non-cardiac gated CT angiography of the chest. No gross acute luminal abnormality is seen. Ascending thoracic aortic diameter is within normal limits. No acute luminal abnormality of the imaged aorta. No periaortic stranding or hemorrhage. Shared origin of the brachiocephalic and left common carotid arteries. Proximal great vessels are otherwise unremarkable. Central pulmonary arteries are normal caliber without large central or lobar filling defects on this non tailored examination of the pulmonary arteries. Normal heart size. No pericardial effusion. Mediastinum/Nodes: Fatty stippling in the anterior mediastinum, nonspecific though can reflect thymic remnant absence of traumatic findings other features to suggest a more aggressive process. No mediastinal fluid or gas. Normal thyroid gland and thoracic inlet. No acute abnormality of the trachea or esophagus. No worrisome mediastinal, hilar or axillary adenopathy. Lungs/Pleura: Airways are patent. No consolidation, features of edema, pneumothorax,  or effusion. No suspicious pulmonary nodules or masses. Upper Abdomen: 15 mm fluid attenuation cyst the anterior right lobe liver. No worrisome focal liver lesions. Smooth liver surface contour. Normal hepatic attenuation. Normal gallbladder and biliary tree. Musculoskeletal: No acute osseous abnormality or suspicious osseous lesion. No chest wall mass or suspicious bone lesions identified. Review of the MIP images confirms the above findings. IMPRESSION: Evaluation of the aortic root and ascending aorta is somewhat limited by cardiac pulsation/motion artifact on this nongated CT angiography of the chest. No gross luminal abnormality is seen accounting for this motion artifact. Normal size of the aortic root. No periaortic abnormalities. If there is persisting clinical concern, could repeat imaging with cardiac gating. 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 Electronically Signed   By: Kreg Shropshire M.D.   On: 01/06/2021 19:36    ECHO TEE   Result Date: 01/06/2021    TRANSESOPHOGEAL ECHO REPORT   Patient Name:   Amber [name] Date of Exam: 01/06/2021 Medical Rec #:  [id number]       Height:       62.0 in Accession #:    [id number]      Weight:       179.7 lb Date of Birth:  Sep 11, 1970       BSA:          1.827 m Patient Age:    50 years        BP:           137/95 mmHg Patient Gender: F               HR:           110 bpm. Exam Location:  [facility] Procedure: Transesophageal Echo, Color Doppler, Cardiac Doppler and  Saline            Contrast Bubble Study Indications:     Stroke  History:         Patient has prior history of Echocardiogram examinations, most                  recent 01/04/2021. Risk Factors:Diabetes and Dyslipidemia.                  Asthma; history of recurrent strokes.  Sonographer:     Humphrey Rolls RDCS (AE) Referring Phys:  [id number] Sondra Barges Diagnosing Phys: [name] MD PROCEDURE: After discussion of the  risks and benefits of a TEE, an informed consent was obtained from the patient. TEE procedure time was 26 minutes. The transesophogeal probe was passed without difficulty through the esophogus of the patient. Local oropharyngeal anesthetic was provided with viscous lidocaine. Sedation performed by performing physician. Image quality was adequate. The patient's vital signs; including heart rate, blood pressure, and oxygen saturation; remained stable throughout the procedure. The patient developed no complications during the procedure. IMPRESSIONS  1. Left ventricular ejection fraction, by estimation, is 55 to 60%. The left ventricle has normal function.  2. Right ventricular systolic function is normal. The right ventricular size is normal.  3. No left atrial/left atrial appendage thrombus was detected. The LAA emptying velocity was 76 cm/s.  4. The mitral valve is abnormal. Mild mitral valve regurgitation.  5. The aortic valve is tricuspid. Aortic valve regurgitation is not visualized.  6. There is a linear echodensity in and adjacent to the ascending aorta that most likely represents artifact. However, given cryptogenic strokes, further evaluation with CTA could be helpful to exclude aortic pathology.  7. Agitated saline contrast bubble study was negative, with no evidence of any interatrial shunt. FINDINGS  Left Ventricle: Left ventricular ejection fraction, by estimation, is 55 to 60%. The left ventricle has normal function. Right Ventricle: The right ventricular size is normal. Right ventricular systolic function is normal. Left Atrium: Left atrial size was normal in size. No left atrial/left atrial appendage thrombus was detected. The LAA emptying velocity was 76 cm/s. Right Atrium: Right atrial size was normal in size. Pericardium: There is no evidence of pericardial effusion. Mitral Valve: The mitral valve is abnormal. There is mild thickening of the posterior and anterior mitral valve leaflet(s). Normal  mobility of the mitral valve leaflets. Mild mitral valve regurgitation. Tricuspid Valve: The tricuspid valve is normal in structure. Tricuspid valve regurgitation is not demonstrated. Aortic Valve: The aortic valve is tricuspid. Aortic valve regurgitation is not visualized. Pulmonic Valve: The pulmonic valve was not well visualized. Pulmonic valve regurgitation is not visualized. Aorta: There is a linear echodensity in and adjacent to the ascending aorta that most likely represents artifact. However, given cryptogenic strokes, further evaluation with CTA could be helpful to exclude aortic pathology. The aortic root is normal in size and structure. IAS/Shunts: No atrial level shunt detected by color flow Doppler. Agitated saline contrast was given intravenously to evaluate for intracardiac shunting. Agitated saline contrast bubble study was negative, with no evidence of any interatrial shunt.   AORTA Ao Root diam: 3.30 cm [name] MD Electronically signed by [name] MD Signature Date/Time: 01/06/2021/1:31:49 PM    Final      Recent Labs (last 2 labs)       Recent Labs    01/07/21 0447 01/08/21 0537  WBC 6.8 5.6  HGB 15.4* 15.2*  HCT 47.3* 46.4*  PLT 290 284      Recent Labs (last 2 labs)      Recent Labs    01/08/21 0537  NA 138  K 3.9  CL 103  CO2 26  GLUCOSE 114*  BUN 14  CREATININE 1.02*  CALCIUM 9.7      No intake or output data in the 24 hours ending 01/08/21 0748      Physical Exam: Vital Signs Blood pressure 114/89, pulse 74, temperature (!) 97.5 F (36.4 C), temperature source Oral, resp. rate 16, weight 81.6 kg, SpO2 98 %. Gen: no distress, normal appearing HEENT: oral mucosa pink and moist, NCAT Cardio: Reg rate Chest: normal effort, normal rate of breathing Abd: soft, non-distended  Extremities: No clubbing, cyanosis, or edema Skin: No evidence of breakdown, no evidence of rash Neurologic: Cranial nerves II through XII intact, motor strength is 4/5  in bilateral deltoid, bicep, tricep, grip, hip flexor, knee extensors, ankle dorsiflexor and plantar flexor Sensory exam normal sensation to light touch and proprioception in bilateral upper and lower extremities Cerebellar exam normal finger to nose to finger as well as heel to shin in bilateral upper and lower extremities Musculoskeletal: Full range of motion in all 4 extremities. No joint swelling Speech with neurogenic stuttering , min dysarthria    Assessment/Plan: 1. Functional deficits which require 3+ hours per day of interdisciplinary therapy in a comprehensive inpatient rehab setting. Physiatrist is providing close team supervision and 24 hour management of active medical problems listed below. Physiatrist and rehab team continue to assess barriers to discharge/monitor patient progress toward functional and medical goals   Care Tool:   Bathing          Bathing assist    Upper Body Dressing/Undressing Upper body dressing   Upper body assist   Lower Body Dressing/Undressing Lower body dressing              Lower body assist     Toileting Toileting   Toileting assist Assist for toileting: Minimal Assistance - Patient > 75%    Transfers Chair/bed transfer   Transfers assist        Locomotion Ambulation     Ambulation assist           Walk 10 feet activity     Assist         Walk 50 feet activity     Assist         Walk 150 feet activity     Assist        Walk 10 feet on uneven surface  activity     Assist         Wheelchair         Assist         Wheelchair 50 feet with 2 turns activity       Assist               Wheelchair 150 feet activity        Assist           Blood pressure 114/89, pulse 74, temperature (!) 97.5 F (36.4 C), temperature source Oral, resp. rate 16, weight 81.6 kg, SpO2 98 %.   Medical Problem List and Plan: 1.  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  Impaired mobility -DVT/anticoagulation: Continue Lovenox             -antiplatelet therapy: Aspirin 81 mg daily and Plavix 75 mg daily x3 weeks then Plavix alone 3. Headaches: Topamax for headaches, Decrease oxycodone to 2.5mg  q6H as needed 4. Mood: Provide emotional support             -antipsychotic agents: N/A 5. Neuropsych: This patient is capable of making decisions on her own behalf. 6. Skin/Wound Care: Routine skin checks 7. Fluids/Electrolytes/Nutrition: Routine in and outs 8.  Hyperlipidemia: Continue Crestor 9.  Diabetes mellitus.  Hemoglobin A1c 6.2.  Patient on Glucophage 500 mg daily prior to admission.  Resume as needed CBG (last 3)  Recent Labs (last 2 labs)      Recent Labs    01/06/21 0948  GLUCAP 88                    Monitor with increased mobility 10.  Asthma.  Albuterol inhaler as needed. 11. Bilateral lower extremity cramping: Vascular ultrasound ordered of bilateral lower extremities to assess for clot. Will given potassium and start mag gluconate 250mg  at night- discussed risks and benefits of this supplement.      LOS: 1 days A FACE TO FACE EVALUATION WAS PERFORMED   01/08/2021, 7:48 AM

## 2021-01-09 NOTE — Progress Notes (Signed)
Physical Therapy Session Note  Patient Details  Name: Amber [name] MRN: [id number] Date of Birth: 08/28/1970  Today's Date: 01/09/2021 PT Individual Time: 9735-3299; 2426-8341 PT Individual Time Calculation (min): 60 min and 34 min  Short Term Goals: Week 1:  PT Short Term Goal 1 (Week 1): = to LTGs based on ELOS  Skilled Therapeutic Interventions/Progress Updates:  Session 1  Pt received supine in bed, very pleasant and denied pain. Pt requested to bathe and get dressed, so emphasis of session on ADLs and transfers. Pt performed bed mobility mod I w/bedrails and performed UE and LE bathing w/washcloth at EOB w/supervision. Pt donned shirt at EOB w/supervision, shoes w/max A and performed sit <>stand and donned pants in standing w/supervision. Pt ambulated 5' to sink and remained standing w/LUE support at sink for 10 minutes w/supervision. Pt ambulated 61' w/no AD very slowly w/supervision, provided encouragement and heavy verbal cues for improved step length, clearance and cadence. Pt demonstrates fear-avoidance behavior and is very fearful of falling, provided encouragement to boost confidence. Pt was left sitting in recliner in room w/all needs in reach.   Session 2  Pt received sitting in recliner in room and denied pain. Emphasis of session on balance assessment. Pt performed stand pivot from recliner to [facility] without AD w/supervision and was transported to ortho gym w/total A for time management. Pt completed Berg without AD and scored a 44/56. Educated pt on results interpretation and pt aware that her fear affected her score more so than balance impairments. Pt transported back to room w/total A and performed stand pivot to recliner w/supervision. Pt was left sitting in recliner with all needs in reach.   Therapy Documentation Precautions:  Precautions Precautions: Fall Restrictions Weight Bearing Restrictions: No   Therapy/Group: Individual Therapy Jill Alexanders Blannie Shedlock, PT,  DPT  01/09/2021, 7:37 AM
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
## 2021-01-09 NOTE — Plan of Care (Signed)
Nutrition Education Note  RD consulted for nutrition diet education.  Lipid Panel     Component Value Date/Time   CHOL 181 01/02/2021 1343   CHOL 184 08/28/2019 1537   TRIG 94 01/02/2021 1343   HDL 76 01/02/2021 1343   HDL 75 08/28/2019 1537   CHOLHDL 2.4 01/02/2021 1343   VLDL 19 01/02/2021 1343   LDLCALC 86 01/02/2021 1343   LDLCALC 85 08/28/2019 1537    RD provided "Heart Healthy Consistency Carbohydrate Nutrition Therapy" handout from the Academy of [employer]etics. Reviewed patient's dietary recall. Provided examples on ways to decrease sodium and fat intake in diet. Discouraged intake of processed foods and use of salt shaker. Encouraged fresh fruits and vegetables as well as whole grain sources of carbohydrates to maximize fiber intake. Discussed Diabetic friendly drink options.Teach back method used.  Expect good compliance.  Current diet order is heart healthy, patient is consuming approximately 100% of meals at this time. Labs and medications reviewed. No further nutrition interventions warranted at this time. RD contact information provided. If additional nutrition issues arise, please re-consult RD.  Roslyn Smiling, MS, RD, LDN RD pager number/after hours weekend pager number on Amion.

## 2021-01-10 DIAGNOSIS — E119 Type 2 diabetes mellitus without complications: Secondary | ICD-10-CM

## 2021-01-10 DIAGNOSIS — R252 Cramp and spasm: Secondary | ICD-10-CM

## 2021-01-10 DIAGNOSIS — E1159 Type 2 diabetes mellitus with other circulatory complications: Secondary | ICD-10-CM

## 2021-01-10 DIAGNOSIS — G441 Vascular headache, not elsewhere classified: Secondary | ICD-10-CM

## 2021-01-10 NOTE — Progress Notes (Signed)
Speech Language Pathology Daily Session Note  Patient Details  Name: Amber [name] MRN: [id number] Date of Birth: 28-Jun-1970  Today's Date: 01/10/2021 SLP Individual Time: 4496-7591 SLP Individual Time Calculation (min): 51 min  Short Term Goals: Week 1: SLP Short Term Goal 1 (Week 1): Patient will implement speech intelligibility strategies to ahieve 90% intelligibility at conversation level with Mod I SLP Short Term Goal 2 (Week 1): Patient will complete complex problem solving tasks with sup-to-min A verbal cues SLP Short Term Goal 3 (Week 1): Patient will recall novel information with sup-to-min A verbal cues to implement internal/external memory strategies SLP Short Term Goal 4 (Week 1): Patient will complete medication and money management tasks with sup A verbal cues  Skilled Therapeutic Interventions:Skilled ST services focused on cognitive skills. Pt presented multi-syllable homework for the weekend (pt supported will work on today) and demonstrated 85% intelligibility in conversation with ability to self-correct articulation errors mod I. SLP facilitated medication management with current medication using BID pill organizer, pt required supervision A verbal cues for initial comprehension of task fading quickly to mod I. Pt demonstrated alternating attention mod I in task and conversation. Pt was left in room with call bell within reach and bed alarm set. SLP recommends to continue skilled services.     Pain Pain Assessment Pain Score: 0-No pain  Therapy/Group: Individual Therapy  Kaylie Ritter  [facility] 01/10/2021, 10:49 AM

## 2021-01-10 NOTE — Progress Notes (Signed)
PROGRESS NOTE   Subjective/Complaints: Patient seen laying in bed this morning.  She states she slept well overnight.  She has questions regarding her ultrasound performed yesterday.  Reviewed with patient.  ROS: Denies CP, SOB, N/V/D   Objective:    Imaging Results (Last 48 hours)  CT ANGIO CHEST AORTA W/CM &/OR WO/CM   Result Date: 01/06/2021 CLINICAL DATA:  Aortic abnormality on TEE, cryptogenic stroke EXAM: CT ANGIOGRAPHY CHEST WITH CONTRAST TECHNIQUE: Multidetector CT imaging of the chest was performed using the standard protocol during bolus administration of intravenous contrast. Multiplanar CT image reconstructions and MIPs were obtained to evaluate the vascular anatomy. CONTRAST:  OMNIPAQUE IOHEXOL 350 MG/ML SOLN COMPARISON:  None. FINDINGS: Cardiovascular: Evaluation of the aortic root and ascending aorta is limited by motion artifact on this non-cardiac gated CT angiography of the chest. No gross acute luminal abnormality is seen. Ascending thoracic aortic diameter is within normal limits. No acute luminal abnormality of the imaged aorta. No periaortic stranding or hemorrhage. Shared origin of the brachiocephalic and left common carotid arteries. Proximal great vessels are otherwise unremarkable. Central pulmonary arteries are normal caliber without large central or lobar filling defects on this non tailored examination of the pulmonary arteries. Normal heart size. No pericardial effusion. Mediastinum/Nodes: Fatty stippling in the anterior mediastinum, nonspecific though can reflect thymic remnant absence of traumatic findings other features to suggest a more aggressive process. No mediastinal fluid or gas. Normal thyroid gland and thoracic inlet. No acute abnormality of the trachea or esophagus. No worrisome mediastinal, hilar or axillary adenopathy. Lungs/Pleura: Airways are patent. No consolidation, features of edema, pneumothorax, or effusion. No suspicious pulmonary nodules or  masses. Upper Abdomen: 15 mm fluid attenuation cyst the anterior right lobe liver. No worrisome focal liver lesions. Smooth liver surface contour. Normal hepatic attenuation. Normal gallbladder and biliary tree. Musculoskeletal: No acute osseous abnormality or suspicious osseous lesion. No chest wall mass or suspicious bone lesions identified. Review of the MIP images confirms the above findings. IMPRESSION: Evaluation of the aortic root and ascending aorta is somewhat limited by cardiac pulsation/motion artifact on this nongated CT angiography of the chest. No gross luminal abnormality is seen accounting for this motion artifact. Normal size of the aortic root. No periaortic abnormalities. If there is persisting clinical concern, could repeat imaging with cardiac gating. 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 Electronically Signed   By: Kreg Shropshire M.D.   On: 01/06/2021 19:36    ECHO TEE   Result Date: 01/06/2021    TRANSESOPHOGEAL ECHO REPORT   Patient Name:   FLORITA NITSCH Date of Exam: 01/06/2021 Medical Rec #:  [id number]       Height:       62.0 in Accession #:    [id number]      Weight:       179.7 lb Date of Birth:  1971-01-07       BSA:          1.827 m Patient Age:    50 years        BP:           137/95 mmHg Patient Gender: F               HR:           110 bpm. Exam Location:  [facility] Procedure: Transesophageal Echo, Color Doppler, Cardiac Doppler and Saline  Contrast Bubble Study Indications:     Stroke  History:         Patient has prior history of Echocardiogram examinations, most                  recent 01/04/2021. Risk Factors:Diabetes and Dyslipidemia.                  Asthma; history of recurrent strokes.  Sonographer:     Humphrey Rolls RDCS (AE) Referring Phys:  [id number] Sondra Barges Diagnosing Phys: [name] MD PROCEDURE: After discussion of the risks and benefits of a TEE, an informed consent  was obtained from the patient. TEE procedure time was 26 minutes. The transesophogeal probe was passed without difficulty through the esophogus of the patient. Local oropharyngeal anesthetic was provided with viscous lidocaine. Sedation performed by performing physician. Image quality was adequate. The patient's vital signs; including heart rate, blood pressure, and oxygen saturation; remained stable throughout the procedure. The patient developed no complications during the procedure. IMPRESSIONS  1. Left ventricular ejection fraction, by estimation, is 55 to 60%. The left ventricle has normal function.  2. Right ventricular systolic function is normal. The right ventricular size is normal.  3. No left atrial/left atrial appendage thrombus was detected. The LAA emptying velocity was 76 cm/s.  4. The mitral valve is abnormal. Mild mitral valve regurgitation.  5. The aortic valve is tricuspid. Aortic valve regurgitation is not visualized.  6. There is a linear echodensity in and adjacent to the ascending aorta that most likely represents artifact. However, given cryptogenic strokes, further evaluation with CTA could be helpful to exclude aortic pathology.  7. Agitated saline contrast bubble study was negative, with no evidence of any interatrial shunt. FINDINGS  Left Ventricle: Left ventricular ejection fraction, by estimation, is 55 to 60%. The left ventricle has normal function. Right Ventricle: The right ventricular size is normal. Right ventricular systolic function is normal. Left Atrium: Left atrial size was normal in size. No left atrial/left atrial appendage thrombus was detected. The LAA emptying velocity was 76 cm/s. Right Atrium: Right atrial size was normal in size. Pericardium: There is no evidence of pericardial effusion. Mitral Valve: The mitral valve is abnormal. There is mild thickening of the posterior and anterior mitral valve leaflet(s). Normal mobility of the mitral valve leaflets. Mild mitral  valve regurgitation. Tricuspid Valve: The tricuspid valve is normal in structure. Tricuspid valve regurgitation is not demonstrated. Aortic Valve: The aortic valve is tricuspid. Aortic valve regurgitation is not visualized. Pulmonic Valve: The pulmonic valve was not well visualized. Pulmonic valve regurgitation is not visualized. Aorta: There is a linear echodensity in and adjacent to the ascending aorta that most likely represents artifact. However, given cryptogenic strokes, further evaluation with CTA could be helpful to exclude aortic pathology. The aortic root is normal in size and structure. IAS/Shunts: No atrial level shunt detected by color flow Doppler. Agitated saline contrast was given intravenously to evaluate for intracardiac shunting. Agitated saline contrast bubble study was negative, with no evidence of any interatrial shunt.   AORTA Ao Root diam: 3.30 cm [name] MD Electronically signed by [name] MD Signature Date/Time: 01/06/2021/1:31:49 PM    Final      Recent Labs (last 2 labs)       Recent Labs    01/07/21 0447 01/08/21 0537  WBC 6.8 5.6  HGB 15.4* 15.2*  HCT 47.3* 46.4*  PLT 290 284      Recent Labs (last 2  labs)      Recent Labs    01/08/21 0537  NA 138  K 3.9  CL 103  CO2 26  GLUCOSE 114*  BUN 14  CREATININE 1.02*  CALCIUM 9.7      No intake or output data in the 24 hours ending 01/08/21 0748      Physical Exam: BP 105/85 (BP Location: Left Arm)   Pulse 72   Temp 98.5 F (36.9 C) (Oral)   Resp 18   Wt 80.1 kg   SpO2 99%   BMI 32.30 kg/m  Constitutional: No distress . Vital signs reviewed. HENT: Normocephalic.  Atraumatic. Eyes: EOMI. No discharge. Cardiovascular: No JVD.  RRR. Respiratory: Normal effort.  No stridor.  Bilateral clear to auscultation. GI: Non-distended.  BS +. Skin: Warm and dry.  Intact. Psych: Normal mood.  Normal behavior. Musc: No edema in extremities.  No tenderness in extremities. Neuro: Alert Motor: 4/5  throughout Mild expressive aphasia  Assessment/Plan: 1. Functional deficits which require 3+ hours per day of interdisciplinary therapy in a comprehensive inpatient rehab setting. Physiatrist is providing close team supervision and 24 hour management of active medical problems listed below. Physiatrist and rehab team continue to assess barriers to discharge/monitor patient progress toward functional and medical goals   Care Tool:   Bathing          Bathing assist    Upper Body Dressing/Undressing Upper body dressing   Upper body assist   Lower Body Dressing/Undressing Lower body dressing              Lower body assist     Toileting Toileting   Toileting assist Assist for toileting: Minimal Assistance - Patient > 75%    Transfers Chair/bed transfer   Transfers assist        Locomotion Ambulation     Ambulation assist           Walk 10 feet activity     Assist         Walk 50 feet activity     Assist         Walk 150 feet activity     Assist        Walk 10 feet on uneven surface  activity     Assist         Wheelchair         Assist         Wheelchair 50 feet with 2 turns activity       Assist               Wheelchair 150 feet activity        Assist           Medical Problem List and Plan: 1.  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  Impaired mobility -DVT/anticoagulation: Continue Lovenox  Vascular Dopplers personally reviewed, negative for DVT, await final read             -antiplatelet therapy: Aspirin 81 mg daily and Plavix 75 mg daily x3 weeks then Plavix alone 3. Headaches: Topamax for headaches, Decrease oxycodone to 2.5mg  q6H as needed Controlled with meds on 8/13 4. Mood: Provide emotional support             -antipsychotic agents: N/A 5. Neuropsych: This patient is  capable of making decisions on her own behalf. 6. Skin/Wound  Care: Routine skin checks 7. Fluids/Electrolytes/Nutrition: Routine in and outs 8.  Hyperlipidemia: Continue Crestor 9.  Diabetes mellitus.  Hemoglobin A1c 6.2.  Patient on Glucophage 500 mg daily prior to admission.  Resume as needed  Controlled on 8/13              Monitor with increased mobility 10.  Asthma.  Albuterol inhaler as needed. 11. Bilateral lower extremity cramping:  Ultrasound negative Ordered potassium and started mag gluconate 250mg  at night    LOS: 1 days A FACE TO FACE EVALUATION WAS PERFORMED   01/08/2021, 7:48 AM

## 2021-01-10 NOTE — Progress Notes (Signed)
Occupational Therapy Session Note  Patient Details  Name: Amber [name] MRN: [id number] Date of Birth: [birth year]  Today's Date: 01/10/2021 OT Individual Time: 9381-8299 OT Individual Time Calculation (min): 78 min    Short Term Goals: Week 1:  OT Short Term Goal 1 (Week 1): Pt will complete all toilet transfers at supervison level with LRAD. OT Short Term Goal 2 (Week 1): Pt will complete all bathing and dressing with supervision sit to stand. OT Short Term Goal 3 (Week 1): Pt will maintain alternating attention during selfcare tasks with no more than min instructional cueing during ADL tasks.  Skilled Therapeutic Interventions/Progress Updates:    Pt complete bathing, dressing, grooming, and toileting tasks during session.  She was able to complete functional mobility to the bathroom and around the room at supervision level, but still demonstrates guarded posture and slow rate of speed.  Supervision for all aspects of toileting as well as showeing sit to stand.  She completed oral hygiene in standing at supervision as well as all dressing sit to stand from the EOB.  Decreased divided attention to complete tasks while engaged in conversation with therapist.  Finished session gathering up dirty linens from the bed and the floor with close supervision and placing them in the linen bag.  Pt elected to rest sitting in the recliner with the call button and phone in reach and safety belt in place.  No pain or discomfort reported, but pt still with some increased fear of falling, which causes her to move slowly.    Therapy Documentation Precautions:  Precautions Precautions: Fall Restrictions Weight Bearing Restrictions: No   Pain: Pain Assessment Pain Scale: Faces Pain Score: 0-No pain ADL: See Care Tool Section for some details of mobility and selfcare   Therapy/Group: Individual Therapy  Mrk Buzby OTR/L 01/10/2021, 12:50 PM

## 2021-01-10 NOTE — Plan of Care (Signed)
  Problem: RH BOWEL ELIMINATION Goal: RH STG MANAGE BOWEL WITH ASSISTANCE Description: STG Manage Bowel with mod I  Assistance. Outcome: Not Progressing; LBM 8/11; miralax given

## 2021-01-10 NOTE — Progress Notes (Signed)
Occupational Therapy Session Note  Patient Details  Name: Amber [name] MRN: [id number] Date of Birth: [birth year]  Today's Date: 01/11/2021 OT Group Time: 1105-1205 OT Group Time Calculation (min): 60 min  Skilled Therapeutic Interventions/Progress Updates:    Pt engaged in therapeutic w/c level dance group focusing on patient choice, UE/LE strengthening, salience, activity tolerance, and social participation. Pt was guided through various dance-based exercises involving UEs/LEs and trunk. All music was selected by group members. Emphasis placed on NMR and standing balance. Pt exhibited high levels of participation throughout group. Stood with RT during 1 song with Min balance assist. Pt was returned to the room by RT.    Therapy Documentation Precautions:  Precautions Precautions: Fall Restrictions Weight Bearing Restrictions: No  Pain: no s/s pain during tx   ADL: ADL Eating: Set up Where Assessed-Eating: Chair Grooming: Minimal assistance Where Assessed-Grooming: Standing at sink Upper Body Bathing: Minimal assistance Where Assessed-Upper Body Bathing: Shower Lower Body Bathing: Moderate assistance Where Assessed-Lower Body Bathing: Shower Upper Body Dressing: Supervision/safety Where Assessed-Upper Body Dressing: Edge of bed Lower Body Dressing: Minimal assistance Where Assessed-Lower Body Dressing: Edge of bed Where Assessed-Toileting: Bedside Commode Toilet Transfer: Minimal assistance Toilet Transfer Method: Proofreader: Gaffer: Not assessed Film/video editor: Minimal assistance Film/video editor Method: Designer, industrial/product: Other (comment) (3:1)     Therapy/Group: Group Therapy  Amber [name] A Tationa Stech 01/11/2021, 12:46 PM

## 2021-01-10 NOTE — Progress Notes (Signed)
Physical Therapy Session Note  Patient Details  Name: Amber [name] MRN: [id number] Date of Birth: Mar 20, 1971  Today's Date: 01/10/2021 PT Individual Time: 1355-1505 PT Individual Time Calculation (min): 70 min   Short Term Goals: Week 1:  PT Short Term Goal 1 (Week 1): = to LTGs based on ELOS  Skilled Therapeutic Interventions/Progress Updates:    Pt received sitting in recliner and agreeable to therapy session. Pt becomes tearful and reports she has been sad today due to being in the hospital for a week now and pt states she is used to always being productive/busy. Therapist provides emotional support and encouragement. Pt reports her goal is to D/C home no later than Fri 8/19 to be at her daughter's cheerleading event - therapist reinforced this being a very appropriate and achievable goal. Sit<>stands, no AD, with CGA progressing to supervision during session - continues to demo delayed/slow transition into standing. Gait training ~1,069ft to outside without seated rest, no AD, with CGA for steadying - tactile and verbal cuing for increased gait speed as pt continues to have very slow speed of movement - does have reciprocal stepping pattern. Outdoor dynamic gait training, no AD, >1,069ft including over paved brick path, outdoor stairs with only L UE support on HR, and up/down ramps - all with CGA for safety but no unsteadiness noted, demos good R LE foot clearance on ramps, and is able to achieve reciprocal stepping pattern during stair ascent but selects step-to on descent due to increased step depth. 3x seated rest breaks while outside. Therapist educated pt on recommendation for follow-up OPPT to continue addressing higher level dynamic gait and balance with community reintegration - pt in agreement and reports family can drive her. Gait training back to room as described above with pt progressing to close supervision but with fatigue demos compensatory trunk rotation to advance R LE during  swing and decreased R glute activation during stance - cuing for improvement. Pt reports need to use bathroom. Gait into bathroom, no AD, with supervision and managed LB clothing without assist. Pt left seated on toilet with pull cord in hand and pt able to verbalize need to call for assistance - NT aware of pt's position.  Therapy Documentation Precautions:  Precautions Precautions: Fall Restrictions Weight Bearing Restrictions: No   Pain: No reports of pain throughout session.   Therapy/Group: Individual Therapy  Ginny Forth , PT, DPT, NCS, CSRS 01/10/2021, 12:24 PM

## 2021-01-11 DIAGNOSIS — I6300 Cerebral infarction due to thrombosis of unspecified precerebral artery: Secondary | ICD-10-CM

## 2021-01-11 DIAGNOSIS — R252 Cramp and spasm: Secondary | ICD-10-CM

## 2021-01-11 MED ORDER — METHOCARBAMOL 500 MG PO TABS: 500.0000 mg | ORAL_TABLET | Freq: Three times a day (TID) | ORAL | Status: DC | PRN

## 2021-01-11 NOTE — Plan of Care (Signed)
  Problem: Consults Goal: RH STROKE PATIENT EDUCATION Description: See Patient Education module for education specifics  Outcome: Progressing   Problem: RH BOWEL ELIMINATION Goal: RH STG MANAGE BOWEL WITH ASSISTANCE Description: STG Manage Bowel with mod I  Assistance. Outcome: Progressing   Problem: RH SAFETY Goal: RH STG ADHERE TO SAFETY PRECAUTIONS W/ASSISTANCE/DEVICE Description: STG Adhere to Safety Precautions With cues/reminders Assistance/Device. Outcome: Progressing

## 2021-01-11 NOTE — Progress Notes (Signed)
PROGRESS NOTE   Subjective/Complaints: Patient seen sitting up in bed this morning.  She states she slept well overnight.  She is questions regarding results of ultrasound again.  ROS: Denies CP, SOB, N/V/D   Objective:    Imaging Results   CT ANGIO CHEST AORTA W/CM &/OR WO/CM   Result Date: 01/06/2021 CLINICAL DATA:  Aortic abnormality on TEE, cryptogenic stroke EXAM: CT ANGIOGRAPHY CHEST WITH CONTRAST TECHNIQUE: Multidetector CT imaging of the chest was performed using the standard protocol during bolus administration of intravenous contrast. Multiplanar CT image reconstructions and MIPs were obtained to evaluate the vascular anatomy. CONTRAST:  OMNIPAQUE IOHEXOL 350 MG/ML SOLN COMPARISON:  None. FINDINGS: Cardiovascular: Evaluation of the aortic root and ascending aorta is limited by motion artifact on this non-cardiac gated CT angiography of the chest. No gross acute luminal abnormality is seen. Ascending thoracic aortic diameter is within normal limits. No acute luminal abnormality of the imaged aorta. No periaortic stranding or hemorrhage. Shared origin of the brachiocephalic and left common carotid arteries. Proximal great vessels are otherwise unremarkable. Central pulmonary arteries are normal caliber without large central or lobar filling defects on this non tailored examination of the pulmonary arteries. Normal heart size. No pericardial effusion. Mediastinum/Nodes: Fatty stippling in the anterior mediastinum, nonspecific though can reflect thymic remnant absence of traumatic findings other features to suggest a more aggressive process. No mediastinal fluid or gas. Normal thyroid gland and thoracic inlet. No acute abnormality of the trachea or esophagus. No worrisome mediastinal, hilar or axillary adenopathy. Lungs/Pleura: Airways are patent. No consolidation, features of edema, pneumothorax, or effusion. No suspicious pulmonary nodules or masses. Upper Abdomen: 15 mm fluid attenuation  cyst the anterior right lobe liver. No worrisome focal liver lesions. Smooth liver surface contour. Normal hepatic attenuation. Normal gallbladder and biliary tree. Musculoskeletal: No acute osseous abnormality or suspicious osseous lesion. No chest wall mass or suspicious bone lesions identified. Review of the MIP images confirms the above findings. IMPRESSION: Evaluation of the aortic root and ascending aorta is somewhat limited by cardiac pulsation/motion artifact on this nongated CT angiography of the chest. No gross luminal abnormality is seen accounting for this motion artifact. Normal size of the aortic root. No periaortic abnormalities. If there is persisting clinical concern, could repeat imaging with cardiac gating. 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 Electronically Signed   By: Kreg Shropshire M.D.   On: 01/06/2021 19:36    ECHO TEE   Result Date: 01/06/2021    TRANSESOPHOGEAL ECHO REPORT   Patient Name:   Amber [name] Date of Exam: 01/06/2021 Medical Rec #:  [id number]       Height:       62.0 in Accession #:    [id number]      Weight:       179.7 lb Date of Birth:  01/22/1971       BSA:          1.827 m Patient Age:    50 years        BP:           137/95 mmHg Patient Gender: F               HR:           110 bpm. Exam Location:  [facility] Procedure: Transesophageal Echo, Color Doppler, Cardiac Doppler and Saline            Contrast Bubble Study  Indications:     Stroke  History:         Patient has prior history of Echocardiogram examinations, most                  recent 01/04/2021. Risk Factors:Diabetes and Dyslipidemia.                  Asthma; history of recurrent strokes.  Sonographer:     Humphrey Rolls RDCS (AE) Referring Phys:  [id number] Sondra Barges Diagnosing Phys: [name] MD PROCEDURE: After discussion of the risks and benefits of a TEE, an informed consent was obtained from the patient. TEE procedure time  was 26 minutes. The transesophogeal probe was passed without difficulty through the esophogus of the patient. Local oropharyngeal anesthetic was provided with viscous lidocaine. Sedation performed by performing physician. Image quality was adequate. The patient's vital signs; including heart rate, blood pressure, and oxygen saturation; remained stable throughout the procedure. The patient developed no complications during the procedure. IMPRESSIONS  1. Left ventricular ejection fraction, by estimation, is 55 to 60%. The left ventricle has normal function.  2. Right ventricular systolic function is normal. The right ventricular size is normal.  3. No left atrial/left atrial appendage thrombus was detected. The LAA emptying velocity was 76 cm/s.  4. The mitral valve is abnormal. Mild mitral valve regurgitation.  5. The aortic valve is tricuspid. Aortic valve regurgitation is not visualized.  6. There is a linear echodensity in and adjacent to the ascending aorta that most likely represents artifact. However, given cryptogenic strokes, further evaluation with CTA could be helpful to exclude aortic pathology.  7. Agitated saline contrast bubble study was negative, with no evidence of any interatrial shunt. FINDINGS  Left Ventricle: Left ventricular ejection fraction, by estimation, is 55 to 60%. The left ventricle has normal function. Right Ventricle: The right ventricular size is normal. Right ventricular systolic function is normal. Left Atrium: Left atrial size was normal in size. No left atrial/left atrial appendage thrombus was detected. The LAA emptying velocity was 76 cm/s. Right Atrium: Right atrial size was normal in size. Pericardium: There is no evidence of pericardial effusion. Mitral Valve: The mitral valve is abnormal. There is mild thickening of the posterior and anterior mitral valve leaflet(s). Normal mobility of the mitral valve leaflets. Mild mitral valve regurgitation. Tricuspid Valve: The tricuspid  valve is normal in structure. Tricuspid valve regurgitation is not demonstrated. Aortic Valve: The aortic valve is tricuspid. Aortic valve regurgitation is not visualized. Pulmonic Valve: The pulmonic valve was not well visualized. Pulmonic valve regurgitation is not visualized. Aorta: There is a linear echodensity in and adjacent to the ascending aorta that most likely represents artifact. However, given cryptogenic strokes, further evaluation with CTA could be helpful to exclude aortic pathology. The aortic root is normal in size and structure. IAS/Shunts: No atrial level shunt detected by color flow Doppler. Agitated saline contrast was given intravenously to evaluate for intracardiac shunting. Agitated saline contrast bubble study was negative, with no evidence of any interatrial shunt.   AORTA Ao Root diam: 3.30 cm [name] MD Electronically signed by [name] MD Signature Date/Time: 01/06/2021/1:31:49 PM    Final      Recent Labs (last 2 labs)       Recent Labs    01/07/21 0447 01/08/21 0537  WBC 6.8 5.6  HGB 15.4* 15.2*  HCT 47.3* 46.4*  PLT 290 284      Recent Labs (last 2 labs)  Recent Labs    01/08/21 0537  NA 138  K 3.9  CL 103  CO2 26  GLUCOSE 114*  BUN 14  CREATININE 1.02*  CALCIUM 9.7      No intake or output data in the 24 hours ending 01/08/21 0748      Physical Exam: BP 108/75 (BP Location: Left Arm)   Pulse 91   Temp 98.6 F (37 C) (Oral)   Resp 18   Wt 79.2 kg   SpO2 100%   BMI 31.94 kg/m  Constitutional: No distress . Vital signs reviewed. HENT: Normocephalic.  Atraumatic. Eyes: EOMI. No discharge. Cardiovascular: No JVD.  RRR. Respiratory: Normal effort.  No stridor.  Bilateral clear to auscultation. GI: Non-distended.  BS +. Skin: Warm and dry.  Intact. Psych: Normal mood.  Normal behavior. Musc: No edema in extremities.  No tenderness in extremities. Neuro: Alert Motor: 4/5 throughout Mild expressive aphasia,  improving  Assessment/Plan: 1. Functional deficits which require 3+ hours per day of interdisciplinary therapy in a comprehensive inpatient rehab setting. Physiatrist is providing close team supervision and 24 hour management of active medical problems listed below. Physiatrist and rehab team continue to assess barriers to discharge/monitor patient progress toward functional and medical goals   Care Tool:   Bathing          Bathing assist    Upper Body Dressing/Undressing Upper body dressing   Upper body assist   Lower Body Dressing/Undressing Lower body dressing              Lower body assist     Toileting Toileting   Toileting assist Assist for toileting: Minimal Assistance - Patient > 75%    Transfers Chair/bed transfer   Transfers assist        Locomotion Ambulation     Ambulation assist           Walk 10 feet activity     Assist         Walk 50 feet activity     Assist         Walk 150 feet activity     Assist        Walk 10 feet on uneven surface  activity     Assist         Wheelchair         Assist         Wheelchair 50 feet with 2 turns activity       Assist               Wheelchair 150 feet activity        Assist           Medical Problem List and Plan: 1.  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  Impaired mobility -DVT/anticoagulation: Continue Lovenox  Vascular Dopplers personally reviewed, negative for DVT             -antiplatelet therapy: Aspirin 81 mg daily and Plavix 75 mg daily x3 weeks then Plavix alone 3. Headaches: Topamax for headaches, Decrease oxycodone to 2.5mg  q6H as needed Relatively controlled with meds on 8/14 4. Mood: Provide emotional support             -antipsychotic agents: N/A 5. Neuropsych: This patient is capable of making decisions on  her own behalf. 6. Skin/Wound Care: Routine skin checks 7. Fluids/Electrolytes/Nutrition: Routine  in and outs 8.  Hyperlipidemia: Continue Crestor 9.  Diabetes mellitus.  Hemoglobin A1c 6.2.  Patient on Glucophage 500 mg daily prior to admission.  Resume as needed  Controlled on 8/12             Monitor with increased mobility 10.  Asthma.  Albuterol inhaler as needed. 11. Bilateral lower extremity cramping:  Ultrasound negative Robaxin 500 3 times daily as needed started on 8/14 Ordered potassium and started mag gluconate 250mg  at night    LOS: 1 days A FACE TO FACE EVALUATION WAS PERFORMED   01/08/2021, 7:48 AM

## 2021-01-11 NOTE — Progress Notes (Signed)
Physical Therapy Session Note  Patient Details  Name: Amber [name] MRN: [id number] Date of Birth: 10/13/1970  Today's Date: 01/11/2021 PT Individual Time: 4270-6237 PT Individual Time Calculation (min): 60 min   Short Term Goals: Week 1:  PT Short Term Goal 1 (Week 1): = to LTGs based on ELOS  Skilled Therapeutic Interventions/Progress Updates:    Pt presents in bed and discussed therapies yesterday and overall progress as well as emotional state. Emotional support and encouragement provided. Pt performs bed mobility independent with extra time overall for all movements. Pt perform funtional gait in room without AD with close supervision to CGA overall including multiple ambulatory transfers including bed > toilet, > shower > bed > recliner. Pt performs movements and transitions in very "stiff" manner with cues for smoother and more fluid movements. Engaged in showering with pt performing all bathing at distant supervision level, only supervision for standing activities and transfers. Dressing at EOB with overall supervision during standing but mod I when seated including shirt, underwear, pants, socks and shoes. Pt gathering all items throughout session and putting things away. Dropped item on floor and able to reach down with CGA to pick up and encouragement as pt hesitant with fear of falling expressed. Extra time for all language due to aphasia. Pt making excellent progress and very motivated.   Therapy Documentation Precautions:  Precautions Precautions: Fall Restrictions Weight Bearing Restrictions: No  Pain:  Denies pain.    Therapy/Group: Individual Therapy  Karolee Stamps Darrol Poke, PT, DPT, CBIS  01/11/2021, 10:48 AM

## 2021-01-12 LAB — CBC
HCT: 46 % (ref 36.0–46.0)
Hemoglobin: 14.7 g/dL (ref 12.0–15.0)
MCH: 28.3 pg (ref 26.0–34.0)
MCHC: 32 g/dL (ref 30.0–36.0)
MCV: 88.6 fL (ref 80.0–100.0)
Platelets: 298 10*3/uL (ref 150–400)
RBC: 5.19 MIL/uL — ABNORMAL HIGH (ref 3.87–5.11)
RDW: 14 % (ref 11.5–15.5)
WBC: 5.7 10*3/uL (ref 4.0–10.5)
nRBC: 0 % (ref 0.0–0.2)

## 2021-01-12 LAB — BASIC METABOLIC PANEL
Anion gap: 9 (ref 5–15)
BUN: 13 mg/dL (ref 6–20)
CO2: 23 mmol/L (ref 22–32)
Calcium: 9.4 mg/dL (ref 8.9–10.3)
Chloride: 106 mmol/L (ref 98–111)
Creatinine, Ser: 1.09 mg/dL — ABNORMAL HIGH (ref 0.44–1.00)
GFR, Estimated: >60 mL/min (ref >60)
Glucose, Bld: 159 mg/dL — ABNORMAL HIGH (ref 70–99)
Potassium: 4.1 mmol/L (ref 3.5–5.1)
Sodium: 138 mmol/L (ref 135–145)

## 2021-01-12 NOTE — Progress Notes (Signed)
PROGRESS NOTE   Subjective/Complaints: Discussed CBG monitoring at home , she had continuous monitor which was removed prior to MRI last mo Labs reviewed, fasting gluc 159 mildly increased  ROS: Denies CP, SOB, N/V/D   Objective:    Imaging Results   CT ANGIO CHEST AORTA W/CM &/OR WO/CM   Result Date: 01/06/2021 CLINICAL DATA:  Aortic abnormality on TEE, cryptogenic stroke EXAM: CT ANGIOGRAPHY CHEST WITH CONTRAST TECHNIQUE: Multidetector CT imaging of the chest was performed using the standard protocol during bolus administration of intravenous contrast. Multiplanar CT image reconstructions and MIPs were obtained to evaluate the vascular anatomy. CONTRAST:  OMNIPAQUE IOHEXOL 350 MG/ML SOLN COMPARISON:  None. FINDINGS: Cardiovascular: Evaluation of the aortic root and ascending aorta is limited by motion artifact on this non-cardiac gated CT angiography of the chest. No gross acute luminal abnormality is seen. Ascending thoracic aortic diameter is within normal limits. No acute luminal abnormality of the imaged aorta. No periaortic stranding or hemorrhage. Shared origin of the brachiocephalic and left common carotid arteries. Proximal great vessels are otherwise unremarkable. Central pulmonary arteries are normal caliber without large central or lobar filling defects on this non tailored examination of the pulmonary arteries. Normal heart size. No pericardial effusion. Mediastinum/Nodes: Fatty stippling in the anterior mediastinum, nonspecific though can reflect thymic remnant absence of traumatic findings other features to suggest a more aggressive process. No mediastinal fluid or gas. Normal thyroid gland and thoracic inlet. No acute abnormality of the trachea or esophagus. No worrisome mediastinal, hilar or axillary adenopathy. Lungs/Pleura: Airways are patent. No consolidation, features of edema, pneumothorax, or effusion. No suspicious pulmonary nodules or masses. Upper Abdomen: 15 mm fluid  attenuation cyst the anterior right lobe liver. No worrisome focal liver lesions. Smooth liver surface contour. Normal hepatic attenuation. Normal gallbladder and biliary tree. Musculoskeletal: No acute osseous abnormality or suspicious osseous lesion. No chest wall mass or suspicious bone lesions identified. Review of the MIP images confirms the above findings. IMPRESSION: Evaluation of the aortic root and ascending aorta is somewhat limited by cardiac pulsation/motion artifact on this nongated CT angiography of the chest. No gross luminal abnormality is seen accounting for this motion artifact. Normal size of the aortic root. No periaortic abnormalities. If there is persisting clinical concern, could repeat imaging with cardiac gating. 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 Electronically Signed   By: Kreg Shropshire M.D.   On: 01/06/2021 19:36    ECHO TEE   Result Date: 01/06/2021    TRANSESOPHOGEAL ECHO REPORT   Patient Name:   Amber [name] Date of Exam: 01/06/2021 Medical Rec #:  [id number]       Height:       62.0 in Accession #:    [id number]      Weight:       179.7 lb Date of Birth:  [birth year]       BSA:          1.827 m Patient Age:    50 years        BP:           137/95 mmHg Patient Gender: F               HR:           110 bpm. Exam Location:  [facility] Procedure: Transesophageal Echo, Color Doppler, Cardiac Doppler and Saline            Contrast Bubble  Study Indications:     Stroke  History:         Patient has prior history of Echocardiogram examinations, most                  recent 01/04/2021. Risk Factors:Diabetes and Dyslipidemia.                  Asthma; history of recurrent strokes.  Sonographer:     Humphrey Rolls RDCS (AE) Referring Phys:  [id number] Sondra Barges Diagnosing Phys: [name] MD PROCEDURE: After discussion of the risks and benefits of a TEE, an informed consent was obtained from the patient. TEE  procedure time was 26 minutes. The transesophogeal probe was passed without difficulty through the esophogus of the patient. Local oropharyngeal anesthetic was provided with viscous lidocaine. Sedation performed by performing physician. Image quality was adequate. The patient's vital signs; including heart rate, blood pressure, and oxygen saturation; remained stable throughout the procedure. The patient developed no complications during the procedure. IMPRESSIONS  1. Left ventricular ejection fraction, by estimation, is 55 to 60%. The left ventricle has normal function.  2. Right ventricular systolic function is normal. The right ventricular size is normal.  3. No left atrial/left atrial appendage thrombus was detected. The LAA emptying velocity was 76 cm/s.  4. The mitral valve is abnormal. Mild mitral valve regurgitation.  5. The aortic valve is tricuspid. Aortic valve regurgitation is not visualized.  6. There is a linear echodensity in and adjacent to the ascending aorta that most likely represents artifact. However, given cryptogenic strokes, further evaluation with CTA could be helpful to exclude aortic pathology.  7. Agitated saline contrast bubble study was negative, with no evidence of any interatrial shunt. FINDINGS  Left Ventricle: Left ventricular ejection fraction, by estimation, is 55 to 60%. The left ventricle has normal function. Right Ventricle: The right ventricular size is normal. Right ventricular systolic function is normal. Left Atrium: Left atrial size was normal in size. No left atrial/left atrial appendage thrombus was detected. The LAA emptying velocity was 76 cm/s. Right Atrium: Right atrial size was normal in size. Pericardium: There is no evidence of pericardial effusion. Mitral Valve: The mitral valve is abnormal. There is mild thickening of the posterior and anterior mitral valve leaflet(s). Normal mobility of the mitral valve leaflets. Mild mitral valve regurgitation. Tricuspid Valve:  The tricuspid valve is normal in structure. Tricuspid valve regurgitation is not demonstrated. Aortic Valve: The aortic valve is tricuspid. Aortic valve regurgitation is not visualized. Pulmonic Valve: The pulmonic valve was not well visualized. Pulmonic valve regurgitation is not visualized. Aorta: There is a linear echodensity in and adjacent to the ascending aorta that most likely represents artifact. However, given cryptogenic strokes, further evaluation with CTA could be helpful to exclude aortic pathology. The aortic root is normal in size and structure. IAS/Shunts: No atrial level shunt detected by color flow Doppler. Agitated saline contrast was given intravenously to evaluate for intracardiac shunting. Agitated saline contrast bubble study was negative, with no evidence of any interatrial shunt.   AORTA Ao Root diam: 3.30 cm [name] MD Electronically signed by [name] MD Signature Date/Time: 01/06/2021/1:31:49 PM    Final      Recent Labs (last 2 labs)       Recent Labs    01/07/21 0447 01/08/21 0537  WBC 6.8 5.6  HGB 15.4* 15.2*  HCT 47.3* 46.4*  PLT 290 284      Recent Labs (last 2 labs)  Recent Labs    01/08/21 0537  NA 138  K 3.9  CL 103  CO2 26  GLUCOSE 114*  BUN 14  CREATININE 1.02*  CALCIUM 9.7      No intake or output data in the 24 hours ending 01/08/21 0748      Physical Exam: BP (!) 124/92 (BP Location: Left Arm)   Pulse 75   Temp 99.6 F (37.6 C) (Oral)   Resp 18   Ht 5\' 2"  (1.575 m)   Wt 80 kg   SpO2 100%   BMI 32.26 kg/m   General: No acute distress Mood and affect are appropriate Heart: Regular rate and rhythm no rubs murmurs or extra sounds Lungs: Clear to auscultation, breathing unlabored, no rales or wheezes Abdomen: Positive bowel sounds, soft nontender to palpation, nondistended Extremities: No clubbing, cyanosis, or edema Skin: No evidence of breakdown, no evidence of rash Neurologic: Cranial nerves II through XII  intact, motor strength is 5/5 in bilateral deltoid, bicep, tricep, grip, hip flexor, knee extensors, ankle dorsiflexor and plantar flexor Exp aphasia, as well as neurogenic stuttering  Cerebellar exam normal finger to nose to finger as well as heel to shin in bilateral upper and lower extremities Musculoskeletal: Full range of motion in all 4 extremities. No joint swelling   Assessment/Plan: 1. Functional deficits which require 3+ hours per day of interdisciplinary therapy in a comprehensive inpatient rehab setting. Physiatrist is providing close team supervision and 24 hour management of active medical problems listed below. Physiatrist and rehab team continue to assess barriers to discharge/monitor patient progress toward functional and medical goals   Care Tool:   Bathing          Bathing assist    Upper Body Dressing/Undressing Upper body dressing   Upper body assist   Lower Body Dressing/Undressing Lower body dressing              Lower body assist     Toileting Toileting   Toileting assist Assist for toileting: Minimal Assistance - Patient > 75%    Transfers Chair/bed transfer   Transfers assist        Locomotion Ambulation     Ambulation assist           Walk 10 feet activity     Assist         Walk 50 feet activity     Assist         Walk 150 feet activity     Assist        Walk 10 feet on uneven surface  activity     Assist         Wheelchair         Assist         Wheelchair 50 feet with 2 turns activity       Assist               Wheelchair 150 feet activity        Assist           Medical Problem List and Plan: 1.  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  Impaired mobility -DVT/anticoagulation: Continue Lovenox  Vascular Dopplers personally reviewed, negative for  DVT             -antiplatelet therapy: Aspirin 81 mg daily and Plavix 75 mg daily x3 weeks  then Plavix alone 3. Headaches: Topamax for headaches, Decrease oxycodone to 2.5mg  q6H as needed Relatively controlled with meds on 8/14 4. Mood: Provide emotional support             -antipsychotic agents: N/A 5. Neuropsych: This patient is capable of making decisions on her own behalf. 6. Skin/Wound Care: Routine skin checks 7. Fluids/Electrolytes/Nutrition: Routine in and outs 8.  Hyperlipidemia: Continue Crestor 9.  Diabetes mellitus.  Hemoglobin A1c 6.2.  Patient on Glucophage 500 mg daily prior to admission.  Resume as needed  CBG (last 3)  No results for input(s): GLUCAP in the last 72 hours. Will order CBG monitoring  10.  Asthma.  Albuterol inhaler as needed. 11. Bilateral lower extremity cramping:  Ultrasound negative Robaxin 500 3 times daily as needed started on 8/14 Ordered potassium and started mag gluconate 250mg  at night    LOS: 1 days A FACE TO FACE EVALUATION WAS PERFORMED   01/08/2021, 7:48 AM

## 2021-01-12 NOTE — Telephone Encounter (Signed)
Spoke with patients sister about FMLA/Disability forms.  Being that Jae Dire is out on vacation the sister asked I fax forms to Tahsa's caseworker at the hospital for them to fill out the forms.  Sister asked that I prepare the [facility] [employer] paperwork for Jae Dire to sign when she returns.  I faxed the [employer] paperwork to [place] @ [phone].   Disability form semi completed and placed in PCP's inbox for review, completion, sign and date

## 2021-01-12 NOTE — Progress Notes (Signed)
Speech Language Pathology Daily Session Note  Patient Details  Name: Amber [name] MRN: [id number] Date of Birth: 1970/07/05  Today's Date: 01/12/2021 SLP Individual Time: 1400-1500 SLP Individual Time Calculation (min): 60 min  Short Term Goals: Week 1: SLP Short Term Goal 1 (Week 1): Patient will implement speech intelligibility strategies to ahieve 90% intelligibility at conversation level with Mod I SLP Short Term Goal 2 (Week 1): Patient will complete complex problem solving tasks with sup-to-min A verbal cues SLP Short Term Goal 3 (Week 1): Patient will recall novel information with sup-to-min A verbal cues to implement internal/external memory strategies SLP Short Term Goal 4 (Week 1): Patient will complete medication and money management tasks with sup A verbal cues  Skilled Therapeutic Interventions: Patient agreeable to skilled ST intervention with focus on cognitive-linguistic goals. SLP facilitated written expression as patient indicated she had not written anything since her CVA. Patient wrote her personal information and at word and sentence level with 100% spelling accuracy and good legibility. Patient was relieved as she reported feeling nervous that she would be unable to write. SLP facilitated novel card game "blink" with sup A for reasoning and information processing. Facilitated additional problem solving and verbal reasoning through mildly complex word scramble-like activity with sup A for processing. Patient exhibited increased processing speed and efficiency as task progressed during both word scramble and Blink game. Patient continues to implement speech intelligibility strategies and independently repair communication breakdowns which occurred rarely during today's session. Speech was perceived as >90% at conversation level. Patient was handed off to OT at end of session. Continue per current plan of care.      Pain Pain Assessment Pain Scale: 0-10 Pain Score: 0-No  pain  Therapy/Group: Individual Therapy  [name] 01/12/2021, 2:57 PM

## 2021-01-12 NOTE — Progress Notes (Signed)
Occupational Therapy Session Note  Patient Details  Name: Skyy Nilan MRN: [id number] Date of Birth: 09-Aug-1970  Today's Date: 01/12/2021 OT Individual Time: 1300-1330 OT Individual Time Calculation (min): 30 min    Short Term Goals: Week 1:  OT Short Term Goal 1 (Week 1): Pt will complete all toilet transfers at supervison level with LRAD. OT Short Term Goal 2 (Week 1): Pt will complete all bathing and dressing with supervision sit to stand. OT Short Term Goal 3 (Week 1): Pt will maintain alternating attention during selfcare tasks with no more than min instructional cueing during ADL tasks.  Skilled Therapeutic Interventions/Progress Updates:    Pt resting in relciner upon arrival. Pt amb without AD to w/c in room (CGA) and extra time. Pt utilizes self talk to aid in reducing anxiety. Pt transported (for time mgmt) to ortho gym and BITS activities. Pt completed two tasks while standing without support (CGA)-visual scanning and sequencing. Pt completed tasks with no LOB. Pt returned to room and amb to recliner. Pt remained in recliner with all needs within reach and belt alarm activated.   Therapy Documentation Precautions:  Precautions Precautions: Fall Restrictions Weight Bearing Restrictions: No Pain:  Pt denies pain this afternoon   Therapy/Group: Individual Therapy  Rich Brave 01/12/2021, 2:50 PM

## 2021-01-12 NOTE — Progress Notes (Signed)
Patient ID: Amber [name], female   DOB: 28-Nov-1970, 50 y.o.   MRN: [id number] Received fax from [facility] PCP to complete SSD and FMLA forms for MD. PA completed and faxed to [facility] Bishop-[phone]. Fax sent with confirmation. Have given pt the originals back.

## 2021-01-12 NOTE — Progress Notes (Signed)
Physical Therapy Session Note  Patient Details  Name: Amber [name] MRN: [id number] Date of Birth: 10/05/1970  Today's Date: 01/12/2021 PT Group Time: 1100-1200 PT Group Time Calculation (min): 60 min  Short Term Goals: Week 1:  PT Short Term Goal 1 (Week 1): = to LTGs based on ELOS  Skilled Therapeutic Interventions/Progress Updates:    Pt participated in group therapy session with focus on balance training, therapeutic activities, and engaging with fellow patients. Patient with no complaints of pain this date. Pt able to maneuver through obstacle course weaving through cones, stepping over obstacles, and stepping up/down 6" step with use of R HHA. Pt also able to engage in seated and standing with no UE support (with CGA) ball toss with fellow patients, therapist, and rehab tech. Pt taken back to room at end of session by rehab tech.  Therapy Documentation Precautions:  Precautions Precautions: Fall Restrictions Weight Bearing Restrictions: No       Therapy/Group: Group Therapy   Peter Congo, PT, DPT, CSRS  01/12/2021, 12:42 PM

## 2021-01-12 NOTE — Progress Notes (Signed)
Occupational Therapy Session Note  Patient Details  Name: Amber [name] MRN: [id number] Date of Birth: [birth year]  Today's Date: 01/12/2021 OT Individual Time: 7989-2119 OT Individual Time Calculation (min): 32 min    Short Term Goals: Week 1:  OT Short Term Goal 1 (Week 1): Pt will complete all toilet transfers at supervison level with LRAD. OT Short Term Goal 2 (Week 1): Pt will complete all bathing and dressing with supervision sit to stand. OT Short Term Goal 3 (Week 1): Pt will maintain alternating attention during selfcare tasks with no more than min instructional cueing during ADL tasks.  Skilled Therapeutic Interventions/Progress Updates:    Patient finishing speech therapy session seated in w/c - requests to use the bathroom.   Able to ambulate without AD w/c to toilet with CGA.  Toileting completed with CS.  Hand hygiene CS in stance at sink.  Ambulation in room to/from bed, arm chair, recliner with CS/CGA.  Reviewed and practiced upper body coordination activities with good carryover, completed standing weight shift and stepping activities.  Utilized metronome and educated patient on access using her phone with good results and improved timing/pace of activity.  She sat in recliner at close of session, seat alarm set and callbell/tray table in reach.    Therapy Documentation Precautions:  Precautions Precautions: Fall Restrictions Weight Bearing Restrictions: No   Therapy/Group: Individual Therapy  Barrie Lyme 01/12/2021, 7:50 AM

## 2021-01-13 LAB — GLUCOSE, CAPILLARY
Glucose-Capillary: 95 mg/dL (ref 70–99)
Glucose-Capillary: 79 mg/dL (ref 70–99)

## 2021-01-13 NOTE — Progress Notes (Signed)
Occupational Therapy Session Note  Patient Details  Name: Amber [name] MRN: [id number] Date of Birth: [birth year]  Today's Date: 01/13/2021 OT Individual Time: 1004-1100 OT Individual Time Calculation (min): 56 min    Short Term Goals: Week 1:  OT Short Term Goal 1 (Week 1): Pt will complete all toilet transfers at supervison level with LRAD. OT Short Term Goal 2 (Week 1): Pt will complete all bathing and dressing with supervision sit to stand. OT Short Term Goal 3 (Week 1): Pt will maintain alternating attention during selfcare tasks with no more than min instructional cueing during ADL tasks.  Skilled Therapeutic Interventions/Progress Updates:    Session 1: (1004-1100) Pt in bed to start ready to work on shower and dressing.  She was able to transfer to the EOB and then ambulate to the shower with supervision at a very slow rate of speed secondary to decreased divided attention with conversation as well as increased fear of falling.  She was able to complete shower with supervision but again exhibits decreased divided attention when engaged in task along with conversation.  Min questioning cueing to recall the need to wash her buttocks.  She dried off and ambulated out to the EOB for dressing at a slower rate of speed.  All dressing and grooming completed at supervision level sit to stand with oral hygiene completed in standing.  Finished session with ambulation to the recliner with supervision.  Pt was left sitting in the chair with the call button and phone in reach and chair alarm in place.    Session 2: (4098-1191)  Pt worked on toilet transfers with use of the single point cane with supervision.  She completed all toilet hygiene and clothing management with supervision as well as washing hands at the sink.  Had her ambulate down to the tub shower room with supervision for practice to complete tub transfers.  Slight increased difficulty with coordinating stepping along with the cane.  She  was able to complete transfer into the shower with supervision stepping in and out.  Discussed recommendation for a shower seat and different options.  She will purchase from outside vendor.  Next, she ambulated up to the dayroom and then back to the room while engaged in conversation.  She continued to need increased time and cueing to coordinate use of the cane, but is much faster with her gait pattern when using it compared to am sessions.  Finished with return to the room and transfer to the recliner.  Call button and phone in reach with safety alarm pad in place.    Therapy Documentation Precautions:  Precautions Precautions: Fall Restrictions Weight Bearing Restrictions: No   Pain: Pain Assessment Pain Scale: Faces Pain Score: 0-No pain ADL: See Care Tool Section for some details of mobility and selfcare   Therapy/Group: Individual Therapy  Manveer Gomes OTR/L 01/13/2021, 11:00 AM

## 2021-01-13 NOTE — Progress Notes (Signed)
Speech Language Pathology Daily Session Note  Patient Details  Name: Amber [name] MRN: [id number] Date of Birth: [birth year]  Today's Date: 01/13/2021 SLP Individual Time: 8099-8338 SLP Individual Time Calculation (min): 35 min and Today's Date: 01/13/2021 SLP Missed Time: 15 Minutes Missed Time Reason: Other (Comment) (Scheduling conflict)  Short Term Goals: Week 1: SLP Short Term Goal 1 (Week 1): Patient will implement speech intelligibility strategies to ahieve 90% intelligibility at conversation level with Mod I SLP Short Term Goal 2 (Week 1): Patient will complete complex problem solving tasks with sup-to-min A verbal cues SLP Short Term Goal 3 (Week 1): Patient will recall novel information with sup-to-min A verbal cues to implement internal/external memory strategies SLP Short Term Goal 4 (Week 1): Patient will complete medication and money management tasks with sup A verbal cues  Skilled Therapeutic Interventions: Patient agreeable to skilled ST intervention with focus on cognitive goals. Provided education on stroke and cognitive-communication deficits per patient's request. Provided patient with handout to read aloud. Patient was initially hesitant as she reported she has not read anything structured since her CVA. Patient was agreeable and verbally read with only occasional support for pronunciation. She read with reduced pace which was consistent with motor speech rate with mild hesitation. Facilitated counting change task with mod I-to-sup A verbal cues for accuracy. Similar to verbal reading, this task was also executed with a rather slow pace although this did not appear to impede accuracy. There was only one occasion in which patient unknowingly added change twice which resulted in incorrect amount. After this error, patient exhibited improved self monitoring skills during subsequent opportunities. Patient was left in bed with alarm activated and immediate needs within reach at end  of session. Continue per current plan of care.      Pain Pain Assessment Pain Scale: Faces Pain Score: 0-No pain  Therapy/Group: Individual Therapy  [name] 01/13/2021, 12:27 PM

## 2021-01-13 NOTE — Progress Notes (Signed)
Physical Therapy Session Note  Patient Details  Name: Amber [name] MRN: [id number] Date of Birth: 1970/07/04  Today's Date: 01/13/2021 PT Individual Time: 1105-1203 PT Individual Time Calculation (min): 58 min   Short Term Goals: Week 1:  PT Short Term Goal 1 (Week 1): = to LTGs based on ELOS  Skilled Therapeutic Interventions/Progress Updates:    Pt received sitting in recliner from OT session and agreeable to this therapy session. Pt expressing her concerns regarding D/C stating she is experiencing "anxiety" with mobility and fear of falling. Therapist provided pt with education on her CLOF, intact sensation, WFL B LE strength, and score on Berg Balance Test all contributing to safe mobility with decreased fall risk. Pt aware and verbalized that she needs increased confidence in her mobility. Therapist reinforced previous education on recommendation for OPPT to continue advancing higher level gait training with increased community engagement - pt in agreement with this plan.   Session focused on dynamic gait training with increased speed of movement  - provided goal of ambulating to therapy gym, no AD, in no more than 1 minute - pt achieved goal 3x with supervision for safety and therapist providing a visual target to "pace" pt's walking speed - cuing for increased arm swing as pt demos very rigid/guarded trunk posturing with no arm swing.   Stair navigation ascending/descending 12 steps using B HRs with supervision via reciprocal stepping pattern - no instability noted.   Provided pt with metronome to ambulate forward at 100bpm then decreased metronome speed to 70bpm to perform side stepping and then backcwards ambulation. Pt continues to demonstrate increased anxiety with these tasks but with increased exposure demos slowly increasing confidence.  At 70bpm metronome speed performed varying combinations of side stepping, forwards, and backwards gait with therapist providing visual  demonstration of sequencing. Discussed pt performing coordinated dances with her mother and daughter at home (example: Cupid Shuffle) that requires stepping to a certain beat and pt reports she would enjoy this. Pt had no LOB during these dynamic gait challenges and pt able to maintain the speed of the metronome.  Discussed use of SPC to provide pt with some stability and increased confidence during gait. Therapist demonstrated proper use of AD. Gait training ~150ft using SPC with supervision and pt intermittently stepping out of sequence with AD but after ~2-3steps moved back into sequencing. Noticed with AD pt has more relaxed trunk posturing and increased arm swing on L side. Recommended pt using SPC at D/C especially for community ambulation.  Pt completed all ambulation/stair navigation tasks without seated rest break - intermittent standing rest breaks taken.   At end of session therapist provided pt with pen and encouraged her to write in her notebook about her experience during the session including her fears and then the outcome of performing the tasks she was fearful of.  Therapy Documentation Precautions:  Precautions Precautions: Fall Restrictions Weight Bearing Restrictions: No   Pain:  Denies pain during session.   Therapy/Group: Individual Therapy  Ginny Forth , PT, DPT, NCS, CSRS 01/13/2021, 8:01 AM

## 2021-01-14 LAB — GLUCOSE, CAPILLARY: Glucose-Capillary: 99 mg/dL (ref 70–99)

## 2021-01-14 MED ORDER — ACETAMINOPHEN 325 MG PO TABS: 325.0000 mg | ORAL_TABLET | ORAL | Status: DC | PRN

## 2021-01-14 MED ORDER — METHOCARBAMOL 500 MG PO TABS: 500.0000 mg | ORAL_TABLET | Freq: Three times a day (TID) | ORAL | 0 refills | Status: DC | PRN

## 2021-01-14 MED ORDER — CLOPIDOGREL BISULFATE 75 MG PO TABS: 75.0000 mg | ORAL_TABLET | Freq: Every day | ORAL | 0 refills | Status: DC

## 2021-01-14 MED ORDER — ALBUTEROL SULFATE HFA 108 (90 BASE) MCG/ACT IN AERS: 2.0000 | INHALATION_SPRAY | RESPIRATORY_TRACT | 1 refills | Status: DC | PRN

## 2021-01-14 MED ORDER — OXYCODONE HCL 5 MG PO TABS: 2.5000 mg | ORAL_TABLET | Freq: Four times a day (QID) | ORAL | 0 refills | Status: DC | PRN

## 2021-01-14 MED ORDER — ROSUVASTATIN CALCIUM 40 MG PO TABS: 40.0000 mg | ORAL_TABLET | Freq: Every day | ORAL | 3 refills | Status: DC

## 2021-01-14 MED ORDER — VITAMIN D 25 MCG (1000 UNIT) PO TABS: 1000.0000 [IU] | ORAL_TABLET | Freq: Every day | ORAL | 0 refills | Status: AC

## 2021-01-14 MED ORDER — VITAMIN C 1000 MG PO TABS: 1000.0000 mg | ORAL_TABLET | Freq: Every day | ORAL | 0 refills | Status: AC

## 2021-01-14 MED ORDER — MAGNESIUM GLUCONATE 500 MG PO TABS: 250.0000 mg | ORAL_TABLET | Freq: Every day | ORAL | 0 refills | Status: DC

## 2021-01-14 MED ORDER — TOPIRAMATE 25 MG PO TABS: 25.0000 mg | ORAL_TABLET | Freq: Every day | ORAL | 0 refills | Status: DC

## 2021-01-14 NOTE — Progress Notes (Signed)
Occupational Therapy Discharge Summary  Patient Details  Name: Amber [name] MRN: [id number] Date of Birth: 1970/07/07  Today's Date: 01/14/2021 OT Individual Time: 1445-1534 OT Individual Time Calculation (min): 49 min   Session Note:  Focused start of session on functional mobility with use of the single point cane at modified independent level down to the ADL apartment.  Gave pt verbal lists of 4 items to be located in the Pantry at different times throughout session to recall and find while engaged in completion of tub/shower transfers as well home management skills.  On both occasions, she was able to recall 3/4 items before needing min instructional cueing.  Discussed compensation strategies such as making a list to help recall items.  She was able to make up the bed in the apartment with a single sheet at modified independent level as well as complete step in shower/tub transfer with modified independence.  She has someone purchasing a shower seat for her with recommendation for use of a 3:1 as well for sit to stand from the lower toilet.  She was able to complete 5 min interval in standing with the BITs on cognitive memory task.  She was able to recall up to seven word sequence and press them in the correct order.  Finished session with ambulation back to the room and pt working on recalling the 8 items she had to remember from the pantry.  She was able to recall 5 of the 8 without cueing.  Call button and phone in reach with safety alarm in place.    Patient has met 14 of 14 long term goals due to improved balance, postural control, ability to compensate for deficits, functional use of  RIGHT upper, RIGHT lower, LEFT upper, and LEFT lower extremity, improved attention, improved awareness, and improved coordination.  Patient to discharge at overall Modified Independent level.  Patient's care partner is independent to provide the necessary physical and cognitive assistance at discharge.     Reasons goals not met: NA  Recommendation:  Patient will benefit from ongoing skilled OT services in outpatient setting to continue to advance functional skills in the area of BADL, iADL, and Vocation. Feel pt will benefit from outpatient OT eval and treat to continue working on higher level problem solving, memory, and attention with completion of ADLs and I ADLs so that she can return to an independent level at home as well as returning to work.   Equipment: 3:1  Reasons for discharge: treatment goals met and discharge from hospital  Patient/family agrees with progress made and goals achieved: Yes  OT Discharge Precautions/Restrictions   None  Pain Pain Assessment Pain Scale: 0-10 Pain Score: 0-No pain ADL ADL Eating: Independent Where Assessed-Eating: Chair Grooming: Independent Where Assessed-Grooming: Standing at sink Upper Body Bathing: Modified independent Where Assessed-Upper Body Bathing: Multimedia programmer, Chair Lower Body Bathing: Modified independent Where Assessed-Lower Body Bathing: Shower, Chair Upper Body Dressing: Independent Where Assessed-Upper Body Dressing: Edge of bed Lower Body Dressing: Independent Where Assessed-Lower Body Dressing: Edge of bed Toileting: Independent Where Assessed-Toileting: Bedside Commode Toilet Transfer: Modified independent Armed forces technical officer Method: Counselling psychologist: Engineer, technical sales Transfer: Modified independent Tub/Shower Transfer Method: Optometrist: Shower seat without back Social research officer, government: Modified independent Social research officer, government Method: Heritage manager: Other (comment) (3:1) Vision Baseline Vision/History: Wears glasses Wears Glasses: At all times Patient Visual Report: Blurring of vision Vision Assessment?: Yes Eye Alignment: Within Functional Limits Ocular Range of Motion: Within Functional Limits Alignment/Gaze Preference:  Within Defined  Limits Tracking/Visual Pursuits: Able to track stimulus in all quads without difficulty Saccades: Within functional limits Convergence: Within functional limits Visual Fields: No apparent deficits Perception  Perception: Within Functional Limits Praxis Praxis: Intact Cognition Overall Cognitive Status: Impaired/Different from baseline Arousal/Alertness: Awake/alert Attention: Focused;Sustained;Selective;Alternating Focused Attention: Appears intact Sustained Attention: Appears intact Selective Attention: Appears intact Alternating Attention: Impaired Memory: Impaired Memory Impairment: Decreased recall of new information Awareness: Appears intact Problem Solving: Impaired Problem Solving Impairment: Verbal complex;Functional complex Organizing: Impaired Organizing Impairment: Functional complex;Verbal complex Safety/Judgment: Appears intact Sensation Sensation Light Touch: Appears Intact Hot/Cold: Appears Intact Proprioception: Appears Intact Stereognosis: Appears Intact Coordination Gross Motor Movements are Fluid and Coordinated: Yes Fine Motor Movements are Fluid and Coordinated: No Coordination and Movement Description: Decreased speed bilaterally with finger to nose, but uses functionally at a independent level. Motor  Motor Motor: Within Functional Limits Motor - Discharge Observations: Generalized weakness with slower moveements during mobility compared to normal. Mobility  Bed Mobility Bed Mobility: Sit to Supine;Supine to Sit Supine to Sit: Independent Sit to Supine: Independent Transfers Sit to Stand: Independent Stand to Sit: Independent  Trunk/Postural Assessment  Cervical Assessment Cervical Assessment: Within Functional Limits Thoracic Assessment Thoracic Assessment: Within Functional Limits Lumbar Assessment Lumbar Assessment: Within Functional Limits  Balance Balance Balance Assessed: Yes Static Sitting Balance Static Sitting - Balance  Support: Feet supported Static Sitting - Level of Assistance: 7: Independent Dynamic Sitting Balance Dynamic Sitting - Balance Support: During functional activity Dynamic Sitting - Level of Assistance: 7: Independent Static Standing Balance Static Standing - Balance Support: During functional activity Static Standing - Level of Assistance: 7: Independent Dynamic Standing Balance Dynamic Standing - Balance Support: During functional activity Dynamic Standing - Level of Assistance: 6: Modified independent (Device/Increase time) Extremity/Trunk Assessment RUE Assessment RUE Assessment: Within Functional Limits Active Range of Motion (AROM) Comments: WFLS General Strength Comments: 4+/5 throughout.  Pt with slower gross motor movements noted at times but uses functionally at independent level LUE Assessment LUE Assessment: Within Functional Limits Active Range of Motion (AROM) Comments: WFLs General Strength Comments: 4+/5 throughout.  Pt with slower gross motor movements but uses functionally at an independent level.   Kyrsten Deleeuw OTR/L 01/14/2021, 4:50 PM

## 2021-01-14 NOTE — Progress Notes (Signed)
Patient ID: Amber [name], female   DOB: 05/23/1971, 50 y.o.   MRN: [id number]  SW covering for primary SW Christina Bask[place].   SW went by pt room to provide updates from team conference, but pt not in room.   SW met with pt in room to discuss d/c home tomorrow. Pt called her sister Tamara while SW in room to review d/c. Discussed outpatient preference. Prefers to remain in [facility] system. DME recs: SPC, and 3in1 BSC. SW to send outpatient referral to [facility] for outpatient PT/OT/SLP.   SW ordered DME: SPC and 3in1 BSC with [employer] [facility] via parachute.   Auria Chamberlain, MSW, LCSWA Office: [phone] Cell: [phone] Fax: [phone]  

## 2021-01-14 NOTE — Progress Notes (Signed)
Occupational Therapy Note  Patient Details  Name: Amber [name] MRN: [id number] Date of Birth: 05/31/1971  Today's Date: 01/14/2021 OT Missed Time: 60 Minutes Missed Time Reason: Unavailable (comment);Other (comment) (pt reports having meeting with SW, unable to attend group)  Pt missed 60 mins of group session as pt reports needing to meet with SW. Will check back as time allows to make up missed minutes.    Pollyann [facility] 01/14/2021, 3:38 PM

## 2021-01-14 NOTE — Progress Notes (Signed)
Pt bed/chair alarm not set per PT request, we consulted and feel she is independent and needs to gain the confidence for her independence, pt discharging tomorrow (8/18),  stated to pt if feel uncomfortable to hit her call bell whenever needed, both PT (Carly Pippin) and I feel comfortable with pts independence level. This is also helpful to the pt since she has urgency when needing to go to the bathroom so she is able to make it to the bathroom in time.   Loleta Dicker LPN

## 2021-01-14 NOTE — Plan of Care (Signed)
  Problem: RH Expression Communication Goal: LTG Patient will increase speech intelligibility (SLP) Description: LTG: Patient will increase speech intelligibility at word/phrase/conversation level with cues, % of the time (SLP) Outcome: Completed/Met   Problem: RH Problem Solving Goal: LTG Patient will demonstrate problem solving for (SLP) Description: LTG:  Patient will demonstrate problem solving for basic/complex daily situations with cues  (SLP) Outcome: Adequate for Discharge Note: Min A at discharge   Problem: RH Memory Goal: LTG Patient will demonstrate ability for day to day (SLP) Description: LTG:   Patient will demonstrate ability for day to day recall/carryover during cognitive/linguistic activities with assist  (SLP) Outcome: Completed/Met

## 2021-01-14 NOTE — Progress Notes (Signed)
Speech Language Pathology Daily Session Note  Patient Details  Name: Amber [name] MRN: [id number] Date of Birth: January 23, 1971  Today's Date: 01/14/2021 SLP Individual Time: 0830-0920 SLP Individual Time Calculation (min): 50 min  Short Term Goals: Week 1: SLP Short Term Goal 1 (Week 1): Patient will implement speech intelligibility strategies to ahieve 90% intelligibility at conversation level with Mod I SLP Short Term Goal 2 (Week 1): Patient will complete complex problem solving tasks with sup-to-min A verbal cues SLP Short Term Goal 3 (Week 1): Patient will recall novel information with sup-to-min A verbal cues to implement internal/external memory strategies SLP Short Term Goal 4 (Week 1): Patient will complete medication and money management tasks with sup A verbal cues  Skilled Therapeutic Interventions: Patient agreeable to skilled ST intervention with focus on cognitive goals. Re-administered the [facility] Mental Status (SLUMS) with score 25/30 suggestive of significant gains as compared to 16/30 on 01/08/21. Patient continues to exhibit deficits in information processing, working memory, and short-term recall. Extended processing time is required for most cognitive tasks and higher level problem solving. Patient very pleased with her progress. Discussed recommendations for continued SLP services as outpatient at discharge. Patient in agreement. Patient was left in bed with alarm activated and immediate needs within reach at end of session. Continue per current plan of care.      Pain Pain Assessment Pain Scale: 0-10 Pain Score: 0-No pain  Therapy/Group: Individual Therapy  [name] 01/14/2021, 9:16 AM

## 2021-01-14 NOTE — Progress Notes (Signed)
Speech Language Pathology Discharge Summary  Patient Details  Name: Amber [name] MRN: [id number] Date of Birth: [birth year]  Today's Date: 01/14/2021 SLP Individual Time: 50 minutes  Skilled Therapeutic Interventions: Patient agreeable to skilled ST intervention with focus on cognitive goals. Re-administered the [place] Status (SLUMS) with score 25/30 suggestive of significant gains as compared to 16/30 on 01/08/21. Patient continues to exhibit mild deficits in information processing, working memory, and short-term recall. Extended processing time is required for most cognitive tasks and higher level problem solving. Patient very pleased with her progress. Discussed recommendations for continued SLP services as outpatient at discharge. Patient in agreement. Patient was left in bed with alarm activated and immediate needs within reach at end of session.   Patient has met 2 of 3 long term goals.  Patient to discharge at overall Supervision level.  Reasons goals not met: Patient progressing toward complex problem solving goal however currently requires at least min A verbal/visual cues and additional processing time. Adequate for discharge.   Clinical Impression/Discharge Summary: Patient has made excellent gains and has met 2 of 3 long-term goals this admission due to improved motor speech function and cognitive-linguistic function which is further evidenced by significant improvement on SLUMS (25/30) as compared to 16/30 on initial eval. Patient is currently an overall sup A for basic cognitive tasks and requires min A verbal/visual cues and extended processing time for most higher level structured cognitive tasks. She implements speech intelligibility strategies with mod I by pausing between words and over articulation to achieve >90% speech intelligibility at conversation level and independently repairs speech breakdowns. Patient education is complete and patient will discharge  home with her daughter who will be able to provide physical and cognitive assist. Patient would benefit from follow up SLP services to maximize speech and cognitive-linguistic and functional independence.    Care Partner:  Caregiver Able to Provide Assistance: Yes  Type of Caregiver Assistance: Physical;Cognitive  Recommendation:  Outpatient SLP  Rationale for SLP Follow Up: Maximize cognitive function and independence;Maximize functional communication   Equipment: NA   Reasons for discharge: Treatment goals met   Patient/Family Agrees with Progress Made and Goals Achieved: Yes    Suhaila Troiano T Amal Saiki 01/14/2021, 3:19 PM

## 2021-01-14 NOTE — Progress Notes (Signed)
Physical Therapy Discharge Summary  Patient Details  Name: Amber [name] MRN: [id number] Date of Birth: 1970-07-24  Today's Date: 01/14/2021 PT Individual Time: 2542-7062 PT Individual Time Calculation (min): 53 min    Patient has met 10 of 10 long term goals due to improved activity tolerance, improved balance, improved postural control, increased strength, and improved coordination.  Patient to discharge at an ambulatory level Modified Independent using SPC.  Patient's care partner is independent to provide the necessary physical and cognitive assistance at discharge.  All goals met.  Recommendation:  Patient will benefit from ongoing skilled PT services in outpatient setting to continue to advance safe functional mobility, address ongoing impairments in higher level dynamic balance and gait training, community reintegration, and minimize fall risk.  Equipment: SPC  Reasons for discharge: treatment goals met and discharge from hospital  Patient/family agrees with progress made and goals achieved: Yes  Skilled Therapeutic Interventions/Progress Updates:  Pt received sitting in recliner and reports she is having a better day today and is feeling more confident now that she has D/C date of tomorrow. Pt agreeable to therapy session. Sit<>stands mod-I using SPC or independent without AD. Gait training ~167f to main therapy gym [facility] more robotic movements with increased stance time on R LE as opposed to the symmetrical gait pattern pt was performing without AD therefore therapist took away AD and educated pt on returning to a symmetrical gait pattern and ensuring when using SPC she is not modifying gait mechanics - able to complete gait mod-I with SPC or independently without it. Pt demonstrates increasing speed of movements today with increased gait speed (with and without SPC).[facility] as  noted by score of  56/56 compared to the 44/56 demonstrated on 01/09/21. (<36= high risk for falls, close to 100%; 37-45 significant >80%; 46-51 moderate >50%; 52-55 lower >25%). Educated pt on results of test. Participated in stair navigation using R HR only to replicate home set-up - performed independently using reciprocal stepping pattern. Simulated car transfer (sedan height) independently. Gait training ~137fup/down ramp independently. Gait training back to room, no AD, with pt continuing to require cuing to return to a fluid, symmetrical gait pattern instead of the segmented gait pattern pt [facility] continues to provide a "pace" speed when ambulating to encourage increased speed of movement. At end of session pt left seated in recliner with needs in reach.   PT Discharge Precautions/Restrictions Precautions Precautions: None Restrictions Weight Bearing Restrictions: No Pain Pain Assessment Pain Scale: 0-10 Pain Score: 0-No pain Perception  Perception Perception: Within Functional Limits Praxis Praxis: Intact  Cognition Overall Cognitive Status: Impaired/Different from baseline Arousal/Alertness: Awake/alert Orientation Level: Oriented X4 Attention: Focused;Sustained;Selective Focused Attention: Appears intact Sustained Attention: Appears intact Selective Attention: Appears intact Awareness: Appears intact Safety/Judgment: Appears intact Sensation Sensation Light Touch: Appears Intact Hot/Cold: Not tested Proprioception: Appears Intact Stereognosis: Not tested Coordination Gross Motor Movements are Fluid and Coordinated: Yes Motor  Motor Motor: Within Functional Limits Motor - Discharge Observations: Continues to demo slower, more deliberate movements during mobility but [facility] Mob[facility] Bed Mobility: Sit to Supine;Supine to Sit Supine to Sit: Independent Sit to Supine: Independent Transfers Transfers: Sit to Stand;Stand to  Sit;Stand Pivot Transfers Sit to Stand: Independent Stand to Sit: Independent Stand Pivot Transfers: Independent Transfer (Assistive device): None (or SPC) Locomotion  Gait Ambulation: Yes Gait Assistance: Independent with assistive device;Independent Gait Distance (  Feet): 200 Feet Assistive device: None;Straight cane Gait Assistance Details: Verbal cues for gait pattern Gait Gait: Yes Gait Pattern: Within Functional Limits Gait velocity: improved but still decreased compared to normative data Stairs / Additional Locomotion Stairs: Yes Stairs Assistance: Independent with assistive device Stair Management Technique: One rail Right Number of Stairs: 12 Height of Stairs: 6 Ramp: Independent Curb: Independent Wheelchair Mobility Wheelchair Mobility: No  Trunk/Postural Assessment  Cervical Assessment Cervical Assessment: Within Functional Limits Thoracic Assessment Thoracic Assessment: Within Functional Limits Lumbar Assessment Lumbar Assessment: Within Functional Limits Postural Control Postural Control: Within Functional Limits  Balance Standardized Balance Assessment Standardized Balance Assessment: Berg Balance Test Berg Balance Test Sit to Stand: Able to stand without using hands and stabilize independently Standing Unsupported: Able to stand safely 2 minutes Sitting with Back Unsupported but Feet Supported on Floor or Stool: Able to sit safely and securely 2 minutes Stand to Sit: Sits safely with minimal use of hands Transfers: Able to transfer safely, minor use of hands Standing Unsupported with Eyes Closed: Able to stand 10 seconds safely Standing Ubsupported with Feet Together: Able to place feet together independently and stand 1 minute safely From Standing, Reach Forward with Outstretched Arm: Can reach confidently >25 cm (10") From Standing Position, Pick up Object from Floor: Able to pick up shoe safely and easily From Standing Position, Turn to Look Behind  Over each Shoulder: Looks behind from both sides and weight shifts well Turn 360 Degrees: Able to turn 360 degrees safely in 4 seconds or less Standing Unsupported, Alternately Place Feet on Step/Stool: Able to stand independently and safely and complete 8 steps in 20 seconds Standing Unsupported, One Foot in Front: Able to place foot tandem independently and hold 30 seconds Standing on One Leg: Able to lift leg independently and hold > 10 seconds Total Score: 56 Static Sitting Balance Static Sitting - Balance Support: Feet supported Static Sitting - Level of Assistance: 7: Independent Dynamic Sitting Balance Dynamic Sitting - Balance Support: During functional activity Dynamic Sitting - Level of Assistance: 7: Independent Static Standing Balance Static Standing - Balance Support: During functional activity Static Standing - Level of Assistance: 7: Independent Dynamic Standing Balance Dynamic Standing - Balance Support: During functional activity Dynamic Standing - Level of Assistance: 6: Modified independent (Device/Increase time) Extremity Assessment      RLE Assessment RLE Assessment: Within Functional Limits RLE Strength Right Hip Flexion: 5/5 Right Knee Flexion: 5/5 Right Knee Extension: 5/5 Right Ankle Dorsiflexion: 5/5 Right Ankle Plantar Flexion: 5/5 LLE Assessment LLE Assessment: Within Functional Limits LLE Strength Left Hip Flexion: 5/5 Left Knee Flexion: 5/5 Left Knee Extension: 5/5 Left Ankle Dorsiflexion: 5/5 Left Ankle Plantar Flexion: 5/5    Kimarie Coor M Girard Koontz , PT, DPT, NCS, CSRS 01/14/2021, 10:35 AM

## 2021-01-14 NOTE — Plan of Care (Signed)
  Problem: RH Balance Goal: LTG Patient will maintain dynamic sitting balance (PT) Description: LTG:  Patient will maintain dynamic sitting balance with assistance during mobility activities (PT) Outcome: Completed/Met Goal: LTG Patient will maintain dynamic standing balance (PT) Description: LTG:  Patient will maintain dynamic standing balance with assistance during mobility activities (PT) Outcome: Completed/Met   Problem: Sit to Stand Goal: LTG:  Patient will perform sit to stand with assistance level (PT) Description: LTG:  Patient will perform sit to stand with assistance level (PT) Outcome: Completed/Met   Problem: RH Bed Mobility Goal: LTG Patient will perform bed mobility with assist (PT) Description: LTG: Patient will perform bed mobility with assistance, with/without cues (PT). Outcome: Completed/Met   Problem: RH Bed to Chair Transfers Goal: LTG Patient will perform bed/chair transfers w/assist (PT) Description: LTG: Patient will perform bed to chair transfers with assistance (PT). Outcome: Completed/Met   Problem: RH Car Transfers Goal: LTG Patient will perform car transfers with assist (PT) Description: LTG: Patient will perform car transfers with assistance (PT). Outcome: Completed/Met   Problem: RH Furniture Transfers Goal: LTG Patient will perform furniture transfers w/assist (OT/PT) Description: LTG: Patient will perform furniture transfers  with assistance (OT/PT). Outcome: Completed/Met   Problem: RH Ambulation Goal: LTG Patient will ambulate in controlled environment (PT) Description: LTG: Patient will ambulate in a controlled environment, # of feet with assistance (PT). Outcome: Completed/Met Goal: LTG Patient will ambulate in home environment (PT) Description: LTG: Patient will ambulate in home environment, # of feet with assistance (PT). Outcome: Completed/Met   Problem: RH Stairs Goal: LTG Patient will ambulate up and down stairs w/assist  (PT) Description: LTG: Patient will ambulate up and down # of stairs with assistance (PT) Outcome: Completed/Met
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
## 2021-01-14 NOTE — Progress Notes (Signed)
PROGRESS NOTE   Subjective/Complaints: Pt wanting to go home soon ROS: Denies CP, SOB, N/V/D   Objective:    Imaging Results   CT ANGIO CHEST AORTA W/CM &/OR WO/CM   Result Date: 01/06/2021 CLINICAL DATA:  Aortic abnormality on TEE, cryptogenic stroke EXAM: CT ANGIOGRAPHY CHEST WITH CONTRAST TECHNIQUE: Multidetector CT imaging of the chest was performed using the standard protocol during bolus administration of intravenous contrast. Multiplanar CT image reconstructions and MIPs were obtained to evaluate the vascular anatomy. CONTRAST:  185m OMNIPAQUE IOHEXOL 350 MG/ML SOLN COMPARISON:  None. FINDINGS: Cardiovascular: Evaluation of the aortic root and ascending aorta is limited by motion artifact on this non-cardiac gated CT angiography of the chest. No gross acute luminal abnormality is seen. Ascending thoracic aortic diameter is within normal limits. No acute luminal abnormality of the imaged aorta. No periaortic stranding or hemorrhage. Shared origin of the brachiocephalic and left common carotid arteries. Proximal great vessels are otherwise unremarkable. Central pulmonary arteries are normal caliber without large central or lobar filling defects on this non tailored examination of the pulmonary arteries. Normal heart size. No pericardial effusion. Mediastinum/Nodes: Fatty stippling in the anterior mediastinum, nonspecific though can reflect thymic remnant absence of traumatic findings other features to suggest a more aggressive process. No mediastinal fluid or gas. Normal thyroid gland and thoracic inlet. No acute abnormality of the trachea or esophagus. No worrisome mediastinal, hilar or axillary adenopathy. Lungs/Pleura: Airways are patent. No consolidation, features of edema, pneumothorax, or effusion. No suspicious pulmonary nodules or masses. Upper Abdomen: 15 mm fluid attenuation cyst the anterior right lobe liver. No worrisome focal liver lesions. Smooth liver surface contour. Normal hepatic  attenuation. Normal gallbladder and biliary tree. Musculoskeletal: No acute osseous abnormality or suspicious osseous lesion. No chest wall mass or suspicious bone lesions identified. Review of the MIP images confirms the above findings. IMPRESSION: Evaluation of the aortic root and ascending aorta is somewhat limited by cardiac pulsation/motion artifact on this nongated CT angiography of the chest. No gross luminal abnormality is seen accounting for this motion artifact. Normal size of the aortic root. No periaortic abnormalities. If there is persisting clinical concern, could repeat imaging with cardiac gating. 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 Electronically Signed   By: PLovena LeM.D.   On: 01/06/2021 19:36    ECHO TEE   Result Date: 01/06/2021    TRANSESOPHOGEAL ECHO REPORT   Patient Name:   Amber MALACHIDate of Exam: 01/06/2021 Medical Rec #:  [id number]      Height:       62.0 in Accession #:    [id number]     Weight:       179.7 lb Date of Birth:  4July 03, 1972      BSA:          1.827 m Patient Age:    89years        BP:           137/95 mmHg Patient Gender: F               HR:           110 bpm. Exam Location:  [facility] Procedure: Transesophageal Echo, Color Doppler, Cardiac Doppler and Saline            Contrast Bubble Study Indications:     Stroke  History:         Patient has prior  history of Echocardiogram examinations, most                  recent 01/04/2021. Risk Factors:Diabetes and Dyslipidemia.                  Asthma; history of recurrent strokes.  Sonographer:     [place] (AE) Referring Phys:  [id number] Rise Mu Diagnosing Phys: Nelva Bush MD PROCEDURE: After discussion of the risks and benefits of a TEE, an informed consent was obtained from the patient. TEE procedure time was 26 minutes. The transesophogeal probe was passed without difficulty through the esophogus of the patient.  Local oropharyngeal anesthetic was provided with viscous lidocaine. Sedation performed by performing physician. Image quality was adequate. The patient's vital signs; including heart rate, blood pressure, and oxygen saturation; remained stable throughout the procedure. The patient developed no complications during the procedure. IMPRESSIONS  1. Left ventricular ejection fraction, by estimation, is 55 to 60%. The left ventricle has normal function.  2. Right ventricular systolic function is normal. The right ventricular size is normal.  3. No left atrial/left atrial appendage thrombus was detected. The LAA emptying velocity was 76 cm/s.  4. The mitral valve is abnormal. Mild mitral valve regurgitation.  5. The aortic valve is tricuspid. Aortic valve regurgitation is not visualized.  6. There is a linear echodensity in and adjacent to the ascending aorta that most likely represents artifact. However, given cryptogenic strokes, further evaluation with CTA could be helpful to exclude aortic pathology.  7. Agitated saline contrast bubble study was negative, with no evidence of any interatrial shunt. FINDINGS  Left Ventricle: Left ventricular ejection fraction, by estimation, is 55 to 60%. The left ventricle has normal function. Right Ventricle: The right ventricular size is normal. Right ventricular systolic function is normal. Left Atrium: Left atrial size was normal in size. No left atrial/left atrial appendage thrombus was detected. The LAA emptying velocity was 76 cm/s. Right Atrium: Right atrial size was normal in size. Pericardium: There is no evidence of pericardial effusion. Mitral Valve: The mitral valve is abnormal. There is mild thickening of the posterior and anterior mitral valve leaflet(s). Normal mobility of the mitral valve leaflets. Mild mitral valve regurgitation. Tricuspid Valve: The tricuspid valve is normal in structure. Tricuspid valve regurgitation is not demonstrated. Aortic Valve: The aortic  valve is tricuspid. Aortic valve regurgitation is not visualized. Pulmonic Valve: The pulmonic valve was not well visualized. Pulmonic valve regurgitation is not visualized. Aorta: There is a linear echodensity in and adjacent to the ascending aorta that most likely represents artifact. However, given cryptogenic strokes, further evaluation with CTA could be helpful to exclude aortic pathology. The aortic root is normal in size and structure. IAS/Shunts: No atrial level shunt detected by color flow Doppler. Agitated saline contrast was given intravenously to evaluate for intracardiac shunting. Agitated saline contrast bubble study was negative, with no evidence of any interatrial shunt.   AORTA Ao Root diam: 3.30 cm Nelva Bush MD Electronically signed by Nelva Bush MD Signature Date/Time: 01/06/2021/1:31:49 PM    Final      Recent Labs (last 2 labs)       Recent Labs    01/07/21 0447 01/08/21 0537  WBC 6.8 5.6  HGB 15.4* 15.2*  HCT 47.3* 46.4*  PLT 290 284      Recent Labs (last 2 labs)      Recent Labs    01/08/21 0537  NA 138  K 3.9  CL 103  CO2 26  GLUCOSE 114*  BUN 14  CREATININE 1.02*  CALCIUM 9.7      No intake or output data in the 24 hours ending 01/08/21 0748      Physical Exam: BP 101/74 (BP Location: Right Arm)   Pulse 75   Temp 97.6 F (36.4 C) (Oral)   Resp 16   Ht '5\' 2"'  (1.575 m)   Wt 80.8 kg   SpO2 94%   BMI 32.58 kg/m    General: No acute distress Mood and affect are appropriate Heart: Regular rate and rhythm no rubs murmurs or extra sounds Lungs: Clear to auscultation, breathing unlabored, no rales or wheezes Abdomen: Positive bowel sounds, soft nontender to palpation, nondistended Extremities: No clubbing, cyanosis, or edema Skin: No evidence of breakdown, no evidence of rash Neurologic: Cranial nerves II through XII intact, motor strength is 5/5 in bilateral deltoid, bicep, tricep, grip, hip flexor, knee extensors, ankle dorsiflexor and  plantar flexor Sensory exam normal sensation to light touch and proprioception in bilateral upper and lower extremities  Exp aphasia, as well as neurogenic stuttering  Cerebellar exam normal finger to nose to finger as well as heel to shin in bilateral upper and lower extremities Musculoskeletal: Full range of motion in all 4 extremities. No joint swelling   Assessment/Plan: 1. Functional deficits which require 3+ hours per day of interdisciplinary therapy in a comprehensive inpatient rehab setting. Physiatrist is providing close team supervision and 24 hour management of active medical problems listed below. Physiatrist and rehab team continue to assess barriers to discharge/monitor patient progress toward functional and medical goals   Care Tool:   Bathing          Bathing assist    Upper Body Dressing/Undressing Upper body dressing   Upper body assist   Lower Body Dressing/Undressing Lower body dressing              Lower body assist     Toileting Toileting   Toileting assist Assist for toileting: Minimal Assistance - Patient > 75%    Transfers Chair/bed transfer   Transfers assist        Locomotion Ambulation     Ambulation assist           Walk 10 feet activity     Assist         Walk 50 feet activity     Assist         Walk 150 feet activity     Assist        Walk 10 feet on uneven surface  activity     Assist         Wheelchair         Assist         Wheelchair 50 feet with 2 turns activity       Assist               Wheelchair 150 feet activity        Assist           Medical Problem List and Plan: 1.  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 Formulized individual treatment plan based on medical history, underlying problem and comorbidities.  2.  Impaired mobility -DVT/anticoagulation: Continue Lovenox  Vascular Dopplers negative for DVT             -antiplatelet therapy: Aspirin 81 mg daily and Plavix 75 mg daily x3 weeks then Plavix alone which by dates is 01/23/21 3. Headaches: Topamax for headaches, Decrease oxycodone to 2.23m q6H as needed Relatively controlled with meds on 8/14 4. Mood: Provide emotional support             -antipsychotic agents: N/A 5. Neuropsych: This patient is capable of making decisions on her own behalf. 6. Skin/Wound Care: Routine skin checks 7. Fluids/Electrolytes/Nutrition: Routine in and outs 8.  Hyperlipidemia: Continue Crestor 9.  Diabetes mellitus.  Hemoglobin A1c 6.2.  Patient on Glucophage 500 mg daily prior to admission.  Resume as needed  CBG (last 3)  Recent Labs    01/13/21 0546 01/13/21 1743 01/14/21 0618  GLUCAP 95 79 99   CBG nl  10.  Asthma.  Albuterol inhaler as needed. 11. Bilateral lower extremity cramping:  Ultrasound negative Robaxin 500 3 times daily as needed started on 8/14 Ordered 495m potassium and started mag gluconate 25044mt night    LOS: 1 days A FACE TO FACChurchillKirsteins 01/08/2021, 7:48 AM

## 2021-01-14 NOTE — Patient Care Conference (Signed)
Inpatient RehabilitationTeam Conference and Plan of Care Update Date: 01/14/2021   Time: 10:33 AM    Patient Name: Amber [name]      Medical Record Number: [id number]  Date of Birth: 09/29/1970 Sex: Female         Room/Bed: 4M11C/4M11C-01 Payor Info: Payor: BLUE CROSS BLUE [employer]IELD / Plan: BCBS COMM PPO / Product Type: *No Product type* /    Admit Date/Time:  01/07/2021 12:57 PM  Primary Diagnosis:  Acute CVA (cerebrovascular accident) [facility])  Hospital Problems: Principal Problem:   Acute CVA (cerebrovascular accident) (HCC) Active Problems:   Stroke (cerebrum) (HCC)   Diabetes mellitus type II, controlled (HCC)   Cramp in limb   Muscle cramps    Expected Discharge Date: Expected Discharge Date: 01/15/21  Team Members Present: Physician leading conference: Dr. Claudette Laws Social Worker Present: Cecile Sheerer, LCSWA Nurse Present: Chana Bode, RN PT Present: Casimiro Needle, PT OT Present: Perrin Maltese, OT SLP Present: Eilene Ghazi, SLP PPS Coordinator present : Fae Pippin, SLP     Current Status/Progress Goal Weekly Team Focus  Bowel/Bladder   Pt is cont of b/B. LBM 01/13/21  Pt remain cont of B/B  Toilet pt as needed   Swallow/Nutrition/ Hydration             ADL's   Supervision for all bathing,dressing, toileting without use of an assistive device.  Moves slower than normal with mobility secondary to increased fear of falling.  modified independent overall  selfcare retraining, transfer training, DME education, balance retraining, pt education   Mobility   independent bed mobility, supervision sit<>stand and stand pivot transfers without AD, gait >259ft without AD with CGA and then using SPC with supervision (AD provides pt confidence resulting in increased gait speed and improved trunk rotation and arm swing), supervision 12 steps using HRs - pt continues to demo very slow, guarded movements with mobility due to increased fear of falling but is improving   mod-I overall  activity tolerance, dynamic standing balance, dynamic gait training, pt education, DME education, D/C planning, stair navigation   Communication   mod I - patient progressing well!  mod I  Self monitoring and repair of speech errors.   Safety/Cognition/ Behavioral Observations  sup-to-min A for complex cognitive tasks, benefits from increased processing time and exhibits good carry over  sup A  complex problem solving, med/money management   Pain   Pt denies pain  Pt remains pain free  Assess for pain qshift and provide PRN methods as needed   Skin   Pt skin is clean and intact  Pt skin remains free of breakdown  Assess skin qshift and provide skin care as needed     Discharge Planning:  D/c to home with support from mother and her dtr who will come in to assist her.   Team Discussion: Patient post stroke with speech and balance issues. Patient has slow rigid movements and is very cautious, and hesitant with movements and a neurogenic stuttering presentation Patient on target to meet rehab goals: yes, currently supervision for ADLs, toileting and transfers. Requires supervision overall for cognition and min assist for more complex processing Goals for discharge set at mod I level.  *See Care Plan and progress notes for long and short-term goals.   Revisions to Treatment Plan:  Working on divided attention short term recall and working [employer] fast streamer.  Teaching Needs: [place] training, self monitoring for speech, and error awareness  Current Barriers to Discharge: Home enviroment access/layout  Possible Resolutions  to Barriers: Family education with mother and sister and daughter OP follow up services (PT, OT and SLP)     Medical Summary Current Status: BPs and CBGs controlled , occ HA     Possible Resolutions to [employer] Focus: may need additional med management for HA   Continued Need for Acute Rehabilitation Level of Care: The patient requires daily medical  management by a physician with specialized training in physical medicine and rehabilitation for the following reasons: Direction of a multidisciplinary physical rehabilitation program to maximize functional independence : Yes Medical management of patient stability for increased activity during participation in an intensive rehabilitation regime.: Yes Analysis of laboratory values and/or radiology reports with any subsequent need for medication adjustment and/or medical intervention. : Yes   I attest that I was present, lead the team conference, and concur with the assessment and plan of the team.   Chana Bode B 01/14/2021, 11:48 AM

## 2021-01-15 LAB — GLUCOSE, CAPILLARY: Glucose-Capillary: 93 mg/dL (ref 70–99)

## 2021-01-15 NOTE — Progress Notes (Signed)
PROGRESS NOTE   Subjective/Complaints:  ROS: Denies CP, SOB, N/V/D   Objective:    Imaging Results   CT ANGIO CHEST AORTA W/CM &/OR WO/CM   Result Date: 01/06/2021 CLINICAL DATA:  Aortic abnormality on TEE, cryptogenic stroke EXAM: CT ANGIOGRAPHY CHEST WITH CONTRAST TECHNIQUE: Multidetector CT imaging of the chest was performed using the standard protocol during bolus administration of intravenous contrast. Multiplanar CT image reconstructions and MIPs were obtained to evaluate the vascular anatomy. CONTRAST:  OMNIPAQUE IOHEXOL 350 MG/ML SOLN COMPARISON:  None. FINDINGS: Cardiovascular: Evaluation of the aortic root and ascending aorta is limited by motion artifact on this non-cardiac gated CT angiography of the chest. No gross acute luminal abnormality is seen. Ascending thoracic aortic diameter is within normal limits. No acute luminal abnormality of the imaged aorta. No periaortic stranding or hemorrhage. Shared origin of the brachiocephalic and left common carotid arteries. Proximal great vessels are otherwise unremarkable. Central pulmonary arteries are normal caliber without large central or lobar filling defects on this non tailored examination of the pulmonary arteries. Normal heart size. No pericardial effusion. Mediastinum/Nodes: Fatty stippling in the anterior mediastinum, nonspecific though can reflect thymic remnant absence of traumatic findings other features to suggest a more aggressive process. No mediastinal fluid or gas. Normal thyroid gland and thoracic inlet. No acute abnormality of the trachea or esophagus. No worrisome mediastinal, hilar or axillary adenopathy. Lungs/Pleura: Airways are patent. No consolidation, features of edema, pneumothorax, or effusion. No suspicious pulmonary nodules or masses. Upper Abdomen: 15 mm fluid attenuation cyst the anterior right lobe liver. No worrisome focal liver lesions. Smooth liver surface contour. Normal hepatic attenuation. Normal  gallbladder and biliary tree. Musculoskeletal: No acute osseous abnormality or suspicious osseous lesion. No chest wall mass or suspicious bone lesions identified. Review of the MIP images confirms the above findings. IMPRESSION: Evaluation of the aortic root and ascending aorta is somewhat limited by cardiac pulsation/motion artifact on this nongated CT angiography of the chest. No gross luminal abnormality is seen accounting for this motion artifact. Normal size of the aortic root. No periaortic abnormalities. If there is persisting clinical concern, could repeat imaging with cardiac gating. 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 Electronically Signed   By: Kreg Shropshire M.D.   On: 01/06/2021 19:36    ECHO TEE   Result Date: 01/06/2021    TRANSESOPHOGEAL ECHO REPORT   Patient Name:   Amber [name] Date of Exam: 01/06/2021 Medical Rec #:  [id number]       Height:       62.0 in Accession #:    [id number]      Weight:       179.7 lb Date of Birth:  16-Apr-1971       BSA:          1.827 m Patient Age:    50 years        BP:           137/95 mmHg Patient Gender: F               HR:           110 bpm. Exam Location:  [facility] Procedure: Transesophageal Echo, Color Doppler, Cardiac Doppler and Saline            Contrast Bubble Study Indications:     Stroke  History:         Patient has prior history of Echocardiogram examinations, most  recent 01/04/2021. Risk Factors:Diabetes and Dyslipidemia.                  Asthma; history of recurrent strokes.  Sonographer:     Humphrey Rolls RDCS (AE) Referring Phys:  [id number] Sondra Barges Diagnosing Phys: [name] MD PROCEDURE: After discussion of the risks and benefits of a TEE, an informed consent was obtained from the patient. TEE procedure time was 26 minutes. The transesophogeal probe was passed without difficulty through the esophogus of the patient. Local oropharyngeal  anesthetic was provided with viscous lidocaine. Sedation performed by performing physician. Image quality was adequate. The patient's vital signs; including heart rate, blood pressure, and oxygen saturation; remained stable throughout the procedure. The patient developed no complications during the procedure. IMPRESSIONS  1. Left ventricular ejection fraction, by estimation, is 55 to 60%. The left ventricle has normal function.  2. Right ventricular systolic function is normal. The right ventricular size is normal.  3. No left atrial/left atrial appendage thrombus was detected. The LAA emptying velocity was 76 cm/s.  4. The mitral valve is abnormal. Mild mitral valve regurgitation.  5. The aortic valve is tricuspid. Aortic valve regurgitation is not visualized.  6. There is a linear echodensity in and adjacent to the ascending aorta that most likely represents artifact. However, given cryptogenic strokes, further evaluation with CTA could be helpful to exclude aortic pathology.  7. Agitated saline contrast bubble study was negative, with no evidence of any interatrial shunt. FINDINGS  Left Ventricle: Left ventricular ejection fraction, by estimation, is 55 to 60%. The left ventricle has normal function. Right Ventricle: The right ventricular size is normal. Right ventricular systolic function is normal. Left Atrium: Left atrial size was normal in size. No left atrial/left atrial appendage thrombus was detected. The LAA emptying velocity was 76 cm/s. Right Atrium: Right atrial size was normal in size. Pericardium: There is no evidence of pericardial effusion. Mitral Valve: The mitral valve is abnormal. There is mild thickening of the posterior and anterior mitral valve leaflet(s). Normal mobility of the mitral valve leaflets. Mild mitral valve regurgitation. Tricuspid Valve: The tricuspid valve is normal in structure. Tricuspid valve regurgitation is not demonstrated. Aortic Valve: The aortic valve is tricuspid.  Aortic valve regurgitation is not visualized. Pulmonic Valve: The pulmonic valve was not well visualized. Pulmonic valve regurgitation is not visualized. Aorta: There is a linear echodensity in and adjacent to the ascending aorta that most likely represents artifact. However, given cryptogenic strokes, further evaluation with CTA could be helpful to exclude aortic pathology. The aortic root is normal in size and structure. IAS/Shunts: No atrial level shunt detected by color flow Doppler. Agitated saline contrast was given intravenously to evaluate for intracardiac shunting. Agitated saline contrast bubble study was negative, with no evidence of any interatrial shunt.   AORTA Ao Root diam: 3.30 cm [name] MD Electronically signed by [name] MD Signature Date/Time: 01/06/2021/1:31:49 PM    Final      Recent Labs (last 2 labs)       Recent Labs    01/07/21 0447 01/08/21 0537  WBC 6.8 5.6  HGB 15.4* 15.2*  HCT 47.3* 46.4*  PLT 290 284      Recent Labs (last 2 labs)      Recent Labs    01/08/21 0537  NA 138  K 3.9  CL 103  CO2 26  GLUCOSE 114*  BUN 14  CREATININE 1.02*  CALCIUM 9.7      No intake  or output data in the 24 hours ending 01/08/21 0748      Physical Exam: BP 91/64 (BP Location: Left Arm)   Pulse 91   Temp 97.7 F (36.5 C) (Oral)   Resp 20   Ht 5\' 2"  (1.575 m)   Wt 78.3 kg   SpO2 99%   BMI 31.57 kg/m   General: No acute distress Mood and affect are appropriate Heart: Regular rate and rhythm no rubs murmurs or extra sounds Lungs: Clear to auscultation, breathing unlabored, no rales or wheezes Abdomen: Positive bowel sounds, soft nontender to palpation, nondistended Extremities: No clubbing, cyanosis, or edema Skin: No evidence of breakdown, no evidence of rash    Exp aphasia, as well as neurogenic stuttering  Cerebellar exam normal finger to nose to finger as well as heel to shin in bilateral upper and lower extremities Musculoskeletal: Full  range of motion in all 4 extremities. No joint swelling   Assessment/Plan: 1. Functional deficits  Stable for D/C today F/u PCP in 3-4 weeks F/u PM&R 2 weeks See D/C summary See D/C instructions    Care Tool:   Bathing          Bathing assist    Upper Body Dressing/Undressing Upper body dressing   Upper body assist   Lower Body Dressing/Undressing Lower body dressing              Lower body assist     Toileting Toileting   Toileting assist Assist for toileting: Minimal Assistance - Patient > 75%    Transfers Chair/bed transfer   Transfers assist        Locomotion Ambulation     Ambulation assist           Walk 10 feet activity     Assist         Walk 50 feet activity     Assist         Walk 150 feet activity     Assist        Walk 10 feet on uneven surface  activity     Assist         Wheelchair         Assist         Wheelchair 50 feet with 2 turns activity       Assist               Wheelchair 150 feet activity        Assist           Medical Problem List and Plan: 1.  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  Impaired mobility -DVT/anticoagulation: Continue Lovenox  Vascular Dopplers negative for DVT             -antiplatelet therapy: Aspirin 81 mg daily and Plavix 75 mg daily x3 weeks then Plavix alone which by dates is 01/23/21 3. Headaches: Topamax for headaches, Decrease oxycodone to 2.5mg  q6H as needed Relatively controlled with meds on 8/14 4. Mood: Provide emotional support             -antipsychotic agents: N/A 5. Neuropsych: This patient is capable of making decisions on her own behalf. 6. Skin/Wound Care: Routine skin checks 7. Fluids/Electrolytes/Nutrition: Routine in and outs 8.  Hyperlipidemia: Continue Crestor 9.  Diabetes mellitus.  Hemoglobin A1c  6.2.  Patient on Glucophage 500  mg daily prior to admission.  Resume as needed  CBG (last 3)  Recent Labs    01/13/21 1743 01/14/21 0618 01/15/21 0607  GLUCAP 79 99 93    CBG nl  10.  Asthma.  Albuterol inhaler as needed. 11. Bilateral lower extremity cramping:  Ultrasound negative Robaxin 500 3 times daily as needed started on 8/14 Ordered potassium and started mag gluconate 250mg  at night    LOS: 1 days A FACE TO FACE EVALUATION WAS PERFORMED   01/08/2021, 7:48 AM

## 2021-01-15 NOTE — Progress Notes (Signed)
Inpatient Rehabilitation Care Coordinator Discharge Note  The overall goal for the admission was met for:   Discharge location: Yes. D/c to home with support from family.  Length of Stay: Yes. 7 days.  Discharge activity level: Yes.Mod I with SPC with ambulation to Supervision for  Home/community participation: Yes  Services provided included: MD, RD, PT, OT, SLP, RN, CM, TR, Pharmacy, Neuropsych, and SW  Financial Services: Private Insurance: [employer] offered to/list presented to: Yes  Follow-up services arranged: Outpatient: [facility] for outpatient PT/OT/SLP and DME: [place] for 3in1 [facility] and transport chair; family to purchase [facility]  Comments (or additional information):  Patient/Family verbalized understanding of follow-up arrangements: Yes  Individual responsible for coordination of the follow-up plan: contact pt sister Tamara#[phone]  Confirmed correct DME delivered: Rana Snare 01/15/2021    Rana Snare

## 2021-01-15 NOTE — Progress Notes (Signed)
Patient ID: Amber [name], female   DOB: [birth year], 50 y.o.   MRN: [id number]  SW called pt in room to inform on her DME being delivered this morning by [employer] and to not leave without it.  SW faxed outpatient PT/OT/SLP referral to [facility]  SW spoke with Christina/[employer] that 3-in-1 BSC will be delivered, however out of stock on [facility].  SW called pt sister Delaney Meigs to provide updates on above about DME. Requested a w/c. SW suggested transport chair for distances since this was not an item recommended. SW ordered with [employer].Reports she will ask her mother to purchase a cane on their way home today.   SW called pt in room to inform to discuss above. Requested new advanced care directive phone since she messed up.  *SW brought by new living will.   Cecile Sheerer, MSW, LCSWA Office: [phone] Cell: [phone] Fax: [phone]

## 2021-01-16 ENCOUNTER — Telehealth: Payer: Self-pay

## 2021-01-16 NOTE — Telephone Encounter (Signed)
Transition Care Management Unsuccessful Follow-up Telephone Call  Date of discharge and from where:  01/15/2021-[facility]   Attempts:  1st Attempt  Reason for unsuccessful TCM follow-up call:  Left voice message

## 2021-01-17 NOTE — Telephone Encounter (Signed)
Noted and received paperwork, will need to coordinate with Dr. Wynn Banker and [name], NP to find out how long patient is recommended to be out on disability from her recent CVA.  Dr. Wynn Banker and Riley Lam, can you provide an estimation for how long this patient will need to remain on disability from work? Her starting date was 01/02/21. You saw her during her recent hospital stay, looks like follow up is scheduled for 02/03/21 with [name], NP. I appreciate any information you can provide. Thanks!   Tamera, please update patient and her sister than I'm consulting with Dr. Wynn Banker regarding her forms. Will get these completed as soon as I hear back.

## 2021-01-19 NOTE — Telephone Encounter (Signed)
Transition Care Management Unsuccessful Follow-up Telephone Call  Date of discharge and from where:  01/15/2021-[facility]  Attempts:  2nd Attempt  Reason for unsuccessful TCM follow-up call:  Left voice message

## 2021-01-20 NOTE — Telephone Encounter (Signed)
*  PT had appt  8/26 already in the am- let pat/family decide which is better for them am or pm *

## 2021-01-20 NOTE — Telephone Encounter (Signed)
Amber [name], thank you, but unfortunately she will not see the neurologist until October 2022 and she has paperwork to fill out now.   Amber [name], please see responses from physical medicine and notify patient and her sister of their recommendation. She will see physical medicine on 02/03/21.  Amber [name], please have patient scheduled with me for hospital follow up this week. Okay to add on Friday 08/26 at 3:20 pm.

## 2021-01-20 NOTE — Telephone Encounter (Signed)
Thanks, Riley Lam. I am hopeful we can get an estimate prior to his vacation. I appreciate you both!

## 2021-01-20 NOTE — Telephone Encounter (Signed)
Transition Care Management Follow-up Telephone Call Date of discharge and from where: 01/15/21 [facility] How have you been since you were released from the hospital? Pt stated that she is feeling well considering recent events. Pt stated that she has rehab and was not sure it has been scheduled yet. Informed patient that her rehab. Pt stated that she is having some low blood sugar readings. Pt stated that the readings have been in the upper 60's. I spoke to pt mother and informed that patient may need to reach out to see what recommendations PCP may have. Pt and mother both stated understanding.  Any questions or concerns? No  Items Reviewed: Did the pt receive and understand the discharge instructions provided? Yes  Medications obtained and verified? Yes  Other? No  Any new allergies since your discharge? No  Dietary orders reviewed? No Do you have support at home? Yes   Functional Questionnaire: (I = Independent and D = Dependent) ADLs: I  Bathing/Dressing- I  Meal Prep- I  Eating- I  Maintaining continence- I  Transferring/Ambulation- I  Managing Meds- I   Follow up appointments reviewed:  PCP Hospital f/u appt confirmed? No   Specialist Hospital f/u appt confirmed? Yes  [name], NP on 02/03/21 at 2:00pm Are transportation arrangements needed? No  If their condition worsens, is the pt aware to call PCP or go to the Emergency Dept.? Yes Was the patient provided with contact information for the PCP's office or ED? Yes Was to pt encouraged to call back with questions or concerns? Yes

## 2021-01-20 NOTE — Telephone Encounter (Signed)
LVM w sister Jodene Nam that we made an appt for Amber [name] this Friday 8/26 @3 :20pm with the option to do virtual.  Informed them that we were unable to get an end date on the disability paperwork from the Drs at the hospital so we are going to end date it for the date of her neurologist appt and they should take over the paperwork from there. Asked that pts sister call to confirm

## 2021-01-22 ENCOUNTER — Other Ambulatory Visit: Payer: Self-pay

## 2021-01-22 ENCOUNTER — Ambulatory Visit (HOSPITAL_COMMUNITY)
Admission: RE | Admit: 2021-01-22 | Discharge: 2021-01-22 | Disposition: A | Discharge status: Home / Self Care | Payer: BC Managed Care – PPO | Source: Ambulatory Visit | Attending: Primary Care | Admitting: Primary Care

## 2021-01-22 DIAGNOSIS — Z8673 Personal history of transient ischemic attack (TIA), and cerebral infarction without residual deficits: Secondary | ICD-10-CM | POA: Insufficient documentation

## 2021-01-23 ENCOUNTER — Encounter: Payer: Self-pay | Admitting: Primary Care

## 2021-01-23 ENCOUNTER — Ambulatory Visit (INDEPENDENT_AMBULATORY_CARE_PROVIDER_SITE_OTHER): Payer: BC Managed Care – PPO | Admitting: Primary Care

## 2021-01-23 ENCOUNTER — Other Ambulatory Visit: Payer: Self-pay

## 2021-01-23 ENCOUNTER — Ambulatory Visit: Payer: BC Managed Care – PPO | Admitting: Primary Care

## 2021-01-23 DIAGNOSIS — R4701 Aphasia: Secondary | ICD-10-CM

## 2021-01-23 DIAGNOSIS — I10 Essential (primary) hypertension: Secondary | ICD-10-CM

## 2021-01-23 DIAGNOSIS — I639 Cerebral infarction, unspecified: Secondary | ICD-10-CM

## 2021-01-23 NOTE — Patient Instructions (Signed)
It was a pleasure to see you today!   

## 2021-01-23 NOTE — Assessment & Plan Note (Signed)
Patient has been taking HCTZ 12.5 mg for the last few weeks, denies dizziness.  It doesn't appear that HCTZ was given during hospital stay. Will continue HCTZ for now. She will monitor BP and notify if readings are consistently below 100/60.

## 2021-01-23 NOTE — Telephone Encounter (Signed)
Spoke with sister.  Faxing completed forms and making copies for pt pick up to fill in their part of paperwork   Copies for pick up  Copy for scan   Copy retained by me

## 2021-01-23 NOTE — Assessment & Plan Note (Signed)
Apparent today, secondary to acute stroke,  improving gradually per patient. Proceed with speech therapy as scheduled.

## 2021-01-23 NOTE — Assessment & Plan Note (Signed)
Recent hospitalization for left sided stroke.  Interestingly MRI from July with prior strokes but no acute strokes, MRI from August with acute strokes.  Reviewed hospital notes, labs, imaging.  Exam today stable.   Continue clopidogrel 75 mg, rosuvastatin 40 mg. She will follow up with neurology and physical medicine as scheduled.   Agree to excuse her from work from 01/02/21 through 03/31/21 with return to work of 04/01/21. Forms completed today.

## 2021-01-23 NOTE — Progress Notes (Signed)
Subjective:    Patient ID: Amber [name], female    DOB: 01/02/1971, 50 y.o.   MRN: [id number]  HPI  Amber [name] is a very pleasant 50 y.o. female with a history of CVA in 2016 and recently, type 2 diabetes, hypertension, hyperlipidemia who presents today for hospital follow up.  She presented to [facility] ED on 01/02/21 by her daughter for sudden onset of difficulty with word finding, slurred speech, right facial droop. Code Stroke initiated, CT head negative. She received TPA in the ED due to ASPECTS score. Admitted for further evaluation.   She underwent MRI brain with acute infarcts to left middle frontal gyrus and left parietal gyrus. CTA head/neck negative. Patient continued with expressive aphasia during her stay. Initiated on aspirin 81 mg and clopidogrel 75 mg x 3 weeks, then plan is to remain on clopidogrel 75 mg alone. TEE completed, negative for thrombus, negative bubble study. She was discharged from hospital services and to inpatient rehab.   During her rehab stay she underwent PT and Speech therapy. She was discharged home on 01/15/21 with recommendations for outpatient Neurology and Physical medicine follow up.  Since her last visit she is compliant to her clopidogrel 75 mg, rosuvastatin 40 mg. She has an appointment scheduled with physical medicine for 02/03/21, and neurology in 03/03/21. Her expressive aphasia has improved since discharge, has not resolved.   She has been out of work since 01/02/21 and is needing paperwork completed. She denies new symptoms of facial drooping, weakness, decline in speech. Her headaches have improved overall and are tolerable. She has noticed visual changes prior to her stroke. Her mother and sister are helping to care for her including transportation and communication at appointments.   She is compliant to her metformin XR 500 mg daily. A1C of 6.2 in August 2022 during hospital stay. She has been taking her HCTZ 12.5 mg for blood pressure. This  was discontinued during a visit in our office in July 2022.  BP Readings from Last 3 Encounters:  01/23/21 108/62  01/15/21 91/64  01/07/21 (!) 132/102      Review of Systems  Eyes:  Negative for visual disturbance.  Respiratory:  Negative for shortness of breath.   Cardiovascular:  Negative for chest pain.  Neurological:  Positive for headaches. Negative for weakness.       Expressive aphasia.   See HPI        Past Medical History:  Diagnosis Date  . Anxiety    self reported  . Asthma   . Depression    controlled  . Diabetes mellitus without complication (HCC)   . Hyperlipidemia    controlled with medication  . IBS (irritable bowel syndrome)   . Stroke (HCC) 03/13/2015  . Tension headache 11/18/2020    Social History   Socioeconomic History  . Marital status: Single    Spouse name: Not on file  . Number of children: Not on file  . Years of education: Not on file  . Highest education level: Not on file  Occupational History  . Not on file  Tobacco Use  . Smoking status: Never  . Smokeless tobacco: Never  Vaping Use  . Vaping Use: Never used  Substance and Sexual Activity  . Alcohol use: Yes    Alcohol/week: 7.0 standard drinks    Types: 7 Glasses of wine per week  . Drug use: Yes    Types: Marijuana    Comment: 2x/month  . Sexual activity: Not Currently  Other  Topics Concern  . Not on file  Social History Narrative   Originally from [place]   Family lives up here in [place]   Has one daughter.   Enjoys spending time shopping and spending time with family    Social Determinants of Health   Financial Resource Strain: Not on file  Food Insecurity: Not on file  Transportation Needs: Not on file  Physical Activity: Not on file  Stress: Not on file  Social Connections: Not on file  Intimate Partner Violence: Not on file    Past Surgical History:  Procedure Laterality Date  . ABDOMINAL HYSTERECTOMY  10/2006  . bowel  reconstruction  10/2006   with hysterectomy  . CESAREAN SECTION  2005  . EP IMPLANTABLE DEVICE N/A 03/17/2015   Procedure: Loop Recorder Insertion;  Surgeon: Will Jorja Loa, MD;  Location: [facility] INVASIVE CV LAB;  Service: Cardiovascular;  Laterality: N/A;  . TEE WITHOUT CARDIOVERSION N/A 03/17/2015   Procedure: TRANSESOPHAGEAL ECHOCARDIOGRAM (TEE);  Surgeon: Chilton Si, MD;  Location: [facility] ENDOSCOPY;  Service: Cardiovascular;  Laterality: N/A;  . TEE WITHOUT CARDIOVERSION N/A 01/06/2021   Procedure: TRANSESOPHAGEAL ECHOCARDIOGRAM (TEE);  Surgeon: [name], MD;  Location: [facility] ORS;  Service: Cardiovascular;  Laterality: N/A;  . TOE SURGERY     Left 2nd metatarsal    Family History  Problem Relation Age of Onset  . Hyperlipidemia Mother   . Arthritis Father   . Diabetes Father   . Hypertension Father   . Hyperlipidemia Father   . Prostate cancer Father   . Heart attack Maternal Grandfather   . Hyperlipidemia Maternal Grandfather   . Prostate cancer Maternal Grandfather   . Diabetes Paternal Grandmother   . Stroke Paternal Grandmother   . Diabetes Paternal Grandfather   . Heart attack Paternal Grandfather   . Prostate cancer Maternal Uncle        x 3  . Colon cancer Neg Hx   . Colon polyps Neg Hx   . Stomach cancer Neg Hx   . Esophageal cancer Neg Hx   . Pancreatic cancer Neg Hx     Allergies  Allergen Reactions  . Keflex [Cephalexin] Rash    Current Outpatient Medications on File Prior to Visit  Medication Sig Dispense Refill  . acetaminophen (TYLENOL) 325 MG tablet Take 1-2 tablets (325-650 mg total) by mouth every 4 (four) hours as needed for mild pain.    Marland Kitchen albuterol (VENTOLIN HFA) 108 (90 Base) MCG/ACT inhaler Inhale 2 puffs into the lungs every 4 (four) hours as needed for wheezing or shortness of breath (cough, shortness of breath or wheezing.). 1 each 1  . Ascorbic Acid (VITAMIN C) 1000 MG tablet Take 1 tablet (1,000 mg total) by mouth daily. 30 tablet 0   . azelastine (ASTELIN) 0.1 % nasal spray Place 1 spray into both nostrils 2 (two) times daily. Use in each nostril as directed 30 mL 12  . cholecalciferol (VITAMIN D3) 25 MCG (1000 UNIT) tablet Take 1 tablet (1,000 Units total) by mouth daily. 30 tablet 0  . clopidogrel (PLAVIX) 75 MG tablet Take 1 tablet (75 mg total) by mouth daily. 30 tablet 0  . metFORMIN (GLUCOPHAGE-XR) 500 MG 24 hr tablet Take 500 mg by mouth daily with breakfast.    . methocarbamol (ROBAXIN) 500 MG tablet Take 1 tablet (500 mg total) by mouth every 8 (eight) hours as needed for muscle spasms. 30 tablet 0  . Multiple Vitamins-Minerals (MULTIVITAMIN WITH MINERALS) tablet Take 1 tablet by  mouth daily.    Marland Kitchen oxyCODONE (OXY IR/ROXICODONE) 5 MG immediate release tablet Take 0.5 tablets (2.5 mg total) by mouth every 6 (six) hours as needed for severe pain. 30 tablet 0  . rosuvastatin (CRESTOR) 40 MG tablet Take 1 tablet (40 mg total) by mouth daily. For cholesterol. 90 tablet 3  . topiramate (TOPAMAX) 25 MG tablet Take 1 tablet (25 mg total) by mouth daily after supper. 30 tablet 0  . aspirin EC 81 MG tablet Take 81 mg by mouth daily. (Patient not taking: Reported on 01/23/2021)    . magnesium gluconate (MAGONATE) 500 MG tablet Take 0.5 tablets (250 mg total) by mouth at bedtime. (Patient not taking: Reported on 01/23/2021) 30 tablet 0   No current facility-administered medications on file prior to visit.    BP 108/62   Pulse 88   Temp 98.6 F (37 C) (Temporal)   Ht 5\' 2"  (1.575 m)   Wt 173 lb (78.5 kg)   SpO2 98%   BMI 31.64 kg/m  Objective:   Physical Exam Cardiovascular:     Rate and Rhythm: Normal rate and regular rhythm.  Pulmonary:     Effort: Pulmonary effort is normal.     Breath sounds: Normal breath sounds.  Musculoskeletal:     Cervical back: Neck supple.  Skin:    General: Skin is warm and dry.  Neurological:     Mental Status: She is alert and oriented to person, place, and time.     Comments: No arm  drift. Hand grips equal bilaterally. Ambulatory without difficulty.   Expressive aphasia noted, but is able to complete most sentences and express most thoughts.   Psychiatric:        Mood and Affect: Mood normal.          Assessment & Plan:      This visit occurred during the SARS-CoV-2 public health emergency.  Safety protocols were in place, including screening questions prior to the visit, additional usage of staff PPE, and extensive cleaning of exam room while observing appropriate contact time as indicated for disinfecting solutions.

## 2021-01-28 ENCOUNTER — Ambulatory Visit: Payer: BC Managed Care – PPO | Attending: Primary Care

## 2021-01-28 ENCOUNTER — Other Ambulatory Visit: Payer: Self-pay

## 2021-01-28 ENCOUNTER — Ambulatory Visit: Payer: BC Managed Care – PPO | Admitting: Speech Pathology

## 2021-01-28 ENCOUNTER — Encounter: Payer: Self-pay | Admitting: Occupational Therapy

## 2021-01-28 ENCOUNTER — Encounter: Payer: Self-pay | Admitting: Speech Pathology

## 2021-01-28 ENCOUNTER — Ambulatory Visit: Payer: BC Managed Care – PPO | Admitting: Occupational Therapy

## 2021-01-28 DIAGNOSIS — R41842 Visuospatial deficit: Secondary | ICD-10-CM | POA: Insufficient documentation

## 2021-01-28 DIAGNOSIS — R41840 Attention and concentration deficit: Secondary | ICD-10-CM | POA: Diagnosis present

## 2021-01-28 DIAGNOSIS — R278 Other lack of coordination: Secondary | ICD-10-CM

## 2021-01-28 DIAGNOSIS — R482 Apraxia: Secondary | ICD-10-CM | POA: Insufficient documentation

## 2021-01-28 DIAGNOSIS — R4701 Aphasia: Secondary | ICD-10-CM

## 2021-01-28 DIAGNOSIS — R41844 Frontal lobe and executive function deficit: Secondary | ICD-10-CM | POA: Diagnosis present

## 2021-01-28 DIAGNOSIS — I69351 Hemiplegia and hemiparesis following cerebral infarction affecting right dominant side: Secondary | ICD-10-CM

## 2021-01-28 DIAGNOSIS — M6281 Muscle weakness (generalized): Secondary | ICD-10-CM | POA: Diagnosis present

## 2021-01-28 DIAGNOSIS — G44221 Chronic tension-type headache, intractable: Secondary | ICD-10-CM | POA: Diagnosis present

## 2021-01-28 DIAGNOSIS — M542 Cervicalgia: Secondary | ICD-10-CM | POA: Diagnosis present

## 2021-01-28 DIAGNOSIS — R2689 Other abnormalities of gait and mobility: Secondary | ICD-10-CM
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
## 2021-01-28 NOTE — Patient Instructions (Signed)
  3 ring binder with sections for PT, OT, Speech  Pill organizer, have supervision when you sort pills, but you do the sorting  Talk Path app  Start keeping calendar or using phone for appointments   Aphasia, Verbal Apraxia  Read aloud articles, books etc 5-10 minutes  twice a day

## 2021-01-30 ENCOUNTER — Telehealth: Payer: Self-pay

## 2021-01-30 DIAGNOSIS — K219 Gastro-esophageal reflux disease without esophagitis: Secondary | ICD-10-CM

## 2021-01-30 DIAGNOSIS — E785 Hyperlipidemia, unspecified: Secondary | ICD-10-CM

## 2021-01-30 DIAGNOSIS — F411 Generalized anxiety disorder: Secondary | ICD-10-CM

## 2021-01-30 NOTE — Telephone Encounter (Signed)
Pt left VM on triage line stating that she needs Jae Dire to give her something for acid reflux. Pt just seen on 01/23/21. No meds on pts list for reflux.

## 2021-01-30 NOTE — Telephone Encounter (Signed)
Please call patient:  What are her symptoms?  What has she tried taking for reflux?  How often is she experiencing acid reflux?

## 2021-01-30 NOTE — Telephone Encounter (Signed)
Do you need to see for appointment?

## 2021-01-31 MED ORDER — FAMOTIDINE 20 MG PO TABS: 20.0000 mg | ORAL_TABLET | Freq: Two times a day (BID) | ORAL | 0 refills | Status: DC

## 2021-02-03 ENCOUNTER — Emergency Department (HOSPITAL_COMMUNITY): Payer: BC Managed Care – PPO

## 2021-02-03 ENCOUNTER — Encounter (HOSPITAL_COMMUNITY): Payer: Self-pay

## 2021-02-03 ENCOUNTER — Encounter: Payer: BC Managed Care – PPO | Attending: Registered Nurse | Admitting: Registered Nurse

## 2021-02-03 ENCOUNTER — Emergency Department (HOSPITAL_COMMUNITY)
Admission: EM | Admit: 2021-02-03 | Discharge: 2021-02-04 | Disposition: A | Discharge status: Home / Self Care | Payer: BC Managed Care – PPO | Attending: Emergency Medicine | Admitting: Emergency Medicine

## 2021-02-03 ENCOUNTER — Encounter: Payer: Self-pay | Admitting: Registered Nurse

## 2021-02-03 ENCOUNTER — Other Ambulatory Visit: Payer: Self-pay

## 2021-02-03 VITALS — BP 114/79 | HR 77 | Temp 98.2°F | Ht 62.0 in | Wt 173.6 lb

## 2021-02-03 DIAGNOSIS — R0789 Other chest pain: Secondary | ICD-10-CM | POA: Diagnosis not present

## 2021-02-03 DIAGNOSIS — M79602 Pain in left arm: Secondary | ICD-10-CM | POA: Diagnosis not present

## 2021-02-03 DIAGNOSIS — Z5321 Procedure and treatment not carried out due to patient leaving prior to being seen by health care provider: Secondary | ICD-10-CM | POA: Diagnosis not present

## 2021-02-03 DIAGNOSIS — N644 Mastodynia: Secondary | ICD-10-CM | POA: Insufficient documentation

## 2021-02-03 DIAGNOSIS — K219 Gastro-esophageal reflux disease without esophagitis: Secondary | ICD-10-CM | POA: Diagnosis not present

## 2021-02-03 DIAGNOSIS — I639 Cerebral infarction, unspecified: Secondary | ICD-10-CM

## 2021-02-03 DIAGNOSIS — E118 Type 2 diabetes mellitus with unspecified complications: Secondary | ICD-10-CM | POA: Diagnosis not present

## 2021-02-03 DIAGNOSIS — R0602 Shortness of breath: Secondary | ICD-10-CM | POA: Insufficient documentation

## 2021-02-03 LAB — CBC WITH DIFFERENTIAL/PLATELET
Abs Immature Granulocytes: 0.01 10*3/uL (ref 0.00–0.07)
Basophils Absolute: 0 10*3/uL (ref 0.0–0.1)
Basophils Relative: 1 %
Eosinophils Absolute: 0.1 10*3/uL (ref 0.0–0.5)
Eosinophils Relative: 2 %
HCT: 46.8 % — ABNORMAL HIGH (ref 36.0–46.0)
Hemoglobin: 14.7 g/dL (ref 12.0–15.0)
Immature Granulocytes: 0 %
Lymphocytes Relative: 33 %
Lymphs Abs: 1.7 10*3/uL (ref 0.7–4.0)
MCH: 28.2 pg (ref 26.0–34.0)
MCHC: 31.4 g/dL (ref 30.0–36.0)
MCV: 89.8 fL (ref 80.0–100.0)
Monocytes Absolute: 0.3 10*3/uL (ref 0.1–1.0)
Monocytes Relative: 7 %
Neutro Abs: 2.9 10*3/uL (ref 1.7–7.7)
Neutrophils Relative %: 57 %
Platelets: 249 10*3/uL (ref 150–400)
RBC: 5.21 MIL/uL — ABNORMAL HIGH (ref 3.87–5.11)
RDW: 13.6 % (ref 11.5–15.5)
WBC: 5 10*3/uL (ref 4.0–10.5)
nRBC: 0 % (ref 0.0–0.2)

## 2021-02-03 LAB — BASIC METABOLIC PANEL
Anion gap: 8 (ref 5–15)
BUN: 13 mg/dL (ref 6–20)
CO2: 25 mmol/L (ref 22–32)
Calcium: 10 mg/dL (ref 8.9–10.3)
Chloride: 105 mmol/L (ref 98–111)
Creatinine, Ser: 1.09 mg/dL — ABNORMAL HIGH (ref 0.44–1.00)
GFR, Estimated: >60 mL/min (ref >60)
Glucose, Bld: 98 mg/dL (ref 70–99)
Potassium: 3.7 mmol/L (ref 3.5–5.1)
Sodium: 138 mmol/L (ref 135–145)

## 2021-02-03 LAB — I-STAT BETA HCG BLOOD, ED (MC, WL, AP ONLY): I-stat hCG, quantitative: <5 m[IU]/mL (ref <5)

## 2021-02-03 LAB — TROPONIN I (HIGH SENSITIVITY): Troponin I (High Sensitivity): <2 ng/L (ref <18)

## 2021-02-03 NOTE — Telephone Encounter (Signed)
See where it was addressed in my chart did call patient and confirm that had been addressed.

## 2021-02-03 NOTE — ED Triage Notes (Signed)
Pt presents with intermittent Left chest tightness under her Left breast, radiating down her Left arm, associated with SOB starting approx 2pm today while at her PCP. Sent here for further evaluation

## 2021-02-03 NOTE — ED Notes (Signed)
Patient states checked her results online and she will call her doctor in the morning and have her give her instructions on what to do

## 2021-02-03 NOTE — Progress Notes (Signed)
Subjective:    Patient ID: Amber [name], female    DOB: [birth year], 50 y.o.   MRN: [id number]  HPI: Amber [name] is a 50 y.o. female who is here for HFU  for the following Acute CVA, Diabetes Mellitus  Type 2, Controlled.  Amber [name] presented to [facility] on 01/02/2021 with acute expressive aphasia and right facial droop. Code Stroke initiated . She received tPA. Neurology Consulted.   CT Head:  IMPRESSION: 1. No evidence of acute large vascular territory infarct or acute hemorrhage. ASPECTS is 10. 2. Remote left cerebellar lacunar infarcts. Remote cortical infarcts better seen on prior MRI.   CT Angio Head and Neck:  IMPRESSION: 1. No evidence of a large vessel occlusion or proximal hemodynamically significant stenosis in the head or neck. 2. No evidence of penumbra or core infarct on perfusion.  MR: Brain:  IMPRESSION: Acute infarcts in the left middle frontal gyrus and left parietal gyrus. Probable emboli. These were not present on the prior MRI of 12/26/2020  Amber [name] was maintained on aspirin and Plavix for CVA prophylaxis x 3 weeks then Plavix alone.   Amber [name] was admitted to inpatient rehabilitation on 01/07/2021 and discharged home on 01/15/2021. She has a schedule appointment with Outpatient Neuro-Rehabilitation on 02/04/2021.   Amber [name] arrived to office  reporting she has been having chest pain and SOB over a week,  she states the other day, the pain was so bad, she was afraid to go to sleep, she didn't seek medical attention. Today she reports her chest pain is radiating into her left breast and left arm .   She also states  she seen her PCP, due to her History of GERD she was prescribed Pepcid.   Amber [name]  states she's having chest pain right now and its radiating into her left breast and left upper extremity. She denies any SOB at this time.  She admits to history of GERD and she has changed her diet regimen and still having acid reflux daily, she was  encouraged to go to the ED for evaluation, she verbalizes understanding. She refused EMS, her mother will take her to [facility] for evaluation.   Amber [name] has expressive aphasia, she is able to answer questions and she reports she has notice an improvement with her speech.   She states she also has pain in her bilateral shoulders. She rates her pain 2.  Mother in room.   Pain Inventory Average Pain 6 Pain Right Now 2 My pain is burning  LOCATION OF PAIN  head, shoulder, leg, left side  BOWEL Number of stools per week: 2-3 Oral laxative use No  Type of laxative na Enema or suppository use No  History of colostomy Yes  Incontinent No   BLADDER Normal In and out cath, frequency na Able to self cath  na Bladder incontinence No  Frequent urination No  Leakage with coughing No  Difficulty starting stream No  Incomplete bladder emptying No    Mobility use a cane how many minutes can you walk? 5-7 ability to climb steps?  yes do you drive?  no  Function employed # of hrs/week short term disability disabled: date disabled .  Neuro/Psych bowel control problems numbness trouble walking spasms dizziness confusion depression anxiety  Prior Studies Any changes since last visit?  no  Physicians involved in your care Primary care Amber Rieger, NP   Family History  Problem Relation Age of Onset   Hyperlipidemia Mother  Arthritis Father    Diabetes Father    Hypertension Father    Hyperlipidemia Father    Prostate cancer Father    Heart attack Maternal Grandfather    Hyperlipidemia Maternal Grandfather    Prostate cancer Maternal Grandfather    Diabetes Paternal Grandmother    Stroke Paternal Grandmother    Diabetes Paternal Grandfather    Heart attack Paternal Grandfather    Prostate cancer Maternal Uncle        x 3   Colon cancer Neg Hx    Colon polyps Neg Hx    Stomach cancer Neg Hx    Esophageal cancer Neg Hx    Pancreatic cancer Neg Hx     Social History   Socioeconomic History   Marital status: Single    Spouse name: Not on file   Number of children: Not on file   Years of education: Not on file   Highest education level: Not on file  Occupational History   Not on file  Tobacco Use   Smoking status: Never   Smokeless tobacco: Never  Vaping Use   Vaping Use: Never used  Substance and Sexual Activity   Alcohol use: Yes    Alcohol/week: 7.0 standard drinks    Types: 7 Glasses of wine per week   Drug use: Yes    Types: Marijuana    Comment: 2x/month   Sexual activity: Not Currently  Other Topics Concern   Not on file  Social History Narrative   Originally from [place]   Family lives up here in [place]   Has one daughter.   Enjoys spending time shopping and spending time with family    Social Determinants of Health   Financial Resource Strain: Not on file  Food Insecurity: Not on file  Transportation Needs: Not on file  Physical Activity: Not on file  Stress: Not on file  Social Connections: Not on file   Past Surgical History:  Procedure Laterality Date   ABDOMINAL HYSTERECTOMY  10/2006   bowel reconstruction  10/2006   with hysterectomy   CESAREAN SECTION  2005   EP IMPLANTABLE DEVICE N/A 03/17/2015   Procedure: Loop Recorder Insertion;  Surgeon: Will Jorja Loa, MD;  [facility] INVASIVE CV LAB;  Service: Cardiovascular;  Laterality: N/A;   TEE WITHOUT CARDIOVERSION N/A 03/17/2015   Procedure: TRANSESOPHAGEAL ECHOCARDIOGRAM (TEE);  Surgeon: Chilton Si, MD;  Location: [facility] ENDOSCOPY;  Service: Cardiovascular;  Laterality: N/A;   TEE WITHOUT CARDIOVERSION N/A 01/06/2021   Procedure: TRANSESOPHAGEAL ECHOCARDIOGRAM (TEE);  Surgeon: [name], MD;  Location: [facility] ORS;  Service: Cardiovascular;  Laterality: N/A;   TOE SURGERY     Left 2nd metatarsal   Past Medical History:  Diagnosis Date   Anxiety    self reported   Asthma    Depression    controlled   Diabetes  mellitus without complication (HCC)    Hyperlipidemia    controlled with medication   IBS (irritable bowel syndrome)    Slurred speech    Stroke (HCC) 03/13/2015   Tension headache 11/18/2020   Word finding difficulty 02/27/2018   BP 114/79   Pulse 77   Temp 98.2 F (36.8 C) (Oral)   Ht 5\' 2"  (1.575 m)   Wt 173 lb 9.6 oz (78.7 kg)   SpO2 98%   BMI 31.75 kg/m   Opioid Risk Score:   Fall Risk Score:  `1  Depression screen PHQ 2/9  Depression screen [facility] 2/9 02/03/2021 10/03/2020 05/07/2020 01/23/2020  12/17/2019 12/10/2019 10/15/2019  Decreased Interest 0 0 0 0 1 1 0  Down, Depressed, Hopeless 1 0 0 0 0 1 0  PHQ - 2 Score 1 0 0 0 1 2 0  Altered sleeping 1 - - 0 1 1 -  Tired, decreased energy 1 - - 1 1 2  -  Change in appetite 1 - - 0 0 1 -  Feeling bad or failure about yourself  0 - - 0 0 0 -  Trouble concentrating 1 - - 1 0 1 -  Moving slowly or fidgety/restless 1 - - 0 0 0 -  Suicidal thoughts 0 - - 0 0 0 -  PHQ-9 Score 6 - - 2 3 7  -  Difficult doing work/chores Somewhat difficult - - Not difficult at all - Not difficult at all Not difficult at all  Some recent data might be hidden     Review of Systems  Musculoskeletal:  Positive for gait problem.       Spasms  Neurological:  Positive for dizziness and numbness.  Psychiatric/Behavioral:  Positive for confusion and dysphoric mood. The patient is nervous/anxious.   All other systems reviewed and are negative.     Objective:   Physical Exam Vitals and nursing note reviewed.  Constitutional:      Appearance: Normal appearance.  Cardiovascular:     Rate and Rhythm: Normal rate and regular rhythm.     Pulses: Normal pulses.     Heart sounds: Normal heart sounds.  Pulmonary:     Effort: Pulmonary effort is normal.     Breath sounds: Normal breath sounds.  Musculoskeletal:     Cervical back: Normal range of motion and neck supple.     Comments: Normal Muscle Bulk and Muscle Testing Reveals:  Upper Extremities: Full ROM and  Muscle Strength 4/5  Lower Extremities: Full ROM and Muscle Strength 4/5  Arises from Table Slowly using cane for support Wide based Gait     Skin:    General: Skin is warm and dry.  Neurological:     Mental Status: She is alert and oriented to person, place, and time.  Psychiatric:        Mood and Affect: Mood normal.        Behavior: Behavior normal.         Assessment & Plan:  1.Acute CVA: Has a scheduled appointment with Neurology Dr . She has a scheduled appointment with [facility]. ' 2.Diabetes Mellitus Type 2, Controlled.  PCP following. Continue to monitor.  3. Chest Pain: Refused EMS. Her  mother r will take her to [facility] ED, she reports.  4. GERD: PCP Following: Ms. Strough reports she was prescribed Pepcid. PCP following.   F/U with Dr [facility] in 4-6 weeks

## 2021-02-03 NOTE — ED Provider Notes (Signed)
Emergency Medicine Provider Triage Evaluation Note  Amber [name] , a 50 y.o. female  was evaluated in triage.  Pt complains of chest pain.  Reports that chest pain has been intermittent since last Tuesday.  Pain is located under left breast and sometimes radiates to left arm.  Patient describes pain as a "squeezing and tightness."  Patient endorses associated shortness of breath with chest pain.  Last had episode of chest pain approximately 1400 today, no chest pain at present.  No associated nausea, vomiting, diaphoresis.  Patient reports that pain is sometimes worse with sitting up or laying back.  Patient has had no relief with Zantac.  Review of Systems  Positive: Chest pain, shortness of breath Negative: Nausea, vomiting, diaphoresis, unilateral leg swelling or tenderness  Physical Exam  BP (!) 125/112 (BP Location: Right Arm)   Pulse 82   Temp 98.8 F (37.1 C) (Oral)   Resp 16   SpO2 99%  Gen:   Awake, no distress   Resp:  Normal effort, lungs clear to auscultation bilaterally MSK:   Moves extremities without difficulty; no swelling or tenderness to bilateral lower extremities Other:  Abdomen soft, nondistended, no tenderness, no mass or pulsatile mass.  +2 radial pulse bilaterally  Medical Decision Making  Medically screening exam initiated at 4:15 PM.  Appropriate orders placed.  Amber [name] was informed that the remainder of the evaluation will be completed by another provider, this initial triage assessment does not replace that evaluation, and the importance of remaining in the ED until their evaluation is complete.     Amber [name] 02/03/21 1910    Maia Plan, MD 02/03/21 [phone]

## 2021-02-04 ENCOUNTER — Telehealth: Payer: Self-pay | Admitting: Internal Medicine

## 2021-02-04 ENCOUNTER — Ambulatory Visit: Payer: BC Managed Care – PPO | Admitting: Speech Pathology

## 2021-02-04 ENCOUNTER — Encounter: Payer: Self-pay | Admitting: Rehabilitation

## 2021-02-04 ENCOUNTER — Ambulatory Visit: Payer: BC Managed Care – PPO | Attending: Primary Care | Admitting: Rehabilitation

## 2021-02-04 ENCOUNTER — Encounter: Payer: Self-pay | Admitting: Speech Pathology

## 2021-02-04 DIAGNOSIS — R41842 Visuospatial deficit: Secondary | ICD-10-CM | POA: Insufficient documentation

## 2021-02-04 DIAGNOSIS — R278 Other lack of coordination: Secondary | ICD-10-CM

## 2021-02-04 DIAGNOSIS — R4701 Aphasia: Secondary | ICD-10-CM | POA: Insufficient documentation

## 2021-02-04 DIAGNOSIS — I69351 Hemiplegia and hemiparesis following cerebral infarction affecting right dominant side: Secondary | ICD-10-CM | POA: Insufficient documentation

## 2021-02-04 DIAGNOSIS — R2689 Other abnormalities of gait and mobility: Secondary | ICD-10-CM | POA: Insufficient documentation

## 2021-02-04 DIAGNOSIS — R41841 Cognitive communication deficit: Secondary | ICD-10-CM | POA: Insufficient documentation

## 2021-02-04 DIAGNOSIS — R41840 Attention and concentration deficit: Secondary | ICD-10-CM | POA: Insufficient documentation

## 2021-02-04 DIAGNOSIS — M6281 Muscle weakness (generalized): Secondary | ICD-10-CM | POA: Insufficient documentation

## 2021-02-04 DIAGNOSIS — R482 Apraxia: Secondary | ICD-10-CM | POA: Insufficient documentation

## 2021-02-04 DIAGNOSIS — R41844 Frontal lobe and executive function deficit: Secondary | ICD-10-CM | POA: Insufficient documentation

## 2021-02-04 DIAGNOSIS — I6300 Cerebral infarction due to thrombosis of unspecified precerebral artery: Secondary | ICD-10-CM | POA: Insufficient documentation

## 2021-02-04 DIAGNOSIS — M542 Cervicalgia: Secondary | ICD-10-CM | POA: Insufficient documentation

## 2021-02-04 MED ORDER — PANTOPRAZOLE SODIUM 40 MG PO TBEC: 40.0000 mg | DELAYED_RELEASE_TABLET | Freq: Every day | ORAL | 3 refills | Status: DC

## 2021-02-04 NOTE — Telephone Encounter (Signed)
With recent embolic CVA Chest pain radiating down her arm does not atypical presentation for GERD or gastrointestinal pain. Cannot exclude acid reflux given her report of recent heartburn I see that she is on famotidine It would be reasonable for her to begin pantoprazole 40 mg daily Please get her an appointment with next available APP In the interim I would recommend that she contact her primary care provider and cardiologist given her recent stroke and current complaints

## 2021-02-04 NOTE — Therapy (Signed)
[facility] [address] [place], [place], [address] Phone: [phone]   Fax:  [phone]  Speech Language Pathology Treatment  Patient Details  Name: Amber [name] MRN: [id number] Date of Birth: 1971/03/31 Referring Provider (SLP): Mariam Dollar PA   Encounter Date: 02/04/2021   End of Session - 02/04/21 1548     Visit Number 2    Number of Visits 25    Date for SLP Re-Evaluation 04/22/21    Authorization Type BCBS medicaid - 30 VL all disciplines. Will take 10, unless less PT or OT is needed. I have asked them and will modify when they reply. According to notes, private insurance will kick in after medicaid is exhausted.    Authorization - Visit Number 2    Authorization - Number of Visits 10    SLP Start Time 1317    SLP Stop Time  1400    SLP Time Calculation (min) 43 min    Activity Tolerance Patient tolerated treatment well             Past Medical History:  Diagnosis Date   Anxiety    self reported   Asthma    Depression    controlled   Diabetes mellitus without complication (HCC)    Hyperlipidemia    controlled with medication   IBS (irritable bowel syndrome)    Slurred speech    Stroke (HCC) 03/13/2015   Tension headache 11/18/2020   Word finding difficulty 02/27/2018    Past Surgical History:  Procedure Laterality Date   ABDOMINAL HYSTERECTOMY  10/2006   bowel reconstruction  10/2006   with hysterectomy   CESAREAN SECTION  2005   EP IMPLANTABLE DEVICE N/A 03/17/2015   Procedure: Loop Recorder Insertion;  Surgeon: Will Jorja Loa, MD;  [facility];  Service: Cardiovascular;  Laterality: N/A;   TEE WITHOUT CARDIOVERSION N/A 03/17/2015   Procedure: TRANSESOPHAGEAL ECHOCARDIOGRAM (TEE);  Surgeon: Chilton Si, MD;  Location: [facility] ENDOSCOPY;  Service: Cardiovascular;  Laterality: N/A;   TEE WITHOUT CARDIOVERSION N/A 01/06/2021   Procedure: TRANSESOPHAGEAL  ECHOCARDIOGRAM (TEE);  Surgeon: [name], MD;  Location: [facility] ORS;  Service: Cardiovascular;  Laterality: N/A;   TOE SURGERY     Left 2nd metatarsal    There were no vitals filed for this visit.   Subjective Assessment - 02/04/21 1314     Subjective "They sent me to be checked out" re: visit to ED yesterday for chest pain.    Currently in Pain? No/denies                   ADULT SLP TREATMENT - 02/04/21 1324       General Information   Behavior/Cognition Alert;Cooperative;Pleasant mood      Treatment Provided   Treatment provided Cognitive-Linquistic      Cognitive-Linquistic Treatment   Treatment focused on Apraxia;Aphasia;Patient/family/caregiver education    Skilled Treatment [name] enters room with improved rate of speech and accuracy in simple conversation. She endorses improved speech as well and has been consistently following HEP for verbal apraxia. Complex word finding pt rerqiured occasional min  cues to name 15 words with given letter and category. Trained pt on how to complete this task . Cherlynn Perches continues to endorse word fidning, the word will eventually come with a pause or delay. Targeted verbal apraxia repeating and generating sentences with multisyllabic words using slow rate, Tasah generated 10/10 senteces with hesitancy, however they were fluent with rare min A. Added rhyming  sentences to HEP.      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 with occasional min A over 2 sessions    Baseline No HEP    Time 6    Period Weeks    Status On-going      SLP SHORT TERM GOAL #2   Title Pt will name 15 items in persoanlly relevant category with rare min A    Baseline 12 items    Time 6    Period Weeks    Status On-going      SLP SHORT TERM GOAL #3   Title Pt will use pill organizer to independenlty manage medications over 1 weeks with mod I    Baseline daughter and mom A pt with meds, no pill organizer    Time 6    Period Weeks    Status On-going      SLP SHORT TERM GOAL #4   Title Pt will demonstrate 4 or less dysfluencies in 8 minute conversatoin with rare min A    Baseline 8 dysfluencies    Time 6    Period Weeks    Status On-going              SLP Long Term Goals - 02/04/21 1548       SLP LONG TERM GOAL #1   Title Pt will carryover verbal compensatory strategies for word finding episodes as needed in 15 minute conversation with rare min A    Baseline no compensatory strategies    Time 12    Period Weeks    Status On-going      SLP LONG TERM GOAL #2   Title Pt will demonstrate fluent speech over 15 minute converation with rare min A over 2 sessions    Baseline Pt dysfluent at sentence level    Time 12    Period Weeks    Status On-going      SLP LONG TERM GOAL #3   Title Pt will utilize calendar and external aids to manage appointments, schedule, to do list with mod I over 2 sessions    Baseline daughter and mom managing her schedule/appointments    Time 12    Period Weeks    Status On-going      SLP LONG TERM GOAL #4   Title Pt will double check and correct texts prior to sending then with supervision cues    Baseline Pt sending error and non sensical texts without awareness    Time 12    Period Weeks    Status On-going              Plan - 02/04/21 1544     Clinical Impression Statement [name] reports some improvement in ability to increase  rate of speech successfully.She has been carrying over HEP  for verbal apraxia consisitently.Added to verbal apraxia HEP. Complex naming targeted with occasional min A. Initiated training in language activities to carryover at home. Continue skilled ST to maximize intelligiblity, communication for safety, independence and QOL and possible return to work in some capactiy    Speech Therapy Frequency 2x / week    Duration 12 weeks    Treatment/Interventions Language facilitation;Environmental controls;Cueing hierarchy;SLP instruction and feedback;Compensatory strategies;Functional tasks;Cognitive reorganization;Compensatory techniques;Patient/family education;Multimodal communcation approach;Internal/external aids    Potential to Achieve Goals Good             Patient will benefit from skilled therapeutic intervention in order to improve the following deficits and impairments:   Aphasia  Verbal apraxia    Problem List Patient Active Problem List   Diagnosis Date Noted   Muscle cramps    Diabetes mellitus type II, controlled (HCC)    Cramp in limb    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   Controlled type 2 diabetes mellitus with complication, without long-term current use of insulin (HCC)    Vascular headache    Dyslipidemia    Cryptogenic stroke (HCC)    Expressive aphasia    Acute CVA (cerebrovascular accident) (HCC) 01/02/2021   Dizziness 12/18/2020   Tension headache 11/18/2020   Acute neck pain 11/18/2020   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   Splinter in skin, left foot 12/11/2019   Allergic rhinitis due to pollen 10/15/2019   Concentration deficit 08/14/2019   Non-cardiac chest pain 05/18/2019   Anxiety 04/13/2019   Weight 03/06/2019   Right flank pain 11/22/2018   Left shoulder pain 08/02/2018   Restless leg syndrome 12/29/2016   GERD (gastroesophageal reflux disease) 08/24/2016   Hemorrhoids 08/24/2016   Healthcare maintenance 08/24/2016   GAD (generalized anxiety  disorder) 06/22/2016   Levoscoliosis 10/06/2015   Type 2 diabetes mellitus (HCC) 09/08/2015   Fatigue 04/03/2015   History of stroke 03/14/2015   IBS (irritable bowel syndrome) 09/29/2014   Hyperlipidemia 09/29/2014   Essential hypertension 09/29/2014    Konstantinos Cordoba, Radene Journey MS, CCC-SLP 02/04/2021, 3:50 PM  [facility] [address] [place], [place], [address] Phone: [phone]   Fax:  [phone]   Name: Mikea Quadros MRN: [id number] Date of Birth: 1971/05/23

## 2021-02-04 NOTE — Telephone Encounter (Signed)
Pt is requesting to be seen asap. She has been experiencing pain under the left side of her breast. She recently had a stroke so is not sure if it is related to the medication that she has been taken. She was seen at [facility] ED yesterday for sxs. Pls call her.

## 2021-02-04 NOTE — Telephone Encounter (Signed)
Spoke with pt and she is aware of Dr. Lauro Franklin recommendations. Scheduled pt to see Hyacinth Meeker PA 02/16/21 at 1:30pm. Prescription sent to pharmacy and pt aware.

## 2021-02-04 NOTE — Telephone Encounter (Signed)
Please see note below and advise if this is GI related. Pt was seen in ER yesterday for chest pain radiating down her left arm.  Please advise

## 2021-02-04 NOTE — Patient Instructions (Signed)
Access Code: [email or web address] URL: [email or web address] Date: 02/04/2021 Prepared by: Harriet Butte  Exercises Sit to Stand Without Arm Support - 2 x daily - 7 x weekly - 1 sets - 10 reps

## 2021-02-04 NOTE — Therapy (Signed)
[facility] [address] [place], [place], [address] Phone: [phone]   Fax:  [phone]  Physical Therapy Treatment  Patient Details  Name: Amber [name] MRN: [id number] Date of Birth: 01/13/1971 Referring Provider (PT): Cruzita Lederer, PA-C   Encounter Date: 02/04/2021   PT End of Session - 02/04/21 1450     Visit Number 2    Number of Visits 17    Date for PT Re-Evaluation 03/25/21    Authorization Type BCBS primary; UHC MCD secondary (no auth required)    PT Start Time 1055    PT Stop Time 1143    PT Time Calculation (min) 48 min    Equipment Utilized During Treatment Gait belt    Activity Tolerance Patient tolerated treatment well    Behavior During Therapy [facility] for tasks assessed/performed             Past Medical History:  Diagnosis Date   Anxiety    self reported   Asthma    Depression    controlled   Diabetes mellitus without complication (HCC)    Hyperlipidemia    controlled with medication   IBS (irritable bowel syndrome)    Slurred speech    Stroke (HCC) 03/13/2015   Tension headache 11/18/2020   Word finding difficulty 02/27/2018    Past Surgical History:  Procedure Laterality Date   ABDOMINAL HYSTERECTOMY  10/2006   bowel reconstruction  10/2006   with hysterectomy   CESAREAN SECTION  2005   EP IMPLANTABLE DEVICE N/A 03/17/2015   Procedure: Loop Recorder Insertion;  Surgeon: Will Jorja Loa, MD;  [facility]AS[facility] LAB;  Service: Cardiovascular;  Laterality: N/A;   TEE WITHOUT CARDIOVERSION N/A 03/17/2015   Procedure: TRANSESOPHAGEAL ECHOCARDIOGRAM (TEE);  Surgeon: Chilton Si, MD;  Location: [facility] ENDOSCOPY;  Service: Cardiovascular;  Laterality: N/A;   TEE WITHOUT CARDIOVERSION N/A 01/06/2021   Procedure: TRANSESOPHAGEAL ECHOCARDIOGRAM (TEE);  Surgeon: [name], MD;  Location: [facility] ORS;  Service: Cardiovascular;  Laterality: N/A;   TOE SURGERY     Left 2nd  metatarsal    There were no vitals filed for this visit.   Subjective Assessment - 02/04/21 1101     Subjective Went to ED yesterday for chest pain.  Labs were normal.  Pt feels that GERD is issue, is going to call gastro MD today.  Pt very talkative and requires cues to redirect at times during session.    Pertinent History history of CVA in 2016 and 2022 , type 2 diabetes, hypertension, hyperlipidemia    How long can you sit comfortably? no issues    Currently in Pain? Yes    Pain Score 5     Pain Location Epigastric    Pain Orientation Left    Pain Descriptors / Indicators Aching;Tightness    Pain Type Chronic pain    Pain Onset 1 to 4 weeks ago    Pain Frequency Constant    Aggravating Factors  nothing, comes and goes    Pain Relieving Factors nothing                [facility] PT Assessment - 02/04/21 1103       Functional Gait  Assessment   Gait assessed  Yes    Gait Level Surface Walks 20 ft, slow speed, abnormal gait pattern, evidence for imbalance or deviates 10-15 in outside of the 12 in walkway width. Requires more than 7 sec to ambulate 20 ft.   22.59 secs  Change in Gait Speed Able to change speed, demonstrates mild gait deviations, deviates 6-10 in outside of the 12 in walkway width, or no gait deviations, unable to achieve a major change in velocity, or uses a change in velocity, or uses an assistive device.    Gait with Horizontal Head Turns Performs head turns smoothly with slight change in gait velocity (eg, minor disruption to smooth gait path), deviates 6-10 in outside 12 in walkway width, or uses an assistive device.    Gait with Vertical Head Turns Performs task with moderate change in gait velocity, slows down, deviates 10-15 in outside 12 in walkway width but recovers, can continue to walk.    Gait and Pivot Turn Turns slowly, requires verbal cueing, or requires several small steps to catch balance following turn and stop    Step Over Obstacle Is able to step  over one shoe box (4.5 in total height) but must slow down and adjust steps to clear box safely. May require verbal cueing.    Gait with Narrow Base of Support Ambulates less than 4 steps heel to toe or cannot perform without assistance.    Gait with Eyes Closed Walks 20 ft, slow speed, abnormal gait pattern, evidence for imbalance, deviates 10-15 in outside 12 in walkway width. Requires more than 9 sec to ambulate 20 ft.    Ambulating Backwards Walks 20 ft, slow speed, abnormal gait pattern, evidence for imbalance, deviates 10-15 in outside 12 in walkway width.    Steps Two feet to a stair, must use rail.    Total Score 11                Access Code: [email or web address] URL: [email or web address] Date: 02/04/2021 Prepared by: Harriet Butte  Exercises Sit to Stand Without Arm Support - 2 x daily - 7 x weekly - 1 sets - 10 reps                 Upper Extremity Functional Index Score :   /80   PT Education - 02/04/21 1450     Education Details educated on FGA results and sit<>stand exercise    Person(s) Educated Patient    Methods Explanation;Demonstration;Handout    Comprehension Verbalized understanding;Returned demonstration              PT Short Term Goals - 01/28/21 1200       PT SHORT TERM GOAL #1   Title Pt will be able to ambulate 400 feet with st. cane on grass to navigate community    Baseline NOt tried (eval)    Time 4    Period Weeks    Status New    Target Date 02/25/21      PT SHORT TERM GOAL #2   Title Pt will demo 5x sit to stand without HHA in under <25 seconds to improve overall functional strength    Baseline 33 sec (eval)    Time 4    Period Weeks    Status New    Target Date 02/25/21      PT SHORT TERM GOAL #3   Title Patient will be able to go up and down stairs with reciprocal steps with use of 1 hand rail to improve navigating steps at home    Baseline step to pattern with rail (eval(    Time 4    Period Weeks     Status New    Target Date 02/25/21  PT Long Term Goals - 02/04/21 1454       PT LONG TERM GOAL #1   Title Pt will dem 5x sit to stand <20 seconds without HHA to improve functional strength with trasnfers    Baseline 33 sec (eval)    Time 8    Period Weeks    Status New      PT LONG TERM GOAL #2   Title Pt will be able to ambulate >1 m/s with LRAD to improve gait speed to improve community ambulation    Baseline 0.76m/s with st. cane (eval)    Time 8    Period Weeks    Status New      PT LONG TERM GOAL #3   Title Pt will be able to ambulate on grass for 200 feet without AD and SBA to improve community negotiation    Baseline not tried (Eval)    Time 8    Period Weeks    Status New      PT LONG TERM GOAL #4   Title Pt will improve FGA score to >/=19/30 in order to indicate dec fall risk.    Time 8    Period Weeks    Status New                   Plan - 02/04/21 1451     Clinical Impression Statement Skilled session focused on formal assessment of balance with FGA and initiation of HEP for balance and strength.  Pt scored 11/30 on FGA placing pt at high fall risk.  She requires increased time to complete all tasks and had most trouble with head motions, tandem gait, and eyes closed.  Initiated HEP for strength with sit<>stand.  Pt very slow with movements and note difficulty with stand>sit demonstrating decreased coordination.  Would like to add more balance to HEP on next visit.    Personal Factors and Comorbidities Comorbidity 3+;Time since onset of injury/illness/exacerbation;Transportation    Comorbidities Privous hx of stroke, DM, asthma    Examination-Activity Limitations Caring for Others;Carry;Lift;Squat;Stairs;Stand;Transfers;Bend    Examination-Participation Restrictions Church;Cleaning;Community Activity;Driving;Laundry;Medication Management;Meal Prep;Occupation;Shop;Yard Work    Stability/Clinical Decision Making Stable/Uncomplicated     Rehab Potential Good    PT Frequency 2x / week    PT Duration 8 weeks    PT Treatment/Interventions ADLs/Self Care Home Management;Aquatic Therapy;Cryotherapy;Electrical Stimulation;Moist Heat;Traction;Gait training;Stair training;Functional mobility training;Therapeutic activities;Therapeutic exercise;Balance training;Neuromuscular re-education;Cognitive remediation;Patient/family education;Orthotic Fit/Training;Manual techniques;Passive range of motion;Energy conservation;Visual/perceptual remediation/compensation;Joint Manipulations;Spinal Manipulations    PT Next Visit Plan Add to HEP for balance:  tandem stance/gait, vestibular exercises-head motions, begin to work on speeding gait up with SPC.    Consulted and Agree with Plan of Care Patient             Patient will benefit from skilled therapeutic intervention in order to improve the following deficits and impairments:  Abnormal gait, Decreased activity tolerance, Decreased balance, Decreased coordination, Decreased endurance, Decreased mobility, Decreased safety awareness, Decreased strength, Difficulty walking, Impaired UE functional use, Impaired vision/preception, Postural dysfunction, Improper body mechanics, Pain  Visit Diagnosis: Muscle weakness (generalized)  Other lack of coordination  Hemiplegia and hemiparesis following cerebral infarction affecting right dominant side [facility])     Problem List Patient Active Problem List   Diagnosis Date Noted   Muscle cramps    Diabetes mellitus type II, controlled (HCC)    Cramp in limb    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   Controlled type 2 diabetes mellitus with complication, without long-term current  use of insulin (HCC)    Vascular headache    Dyslipidemia    Cryptogenic stroke (HCC)    Expressive aphasia    Acute CVA (cerebrovascular accident) (HCC) 01/02/2021   Dizziness 12/18/2020   Tension headache 11/18/2020   Acute neck pain 11/18/2020   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  12/11/2019   Splinter in skin, left foot 12/11/2019   Allergic rhinitis due to pollen 10/15/2019   Concentration deficit 08/14/2019   Non-cardiac chest pain 05/18/2019   Anxiety 04/13/2019   Weight 03/06/2019   Right flank pain 11/22/2018   Left shoulder pain 08/02/2018   Restless leg syndrome 12/29/2016   GERD (gastroesophageal reflux disease) 08/24/2016   Hemorrhoids 08/24/2016   Healthcare maintenance 08/24/2016   GAD (generalized anxiety disorder) 06/22/2016   Levoscoliosis 10/06/2015   Type 2 diabetes mellitus (HCC) 09/08/2015   Fatigue 04/03/2015   History of stroke 03/14/2015   IBS (irritable bowel syndrome) 09/29/2014   Hyperlipidemia 09/29/2014   Essential hypertension 09/29/2014    Harriet Butte, PT, MPT [facility] [address] [place], [place], [address] Phone: [phone]   Fax:  [phone] 02/04/21, 2:56 PM   Name: Anelly Samarin MRN: [id number] Date of Birth: 02/15/1971

## 2021-02-04 NOTE — Patient Instructions (Addendum)
  Mindfulness - centering prayer, guided meditation on Youtube Breathe2Relax App  Checkers [employer] games Jig saw puzzles Easy cross words Memory match Board games Dominoes Majong Learn a new game! Scattergories - no timer Outburst - no timer ABC naming - no timer Anomia game - no timer Family Feud - no timer   Listen to and discuss Ted Talks or Podcasts - Neuro Nerds,  Read and discuss short articles of interest to you- Take notes on these if memory is a challenge Discuss social media posts Look and discuss photo albums  The best activities to improve cognition are functional, real life activities that are important to you:  Plan a menu Participate in household chores and decisions (with supervision) Participate in managing finances Plan a party, trip or tailgate with all of the details (even if you aren't really going to carry it out) Participate in your hobby as you are able with assistance Manage your texts, emails with supervision if needed. Google search for items (even if you're not really going to buy anything) and compare prices and features Socialize -  however, too many visitors can be overwhelming, so set limits "My doctor said I should only visit (or talk) for 20 minutes" or "I do better when I visit with just 1-2 people at a time for 20 minutes"    It's good to use real in-person games, not just apps  Apps: NeuroHQ Elevate There are apps for most of the games listed above  [facility] [place] with Dr. Cynda Familia at [facility] - email [email or web address]  TalkPath Therapy app by Sherron Flemings on [employer] website National Aphasia Association - naa.aphasia.org Aphasia Recovery Connection - aphasiarecoveryconnection.org Tactus therapy apps Constant Therapy

## 2021-02-05 ENCOUNTER — Ambulatory Visit: Payer: BC Managed Care – PPO | Admitting: Occupational Therapy

## 2021-02-05 ENCOUNTER — Encounter: Payer: Self-pay | Admitting: Occupational Therapy

## 2021-02-05 ENCOUNTER — Other Ambulatory Visit: Payer: Self-pay

## 2021-02-05 ENCOUNTER — Telehealth: Payer: Self-pay | Admitting: Internal Medicine

## 2021-02-05 DIAGNOSIS — R2689 Other abnormalities of gait and mobility: Secondary | ICD-10-CM

## 2021-02-05 DIAGNOSIS — I6300 Cerebral infarction due to thrombosis of unspecified precerebral artery: Secondary | ICD-10-CM | POA: Diagnosis present

## 2021-02-05 DIAGNOSIS — I69351 Hemiplegia and hemiparesis following cerebral infarction affecting right dominant side: Secondary | ICD-10-CM | POA: Diagnosis present

## 2021-02-05 DIAGNOSIS — R41840 Attention and concentration deficit: Secondary | ICD-10-CM | POA: Diagnosis present

## 2021-02-05 DIAGNOSIS — M542 Cervicalgia: Secondary | ICD-10-CM | POA: Diagnosis present

## 2021-02-05 DIAGNOSIS — R278 Other lack of coordination: Secondary | ICD-10-CM

## 2021-02-05 DIAGNOSIS — R41842 Visuospatial deficit: Secondary | ICD-10-CM | POA: Diagnosis present

## 2021-02-05 DIAGNOSIS — R4701 Aphasia: Secondary | ICD-10-CM | POA: Diagnosis present

## 2021-02-05 DIAGNOSIS — R41841 Cognitive communication deficit: Secondary | ICD-10-CM | POA: Diagnosis present

## 2021-02-05 DIAGNOSIS — R482 Apraxia: Secondary | ICD-10-CM | POA: Diagnosis present

## 2021-02-05 DIAGNOSIS — M6281 Muscle weakness (generalized): Secondary | ICD-10-CM

## 2021-02-05 DIAGNOSIS — R41844 Frontal lobe and executive function deficit: Secondary | ICD-10-CM | POA: Diagnosis present
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
## 2021-02-05 NOTE — Telephone Encounter (Signed)
Pt was recently in the hospital, she would like someone to look over her xrays and labs to make sure pt did not have a heart attack.

## 2021-02-05 NOTE — Patient Instructions (Addendum)
  Coordination Activities  Perform the following activities for 20  minutes 2 times per day with left hand(s).  Rotate ball in fingertips (clockwise and counter-clockwise). Toss ball between hands. Toss ball in air and catch with the same hand. Flip cards 1 at a time as fast as you can. Deal cards with your thumb (Hold deck in hand and push card off top with thumb). Rotate card in hand (clockwise and counter-clockwise). Shuffle cards. Pick up coins, buttons, marbles, dried beans/pasta of different sizes and place in container. Pick up coins and place in container or coin bank. Pick up coins and stack. Pick up coins one at a time until you get 5-10 in your hand, then move coins from palm to fingertips to stack one at a time. Twirl pen between fingers. Screw together nuts and bolts, then unfasten.   Typing:  Left-handed Words  wear were are bear bare care dear deer stare react dare  sad gear great rate  date red read fear free tea tear fast waste treat tease zest wax cast vase vast cat basket earn casket bee seat ate gas rare seed tax fax feed cart art tart taste ear here

## 2021-02-06 ENCOUNTER — Ambulatory Visit (INDEPENDENT_AMBULATORY_CARE_PROVIDER_SITE_OTHER): Payer: BC Managed Care – PPO | Admitting: Internal Medicine

## 2021-02-06 ENCOUNTER — Encounter: Payer: Self-pay | Admitting: Internal Medicine

## 2021-02-06 VITALS — BP 120/90 | HR 98 | Ht 62.0 in | Wt 172.0 lb

## 2021-02-06 DIAGNOSIS — R072 Precordial pain: Secondary | ICD-10-CM

## 2021-02-06 MED ORDER — METOPROLOL TARTRATE 100 MG PO TABS: ORAL_TABLET | ORAL | 0 refills | Status: DC

## 2021-02-06 MED ORDER — METOPROLOL SUCCINATE ER 25 MG PO TB24: 25.0000 mg | ORAL_TABLET | Freq: Every day | ORAL | 6 refills | Status: DC

## 2021-02-06 NOTE — Patient Instructions (Signed)
Medication Instructions:  - Your physician has recommended you make the following change in your medication:   1) START toprol (metoprolol succinate) 25 mg- take 1 tablet by mouth once daily   *If you need a refill on your cardiac medications before your next appointment, please call your pharmacy*   Lab Work: - none ordered  If you have labs (blood work) drawn today and your tests are completely normal, you will receive your results only by: MyChart Message (if you have MyChart) OR A paper copy in the mail If you have any lab test that is abnormal or we need to change your treatment, we will call you to review the results.   Testing/Procedures:  1) Cardiac CT angiogram: - Your physician has requested that you have cardiac CT. Cardiac computed tomography (CT) is a painless test that uses an x-ray machine to take clear, detailed pictures of your heart.    [facility] [address] [place], [place] [address] [phone]   If scheduled at [facility], please arrive 15 mins early for check-in and test prep.  Please follow these instructions carefully (unless otherwise directed):   On the Night Before the Test: Be sure to Drink plenty of water. Do not consume any caffeinated/decaffeinated beverages or chocolate 12 hours prior to your test. Do not take any antihistamines 12 hours prior to your test.  On the Day of the Test: Drink plenty of water until 1 hour prior to the test. Do not eat any food 4 hours prior to the test. You may take your regular medications prior to the test.  Take metoprolol (Lopressor) 100 mg two hours prior to test. FEMALES- please wear underwire-free bra if available, avoid dresses & tight clothing      After the Test: Drink plenty of water. After receiving IV contrast, you may experience a mild flushed feeling. This is normal. On occasion, you may experience a mild rash up to  24 hours after the test. This is not dangerous. If this occurs, you can take Benadryl 25 mg and increase your fluid intake. If you experience trouble breathing, this can be serious. If it is severe call 911 IMMEDIATELY. If it is mild, please call our office. If you take any of these medications: Glipizide/Metformin, Avandament, Glucavance, please do not take 48 hours after completing test unless otherwise instructed.  Please allow 2-4 weeks for scheduling of routine cardiac CTs. Some insurance companies require a pre-authorization which may delay scheduling of this test.   For non-scheduling related questions, please contact the cardiac imaging nurse navigator should you have any questions/concerns: Rockwell AlexandriaSara Wallace, Cardiac Imaging Nurse Navigator Larey BrickMerle Prescott, Cardiac Imaging Nurse Navigator [facility] Direct Office Dial: [phone]   For scheduling needs, including cancellations and rescheduling, please call [place], [phone].    2) You have been referred to : Electrophysiology (Dr. Lalla BrothersLambert/ Dr. Graciela HusbandsKlein) for recurrent stroke/ possible new loop recorder implant    Follow-Up: At [facility], you and your health needs are our priority.  As part of our continuing mission to provide you with exceptional heart care, we have created designated Provider Care Teams.  These Care Teams include your primary Cardiologist (physician) and Advanced Practice Providers (APPs -  Physician Assistants and Nurse Practitioners) who all work together to provide you with the care you need, when you need it.  We recommend signing up for the patient portal called "MyChart".  Sign up information is provided  on this After Visit Summary.  MyChart is used to connect with patients for Virtual Visits (Telemedicine).  Patients are able to view lab/test results, encounter notes, upcoming appointments, etc.  Non-urgent messages can be sent to your provider as well.   To learn more about what  you can do with MyChart, go to [email or web address].    Your next appointment:   1 month(s)  The format for your next appointment:   In Person  Provider:   You may see [name], MD or one of the following Advanced Practice Providers on your designated Care Team:   Nicolasa Ducking, NP Eula Listen, PA-C Marisue Ivan, PA-C Cadence Fransico Michael, [place]   Other Instructions   Metoprolol Extended-Release Tablets What is this medication? METOPROLOL (me TOE proe lole) treats high blood pressure and heart failure. It may also be used to prevent chest pain (angina). It works by lowering your blood pressure and heart rate, making it easier for your heart to pump blood to the rest of your body. It belongs to a group of medications called beta blockers. This medicine may be used for other purposes; ask your health care provider or pharmacist if you have questions. COMMON BRAND NAME(S): toprol, Toprol XL What should I tell my care team before I take this medication? They need to know if you have any of these conditions: Diabetes Heart or vessel disease like slow heart rate, worsening heart failure, heart block, sick sinus syndrome, or Raynaud's disease Kidney disease Liver disease Lung or breathing disease, like asthma or emphysema Pheochromocytoma Thyroid disease An unusual or allergic reaction to metoprolol, other beta blockers, medications, foods, dyes, or preservatives Pregnant or trying to get pregnant Breast-feeding How should I use this medication? Take this medication by mouth. Take it as directed on the prescription label at the same time every day. Take it with food. You may cut the tablet in half if it is scored (has a line in the middle of it). This may help you swallow the tablet if the whole tablet is too big. Be sure to take both halves. Do not take just one-half of the tablet. Keep taking it unless your care team tells you to stop. Talk to your care team about the use of  this medication in children. While it may be prescribed for children as young as 6 years for selected conditions, precautions do apply. Overdosage: If you think you have taken too much of this medicine contact a poison control center or emergency room at once. NOTE: This medicine is only for you. Do not share this medicine with others. What if I miss a dose? If you miss a dose, take it as soon as you can. If it is almost time for your next dose, take only that dose. Do not take double or extra doses. What may interact with this medication? This medication may interact with the following: Certain medications for blood pressure, heart disease, irregular heartbeat Certain medications for depression, like monoamine oxidase (MAO) inhibitors, fluoxetine, or paroxetine Clonidine Dobutamine Epinephrine Isoproterenol Reserpine This list may not describe all possible interactions. Give your health care provider a list of all the medicines, herbs, non-prescription drugs, or dietary supplements you use. Also tell them if you smoke, drink alcohol, or use illegal drugs. Some items may interact with your medicine. What should I watch for while using this medication? Visit your care team for regular checks on your progress. Check your blood pressure as directed. Ask your care team what your blood  pressure should be. Also, find out when you should contact them. Do not treat yourself for coughs, colds, or pain while you are using this medication without asking your care team for advice. Some medications may increase your blood pressure. You may get drowsy or dizzy. Do not drive, use machinery, or do anything that needs mental alertness until you know how this medication affects you. Do not stand up or sit up quickly, especially if you are an older patient. This reduces the risk of dizzy or fainting spells. Alcohol may interfere with the effect of this medication. Avoid alcoholic drinks. This medication may increase  blood sugar. Ask your care team if changes in diet or medications are needed if you have diabetes. What side effects may I notice from receiving this medication? Side effects that you should report to your care team as soon as possible: Allergic reactions-skin rash, itching, hives, swelling of the face, lips, tongue, or throat Heart failure-shortness of breath, swelling of the ankles, feet, or hands, sudden weight gain, unusual weakness or fatigue Low blood pressure-dizziness, feeling faint or lightheaded, blurry vision Raynaud's-cool, numb, or painful fingers or toes that may change color from pale, to blue, to red Slow heartbeat-dizziness, feeling faint or lightheaded, confusion, trouble breathing, unusual weakness or fatigue Worsening mood, feelings of depression Side effects that usually do not require medical attention (report to your care team if they continue or are bothersome): Change in sex drive or performance Diarrhea Dizziness Fatigue Headache This list may not describe all possible side effects. Call your doctor for medical advice about side effects. You may report side effects to FDA at 1-800-FDA-1088. Where should I keep my medication? Keep out of the reach of children and pets. Store at room temperature between 20 and 25 degrees C (68 and 77 degrees F). Throw away any unused medication after the expiration date. NOTE: This sheet is a summary. It may not cover all possible information. If you have questions about this medicine, talk to your doctor, pharmacist, or health care provider.  2022 Elsevier/Gold Standard (2020-06-19 12:55:47)   Cardiac CT Angiogram A cardiac CT angiogram is a procedure to look at the heart and the area around the heart. It may be done to help find the cause of chest pains or other symptoms of heart disease. During this procedure, a substance called contrast dye is injected into the blood vessels in the area to be checked. A large X-ray machine, called  a CT scanner, then takes detailed pictures of the heart and the surrounding area. The procedure is also sometimes called a coronary CT angiogram, coronary artery scanning, or CTA. A cardiac CT angiogram allows the health care provider to see how well blood is flowing to and from the heart. The health care provider will be able to see if there are any problems, such as: Blockage or narrowing of the coronary arteries in the heart. Fluid around the heart. Signs of weakness or disease in the muscles, valves, and tissues of the heart. Tell a health care provider about: Any allergies you have. This is especially important if you have had a previous allergic reaction to contrast dye. All medicines you are taking, including vitamins, herbs, eye drops, creams, and over-the-counter medicines. Any blood disorders you have. Any surgeries you have had. Any medical conditions you have. Whether you are pregnant or may be pregnant. Any anxiety disorders, chronic pain, or other conditions you have that may increase your stress or prevent you from lying still. What  are the risks? Generally, this is a safe procedure. However, problems may occur, including: Bleeding. Infection. Allergic reactions to medicines or dyes. Damage to other structures or organs. Kidney damage from the contrast dye that is used. Increased risk of cancer from radiation exposure. This risk is low. Talk with your health care provider about: The risks and benefits of testing. How you can receive the lowest dose of radiation. What happens before the procedure? Wear comfortable clothing and remove any jewelry, glasses, dentures, and hearing aids. Follow instructions from your health care provider about eating and drinking. This may include: For 12 hours before the procedure -- avoid caffeine. This includes tea, coffee, soda, energy drinks, and diet pills. Drink plenty of water or other fluids that do not have caffeine in them. Being well  hydrated can prevent complications. For 4-6 hours before the procedure -- stop eating and drinking. The contrast dye can cause nausea, but this is less likely if your stomach is empty. Ask your health care provider about changing or stopping your regular medicines. This is especially important if you are taking diabetes medicines, blood thinners, or medicines to treat problems with erections (erectile dysfunction). What happens during the procedure?  Hair on your chest may need to be removed so that small sticky patches called electrodes can be placed on your chest. These will transmit information that helps to monitor your heart during the procedure. An IV will be inserted into one of your veins. You might be given a medicine to control your heart rate during the procedure. This will help to ensure that good images are obtained. You will be asked to lie on an exam table. This table will slide in and out of the CT machine during the procedure. Contrast dye will be injected into the IV. You might feel warm, or you may get a metallic taste in your mouth. You will be given a medicine called nitroglycerin. This will relax or dilate the arteries in your heart. The table that you are lying on will move into the CT machine tunnel for the scan. The person running the machine will give you instructions while the scans are being done. You may be asked to: Keep your arms above your head. Hold your breath. Stay very still, even if the table is moving. When the scanning is complete, you will be moved out of the machine. The IV will be removed. The procedure may vary among health care providers and hospitals. What can I expect after the procedure? After your procedure, it is common to have: A metallic taste in your mouth from the contrast dye. A feeling of warmth. A headache from the nitroglycerin. Follow these instructions at home: Take over-the-counter and prescription medicines only as told by your  health care provider. If you are told, drink enough fluid to keep your urine pale yellow. This will help to flush the contrast dye out of your body. Most people can return to their normal activities right after the procedure. Ask your health care provider what activities are safe for you. It is up to you to get the results of your procedure. Ask your health care provider, or the department that is doing the procedure, when your results will be ready. Keep all follow-up visits as told by your health care provider. This is important. Contact a health care provider if: You have any symptoms of allergy to the contrast dye. These include: Shortness of breath. Rash or hives. A racing heartbeat. Summary A cardiac CT  angiogram is a procedure to look at the heart and the area around the heart. It may be done to help find the cause of chest pains or other symptoms of heart disease. During this procedure, a large X-ray machine, called a CT scanner, takes detailed pictures of the heart and the surrounding area after a contrast dye has been injected into blood vessels in the area. Ask your health care provider about changing or stopping your regular medicines before the procedure. This is especially important if you are taking diabetes medicines, blood thinners, or medicines to treat erectile dysfunction. If you are told, drink enough fluid to keep your urine pale yellow. This will help to flush the contrast dye out of your body. This information is not intended to replace advice given to you by your health care provider. Make sure you discuss any questions you have with your health care provider. Document Revised: 01/10/2019 Document Reviewed: 01/10/2019 Elsevier Patient Education  2022 [employer].

## 2021-02-06 NOTE — Progress Notes (Signed)
New Outpatient Visit Date: 02/06/2021  Primary Care Provider: Doreene Nest, NP [address] [place],  [place] [address]  Chief Complaint: chest pain  HPI:  Amber [name] is a 50 y.o. female who is being seen today for the evaluation of chest pain. She has a history of recurrent strokes, hyperlipidemia, type 2 diabetes mellitus, asthma, anxiety, and IBS.  She was hospitalized last month with acute stroke and underwent TEE that showed no intracardiac thrombus or shunt.  Linear echodensity in the ascending aorta was noted, most likely artifact.  Subsequent CTA chest showed no significant abnormality.  Over the last two weeks, she has experienced intermittent left-sided chest pain and left arm stiffness.  She reports feeling a tightening sensation that comes on randomly.  She has some residual left arm weakness.  She was recently started on a PPI for possible component of GERD but feels like this hasn't helped much.  She also notes associated shortness of breath and orthopnea for which she uses her "asthma pump" with transient improvement.  She was referred to the ED by her PM&R provider 3 days ago for evaluation of aforementioned chest pain.  Initial labs were unremarkable.  EKG made mention of possible anterior infarct.  Due to prolonged wait and improvement in symptoms, Amber [name] left without being seen by a provider.  She is currently asymptomatic but still has sporadic tightness and stiffness in the left chest/arm.  She is also concerned about the potential for having another stroke.  She was told at one point that she may need ot have another loop recorder placed (initial ILR is still in place but is no longer functional).  --------------------------------------------------------------------------------------------------  Cardiovascular History & Procedures: Cardiovascular Problems: Recurrent strokes Chest pain  Risk Factors: Hyperlipidemia, diabetes mellitus, stroke, and  obesity  Cath/PCI: None  CV Surgery: None  EP Procedures and Devices: ILR (03/17/2015)  Non-Invasive Evaluation(s): TEE (01/06/2021): Normal LVEF.  Normal RV size and function.  No LA/LAA thrombus.  Mild MR.  No intracardiac shunt.  Linear echodensity in ascending aorta, favor artifact but cannot rule out aortopathy given h/o recurrent strokes. TTE (01/04/2021): LVEF 55-60% with grade 1 diastolic dysfunction.  Normal RV size and function.  Mild MR.  Negative bubble study.  Recent CV Pertinent Labs: Lab Results  Component Value Date   CHOL 181 01/02/2021   CHOL 184 08/28/2019   HDL 76 01/02/2021   HDL 75 08/28/2019   LDLCALC 86 01/02/2021   LDLCALC 85 08/28/2019   TRIG 94 01/02/2021   CHOLHDL 2.4 01/02/2021   INR 1.1 01/02/2021   K 3.7 02/03/2021   MG 2.1 01/04/2021   BUN 13 02/03/2021   BUN 12 12/17/2019   CREATININE 1.09 (H) 02/03/2021    --------------------------------------------------------------------------------------------------  Past Medical History:  Diagnosis Date   Anxiety    self reported   Asthma    Depression    controlled   Diabetes mellitus without complication (HCC)    Hyperlipidemia    controlled with medication   IBS (irritable bowel syndrome)    Slurred speech    Stroke (HCC) 03/13/2015   Tension headache 11/18/2020   Word finding difficulty 02/27/2018    Past Surgical History:  Procedure Laterality Date   ABDOMINAL HYSTERECTOMY  10/2006   bowel reconstruction  10/2006   with hysterectomy   CESAREAN SECTION  2005   EP IMPLANTABLE DEVICE N/A 03/17/2015   Procedure: Loop Recorder Insertion;  Surgeon: Will Jorja Loa, MD;  Location: [facility] INVASIVE CV  LAB;  Service: Cardiovascular;  Laterality: N/A;   TEE WITHOUT CARDIOVERSION N/A 03/17/2015   Procedure: TRANSESOPHAGEAL ECHOCARDIOGRAM (TEE);  Surgeon: Chilton Si, MD;  Location: [facility] ENDOSCOPY;  Service: Cardiovascular;  Laterality: N/A;   TEE WITHOUT CARDIOVERSION N/A 01/06/2021    Procedure: TRANSESOPHAGEAL ECHOCARDIOGRAM (TEE);  Surgeon: [name], MD;  Location: [facility] ORS;  Service: Cardiovascular;  Laterality: N/A;   TOE SURGERY     Left 2nd metatarsal    Current Meds  Medication Sig   acetaminophen (TYLENOL) 325 MG tablet Take 1-2 tablets (325-650 mg total) by mouth every 4 (four) hours as needed for mild pain.   albuterol (VENTOLIN HFA) 108 (90 Base) MCG/ACT inhaler Inhale 2 puffs into the lungs every 4 (four) hours as needed for wheezing or shortness of breath (cough, shortness of breath or wheezing.).   Ascorbic Acid (VITAMIN C) 1000 MG tablet Take 1 tablet (1,000 mg total) by mouth daily.   azelastine (ASTELIN) 0.1 % nasal spray Place 1 spray into both nostrils 2 (two) times daily. Use in each nostril as directed   cholecalciferol (VITAMIN D3) 25 MCG (1000 UNIT) tablet Take 1 tablet (1,000 Units total) by mouth daily.   clopidogrel (PLAVIX) 75 MG tablet Take 1 tablet (75 mg total) by mouth daily.   famotidine (PEPCID) 20 MG tablet Take 1 tablet (20 mg total) by mouth 2 (two) times daily. For heartburn.   metFORMIN (GLUCOPHAGE-XR) 500 MG 24 hr tablet Take 500 mg by mouth daily with breakfast.   methocarbamol (ROBAXIN) 500 MG tablet Take 1 tablet (500 mg total) by mouth every 8 (eight) hours as needed for muscle spasms.   metoprolol succinate (TOPROL XL) 25 MG 24 hr tablet Take 1 tablet (25 mg total) by mouth daily.   metoprolol tartrate (LOPRESSOR) 100 MG tablet Take 1 tablet (100 mg) by mouth 2 hours prior to your Cardiac CT   Multiple Vitamins-Minerals (MULTIVITAMIN WITH MINERALS) tablet Take 1 tablet by mouth daily.   oxyCODONE (OXY IR/ROXICODONE) 5 MG immediate release tablet Take 0.5 tablets (2.5 mg total) by mouth every 6 (six) hours as needed for severe pain.   pantoprazole (PROTONIX) 40 MG tablet Take 1 tablet (40 mg total) by mouth daily.   rosuvastatin (CRESTOR) 40 MG tablet Take 1 tablet (40 mg total) by mouth daily. For cholesterol.   topiramate  (TOPAMAX) 25 MG tablet Take 25 mg by mouth daily as needed.    Allergies: Keflex [cephalexin]  Social History   Tobacco Use   Smoking status: Never   Smokeless tobacco: Never  Vaping Use   Vaping Use: Never used  Substance Use Topics   Alcohol use: Not Currently    Alcohol/week: 7.0 standard drinks    Types: 7 Glasses of wine per week    Comment: red wine occasionally but not since stroke   Drug use: Not Currently    Types: Marijuana    Comment: edibles-twice weekly    Family History  Problem Relation Age of Onset   Hyperlipidemia Mother    Arthritis Father    Diabetes Father    Hypertension Father    Hyperlipidemia Father    Prostate cancer Father    Heart attack Maternal Grandfather    Hyperlipidemia Maternal Grandfather    Prostate cancer Maternal Grandfather    Diabetes Paternal Grandmother    Stroke Paternal Grandmother    Diabetes Paternal Grandfather    Heart attack Paternal Grandfather    Prostate cancer Maternal Uncle  x 3   Colon cancer Neg Hx    Colon polyps Neg Hx    Stomach cancer Neg Hx    Esophageal cancer Neg Hx    Pancreatic cancer Neg Hx     Review of Systems: A 12-system review of systems was performed and was negative except as noted in the HPI.  --------------------------------------------------------------------------------------------------  Physical Exam: BP 120/90 (BP Location: Right Arm, Patient Position: Sitting, Cuff Size: Large)   Pulse 98   Ht 5\' 2"  (1.575 m)   Wt 172 lb (78 kg)   SpO2 96%   BMI 31.46 kg/m   General:  NAD. HEENT: No conjunctival pallor or scleral icterus. Facemask in place. Neck: Supple without lymphadenopathy, thyromegaly, JVD, or HJR. No carotid bruit. Lungs: Normal work of breathing. Clear to auscultation bilaterally without wheezes or crackles. Heart: Regular rate and rhythm without murmurs, rubs, or gallops. Non-displaced PMI. Abd: Bowel sounds present. Soft, NT/ND without  hepatosplenomegaly Ext: No lower extremity edema. Radial, PT, and DP pulses are 2+ bilaterally Skin: Warm and dry without rash. Neuro: CNIII-XII intact. Strength and fine-touch sensation intact in upper and lower extremities bilaterally.  Mild word-finding difficulty noted. Psych: Normal mood and affect.  EKG (02/03/2021):  Normal sinus rhythm with sinus arrhythmia and poor R wave progression in V2-V3 (most likely due to lead placement).  Lab Results  Component Value Date   WBC 5.0 02/03/2021   HGB 14.7 02/03/2021   HCT 46.8 (H) 02/03/2021   MCV 89.8 02/03/2021   PLT 249 02/03/2021    Lab Results  Component Value Date   NA 138 02/03/2021   K 3.7 02/03/2021   CL 105 02/03/2021   CO2 25 02/03/2021   BUN 13 02/03/2021   CREATININE 1.09 (H) 02/03/2021   GLUCOSE 98 02/03/2021   ALT 35 01/08/2021    Lab Results  Component Value Date   CHOL 181 01/02/2021   HDL 76 01/02/2021   LDLCALC 86 01/02/2021   TRIG 94 01/02/2021   CHOLHDL 2.4 01/02/2021     --------------------------------------------------------------------------------------------------  ASSESSMENT AND PLAN: Chest pain: Pain is atypical and most likely musculoskeletal.  Query muscle spasms related to recent stroke.  However, given multiple risk factors, we have agreed to obtain a coronary CTA to exclude obstructive CAD and anomalous coronary artery.  Continue clopidogrel and rosuvastatin as part of secondary prevention for recurrent strokes.  We will also add metoprolol succinate 25 mg daily.  Recurrent strokes: Patient with at least 3 acute strokes from 2016 until now.  Extensive workup thus far has been unrevealing, including TEE x 2 and ILR (placed in 2016 and no longer functional).  We will refer Amber [name] to EP for removal of nonfunctional ILR and consideration of repeat ILR placement.  Neurology follow-up with Dr. 2017 is also pending.   Continue clopidogrel and rosuvastatin.  Hyperlipidemia associated with  type 2 diabetes mellitus: LDL just above goal on last check on 01/02/2021.  Continue rosuvastatin 40 mg daily for target LDL < 70.  Ongoing management of DM per PCP.  Follow-up: Return to clinic in 1 month.  03/04/2021, MD 02/07/2021 3:06 PM

## 2021-02-06 NOTE — Telephone Encounter (Signed)
Scheduled today with End  

## 2021-02-06 NOTE — Telephone Encounter (Addendum)
It appears that this patient has not been seen in office since 2016. Dr. Okey Dupre recently performed a TEE in the hospital in August. It doesn't appear there was an official Cardiology consult. Someone outside of the [place] office has scheduled the patient to see [facility] [place], [place]. It would appear that based on the above info, this patient should be rescheduled as a new patient appt with Dr. Okey Dupre.

## 2021-02-07 ENCOUNTER — Encounter: Payer: Self-pay | Admitting: Internal Medicine

## 2021-02-10 ENCOUNTER — Telehealth (HOSPITAL_COMMUNITY): Payer: Self-pay | Admitting: Emergency Medicine

## 2021-02-10 NOTE — Telephone Encounter (Signed)
Reaching out to patient to offer assistance regarding upcoming cardiac imaging study; pt verbalizes understanding of appt date/time, parking situation and where to check in, pre-test NPO status and medications ordered, and verified current allergies; name and call back number provided for further questions should they arise Rockwell Alexandria RN Navigator Cardiac Imaging [facility] [phone] office [phone] cell  Difficult IV  Denies claustro 100mg  metoprolol

## 2021-02-11 ENCOUNTER — Other Ambulatory Visit: Payer: Self-pay

## 2021-02-11 ENCOUNTER — Encounter: Payer: Self-pay | Admitting: Speech Pathology

## 2021-02-11 ENCOUNTER — Telehealth: Payer: Self-pay | Admitting: Primary Care

## 2021-02-11 ENCOUNTER — Ambulatory Visit: Payer: BC Managed Care – PPO

## 2021-02-11 ENCOUNTER — Ambulatory Visit: Payer: BC Managed Care – PPO | Admitting: Occupational Therapy

## 2021-02-11 ENCOUNTER — Encounter: Payer: Self-pay | Admitting: Occupational Therapy

## 2021-02-11 ENCOUNTER — Ambulatory Visit: Payer: BC Managed Care – PPO | Admitting: Speech Pathology

## 2021-02-11 VITALS — BP 112/85 | HR 66

## 2021-02-11 DIAGNOSIS — R41840 Attention and concentration deficit: Secondary | ICD-10-CM

## 2021-02-11 DIAGNOSIS — R4701 Aphasia: Secondary | ICD-10-CM

## 2021-02-11 DIAGNOSIS — M542 Cervicalgia: Secondary | ICD-10-CM

## 2021-02-11 DIAGNOSIS — R41842 Visuospatial deficit: Secondary | ICD-10-CM

## 2021-02-11 DIAGNOSIS — I69351 Hemiplegia and hemiparesis following cerebral infarction affecting right dominant side: Secondary | ICD-10-CM

## 2021-02-11 DIAGNOSIS — R482 Apraxia: Secondary | ICD-10-CM

## 2021-02-11 DIAGNOSIS — M6281 Muscle weakness (generalized): Secondary | ICD-10-CM | POA: Diagnosis not present

## 2021-02-11 DIAGNOSIS — R2689 Other abnormalities of gait and mobility: Secondary | ICD-10-CM

## 2021-02-11 DIAGNOSIS — I6300 Cerebral infarction due to thrombosis of unspecified precerebral artery: Secondary | ICD-10-CM

## 2021-02-11 DIAGNOSIS — R278 Other lack of coordination: Secondary | ICD-10-CM

## 2021-02-11 DIAGNOSIS — R41844 Frontal lobe and executive function deficit: Secondary | ICD-10-CM

## 2021-02-11 NOTE — Patient Instructions (Addendum)
   Brain fog and brain fatigue are real - If you push too hard and don't take breaks, your brain will fog and you won't be effective the rest of the day  Work for 15 to 30 minutes then take a break  Write down to do list - prioritize  top 3  tasks for a day or a week  Keep calendar for to do and appointments   Tips to help facilitate better attention, concentration, focus   Do harder, longer tasks when you are most alert/awake  Break down larger tasks into small parts  Limit distractions of TV, radio, conversation, e mails/texts, appliance noise, etc - if a job is important, do it in a quiet room  Be aware of how you are functioning in high stimulation environments such as large stores, parties, restaurants - any place with lots of lights, noise, signs etc  Group conversations may be more difficult to process than one on one conversations  Give yourself extra time to process conversation, reading materials, directions or information from your healthcare providers  Organization is key - clutters of laundry, mail, paperwork, dirty dishes - all make it more difficult to concentrate  Before you start a task, have all the needed supplies, directions, recipes ready and organized. This way you don't have to go looking for something in the middle of a task and become distracted.   Be aware of fatigue - take rests or breaks when needed to re-group and re-focus

## 2021-02-11 NOTE — Therapy (Signed)
[facility] [address] [place], [place], [address] Phone: [phone]   Fax:  [phone]  Speech Language Pathology Treatment  Patient Details  Name: Amber [name] MRN: [id number] Date of Birth: Jul 30, 1970 Referring Provider (SLP): Mariam Dollar PA   Encounter Date: 02/11/2021   End of Session - 02/11/21 1114     Visit Number 3    Number of Visits 25    Date for SLP Re-Evaluation 04/22/21    Authorization Type BCBS medicaid - 30 VL all disciplines. . According to notes, private insurance will kick in after medicaid is exhausted, double check this when she has reached 10 ST visits or 30 mulit D visits.    Authorization - Visit Number 3    Authorization - Number of Visits 10    SLP Start Time 1016    SLP Stop Time  1100    SLP Time Calculation (min) 44 min    Activity Tolerance Patient tolerated treatment well             Past Medical History:  Diagnosis Date   Anxiety    self reported   Asthma    Depression    controlled   Diabetes mellitus without complication (HCC)    Hyperlipidemia    controlled with medication   IBS (irritable bowel syndrome)    Slurred speech    Stroke (HCC) 03/13/2015   Tension headache 11/18/2020   Word finding difficulty 02/27/2018    Past Surgical History:  Procedure Laterality Date   ABDOMINAL HYSTERECTOMY  10/2006   bowel reconstruction  10/2006   with hysterectomy   CESAREAN SECTION  2005   EP IMPLANTABLE DEVICE N/A 03/17/2015   Procedure: Loop Recorder Insertion;  Surgeon: Will Jorja Loa, MD;  Location: [facility] INVASIVE CV LAB;  Service: Cardiovascular;  Laterality: N/A;   TEE WITHOUT CARDIOVERSION N/A 03/17/2015   Procedure: TRANSESOPHAGEAL ECHOCARDIOGRAM (TEE);  Surgeon: Chilton Si, MD;  Location: [facility] ENDOSCOPY;  Service: Cardiovascular;  Laterality: N/A;   TEE WITHOUT CARDIOVERSION N/A 01/06/2021   Procedure: TRANSESOPHAGEAL ECHOCARDIOGRAM (TEE);  Surgeon:  [name], MD;  Location: [facility] ORS;  Service: Cardiovascular;  Laterality: N/A;   TOE SURGERY     Left 2nd metatarsal    There were no vitals filed for this visit.   Subjective Assessment - 02/11/21 1023     Subjective "It's been going well, just a few hiccups"    Currently in Pain? No/denies                   ADULT SLP TREATMENT - 02/11/21 1028       General Information   Behavior/Cognition Alert;Cooperative;Pleasant mood      Treatment Provided   Treatment provided Cognitive-Linquistic      Cognitive-Linquistic Treatment   Treatment focused on Apraxia;Aphasia;Patient/family/caregiver education    Skilled Treatment Amber [name] reports repeating back what she heard nurse and doctor said over phone call and asked for clarification. Amber [name] continues question appointments and difficulty initatiating tasks. Generated strategy of using larger calendar for appointments and top 3 priorities for a day or week. Instructed pt on enery conservation and compensations for attention and memory. Targeted verbal apraxia repeating and generating sentences with multisyllabic words - Amber [name] self corrected with occasional min A, and verbal cues to use 1 syllable at a time as needed. She carried over slow rate and pausing to compensate for verbal apraxia 20/20 sentneces. She has carried over use of pill organizer  managing her meds independently,  however family supervises. In personally relevant category (shopping) Amber [name] wrote and named 15 items, with self corrected spelling and using spelling aloud with rare min A to spell and correct errors. 3 spelling errors      Assessment / Recommendations / Plan   Plan Continue with current plan of care      Progression Toward Goals   Progression toward goals Progressing toward goals                SLP Short Term Goals - 02/11/21 1112       SLP SHORT TERM GOAL #1   Title Pt will complete HEP for verbal apraxia with occasional min A over 2  sessions    Baseline No HEP    Time 5    Period Weeks    Status Achieved      SLP SHORT TERM GOAL #2   Title Pt will name 15 items in persoanlly relevant category with rare min A    Baseline 12 items;   02/11/21    Time 5    Period Weeks    Status On-going      SLP SHORT TERM GOAL #3   Title Pt will use pill organizer to independenlty manage medications over 1 weeks with mod I    Baseline daughter and mom A pt with meds, no pill organizer    Time 6    Period Weeks    Status Achieved      SLP SHORT TERM GOAL #4   Title Pt will demonstrate 4 or less dysfluencies in 8 minute conversatoin with rare min A    Baseline 8 dysfluencies    Time 5    Period Weeks    Status On-going              SLP Long Term Goals - 02/11/21 1113       SLP LONG TERM GOAL #1   Title Pt will carryover verbal compensatory strategies for word finding episodes as needed in 15 minute conversation with rare min A    Baseline no compensatory strategies    Time 11    Period Weeks    Status On-going      SLP LONG TERM GOAL #2   Title Pt will demonstrate fluent speech over 15 minute converation with rare min A over 2 sessions    Baseline Pt dysfluent at sentence level    Time 11    Period Weeks    Status On-going      SLP LONG TERM GOAL #3   Title Pt will utilize calendar and external aids to manage appointments, schedule, to do list with mod I over 2 sessions    Baseline daughter and mom managing her schedule/appointments    Time 11    Period Weeks    Status On-going      SLP LONG TERM GOAL #4   Title Pt will double check and correct texts prior to sending then with supervision cues    Baseline Pt sending error and non sensical texts without awareness    Time 11    Period Weeks    Status On-going              Plan - 02/11/21 1109     Clinical Impression Statement Amber [name] continues to demonstrate ability to speak with increased rate of speech and she is self correcting errors and  adjusting rate slower as needed. Aphasic errors in writing perisist, however she ID's error with mod I  and rare min A to correct errors. Targeted use of calendar and to write to do  list with 3 priorites for a day or week. Continue skilled ST to maximize intelligibility, communication and cognition for safety, independence, QOL and possible return to work in some capactiy    Speech Therapy Frequency 2x / week    Duration 12 weeks    Treatment/Interventions Language facilitation;Environmental controls;Cueing hierarchy;SLP instruction and feedback;Compensatory strategies;Functional tasks;Cognitive reorganization;Compensatory techniques;Patient/family education;Multimodal communcation approach;Internal/external aids    Potential to Achieve Goals Good             Patient will benefit from skilled therapeutic intervention in order to improve the following deficits and impairments:   Aphasia  Verbal apraxia    Problem List Patient Active Problem List   Diagnosis Date Noted   Muscle cramps    Diabetes mellitus type II, controlled (HCC)    Cramp in limb    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   Controlled type 2 diabetes mellitus with complication, without long-term current use of insulin (HCC)    Vascular headache    Dyslipidemia    Cryptogenic stroke (HCC)    Expressive aphasia    Acute CVA (cerebrovascular accident) (HCC) 01/02/2021   Dizziness 12/18/2020   Tension headache 11/18/2020   Acute neck pain 11/18/2020   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   Splinter in skin, left foot 12/11/2019   Allergic rhinitis due to pollen 10/15/2019   Concentration deficit 08/14/2019   Non-cardiac chest pain 05/18/2019   Anxiety 04/13/2019   Weight 03/06/2019   Right flank pain 11/22/2018   Left shoulder pain 08/02/2018   Restless leg syndrome 12/29/2016   GERD (gastroesophageal reflux disease) 08/24/2016   Hemorrhoids 08/24/2016   Healthcare maintenance 08/24/2016   GAD (generalized anxiety  disorder) 06/22/2016   Levoscoliosis 10/06/2015   Type 2 diabetes mellitus (HCC) 09/08/2015   Fatigue 04/03/2015   History of stroke 03/14/2015   IBS (irritable bowel syndrome) 09/29/2014   Hyperlipidemia 09/29/2014   Essential hypertension 09/29/2014    Amber [name], Radene Journey MS, CCC-SLP 02/11/2021, 11:15 AM  [facility] [address] [place], [place], [address] Phone: [phone]   Fax:  [phone]   Name: Amber [name] MRN: [id number] Date of Birth: 09/23/1970

## 2021-02-11 NOTE — Therapy (Signed)
[facility] [address] [place], [place], [address] Phone: [phone]   Fax:  [phone]  Physical Therapy Treatment  Patient Details  Name: Amber [name] MRN: [id number] Date of Birth: 1970/09/23 Referring Provider (PT): Cruzita Lederer, PA-C   Encounter Date: 02/11/2021   PT End of Session - 02/11/21 0853     Visit Number 3    Number of Visits 17    Date for PT Re-Evaluation 03/25/21    Authorization Type BCBS primary; UHC MCD secondary (no auth required)    PT Start Time 0850    PT Stop Time 0930    PT Time Calculation (min) 40 min    Equipment Utilized During Treatment Gait belt    Activity Tolerance Patient tolerated treatment well    Behavior During Therapy [facility] for tasks assessed/performed             Past Medical History:  Diagnosis Date   Anxiety    self reported   Asthma    Depression    controlled   Diabetes mellitus without complication (HCC)    Hyperlipidemia    controlled with medication   IBS (irritable bowel syndrome)    Slurred speech    Stroke (HCC) 03/13/2015   Tension headache 11/18/2020   Word finding difficulty 02/27/2018    Past Surgical History:  Procedure Laterality Date   ABDOMINAL HYSTERECTOMY  10/2006   bowel reconstruction  10/2006   with hysterectomy   CESAREAN SECTION  2005   EP IMPLANTABLE DEVICE N/A 03/17/2015   Procedure: Loop Recorder Insertion;  Surgeon: Will Jorja Loa, MD;  [facility] LAB;  Service: Cardiovascular;  Laterality: N/A;   TEE WITHOUT CARDIOVERSION N/A 03/17/2015   Procedure: TRANSESOPHAGEAL ECHOCARDIOGRAM (TEE);  Surgeon: Chilton Si, MD;  Location: [facility] ENDOSCOPY;  Service: Cardiovascular;  Laterality: N/A;   TEE WITHOUT CARDIOVERSION N/A 01/06/2021   Procedure: TRANSESOPHAGEAL ECHOCARDIOGRAM (TEE);  Surgeon: [name], MD;  Location: [facility] ORS;  Service: Cardiovascular;  Laterality: N/A;   TOE SURGERY     Left 2nd  metatarsal    Vitals:   02/11/21 0856  BP: 112/85  Pulse: 66     Subjective Assessment - 02/11/21 0853     Subjective Had a headache yesterday that lasted half a day. No headache right now. Having Cardiac CT done at [facility] tomorrow.    Pertinent History history of CVA in 2016 and 2022 , type 2 diabetes, hypertension, hyperlipidemia    Limitations Standing;Walking;House hold activities    How long can you sit comfortably? no issues    Currently in Pain? No/denies              Functional reaching: standing on floor with wide BOS - bil OH reach: 10x - Toe touching: 10x - trunk twist with contralateral UE reach: 10x R and L Standing heel raises and toe raises: 3 x 10 no HHA Dot taps: 3 dots placed in front 45 deg and lateral: 5x R and L Walkinf fwd and bwd: 5 x 20 feet no AD                            PT Short Term Goals - 01/28/21 1200       PT SHORT TERM GOAL #1   Title Pt will be able to ambulate 400 feet with st. cane on grass to navigate community    Baseline NOt tried (eval)    Time  4    Period Weeks    Status New    Target Date 02/25/21      PT SHORT TERM GOAL #2   Title Pt will demo 5x sit to stand without HHA in under <25 seconds to improve overall functional strength    Baseline 33 sec (eval)    Time 4    Period Weeks    Status New    Target Date 02/25/21      PT SHORT TERM GOAL #3   Title Patient will be able to go up and down stairs with reciprocal steps with use of 1 hand rail to improve navigating steps at home    Baseline step to pattern with rail (eval(    Time 4    Period Weeks    Status New    Target Date 02/25/21               PT Long Term Goals - 02/04/21 1454       PT LONG TERM GOAL #1   Title Pt will dem 5x sit to stand <20 seconds without HHA to improve functional strength with trasnfers    Baseline 33 sec (eval)    Time 8    Period Weeks    Status New      PT LONG TERM GOAL #2   Title Pt will be  able to ambulate >1 m/s with LRAD to improve gait speed to improve community ambulation    Baseline 0.53m/s with st. cane (eval)    Time 8    Period Weeks    Status New      PT LONG TERM GOAL #3   Title Pt will be able to ambulate on grass for 200 feet without AD and SBA to improve community negotiation    Baseline not tried (Eval)    Time 8    Period Weeks    Status New      PT LONG TERM GOAL #4   Title Pt will improve FGA score to >/=19/30 in order to indicate dec fall risk.    Time 8    Period Weeks    Status New                   Plan - 02/11/21 4128     Clinical Impression Statement todays skilled session focused on static and dynamic balance. pt did well on solid ground. pt demo decreased confidence with balance.    Personal Factors and Comorbidities Comorbidity 3+;Time since onset of injury/illness/exacerbation;Transportation    Comorbidities Privous hx of stroke, DM, asthma    Examination-Activity Limitations Caring for Others;Carry;Lift;Squat;Stairs;Stand;Transfers;Bend    Examination-Participation Restrictions Church;Cleaning;Community Activity;Driving;Laundry;Medication Management;Meal Prep;Occupation;Shop;Yard Work    Stability/Clinical Decision Making Stable/Uncomplicated    Rehab Potential Good    PT Frequency 2x / week    PT Duration 8 weeks    PT Treatment/Interventions ADLs/Self Care Home Management;Aquatic Therapy;Cryotherapy;Electrical Stimulation;Moist Heat;Traction;Gait training;Stair training;Functional mobility training;Therapeutic activities;Therapeutic exercise;Balance training;Neuromuscular re-education;Cognitive remediation;Patient/family education;Orthotic Fit/Training;Manual techniques;Passive range of motion;Energy conservation;Visual/perceptual remediation/compensation;Joint Manipulations;Spinal Manipulations    PT Next Visit Plan Add to HEP for balance: work on non compliant surface with static standing balance, tandem stance/gait, vestibular  exercises-head motions, begin to work on speeding gait up with SPC.    Consulted and Agree with Plan of Care Patient             Patient will benefit from skilled therapeutic intervention in order to improve the following deficits and impairments:  Abnormal gait, Decreased  activity tolerance, Decreased balance, Decreased coordination, Decreased endurance, Decreased mobility, Decreased safety awareness, Decreased strength, Difficulty walking, Impaired UE functional use, Impaired vision/preception, Postural dysfunction, Improper body mechanics, Pain  Visit Diagnosis: Other abnormalities of gait and mobility  Neck pain  Hemiplegia and hemiparesis following cerebral infarction affecting right dominant side (HCC)  Muscle weakness (generalized)  Cerebrovascular accident (CVA) due to thrombosis of precerebral artery [facility])     Problem List Patient Active Problem List   Diagnosis Date Noted   Muscle cramps    Diabetes mellitus type II, controlled (HCC)    Cramp in limb    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   Controlled type 2 diabetes mellitus with complication, without long-term current use of insulin (HCC)    Vascular headache    Dyslipidemia    Cryptogenic stroke (HCC)    Expressive aphasia    Acute CVA (cerebrovascular accident) (HCC) 01/02/2021   Dizziness 12/18/2020   Tension headache 11/18/2020   Acute neck pain 11/18/2020   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   Splinter in skin, left foot 12/11/2019   Allergic rhinitis due to pollen 10/15/2019   Concentration deficit 08/14/2019   Non-cardiac chest pain 05/18/2019   Anxiety 04/13/2019   Weight 03/06/2019   Right flank pain 11/22/2018   Left shoulder pain 08/02/2018   Restless leg syndrome 12/29/2016   GERD (gastroesophageal reflux disease) 08/24/2016   Hemorrhoids 08/24/2016   Healthcare maintenance 08/24/2016   GAD (generalized anxiety disorder) 06/22/2016   Levoscoliosis 10/06/2015   Type 2 diabetes mellitus  (HCC) 09/08/2015   Fatigue 04/03/2015   History of stroke 03/14/2015   IBS (irritable bowel syndrome) 09/29/2014   Hyperlipidemia 09/29/2014   Essential hypertension 09/29/2014    Ileana Ladd, PT 02/11/2021, 9:33 AM  [facility] [address] [place], [place], [address] Phone: [phone]   Fax:  [phone]  Name: Marelly Wehrman MRN: [id number] Date of Birth: 10/17/1970

## 2021-02-11 NOTE — Patient Instructions (Signed)
Access Code: [email or web address] URL: [email or web address] Date: 02/11/2021 Prepared by: Kallie Edward  Exercises Putty Squeezes - 1 x daily - 7 x weekly - 3 sets - 10 reps Rolling Putty on Table - 1 x daily - 7 x weekly - 3 sets - 10 reps Thumb Opposition with Putty - 1 x daily - 7 x weekly - 3 sets - 10 reps Seated Finger MP Flexion with Putty - 1 x daily - 7 x weekly - 3 sets - 10 reps

## 2021-02-11 NOTE — Telephone Encounter (Signed)
Completed forms and placed on Joellen's desk for faxing.

## 2021-02-11 NOTE — Telephone Encounter (Signed)
Amber [name] came in and dropped off paperwork and stated that it would have to be completed by Friday.
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
## 2021-02-12 ENCOUNTER — Ambulatory Visit
Admission: RE | Admit: 2021-02-12 | Discharge: 2021-02-12 | Disposition: A | Discharge status: Home / Self Care | Payer: BC Managed Care – PPO | Source: Ambulatory Visit | Attending: Internal Medicine | Admitting: Internal Medicine

## 2021-02-12 ENCOUNTER — Other Ambulatory Visit: Payer: Self-pay

## 2021-02-12 DIAGNOSIS — R072 Precordial pain: Secondary | ICD-10-CM | POA: Insufficient documentation

## 2021-02-12 MED ORDER — METOPROLOL TARTRATE 5 MG/5ML IV SOLN: 10.0000 mg | Freq: Once | INTRAVENOUS | Status: DC

## 2021-02-12 MED ORDER — NITROGLYCERIN 0.4 MG SL SUBL
0.8000 mg | SUBLINGUAL_TABLET | Freq: Once | SUBLINGUAL | Status: AC
  Administered 2021-02-12: 0.8 mg via SUBLINGUAL

## 2021-02-12 MED ORDER — IOHEXOL 350 MG/ML SOLN
85.0000 mL | Freq: Once | INTRAVENOUS | Status: AC | PRN
  Administered 2021-02-12: 85 mL via INTRAVENOUS

## 2021-02-12 NOTE — Telephone Encounter (Signed)
FYI, on your desk.

## 2021-02-12 NOTE — Telephone Encounter (Signed)
Has been faxed sent patient my chart to let know.

## 2021-02-12 NOTE — Progress Notes (Signed)
Patient tolerated procedure well. Ambulate w/o difficulty (with cane - her normal). Denies light headedness or being dizzy. Sitting in chair drinking water provided. Encouraged to drink extra water today and reasoning explained. Verbalized understanding. All questions answered. ABC intact. No further needs. Discharge from procedure area w/o issues.

## 2021-02-13 ENCOUNTER — Ambulatory Visit: Payer: BC Managed Care – PPO | Admitting: Occupational Therapy

## 2021-02-13 ENCOUNTER — Ambulatory Visit: Payer: BC Managed Care – PPO

## 2021-02-13 ENCOUNTER — Encounter: Payer: Self-pay | Admitting: Occupational Therapy

## 2021-02-13 DIAGNOSIS — R41840 Attention and concentration deficit: Secondary | ICD-10-CM

## 2021-02-13 DIAGNOSIS — R278 Other lack of coordination: Secondary | ICD-10-CM

## 2021-02-13 DIAGNOSIS — M6281 Muscle weakness (generalized): Secondary | ICD-10-CM

## 2021-02-13 DIAGNOSIS — R482 Apraxia: Secondary | ICD-10-CM

## 2021-02-13 DIAGNOSIS — R41842 Visuospatial deficit: Secondary | ICD-10-CM

## 2021-02-13 DIAGNOSIS — R2689 Other abnormalities of gait and mobility: Secondary | ICD-10-CM

## 2021-02-13 DIAGNOSIS — I69351 Hemiplegia and hemiparesis following cerebral infarction affecting right dominant side: Secondary | ICD-10-CM

## 2021-02-13 DIAGNOSIS — I6300 Cerebral infarction due to thrombosis of unspecified precerebral artery: Secondary | ICD-10-CM

## 2021-02-13 DIAGNOSIS — R41844 Frontal lobe and executive function deficit: Secondary | ICD-10-CM

## 2021-02-13 DIAGNOSIS — R4701 Aphasia: Secondary | ICD-10-CM

## 2021-02-13 DIAGNOSIS — M542 Cervicalgia: Secondary | ICD-10-CM
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
## 2021-02-13 NOTE — Therapy (Signed)
[facility] [address] [place], [place], [address] Phone: [phone]   Fax:  [phone]  Speech Language Pathology Treatment  Patient Details  Name: Amber [name] MRN: [id number] Date of Birth: 1970/07/16 Referring Provider (SLP): Mariam Dollar PA   Encounter Date: 02/13/2021   End of Session - 02/13/21 0806     Visit Number 4    Number of Visits 25    Date for SLP Re-Evaluation 04/22/21    Authorization Type BCBS medicaid - 30 VL all disciplines. . According to notes, private insurance will kick in after medicaid is exhausted, double check this when she has reached 10 ST visits or 30 mulit D visits.    Authorization - Visit Number 4    Authorization - Number of Visits 10    SLP Start Time [phone]   pt arrived late   SLP Stop Time  0845    SLP Time Calculation (min) 39 min    Activity Tolerance Patient tolerated treatment well             Past Medical History:  Diagnosis Date   Anxiety    self reported   Asthma    Depression    controlled   Diabetes mellitus without complication (HCC)    Hyperlipidemia    controlled with medication   IBS (irritable bowel syndrome)    Slurred speech    Stroke (HCC) 03/13/2015   Tension headache 11/18/2020   Word finding difficulty 02/27/2018    Past Surgical History:  Procedure Laterality Date   ABDOMINAL HYSTERECTOMY  10/2006   bowel reconstruction  10/2006   with hysterectomy   CESAREAN SECTION  2005   EP IMPLANTABLE DEVICE N/A 03/17/2015   Procedure: Loop Recorder Insertion;  Surgeon: Will Jorja Loa, MD;  [facility]: [facility];  Service: Cardiovascular;  Laterality: N/A;   TEE WITHOUT CARDIOVERSION N/A 03/17/2015   Procedure: TRANSESOPHAGEAL ECHOCARDIOGRAM (TEE);  Surgeon: Chilton Si, MD;  Location: [facility] ENDOSCOPY;  Service: Cardiovascular;  Laterality: N/A;   TEE WITHOUT CARDIOVERSION N/A 01/06/2021   Procedure: TRANSESOPHAGEAL ECHOCARDIOGRAM  (TEE);  Surgeon: [name], MD;  Location: [facility] ORS;  Service: Cardiovascular;  Laterality: N/A;   TOE SURGERY     Left 2nd metatarsal    There were no vitals filed for this visit.   Subjective Assessment - 02/13/21 0806     Subjective "I had a little procedure yesterday"    Currently in Pain? No/denies                   ADULT SLP TREATMENT - 02/13/21 0806       General Information   Behavior/Cognition Alert;Cooperative;Pleasant mood      Treatment Provided   Treatment provided Cognitive-Linquistic      Cognitive-Linquistic Treatment   Treatment focused on Apraxia;Aphasia;Patient/family/caregiver education    Skilled Treatment Pt verbally recalled targeted activities in ST sessions with good accuracy. Pt is utilizing pill organizer and using daily to-do lists/journal, although pt endorsed difficulty with attention and usual distractability. Occasional distractability noted in converation today (cleaning glasses and opening water bottle). Conversational speech exhibited intermittent mild delays and occasional pausing. Pt exhibited good awareness of increased rate of speech, in which pt usually able to self-correct rate. SLP targeted reading aloud at sentence level, in which pt benefited from breathing between sentences to reduce any tension and aid flow of speech. SLP reviewed using alarms/timers to balance rest and activities     Assessment / Recommendations /  Plan   Plan Continue with current plan of care      Progression Toward Goals   Progression toward goals Progressing toward goals              SLP Education - 02/13/21 1126     Education Details HEP, using alarms/timers to balance rest and activities, breathing to reduce tension and internal pressure    Person(s) Educated Patient    Methods Explanation;Demonstration;Verbal cues    Comprehension Verbalized understanding;Returned demonstration              SLP Short Term Goals - 02/13/21 4034        SLP SHORT TERM GOAL #1   Title Pt will complete HEP for verbal apraxia with occasional min A over 2 sessions    Baseline No HEP    Status Achieved      SLP SHORT TERM GOAL #2   Title Pt will name 15 items in persoanlly relevant category with rare min A    Baseline 12 items;   02/11/21    Time 5    Period Weeks    Status On-going      SLP SHORT TERM GOAL #3   Title Pt will use pill organizer to independenlty manage medications over 1 weeks with mod I    Baseline daughter and mom A pt with meds, no pill organizer    Status Achieved      SLP SHORT TERM GOAL #4   Title Pt will demonstrate 4 or less dysfluencies in 8 minute conversatoin with rare min A    Baseline 8 dysfluencies    Time 5    Period Weeks    Status On-going              SLP Long Term Goals - 02/13/21 0839       SLP LONG TERM GOAL #1   Title Pt will carryover verbal compensatory strategies for word finding episodes as needed in 15 minute conversation with rare min A    Baseline no compensatory strategies    Time 11    Period Weeks    Status On-going      SLP LONG TERM GOAL #2   Title Pt will demonstrate fluent speech over 15 minute converation with rare min A over 2 sessions    Baseline Pt dysfluent at sentence level    Time 11    Period Weeks    Status On-going      SLP LONG TERM GOAL #3   Title Pt will utilize calendar and external aids to manage appointments, schedule, to do list with mod I over 2 sessions    Baseline daughter and mom managing her schedule/appointments    Time 11    Period Weeks    Status On-going      SLP LONG TERM GOAL #4   Title Pt will double check and correct texts prior to sending then with supervision cues    Baseline Pt sending error and non sensical texts without awareness    Time 11    Period Weeks    Status On-going              Plan - 02/13/21 0839     Clinical Impression Statement Amber [name] continues to demonstrate ability to speak with increased rate  of speech and she is self correcting errors and adjusting rate slower as needed. Rate of speech while reading was benefited from more frequent breathing breaks to reduce tension and internal pressure to  perform accurately. SLP discussed energy conseveration techniques and external aids to manage time and reduce distractions. Continue skilled ST to maximize intelligibility, communication and cognition for safety, independence, QOL and possible return to work in some capactiy    Speech Therapy Frequency 2x / week    Duration 12 weeks    Treatment/Interventions Language facilitation;Environmental controls;Cueing hierarchy;SLP instruction and feedback;Compensatory strategies;Functional tasks;Cognitive reorganization;Compensatory techniques;Patient/family education;Multimodal communcation approach;Internal/external aids    Potential to Achieve Goals Good    Consulted and Agree with Plan of Care Patient             Patient will benefit from skilled therapeutic intervention in order to improve the following deficits and impairments:   Verbal apraxia  Aphasia    Problem List Patient Active Problem List   Diagnosis Date Noted   Muscle cramps    Diabetes mellitus type II, controlled (HCC)    Cramp in limb    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   Controlled type 2 diabetes mellitus with complication, without long-term current use of insulin (HCC)    Vascular headache    Dyslipidemia    Cryptogenic stroke (HCC)    Expressive aphasia    Acute CVA (cerebrovascular accident) (HCC) 01/02/2021   Dizziness 12/18/2020   Tension headache 11/18/2020   Acute neck pain 11/18/2020   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   Splinter in skin, left foot 12/11/2019   Allergic rhinitis due to pollen 10/15/2019   Concentration deficit 08/14/2019   Non-cardiac chest pain 05/18/2019   Anxiety 04/13/2019   Weight 03/06/2019   Right flank pain 11/22/2018   Left shoulder pain 08/02/2018   Restless leg syndrome  12/29/2016   GERD (gastroesophageal reflux disease) 08/24/2016   Hemorrhoids 08/24/2016   Healthcare maintenance 08/24/2016   GAD (generalized anxiety disorder) 06/22/2016   Levoscoliosis 10/06/2015   Type 2 diabetes mellitus (HCC) 09/08/2015   Fatigue 04/03/2015   History of stroke 03/14/2015   IBS (irritable bowel syndrome) 09/29/2014   Hyperlipidemia 09/29/2014   Essential hypertension 09/29/2014    Janann Colonel, MA CCC-SLP 02/13/2021, 11:29 AM  [facility] [address] [place], [place], [address] Phone: [phone]   Fax:  [phone]   Name: Amber [name] MRN: [id number] Date of Birth: 06-12-1970

## 2021-02-13 NOTE — Therapy (Signed)
[facility] [address] [place], [place], [address] Phone: [phone]   Fax:  [phone]  Physical Therapy Treatment  Patient Details  Name: Amber [name] MRN: [id number] Date of Birth: 27-Feb-1971 Referring Provider (PT): Cruzita Lederer, PA-C   Encounter Date: 02/13/2021   PT End of Session - 02/13/21 0938     Visit Number 4    Number of Visits 17    Date for PT Re-Evaluation 03/25/21    Authorization Type BCBS primary; [employer] MCD secondary (no auth required)    PT Start Time 0930    PT Stop Time 1015    PT Time Calculation (min) 45 min    Equipment Utilized During Treatment Gait belt    Activity Tolerance Patient tolerated treatment well    Behavior During Therapy [facility] for tasks assessed/performed             Past Medical History:  Diagnosis Date   Anxiety    self reported   Asthma    Depression    controlled   Diabetes mellitus without complication (HCC)    Hyperlipidemia    controlled with medication   IBS (irritable bowel syndrome)    Slurred speech    Stroke (HCC) 03/13/2015   Tension headache 11/18/2020   Word finding difficulty 02/27/2018    Past Surgical History:  Procedure Laterality Date   ABDOMINAL HYSTERECTOMY  10/2006   bowel reconstruction  10/2006   with hysterectomy   CESAREAN SECTION  2005   EP IMPLANTABLE DEVICE N/A 03/17/2015   Procedure: Loop Recorder Insertion;  Surgeon: Will Jorja Loa, MD;  Location: [facility] INVASIVE CV LAB;  Service: Cardiovascular;  Laterality: N/A;   TEE WITHOUT CARDIOVERSION N/A 03/17/2015   Procedure: TRANSESOPHAGEAL ECHOCARDIOGRAM (TEE);  Surgeon: Chilton Si, MD;  Location: [facility] ENDOSCOPY;  Service: Cardiovascular;  Laterality: N/A;   TEE WITHOUT CARDIOVERSION N/A 01/06/2021   Procedure: TRANSESOPHAGEAL ECHOCARDIOGRAM (TEE);  Surgeon: [name], MD;  Location: [facility] ORS;  Service: Cardiovascular;  Laterality: N/A;   TOE SURGERY     Left 2nd  metatarsal    There were no vitals filed for this visit.    Functional reaching: On floor: Reaching OH bil: 10x Toe touching: 10x On wobble board: AP rock: OH reaching bil: 5x AP rock: Toe touching: 5x ML rock: trukn twist with contralateral UE reach: 10x R and L Gait training: - walking on grass: 200 feet with st. Cane, cues to look down, smaller steps, wider BOS - walking on grass: 200 feet with st. Cane, with cues to count backwards from 3 by 40    [facility] PT Assessment - 02/13/21 0001       Standardized Balance Assessment   Standardized Balance Assessment Timed Up and Go Test      Timed Up and Go Test   Normal TUG (seconds) 14   with st. cane   Cognitive TUG (seconds) 32   with st. cane and counting bwd by 3s from 30, pt only able to count 2/3 numbers correctly (only able to count up to 21)                                     PT Short Term Goals - 01/28/21 1200       PT SHORT TERM GOAL #1   Title Pt will be able to ambulate 400 feet with st. cane on grass to navigate community  Baseline NOt tried (eval)    Time 4    Period Weeks    Status New    Target Date 02/25/21      PT SHORT TERM GOAL #2   Title Pt will demo 5x sit to stand without HHA in under <25 seconds to improve overall functional strength    Baseline 33 sec (eval)    Time 4    Period Weeks    Status New    Target Date 02/25/21      PT SHORT TERM GOAL #3   Title Patient will be able to go up and down stairs with reciprocal steps with use of 1 hand rail to improve navigating steps at home    Baseline step to pattern with rail (eval(    Time 4    Period Weeks    Status New    Target Date 02/25/21               PT Long Term Goals - 02/04/21 1454       PT LONG TERM GOAL #1   Title Pt will dem 5x sit to stand <20 seconds without HHA to improve functional strength with trasnfers    Baseline 33 sec (eval)    Time 8    Period Weeks    Status New      PT LONG  TERM GOAL #2   Title Pt will be able to ambulate >1 m/s with LRAD to improve gait speed to improve community ambulation    Baseline 0.21m/s with st. cane (eval)    Time 8    Period Weeks    Status New      PT LONG TERM GOAL #3   Title Pt will be able to ambulate on grass for 200 feet without AD and SBA to improve community negotiation    Baseline not tried (Eval)    Time 8    Period Weeks    Status New      PT LONG TERM GOAL #4   Title Pt will improve FGA score to >/=19/30 in order to indicate dec fall risk.    Time 8    Period Weeks    Status New                   Plan - 02/13/21 1019     Clinical Impression Statement Pt demonstrated decreased required A with functional reaching on wobble board. Patient demonstrated significant decline in functional mobility when cognitive task was involved with TUG.    Personal Factors and Comorbidities Comorbidity 3+;Time since onset of injury/illness/exacerbation;Transportation    Comorbidities Privous hx of stroke, DM, asthma    Examination-Activity Limitations Caring for Others;Carry;Lift;Squat;Stairs;Stand;Transfers;Bend    Examination-Participation Restrictions Church;Cleaning;Community Activity;Driving;Laundry;Medication Management;Meal Prep;Occupation;Shop;Yard Work    Stability/Clinical Decision Making Stable/Uncomplicated    Rehab Potential Good    PT Frequency 2x / week    PT Duration 8 weeks    PT Treatment/Interventions ADLs/Self Care Home Management;Aquatic Therapy;Cryotherapy;Electrical Stimulation;Moist Heat;Traction;Gait training;Stair training;Functional mobility training;Therapeutic activities;Therapeutic exercise;Balance training;Neuromuscular re-education;Cognitive remediation;Patient/family education;Orthotic Fit/Training;Manual techniques;Passive range of motion;Energy conservation;Visual/perceptual remediation/compensation;Joint Manipulations;Spinal Manipulations    PT Next Visit Plan Continue to add congnitive  tasks with functional mobility and balance and gait    Consulted and Agree with Plan of Care Patient             Patient will benefit from skilled therapeutic intervention in order to improve the following deficits and impairments:  Abnormal gait, Decreased activity tolerance, Decreased balance,  Decreased coordination, Decreased endurance, Decreased mobility, Decreased safety awareness, Decreased strength, Difficulty walking, Impaired UE functional use, Impaired vision/preception, Postural dysfunction, Improper body mechanics, Pain  Visit Diagnosis: Neck pain  Muscle weakness (generalized)  Cerebrovascular accident (CVA) due to thrombosis of precerebral artery (HCC)  Other abnormalities of gait and mobility     Problem List Patient Active Problem List   Diagnosis Date Noted   Muscle cramps    Diabetes mellitus type II, controlled (HCC)    Cramp in limb    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   Controlled type 2 diabetes mellitus with complication, without long-term current use of insulin (HCC)    Vascular headache    Dyslipidemia    Cryptogenic stroke (HCC)    Expressive aphasia    Acute CVA (cerebrovascular accident) (HCC) 01/02/2021   Dizziness 12/18/2020   Tension headache 11/18/2020   Acute neck pain 11/18/2020   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   Splinter in skin, left foot 12/11/2019   Allergic rhinitis due to pollen 10/15/2019   Concentration deficit 08/14/2019   Non-cardiac chest pain 05/18/2019   Anxiety 04/13/2019   Weight 03/06/2019   Right flank pain 11/22/2018   Left shoulder pain 08/02/2018   Restless leg syndrome 12/29/2016   GERD (gastroesophageal reflux disease) 08/24/2016   Hemorrhoids 08/24/2016   Healthcare maintenance 08/24/2016   GAD (generalized anxiety disorder) 06/22/2016   Levoscoliosis 10/06/2015   Type 2 diabetes mellitus (HCC) 09/08/2015   Fatigue 04/03/2015   History of stroke 03/14/2015   IBS (irritable bowel syndrome) 09/29/2014    Hyperlipidemia 09/29/2014   Essential hypertension 09/29/2014    Ileana Ladd, PT 02/13/2021, 10:21 AM  [facility] [address] [place], [place], [address] Phone: [phone]   Fax:  [phone]  Name: Amber [name] MRN: [id number] Date of Birth: [birth year]

## 2021-02-16 ENCOUNTER — Ambulatory Visit (INDEPENDENT_AMBULATORY_CARE_PROVIDER_SITE_OTHER): Payer: BC Managed Care – PPO | Admitting: Neurology

## 2021-02-16 ENCOUNTER — Ambulatory Visit: Payer: BC Managed Care – PPO | Admitting: Physician Assistant

## 2021-02-16 ENCOUNTER — Other Ambulatory Visit: Payer: Self-pay

## 2021-02-16 VITALS — BP 109/78 | HR 81 | Ht 62.0 in | Wt 172.0 lb

## 2021-02-16 DIAGNOSIS — I63412 Cerebral infarction due to embolism of left middle cerebral artery: Secondary | ICD-10-CM | POA: Diagnosis not present

## 2021-02-16 DIAGNOSIS — R4701 Aphasia: Secondary | ICD-10-CM

## 2021-02-16 DIAGNOSIS — I639 Cerebral infarction, unspecified: Secondary | ICD-10-CM

## 2021-02-16 NOTE — Progress Notes (Signed)
[facility] [address] [place]. [place] [address] [phone]       OFFICE CONSULT NOTE  Amber [name] Date of Birth:  [birth year] Medical Record Number:  [id number]   Referring MD: Alma Friendly, NP  Reason for Referral: Stroke  HPI: Amber [name] is a 50 year old Amber [name] with a past medical history significant for multiple remote cerebellar strokes, hyperlipidemia, anxiety, depression, asthma, tension headache who presented to the emergency department with acute onset expressive aphasia last known well was 1115 on 01/02/2021.  Amber [name] daughter noticed that Amber [name] was not making sense when Amber [name] spoke therefore Amber [name] called 911.  On Dr. Livia Snellen examination Amber [name] was able to follow commands however was unable to express any meaningful speech.  Amber [name] words were jumbled together in a word salad.  Amber [name] did not have any other significant deficits.  NIH stroke scale was a 4 (2 for questions and 2 for aphasia). Head CT showed NAICP. Amber [name] not on anticoagulation.  After discussion of risk benefits Amber [name] was given IV tPA uneventfully and showed improvement.  Amber [name] had some transient worsening of Amber [name] symptoms and CT scan of the head was repeated which showed no hemorrhagic conversion.  MRI scan of the brain personally reviewed showed acute infarction involving left middle frontal gyrus and left parietal.  A1c 6.2.  I discussed with milligrams percent.  CT angiogram of brain and neck did not show significant large vessel stenosis or occlusion transthoracic echo showed normal ejection fraction without intracardiac clot or PFO.  TEE also did not show PFO, clot or vegetation or cardiac source of embolism.  ESR was 5 mm.  HIV was negative.  Homocystine was normal anticardiolipin antibodies were negative.  ANA was negative.  Urine drug screen was negative.  Amber [name] had previously had a stroke in October 2016 also of cryptogenic etiology involving posterior left frontal cortical and subcortical white matter  as well as MRI at that time and also shown a previous small remote right parietal cortical infarct.  Amber [name] had a loop recorder inserted which did not show any paroxysmal A. fib and its battery ran out and is currently nonfunctional.  Amber [name] had been on aspirin and during the current admission it was switched to Plavix which is tolerating well without bruising or bleeding.  Amber [name] states Amber [name] speech is almost back to normal though when Amber [name] is excited or exhausted Amber [name] occasionally struggles for words.  Amber [name] still has some residual left-sided weakness and stiffness from a previous stroke.  ROS:   14 system review of systems is positive for speech difficulty, word finding difficulty, aphasia, stiffness of the left leg, difficulty walking all other systems negative  PMH:  Past Medical History:  Diagnosis Date   Anxiety    self reported   Asthma    Depression    controlled   Diabetes mellitus without complication ([place])    Hyperlipidemia    controlled with medication   IBS (irritable bowel syndrome)    Slurred speech    Stroke ([place]) 03/13/2015   Tension headache 06/Amber/2022   Word finding difficulty 02/27/2018    Social History:  Social History   Socioeconomic History   Marital status: Single    Spouse name: Not on file   Number of children: Not on file   Years of education: Not on file   Highest education level: Not on file  Occupational History   Not on file  Tobacco Use   Smoking status: Never   Smokeless tobacco: Never  Vaping  Use   Vaping Use: Never used  Substance and Sexual Activity   Alcohol use: Not Currently    Alcohol/week: 7.0 standard drinks    Types: 7 Glasses of wine per week    Comment: red wine occasionally but not since stroke   Drug use: Not Currently    Types: Marijuana    Comment: edibles-twice weekly   Sexual activity: Not Currently  Other Topics Concern   Not on file  Social History Narrative   Originally from [place] lives up here in [place]   Has one daughter.   Enjoys spending time shopping and spending time with family    Social Determinants of Health   Financial Resource Strain: Not on file  Food Insecurity: Not on file  Transportation Needs: Not on file  Physical Activity: Not on file  Stress: Not on file  Social Connections: Not on file  Intimate Partner Violence: Not on file    Medications:   Current Outpatient Medications on File Prior to Visit  Medication Sig Dispense Refill   acetaminophen (TYLENOL) 325 MG tablet Take 1-2 tablets (325-650 mg total) by mouth every 4 (four) hours as needed for mild pain.     albuterol (VENTOLIN HFA) 108 (90 Base) MCG/ACT inhaler Inhale 2 puffs into the lungs every 4 (four) hours as needed for wheezing or shortness of breath (cough, shortness of breath or wheezing.). 1 each 1   Ascorbic Acid (VITAMIN C) 1000 MG tablet Take 1 tablet (1,000 mg total) by mouth daily. 30 tablet 0   azelastine (ASTELIN) 0.1 % nasal spray Place 1 spray into both nostrils 2 (two) times daily. Use in each nostril as directed 30 mL 12   cholecalciferol (VITAMIN D3) 25 MCG (1000 UNIT) tablet Take 1 tablet (1,000 Units total) by mouth daily. 30 tablet 0   clopidogrel (PLAVIX) 75 MG tablet Take 1 tablet (75 mg total) by mouth daily. 30 tablet 0   famotidine (PEPCID) 20 MG tablet Take 1 tablet (20 mg total) by mouth 2 (two) times daily. For heartburn. 60 tablet 0   metFORMIN (GLUCOPHAGE-XR) 500 MG 24 hr tablet Take 500 mg by mouth daily with breakfast.     methocarbamol (ROBAXIN) 500 MG tablet Take 1 tablet (500 mg total) by mouth every 8 (eight) hours as needed for muscle spasms. 30 tablet 0   metoprolol succinate (TOPROL XL) 25 MG 24 hr tablet Take 1 tablet (25 mg total) by mouth daily. 30 tablet 6   metoprolol tartrate (LOPRESSOR) 100 MG tablet Take 1 tablet (100 mg) by mouth 2 hours prior to your Cardiac CT 1 tablet 0   Multiple Vitamins-Minerals (MULTIVITAMIN WITH MINERALS) tablet Take 1 tablet by  mouth daily.     oxyCODONE (OXY IR/ROXICODONE) 5 MG immediate release tablet Take 0.5 tablets (2.5 mg total) by mouth every 6 (six) hours as needed for severe pain. 30 tablet 0   pantoprazole (PROTONIX) 40 MG tablet Take 1 tablet (40 mg total) by mouth daily. 30 tablet 3   rosuvastatin (CRESTOR) 40 MG tablet Take 1 tablet (40 mg total) by mouth daily. For cholesterol. 90 tablet 3   topiramate (TOPAMAX) 25 MG tablet Take 25 mg by mouth daily as needed.     No current facility-administered medications on file prior to visit.    Allergies:   Allergies  Allergen Reactions   Keflex [Cephalexin] Rash    Physical Exam General: well developed, well nourished Amber [name], seated, in no  evident distress Head: head normocephalic and atraumatic.   Neck: supple with no carotid or supraclavicular bruits Cardiovascular: regular rate and rhythm, no murmurs Musculoskeletal: no deformity Skin:  no rash/petichiae Vascular:  Normal pulses all extremities  Neurologic Exam Mental Status: Awake and fully alert. Oriented to place and time. Recent and remote memory intact. Attention span, concentration and fund of knowledge appropriate. Mood and affect appropriate.  Speech nonfluent with occasional word finding difficulties and hesitancy.  No dysarthria.  Good comprehension, naming and repetition. Cranial Nerves: Fundoscopic exam reveals sharp disc margins. Pupils equal, briskly reactive to light. Extraocular movements full without nystagmus. Visual fields full to confrontation. Hearing intact. Facial sensation intact. Face, tongue, palate moves normally and symmetrically.  Motor: Normal bulk and tone. Normal strength in all tested extremity muscles except diminished fine finger movements on the left.  Mild left grip weakness.  Orbits right over left upper extremity.  Tone is increased in the left leg.. Sensory.: intact to touch , pinprick , position and vibratory sensation.  Coordination:  Rapid alternating movements normal in all extremities. Finger-to-nose and heel-to-shin performed accurately bilaterally. Gait and Station: Arises from chair without difficulty. Stance is normal. Gait demonstrates normal stride length and balance .  Drags left leg slightly while walking.  Able to heel, toe and tandem walk with  difficulty.  Reflexes: 1+ and symmetric. Toes downgoing.   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  Remote history of cryptogenic stroke in October 2016.  Vascular risk factors of diabetes, hypertension, hyperlipidemia.  Amber [name] has previously had prolonged cardiac monitoring with loop recorder which did not show paroxysmal A. fib till the end of its battery life .     PLAN: I had a long d/w Amber [name] and Amber [name] sister about Amber [name] recent cryptogenic stroke, risk for recurrent stroke/TIAs, personally independently reviewed imaging studies and stroke evaluation results and answered questions.Continue Plavix 75 mg daily for secondary stroke prevention and maintain strict control of hypertension with blood pressure goal below 130/90, diabetes with hemoglobin A1c goal below 6.5% and lipids with LDL cholesterol goal below 70 mg/dL. I also advised the Amber [name] to eat a healthy diet with plenty of whole grains, cereals, fruits and vegetables, exercise regularly and maintain ideal body weight.  Consider possible participation in the [place] stroke trial for stroke prevention if interested.  Followup in the future with my nurse practitioner Janett Billow in 3 months or call earlier if necessary.  Greater than 50% time during this 45-minute consultation visit was spent on counseling and coordination of care about recurrent cryptogenic stroke and answering questions. Antony Contras, MD  Note: This document was prepared with digital dictation and possible smart phrase  technology. Any transcriptional errors that result from this process are unintentional.

## 2021-02-16 NOTE — Patient Instructions (Addendum)
I had a long d/w patient and her sister about her recent cryptogenic stroke, risk for recurrent stroke/TIAs, personally independently reviewed imaging studies and stroke evaluation results and answered questions.Continue Plavix 75 mg daily for secondary stroke prevention and maintain strict control of hypertension with blood pressure goal below 130/90, diabetes with hemoglobin A1c goal below 6.5% and lipids with LDL cholesterol goal below 70 mg/dL. I also advised the patient to eat a healthy diet with plenty of whole grains, cereals, fruits and vegetables, exercise regularly and maintain ideal body weight.  Consider possible participation in the [place] stroke trial for stroke prevention if interested.  Followup in the future with my nurse practitioner Shanda Bumps in 3 months or call earlier if necessary. Stroke Prevention Some medical conditions and behaviors are associated with a higher chance of having a stroke. You can help prevent a stroke by making nutrition, lifestyle, and other changes, including managing any medical conditions you may have. What nutrition changes can be made?  Eat healthy foods. You can do this by: Choosing foods high in fiber, such as fresh fruits and vegetables and whole grains. Eating at least 5 or more servings of fruits and vegetables a day. Try to fill half of your plate at each meal with fruits and vegetables. Choosing lean protein foods, such as lean cuts of meat, poultry without skin, fish, tofu, beans, and nuts. Eating low-fat dairy products. Avoiding foods that are high in salt (sodium). This can help lower blood pressure. Avoiding foods that have saturated fat, trans fat, and cholesterol. This can help prevent high cholesterol. Avoiding processed and premade foods. Follow your health care provider's specific guidelines for losing weight, controlling high blood pressure (hypertension), lowering high cholesterol, and managing diabetes. These may include: Reducing your  daily calorie intake. Limiting your daily sodium intake to 1,500 milligrams (mg). Using only healthy fats for cooking, such as olive oil, canola oil, or sunflower oil. Counting your daily carbohydrate intake. What lifestyle changes can be made? Maintain a healthy weight. Talk to your health care provider about your ideal weight. Get at least 30 minutes of moderate physical activity at least 5 days a week. Moderate activity includes brisk walking, biking, and swimming. Do not use any products that contain nicotine or tobacco, such as cigarettes and e-cigarettes. If you need help quitting, ask your health care provider. It may also be helpful to avoid exposure to secondhand smoke. Limit alcohol intake to no more than 1 drink a day for nonpregnant women and 2 drinks a day for men. One drink equals 12 oz of beer, 5 oz of wine, or 1 oz of hard liquor. Stop any illegal drug use. Avoid taking birth control pills. Talk to your health care provider about the risks of taking birth control pills if: You are over 23 years old. You smoke. You get migraines. You have ever had a blood clot. What other changes can be made? Manage your cholesterol levels. Eating a healthy diet is important for preventing high cholesterol. If cholesterol cannot be managed through diet alone, you may also need to take medicines. Take any prescribed medicines to control your cholesterol as told by your health care provider. Manage your diabetes. Eating a healthy diet and exercising regularly are important parts of managing your blood sugar. If your blood sugar cannot be managed through diet and exercise, you may need to take medicines. Take any prescribed medicines to control your diabetes as told by your health care provider. Control your hypertension. To reduce your  risk of stroke, try to keep your blood pressure below 130/80. Eating a healthy diet and exercising regularly are an important part of controlling your blood  pressure. If your blood pressure cannot be managed through diet and exercise, you may need to take medicines. Take any prescribed medicines to control hypertension as told by your health care provider. Ask your health care provider if you should monitor your blood pressure at home. Have your blood pressure checked every year, even if your blood pressure is normal. Blood pressure increases with age and some medical conditions. Get evaluated for sleep disorders (sleep apnea). Talk to your health care provider about getting a sleep evaluation if you snore a lot or have excessive sleepiness. Take over-the-counter and prescription medicines only as told by your health care provider. Aspirin or blood thinners (antiplatelets or anticoagulants) may be recommended to reduce your risk of forming blood clots that can lead to stroke. Make sure that any other medical conditions you have, such as atrial fibrillation or atherosclerosis, are managed. What are the warning signs of a stroke? The warning signs of a stroke can be easily remembered as BEFAST. B is for balance. Signs include: Dizziness. Loss of balance or coordination. Sudden trouble walking. E is for eyes. Signs include: A sudden change in vision. Trouble seeing. F is for face. Signs include: Sudden weakness or numbness of the face. The face or eyelid drooping to one side. A is for arms. Signs include: Sudden weakness or numbness of the arm, usually on one side of the body. S is for speech. Signs include: Trouble speaking (aphasia). Trouble understanding. T is for time. These symptoms may represent a serious problem that is an emergency. Do not wait to see if the symptoms will go away. Get medical help right away. Call your local emergency services (911 in the [place] Do not drive yourself to the hospital. Other signs of stroke may include: A sudden, severe headache with no known cause. Nausea or vomiting. Seizure. Where to find more  information For more information, visit: American Stroke Association: www.strokeassociation.org National Stroke Association: www.stroke.org Summary You can prevent a stroke by eating healthy, exercising, not smoking, limiting alcohol intake, and managing any medical conditions you may have. Do not use any products that contain nicotine or tobacco, such as cigarettes and e-cigarettes. If you need help quitting, ask your health care provider. It may also be helpful to avoid exposure to secondhand smoke. Remember BEFAST for warning signs of stroke. Get help right away if you or a loved one has any of these signs. This information is not intended to replace advice given to you by your health care provider. Make sure you discuss any questions you have with your health care provider. Document Revised: 04/29/2017 Document Reviewed: 06/22/2016 Elsevier Patient Education  2021 [employer].

## 2021-02-17 ENCOUNTER — Encounter: Payer: BC Managed Care – PPO | Admitting: Occupational Therapy

## 2021-02-17 ENCOUNTER — Other Ambulatory Visit: Payer: Self-pay | Admitting: Primary Care

## 2021-02-17 ENCOUNTER — Ambulatory Visit: Payer: BC Managed Care – PPO | Admitting: Rehabilitation

## 2021-02-17 ENCOUNTER — Telehealth: Payer: Self-pay | Admitting: Internal Medicine

## 2021-02-17 DIAGNOSIS — E785 Hyperlipidemia, unspecified: Secondary | ICD-10-CM

## 2021-02-17 NOTE — Telephone Encounter (Signed)
Patient calling to check on status of results °

## 2021-02-17 NOTE — Telephone Encounter (Signed)
The patient has been notified of the result and verbalized understanding.  All questions (if any) were answered. Pt will follow up as scheduled 03/11/21.

## 2021-02-18 ENCOUNTER — Encounter: Payer: Self-pay | Admitting: Occupational Therapy

## 2021-02-18 ENCOUNTER — Other Ambulatory Visit: Payer: Self-pay

## 2021-02-18 ENCOUNTER — Ambulatory Visit: Payer: BC Managed Care – PPO | Admitting: Occupational Therapy

## 2021-02-18 ENCOUNTER — Encounter: Payer: Self-pay | Admitting: Rehabilitation

## 2021-02-18 ENCOUNTER — Ambulatory Visit: Payer: BC Managed Care – PPO

## 2021-02-18 ENCOUNTER — Ambulatory Visit: Payer: BC Managed Care – PPO | Admitting: Rehabilitation

## 2021-02-18 DIAGNOSIS — I69351 Hemiplegia and hemiparesis following cerebral infarction affecting right dominant side: Secondary | ICD-10-CM

## 2021-02-18 DIAGNOSIS — M6281 Muscle weakness (generalized): Secondary | ICD-10-CM

## 2021-02-18 DIAGNOSIS — R41842 Visuospatial deficit: Secondary | ICD-10-CM

## 2021-02-18 DIAGNOSIS — R41844 Frontal lobe and executive function deficit: Secondary | ICD-10-CM

## 2021-02-18 DIAGNOSIS — R41840 Attention and concentration deficit: Secondary | ICD-10-CM

## 2021-02-18 DIAGNOSIS — R482 Apraxia: Secondary | ICD-10-CM

## 2021-02-18 DIAGNOSIS — R41841 Cognitive communication deficit: Secondary | ICD-10-CM

## 2021-02-18 DIAGNOSIS — R4701 Aphasia: Secondary | ICD-10-CM

## 2021-02-18 DIAGNOSIS — R2689 Other abnormalities of gait and mobility: Secondary | ICD-10-CM

## 2021-02-18 DIAGNOSIS — R278 Other lack of coordination: Secondary | ICD-10-CM
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
## 2021-02-18 NOTE — Therapy (Signed)
[facility] [address] [place], [place], [address] Phone: [phone]   Fax:  [phone]  Speech Language Pathology Treatment  Patient Details  Name: Amber [name] MRN: [id number] Date of Birth: [birth year] Referring Provider (SLP): Mariam Dollar PA   Encounter Date: 02/18/2021   End of Session - 02/18/21 1100     Visit Number 5    Number of Visits 25    Date for SLP Re-Evaluation 04/22/21    Authorization Type BCBS medicaid - 30 VL all disciplines. . According to notes, private insurance will kick in after medicaid is exhausted, double check this when she has reached 10 ST visits or 30 mulit D visits.    Authorization - Visit Number 5    Authorization - Number of Visits 10    SLP Start Time 1100    SLP Stop Time  1145    SLP Time Calculation (min) 45 min    Activity Tolerance Patient tolerated treatment well             Past Medical History:  Diagnosis Date   Anxiety    self reported   Asthma    Depression    controlled   Diabetes mellitus without complication (HCC)    Hyperlipidemia    controlled with medication   IBS (irritable bowel syndrome)    Slurred speech    Stroke (HCC) 03/13/2015   Tension headache 11/18/2020   Word finding difficulty 02/27/2018    Past Surgical History:  Procedure Laterality Date   ABDOMINAL HYSTERECTOMY  10/2006   bowel reconstruction  10/2006   with hysterectomy   CESAREAN SECTION  2005   EP IMPLANTABLE DEVICE N/A 03/17/2015   Procedure: Loop Recorder Insertion;  Surgeon: Will Jorja Loa, MD;  [facility]: [facility] INVASIVE CV LAB;  Service: Cardiovascular;  Laterality: N/A;   TEE WITHOUT CARDIOVERSION N/A 03/17/2015   Procedure: TRANSESOPHAGEAL ECHOCARDIOGRAM (TEE);  Surgeon: Chilton Si, MD;  Location: [facility] ENDOSCOPY;  Service: Cardiovascular;  Laterality: N/A;   TEE WITHOUT CARDIOVERSION N/A 01/06/2021   Procedure: TRANSESOPHAGEAL ECHOCARDIOGRAM (TEE);  Surgeon:  [name], MD;  Location: [facility] ORS;  Service: Cardiovascular;  Laterality: N/A;   TOE SURGERY     Left 2nd metatarsal    There were no vitals filed for this visit.   Subjective Assessment - 02/18/21 1101     Subjective "I did a little bit of homework"    Currently in Pain? No/denies                   ADULT SLP TREATMENT - 02/18/21 1100       General Information   Behavior/Cognition Alert;Cooperative;Pleasant mood      Treatment Provided   Treatment provided Cognitive-Linquistic      Cognitive-Linquistic Treatment   Treatment focused on Apraxia;Aphasia;Patient/family/caregiver education    Skilled Treatment Pt was very verbose this session. Pt able to self-correct apraxic articulation errors and fast rate x5 in conversation. Pt independently utilized synonym strategy x1 in conversation. Pt endorsed difficulty with getting distracted and staying on task. SLP educated patient on impact of internal distractions and provided recommendations to aid attention, such as using timers, double checking work, and taking breaks. Pt noted to have forgotten to complete last item at end of page on two HEP tasks, which SLP highlighted to aid attention and awareness. Pt named 15 items in category of favorite breakfast items and television shows with occasional extended time.      Assessment / Recommendations /  Plan   Plan Continue with current plan of care      Progression Toward Goals   Progression toward goals Progressing toward goals              SLP Education - 02/18/21 1254     Education Details monitor rate, positive use of compensations for anomia, attention strategies    Person(s) Educated Patient    Methods Explanation;Demonstration;Verbal cues    Comprehension Verbalized understanding;Returned demonstration;Verbal cues required;Need further instruction              SLP Short Term Goals - 02/18/21 1256       SLP SHORT TERM GOAL #1   Title Pt will complete  HEP for verbal apraxia with occasional min A over 2 sessions    Baseline No HEP    Status Achieved      SLP SHORT TERM GOAL #2   Title Pt will name 15 items in persoanlly relevant category with rare min A    Baseline 12 items;   02/11/21, 02/18/21    Status Achieved      SLP SHORT TERM GOAL #3   Title Pt will use pill organizer to independenlty manage medications over 1 weeks with mod I    Baseline daughter and mom A pt with meds, no pill organizer    Status Achieved      SLP SHORT TERM GOAL #4   Title Pt will demonstrate 4 or less dysfluencies in 8 minute conversatoin with rare min A    Baseline 8 dysfluencies    Time 5    Period Weeks    Status On-going              SLP Long Term Goals - 02/18/21 1256       SLP LONG TERM GOAL #1   Title Pt will carryover verbal compensatory strategies for word finding episodes as needed in 15 minute conversation with rare min A    Baseline no compensatory strategies; 02-18-21    Time 10    Period Weeks    Status On-going      SLP LONG TERM GOAL #2   Title Pt will demonstrate fluent speech over 15 minute converation with rare min A over 2 sessions    Baseline Pt dysfluent at sentence level    Time 10    Period Weeks    Status On-going      SLP LONG TERM GOAL #3   Title Pt will utilize calendar and external aids to manage appointments, schedule, to do list with mod I over 2 sessions    Baseline daughter and mom managing her schedule/appointments    Time 10    Period Weeks    Status On-going      SLP LONG TERM GOAL #4   Title Pt will double check and correct texts prior to sending then with supervision cues    Baseline Pt sending error and non sensical texts without awareness    Time 10    Period Weeks    Status On-going              Plan - 02/18/21 1128     Clinical Impression Statement [name] demonstrated ability to speak with increased rate of speech and she is self correcting errors and adjusting rate slower as needed.  Pt effectively used synonym strategy x1 in conversation when anomia occured. SLP discussed techniques to aid attention and external aids to manage time and reduce distractions. Continue skilled ST to  maximize intelligibility, communication and cognition for safety, independence, QOL and possible return to work in some capactiy    Speech Therapy Frequency 2x / week    Duration 12 weeks    Treatment/Interventions Language facilitation;Environmental controls;Cueing hierarchy;SLP instruction and feedback;Compensatory strategies;Functional tasks;Cognitive reorganization;Compensatory techniques;Patient/family education;Multimodal communcation approach;Internal/external aids    Potential to Achieve Goals Good    Consulted and Agree with Plan of Care Patient             Patient will benefit from skilled therapeutic intervention in order to improve the following deficits and impairments:   Aphasia  Verbal apraxia  Cognitive communication deficit    Problem List Patient Active Problem List   Diagnosis Date Noted   Muscle cramps    Diabetes mellitus type II, controlled (HCC)    Cramp in limb    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   Controlled type 2 diabetes mellitus with complication, without long-term current use of insulin (HCC)    Vascular headache    Dyslipidemia    Cryptogenic stroke (HCC)    Expressive aphasia    Acute CVA (cerebrovascular accident) (HCC) 01/02/2021   Dizziness 12/18/2020   Tension headache 11/18/2020   Acute neck pain 11/18/2020   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   Splinter in skin, left foot 12/11/2019   Allergic rhinitis due to pollen 10/15/2019   Concentration deficit 08/14/2019   Non-cardiac chest pain 05/18/2019   Anxiety 04/13/2019   Weight 03/06/2019   Right flank pain 11/22/2018   Left shoulder pain 08/02/2018   Restless leg syndrome 12/29/2016   GERD (gastroesophageal reflux disease) 08/24/2016   Hemorrhoids 08/24/2016   Healthcare maintenance  08/24/2016   GAD (generalized anxiety disorder) 06/22/2016   Levoscoliosis 10/06/2015   Type 2 diabetes mellitus (HCC) 09/08/2015   Fatigue 04/03/2015   History of stroke 03/14/2015   IBS (irritable bowel syndrome) 09/29/2014   Hyperlipidemia 09/29/2014   Essential hypertension 09/29/2014    Janann Colonel, MA CCC-SLP 02/18/2021, 12:58 PM  [facility] [address] [place], [place], [address] Phone: [phone]   Fax:  [phone]   Name: Jahnasia Tatum MRN: [id number] Date of Birth: [birth year]

## 2021-02-18 NOTE — Therapy (Signed)
[facility] [address] [place], [place], [address] Phone: [phone]   Fax:  [phone]  Physical Therapy Treatment  Patient Details  Name: Amber [name] MRN: [id number] Date of Birth: [birth year] Referring Provider (PT): Cruzita Lederer, PA-C   Encounter Date: 02/18/2021   PT End of Session - 02/18/21 2038     Visit Number 5    Number of Visits 17    Date for PT Re-Evaluation 03/25/21    Authorization Type BCBS primary; UHC MCD secondary (no auth required)    PT Start Time 1149    PT Stop Time 1231    PT Time Calculation (min) 42 min    Equipment Utilized During Treatment Gait belt    Activity Tolerance Patient tolerated treatment well    Behavior During Therapy WFL for tasks assessed/performed             Past Medical History:  Diagnosis Date   Anxiety    self reported   Asthma    Depression    controlled   Diabetes mellitus without complication (HCC)    Hyperlipidemia    controlled with medication   IBS (irritable bowel syndrome)    Slurred speech    Stroke (HCC) 03/13/2015   Tension headache 11/18/2020   Word finding difficulty 02/27/2018    Past Surgical History:  Procedure Laterality Date   ABDOMINAL HYSTERECTOMY  10/2006   bowel reconstruction  10/2006   with hysterectomy   CESAREAN SECTION  2005   EP IMPLANTABLE DEVICE N/A 03/17/2015   Procedure: Loop Recorder Insertion;  Surgeon: Will Jorja Loa, MD;  [facility]: [facility];  Service: Cardiovascular;  Laterality: N/A;   TEE WITHOUT CARDIOVERSION N/A 03/17/2015   Procedure: TRANSESOPHAGEAL ECHOCARDIOGRAM (TEE);  Surgeon: Chilton Si, MD;  Location: [facility] ENDOSCOPY;  Service: Cardiovascular;  Laterality: N/A;   TEE WITHOUT CARDIOVERSION N/A 01/06/2021   Procedure: TRANSESOPHAGEAL ECHOCARDIOGRAM (TEE);  Surgeon: [name], MD;  Location: [facility] ORS;  Service: Cardiovascular;  Laterality: N/A;   TOE SURGERY     Left 2nd  metatarsal    There were no vitals filed for this visit.   Subjective Assessment - 02/18/21 1150     Subjective Pt reports no changes since last visit. Cardiac CT was normal.  Went to Dr. Pearlean Brownie on Monday and they discussed getting her on blood thinner.    Pertinent History history of CVA in 2016 and 2022 , type 2 diabetes, hypertension, hyperlipidemia    Limitations Standing;Walking;House hold activities    Currently in Pain? No/denies                               [facility] Adult PT Treatment/Exercise - 02/18/21 1200       Ambulation/Gait   Ambulation/Gait Yes    Ambulation/Gait Assistance 5: Supervision    Ambulation/Gait Assistance Details Into and out of clinic with cane.  Gait training at end of session with cane focusing on increased gait speed with larger stride length.  Pt did very well with min cues throughout.    Ambulation Distance (Feet) 500 Feet    Assistive device Straight cane    Gait Pattern Step-through pattern;Decreased arm swing - left;Decreased stride length;Trunk flexed    Ambulation Surface Level;Indoor      Neuro Re-ed    Neuro Re-ed Details  High level balance in // bars:  Standing on small rocker board biased vertically with feet apart maintaining  balance x 30 secs, adding alt UE flex x 10 reps, catching/tossing ball to volunteer x 10 reps>adding cognitive task with ball toss on rocker board (pt needing mod to max cues and increased time to complete cognitive task).  Standing on foam beam with feet apart EO x 20 secs, alt forward stepping x 10 reps, retro stepping x 10 reps, all with minimal UE support.  Wall bumps standing on foam beam with feet apart x 8 reps with heavy verbal cues and light tactile cues for improved hip extension.                       PT Short Term Goals - 01/28/21 1200       PT SHORT TERM GOAL #1   Title Pt will be able to ambulate 400 feet with st. cane on grass to navigate community    Baseline NOt tried  (eval)    Time 4    Period Weeks    Status New    Target Date 02/25/21      PT SHORT TERM GOAL #2   Title Pt will demo 5x sit to stand without HHA in under <25 seconds to improve overall functional strength    Baseline 33 sec (eval)    Time 4    Period Weeks    Status New    Target Date 02/25/21      PT SHORT TERM GOAL #3   Title Patient will be able to go up and down stairs with reciprocal steps with use of 1 hand rail to improve navigating steps at home    Baseline step to pattern with rail (eval(    Time 4    Period Weeks    Status New    Target Date 02/25/21               PT Long Term Goals - 02/04/21 1454       PT LONG TERM GOAL #1   Title Pt will dem 5x sit to stand <20 seconds without HHA to improve functional strength with trasnfers    Baseline 33 sec (eval)    Time 8    Period Weeks    Status New      PT LONG TERM GOAL #2   Title Pt will be able to ambulate >1 m/s with LRAD to improve gait speed to improve community ambulation    Baseline 0.85m/s with st. cane (eval)    Time 8    Period Weeks    Status New      PT LONG TERM GOAL #3   Title Pt will be able to ambulate on grass for 200 feet without AD and SBA to improve community negotiation    Baseline not tried (Eval)    Time 8    Period Weeks    Status New      PT LONG TERM GOAL #4   Title Pt will improve FGA score to >/=19/30 in order to indicate dec fall risk.    Time 8    Period Weeks    Status New                   Plan - 02/18/21 2038     Clinical Impression Statement Skilled session focused on high level balance on compliant surfaces with emphasis on hip and stepping strategy.  Pt tolerated well but needs increased time to complete tasks (esp when cognitive tasks incorporated) and cues for redirection to  task at times.    Personal Factors and Comorbidities Comorbidity 3+;Time since onset of injury/illness/exacerbation;Transportation    Comorbidities Privous hx of stroke, DM,  asthma    Examination-Activity Limitations Caring for Others;Carry;Lift;Squat;Stairs;Stand;Transfers;Bend    Examination-Participation Restrictions Church;Cleaning;Community Activity;Driving;Laundry;Medication Management;Meal Prep;Occupation;Shop;Yard Work    Stability/Clinical Decision Making Stable/Uncomplicated    Rehab Potential Good    PT Frequency 2x / week    PT Duration 8 weeks    PT Treatment/Interventions ADLs/Self Care Home Management;Aquatic Therapy;Cryotherapy;Electrical Stimulation;Moist Heat;Traction;Gait training;Stair training;Functional mobility training;Therapeutic activities;Therapeutic exercise;Balance training;Neuromuscular re-education;Cognitive remediation;Patient/family education;Orthotic Fit/Training;Manual techniques;Passive range of motion;Energy conservation;Visual/perceptual remediation/compensation;Joint Manipulations;Spinal Manipulations    PT Next Visit Plan Goals due 9/28!! Continue to add congnitive tasks with functional mobility and balance and gait    Consulted and Agree with Plan of Care Patient             Patient will benefit from skilled therapeutic intervention in order to improve the following deficits and impairments:  Abnormal gait, Decreased activity tolerance, Decreased balance, Decreased coordination, Decreased endurance, Decreased mobility, Decreased safety awareness, Decreased strength, Difficulty walking, Impaired UE functional use, Impaired vision/preception, Postural dysfunction, Improper body mechanics, Pain  Visit Diagnosis: Muscle weakness (generalized)  Other abnormalities of gait and mobility     Problem List Patient Active Problem List   Diagnosis Date Noted   Muscle cramps    Diabetes mellitus type II, controlled (HCC)    Cramp in limb    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   Controlled type 2 diabetes mellitus with complication, without long-term current use of insulin (HCC)    Vascular headache    Dyslipidemia     Cryptogenic stroke (HCC)    Expressive aphasia    Acute CVA (cerebrovascular accident) (HCC) 01/02/2021   Dizziness 12/18/2020   Tension headache 11/18/2020   Acute neck pain 11/18/2020   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   Splinter in skin, left foot 12/11/2019   Allergic rhinitis due to pollen 10/15/2019   Concentration deficit 08/14/2019   Non-cardiac chest pain 05/18/2019   Anxiety 04/13/2019   Weight 03/06/2019   Right flank pain 11/22/2018   Left shoulder pain 08/02/2018   Restless leg syndrome 12/29/2016   GERD (gastroesophageal reflux disease) 08/24/2016   Hemorrhoids 08/24/2016   Healthcare maintenance 08/24/2016   GAD (generalized anxiety disorder) 06/22/2016   Levoscoliosis 10/06/2015   Type 2 diabetes mellitus (HCC) 09/08/2015   Fatigue 04/03/2015   History of stroke 03/14/2015   IBS (irritable bowel syndrome) 09/29/2014   Hyperlipidemia 09/29/2014   Essential hypertension 09/29/2014    Harriet Butte, PT, MPT [facility] [address] [place], [place], [address] Phone: [phone]   Fax:  [phone] 02/18/21, 8:41 PM   Name: Asako Saliba MRN: [id number] Date of Birth: 1971/05/17

## 2021-02-18 NOTE — Patient Instructions (Addendum)
Consider internal distractions - worry, pain, fatigue   -ask questions in conversation   -use eye contact (if your eyes are wandering, your mind might be wandering)  Be aware if you have completed the task (did you attend to all the items? Did you lose focus during the task?)  Double check your work to ensure accuracy   Set timers to help you focus on one task at a time

## 2021-02-20 ENCOUNTER — Encounter: Payer: Self-pay | Admitting: Occupational Therapy

## 2021-02-20 ENCOUNTER — Ambulatory Visit: Payer: BC Managed Care – PPO

## 2021-02-20 ENCOUNTER — Ambulatory Visit: Payer: BC Managed Care – PPO | Admitting: Occupational Therapy

## 2021-02-20 ENCOUNTER — Other Ambulatory Visit: Payer: Self-pay

## 2021-02-20 DIAGNOSIS — M6281 Muscle weakness (generalized): Secondary | ICD-10-CM

## 2021-02-20 DIAGNOSIS — R2689 Other abnormalities of gait and mobility: Secondary | ICD-10-CM

## 2021-02-20 DIAGNOSIS — R41841 Cognitive communication deficit: Secondary | ICD-10-CM

## 2021-02-20 DIAGNOSIS — R482 Apraxia: Secondary | ICD-10-CM

## 2021-02-20 DIAGNOSIS — R41842 Visuospatial deficit: Secondary | ICD-10-CM

## 2021-02-20 DIAGNOSIS — I69351 Hemiplegia and hemiparesis following cerebral infarction affecting right dominant side: Secondary | ICD-10-CM

## 2021-02-20 DIAGNOSIS — R41844 Frontal lobe and executive function deficit: Secondary | ICD-10-CM

## 2021-02-20 DIAGNOSIS — R278 Other lack of coordination: Secondary | ICD-10-CM

## 2021-02-20 DIAGNOSIS — R4701 Aphasia: Secondary | ICD-10-CM

## 2021-02-20 DIAGNOSIS — R41840 Attention and concentration deficit: Secondary | ICD-10-CM

## 2021-02-20 DIAGNOSIS — M542 Cervicalgia: Secondary | ICD-10-CM

## 2021-02-20 NOTE — Telephone Encounter (Signed)
Amber [name] has located your FMLA for me.  Will put copy at reception for you to pick up. Let me know if you have any questions.

## 2021-02-20 NOTE — Therapy (Signed)
[facility] [address] [place], [place], [address] Phone: [phone]   Fax:  [phone]  Physical Therapy Treatment  Patient Details  Name: Amber [name] MRN: [id number] Date of Birth: Nov 11, 1970 Referring Provider (PT): Cruzita Lederer, PA-C   Encounter Date: 02/20/2021   PT End of Session - 02/20/21 1020     Visit Number 6    Number of Visits 17    Date for PT Re-Evaluation 03/25/21    Authorization Type BCBS primary; UHC MCD secondary (no auth required)    PT Start Time 1015    PT Stop Time 1100    PT Time Calculation (min) 45 min    Equipment Utilized During Treatment Gait belt    Activity Tolerance Patient tolerated treatment well    Behavior During Therapy [facility] for tasks assessed/performed             Past Medical History:  Diagnosis Date   Anxiety    self reported   Asthma    Depression    controlled   Diabetes mellitus without complication (HCC)    Hyperlipidemia    controlled with medication   IBS (irritable bowel syndrome)    Slurred speech    Stroke (HCC) 03/13/2015   Tension headache 11/18/2020   Word finding difficulty 02/27/2018    Past Surgical History:  Procedure Laterality Date   ABDOMINAL HYSTERECTOMY  10/2006   bowel reconstruction  10/2006   with hysterectomy   CESAREAN SECTION  2005   EP IMPLANTABLE DEVICE N/A 03/17/2015   Procedure: Loop Recorder Insertion;  Surgeon: Will Jorja Loa, MD;  Location: [facility];  Service: Cardiovascular;  Laterality: N/A;   TEE WITHOUT CARDIOVERSION N/A 03/17/2015   Procedure: TRANSESOPHAGEAL ECHOCARDIOGRAM (TEE);  Surgeon: Chilton Si, MD;  Location: [facility] ENDOSCOPY;  Service: Cardiovascular;  Laterality: N/A;   TEE WITHOUT CARDIOVERSION N/A 01/06/2021   Procedure: TRANSESOPHAGEAL ECHOCARDIOGRAM (TEE);  Surgeon: [name], MD;  Location: [facility] ORS;  Service: Cardiovascular;  Laterality: N/A;   TOE SURGERY     Left 2nd  metatarsal    There were no vitals filed for this visit.   Subjective Assessment - 02/20/21 1020     Subjective MD said everything looked okay with Cardiac CT. I am doing okay. I have some congestion and had light headache. I am better now.    Pertinent History history of CVA in 2016 and 2022 , type 2 diabetes, hypertension, hyperlipidemia    Limitations Standing;Walking;House hold activities    Currently in Pain? No/denies                [facility] PT Assessment - 02/20/21 0001       Standardized Balance Assessment   10 Meter Walk 12 sec without AD   17 sec without AD and cognitive task            Standing heel raises and toe raises: on floor: 10x each Standing heel raises and toe raises on foam: 10x each Standing on 2 dyna discs: 15", 1' with horizontal head turns, 1' with vertical head turns 10 meter gait training: cues for arm swings during practice trials, performed 2 practice trials - Normal gait: 12 sec - driving bwd by 3 from 20: 17 sec - driving bwd by 3 from 30: 15 sec - driving bwd by 6 from 60: 17 sec and 17 sec Resisted walking: black sport cord: - fwd: 5 laps of 10 feet - lateral: 3 laps of 10 feet  each way  Sit to stand from 14" box: 5x, 5x with posterior pull of black sport cord                            PT Short Term Goals - 01/28/21 1200       PT SHORT TERM GOAL #1   Title Pt will be able to ambulate 400 feet with st. cane on grass to navigate community    Baseline NOt tried (eval)    Time 4    Period Weeks    Status New    Target Date 02/25/21      PT SHORT TERM GOAL #2   Title Pt will demo 5x sit to stand without HHA in under <25 seconds to improve overall functional strength    Baseline 33 sec (eval)    Time 4    Period Weeks    Status New    Target Date 02/25/21      PT SHORT TERM GOAL #3   Title Patient will be able to go up and down stairs with reciprocal steps with use of 1 hand rail to improve navigating  steps at home    Baseline step to pattern with rail (eval(    Time 4    Period Weeks    Status New    Target Date 02/25/21               PT Long Term Goals - 02/04/21 1454       PT LONG TERM GOAL #1   Title Pt will dem 5x sit to stand <20 seconds without HHA to improve functional strength with trasnfers    Baseline 33 sec (eval)    Time 8    Period Weeks    Status New      PT LONG TERM GOAL #2   Title Pt will be able to ambulate >1 m/s with LRAD to improve gait speed to improve community ambulation    Baseline 0.47m/s with st. cane (eval)    Time 8    Period Weeks    Status New      PT LONG TERM GOAL #3   Title Pt will be able to ambulate on grass for 200 feet without AD and SBA to improve community negotiation    Baseline not tried (Eval)    Time 8    Period Weeks    Status New      PT LONG TERM GOAL #4   Title Pt will improve FGA score to >/=19/30 in order to indicate dec fall risk.    Time 8    Period Weeks    Status New                   Plan - 02/20/21 1021     Clinical Impression Statement Patient's gait speed was 5 sec slower with cognitive task compared to normal gait speed. Pt tends to look up when cognitive task is involved which affects her balance with gait. Tolerated porgression of dynamic activities well.    Personal Factors and Comorbidities Comorbidity 3+;Time since onset of injury/illness/exacerbation;Transportation    Comorbidities Privous hx of stroke, DM, asthma    Examination-Activity Limitations Caring for Others;Carry;Lift;Squat;Stairs;Stand;Transfers;Bend    Examination-Participation Restrictions Church;Cleaning;Community Activity;Driving;Laundry;Medication Management;Meal Prep;Occupation;Shop;Yard Work    Stability/Clinical Decision Making Stable/Uncomplicated    Rehab Potential Good    PT Frequency 2x / week    PT Duration 8 weeks  PT Treatment/Interventions ADLs/Self Care Home Management;Aquatic  Therapy;Cryotherapy;Electrical Stimulation;Moist Heat;Traction;Gait training;Stair training;Functional mobility training;Therapeutic activities;Therapeutic exercise;Balance training;Neuromuscular re-education;Cognitive remediation;Patient/family education;Orthotic Fit/Training;Manual techniques;Passive range of motion;Energy conservation;Visual/perceptual remediation/compensation;Joint Manipulations;Spinal Manipulations    PT Next Visit Plan Goals due 9/28!! Continue to add congnitive tasks with functional mobility and balance and gait    Consulted and Agree with Plan of Care Patient             Patient will benefit from skilled therapeutic intervention in order to improve the following deficits and impairments:  Abnormal gait, Decreased activity tolerance, Decreased balance, Decreased coordination, Decreased endurance, Decreased mobility, Decreased safety awareness, Decreased strength, Difficulty walking, Impaired UE functional use, Impaired vision/preception, Postural dysfunction, Improper body mechanics, Pain  Visit Diagnosis: Muscle weakness (generalized)  Other abnormalities of gait and mobility  Neck pain     Problem List Patient Active Problem List   Diagnosis Date Noted   Muscle cramps    Diabetes mellitus type II, controlled (HCC)    Cramp in limb    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   Controlled type 2 diabetes mellitus with complication, without long-term current use of insulin (HCC)    Vascular headache    Dyslipidemia    Cryptogenic stroke (HCC)    Expressive aphasia    Acute CVA (cerebrovascular accident) (HCC) 01/02/2021   Dizziness 12/18/2020   Tension headache 11/18/2020   Acute neck pain 11/18/2020   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   Splinter in skin, left foot 12/11/2019   Allergic rhinitis due to pollen 10/15/2019   Concentration deficit 08/14/2019   Non-cardiac chest pain 05/18/2019   Anxiety 04/13/2019   Weight 03/06/2019   Right flank pain  11/22/2018   Left shoulder pain 08/02/2018   Restless leg syndrome 12/29/2016   GERD (gastroesophageal reflux disease) 08/24/2016   Hemorrhoids 08/24/2016   Healthcare maintenance 08/24/2016   GAD (generalized anxiety disorder) 06/22/2016   Levoscoliosis 10/06/2015   Type 2 diabetes mellitus (HCC) 09/08/2015   Fatigue 04/03/2015   History of stroke 03/14/2015   IBS (irritable bowel syndrome) 09/29/2014   Hyperlipidemia 09/29/2014   Essential hypertension 09/29/2014    Ileana Ladd, PT 02/20/2021, 11:05 AM  [facility] [address] [place], [place], [address] Phone: [phone]   Fax:  [phone]  Name: Jalysa Swopes MRN: [id number] Date of Birth: 18-Sep-1970

## 2021-02-20 NOTE — Telephone Encounter (Signed)
Have called patient working with Shanda Bumps to get her FMLA ppw put at reception for her to pick up.

## 2021-02-20 NOTE — Therapy (Signed)
[facility] [address] [place], [place], [address] Phone: [phone]   Fax:  [phone]  Speech Language Pathology Treatment  Patient Details  Name: Amber [name] MRN: [id number] Date of Birth: 11/17/1970 Referring Provider (SLP): Mariam Dollar PA   Encounter Date: 02/20/2021   End of Session - 02/20/21 2142     Visit Number 6    Number of Visits 25    Date for SLP Re-Evaluation 04/22/21    Authorization Type BCBS medicaid - 30 VL all disciplines. . According to notes, private insurance will kick in after medicaid is exhausted, double check this when she has reached 10 ST visits or 30 mulit D visits.    Authorization - Visit Number 6    Authorization - Number of Visits 10    SLP Start Time 1104    SLP Stop Time  1145    SLP Time Calculation (min) 41 min    Activity Tolerance Patient tolerated treatment well             Past Medical History:  Diagnosis Date   Anxiety    self reported   Asthma    Depression    controlled   Diabetes mellitus without complication (HCC)    Hyperlipidemia    controlled with medication   IBS (irritable bowel syndrome)    Slurred speech    Stroke (HCC) 03/13/2015   Tension headache 11/18/2020   Word finding difficulty 02/27/2018    Past Surgical History:  Procedure Laterality Date   ABDOMINAL HYSTERECTOMY  10/2006   bowel reconstruction  10/2006   with hysterectomy   CESAREAN SECTION  2005   EP IMPLANTABLE DEVICE N/A 03/17/2015   Procedure: Loop Recorder Insertion;  Surgeon: Will Jorja Loa, MD;  [facility];  Service: Cardiovascular;  Laterality: N/A;   TEE WITHOUT CARDIOVERSION N/A 03/17/2015   Procedure: TRANSESOPHAGEAL ECHOCARDIOGRAM (TEE);  Surgeon: Chilton Si, MD;  Location: [facility] ENDOSCOPY;  Service: Cardiovascular;  Laterality: N/A;   TEE WITHOUT CARDIOVERSION N/A 01/06/2021   Procedure: TRANSESOPHAGEAL ECHOCARDIOGRAM (TEE);  Surgeon:  [name], MD;  Location: [facility] ORS;  Service: Cardiovascular;  Laterality: N/A;   TOE SURGERY     Left 2nd metatarsal    There were no vitals filed for this visit.   Subjective Assessment - 02/20/21 1110     Subjective "I forgot my folder today."  "I've been practishing the words and putting - them into - sentences."    Currently in Pain? No/denies                   ADULT SLP TREATMENT - 02/20/21 1111       General Information   Behavior/Cognition Alert;Cooperative;Pleasant mood      Treatment Provided   Treatment provided Cognitive-Linquistic      Cognitive-Linquistic Treatment   Treatment focused on Apraxia;Aphasia;Patient/family/caregiver education    Skilled Treatment Pt states she is telling herself to slow down for speech rate. Pt expressed that she used to be concerned people would think she was "special" and that there is less pressure now about that. Pt used reduced speech rate and had some halting speech with rare phonemic paraphasias during this explanation of 2-3 minutes. SLP reiterated pt's slow rate and suggested pt also feel the sounds begin made in her oral cavity. Pt held the majority of conversational burden today and was functional. More difficulty was noted on 3+ syllable words with "close" articulation (e.g., "probably", "underpriveleged").  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 with occasional min A over 2 sessions    Baseline No HEP    Status Achieved      SLP SHORT TERM GOAL #2   Title Pt will name 15 items in persoanlly relevant category with rare min A    Baseline 12 items;   02/11/21, 02/18/21    Status Achieved      SLP SHORT TERM GOAL #3   Title Pt will use pill  organizer to independenlty manage medications over 1 weeks with mod I    Baseline daughter and mom A pt with meds, no pill organizer    Status Achieved      SLP SHORT TERM GOAL #4   Title Pt will demonstrate 4 or less dysfluencies in 8 minute conversatoin with rare min A    Baseline 8 dysfluencies    Time 5    Period Weeks    Status On-going              SLP Long Term Goals - 02/20/21 2144       SLP LONG TERM GOAL #1   Title Pt will carryover verbal compensatory strategies for word finding episodes as needed in 15 minute conversation with rare min A    Baseline no compensatory strategies; 02-18-21    Time 10    Period Weeks    Status On-going      SLP LONG TERM GOAL #2   Title Pt will demonstrate fluent speech over 15 minute converation with rare min A over 2 sessions    Baseline Pt dysfluent at sentence level    Time 10    Period Weeks    Status On-going      SLP LONG TERM GOAL #3   Title Pt will utilize calendar and external aids to manage appointments, schedule, to do list with mod I over 2 sessions    Baseline daughter and mom managing her schedule/appointments    Time 10    Period Weeks    Status On-going      SLP LONG TERM GOAL #4   Title Pt will double check and correct texts prior to sending then with supervision cues    Baseline Pt sending error and non sensical texts without awareness    Time 10    Period Weeks    Status On-going              Plan - 02/20/21 2143     Clinical Impression Statement [name] demonstrates ability to speak with increased rate of speech and she is self correcting errors and adjusting rate slower as needed. Pt effectively use synonym strategy x1 in conversation when anomia occured. SLP discussed techniques to aid attention and external aids to manage time and reduce distractions. Continue skilled ST to maximize intelligibility, communication and cognition for safety, independence, QOL and possible return to work in some capactiy     Speech Therapy Frequency 2x / week    Duration 12 weeks    Treatment/Interventions Language facilitation;Environmental controls;Cueing hierarchy;SLP instruction and feedback;Compensatory strategies;Functional tasks;Cognitive reorganization;Compensatory techniques;Patient/family education;Multimodal communcation approach;Internal/external aids  Potential to Achieve Goals Good    Consulted and Agree with Plan of Care Patient             Patient will benefit from skilled therapeutic intervention in order to improve the following deficits and impairments:   Verbal apraxia  Aphasia  Cognitive communication deficit    Problem List Patient Active Problem List   Diagnosis Date Noted   Muscle cramps    Diabetes mellitus type II, controlled (HCC)    Cramp in limb    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   Controlled type 2 diabetes mellitus with complication, without long-term current use of insulin (HCC)    Vascular headache    Dyslipidemia    Cryptogenic stroke (HCC)    Expressive aphasia    Acute CVA (cerebrovascular accident) (HCC) 01/02/2021   Dizziness 12/18/2020   Tension headache 11/18/2020   Acute neck pain 11/18/2020   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   Splinter in skin, left foot 12/11/2019   Allergic rhinitis due to pollen 10/15/2019   Concentration deficit 08/14/2019   Non-cardiac chest pain 05/18/2019   Anxiety 04/13/2019   Weight 03/06/2019   Right flank pain 11/22/2018   Left shoulder pain 08/02/2018   Restless leg syndrome 12/29/2016   GERD (gastroesophageal reflux disease) 08/24/2016   Hemorrhoids 08/24/2016   Healthcare maintenance 08/24/2016   GAD (generalized anxiety disorder) 06/22/2016   Levoscoliosis 10/06/2015   Type 2 diabetes mellitus (HCC) 09/08/2015   Fatigue 04/03/2015   History of stroke 03/14/2015   IBS (irritable bowel syndrome) 09/29/2014   Hyperlipidemia 09/29/2014   Essential hypertension 09/29/2014    Tarus Briski ,MS,  CCC-SLP  02/20/2021, 9:44 PM  [place] [facility] [address] [place], [place], [address] Phone: [phone]   Fax:  [phone]   Name: Florabelle Cardin MRN: [id number] Date of Birth: 25-Jun-1970
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
## 2021-02-20 NOTE — Patient Instructions (Signed)
Access Code: [email or web address] URL: [email or web address] Date: 02/20/2021 Prepared by: Kallie Edward  Exercises Seated Shoulder Horizontal Abduction with Resistance - 1 x daily - 7 x weekly - 3 sets - 10 reps Seated Shoulder Row with Resistance Anchored at Feet - 1 x daily - 7 x weekly - 3 sets - 10 reps Seated Shoulder Front Raise with Resistance - 1 x daily - 7 x weekly - 3 sets - 10 reps Seated Shoulder Extension with Resistance - 1 x daily - 7 x weekly - 3 sets - 10 reps Standing Single Arm Elbow Flexion with Resistance - 1 x daily - 7 x weekly - 3 sets - 10 reps Alternating Punch with Resistance - 1 x daily - 7 x weekly - 3 sets - 10 reps

## 2021-02-21 ENCOUNTER — Other Ambulatory Visit: Payer: Self-pay

## 2021-02-21 MED ORDER — CLOPIDOGREL BISULFATE 75 MG PO TABS: 75.0000 mg | ORAL_TABLET | Freq: Every day | ORAL | 0 refills | Status: DC

## 2021-02-21 NOTE — Telephone Encounter (Signed)
I have verified with Jae Dire ok to call in 90 day refill. Sent in to local pharmacy. Called patient she will pick up today and start back on as soon as possible.

## 2021-02-23 ENCOUNTER — Ambulatory Visit: Payer: BC Managed Care – PPO | Admitting: Occupational Therapy

## 2021-02-23 ENCOUNTER — Ambulatory Visit: Payer: BC Managed Care – PPO | Admitting: Physical Therapy

## 2021-02-23 ENCOUNTER — Encounter: Payer: Self-pay | Admitting: Physical Therapy

## 2021-02-23 ENCOUNTER — Ambulatory Visit: Payer: BC Managed Care – PPO

## 2021-02-23 ENCOUNTER — Encounter: Payer: Self-pay | Admitting: Occupational Therapy

## 2021-02-23 ENCOUNTER — Other Ambulatory Visit: Payer: Self-pay

## 2021-02-23 DIAGNOSIS — M6281 Muscle weakness (generalized): Secondary | ICD-10-CM

## 2021-02-23 DIAGNOSIS — R41840 Attention and concentration deficit: Secondary | ICD-10-CM

## 2021-02-23 DIAGNOSIS — I69351 Hemiplegia and hemiparesis following cerebral infarction affecting right dominant side: Secondary | ICD-10-CM

## 2021-02-23 DIAGNOSIS — R41842 Visuospatial deficit: Secondary | ICD-10-CM

## 2021-02-23 DIAGNOSIS — R482 Apraxia: Secondary | ICD-10-CM

## 2021-02-23 DIAGNOSIS — R2689 Other abnormalities of gait and mobility: Secondary | ICD-10-CM

## 2021-02-23 DIAGNOSIS — R41844 Frontal lobe and executive function deficit: Secondary | ICD-10-CM

## 2021-02-23 DIAGNOSIS — R278 Other lack of coordination: Secondary | ICD-10-CM

## 2021-02-23 NOTE — Patient Instructions (Signed)
   Set your phone for 3 30-minute time periods during the day when you will focus on slowed speech.

## 2021-02-23 NOTE — Therapy (Signed)
[facility] [address] [place], [place], [address] Phone: [phone]   Fax:  [phone]  Speech Language Pathology Treatment  Patient Details  Name: Amber [name] MRN: [id number] Date of Birth: July 26, 1970 Referring Provider (SLP): Mariam Dollar PA   Encounter Date: 02/23/2021   End of Session - 02/23/21 1143     Visit Number 7    Number of Visits 25    Date for SLP Re-Evaluation 04/22/21    Authorization Type BCBS medicaid - 30 VL all disciplines. . According to notes, private insurance will kick in after medicaid is exhausted, double check this when she has reached 10 ST visits or 30 mulit D visits.    Authorization - Visit Number 7    Authorization - Number of Visits 10    SLP Start Time 1102    SLP Stop Time  1145    SLP Time Calculation (min) 43 min    Activity Tolerance Patient tolerated treatment well             Past Medical History:  Diagnosis Date   Anxiety    self reported   Asthma    Depression    controlled   Diabetes mellitus without complication (HCC)    Hyperlipidemia    controlled with medication   IBS (irritable bowel syndrome)    Slurred speech    Stroke (HCC) 03/13/2015   Tension headache 11/18/2020   Word finding difficulty 02/27/2018    Past Surgical History:  Procedure Laterality Date   ABDOMINAL HYSTERECTOMY  10/2006   bowel reconstruction  10/2006   with hysterectomy   CESAREAN SECTION  2005   EP IMPLANTABLE DEVICE N/A 03/17/2015   Procedure: Loop Recorder Insertion;  Surgeon: Will Jorja Loa, MD;  [facility]: [facility];  Service: Cardiovascular;  Laterality: N/A;   TEE WITHOUT CARDIOVERSION N/A 03/17/2015   Procedure: TRANSESOPHAGEAL ECHOCARDIOGRAM (TEE);  Surgeon: Chilton Si, MD;  Location: [facility];  Service: Cardiovascular;  Laterality: N/A;   TEE WITHOUT CARDIOVERSION N/A 01/06/2021   Procedure: TRANSESOPHAGEAL ECHOCARDIOGRAM (TEE);  Surgeon:  [name], MD;  Location: [facility] ORS;  Service: Cardiovascular;  Laterality: N/A;   TOE SURGERY     Left 2nd metatarsal    There were no vitals filed for this visit.   Subjective Assessment - 02/23/21 1108     Subjective "Can I say I - was - lazy over the -- weekend?"    Currently in Pain? No/denies                   ADULT SLP TREATMENT - 02/23/21 1111       General Information   Behavior/Cognition Alert;Cooperative;Pleasant mood      Treatment Provided   Treatment provided Cognitive-Linquistic      Cognitive-Linquistic Treatment   Treatment focused on Aphasia;Apraxia;Patient/family/caregiver education    Skilled Treatment SLP targeted pt's verbal apraxia today by having her use compensatory stategies for her verbal communication in 8-10 minute conversational segments. Pt had most difficulty with 3- and more-syllable words (e.g., "probably", "occupational", etc) however using slower rate, pt was 95%+ functionally fluent in her speech. Pt was more successful in second and third conversational segments - pt stated due to her putting more thought into slowing her rate of speech. SLP told pt to set alarms for 3-30 minute time periods during the day when she will focus on slowed rate of speech.      Assessment / Recommendations / Plan   Plan  Continue with current plan of care      Progression Toward Goals   Progression toward goals Progressing toward goals                SLP Short Term Goals - 02/23/21 1124       SLP SHORT TERM GOAL #1   Title Pt will complete HEP for verbal apraxia with occasional min A over 2 sessions    Baseline No HEP    Status Achieved      SLP SHORT TERM GOAL #2   Title Pt will name 15 items in persoanlly relevant category with rare min A    Baseline 12 items;   02/11/21, 02/18/21    Status Achieved      SLP SHORT TERM GOAL #3   Title Pt will use pill organizer to independenlty manage medications over 1 weeks with mod I    Baseline  daughter and mom A pt with meds, no pill organizer    Status Achieved      SLP SHORT TERM GOAL #4   Title Pt will demonstrate 4 or less dysfluencies in 8 minute conversatoin with rare min A    Baseline 8 dysfluencies    Time 3    Period Weeks    Status On-going              SLP Long Term Goals - 02/23/21 1152       SLP LONG TERM GOAL #1   Title Pt will carryover verbal compensatory strategies for word finding episodes as needed in 15 minute conversation with rare min A    Baseline no compensatory strategies; 02-18-21    Time 9    Period Weeks    Status On-going      SLP LONG TERM GOAL #2   Title Pt will demonstrate fluent speech over 15 minute converation with rare min A over 2 sessions    Baseline Pt dysfluent at sentence level    Time 9    Period Weeks    Status On-going      SLP LONG TERM GOAL #3   Title Pt will utilize calendar and external aids to manage appointments, schedule, to do list with mod I over 2 sessions    Baseline daughter and mom managing her schedule/appointments    Time 9    Period Weeks    Status On-going      SLP LONG TERM GOAL #4   Title Pt will double check and correct texts prior to sending then with supervision cues    Baseline Pt sending error and non sensical texts without awareness    Time 9    Period Weeks    Status On-going              Plan - 02/23/21 1125     Clinical Impression Statement [name] demonstrates ability to speak with increased rate of speech and she is self correcting errors and continues to adjust to slower rate of speech. Today in mod compelx conversation for 8 minutes pt without anomia. SLP discussed techniques to aid auditory comprehension in more complex conversations. 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 See today's note for more details. Continue skilled ST to maximize intelligibility, communication and cognition for  safety, independence, QOL and possible return to work in some capactiy    Speech Therapy Frequency 2x / week    Duration 12 weeks  Treatment/Interventions Language facilitation;Environmental controls;Cueing hierarchy;SLP instruction and feedback;Compensatory strategies;Functional tasks;Cognitive reorganization;Compensatory techniques;Patient/family education;Multimodal communcation approach;Internal/external aids    Potential to Achieve Goals Good    Consulted and Agree with Plan of Care Patient             Patient will benefit from skilled therapeutic intervention in order to improve the following deficits and impairments:   Verbal apraxia    Problem List Patient Active Problem List   Diagnosis Date Noted   Muscle cramps    Diabetes mellitus type II, controlled (HCC)    Cramp in limb    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   Controlled type 2 diabetes mellitus with complication, without long-term current use of insulin (HCC)    Vascular headache    Dyslipidemia    Cryptogenic stroke (HCC)    Expressive aphasia    Acute CVA (cerebrovascular accident) (HCC) 01/02/2021   Dizziness 12/18/2020   Tension headache 11/18/2020   Acute neck pain 11/18/2020   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   Splinter in skin, left foot 12/11/2019   Allergic rhinitis due to pollen 10/15/2019   Concentration deficit 08/14/2019   Non-cardiac chest pain 05/18/2019   Anxiety 04/13/2019   Weight 03/06/2019   Right flank pain 11/22/2018   Left shoulder pain 08/02/2018   Restless leg syndrome 12/29/2016   GERD (gastroesophageal reflux disease) 08/24/2016   Hemorrhoids 08/24/2016   Healthcare maintenance 08/24/2016   GAD (generalized anxiety disorder) 06/22/2016   Levoscoliosis 10/06/2015   Type 2 diabetes mellitus (HCC) 09/08/2015   Fatigue 04/03/2015   History of stroke 03/14/2015   IBS (irritable bowel syndrome) 09/29/2014   Hyperlipidemia 09/29/2014   Essential hypertension 09/29/2014     Fay Swider  ,MS, CCC-SLP  02/23/2021, 11:54 AM  [facility] [address] [place], [place], [address] Phone: [phone]   Fax:  [phone]   Name: Amenah Tucci MRN: [id number] Date of Birth: 09/10/1970

## 2021-02-23 NOTE — Therapy (Signed)
[facility] [address] [place], [place], [address] Phone: [phone]   Fax:  [phone]  Physical Therapy Treatment  Patient Details  Name: Amber [name] MRN: [id number] Date of Birth: [birth year] Referring Provider (PT): Jethro Bolus, PA-C   Encounter Date: 02/23/2021   PT End of Session - 02/23/21 1126     Visit Number 7    Number of Visits 17    Date for PT Re-Evaluation 03/25/21    Authorization Type BCBS primary; [employer] MCD secondary (no auth required)    PT Start Time 1026   pt arrived late   PT Stop Time 1100    PT Time Calculation (min) 34 min    Activity Tolerance Patient tolerated treatment well             Past Medical History:  Diagnosis Date   Anxiety    self reported   Asthma    Depression    controlled   Diabetes mellitus without complication ([place])    Hyperlipidemia    controlled with medication   IBS (irritable bowel syndrome)    Slurred speech    Stroke ([place]) 03/13/2015   Tension headache 11/18/2020   Word finding difficulty 02/27/2018    Past Surgical History:  Procedure Laterality Date   ABDOMINAL HYSTERECTOMY  10/2006   bowel reconstruction  10/2006   with hysterectomy   CESAREAN SECTION  2005   EP IMPLANTABLE DEVICE N/A 03/17/2015   Procedure: Loop Recorder Insertion;  Surgeon: Will Meredith Leeds, MD;  Location: [facility];  Service: Cardiovascular;  Laterality: N/A;   TEE WITHOUT CARDIOVERSION N/A 03/17/2015   Procedure: TRANSESOPHAGEAL ECHOCARDIOGRAM (TEE);  Surgeon: Skeet Latch, MD;  Location: [place];  Service: Cardiovascular;  Laterality: N/A;   TEE WITHOUT CARDIOVERSION N/A 01/06/2021   Procedure: TRANSESOPHAGEAL ECHOCARDIOGRAM (TEE);  Surgeon: Nelva Bush, MD;  Location: [facility] ORS;  Service: Cardiovascular;  Laterality: N/A;   TOE SURGERY     Left 2nd metatarsal    There were no vitals filed for this visit.   Subjective Assessment -  02/23/21 1029     Subjective Pt reports going to the grocery store with mom and was able shop for 20 min before needing to stop sit and was done with shopping due to fatiue.   Currently in Pain? No/denies                               [facility] Adult PT Treatment/Exercise - 02/23/21 0001       Transfers   Five time sit to stand comments  17.47 sec without UE      Ambulation/Gait   Ambulation/Gait Yes    Ambulation/Gait Assistance 5: Supervision    Ambulation/Gait Assistance Details community outdoor surfaces working on balance and cues for leading with cane with surfaces changes, cues for Left UE swing with gait.    Ambulation Distance (Feet) 500 Feet    Assistive device Straight cane    Gait Pattern Step-through pattern;Decreased arm swing - left;Decreased stride length;Trunk flexed    Ambulation Surface Outdoor;Paved;Gravel;Grass    Stairs Yes    Stairs Assistance 5: Supervision    Stairs Assistance Details (indicate cue type and reason) cues for reciprocal gait technique and decreasing UE support on rail    Stair Management Technique One rail Right;Alternating pattern;Forwards   multple reps   Number of Stairs 4   x4   Height of Stairs 6  PT Education - 02/23/21 1124     Education Details Discussed goals checked;  discussed personal goal planning for functional tasks, how to take needed rest breaks, how to break up a task, how to track progress with timing activity/activity tolerance.    Person(s) Educated Patient    Methods Explanation    Comprehension Verbalized understanding              PT Short Term Goals - 02/23/21 1048       PT SHORT TERM GOAL #1   Title Pt will be able to ambulate 400 feet with st. cane on grass to navigate community    Baseline Met, 02/23/21    Time 4    Period Weeks    Status Achieved    Target Date 02/25/21      PT SHORT TERM GOAL #2   Title Pt will demo 5x sit to stand without HHA in  under <25 seconds to improve overall functional strength    Baseline 17.47sec, 02/23/21    Time 4    Period Weeks    Status Achieved    Target Date 02/25/21      PT SHORT TERM GOAL #3   Title Patient will be able to go up and down stairs with reciprocal steps with use of 1 hand rail to improve navigating steps at home    Baseline Met; 02/23/21    Time 4    Period Weeks    Status Achieved    Target Date 02/25/21               PT Long Term Goals - 02/04/21 1454       PT LONG TERM GOAL #1   Title Pt will dem 5x sit to stand <20 seconds without HHA to improve functional strength with trasnfers    Baseline 33 sec (eval)    Time 8    Period Weeks    Status New      PT LONG TERM GOAL #2   Title Pt will be able to ambulate >1 m/s with LRAD to improve gait speed to improve community ambulation    Baseline 0.36ms with st. cane (eval)    Time 8    Period Weeks    Status New      PT LONG TERM GOAL #3   Title Pt will be able to ambulate on grass for 200 feet without AD and SBA to improve community negotiation    Baseline not tried (Eval)    Time 8    Period Weeks    Status New      PT LONG TERM GOAL #4   Title Pt will improve FGA score to >/=19/30 in order to indicate dec fall risk.    Time 8    Period Weeks    Status New                   Plan - 02/23/21 1127     Clinical Impression Statement Pt is making good progress meeting all STGs.  Pt continues to experience limitations with left side weakness, fear of falling, slow movement for functional tasks and would benefit with continued POC.    Personal Factors and Comorbidities Comorbidity 3+;Time since onset of injury/illness/exacerbation;Transportation    Comorbidities Privous hx of stroke, DM, asthma    Examination-Activity Limitations Caring for Others;Carry;Lift;Squat;Stairs;Stand;Transfers;Bend    Examination-Participation Restrictions Church;Cleaning;Community Activity;Driving;Laundry;Medication  Management;Meal Prep;Occupation;Shop;Yard Work    SMerchant navy officerStable/Uncomplicated    Rehab Potential  Good    PT Frequency 2x / week    PT Duration 8 weeks    PT Treatment/Interventions ADLs/Self Care Home Management;Aquatic Therapy;Cryotherapy;Electrical Stimulation;Moist Heat;Traction;Gait training;Stair training;Functional mobility training;Therapeutic activities;Therapeutic exercise;Balance training;Neuromuscular re-education;Cognitive remediation;Patient/family education;Orthotic Fit/Training;Manual techniques;Passive range of motion;Energy conservation;Visual/perceptual remediation/compensation;Joint Manipulations;Spinal Manipulations    PT Next Visit Plan Continue to add congnitive tasks with functional mobility and balance and gait    Consulted and Agree with Plan of Care Patient             Patient will benefit from skilled therapeutic intervention in order to improve the following deficits and impairments:  Abnormal gait, Decreased activity tolerance, Decreased balance, Decreased coordination, Decreased endurance, Decreased mobility, Decreased safety awareness, Decreased strength, Difficulty walking, Impaired UE functional use, Impaired vision/preception, Postural dysfunction, Improper body mechanics, Pain  Visit Diagnosis: Muscle weakness (generalized)  Other abnormalities of gait and mobility     Problem List Patient Active Problem List   Diagnosis Date Noted   Muscle cramps    Diabetes mellitus type II, controlled ([place])    Cramp in limb    Stroke (cerebrum) ([place]) 01/07/2021   Controlled type 2 diabetes mellitus with complication, without long-term current use of insulin (HCC)    Vascular headache    Dyslipidemia    Cryptogenic stroke (HCC)    Expressive aphasia    Acute CVA (cerebrovascular accident) ([place]) 01/02/2021   Dizziness 12/18/2020   Tension headache 11/18/2020   Acute neck pain 11/18/2020   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    Splinter in skin, left foot 12/11/2019   Allergic rhinitis due to pollen 10/15/2019   Concentration deficit 08/14/2019   Non-cardiac chest pain 05/18/2019   Anxiety 04/13/2019   Weight 03/06/2019   Right flank pain 11/22/2018   Left shoulder pain 08/02/2018   Restless leg syndrome 12/29/2016   GERD (gastroesophageal reflux disease) 08/24/2016   Hemorrhoids 08/24/2016   Healthcare maintenance 08/24/2016   GAD (generalized anxiety disorder) 06/22/2016   Levoscoliosis 10/06/2015   Type 2 diabetes mellitus ([place]) 09/08/2015   Fatigue 04/03/2015   History of stroke 03/14/2015   IBS (irritable bowel syndrome) 09/29/2014   Hyperlipidemia 09/29/2014   Essential hypertension 09/29/2014    Bjorn Loser, PTA  02/23/21, 11:31 AM   [facility] [address] [place], [place], [address] Phone: [phone]   Fax:  [phone]  Name: Amber [name] MRN: [id number] Date of Birth: May 28, 1971
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
## 2021-02-25 ENCOUNTER — Ambulatory Visit: Payer: BC Managed Care – PPO | Admitting: Occupational Therapy

## 2021-02-25 ENCOUNTER — Ambulatory Visit: Payer: BC Managed Care – PPO

## 2021-02-25 ENCOUNTER — Encounter: Payer: Self-pay | Admitting: Occupational Therapy

## 2021-02-25 ENCOUNTER — Other Ambulatory Visit: Payer: Self-pay

## 2021-02-25 ENCOUNTER — Telehealth: Payer: Self-pay | Admitting: Primary Care

## 2021-02-25 VITALS — BP 108/78 | HR 65

## 2021-02-25 DIAGNOSIS — R41842 Visuospatial deficit: Secondary | ICD-10-CM

## 2021-02-25 DIAGNOSIS — M6281 Muscle weakness (generalized): Secondary | ICD-10-CM

## 2021-02-25 DIAGNOSIS — R41841 Cognitive communication deficit: Secondary | ICD-10-CM

## 2021-02-25 DIAGNOSIS — R41840 Attention and concentration deficit: Secondary | ICD-10-CM

## 2021-02-25 DIAGNOSIS — M542 Cervicalgia: Secondary | ICD-10-CM

## 2021-02-25 DIAGNOSIS — I69351 Hemiplegia and hemiparesis following cerebral infarction affecting right dominant side: Secondary | ICD-10-CM

## 2021-02-25 DIAGNOSIS — R482 Apraxia: Secondary | ICD-10-CM

## 2021-02-25 DIAGNOSIS — R41844 Frontal lobe and executive function deficit: Secondary | ICD-10-CM

## 2021-02-25 DIAGNOSIS — R2689 Other abnormalities of gait and mobility: Secondary | ICD-10-CM

## 2021-02-25 DIAGNOSIS — R4701 Aphasia: Secondary | ICD-10-CM

## 2021-02-25 DIAGNOSIS — R278 Other lack of coordination: Secondary | ICD-10-CM
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
## 2021-02-25 NOTE — Therapy (Signed)
[facility] [address] [place], [place], [address] Phone: [phone]   Fax:  [phone]  Physical Therapy Treatment  Patient Details  Name: Amber [name] MRN: [id number] Date of Birth: 1970-10-31 Referring Provider (PT): Jethro Bolus, PA-C   Encounter Date: 02/25/2021   PT End of Session - 02/25/21 1411     Visit Number 8    Number of Visits 17    Date for PT Re-Evaluation 03/25/21    Authorization Type BCBS primary; [employer] MCD secondary (no auth required)    PT Start Time 1400    PT Stop Time 1445    PT Time Calculation (min) 45 min    Activity Tolerance Patient tolerated treatment well             Past Medical History:  Diagnosis Date   Anxiety    self reported   Asthma    Depression    controlled   Diabetes mellitus without complication ([place])    Hyperlipidemia    controlled with medication   IBS (irritable bowel syndrome)    Slurred speech    Stroke ([place]) 03/13/2015   Tension headache 11/18/2020   Word finding difficulty 02/27/2018    Past Surgical History:  Procedure Laterality Date   ABDOMINAL HYSTERECTOMY  10/2006   bowel reconstruction  10/2006   with hysterectomy   CESAREAN SECTION  2005   EP IMPLANTABLE DEVICE N/A 03/17/2015   Procedure: Loop Recorder Insertion;  Surgeon: Will Meredith Leeds, MD;  Location: [facility];  Service: Cardiovascular;  Laterality: N/A;   TEE WITHOUT CARDIOVERSION N/A 03/17/2015   Procedure: TRANSESOPHAGEAL ECHOCARDIOGRAM (TEE);  Surgeon: Skeet Latch, MD;  Location: [facility];  Service: Cardiovascular;  Laterality: N/A;   TEE WITHOUT CARDIOVERSION N/A 01/06/2021   Procedure: TRANSESOPHAGEAL ECHOCARDIOGRAM (TEE);  Surgeon: Nelva Bush, MD;  Location: [facility];  Service: Cardiovascular;  Laterality: N/A;   TOE SURGERY     Left 2nd metatarsal    There were no vitals filed for this visit.   Subjective Assessment - 02/25/21 1412      Subjective I have a mild headache right now.    Currently in Pain? Yes    Pain Score 5     Pain Location Head    Pain Type Acute pain    Pain Onset Today    Pain Frequency Occasional                   Standing heel raises and toe raises on foam: 2 x 10x each Stair training:  - 4 steps with one rail with alternating steps SBA - 16 steps with alternating steps no rails, CGA Resisted walking: black sport cord: 1 x 460 feet with fast and normal walking on cues, CGA Sit to stand from 14" box with 20lb KB: 2 x 5 Curb step up and retro down: 1 x 5  R and L with holding 20lbs in R hand (pt unable to hold 20lbs in L UE)                          PT Short Term Goals - 02/23/21 1048       PT SHORT TERM GOAL #1   Title Pt will be able to ambulate 400 feet with st. cane on grass to navigate community    Baseline Met, 02/23/21    Time 4    Period Weeks    Status Achieved  Target Date 02/25/21      PT SHORT TERM GOAL #2   Title Pt will demo 5x sit to stand without HHA in under <25 seconds to improve overall functional strength    Baseline 17.47sec, 02/23/21    Time 4    Period Weeks    Status Achieved    Target Date 02/25/21      PT SHORT TERM GOAL #3   Title Patient will be able to go up and down stairs with reciprocal steps with use of 1 hand rail to improve navigating steps at home    Baseline Met; 02/23/21    Time 4    Period Weeks    Status Achieved    Target Date 02/25/21               PT Long Term Goals - 02/04/21 1454       PT LONG TERM GOAL #1   Title Pt will dem 5x sit to stand <20 seconds without HHA to improve functional strength with trasnfers    Baseline 33 sec (eval)    Time 8    Period Weeks    Status New      PT LONG TERM GOAL #2   Title Pt will be able to ambulate >1 m/s with LRAD to improve gait speed to improve community ambulation    Baseline 0.77ms with st. cane (eval)    Time 8    Period Weeks    Status New       PT LONG TERM GOAL #3   Title Pt will be able to ambulate on grass for 200 feet without AD and SBA to improve community negotiation    Baseline not tried (Eval)    Time 8    Period Weeks    Status New      PT LONG TERM GOAL #4   Title Pt will improve FGA score to >/=19/30 in order to indicate dec fall risk.    Time 8    Period Weeks    Status New                   Plan - 02/25/21 1447     Clinical Impression Statement Pt tolerated progression of exercises well. Pt demo lack of confidence with walking without AD. Pt had difficulty controling fast walking as she tended to run instead of fast walking.    Personal Factors and Comorbidities Comorbidity 3+;Time since onset of injury/illness/exacerbation;Transportation    Comorbidities Privous hx of stroke, DM, asthma    Examination-Activity Limitations Caring for Others;Carry;Lift;Squat;Stairs;Stand;Transfers;Bend    Examination-Participation Restrictions Church;Cleaning;Community Activity;Driving;Laundry;Medication Management;Meal Prep;Occupation;Shop;Yard Work    Stability/Clinical Decision Making Stable/Uncomplicated    Rehab Potential Good    PT Frequency 2x / week    PT Duration 8 weeks    PT Treatment/Interventions ADLs/Self Care Home Management;Aquatic Therapy;Cryotherapy;Electrical Stimulation;Moist Heat;Traction;Gait training;Stair training;Functional mobility training;Therapeutic activities;Therapeutic exercise;Balance training;Neuromuscular re-education;Cognitive remediation;Patient/family education;Orthotic Fit/Training;Manual techniques;Passive range of motion;Energy conservation;Visual/perceptual remediation/compensation;Joint Manipulations;Spinal Manipulations    PT Next Visit Plan Continue to add congnitive tasks with functional mobility and balance and gait    Consulted and Agree with Plan of Care Patient             Patient will benefit from skilled therapeutic intervention in order to improve the following  deficits and impairments:  Abnormal gait, Decreased activity tolerance, Decreased balance, Decreased coordination, Decreased endurance, Decreased mobility, Decreased safety awareness, Decreased strength, Difficulty walking, Impaired UE functional use, Impaired vision/preception, Postural dysfunction, Improper  body mechanics, Pain  Visit Diagnosis: Other abnormalities of gait and mobility  Frontal lobe and executive function deficit  Muscle weakness (generalized)  Neck pain     Problem List Patient Active Problem List   Diagnosis Date Noted   Muscle cramps    Diabetes mellitus type II, controlled ([place])    Cramp in limb    Stroke (cerebrum) ([place]) 01/07/2021   Controlled type 2 diabetes mellitus with complication, without long-term current use of insulin ([facility])    Vascular headache    Dyslipidemia    Cryptogenic stroke ([place])    Expressive aphasia    Acute CVA (cerebrovascular accident) ([place]) 01/02/2021   Dizziness 12/18/2020   Tension headache 11/18/2020   Acute neck pain 11/18/2020   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   Splinter in skin, left foot 12/11/2019   Allergic rhinitis due to pollen 10/15/2019   Concentration deficit 08/14/2019   Non-cardiac chest pain 05/18/2019   Anxiety 04/13/2019   Weight 03/06/2019   Right flank pain 11/22/2018   Left shoulder pain 08/02/2018   Restless leg syndrome 12/29/2016   GERD (gastroesophageal reflux disease) 08/24/2016   Hemorrhoids 08/24/2016   Healthcare maintenance 08/24/2016   GAD (generalized anxiety disorder) 06/22/2016   Levoscoliosis 10/06/2015   Type 2 diabetes mellitus ([place]) 09/08/2015   Fatigue 04/03/2015   History of stroke 03/14/2015   IBS (irritable bowel syndrome) 09/29/2014   Hyperlipidemia 09/29/2014   Essential hypertension 09/29/2014    Kerrie Pleasure, PT 02/25/2021, 2:48 PM  [facility] [address] [place], [place], [address] Phone:  [phone]   Fax:  [phone]  Name: Amber [name] MRN: [id number] Date of Birth: 12/11/1970

## 2021-02-25 NOTE — Therapy (Signed)
[facility] [address] [place], [place], [address] Phone: [phone]   Fax:  [phone]  Speech Language Pathology Treatment  Patient Details  Name: Amber [name] MRN: [id number] Date of Birth: 12-23-1970 Referring Provider (SLP): Mariam Dollar PA   Encounter Date: 02/25/2021   End of Session - 02/25/21 1402     Visit Number 8    Number of Visits 25    Date for SLP Re-Evaluation 04/22/21    Authorization Type BCBS medicaid - 30 VL all disciplines. . According to notes, private insurance will kick in after medicaid is exhausted, double check this when she has reached 10 ST visits or 30 mulit D visits.    Authorization - Visit Number 8    Authorization - Number of Visits 10    SLP Start Time 1316    SLP Stop Time  1400    SLP Time Calculation (min) 44 min    Activity Tolerance Patient tolerated treatment well             Past Medical History:  Diagnosis Date   Anxiety    self reported   Asthma    Depression    controlled   Diabetes mellitus without complication ([facility])    Hyperlipidemia    controlled with medication   IBS (irritable bowel syndrome)    Slurred speech    Stroke ([facility]) 03/13/2015   Tension headache 11/18/2020   Word finding difficulty 02/27/2018    Past Surgical History:  Procedure Laterality Date   ABDOMINAL HYSTERECTOMY  10/2006   bowel reconstruction  10/2006   with hysterectomy   CESAREAN SECTION  2005   EP IMPLANTABLE DEVICE N/A 03/17/2015   Procedure: Loop Recorder Insertion;  Surgeon: Will Jorja Loa, MD;  [facility];  Service: Cardiovascular;  Laterality: N/A;   TEE WITHOUT CARDIOVERSION N/A 03/17/2015   Procedure: TRANSESOPHAGEAL ECHOCARDIOGRAM (TEE);  Surgeon: Chilton Si, MD;  Location: [facility];  Service: Cardiovascular;  Laterality: N/A;   TEE WITHOUT CARDIOVERSION N/A 01/06/2021   Procedure: TRANSESOPHAGEAL ECHOCARDIOGRAM (TEE);  Surgeon:  [name], MD;  Location: [facility];  Service: Cardiovascular;  Laterality: N/A;   TOE SURGERY     Left 2nd metatarsal    There were no vitals filed for this visit.   Subjective Assessment - 02/25/21 1329     Subjective "I'm dealing with - a- little - headache today."    Currently in Pain? Yes    Pain Score 6     Pain Location Head    Pain Orientation Anterior    Pain Descriptors / Indicators Headache    Pain Type Acute pain                   ADULT SLP TREATMENT - 02/25/21 1330       General Information   Behavior/Cognition Alert;Cooperative;Pleasant mood      Treatment Provided   Treatment provided Cognitive-Linquistic      Cognitive-Linquistic Treatment   Treatment focused on Apraxia;Aphasia;Patient/family/caregiver education    Skilled Treatment SLP engaged pt with 15 minutes of conversation about [place] (daughter) "moment" at school. Pt had mild dysfluency during this conversation. Furhter mod complex conversation about her texting, which she reports re-reads all of her texts to double check she's using all the words she intends to use, as in the past pt reported she would leave out words. SLP checked this by having [name] read him her last 7 texts which were each WNL. Pt  cont'd with mild dysfluency/halting speech during this conversation. Last mod complex/complex conversation involved pt's medication administration - she is using a med box, and is routinely using this successfully for administration - further she stated daughter is providing supervision A "double check" and Amber [name] reported to SLP daughter has not made any corrections at least in the last three times (weeks). Pt's speech was mildly halting but less so than in previous conversation about texting.      Assessment / Recommendations / Plan   Plan Continue with current plan of care      Progression Toward Goals   Progression toward goals Progressing toward goals                SLP Short Term  Goals - 02/23/21 1124       SLP SHORT TERM GOAL #1   Title Pt will complete HEP for verbal apraxia with occasional min A over 2 sessions    Baseline No HEP    Status Achieved      SLP SHORT TERM GOAL #2   Title Pt will name 15 items in persoanlly relevant category with rare min A    Baseline 12 items;   02/11/21, 02/18/21    Status Achieved      SLP SHORT TERM GOAL #3   Title Pt will use pill organizer to independenlty manage medications over 1 weeks with mod I    Baseline daughter and mom A pt with meds, no pill organizer    Status Achieved      SLP SHORT TERM GOAL #4   Title Pt will demonstrate 4 or less dysfluencies in 8 minute conversatoin with rare min A    Baseline 8 dysfluencies    Time 3    Period Weeks    Status On-going              SLP Long Term Goals - 02/25/21 1406       SLP LONG TERM GOAL #1   Title Pt will carryover verbal compensatory strategies for word finding episodes as needed in 15 minute conversation with rare min A    Baseline no compensatory strategies; 02-18-21, 02-25-21    Time 9    Period Weeks    Status On-going      SLP LONG TERM GOAL #2   Title Pt will demonstrate fluent speech over 15 minute converation with rare min A over 2 sessions    Baseline Pt dysfluent at sentence level    Time 9    Period Weeks    Status On-going      SLP LONG TERM GOAL #3   Title Pt will utilize calendar and external aids to manage appointments, schedule, to do list with mod I over 2 sessions    Baseline daughter and mom managing her schedule/appointments    Time 9    Period Weeks    Status On-going      SLP LONG TERM GOAL #4   Title Pt will double check and correct texts prior to sending then with supervision cues    Baseline Pt sending error and non sensical texts without awareness;    Status Achieved              Plan - 02/25/21 1403     Clinical Impression Statement Amber [name] demonstrates ability to slow her rate of speech and improve  speech/language accuracy. Her self correcting of errors continues as improved as well. Today in mod compelx and mod-max complex conversation pt  speech was characterized as mildly halting/dysfluent. SLP heard pt describe der'd auditory comprehension in more complex conversations, and Amber [name] continues to use some strategies for incr'd auditory comprehension. Her texting appears WNL and she is able to administer her own meds routinely. She fills her med box with sup A. See today's note for more details. Continue skilled ST to maximize intelligibility, communication and cognition for safety, independence, QOL and possible return to work in some capactiy    Speech Therapy Frequency 2x / week    Duration 12 weeks    Treatment/Interventions Language facilitation;Environmental controls;Cueing hierarchy;SLP instruction and feedback;Compensatory strategies;Functional tasks;Cognitive reorganization;Compensatory techniques;Patient/family education;Multimodal communcation approach;Internal/external aids    Potential to Achieve Goals Good    Consulted and Agree with Plan of Care Patient             Patient will benefit from skilled therapeutic intervention in order to improve the following deficits and impairments:   Verbal apraxia  Aphasia  Cognitive communication deficit    Problem List Patient Active Problem List   Diagnosis Date Noted   Muscle cramps    Diabetes mellitus type II, controlled ([facility])    Cramp in limb    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   Controlled type 2 diabetes mellitus with complication, without long-term current use of insulin ([facility])    Vascular headache    Dyslipidemia    Cryptogenic stroke ([facility])    Expressive aphasia    Acute CVA (cerebrovascular accident) ([facility]) 01/02/2021   Dizziness 12/18/2020   Tension headache 11/18/2020   Acute neck pain 11/18/2020   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   Splinter in skin, left foot 12/11/2019   Allergic rhinitis due to pollen  10/15/2019   Concentration deficit 08/14/2019   Non-cardiac chest pain 05/18/2019   Anxiety 04/13/2019   Weight 03/06/2019   Right flank pain 11/22/2018   Left shoulder pain 08/02/2018   Restless leg syndrome 12/29/2016   GERD (gastroesophageal reflux disease) 08/24/2016   Hemorrhoids 08/24/2016   Healthcare maintenance 08/24/2016   GAD (generalized anxiety disorder) 06/22/2016   Levoscoliosis 10/06/2015   Type 2 diabetes mellitus ([facility]) 09/08/2015   Fatigue 04/03/2015   History of stroke 03/14/2015   IBS (irritable bowel syndrome) 09/29/2014   Hyperlipidemia 09/29/2014   Essential hypertension 09/29/2014    Amber [name] ,MS, CCC-SLP  02/25/2021, 11:26 PM  [facility] [address] [place], [place], [address] Phone: [phone]   Fax:  [phone]   Name: Amber [name] MRN: [id number] Date of Birth: 31-Oct-1970

## 2021-02-25 NOTE — Telephone Encounter (Signed)
Pt called asking do she still need to come to her lab appoint on 10/3, since she was in the hospital 01/15/21 and they took lab then. Please advise.

## 2021-02-25 NOTE — Telephone Encounter (Signed)
Yes.  The lab appointment is to repeat her cholesterol.  During her hospital stay it was too high, but I had just increase the dose of her rosuvastatin to 40 mg.  Confirm that she is taking 40 mg of rosuvastatin. She can have lab drawn at [facility] station at that is better for her

## 2021-02-26 ENCOUNTER — Telehealth: Payer: Self-pay | Admitting: Primary Care

## 2021-02-26 NOTE — Telephone Encounter (Signed)
Pt wants to know if she needs blood work on 10/3 because she was in hospital and had a stroke and they took alot of blood out of her and doesn't know if she needs to come in. They checked her numbers for diabetes and cholesterol while the hospital. Pt need clarification. Please advise

## 2021-02-26 NOTE — Telephone Encounter (Signed)
Left message to return call to our office.  

## 2021-02-26 NOTE — Telephone Encounter (Signed)
Duplicate message see other phone note for documentation.

## 2021-02-27 ENCOUNTER — Ambulatory Visit: Payer: BC Managed Care – PPO | Admitting: Medical

## 2021-02-27 ENCOUNTER — Encounter: Payer: BC Managed Care – PPO | Admitting: Occupational Therapy

## 2021-02-27 ENCOUNTER — Ambulatory Visit: Payer: BC Managed Care – PPO

## 2021-02-27 NOTE — Telephone Encounter (Signed)
Pt returned call requesting a call back #[phone]

## 2021-02-27 NOTE — Telephone Encounter (Signed)
Called patient reviewed all information and repeated back to me. Will call if any questions.   She will keep fasting lab. I have made a virtual visit for tuesday she has had some bruise on left leg shin does not remember any injury.

## 2021-03-02 ENCOUNTER — Encounter: Payer: Self-pay | Admitting: Speech Pathology

## 2021-03-02 ENCOUNTER — Ambulatory Visit: Payer: BC Managed Care – PPO | Attending: Primary Care | Admitting: Physical Therapy

## 2021-03-02 ENCOUNTER — Ambulatory Visit: Payer: BC Managed Care – PPO | Admitting: Occupational Therapy

## 2021-03-02 ENCOUNTER — Other Ambulatory Visit: Payer: Self-pay

## 2021-03-02 ENCOUNTER — Other Ambulatory Visit: Payer: BC Managed Care – PPO

## 2021-03-02 ENCOUNTER — Encounter: Payer: Self-pay | Admitting: Occupational Therapy

## 2021-03-02 ENCOUNTER — Ambulatory Visit: Payer: BC Managed Care – PPO | Admitting: Speech Pathology

## 2021-03-02 DIAGNOSIS — M542 Cervicalgia: Secondary | ICD-10-CM | POA: Insufficient documentation

## 2021-03-02 DIAGNOSIS — R482 Apraxia: Secondary | ICD-10-CM

## 2021-03-02 DIAGNOSIS — R2689 Other abnormalities of gait and mobility: Secondary | ICD-10-CM

## 2021-03-02 DIAGNOSIS — M6281 Muscle weakness (generalized): Secondary | ICD-10-CM

## 2021-03-02 DIAGNOSIS — R41842 Visuospatial deficit: Secondary | ICD-10-CM

## 2021-03-02 DIAGNOSIS — I69351 Hemiplegia and hemiparesis following cerebral infarction affecting right dominant side: Secondary | ICD-10-CM | POA: Diagnosis present

## 2021-03-02 DIAGNOSIS — R4701 Aphasia: Secondary | ICD-10-CM | POA: Insufficient documentation

## 2021-03-02 DIAGNOSIS — R41840 Attention and concentration deficit: Secondary | ICD-10-CM | POA: Diagnosis present

## 2021-03-02 DIAGNOSIS — R278 Other lack of coordination: Secondary | ICD-10-CM | POA: Diagnosis present

## 2021-03-02 DIAGNOSIS — R41844 Frontal lobe and executive function deficit: Secondary | ICD-10-CM | POA: Insufficient documentation

## 2021-03-02 NOTE — Patient Instructions (Addendum)
Access Code: [email or web address] URL: [email or web address] Date: 03/02/2021 Prepared by: Waldon Merl  Pt performed these exercises with cues appropriate weight shifting, to decrease UE support, and for increasing step length. Exercises Sit to Stand Without Arm Support - 2 x daily - 7 x weekly - 1 sets - 10 reps Walking March - 1-2 x daily - 5 x weekly - 4 sets - 4 reps - 2 hold Backward Walking with Counter Support - 1-2 x daily - 5 x weekly - 4 sets - 4 reps Side Stepping with Counter Support - 1-2 x daily - 5 x weekly - 4 sets - 4 reps - 2 hold Tandem Walking with Counter Support - 1-2 x daily - 5 x weekly - 4 sets - 4 reps
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
## 2021-03-02 NOTE — Patient Instructions (Addendum)
   [facility]  [place] (Lynnell Catalan)  Recovery After Stroke  [facility]  [place]  Clinton Sawyer SLP [phone]

## 2021-03-02 NOTE — Therapy (Signed)
[facility] [address] [place], [place], [address] Phone: [phone]   Fax:  [phone]  Physical Therapy Treatment  Patient Details  Name: Amber [name] MRN: [id number] Date of Birth: 01-28-1971 Referring Provider (PT): Jethro Bolus, PA-C   Encounter Date: 03/02/2021   PT End of Session - 03/02/21 1325     Visit Number 9    Number of Visits 17    Date for PT Re-Evaluation 03/25/21    Authorization Type BCBS primary; [employer] MCD secondary (no auth required)    PT Start Time 1233    PT Stop Time 1315    PT Time Calculation (min) 42 min    Behavior During Therapy [facility] for tasks assessed/performed             Past Medical History:  Diagnosis Date   Anxiety    self reported   Asthma    Depression    controlled   Diabetes mellitus without complication ([place])    Hyperlipidemia    controlled with medication   IBS (irritable bowel syndrome)    Slurred speech    Stroke ([place]) 03/13/2015   Tension headache 11/18/2020   Word finding difficulty 02/27/2018    Past Surgical History:  Procedure Laterality Date   ABDOMINAL HYSTERECTOMY  10/2006   bowel reconstruction  10/2006   with hysterectomy   CESAREAN SECTION  2005   EP IMPLANTABLE DEVICE N/A 03/17/2015   Procedure: Loop Recorder Insertion;  Surgeon: Will Meredith Leeds, MD;  Location: [place] CV LAB;  Service: Cardiovascular;  Laterality: N/A;   TEE WITHOUT CARDIOVERSION N/A 03/17/2015   Procedure: TRANSESOPHAGEAL ECHOCARDIOGRAM (TEE);  Surgeon: Skeet Latch, MD;  Location: [place];  Service: Cardiovascular;  Laterality: N/A;   TEE WITHOUT CARDIOVERSION N/A 01/06/2021   Procedure: TRANSESOPHAGEAL ECHOCARDIOGRAM (TEE);  Surgeon: Nelva Bush, MD;  Location: [facility] ORS;  Service: Cardiovascular;  Laterality: N/A;   TOE SURGERY     Left 2nd metatarsal    There were no vitals filed for this visit.   Subjective Assessment - 03/02/21 1236      Subjective Pt traveled to [facility] Pt had SPC for community mobility, reports no balance issues.  Pt walks without SPC in her home.    Pertinent History history of CVA in 2016 and 2022 , type 2 diabetes, hypertension, hyperlipidemia    Limitations Standing;Walking;House hold activities               Pt performed these exercises with cues appropriate weight shifting, to decrease UE support, and for increasing step length. Exercises Sit to Stand Without Arm Support - 2 x daily - 7 x weekly - 1 sets - 10 reps Walking March - 1-2 x daily - 5 x weekly - 4 sets - 4 reps - 2 hold Backward Walking with Counter Support - 1-2 x daily - 5 x weekly - 4 sets - 4 reps Side Stepping with Counter Support - 1-2 x daily - 5 x weekly - 4 sets - 4 reps - 2 hold Tandem Walking with Counter Support - 1-2 x daily - 5 x weekly - 4 sets - 4 reps                         PT Education - 03/02/21 1322     Education Details Updated HEP with dynamic walking balance activties along counter.  Discussed with pt that w/c is not needed at this time for mobility due  to pt progressing with community gait with [facility] and has supervision as needed.    Person(s) Educated Patient    Methods Explanation    Comprehension Verbalized understanding;Returned demonstration;Verbal cues required;Need further instruction              PT Short Term Goals - 02/23/21 1048       PT SHORT TERM GOAL #1   Title Pt will be able to ambulate 400 feet with st. cane on grass to navigate community    Baseline Met, 02/23/21    Time 4    Period Weeks    Status Achieved    Target Date 02/25/21      PT SHORT TERM GOAL #2   Title Pt will demo 5x sit to stand without HHA in under <25 seconds to improve overall functional strength    Baseline 17.47sec, 02/23/21    Time 4    Period Weeks    Status Achieved    Target Date 02/25/21      PT SHORT TERM GOAL #3   Title Patient will be able to go up and down stairs with  reciprocal steps with use of 1 hand rail to improve navigating steps at home    Baseline Met; 02/23/21    Time 4    Period Weeks    Status Achieved    Target Date 02/25/21               PT Long Term Goals - 02/04/21 1454       PT LONG TERM GOAL #1   Title Pt will dem 5x sit to stand <20 seconds without HHA to improve functional strength with trasnfers    Baseline 33 sec (eval)    Time 8    Period Weeks    Status New      PT LONG TERM GOAL #2   Title Pt will be able to ambulate >1 m/s with LRAD to improve gait speed to improve community ambulation    Baseline 0.58ms with st. cane (eval)    Time 8    Period Weeks    Status New      PT LONG TERM GOAL #3   Title Pt will be able to ambulate on grass for 200 feet without AD and SBA to improve community negotiation    Baseline not tried (Eval)    Time 8    Period Weeks    Status New      PT LONG TERM GOAL #4   Title Pt will improve FGA score to >/=19/30 in order to indicate dec fall risk.    Time 8    Period Weeks    Status New                   Plan - 03/02/21 1326     Clinical Impression Statement Pt performed updates to HEP well using counter top for intermittent UE support for balance.  Pt continues to report and demonstrate with slow deliberate movements a "fear of falling" when ambulating without AD; but does demonstrate a greater speed and ease of movement when ambulating with SPC    Personal Factors and Comorbidities Comorbidity 3+;Time since onset of injury/illness/exacerbation;Transportation    Comorbidities Privous hx of stroke, DM, asthma    Examination-Activity Limitations Caring for Others;Carry;Lift;Squat;Stairs;Stand;Transfers;Bend    Examination-Participation Restrictions Church;Cleaning;Community Activity;Driving;Laundry;Medication Management;Meal Prep;Occupation;Shop;Yard Work    Stability/Clinical Decision Making Stable/Uncomplicated    Rehab Potential Good    PT Frequency 2x / week  PT  Duration 8 weeks    PT Treatment/Interventions ADLs/Self Care Home Management;Aquatic Therapy;Cryotherapy;Electrical Stimulation;Moist Heat;Traction;Gait training;Stair training;Functional mobility training;Therapeutic activities;Therapeutic exercise;Balance training;Neuromuscular re-education;Cognitive remediation;Patient/family education;Orthotic Fit/Training;Manual techniques;Passive range of motion;Energy conservation;Visual/perceptual remediation/compensation;Joint Manipulations;Spinal Manipulations    PT Next Visit Plan Continue to add congnitive tasks with functional mobility and balance and gait without AD.    Consulted and Agree with Plan of Care Patient             Patient will benefit from skilled therapeutic intervention in order to improve the following deficits and impairments:  Abnormal gait, Decreased activity tolerance, Decreased balance, Decreased coordination, Decreased endurance, Decreased mobility, Decreased safety awareness, Decreased strength, Difficulty walking, Impaired UE functional use, Impaired vision/preception, Postural dysfunction, Improper body mechanics, Pain  Visit Diagnosis: Muscle weakness (generalized)  Other abnormalities of gait and mobility     Problem List Patient Active Problem List   Diagnosis Date Noted   Muscle cramps    Diabetes mellitus type II, controlled ([place])    Cramp in limb    Stroke (cerebrum) ([place]) 01/07/2021   Controlled type 2 diabetes mellitus with complication, without long-term current use of insulin (HCC)    Vascular headache    Dyslipidemia    Cryptogenic stroke (HCC)    Expressive aphasia    Acute CVA (cerebrovascular accident) ([place]) 01/02/2021   Dizziness 12/18/2020   Tension headache 11/18/2020   Acute neck pain 11/18/2020   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   Splinter in skin, left foot 12/11/2019   Allergic rhinitis due to pollen 10/15/2019   Concentration deficit 08/14/2019   Non-cardiac chest pain 05/18/2019    Anxiety 04/13/2019   Weight 03/06/2019   Right flank pain 11/22/2018   Left shoulder pain 08/02/2018   Restless leg syndrome 12/29/2016   GERD (gastroesophageal reflux disease) 08/24/2016   Hemorrhoids 08/24/2016   Healthcare maintenance 08/24/2016   GAD (generalized anxiety disorder) 06/22/2016   Levoscoliosis 10/06/2015   Type 2 diabetes mellitus ([place]) 09/08/2015   Fatigue 04/03/2015   History of stroke 03/14/2015   IBS (irritable bowel syndrome) 09/29/2014   Hyperlipidemia 09/29/2014   Essential hypertension 09/29/2014    Bjorn Loser, PTA  03/02/21, 1:30 PM   [facility] [address] [place], [place], [address] Phone: [phone]   Fax:  [phone]  Name: Amber [name] MRN: [id number] Date of Birth: [birth year]

## 2021-03-02 NOTE — Therapy (Signed)
[facility] [address] [place], [place], [address] Phone: [phone]   Fax:  [phone]  Speech Language Pathology Treatment & Discharge Summary  Patient Details  Name: Amber [name] MRN: [id number] Date of Birth: 07-16-1970 Referring Provider (SLP): Lauraine Rinne PA   Encounter Date: 03/02/2021   End of Session - 03/02/21 1343     Visit Number 9    Number of Visits 25    Date for SLP Re-Evaluation 04/22/21    SLP Start Time 1145    SLP Stop Time  9244    SLP Time Calculation (min) 45 min    Activity Tolerance Patient tolerated treatment well             Past Medical History:  Diagnosis Date   Anxiety    self reported   Asthma    Depression    controlled   Diabetes mellitus without complication ([place])    Hyperlipidemia    controlled with medication   IBS (irritable bowel syndrome)    Slurred speech    Stroke ([place]) 03/13/2015   Tension headache 11/18/2020   Word finding difficulty 02/27/2018    Past Surgical History:  Procedure Laterality Date   ABDOMINAL HYSTERECTOMY  10/2006   bowel reconstruction  10/2006   with hysterectomy   CESAREAN SECTION  2005   EP IMPLANTABLE DEVICE N/A 03/17/2015   Procedure: Loop Recorder Insertion;  Surgeon: Will Meredith Leeds, MD;  Location: [facility];  Service: Cardiovascular;  Laterality: N/A;   TEE WITHOUT CARDIOVERSION N/A 03/17/2015   Procedure: TRANSESOPHAGEAL ECHOCARDIOGRAM (TEE);  Surgeon: Skeet Latch, MD;  Location: [facility];  Service: Cardiovascular;  Laterality: N/A;   TEE WITHOUT CARDIOVERSION N/A 01/06/2021   Procedure: TRANSESOPHAGEAL ECHOCARDIOGRAM (TEE);  Surgeon: Nelva Bush, MD;  Location: [facility];  Service: Cardiovascular;  Laterality: N/A;   TOE SURGERY     Left 2nd metatarsal    There were no vitals filed for this visit.   Subjective Assessment - 03/02/21 1154     Subjective "They denied my disability becasue I  wasn't there 1 year yet"    Currently in Pain? No/denies                   ADULT SLP TREATMENT - 03/02/21 1216       General Information   Behavior/Cognition Alert;Cooperative;Pleasant mood      Treatment Provided   Treatment provided Cognitive-Linquistic      Cognitive-Linquistic Treatment   Treatment focused on Apraxia;Aphasia;Patient/family/caregiver education    Skilled Treatment Complex conversation re: disability, SSI, caring for her daughter and her work - Amber [name] has improved fluency,  3 dysfluencies (words) over 25 minute conversation self corrected with mod I . Amber [name] is managing her to do list and appointments with a calendar and lists, she is compensating for word finding impairments with mod I, however none noted today.      Assessment / Recommendations / Plan   Plan Discharge SLP treatment due to (comment)      Progression Toward Goals   Progression toward goals Goals met, education completed, patient discharged from SLP              SLP Education - 03/02/21 1336     Education Details [facility] Aphasia study,  stroke survivor IG accounts/podcasts    Person(s) Educated Patient    Methods Explanation;Handout    Comprehension Verbalized understanding  SPEECH THERAPY DISCHARGE SUMMARY  Visits from Start of Care: 9  Current functional level related to goals / functional outcomes: Amber goals below   Remaining deficits: Mild high level aphasia   Education / Equipment: Compensations for aphasia, verbal apraxia and cognitive impairments, aphasia study at [facility]  Patient agrees to discharge. Patient goals were met. Patient is being discharged due to meeting the stated rehab goals.Marland Kitchen     SLP Short Term Goals - 03/02/21 1342       SLP SHORT TERM GOAL #1   Title Pt will complete HEP for verbal apraxia with occasional min A over 2 sessions    Baseline No HEP    Status Achieved      SLP SHORT TERM GOAL #2   Title Pt will name 15 items in  persoanlly relevant category with rare min A    Baseline 12 items;   02/11/21, 02/18/21    Status Achieved      SLP SHORT TERM GOAL #3   Title Pt will use pill organizer to independenlty manage medications over 1 weeks with mod I    Baseline daughter and mom A pt with meds, no pill organizer    Status Achieved      SLP SHORT TERM GOAL #4   Title Pt will demonstrate 4 or less dysfluencies in 8 minute conversatoin with rare min A    Baseline 8 dysfluencies    Time 3    Period Weeks    Status Achieved              SLP Long Term Goals - 03/02/21 1343       SLP LONG TERM GOAL #1   Title Pt will carryover verbal compensatory strategies for word finding episodes as needed in 15 minute conversation with rare min A    Baseline no compensatory strategies; 02-18-21, 02-25-21; 03/02/21    Time 9    Period Weeks    Status Achieved      SLP LONG TERM GOAL #2   Title Pt will demonstrate fluent speech over 15 minute converation with rare min A over 2 sessions    Baseline Pt dysfluent at sentence level    Time 9    Period Weeks    Status Achieved      SLP LONG TERM GOAL #3   Title Pt will utilize calendar and external aids to manage appointments, schedule, to do list with mod I over 2 sessions    Baseline daughter and mom managing her schedule/appointments    Time 9    Period Weeks    Status Achieved      SLP LONG TERM GOAL #4   Title Pt will double check and correct texts prior to sending then with supervision cues    Baseline Pt sending error and non sensical texts without awareness;    Status Achieved              Plan - 03/02/21 1336     Clinical Impression Statement Amber [name] continues to carryover fluent speech in complex conversations and compensating for word finding episodes. She is using compensations for auditory comprehension of complex information and family is helping as well. Amber [name] is managing her appointments with external aids. She verbaizes concern re: insurance  coverage for therapies and not getting disability at work. With fatigue, high cognitive load and complex information such as insurance/disability, Amber [name] aware that her speech, language and comprehension may worsen, therefore return to work not feasible at this time.  Amber [name] has met speech therapy goals. D/C ST at this time, pt in agreement. I provided her information re: aphasia study with Dr. Roney Mans SLP at [facility]. Amber [name] will contact [facility] speech department re: participating in this study as she will receive ST at no cost should she qualify.    Speech Therapy Frequency 2x / week    Duration 12 weeks    Treatment/Interventions Language facilitation;Environmental controls;Cueing hierarchy;SLP instruction and feedback;Compensatory strategies;Functional tasks;Cognitive reorganization;Compensatory techniques;Patient/family education;Multimodal communcation approach;Internal/external aids    Potential to Achieve Goals Good             Patient will benefit from skilled therapeutic intervention in order to improve the following deficits and impairments:   Aphasia  Verbal apraxia    Problem List Patient Active Problem List   Diagnosis Date Noted   Muscle cramps    Diabetes mellitus type II, controlled ([place])    Cramp in limb    Stroke (cerebrum) ([place]) 01/07/2021   Controlled type 2 diabetes mellitus with complication, without long-term current use of insulin ([facility])    Vascular headache    Dyslipidemia    Cryptogenic stroke ([place])    Expressive aphasia    Acute CVA (cerebrovascular accident) ([place]) 01/02/2021   Dizziness 12/18/2020   Tension headache 11/18/2020   Acute neck pain 11/18/2020   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   Splinter in skin, left foot 12/11/2019   Allergic rhinitis due to pollen 10/15/2019   Concentration deficit 08/14/2019   Non-cardiac chest pain 05/18/2019   Anxiety 04/13/2019   Weight 03/06/2019   Right flank pain 11/22/2018   Left shoulder pain 08/02/2018    Restless leg syndrome 12/29/2016   GERD (gastroesophageal reflux disease) 08/24/2016   Hemorrhoids 08/24/2016   Healthcare maintenance 08/24/2016   GAD (generalized anxiety disorder) 06/22/2016   Levoscoliosis 10/06/2015   Type 2 diabetes mellitus ([place]) 09/08/2015   Fatigue 04/03/2015   History of stroke 03/14/2015   IBS (irritable bowel syndrome) 09/29/2014   Hyperlipidemia 09/29/2014   Essential hypertension 09/29/2014    Amber [name], Amber Rusk MS, CCC-SLP 03/02/2021, 1:45 PM  [facility] [address] [place], [place], [address] Phone: [phone]   Fax:  [phone]   Name: Shamirah Ivan MRN: [id number] Date of Birth: 1971-04-05

## 2021-03-03 ENCOUNTER — Other Ambulatory Visit: Payer: Self-pay

## 2021-03-03 ENCOUNTER — Institutional Professional Consult (permissible substitution): Payer: BC Managed Care – PPO | Admitting: Neurology

## 2021-03-03 ENCOUNTER — Other Ambulatory Visit (INDEPENDENT_AMBULATORY_CARE_PROVIDER_SITE_OTHER): Payer: BC Managed Care – PPO

## 2021-03-03 ENCOUNTER — Telehealth (INDEPENDENT_AMBULATORY_CARE_PROVIDER_SITE_OTHER): Payer: BC Managed Care – PPO | Admitting: Primary Care

## 2021-03-03 ENCOUNTER — Encounter: Payer: Self-pay | Admitting: Primary Care

## 2021-03-03 DIAGNOSIS — F411 Generalized anxiety disorder: Secondary | ICD-10-CM | POA: Diagnosis not present

## 2021-03-03 DIAGNOSIS — Z8673 Personal history of transient ischemic attack (TIA), and cerebral infarction without residual deficits: Secondary | ICD-10-CM

## 2021-03-03 DIAGNOSIS — E785 Hyperlipidemia, unspecified: Secondary | ICD-10-CM

## 2021-03-03 LAB — LIPID PANEL
Cholesterol: 155 mg/dL (ref 0–200)
HDL: 64.9 mg/dL (ref >39.00)
LDL Cholesterol: 76 mg/dL (ref 0–99)
NonHDL: 90.09
Total CHOL/HDL Ratio: 2
Triglycerides: 72 mg/dL (ref 0.0–149.0)
VLDL: 14.4 mg/dL (ref 0.0–40.0)

## 2021-03-03 NOTE — Assessment & Plan Note (Signed)
Continue rosuvastatin 40 mg.  Repeat lipid panel pending. Goal LDL <70.

## 2021-03-03 NOTE — Assessment & Plan Note (Addendum)
Improving since last visit. Speech and expressive aphasia have significantly improved.  Agree to continue her out of work through January 8th, 2023 with a return to work date of January 9th, 2023. She will obtain clearance to return to work by her cardiologist and neurologist.   Continue physical and occupational therapy.  Continue to meet with psychologist.   Repeat lipid panel pending, goal LDL is <70. A1C is well controlled. Continue clopidogrel 75 mg.   She will update if anything changes.

## 2021-03-03 NOTE — Patient Instructions (Signed)
We will be in touch regarding your cholesterol level.  Follow up with your cardiologist and neurologist.   Continue with physical and occupational therapy.   We will see you in January 2023.

## 2021-03-03 NOTE — Progress Notes (Signed)
Patient ID: Amber [name], female    DOB: [birth year], 50 y.o.   MRN: [id number]  Virtual visit completed through [employer], a video enabled telemedicine application. Due to national recommendations of social distancing due to COVID-19, a virtual visit is felt to be most appropriate for this patient at this time. Reviewed limitations, risks, security and privacy concerns of performing a virtual visit and the availability of in person appointments. I also reviewed that there may be a patient responsible charge related to this service. The patient agreed to proceed.   Patient location: home Provider location: [place] at [facility], office Persons participating in this virtual visit: patient, provider \  If any vitals were documented, they were collected by patient at home unless specified below.    Ht 5\' 2"  (1.575 m)   Wt 172 lb (78 kg)   BMI 31.46 kg/m    CC: Follow up from CVA Subjective:   HPI: Amber [name] is a 50 y.o. female with a history of CVA x 2, hyperlipidemia, type 2 diabetes, GAD, expressive aphasia secondary to recent CVA presenting on 03/03/2021 for follow up of CVA from August 2022 and to discuss bleeding and bruising.   History of CVA in 2016 and August 2022. Managed on rosuvastatin 40 mg (increased by PCP a few months ago), clopidogrel 75 mg.   Following with neurology, last visit was 02/16/21, denied bleeding/bruising at the time, endorsed speech was improving. Recommendations during this visit were to continue clopidogrel 75 mg, goal LDL of <70, maintain BP <130/90, maintain A1C <6.5, and follow up in December 2022.   She has noticed a one week history of bruising to her legs, also easy bruising when she is touched with moderate pressure. She is compliant to her clopidogrel 75 mg.   She is actively following with physical, occupational, and speech therapy. She graduated from Speech therapy yesterday. She is not driving yet, has been approved to start driving with  another person in the car, plans on starting soon.   Evaluated by cardiology on 02/06/21 for chest tightness and stiffness to the left chest and arm. Prior loop recorder in place but is no longer functional. Chest pain was suspected to be MSK. She was referred to IR for removal of loop recorder. Metoprolol tartrate 25 mg BID was added. She may be considered for repeat loop recorder placement. She was advised to return in October 2022, she has an appointment scheduled for next week.   She is currently on FMLA from CVA in August 2022, scheduled to return on November 2nd, 2022. She repeated her lipid panel today, is due since we increased her dose of Crestor to 40 mg a few months ago. Goal LDL is <70.   Overall she is feeling better, trying to get back into her "groove". She has an appointment scheduled with a therapist to help her process through her recent stroke. Some anxiety and depression symptoms. She is sleeping and resting a lot, also gets out to her nephew and daughters games. Her mother is staying with her. Chest pain has improved. She still has to use her cane at times as she does have imbalance. She is out of her wheelchair.   She is not comfortable with returning to work until she is cleared from both her cardiologist and neurologist. She has a follow up appointment with her cardiologist next week and her neurologist in December 20th, 2022.      Relevant past medical, surgical, family and social history reviewed  and updated as indicated. Interim medical history since our last visit reviewed. Allergies and medications reviewed and updated. Outpatient Medications Prior to Visit  Medication Sig Dispense Refill   acetaminophen (TYLENOL) 325 MG tablet Take 1-2 tablets (325-650 mg total) by mouth every 4 (four) hours as needed for mild pain.     albuterol (VENTOLIN HFA) 108 (90 Base) MCG/ACT inhaler Inhale 2 puffs into the lungs every 4 (four) hours as needed for wheezing or shortness of breath  (cough, shortness of breath or wheezing.). 1 each 1   Ascorbic Acid (VITAMIN C) 1000 MG tablet Take 1 tablet (1,000 mg total) by mouth daily. 30 tablet 0   azelastine (ASTELIN) 0.1 % nasal spray Place 1 spray into both nostrils 2 (two) times daily. Use in each nostril as directed 30 mL 12   cholecalciferol (VITAMIN D3) 25 MCG (1000 UNIT) tablet Take 1 tablet (1,000 Units total) by mouth daily. 30 tablet 0   clopidogrel (PLAVIX) 75 MG tablet Take 1 tablet (75 mg total) by mouth daily. 90 tablet 0   famotidine (PEPCID) 20 MG tablet Take 1 tablet (20 mg total) by mouth 2 (two) times daily. For heartburn. 60 tablet 0   metFORMIN (GLUCOPHAGE-XR) 500 MG 24 hr tablet Take 500 mg by mouth daily with breakfast.     methocarbamol (ROBAXIN) 500 MG tablet Take 1 tablet (500 mg total) by mouth every 8 (eight) hours as needed for muscle spasms. 30 tablet 0   metoprolol succinate (TOPROL XL) 25 MG 24 hr tablet Take 1 tablet (25 mg total) by mouth daily. 30 tablet 6   Multiple Vitamins-Minerals (MULTIVITAMIN WITH MINERALS) tablet Take 1 tablet by mouth daily.     pantoprazole (PROTONIX) 40 MG tablet Take 1 tablet (40 mg total) by mouth daily. 30 tablet 3   rosuvastatin (CRESTOR) 40 MG tablet Take 1 tablet (40 mg total) by mouth daily. For cholesterol. 90 tablet 3   topiramate (TOPAMAX) 25 MG tablet Take 25 mg by mouth daily as needed. Only taking twice a week     metoprolol tartrate (LOPRESSOR) 100 MG tablet Take 1 tablet (100 mg) by mouth 2 hours prior to your Cardiac CT (Patient not taking: Reported on 03/03/2021) 1 tablet 0   oxyCODONE (OXY IR/ROXICODONE) 5 MG immediate release tablet Take 0.5 tablets (2.5 mg total) by mouth every 6 (six) hours as needed for severe pain. 30 tablet 0   No facility-administered medications prior to visit.     Per HPI unless specifically indicated in ROS section below Review of Systems  Respiratory:  Negative for shortness of breath.   Cardiovascular:  Negative for chest pain.   Neurological:  Negative for headaches.       Speech improving  Psychiatric/Behavioral:         Some anxiety and depression symptoms   Objective:  Ht 5\' 2"  (1.575 m)   Wt 172 lb (78 kg)   BMI 31.46 kg/m   Wt Readings from Last 3 Encounters:  03/03/21 172 lb (78 kg)  02/16/21 172 lb (78 kg)  02/06/21 172 lb (78 kg)       Physical exam: Gen: alert, NAD, not ill appearing Pulm: speaks in complete sentences without increased work of breathing Psych: normal mood, normal thought content  Neuro: Speech significantly improved today. Clear thought content.      Results for orders placed or performed during the hospital encounter of 02/03/21  CBC with Differential  Result Value Ref Range   WBC 5.0 4.0 -  10.5 K/uL   RBC 5.21 (H) 3.87 - 5.11 MIL/uL   Hemoglobin 14.7 12.0 - 15.0 g/dL   HCT 44.0 (H) 34.7 - 42.5 %   MCV 89.8 80.0 - 100.0 fL   MCH 28.2 26.0 - 34.0 pg   MCHC 31.4 30.0 - 36.0 g/dL   RDW 95.6 38.7 - 56.4 %   Platelets 249 150 - 400 K/uL   nRBC 0.0 0.0 - 0.2 %   Neutrophils Relative % 57 %   Neutro Abs 2.9 1.7 - 7.7 K/uL   Lymphocytes Relative 33 %   Lymphs Abs 1.7 0.7 - 4.0 K/uL   Monocytes Relative 7 %   Monocytes Absolute 0.3 0.1 - 1.0 K/uL   Eosinophils Relative 2 %   Eosinophils Absolute 0.1 0.0 - 0.5 K/uL   Basophils Relative 1 %   Basophils Absolute 0.0 0.0 - 0.1 K/uL   Immature Granulocytes 0 %   Abs Immature Granulocytes 0.01 0.00 - 0.07 K/uL  Basic metabolic panel  Result Value Ref Range   Sodium 138 135 - 145 mmol/L   Potassium 3.7 3.5 - 5.1 mmol/L   Chloride 105 98 - 111 mmol/L   CO2 25 22 - 32 mmol/L   Glucose, Bld 98 70 - 99 mg/dL   BUN 13 6 - 20 mg/dL   Creatinine, Ser 3.32 (H) 0.44 - 1.00 mg/dL   Calcium 95.1 8.9 - 88.4 mg/dL   GFR, Estimated >16 >60 mL/min   Anion gap 8 5 - 15  I-Stat Beta hCG blood, ED ([facility], [facility], [facility] only)  Result Value Ref Range   I-stat hCG, quantitative <5.0 <5 mIU/mL   Comment 3          Troponin I (High Sensitivity)   Result Value Ref Range   Troponin I (High Sensitivity) <2 <18 ng/L   Assessment & Plan:   Problem List Items Addressed This Visit       Other   Hyperlipidemia (Chronic)    Continue rosuvastatin 40 mg.  Repeat lipid panel pending. Goal LDL <70.      GAD (generalized anxiety disorder)    Agree with therapy, continue same.       History of CVA (cerebrovascular accident)    Improving since last visit. Speech and expressive aphasia have significantly improved.  Agree to continue her out of work through December 31st. She will obtain clearance to return to work by her cardiologist and neurologist.   Continue physical and occupational therapy.  Continue to meet with psychologist.   Repeat lipid panel pending, goal LDL is <70. A1C is well controlled. Continue clopidogrel 75 mg.   She will update if anything changes.         No orders of the defined types were placed in this encounter.  No orders of the defined types were placed in this encounter.   I discussed the assessment and treatment plan with the patient. The patient was provided an opportunity to ask questions and all were answered. The patient agreed with the plan and demonstrated an understanding of the instructions. The patient was advised to call back or seek an in-person evaluation if the symptoms worsen or if the condition fails to improve as anticipated.  Greater than 20 minutes was spent during face-to-face visit discussing follow up and prior appointments. Notes from cardiology and neurology reviewed.   Follow up plan:  We will be in touch regarding your cholesterol level.  Follow up with your cardiologist and neurologist.   Continue  with physical and occupational therapy.   We will see you in January 2023.   Doreene Nest, NP

## 2021-03-03 NOTE — Assessment & Plan Note (Signed)
Agree with therapy, continue same.

## 2021-03-04 ENCOUNTER — Ambulatory Visit: Payer: BC Managed Care – PPO | Admitting: Occupational Therapy

## 2021-03-04 ENCOUNTER — Encounter: Payer: Self-pay | Admitting: Primary Care

## 2021-03-04 ENCOUNTER — Encounter: Payer: Self-pay | Admitting: Occupational Therapy

## 2021-03-04 DIAGNOSIS — R2689 Other abnormalities of gait and mobility: Secondary | ICD-10-CM

## 2021-03-04 DIAGNOSIS — R41842 Visuospatial deficit: Secondary | ICD-10-CM

## 2021-03-04 DIAGNOSIS — M6281 Muscle weakness (generalized): Secondary | ICD-10-CM | POA: Diagnosis not present

## 2021-03-04 DIAGNOSIS — R41840 Attention and concentration deficit: Secondary | ICD-10-CM

## 2021-03-04 DIAGNOSIS — R41844 Frontal lobe and executive function deficit: Secondary | ICD-10-CM

## 2021-03-04 DIAGNOSIS — R278 Other lack of coordination: Secondary | ICD-10-CM

## 2021-03-04 DIAGNOSIS — I69351 Hemiplegia and hemiparesis following cerebral infarction affecting right dominant side: Secondary | ICD-10-CM
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
## 2021-03-06 ENCOUNTER — Other Ambulatory Visit: Payer: Self-pay

## 2021-03-06 ENCOUNTER — Ambulatory Visit: Payer: BC Managed Care – PPO

## 2021-03-06 DIAGNOSIS — M542 Cervicalgia: Secondary | ICD-10-CM

## 2021-03-06 DIAGNOSIS — R2689 Other abnormalities of gait and mobility: Secondary | ICD-10-CM

## 2021-03-06 DIAGNOSIS — M6281 Muscle weakness (generalized): Secondary | ICD-10-CM

## 2021-03-06 NOTE — Therapy (Signed)
[facility] [address] [place], [place], [address] Phone: [phone]   Fax:  [phone]  Physical Therapy Treatment  Patient Details  Name: Amber [name] MRN: [id number] Date of Birth: February 24, 1971 Referring Provider (PT): Jethro Bolus, PA-C   Encounter Date: 03/06/2021   PT End of Session - 03/06/21 0900     Visit Number 10    Number of Visits 17    Date for PT Re-Evaluation 03/25/21    Authorization Type BCBS primary; UHC MCD secondary (no auth required)    PT Start Time 0855    PT Stop Time 0935    PT Time Calculation (min) 40 min    Equipment Utilized During Treatment Gait belt    Activity Tolerance Patient tolerated treatment well    Behavior During Therapy [facility] for tasks assessed/performed             Past Medical History:  Diagnosis Date   Anxiety    self reported   Asthma    Depression    controlled   Diabetes mellitus without complication ([place])    Hyperlipidemia    controlled with medication   IBS (irritable bowel syndrome)    Slurred speech    Stroke (cerebrum) ([place]) 01/07/2021   Stroke ([place]) 03/13/2015   Tension headache 11/18/2020   Word finding difficulty 02/27/2018    Past Surgical History:  Procedure Laterality Date   ABDOMINAL HYSTERECTOMY  10/2006   bowel reconstruction  10/2006   with hysterectomy   CESAREAN SECTION  2005   EP IMPLANTABLE DEVICE N/A 03/17/2015   Procedure: Loop Recorder Insertion;  Surgeon: Will Meredith Leeds, MD;  Location: [place] CV LAB;  Service: Cardiovascular;  Laterality: N/A;   TEE WITHOUT CARDIOVERSION N/A 03/17/2015   Procedure: TRANSESOPHAGEAL ECHOCARDIOGRAM (TEE);  Surgeon: Skeet Latch, MD;  Location: [place];  Service: Cardiovascular;  Laterality: N/A;   TEE WITHOUT CARDIOVERSION N/A 01/06/2021   Procedure: TRANSESOPHAGEAL ECHOCARDIOGRAM (TEE);  Surgeon: Nelva Bush, MD;  Location: [facility] ORS;  Service: Cardiovascular;   Laterality: N/A;   TOE SURGERY     Left 2nd metatarsal    There were no vitals filed for this visit.   Subjective Assessment - 03/06/21 0900     Subjective Pt reports she is still having trouble sleeping at night. She is trying to walk more in community with st. cane. She got rid of her wheelchair.    Pertinent History history of CVA in 2016 and 2022 , type 2 diabetes, hypertension, hyperlipidemia    Limitations Standing;Walking;House hold activities    Currently in Pain? No/denies                Resisted walking: 1 x 460' with st. Cane and 2 yllow bands looped around ankles with posterior pull from PT, cues for longer steps and increased cadence Resisted walking: 2 yellow therabands around ankle with posterior pull, no AD, 1 x 460' Treadmill walking: harness for safety only - 0.58mh flat: 2' - 0/835m 5% incline: 4' - 1.2 mph flat: 2' - 1.2 mph 5% incline for 4'                          PT Short Term Goals - 02/23/21 1048       PT SHORT TERM GOAL #1   Title Pt will be able to ambulate 400 feet with st. cane on grass to navigate community    Baseline Met, 02/23/21  Time 4    Period Weeks    Status Achieved    Target Date 02/25/21      PT SHORT TERM GOAL #2   Title Pt will demo 5x sit to stand without HHA in under <25 seconds to improve overall functional strength    Baseline 17.47sec, 02/23/21    Time 4    Period Weeks    Status Achieved    Target Date 02/25/21      PT SHORT TERM GOAL #3   Title Patient will be able to go up and down stairs with reciprocal steps with use of 1 hand rail to improve navigating steps at home    Baseline Met; 02/23/21    Time 4    Period Weeks    Status Achieved    Target Date 02/25/21               PT Long Term Goals - 02/04/21 1454       PT LONG TERM GOAL #1   Title Pt will dem 5x sit to stand <20 seconds without HHA to improve functional strength with trasnfers    Baseline 33 sec (eval)    Time  8    Period Weeks    Status New      PT LONG TERM GOAL #2   Title Pt will be able to ambulate >1 m/s with LRAD to improve gait speed to improve community ambulation    Baseline 0.41ms with st. cane (eval)    Time 8    Period Weeks    Status New      PT LONG TERM GOAL #3   Title Pt will be able to ambulate on grass for 200 feet without AD and SBA to improve community negotiation    Baseline not tried (Eval)    Time 8    Period Weeks    Status New      PT LONG TERM GOAL #4   Title Pt will improve FGA score to >/=19/30 in order to indicate dec fall risk.    Time 8    Period Weeks    Status New                   Plan - 03/06/21 0932     Clinical Impression Statement Today's skilled session focused on progressing gait training without AD and with resisted walking and treadmill walking with incline and gradually increasing walking speed.    Personal Factors and Comorbidities Comorbidity 3+;Time since onset of injury/illness/exacerbation;Transportation    Comorbidities Privous hx of stroke, DM, asthma    Examination-Activity Limitations Caring for Others;Carry;Lift;Squat;Stairs;Stand;Transfers;Bend    Examination-Participation Restrictions Church;Cleaning;Community Activity;Driving;Laundry;Medication Management;Meal Prep;Occupation;Shop;Yard Work    Stability/Clinical Decision Making Stable/Uncomplicated    Rehab Potential Good    PT Frequency 2x / week    PT Duration 8 weeks    PT Treatment/Interventions ADLs/Self Care Home Management;Aquatic Therapy;Cryotherapy;Electrical Stimulation;Moist Heat;Traction;Gait training;Stair training;Functional mobility training;Therapeutic activities;Therapeutic exercise;Balance training;Neuromuscular re-education;Cognitive remediation;Patient/family education;Orthotic Fit/Training;Manual techniques;Passive range of motion;Energy conservation;Visual/perceptual remediation/compensation;Joint Manipulations;Spinal Manipulations    PT Next Visit  Plan Continue to add congnitive tasks with functional mobility and balance and gait without AD.    PT Home Exercise Plan [id number]    Consulted and Agree with Plan of Care Patient             Patient will benefit from skilled therapeutic intervention in order to improve the following deficits and impairments:  Abnormal gait, Decreased activity tolerance, Decreased balance, Decreased coordination, Decreased  endurance, Decreased mobility, Decreased safety awareness, Decreased strength, Difficulty walking, Impaired UE functional use, Impaired vision/preception, Postural dysfunction, Improper body mechanics, Pain  Visit Diagnosis: Other abnormalities of gait and mobility  Muscle weakness (generalized)  Neck pain     Problem List Patient Active Problem List   Diagnosis Date Noted   Muscle cramps    Diabetes mellitus type II, controlled ([place])    Cramp in limb    Controlled type 2 diabetes mellitus with complication, without long-term current use of insulin (HCC)    Vascular headache    Cryptogenic stroke (HCC)    Expressive aphasia    History of CVA (cerebrovascular accident) 01/02/2021   Dizziness 12/18/2020   Tension headache 11/18/2020   Acute neck pain 11/18/2020   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   Splinter in skin, left foot 12/11/2019   Allergic rhinitis due to pollen 10/15/2019   Concentration deficit 08/14/2019   Non-cardiac chest pain 05/18/2019   Right flank pain 11/22/2018   Left shoulder pain 08/02/2018   Restless leg syndrome 12/29/2016   GERD (gastroesophageal reflux disease) 08/24/2016   Hemorrhoids 08/24/2016   Healthcare maintenance 08/24/2016   GAD (generalized anxiety disorder) 06/22/2016   Levoscoliosis 10/06/2015   Type 2 diabetes mellitus ([place]) 09/08/2015   Fatigue 04/03/2015   History of stroke 03/14/2015   IBS (irritable bowel syndrome) 09/29/2014   Hyperlipidemia 09/29/2014   Essential hypertension 09/29/2014    Kerrie Pleasure,  PT 03/06/2021, 9:33 AM  [facility] [address] [place], [place], [address] Phone: [phone]   Fax:  [phone]  Name: [place] Jon MRN: [id number] Date of Birth: Mar 20, 1971

## 2021-03-07 MED ORDER — EZETIMIBE 10 MG PO TABS: 10.0000 mg | ORAL_TABLET | Freq: Every day | ORAL | 1 refills | Status: DC

## 2021-03-09 ENCOUNTER — Other Ambulatory Visit: Payer: Self-pay

## 2021-03-09 ENCOUNTER — Ambulatory Visit: Payer: BC Managed Care – PPO | Admitting: Physical Therapy

## 2021-03-09 ENCOUNTER — Encounter: Payer: Self-pay | Admitting: Physical Therapy

## 2021-03-09 ENCOUNTER — Other Ambulatory Visit: Payer: Self-pay | Admitting: Primary Care

## 2021-03-09 ENCOUNTER — Other Ambulatory Visit: Payer: Self-pay | Admitting: Internal Medicine

## 2021-03-09 DIAGNOSIS — R2689 Other abnormalities of gait and mobility: Secondary | ICD-10-CM

## 2021-03-09 DIAGNOSIS — M6281 Muscle weakness (generalized): Secondary | ICD-10-CM | POA: Diagnosis not present

## 2021-03-09 NOTE — Therapy (Signed)
[facility] [address] [place], [place], [address] Phone: [phone]   Fax:  [phone]  Physical Therapy Treatment  Patient Details  Name: Amber [name] MRN: [id number] Date of Birth: 01-May-1971 Referring Provider (PT): Jethro Bolus, PA-C   Encounter Date: 03/09/2021   PT End of Session - 03/09/21 1152     Visit Number 11    Number of Visits 17    Date for PT Re-Evaluation 03/25/21    Authorization Type BCBS primary; UHC MCD secondary (no auth required)    PT Start Time 1110    PT Stop Time 1150    PT Time Calculation (min) 40 min    Equipment Utilized During Treatment Gait belt    Activity Tolerance Patient tolerated treatment well    Behavior During Therapy [facility] for tasks assessed/performed             Past Medical History:  Diagnosis Date   Anxiety    self reported   Asthma    Depression    controlled   Diabetes mellitus without complication ([place])    Hyperlipidemia    controlled with medication   IBS (irritable bowel syndrome)    Slurred speech    Stroke (cerebrum) ([place]) 01/07/2021   Stroke ([place]) 03/13/2015   Tension headache 11/18/2020   Word finding difficulty 02/27/2018    Past Surgical History:  Procedure Laterality Date   ABDOMINAL HYSTERECTOMY  10/2006   bowel reconstruction  10/2006   with hysterectomy   CESAREAN SECTION  2005   EP IMPLANTABLE DEVICE N/A 03/17/2015   Procedure: Loop Recorder Insertion;  Surgeon: Will Meredith Leeds, MD;  Location: [facility];  Service: Cardiovascular;  Laterality: N/A;   TEE WITHOUT CARDIOVERSION N/A 03/17/2015   Procedure: TRANSESOPHAGEAL ECHOCARDIOGRAM (TEE);  Surgeon: Skeet Latch, MD;  Location: [place];  Service: Cardiovascular;  Laterality: N/A;   TEE WITHOUT CARDIOVERSION N/A 01/06/2021   Procedure: TRANSESOPHAGEAL ECHOCARDIOGRAM (TEE);  Surgeon: Nelva Bush, MD;  Location: [facility];  Service: Cardiovascular;   Laterality: N/A;   TOE SURGERY     Left 2nd metatarsal    There were no vitals filed for this visit.   Subjective Assessment - 03/09/21 1113     Subjective Pt reports going to church for the first time this past weekend and reports that it went well.  Still having problems sleeping at night. Feels restless at night and has messaged PCP about trouble sleeping.    Pertinent History history of CVA in 2016 and 2022 , type 2 diabetes, hypertension, hyperlipidemia    Limitations Standing;Walking;House hold activities    Currently in Pain? No/denies                               [facility] Adult PT Treatment/Exercise - 03/09/21 0001       Ambulation/Gait   Ambulation/Gait Yes    Ambulation/Gait Assistance 5: Supervision    Ambulation/Gait Assistance Details working in increasing speed and confidence amb on outdoor surfaces without AD.    Ambulation Distance (Feet) 500 Feet    Assistive device None    Gait Pattern Step-through pattern;Decreased arm swing - left;Trunk flexed    Ambulation Surface Paved;Gravel;Grass                 Balance Exercises - 03/09/21 0001       Balance Exercises: Standing   Standing, One Foot on a Step Eyes open;Head turns;6  inch   Alt taps, progressing with head turns/nods then upper trunk rotations, without UE support.   Other Standing Exercises rebounder: working on stepping strategies, multitasking, and working on more spontaneous movment.    Other Standing Exercises Comments gait while dribbling 242ft.                  PT Short Term Goals - 02/23/21 1048       PT SHORT TERM GOAL #1   Title Pt will be able to ambulate 400 feet with st. cane on grass to navigate community    Baseline Met, 02/23/21    Time 4    Period Weeks    Status Achieved    Target Date 02/25/21      PT SHORT TERM GOAL #2   Title Pt will demo 5x sit to stand without HHA in under <25 seconds to improve overall functional strength    Baseline  17.47sec, 02/23/21    Time 4    Period Weeks    Status Achieved    Target Date 02/25/21      PT SHORT TERM GOAL #3   Title Patient will be able to go up and down stairs with reciprocal steps with use of 1 hand rail to improve navigating steps at home    Baseline Met; 02/23/21    Time 4    Period Weeks    Status Achieved    Target Date 02/25/21               PT Long Term Goals - 02/04/21 1454       PT LONG TERM GOAL #1   Title Pt will dem 5x sit to stand <20 seconds without HHA to improve functional strength with trasnfers    Baseline 33 sec (eval)    Time 8    Period Weeks    Status New      PT LONG TERM GOAL #2   Title Pt will be able to ambulate >1 m/s with LRAD to improve gait speed to improve community ambulation    Baseline 0.69m/s with st. cane (eval)    Time 8    Period Weeks    Status New      PT LONG TERM GOAL #3   Title Pt will be able to ambulate on grass for 200 feet without AD and SBA to improve community negotiation    Baseline not tried (Eval)    Time 8    Period Weeks    Status New      PT LONG TERM GOAL #4   Title Pt will improve FGA score to >/=19/30 in order to indicate dec fall risk.    Time 8    Period Weeks    Status New                   Plan - 03/09/21 1155     Clinical Impression Statement Pt progressed demonstrating increase gait speed, UE swing, and overall confidence with balance tasks and gait on uneven, outdoor surfaces without an AD.    Personal Factors and Comorbidities Comorbidity 3+;Time since onset of injury/illness/exacerbation;Transportation    Comorbidities Privous hx of stroke, DM, asthma    Examination-Activity Limitations Caring for Others;Carry;Lift;Squat;Stairs;Stand;Transfers;Bend    Examination-Participation Restrictions Church;Cleaning;Community Activity;Driving;Laundry;Medication Management;Meal Prep;Occupation;Shop;Yard Work    Stability/Clinical Decision Making Stable/Uncomplicated    Rehab Potential  Good    PT Frequency 2x / week    PT Duration 8 weeks    PT  Treatment/Interventions ADLs/Self Care Home Management;Aquatic Therapy;Cryotherapy;Electrical Stimulation;Moist Heat;Traction;Gait training;Stair training;Functional mobility training;Therapeutic activities;Therapeutic exercise;Balance training;Neuromuscular re-education;Cognitive remediation;Patient/family education;Orthotic Fit/Training;Manual techniques;Passive range of motion;Energy conservation;Visual/perceptual remediation/compensation;Joint Manipulations;Spinal Manipulations    PT Next Visit Plan Continue to add congnitive tasks with functional mobility and balance and gait without AD.    PT Home Exercise Plan [email or web address]    Consulted and Agree with Plan of Care Patient             Patient will benefit from skilled therapeutic intervention in order to improve the following deficits and impairments:  Abnormal gait, Decreased activity tolerance, Decreased balance, Decreased coordination, Decreased endurance, Decreased mobility, Decreased safety awareness, Decreased strength, Difficulty walking, Impaired UE functional use, Impaired vision/preception, Postural dysfunction, Improper body mechanics, Pain  Visit Diagnosis: Muscle weakness (generalized)  Other abnormalities of gait and mobility     Problem List Patient Active Problem List   Diagnosis Date Noted   Muscle cramps    Diabetes mellitus type II, controlled ([place])    Cramp in limb    Controlled type 2 diabetes mellitus with complication, without long-term current use of insulin (HCC)    Vascular headache    Cryptogenic stroke (HCC)    Expressive aphasia    History of CVA (cerebrovascular accident) 01/02/2021   Dizziness 12/18/2020   Tension headache 11/18/2020   Acute neck pain 11/18/2020   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   Splinter in skin, left foot 12/11/2019   Allergic rhinitis due to pollen 10/15/2019   Concentration deficit 08/14/2019   Non-cardiac  chest pain 05/18/2019   Right flank pain 11/22/2018   Left shoulder pain 08/02/2018   Restless leg syndrome 12/29/2016   GERD (gastroesophageal reflux disease) 08/24/2016   Hemorrhoids 08/24/2016   Healthcare maintenance 08/24/2016   GAD (generalized anxiety disorder) 06/22/2016   Levoscoliosis 10/06/2015   Type 2 diabetes mellitus ([place]) 09/08/2015   Fatigue 04/03/2015   History of stroke 03/14/2015   IBS (irritable bowel syndrome) 09/29/2014   Hyperlipidemia 09/29/2014   Essential hypertension 09/29/2014    Bjorn Loser, PTA  03/09/21, 11:58 AM   [facility] [address] [place], [place], [address] Phone: [phone]   Fax:  [phone]  Name: Amber [name] MRN: [id number] Date of Birth: [birth year]

## 2021-03-10 MED ORDER — SERTRALINE HCL 25 MG PO TABS: 25.0000 mg | ORAL_TABLET | Freq: Every day | ORAL | 1 refills | Status: DC

## 2021-03-10 NOTE — Progress Notes (Deleted)
Electrophysiology Office Note:    Date:  03/10/2021   ID:  Amber [name], DOB January 21, 1971, MRN [id number]  PCP:  Amber Nest, NP  [facility] Cardiologist:  None  [facility] Electrophysiologist:  None   Referring MD: Amber Nest, NP   Chief Complaint: Cryptogenic stroke  History of Present Illness:    Amber [name] is a 50 y.o. female who presents for an evaluation of cryptogenic stroke at the request of Dr. Okey [name]. Their medical history includes recurrent strokes, hyperlipidemia, diabetes, asthma, anxiety, irritable bowel syndrome.  She was hospitalized in August 2022 with an acute stroke.  She has a loop recorder in place but it is no longer functional.  She is on Plavix and rosuvastatin for secondary prevention.  She is referred for consideration of repeat ILR placement.  Past Medical History:  Diagnosis Date   Anxiety    self reported   Asthma    Depression    controlled   Diabetes mellitus without complication (HCC)    Hyperlipidemia    controlled with medication   IBS (irritable bowel syndrome)    Slurred speech    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   Stroke (HCC) 03/13/2015   Tension headache 11/18/2020   Word finding difficulty 02/27/2018    Past Surgical History:  Procedure Laterality Date   ABDOMINAL HYSTERECTOMY  10/2006   bowel reconstruction  10/2006   with hysterectomy   CESAREAN SECTION  2005   EP IMPLANTABLE DEVICE N/A 03/17/2015   Procedure: Loop Recorder Insertion;  Surgeon: Amber Jorja Loa, MD;  [facility]: [facility];  Service: Cardiovascular;  Laterality: N/A;   TEE WITHOUT CARDIOVERSION N/A 03/17/2015   Procedure: TRANSESOPHAGEAL ECHOCARDIOGRAM (TEE);  Surgeon: Amber Si, MD;  Location: [facility];  Service: Cardiovascular;  Laterality: N/A;   TEE WITHOUT CARDIOVERSION N/A 01/06/2021   Procedure: TRANSESOPHAGEAL ECHOCARDIOGRAM (TEE);  Surgeon: Amber Kendall, MD;  Location: [facility];  Service: Cardiovascular;   Laterality: N/A;   TOE SURGERY     Left 2nd metatarsal    Current Medications: No outpatient medications have been marked as taking for the 03/11/21 encounter (Appointment) with Amber Prude, MD.     Allergies:   Keflex [cephalexin]   Social History   Socioeconomic History   Marital status: Single    Spouse name: Not on file   Number of children: Not on file   Years of education: Not on file   Highest education level: Not on file  Occupational History   Not on file  Tobacco Use   Smoking status: Never   Smokeless tobacco: Never  Vaping Use   Vaping Use: Never used  Substance and Sexual Activity   Alcohol use: Not Currently    Alcohol/week: 7.0 standard drinks    Types: 7 Glasses of wine per week    Comment: red wine occasionally but not since stroke   Drug use: Not Currently    Types: Marijuana    Comment: edibles-twice weekly   Sexual activity: Not Currently  Other Topics Concern   Not on file  Social History Narrative   Originally from [place]   Family lives up here in [place]   Has one daughter.   Enjoys spending time shopping and spending time with family    Social Determinants of Health   Financial Resource Strain: Not on file  Food Insecurity: Not on file  Transportation Needs: Not on file  Physical Activity: Not on file  Stress: Not on file  Social  Connections: Not on file     Family History: The patient's family history includes Arthritis in her father; Diabetes in her father, paternal grandfather, and paternal grandmother; Heart attack in her maternal grandfather and paternal grandfather; Hyperlipidemia in her father, maternal grandfather, and mother; Hypertension in her father; Prostate cancer in her father, maternal grandfather, and maternal uncle; Stroke in her paternal grandmother. There is no history of Colon cancer, Colon polyps, Stomach cancer, Esophageal cancer, or Pancreatic cancer.  ROS:   Please see the history of  present illness.    All other systems reviewed and are negative.  EKGs/Labs/Other Studies Reviewed:    The following studies were reviewed today: Prior notes  January 06, 2021 transesophageal echo Left ventricular function normal, 55% Right ventricular function normal No left atrial appendage thrombus Mild MR No interatrial shunt Linear echodensity in the aorta which was artifact.  CTA confirmed  February 12, 2021 CTA Coronary artery calcium score of 0      EKG:  The ekg ordered today demonstrates ***  Recent Labs: 09/16/2020: TSH 1.45 01/04/2021: Magnesium 2.1 01/08/2021: ALT 35 02/03/2021: BUN 13; Creatinine, Ser 1.09; Hemoglobin 14.7; Platelets 249; Potassium 3.7; Sodium 138  Recent Lipid Panel    Component Value Date/Time   CHOL 155 03/03/2021 0918   CHOL 184 08/28/2019 1537   TRIG 72.0 03/03/2021 0918   HDL 64.90 03/03/2021 0918   HDL 75 08/28/2019 1537   CHOLHDL 2 03/03/2021 0918   VLDL 14.4 03/03/2021 0918   LDLCALC 76 03/03/2021 0918   LDLCALC 85 08/28/2019 1537    Physical Exam:    VS:  There were no vitals taken for this visit.    Wt Readings from Last 3 Encounters:  03/03/21 172 lb (78 kg)  02/16/21 172 lb (78 kg)  02/06/21 172 lb (78 kg)     GEN: *** Well nourished, well developed in no acute distress HEENT: Normal NECK: No JVD; No carotid bruits LYMPHATICS: No lymphadenopathy CARDIAC: ***RRR, no murmurs, rubs, gallops RESPIRATORY:  Clear to auscultation without rales, wheezing or rhonchi  ABDOMEN: Soft, non-tender, non-distended MUSCULOSKELETAL:  No edema; No deformity  SKIN: Warm and dry NEUROLOGIC:  Alert and oriented x 3 PSYCHIATRIC:  Normal affect   ASSESSMENT:    No diagnosis found. PLAN:    In order of problems listed above:   Loop removal and reimplant      Total time spent with patient today *** minutes. This includes reviewing records, evaluating the patient and coordinating care.  Medication Adjustments/Labs and Tests  Ordered: Current medicines are reviewed at length with the patient today.  Concerns regarding medicines are outlined above.  No orders of the defined types were placed in this encounter.  No orders of the defined types were placed in this encounter.    Signed, Amber [name]. Amber Brothers, MD, [facility] 03/10/2021 9:36 PM    Electrophysiology [facility]

## 2021-03-11 ENCOUNTER — Telehealth: Payer: Self-pay

## 2021-03-11 ENCOUNTER — Other Ambulatory Visit: Payer: Self-pay

## 2021-03-11 ENCOUNTER — Ambulatory Visit (INDEPENDENT_AMBULATORY_CARE_PROVIDER_SITE_OTHER): Payer: BC Managed Care – PPO | Admitting: Internal Medicine

## 2021-03-11 ENCOUNTER — Ambulatory Visit: Payer: BC Managed Care – PPO | Admitting: Cardiology

## 2021-03-11 ENCOUNTER — Ambulatory Visit: Payer: BC Managed Care – PPO | Admitting: Gastroenterology

## 2021-03-11 ENCOUNTER — Encounter: Payer: Self-pay | Admitting: Internal Medicine

## 2021-03-11 VITALS — BP 100/80 | HR 80 | Ht 62.0 in | Wt 173.0 lb

## 2021-03-11 DIAGNOSIS — R072 Precordial pain: Secondary | ICD-10-CM | POA: Diagnosis not present

## 2021-03-11 DIAGNOSIS — R002 Palpitations: Secondary | ICD-10-CM | POA: Diagnosis not present

## 2021-03-11 DIAGNOSIS — E785 Hyperlipidemia, unspecified: Secondary | ICD-10-CM

## 2021-03-11 DIAGNOSIS — E1169 Type 2 diabetes mellitus with other specified complication: Secondary | ICD-10-CM

## 2021-03-11 DIAGNOSIS — I639 Cerebral infarction, unspecified: Secondary | ICD-10-CM

## 2021-03-11 MED ORDER — METOPROLOL SUCCINATE ER 25 MG PO TB24: 25.0000 mg | ORAL_TABLET | Freq: Every day | ORAL | 1 refills | Status: DC

## 2021-03-11 NOTE — Progress Notes (Signed)
Follow-up Outpatient Visit Date: 03/11/2021  Primary Care Provider: Doreene Nest, NP [address] [place] [address]  Chief Complaint: Follow-up chest pain and recurrent strokes  HPI:  Ms. Kennebrew is a 50 y.o. female with history of recurrent strokes, hyperlipidemia, type 2 diabetes mellitus, asthma, anxiety, and IBS, who presents for follow-up of chest pain.  I saw her a month ago for evaluation of chest pain following hospitalization for recurrent strokes.  TEE at that time was unremarkable.  We agreed to start metoprolol as well as arrange for coronary CTA.  This was normal without evidence of coronary artery disease or other significant extravascular findings.  Today, Ms. Brazill reports that she is feeling fairly well.  She has occasional palpitations though these seem to be better than at our last visit.  She was recently prescribed sertraline to help her relax.  She has not been sleeping well, often waking up in the early morning hours and being unable to go back to sleep.  She reports snoring a lot.  Sleep study in 2019 showed mild sleep apnea; CPAP was not recommended.  She has not had any further chest pain or shortness of breath.  She has not had any new focal neurologic deficits.  She was scheduled to see Dr. Lalla Brothers today for consideration of loop recorder removal and placement of a new device.  However, this was canceled as precertification for the procedures was not obtained.  At this point, Ms. Stryker is reluctant to proceed with loop recorder placement and inquires about other options for work-up of her recurrent strokes.  She remains out of work but is hoping to return in early January.  --------------------------------------------------------------------------------------------------  Cardiovascular History & Procedures: Cardiovascular Problems: Recurrent strokes Chest pain   Risk Factors: Hyperlipidemia, diabetes mellitus, stroke, and obesity    Cath/PCI: None   CV Surgery: None   EP Procedures and Devices: ILR (03/17/2015)   Non-Invasive Evaluation(s): TEE (01/06/2021): Normal LVEF.  Normal RV size and function.  No LA/LAA thrombus.  Mild MR.  No intracardiac shunt.  Linear echodensity in ascending aorta, favor artifact but cannot rule out aortopathy given h/o recurrent strokes. TTE (01/04/2021): LVEF 55-60% with grade 1 diastolic dysfunction.  Normal RV size and function.  Mild MR.  Negative bubble study.  Recent CV Pertinent Labs: Lab Results  Component Value Date   CHOL 155 03/03/2021   CHOL 184 08/28/2019   HDL 64.90 03/03/2021   HDL 75 08/28/2019   LDLCALC 76 03/03/2021   LDLCALC 85 08/28/2019   TRIG 72.0 03/03/2021   CHOLHDL 2 03/03/2021   INR 1.1 01/02/2021   K 3.7 02/03/2021   MG 2.1 01/04/2021   BUN 13 02/03/2021   BUN 12 12/17/2019   CREATININE 1.09 (H) 02/03/2021    Past medical and surgical history were reviewed and updated in EPIC.  Current Meds  Medication Sig   acetaminophen (TYLENOL) 325 MG tablet Take 1-2 tablets (325-650 mg total) by mouth every 4 (four) hours as needed for mild pain.   albuterol (VENTOLIN HFA) 108 (90 Base) MCG/ACT inhaler Inhale 2 puffs into the lungs every 4 (four) hours as needed for wheezing or shortness of breath (cough, shortness of breath or wheezing.).   Ascorbic Acid (VITAMIN C) 1000 MG tablet Take 1 tablet (1,000 mg total) by mouth daily.   azelastine (ASTELIN) 0.1 % nasal spray Place 1 spray into both nostrils 2 (two) times daily. Use in each nostril as directed   cholecalciferol (VITAMIN  D3) 25 MCG (1000 UNIT) tablet Take 1 tablet (1,000 Units total) by mouth daily.   clopidogrel (PLAVIX) 75 MG tablet Take 1 tablet by mouth once daily   ezetimibe (ZETIA) 10 MG tablet Take 1 tablet (10 mg total) by mouth daily. For cholesterol.   famotidine (PEPCID) 20 MG tablet Take 1 tablet (20 mg total) by mouth 2 (two) times daily. For heartburn.   metFORMIN (GLUCOPHAGE-XR) 500 MG  24 hr tablet Take 500 mg by mouth daily with breakfast.   metoprolol succinate (TOPROL XL) 25 MG 24 hr tablet Take 1 tablet (25 mg total) by mouth daily.   Multiple Vitamins-Minerals (MULTIVITAMIN WITH MINERALS) tablet Take 1 tablet by mouth daily.   pantoprazole (PROTONIX) 40 MG tablet Take 1 tablet (40 mg total) by mouth daily.   rosuvastatin (CRESTOR) 40 MG tablet Take 1 tablet (40 mg total) by mouth daily. For cholesterol.   sertraline (ZOLOFT) 25 MG tablet Take 1 tablet (25 mg total) by mouth daily. For anxiety.   topiramate (TOPAMAX) 25 MG tablet Take 25 mg by mouth daily as needed. Only taking twice a week    Allergies: Keflex [cephalexin]  Social History   Tobacco Use   Smoking status: Never   Smokeless tobacco: Never  Vaping Use   Vaping Use: Never used  Substance Use Topics   Alcohol use: Not Currently    Alcohol/week: 7.0 standard drinks    Types: 7 Glasses of wine per week    Comment: red wine occasionally but not since stroke   Drug use: Not Currently    Types: Marijuana    Comment: edibles-twice weekly    Family History  Problem Relation Age of Onset   Hyperlipidemia Mother    Arthritis Father    Diabetes Father    Hypertension Father    Hyperlipidemia Father    Prostate cancer Father    Prostate cancer Maternal Uncle        x 3   Heart attack Maternal Grandfather    Hyperlipidemia Maternal Grandfather    Prostate cancer Maternal Grandfather    Diabetes Paternal Grandmother    Stroke Paternal Grandmother    Diabetes Paternal Grandfather    Heart attack Paternal Grandfather    Colon cancer Neg Hx    Colon polyps Neg Hx    Stomach cancer Neg Hx    Esophageal cancer Neg Hx    Pancreatic cancer Neg Hx     Review of Systems: A 12-system review of systems was performed and was negative except as noted in the HPI.  --------------------------------------------------------------------------------------------------  Physical Exam: BP 100/80 (BP Location:  Left Arm, Patient Position: Sitting, Cuff Size: Normal)   Pulse 80   Ht 5\' 2"  (1.575 m)   Wt 173 lb (78.5 kg)   SpO2 98%   BMI 31.64 kg/m   General:  NAD.  Accompanied by her mother. Neck: No JVD or HJR. Lungs: Clear to auscultation bilaterally without wheezes or crackles. Heart: Regular rate and rhythm without murmurs, rubs, or gallops. Abdomen: Soft, nontender, nondistended. Extremities: No lower extremity edema.   Lab Results  Component Value Date   WBC 5.0 02/03/2021   HGB 14.7 02/03/2021   HCT 46.8 (H) 02/03/2021   MCV 89.8 02/03/2021   PLT 249 02/03/2021    Lab Results  Component Value Date   NA 138 02/03/2021   K 3.7 02/03/2021   CL 105 02/03/2021   CO2 25 02/03/2021   BUN 13 02/03/2021   CREATININE 1.09 (H) 02/03/2021  GLUCOSE 98 02/03/2021   ALT 35 01/08/2021    Lab Results  Component Value Date   CHOL 155 03/03/2021   HDL 64.90 03/03/2021   LDLCALC 76 03/03/2021   TRIG 72.0 03/03/2021   CHOLHDL 2 03/03/2021    --------------------------------------------------------------------------------------------------  ASSESSMENT AND PLAN: Recurrent strokes: No new focal neurologic deficits.  Prior extensive cardiac work-up including TEE x2 and ILR placement have been unrevealing.  Recent carotid Doppler showed only mild internal carotid artery stenosis.  We discussed the role for repeat cardiac monitoring either by Zio patch or ILR placement.  Ms. Coakley wishes to think about this more and will get back to [place] with her decision.  In the meantime, secondary prevention with clopidogrel and rosuvastatin should be continued.  Chest pain and palpitations: Palpitations have improved and chest pain resolved since our last visit.  Coronary CTA was normal without CAD or other significant findings to explain her symptoms.  We will plan to continue low-dose metoprolol.  Hyperlipidemia associated with type 2 diabetes mellitus: LDL just above goal on last check a week ago.   Continue rosuvastatin and ezetimibe for target LDL less than 70.  Ongoing management of diabetes mellitus per Ms. Clark.  Follow-up: Return to clinic in early 05/2021.  [name], MD 03/11/2021 11:55 AM

## 2021-03-11 NOTE — Telephone Encounter (Signed)
Outreach made to Pt.  Advised would need to reschedule potential procedure with Dr. Lalla Brothers d/t request not sent to precert.  Pt is not sure she wants loop removed.  Advised to keep appt with Dr. Okey Dupre today to discuss.

## 2021-03-11 NOTE — Patient Instructions (Signed)
Medication Instructions:   Your physician recommends that you continue on your current medications as directed. Please refer to the Current Medication list given to you today.  *If you need a refill on your cardiac medications before your next appointment, please call your pharmacy*   Lab Work:  None ordered  Testing/Procedures:    - None ordered today    - If you decide you would like to move forward with the Zio patch/monitor please call our office   Follow-Up: At [facility], you and your health needs are our priority.  As part of our continuing mission to provide you with exceptional heart care, we have created designated Provider Care Teams.  These Care Teams include your primary Cardiologist (physician) and Advanced Practice Providers (APPs -  Physician Assistants and Nurse Practitioners) who all work together to provide you with the care you need, when you need it.  We recommend signing up for the patient portal called "MyChart".  Sign up information is provided on this After Visit Summary.  MyChart is used to connect with patients for Virtual Visits (Telemedicine).  Patients are able to view lab/test results, encounter notes, upcoming appointments, etc.  Non-urgent messages can be sent to your provider as well.   To learn more about what you can do with MyChart, go to [email or web address].    Your next appointment:    Follow up first week of January  The format for your next appointment:   In Person  Provider:   You may see Dr. Cristal Deer End or one of the following Advanced Practice Providers on your designated Care Team:   Nicolasa Ducking, NP Eula Listen, PA-C Marisue Ivan, PA-C Cadence Marquette, [place]

## 2021-03-12 ENCOUNTER — Ambulatory Visit: Payer: BC Managed Care – PPO | Admitting: Occupational Therapy

## 2021-03-12 ENCOUNTER — Encounter: Payer: Self-pay | Admitting: Occupational Therapy

## 2021-03-12 ENCOUNTER — Ambulatory Visit: Payer: BC Managed Care – PPO

## 2021-03-12 DIAGNOSIS — I69351 Hemiplegia and hemiparesis following cerebral infarction affecting right dominant side: Secondary | ICD-10-CM

## 2021-03-12 DIAGNOSIS — R41842 Visuospatial deficit: Secondary | ICD-10-CM

## 2021-03-12 DIAGNOSIS — R278 Other lack of coordination: Secondary | ICD-10-CM

## 2021-03-12 DIAGNOSIS — M542 Cervicalgia: Secondary | ICD-10-CM

## 2021-03-12 DIAGNOSIS — R41844 Frontal lobe and executive function deficit: Secondary | ICD-10-CM

## 2021-03-12 DIAGNOSIS — M6281 Muscle weakness (generalized): Secondary | ICD-10-CM | POA: Diagnosis not present

## 2021-03-12 DIAGNOSIS — R2689 Other abnormalities of gait and mobility: Secondary | ICD-10-CM

## 2021-03-12 DIAGNOSIS — R41840 Attention and concentration deficit: Secondary | ICD-10-CM

## 2021-03-12 NOTE — Therapy (Signed)
[facility] [address] [place], [place], [address] Phone: [phone]   Fax:  [phone]  Physical Therapy Treatment  Patient Details  Name: Amber [name] MRN: [id number] Date of Birth: 1970/10/26 Referring Provider (PT): Jethro Bolus, PA-C   Encounter Date: 03/12/2021   PT End of Session - 03/12/21 1027     Visit Number 12    Number of Visits 17    Date for PT Re-Evaluation 03/25/21    Authorization Type BCBS primary; UHC MCD secondary (no auth required)    PT Start Time 1025    PT Stop Time 1105    PT Time Calculation (min) 40 min    Equipment Utilized During Treatment Gait belt    Activity Tolerance Patient tolerated treatment well    Behavior During Therapy WFL for tasks assessed/performed             Past Medical History:  Diagnosis Date   Anxiety    self reported   Asthma    Depression    controlled   Diabetes mellitus without complication ([place])    Hyperlipidemia    controlled with medication   IBS (irritable bowel syndrome)    Slurred speech    Stroke (cerebrum) ([place]) 01/07/2021   Stroke ([place]) 03/13/2015   Tension headache 11/18/2020   Word finding difficulty 02/27/2018    Past Surgical History:  Procedure Laterality Date   ABDOMINAL HYSTERECTOMY  10/2006   bowel reconstruction  10/2006   with hysterectomy   CESAREAN SECTION  2005   EP IMPLANTABLE DEVICE N/A 03/17/2015   Procedure: Loop Recorder Insertion;  Surgeon: Will Meredith Leeds, MD;  Location: [place] [facility];  Service: Cardiovascular;  Laterality: N/A;   TEE WITHOUT CARDIOVERSION N/A 03/17/2015   Procedure: TRANSESOPHAGEAL ECHOCARDIOGRAM (TEE);  Surgeon: Skeet Latch, MD;  Location: [facility];  Service: Cardiovascular;  Laterality: N/A;   TEE WITHOUT CARDIOVERSION N/A 01/06/2021   Procedure: TRANSESOPHAGEAL ECHOCARDIOGRAM (TEE);  Surgeon: Nelva Bush, MD;  Location: [facility] ORS;  Service: Cardiovascular;   Laterality: N/A;   TOE SURGERY     Left 2nd metatarsal    There were no vitals filed for this visit.   Subjective Assessment - 03/12/21 1028     Subjective Pt reports going to church for the first time this past weekend and reports that it went well.  Still having problems sleeping at night. Feels restless at night and has message PCP about trouble sleeping.    Pertinent History history of CVA in 2016 and 2022 , type 2 diabetes, hypertension, hyperlipidemia    Limitations Standing;Walking;House hold activities                 Treadmill training: Biodex harness for safety 4' on flat ground at 1.11mh without HHA 4' on 5% incline at 1.2 mph without HHA Rest, HR 88bpm after resting for 2' 4' on flat ground at 1.658m without HHA 4' on 5% incline at 1.52m952mwithout HHA HR during walk around 108 bpm Rest,  3' on flat ground at 2.0 mph without HHA 2' on flat ground at 2.4mp952mithout HHA 3' on 5% incline at 2.0mph73mthout HHA HR during walk 112bpm                          PT Short Term Goals - 02/23/21 1048       PT SHORT TERM GOAL #1   Title Pt will be able to ambulate 400 feet  with st. cane on grass to navigate community    Baseline Met, 02/23/21    Time 4    Period Weeks    Status Achieved    Target Date 02/25/21      PT SHORT TERM GOAL #2   Title Pt will demo 5x sit to stand without HHA in under <25 seconds to improve overall functional strength    Baseline 17.47sec, 02/23/21    Time 4    Period Weeks    Status Achieved    Target Date 02/25/21      PT SHORT TERM GOAL #3   Title Patient will be able to go up and down stairs with reciprocal steps with use of 1 hand rail to improve navigating steps at home    Baseline Met; 02/23/21    Time 4    Period Weeks    Status Achieved    Target Date 02/25/21               PT Long Term Goals - 02/04/21 1454       PT LONG TERM GOAL #1   Title Pt will dem 5x sit to stand <20 seconds without HHA  to improve functional strength with trasnfers    Baseline 33 sec (eval)    Time 8    Period Weeks    Status New      PT LONG TERM GOAL #2   Title Pt will be able to ambulate >1 m/s with LRAD to improve gait speed to improve community ambulation    Baseline 0.1ms with st. cane (eval)    Time 8    Period Weeks    Status New      PT LONG TERM GOAL #3   Title Pt will be able to ambulate on grass for 200 feet without AD and SBA to improve community negotiation    Baseline not tried (Eval)    Time 8    Period Weeks    Status New      PT LONG TERM GOAL #4   Title Pt will improve FGA score to >/=19/30 in order to indicate dec fall risk.    Time 8    Period Weeks    Status New                   Plan - 03/12/21 1046     Clinical Impression Statement Today's skilled session was focuse on cardivascualar endurance with gait training on treadmill with focus on improving gait speed and dynamic balance    Personal Factors and Comorbidities Comorbidity 3+;Time since onset of injury/illness/exacerbation;Transportation    Comorbidities Privous hx of stroke, DM, asthma    Examination-Activity Limitations Caring for Others;Carry;Lift;Squat;Stairs;Stand;Transfers;Bend    Examination-Participation Restrictions Church;Cleaning;Community Activity;Driving;Laundry;Medication Management;Meal Prep;Occupation;Shop;Yard Work    Stability/Clinical Decision Making Stable/Uncomplicated    Rehab Potential Good    PT Frequency 2x / week    PT Duration 8 weeks    PT Treatment/Interventions ADLs/Self Care Home Management;Aquatic Therapy;Cryotherapy;Electrical Stimulation;Moist Heat;Traction;Gait training;Stair training;Functional mobility training;Therapeutic activities;Therapeutic exercise;Balance training;Neuromuscular re-education;Cognitive remediation;Patient/family education;Orthotic Fit/Training;Manual techniques;Passive range of motion;Energy conservation;Visual/perceptual  remediation/compensation;Joint Manipulations;Spinal Manipulations    PT Next Visit Plan Continue to add congnitive tasks with functional mobility and balance and gait without AD.    PT Home Exercise Plan [email or web address]    Consulted and Agree with Plan of Care Patient             Patient will benefit from skilled therapeutic intervention in order to improve the  following deficits and impairments:  Abnormal gait, Decreased activity tolerance, Decreased balance, Decreased coordination, Decreased endurance, Decreased mobility, Decreased safety awareness, Decreased strength, Difficulty walking, Impaired UE functional use, Impaired vision/preception, Postural dysfunction, Improper body mechanics, Pain  Visit Diagnosis: Muscle weakness (generalized)  Neck pain  Other abnormalities of gait and mobility  Hemiplegia and hemiparesis following cerebral infarction affecting right dominant side [facility])     Problem List Patient Active Problem List   Diagnosis Date Noted   Palpitations 03/11/2021   Muscle cramps    Diabetes mellitus type II, controlled ([place])    Cramp in limb    Controlled type 2 diabetes mellitus with complication, without long-term current use of insulin (HCC)    Vascular headache    Recurrent strokes (HCC)    Expressive aphasia    History of CVA (cerebrovascular accident) 01/02/2021   Dizziness 12/18/2020   Tension headache 11/18/2020   Acute neck pain 11/18/2020   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   Splinter in skin, left foot 12/11/2019   Allergic rhinitis due to pollen 10/15/2019   Concentration deficit 08/14/2019   Non-cardiac chest pain 05/18/2019   Right flank pain 11/22/2018   Left shoulder pain 08/02/2018   Restless leg syndrome 12/29/2016   GERD (gastroesophageal reflux disease) 08/24/2016   Hemorrhoids 08/24/2016   Healthcare maintenance 08/24/2016   GAD (generalized anxiety disorder) 06/22/2016   Levoscoliosis 10/06/2015   Type 2 diabetes mellitus ([place])  09/08/2015   Fatigue 04/03/2015   History of stroke 03/14/2015   Precordial pain 09/29/2014   IBS (irritable bowel syndrome) 09/29/2014   Hyperlipidemia associated with type 2 diabetes mellitus ([place]) 09/29/2014   Essential hypertension 09/29/2014    Kerrie Pleasure, PT 03/12/2021, 11:03 AM  [facility] [address] [place], [place], [address] Phone: [phone]   Fax:  [phone]  Name: Amber [name] MRN: [id number] Date of Birth: May 18, 1971
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
## 2021-03-16 ENCOUNTER — Ambulatory Visit: Payer: BC Managed Care – PPO

## 2021-03-16 ENCOUNTER — Other Ambulatory Visit: Payer: Self-pay

## 2021-03-16 ENCOUNTER — Ambulatory Visit: Payer: BC Managed Care – PPO | Admitting: Occupational Therapy

## 2021-03-16 DIAGNOSIS — M6281 Muscle weakness (generalized): Secondary | ICD-10-CM

## 2021-03-16 DIAGNOSIS — I69351 Hemiplegia and hemiparesis following cerebral infarction affecting right dominant side: Secondary | ICD-10-CM

## 2021-03-16 DIAGNOSIS — M542 Cervicalgia: Secondary | ICD-10-CM

## 2021-03-16 DIAGNOSIS — R2689 Other abnormalities of gait and mobility: Secondary | ICD-10-CM

## 2021-03-16 DIAGNOSIS — R41840 Attention and concentration deficit: Secondary | ICD-10-CM

## 2021-03-16 DIAGNOSIS — R41844 Frontal lobe and executive function deficit: Secondary | ICD-10-CM

## 2021-03-16 DIAGNOSIS — R278 Other lack of coordination: Secondary | ICD-10-CM
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
## 2021-03-16 NOTE — Therapy (Signed)
[facility] [address] [place], [place], [address] Phone: [phone]   Fax:  [phone]  Physical Therapy Treatment  Patient Details  Name: Amber [name] MRN: [id number] Date of Birth: 1970/09/26 Referring Provider (PT): Jethro Bolus, PA-C   Encounter Date: 03/16/2021   PT End of Session - 03/16/21 1024     Visit Number 13    Number of Visits 17    Date for PT Re-Evaluation 03/25/21    Authorization Type BCBS primary; UHC MCD secondary (no auth required)    PT Start Time 1020    PT Stop Time 1100    PT Time Calculation (min) 40 min    Equipment Utilized During Treatment Gait belt    Activity Tolerance Patient tolerated treatment well    Behavior During Therapy WFL for tasks assessed/performed             Past Medical History:  Diagnosis Date   Anxiety    self reported   Asthma    Depression    controlled   Diabetes mellitus without complication ([place])    Hyperlipidemia    controlled with medication   IBS (irritable bowel syndrome)    Slurred speech    Stroke (cerebrum) ([place]) 01/07/2021   Stroke ([place]) 03/13/2015   Tension headache 11/18/2020   Word finding difficulty 02/27/2018    Past Surgical History:  Procedure Laterality Date   ABDOMINAL HYSTERECTOMY  10/2006   bowel reconstruction  10/2006   with hysterectomy   CESAREAN SECTION  2005   EP IMPLANTABLE DEVICE N/A 03/17/2015   Procedure: Loop Recorder Insertion;  Surgeon: Will Meredith Leeds, MD;  Location: [place] CV LAB;  Service: Cardiovascular;  Laterality: N/A;   TEE WITHOUT CARDIOVERSION N/A 03/17/2015   Procedure: TRANSESOPHAGEAL ECHOCARDIOGRAM (TEE);  Surgeon: Skeet Latch, MD;  Location: [facility];  Service: Cardiovascular;  Laterality: N/A;   TEE WITHOUT CARDIOVERSION N/A 01/06/2021   Procedure: TRANSESOPHAGEAL ECHOCARDIOGRAM (TEE);  Surgeon: Nelva Bush, MD;  Location: [facility] ORS;  Service: Cardiovascular;   Laterality: N/A;   TOE SURGERY     Left 2nd metatarsal    There were no vitals filed for this visit.   Subjective Assessment - 03/16/21 1025     Subjective Pt reports she was very active this week so she is tired.    Pertinent History history of CVA in 2016 and 2022 , type 2 diabetes, hypertension, hyperlipidemia    Limitations Standing;Walking;House hold activities    Currently in Pain? No/denies                Obstacle course: consisted of ramp, curb step, 4" box, 8" steps, 6" steps, 12" step, firm foam beam and soft soam beam for tandem walking: 4x Treadmill walking: bil HHA: - backward walking: 0.66mh 5' - sideways walking: 0.538m for 3' each ways                          PT Short Term Goals - 02/23/21 1048       PT SHORT TERM GOAL #1   Title Pt will be able to ambulate 400 feet with st. cane on grass to navigate community    Baseline Met, 02/23/21    Time 4    Period Weeks    Status Achieved    Target Date 02/25/21      PT SHORT TERM GOAL #2   Title Pt will demo 5x sit to stand without HHA in  under <25 seconds to improve overall functional strength    Baseline 17.47sec, 02/23/21    Time 4    Period Weeks    Status Achieved    Target Date 02/25/21      PT SHORT TERM GOAL #3   Title Patient will be able to go up and down stairs with reciprocal steps with use of 1 hand rail to improve navigating steps at home    Baseline Met; 02/23/21    Time 4    Period Weeks    Status Achieved    Target Date 02/25/21               PT Long Term Goals - 02/04/21 1454       PT LONG TERM GOAL #1   Title Pt will dem 5x sit to stand <20 seconds without HHA to improve functional strength with trasnfers    Baseline 33 sec (eval)    Time 8    Period Weeks    Status New      PT LONG TERM GOAL #2   Title Pt will be able to ambulate >1 m/s with LRAD to improve gait speed to improve community ambulation    Baseline 0.38ms with st. cane (eval)     Time 8    Period Weeks    Status New      PT LONG TERM GOAL #3   Title Pt will be able to ambulate on grass for 200 feet without AD and SBA to improve community negotiation    Baseline not tried (Eval)    Time 8    Period Weeks    Status New      PT LONG TERM GOAL #4   Title Pt will improve FGA score to >/=19/30 in order to indicate dec fall risk.    Time 8    Period Weeks    Status New                   Plan - 03/16/21 1025     Clinical Impression Statement Today's skilled session focused on dynamic gait training with stepping up, stepping on obstacles of various heights and compliant and non compliant surfaces without AD. Pt did well and required min A occaisonally for non compliant surfaces.    Personal Factors and Comorbidities Comorbidity 3+;Time since onset of injury/illness/exacerbation;Transportation    Comorbidities Privous hx of stroke, DM, asthma    Examination-Activity Limitations Caring for Others;Carry;Lift;Squat;Stairs;Stand;Transfers;Bend    Examination-Participation Restrictions Church;Cleaning;Community Activity;Driving;Laundry;Medication Management;Meal Prep;Occupation;Shop;Yard Work    Stability/Clinical Decision Making Stable/Uncomplicated    Rehab Potential Good    PT Frequency 2x / week    PT Duration 8 weeks    PT Treatment/Interventions ADLs/Self Care Home Management;Aquatic Therapy;Cryotherapy;Electrical Stimulation;Moist Heat;Traction;Gait training;Stair training;Functional mobility training;Therapeutic activities;Therapeutic exercise;Balance training;Neuromuscular re-education;Cognitive remediation;Patient/family education;Orthotic Fit/Training;Manual techniques;Passive range of motion;Energy conservation;Visual/perceptual remediation/compensation;Joint Manipulations;Spinal Manipulations    PT Next Visit Plan Continue to add congnitive tasks with functional mobility and balance and gait without AD.    PT Home Exercise Plan [email or web address]    Consulted  and Agree with Plan of Care Patient             Patient will benefit from skilled therapeutic intervention in order to improve the following deficits and impairments:  Abnormal gait, Decreased activity tolerance, Decreased balance, Decreased coordination, Decreased endurance, Decreased mobility, Decreased safety awareness, Decreased strength, Difficulty walking, Impaired UE functional use, Impaired vision/preception, Postural dysfunction, Improper body mechanics, Pain  Visit Diagnosis: Muscle weakness (generalized)  Neck pain  Other abnormalities of gait and mobility  Hemiplegia and hemiparesis following cerebral infarction affecting right dominant side [facility])     Problem List Patient Active Problem List   Diagnosis Date Noted   Palpitations 03/11/2021   Muscle cramps    Diabetes mellitus type II, controlled ([place])    Cramp in limb    Controlled type 2 diabetes mellitus with complication, without long-term current use of insulin (HCC)    Vascular headache    Recurrent strokes ([place])    Expressive aphasia    History of CVA (cerebrovascular accident) 01/02/2021   Dizziness 12/18/2020   Tension headache 11/18/2020   Acute neck pain 11/18/2020   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   Splinter in skin, left foot 12/11/2019   Allergic rhinitis due to pollen 10/15/2019   Concentration deficit 08/14/2019   Non-cardiac chest pain 05/18/2019   Right flank pain 11/22/2018   Left shoulder pain 08/02/2018   Restless leg syndrome 12/29/2016   GERD (gastroesophageal reflux disease) 08/24/2016   Hemorrhoids 08/24/2016   Healthcare maintenance 08/24/2016   GAD (generalized anxiety disorder) 06/22/2016   Levoscoliosis 10/06/2015   Type 2 diabetes mellitus ([place]) 09/08/2015   Fatigue 04/03/2015   History of stroke 03/14/2015   Precordial pain 09/29/2014   IBS (irritable bowel syndrome) 09/29/2014   Hyperlipidemia associated with type 2 diabetes mellitus ([place]) 09/29/2014   Essential  hypertension 09/29/2014    Kerrie Pleasure, PT 03/16/2021, 11:03 AM  [facility] [address] [place], [place], [address] Phone: [phone]   Fax:  [phone]  Name: Amber [name] MRN: [id number] Date of Birth: 30-Oct-1970

## 2021-03-17 DIAGNOSIS — I639 Cerebral infarction, unspecified: Secondary | ICD-10-CM | POA: Diagnosis not present

## 2021-03-18 ENCOUNTER — Ambulatory Visit: Payer: BC Managed Care – PPO

## 2021-03-18 ENCOUNTER — Other Ambulatory Visit: Payer: Self-pay

## 2021-03-18 DIAGNOSIS — M6281 Muscle weakness (generalized): Secondary | ICD-10-CM

## 2021-03-18 DIAGNOSIS — R2689 Other abnormalities of gait and mobility: Secondary | ICD-10-CM

## 2021-03-18 DIAGNOSIS — I69351 Hemiplegia and hemiparesis following cerebral infarction affecting right dominant side: Secondary | ICD-10-CM

## 2021-03-18 DIAGNOSIS — M542 Cervicalgia: Secondary | ICD-10-CM

## 2021-03-18 NOTE — Therapy (Signed)
[facility] [address] [place], [place], [address] Phone: [phone]   Fax:  [phone]  Physical Therapy Treatment  Patient Details  Name: Amber [name] MRN: [id number] Date of Birth: 1971/02/20 Referring Provider (PT): Jethro Bolus, PA-C   Encounter Date: 03/18/2021   PT End of Session - 03/18/21 0900     Visit Number 14    Number of Visits 17    Date for PT Re-Evaluation 03/25/21    Authorization Type BCBS primary; UHC MCD secondary (no auth required)    PT Start Time 0850    PT Stop Time 0935    PT Time Calculation (min) 45 min    Equipment Utilized During Treatment Gait belt    Activity Tolerance Patient tolerated treatment well    Behavior During Therapy [facility] for tasks assessed/performed             Past Medical History:  Diagnosis Date   Anxiety    self reported   Asthma    Depression    controlled   Diabetes mellitus without complication ([place])    Hyperlipidemia    controlled with medication   IBS (irritable bowel syndrome)    Slurred speech    Stroke (cerebrum) ([place]) 01/07/2021   Stroke ([place]) 03/13/2015   Tension headache 11/18/2020   Word finding difficulty 02/27/2018    Past Surgical History:  Procedure Laterality Date   ABDOMINAL HYSTERECTOMY  10/2006   bowel reconstruction  10/2006   with hysterectomy   CESAREAN SECTION  2005   EP IMPLANTABLE DEVICE N/A 03/17/2015   Procedure: Loop Recorder Insertion;  Surgeon: Will Meredith Leeds, MD;  Location: [place] [facility];  Service: Cardiovascular;  Laterality: N/A;   TEE WITHOUT CARDIOVERSION N/A 03/17/2015   Procedure: TRANSESOPHAGEAL ECHOCARDIOGRAM (TEE);  Surgeon: Skeet Latch, MD;  Location: [place];  Service: Cardiovascular;  Laterality: N/A;   TEE WITHOUT CARDIOVERSION N/A 01/06/2021   Procedure: TRANSESOPHAGEAL ECHOCARDIOGRAM (TEE);  Surgeon: Nelva Bush, MD;  Location: [facility];  Service: Cardiovascular;   Laterality: N/A;   TOE SURGERY     Left 2nd metatarsal    There were no vitals filed for this visit.   Subjective Assessment - 03/18/21 0903     Subjective Pt reports she didn't breing her cane in today. She is going to try to go places without her cane.    Pertinent History history of CVA in 2016 and 2022 , type 2 diabetes, hypertension, hyperlipidemia    Limitations Standing;Walking;House hold activities    Currently in Pain? No/denies                [facility] PT Assessment - 03/18/21 0931       Standardized Balance Assessment   Five times sit to stand comments  12    10 Meter Walk 0.91 m/s without AD            Gait training: Fwd walking:  3 mph 3' on 0% incline 3 mph 3' on 5% incline Rest 2.5 mph 3' on 0% incline 3.0 mph 5' on 0% incline Rest Walking bwd at 54mh on 0% incline 3' Walking laterally at 0.867m on 0% incline: 2' each side Rest Sit to stand: 2 x 5 1st trial: 17 sec, 12 sec  Walking on grass: 200 feet with SBA Worked on gait speed on level ground, cues for slight anterior lean to shift weight anteriorly 4 x 10 meters.  PT Short Term Goals - 02/23/21 1048       PT SHORT TERM GOAL #1   Title Pt will be able to ambulate 400 feet with st. cane on grass to navigate community    Baseline Met, 02/23/21    Time 4    Period Weeks    Status Achieved    Target Date 02/25/21      PT SHORT TERM GOAL #2   Title Pt will demo 5x sit to stand without HHA in under <25 seconds to improve overall functional strength    Baseline 17.47sec, 02/23/21    Time 4    Period Weeks    Status Achieved    Target Date 02/25/21      PT SHORT TERM GOAL #3   Title Patient will be able to go up and down stairs with reciprocal steps with use of 1 hand rail to improve navigating steps at home    Baseline Met; 02/23/21    Time 4    Period Weeks    Status Achieved    Target Date 02/25/21               PT Long Term Goals -  03/18/21 0931       PT LONG TERM GOAL #1   Title Pt will dem 5x sit to stand <20 seconds without HHA to improve functional strength with trasnfers    Baseline 33 sec (eval); 17 sec (03/18/21)    Time 8    Period Weeks    Status Achieved      PT LONG TERM GOAL #2   Title Pt will be able to ambulate >1 m/s with LRAD to improve gait speed to improve community ambulation    Baseline 0.58ms with st. cane (eval); 0.953m without AD (03/18/21)    Time 8    Period Weeks    Status On-going      PT LONG TERM GOAL #3   Title Pt will be able to ambulate on grass for 200 feet without AD and SBA to improve community negotiation    Baseline not tried (Eval); 200 feet (03/18/21)    Time 8    Period Weeks    Status Achieved      PT LONG TERM GOAL #4   Title Pt will improve FGA score to >/=19/30 in order to indicate dec fall risk.    Time 8    Period Weeks    Status New                   Plan - 03/18/21 0945     Clinical Impression Statement Patient is making good progress towards her long term goals. Pt met LTG 1 and 3. Pt is working towards her gait speed goal and FGA goal.    Personal Factors and Comorbidities Comorbidity 3+;Time since onset of injury/illness/exacerbation;Transportation    Comorbidities Privous hx of stroke, DM, asthma    Examination-Activity Limitations Caring for Others;Carry;Lift;Squat;Stairs;Stand;Transfers;Bend    Examination-Participation Restrictions Church;Cleaning;Community Activity;Driving;Laundry;Medication Management;Meal Prep;Occupation;Shop;Yard Work    Stability/Clinical Decision Making Stable/Uncomplicated    Rehab Potential Good    PT Frequency 2x / week    PT Duration 8 weeks    PT Treatment/Interventions ADLs/Self Care Home Management;Aquatic Therapy;Cryotherapy;Electrical Stimulation;Moist Heat;Traction;Gait training;Stair training;Functional mobility training;Therapeutic activities;Therapeutic exercise;Balance training;Neuromuscular  re-education;Cognitive remediation;Patient/family education;Orthotic Fit/Training;Manual techniques;Passive range of motion;Energy conservation;Visual/perceptual remediation/compensation;Joint Manipulations;Spinal Manipulations    PT Next Visit Plan Continue to add congnitive tasks with functional mobility and balance and gait without  AD.    PT Home Exercise Plan [email or web address]    Consulted and Agree with Plan of Care Patient             Patient will benefit from skilled therapeutic intervention in order to improve the following deficits and impairments:  Abnormal gait, Decreased activity tolerance, Decreased balance, Decreased coordination, Decreased endurance, Decreased mobility, Decreased safety awareness, Decreased strength, Difficulty walking, Impaired UE functional use, Impaired vision/preception, Postural dysfunction, Improper body mechanics, Pain  Visit Diagnosis: Muscle weakness (generalized)  Neck pain  Other abnormalities of gait and mobility  Hemiplegia and hemiparesis following cerebral infarction affecting right dominant side [facility])     Problem List Patient Active Problem List   Diagnosis Date Noted   Palpitations 03/11/2021   Muscle cramps    Diabetes mellitus type II, controlled ([place])    Cramp in limb    Controlled type 2 diabetes mellitus with complication, without long-term current use of insulin (HCC)    Vascular headache    Recurrent strokes (HCC)    Expressive aphasia    History of CVA (cerebrovascular accident) 01/02/2021   Dizziness 12/18/2020   Tension headache 11/18/2020   Acute neck pain 11/18/2020   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   Splinter in skin, left foot 12/11/2019   Allergic rhinitis due to pollen 10/15/2019   Concentration deficit 08/14/2019   Non-cardiac chest pain 05/18/2019   Right flank pain 11/22/2018   Left shoulder pain 08/02/2018   Restless leg syndrome 12/29/2016   GERD (gastroesophageal reflux disease) 08/24/2016   Hemorrhoids  08/24/2016   Healthcare maintenance 08/24/2016   GAD (generalized anxiety disorder) 06/22/2016   Levoscoliosis 10/06/2015   Type 2 diabetes mellitus ([place]) 09/08/2015   Fatigue 04/03/2015   History of stroke 03/14/2015   Precordial pain 09/29/2014   IBS (irritable bowel syndrome) 09/29/2014   Hyperlipidemia associated with type 2 diabetes mellitus ([place]) 09/29/2014   Essential hypertension 09/29/2014    Kerrie Pleasure, PT 03/18/2021, 9:46 AM  [facility] [address] [place], [place], [address] Phone: [phone]   Fax:  [phone]  Name: Amber [name] MRN: [id number] Date of Birth: [birth year]

## 2021-03-19 ENCOUNTER — Encounter: Payer: Self-pay | Admitting: Occupational Therapy

## 2021-03-19 ENCOUNTER — Ambulatory Visit: Payer: BC Managed Care – PPO | Admitting: Occupational Therapy

## 2021-03-19 DIAGNOSIS — R41844 Frontal lobe and executive function deficit: Secondary | ICD-10-CM

## 2021-03-19 DIAGNOSIS — R278 Other lack of coordination: Secondary | ICD-10-CM

## 2021-03-19 DIAGNOSIS — R41842 Visuospatial deficit: Secondary | ICD-10-CM

## 2021-03-19 DIAGNOSIS — I69351 Hemiplegia and hemiparesis following cerebral infarction affecting right dominant side: Secondary | ICD-10-CM

## 2021-03-19 DIAGNOSIS — M6281 Muscle weakness (generalized): Secondary | ICD-10-CM

## 2021-03-19 DIAGNOSIS — R41840 Attention and concentration deficit: Secondary | ICD-10-CM
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
## 2021-03-25 ENCOUNTER — Other Ambulatory Visit: Payer: Self-pay

## 2021-03-25 ENCOUNTER — Ambulatory Visit: Payer: BC Managed Care – PPO

## 2021-03-25 DIAGNOSIS — M542 Cervicalgia: Secondary | ICD-10-CM

## 2021-03-25 DIAGNOSIS — R2689 Other abnormalities of gait and mobility: Secondary | ICD-10-CM

## 2021-03-25 DIAGNOSIS — M6281 Muscle weakness (generalized): Secondary | ICD-10-CM | POA: Diagnosis not present

## 2021-03-25 DIAGNOSIS — I69351 Hemiplegia and hemiparesis following cerebral infarction affecting right dominant side: Secondary | ICD-10-CM

## 2021-03-25 NOTE — Therapy (Signed)
[facility] [address] [place], [place], [address] Phone: [phone]   Fax:  [phone]  Physical Therapy Discharge Summary  Patient Details  Name: Amber [name] MRN: [id number] Date of Birth: September 17, 1970 Referring Provider (PT): Jethro Bolus, PA-C   Encounter Date: 03/25/2021   PT End of Session - 03/25/21 0856     Visit Number 15    Number of Visits 17    Date for PT Re-Evaluation 03/25/21    Authorization Type BCBS primary; [employer] MCD secondary (no auth required)    PT Start Time 0850    PT Stop Time 0930    PT Time Calculation (min) 40 min    Equipment Utilized During Treatment Gait belt    Activity Tolerance Patient tolerated treatment well    Behavior During Therapy [facility] for tasks assessed/performed             Past Medical History:  Diagnosis Date   Anxiety    self reported   Asthma    Depression    controlled   Diabetes mellitus without complication ([place])    Hyperlipidemia    controlled with medication   IBS (irritable bowel syndrome)    Slurred speech    Stroke (cerebrum) ([place]) 01/07/2021   Stroke ([place]) 03/13/2015   Tension headache 11/18/2020   Word finding difficulty 02/27/2018    Past Surgical History:  Procedure Laterality Date   ABDOMINAL HYSTERECTOMY  10/2006   bowel reconstruction  10/2006   with hysterectomy   CESAREAN SECTION  2005   EP IMPLANTABLE DEVICE N/A 03/17/2015   Procedure: Loop Recorder Insertion;  Surgeon: Will Meredith Leeds, MD;  Location: [facility];  Service: Cardiovascular;  Laterality: N/A;   TEE WITHOUT CARDIOVERSION N/A 03/17/2015   Procedure: TRANSESOPHAGEAL ECHOCARDIOGRAM (TEE);  Surgeon: Skeet Latch, MD;  Location: [facility];  Service: Cardiovascular;  Laterality: N/A;   TEE WITHOUT CARDIOVERSION N/A 01/06/2021   Procedure: TRANSESOPHAGEAL ECHOCARDIOGRAM (TEE);  Surgeon: Nelva Bush, MD;  Location: [facility];  Service: Cardiovascular;   Laterality: N/A;   TOE SURGERY     Left 2nd metatarsal    There were no vitals filed for this visit.       [facility] PT Assessment - 03/25/21 0001       Standardized Balance Assessment   10 Meter Walk 0.28ms no AD      Functional Gait  Assessment   Gait Level Surface Walks 20 ft in less than 5.5 sec, no assistive devices, good speed, no evidence for imbalance, normal gait pattern, deviates no more than 6 in outside of the 12 in walkway width.    Change in Gait Speed Able to smoothly change walking speed without loss of balance or gait deviation. Deviate no more than 6 in outside of the 12 in walkway width.    Gait with Horizontal Head Turns Performs head turns smoothly with no change in gait. Deviates no more than 6 in outside 12 in walkway width    Gait with Vertical Head Turns Performs head turns with no change in gait. Deviates no more than 6 in outside 12 in walkway width.    Gait and Pivot Turn Pivot turns safely within 3 sec and stops quickly with no loss of balance.    Step Over Obstacle Is able to step over 2 stacked shoe boxes taped together (9 in total height) without changing gait speed. No evidence of imbalance.    Gait with Narrow Base of Support Is able  to ambulate for 10 steps heel to toe with no staggering.    Gait with Eyes Closed Walks 20 ft, uses assistive device, slower speed, mild gait deviations, deviates 6-10 in outside 12 in walkway width. Ambulates 20 ft in less than 9 sec but greater than 7 sec.    Ambulating Backwards Walks 20 ft, uses assistive device, slower speed, mild gait deviations, deviates 6-10 in outside 12 in walkway width.    Steps Alternating feet, no rail.    Total Score 28                Reassessed goals. Patient given driving education evaluation resounces Pt educated on moderate intensity exercises of 20-30 min everyday to maintain progress (walking)                      PT Short Term Goals - 03/25/21 0857        PT SHORT TERM GOAL #1   Title Pt will be able to ambulate 400 feet with st. cane on grass to navigate community    Baseline Met, 02/23/21    Time 4    Period Weeks    Status Achieved    Target Date 02/25/21      PT SHORT TERM GOAL #2   Title Pt will demo 5x sit to stand without HHA in under <25 seconds to improve overall functional strength    Baseline 17.47sec, 02/23/21    Time 4    Period Weeks    Status Achieved    Target Date 02/25/21      PT SHORT TERM GOAL #3   Title Patient will be able to go up and down stairs with reciprocal steps with use of 1 hand rail to improve navigating steps at home    Baseline Met; 02/23/21    Time 4    Period Weeks    Status Achieved    Target Date 02/25/21               PT Long Term Goals - 03/25/21 0857       PT LONG TERM GOAL #1   Title Pt will dem 5x sit to stand <20 seconds without HHA to improve functional strength with trasnfers    Baseline 33 sec (eval); 17 sec (03/18/21)    Time 8    Period Weeks    Status Achieved      PT LONG TERM GOAL #2   Title Pt will be able to ambulate >1 m/s with LRAD to improve gait speed to improve community ambulation    Baseline 0.55ms with st. cane (eval); 0.971m without AD (03/18/21); 0.95 m/s (comfortable walking speed)    Time 8    Period Weeks    Status Not Met      PT LONG TERM GOAL #3   Title Pt will be able to ambulate on grass for 200 feet without AD and SBA to improve community negotiation    Baseline not tried (Eval); 200 feet (03/18/21)    Time 8    Period Weeks    Status Achieved      PT LONG TERM GOAL #4   Title Pt will improve FGA score to >/=19/30 in order to indicate dec fall risk.    Baseline 28/30 03/25/21    Time 8    Period Weeks    Status Achieved                   Plan -  03/25/21 0900     Clinical Impression Statement Patient has been seen for total of 15 sessions from 01/28/21 to 03/25/21. Patient has met all of her short term and long term goals.  Patient will be discharged from skilled physical therapy.    Personal Factors and Comorbidities Comorbidity 3+;Time since onset of injury/illness/exacerbation;Transportation    Comorbidities Privous hx of stroke, DM, asthma    Examination-Activity Limitations Caring for Others;Carry;Lift;Squat;Stairs;Stand;Transfers;Bend    Examination-Participation Restrictions Church;Cleaning;Community Activity;Driving;Laundry;Medication Management;Meal Prep;Occupation;Shop;Yard Work    Stability/Clinical Decision Making Stable/Uncomplicated    Rehab Potential Good    PT Frequency 2x / week    PT Duration 8 weeks    PT Treatment/Interventions ADLs/Self Care Home Management;Aquatic Therapy;Cryotherapy;Electrical Stimulation;Moist Heat;Traction;Gait training;Stair training;Functional mobility training;Therapeutic activities;Therapeutic exercise;Balance training;Neuromuscular re-education;Cognitive remediation;Patient/family education;Orthotic Fit/Training;Manual techniques;Passive range of motion;Energy conservation;Visual/perceptual remediation/compensation;Joint Manipulations;Spinal Manipulations    PT Next Visit Plan Continue to add congnitive tasks with functional mobility and balance and gait without AD.    PT Home Exercise Plan [id number]    Consulted and Agree with Plan of Care Patient             Patient will benefit from skilled therapeutic intervention in order to improve the following deficits and impairments:  Abnormal gait, Decreased activity tolerance, Decreased balance, Decreased coordination, Decreased endurance, Decreased mobility, Decreased safety awareness, Decreased strength, Difficulty walking, Impaired UE functional use, Impaired vision/preception, Postural dysfunction, Improper body mechanics, Pain  Visit Diagnosis: Other abnormalities of gait and mobility  Muscle weakness (generalized)  Hemiplegia and hemiparesis following cerebral infarction affecting right dominant side  (HCC)  Neck pain     Problem List Patient Active Problem List   Diagnosis Date Noted   Palpitations 03/11/2021   Muscle cramps    Diabetes mellitus type II, controlled ([place])    Cramp in limb    Controlled type 2 diabetes mellitus with complication, without long-term current use of insulin (HCC)    Vascular headache    Recurrent strokes (HCC)    Expressive aphasia    History of CVA (cerebrovascular accident) 01/02/2021   Dizziness 12/18/2020   Tension headache 11/18/2020   Acute neck pain 11/18/2020   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   Splinter in skin, left foot 12/11/2019   Allergic rhinitis due to pollen 10/15/2019   Concentration deficit 08/14/2019   Non-cardiac chest pain 05/18/2019   Right flank pain 11/22/2018   Left shoulder pain 08/02/2018   Restless leg syndrome 12/29/2016   GERD (gastroesophageal reflux disease) 08/24/2016   Hemorrhoids 08/24/2016   Healthcare maintenance 08/24/2016   GAD (generalized anxiety disorder) 06/22/2016   Levoscoliosis 10/06/2015   Type 2 diabetes mellitus ([place]) 09/08/2015   Fatigue 04/03/2015   History of stroke 03/14/2015   Precordial pain 09/29/2014   IBS (irritable bowel syndrome) 09/29/2014   Hyperlipidemia associated with type 2 diabetes mellitus ([place]) 09/29/2014   Essential hypertension 09/29/2014    Kerrie Pleasure, PT 03/25/2021, 9:33 AM  [facility] [address] [place], [place], [address] Phone: [phone]   Fax:  [phone]  Name: Amber [name] MRN: [id number] Date of Birth: 12-24-1970

## 2021-03-25 NOTE — Patient Instructions (Signed)
Local Driver Evaluation Programs: ° °Comprehensive Evaluation: includes clinical and in vehicle behind the wheel testing by OCCUPATIONAL THERAPIST. Programs have varying levels of adaptive controls available for trial.  ° °[facility] °[address] °[place], [place]  [address] °[phone] or [phone] °http://www.driver-rehab.com °Evaluator:  Cyndee Crompton, OT/CDRS/CDI/SCDCM/Low Vision Certification ° °[facility] °[address] °[place], [place] [address] °[phone] °https://www.[facility].org/home/services/rehabilitation.aspx °Evaluators:  Shannon Sheek, OT and Jill Tucker, OT ° °[facility] [place] [facility] (ONLY SERVES VETERANS!!) °[facility] °[address] °[place], [place]  [address] °[phone] °http://www.[place].va.gov/services/Physical_Medicine_Rehabilitation_Services.asp °Evaluators:  Eric Andrews, KT; Heidi Harris, KT;  Gary Whitaker, KT (KT=kiniesotherapist) ° ° °Clinical evaluations only:  Includes clinical testing, refers to other programs or local certified driving instructor for behind the wheel testing. ° °[facility] (outpatient Rehab) °[facility] °[address] °[place], [place] [address] °[phone] for scheduling °http://[email or web address]/Outpatient-Rehabilitation/Neurorehabilitation-Therapy.htm °Evaluators:  Kelly Lambeth, OT; Kate Phillips, OT ° °Other area clinical evaluators available upon request including [facility], [facility] and [facility]. ° ° °    Resource List °What is a Driver Evaluation: °Your Road Ahead - A Guide to Comprehensive Driving Evaluations °http://www.thehartford.com/resources/mature-market-excellence/publications-on-aging ° °[employer] for [employer] - Disability and Driving Fact Sheets °http://www.aded.net/?page=510 ° °Driving after a Brain  Injury: °[employer] °http://www.biausa.org/tbims-abstracts/if-there-is-an-effective-way-to-determine-if-someone-is-ready-to-drive-after-tbi?A=SearchResult&SearchID=9495675&ObjectID=2758842&ObjectType=35 ° °Driving with Adaptive Equipment: °[facility] °http://www.driver-rehab.com/adaptive-equipment ° °[employer] °http://www.nmeda.com/ ° ° ° ° ° ° °  °

## 2021-03-26 ENCOUNTER — Encounter: Payer: Self-pay | Admitting: Physical Medicine & Rehabilitation

## 2021-03-26 ENCOUNTER — Encounter: Payer: BC Managed Care – PPO | Attending: Registered Nurse | Admitting: Physical Medicine & Rehabilitation

## 2021-03-26 ENCOUNTER — Other Ambulatory Visit: Payer: Self-pay

## 2021-03-26 VITALS — BP 107/75 | HR 69 | Ht 62.0 in | Wt 171.4 lb

## 2021-03-26 DIAGNOSIS — I69398 Other sequelae of cerebral infarction: Secondary | ICD-10-CM

## 2021-03-26 DIAGNOSIS — R4701 Aphasia: Secondary | ICD-10-CM | POA: Diagnosis not present

## 2021-03-26 DIAGNOSIS — R269 Unspecified abnormalities of gait and mobility: Secondary | ICD-10-CM | POA: Diagnosis not present
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
## 2021-04-01 ENCOUNTER — Telehealth: Payer: Self-pay | Admitting: Primary Care

## 2021-04-01 DIAGNOSIS — E119 Type 2 diabetes mellitus without complications: Secondary | ICD-10-CM

## 2021-04-01 DIAGNOSIS — R519 Headache, unspecified: Secondary | ICD-10-CM

## 2021-04-01 NOTE — Telephone Encounter (Signed)
See my chart message

## 2021-04-01 NOTE — Telephone Encounter (Signed)
  Encourage patient to contact the pharmacy for refills or they can request refills through [place]:  Please schedule appointment if longer than 1 year  NEXT APPOINTMENT DATE:06/16/21  MEDICATION:topiramate, libre sensor kit-more needles  Is the patient out of medication? yes  PHARMACY:[facility] [address] [place]  Let patient know to contact pharmacy at the end of the day to make sure medication is ready.  Please notify patient to allow 48-72 hours to process  CLINICAL FILLS OUT ALL BELOW:   LAST REFILL:  QTY:  REFILL DATE:    OTHER COMMENTS:    Okay for refill?  Please advise

## 2021-04-02 MED ORDER — TOPIRAMATE 25 MG PO TABS: 25.0000 mg | ORAL_TABLET | Freq: Every day | ORAL | 3 refills | Status: DC

## 2021-04-02 MED ORDER — FREESTYLE LIBRE 2 READER DEVI: 0 refills | Status: DC

## 2021-04-02 MED ORDER — FREESTYLE LIBRE 2 SENSOR MISC: 1 refills | Status: DC

## 2021-04-06 ENCOUNTER — Other Ambulatory Visit: Payer: Self-pay

## 2021-04-06 DIAGNOSIS — R519 Headache, unspecified: Secondary | ICD-10-CM

## 2021-04-06 NOTE — Telephone Encounter (Signed)
[facility] pharmacy sent a refill request for Topiramate. Would you like to refill?

## 2021-04-07 NOTE — Telephone Encounter (Signed)
Looks like PCP filled topamax

## 2021-04-12 ENCOUNTER — Other Ambulatory Visit: Payer: Self-pay | Admitting: Primary Care

## 2021-04-12 DIAGNOSIS — E119 Type 2 diabetes mellitus without complications: Secondary | ICD-10-CM

## 2021-04-14 DIAGNOSIS — F0630 Mood disorder due to known physiological condition, unspecified: Secondary | ICD-10-CM | POA: Insufficient documentation

## 2021-04-17 DIAGNOSIS — I639 Cerebral infarction, unspecified: Secondary | ICD-10-CM | POA: Diagnosis not present

## 2021-04-21 DIAGNOSIS — M62838 Other muscle spasm: Secondary | ICD-10-CM

## 2021-04-21 MED ORDER — METHOCARBAMOL 500 MG PO TABS: 500.0000 mg | ORAL_TABLET | Freq: Three times a day (TID) | ORAL | 0 refills | Status: DC | PRN

## 2021-05-17 DIAGNOSIS — I639 Cerebral infarction, unspecified: Secondary | ICD-10-CM | POA: Diagnosis not present

## 2021-05-19 ENCOUNTER — Ambulatory Visit: Payer: BC Managed Care – PPO | Admitting: Adult Health

## 2021-05-28 ENCOUNTER — Telehealth (INDEPENDENT_AMBULATORY_CARE_PROVIDER_SITE_OTHER): Payer: BC Managed Care – PPO | Admitting: Family

## 2021-05-28 ENCOUNTER — Encounter: Payer: Self-pay | Admitting: Family

## 2021-05-28 ENCOUNTER — Other Ambulatory Visit: Payer: Self-pay

## 2021-05-28 VITALS — Ht 62.0 in | Wt 170.0 lb

## 2021-05-28 DIAGNOSIS — R051 Acute cough: Secondary | ICD-10-CM | POA: Diagnosis not present

## 2021-05-28 DIAGNOSIS — Z20828 Contact with and (suspected) exposure to other viral communicable diseases: Secondary | ICD-10-CM | POA: Insufficient documentation

## 2021-05-28 DIAGNOSIS — J029 Acute pharyngitis, unspecified: Secondary | ICD-10-CM | POA: Insufficient documentation

## 2021-05-28 LAB — HM MAMMOGRAPHY

## 2021-05-28 MED ORDER — AMOXICILLIN-POT CLAVULANATE 875-125 MG PO TABS: 1.0000 | ORAL_TABLET | Freq: Two times a day (BID) | ORAL | 0 refills | Status: AC

## 2021-05-28 NOTE — Assessment & Plan Note (Signed)
Testing for flu and covid, pending results.

## 2021-05-28 NOTE — Progress Notes (Addendum)
MyChart Video Visit    Virtual Visit via Video Note   This visit type was conducted due to national recommendations for restrictions regarding the COVID-19 Pandemic (e.g. social distancing) in an effort to limit this patient's exposure and mitigate transmission in our community. This patient is at least at moderate risk for complications without adequate follow up. This format is felt to be most appropriate for this patient at this time. Physical exam was limited by quality of the video and audio technology used for the visit. CMA was able to get the patient set up on a video visit.  Patient location: Home. Patient and provider in visit Provider location: Office  I discussed the limitations of evaluation and management by telemedicine and the availability of in person appointments. The patient expressed understanding and agreed to proceed.  Visit Date: 05/28/2021  Today's healthcare provider: Mort Sawyers, FNP     Subjective:    Patient ID: Amber [name], female    DOB: 1970/07/09, 50 y.o.   MRN: [id number]  Chief Complaint  Patient presents with   Cough   Sore Throat   Nasal Congestion    Cough Associated symptoms include a sore throat. Pertinent negatives include no chest pain, chills, ear pain, fever, shortness of breath or wheezing.  Sore Throat  Associated symptoms include congestion and coughing. Pertinent negatives include no ear pain or shortness of breath.   50 y/o female with c/o wet productive cough (which is improving), sore throat, nasal congestion, and post nasal drip.  Cough seems to worsen at night time, and is a dry hacking cough.  No sob.  No fever and or chills. No headache. No body aches.did not test for covid.  Sx have been for the past 8 days. She does feel slightly better, but it still is lingering.  She does want to be tested for flu, covid and strep.  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   Stroke (HCC) 03/13/2015   Tension headache 11/18/2020   Word finding difficulty 02/27/2018    Past Surgical History:  Procedure Laterality Date   ABDOMINAL HYSTERECTOMY  10/2006   bowel reconstruction  10/2006   with hysterectomy   CESAREAN SECTION  2005   EP IMPLANTABLE DEVICE N/A 03/17/2015   Procedure: Loop Recorder Insertion;  Surgeon: Will Jorja Loa, MD;  Location: [facility]IVE [facility];  Service: Cardiovascular;  Laterality: N/A;   TEE WITHOUT CARDIOVERSION N/A 03/17/2015   Procedure: TRANSESOPHAGEAL ECHOCARDIOGRAM (TEE);  Surgeon: Chilton Si, MD;  Location: [facility] ENDOSCOPY;  Service: Cardiovascular;  Laterality: N/A;   TEE WITHOUT CARDIOVERSION N/A 01/06/2021   Procedure: TRANSESOPHAGEAL ECHOCARDIOGRAM (TEE);  Surgeon: [name], MD;  Location: [facility] ORS;  Service: Cardiovascular;  Laterality: N/A;   TOE SURGERY     Left 2nd metatarsal    Family History  Problem Relation Age of Onset   Hyperlipidemia Mother    Arthritis Father    Diabetes Father    Hypertension Father    Hyperlipidemia Father    Prostate cancer Father    Prostate cancer Maternal Uncle        x 3   Heart attack Maternal Grandfather    Hyperlipidemia Maternal  Grandfather    Prostate cancer Maternal Grandfather    Diabetes Paternal Grandmother    Stroke Paternal Grandmother    Diabetes Paternal Grandfather    Heart attack Paternal Grandfather    Colon cancer Neg Hx    Colon polyps Neg Hx    Stomach cancer Neg Hx    Esophageal cancer Neg Hx    Pancreatic cancer Neg Hx     Social History   Socioeconomic History   Marital status: Single    Spouse name: Not on file   Number of children: Not on file   Years of education: Not on file   Highest education level: Not on file  Occupational  History   Not on file  Tobacco Use   Smoking status: Never   Smokeless tobacco: Never  Vaping Use   Vaping Use: Never used  Substance and Sexual Activity   Alcohol use: Not Currently    Alcohol/week: 7.0 standard drinks    Types: 7 Glasses of wine per week    Comment: red wine occasionally but not since stroke   Drug use: Not Currently    Types: Marijuana    Comment: edibles-twice weekly   Sexual activity: Not Currently  Other Topics Concern   Not on file  Social History Narrative   Originally from [place]   Family lives up here in [place]   Has one daughter.   Enjoys spending time shopping and spending time with family    Social Determinants of Health   Financial Resource Strain: Not on file  Food Insecurity: Not on file  Transportation Needs: Not on file  Physical Activity: Not on file  Stress: Not on file  Social Connections: Not on file  Intimate Partner Violence: Not on file    Outpatient Medications Prior to Visit  Medication Sig Dispense Refill   acetaminophen (TYLENOL) 325 MG tablet Take 1-2 tablets (325-650 mg total) by mouth every 4 (four) hours as needed for mild pain.     albuterol (VENTOLIN HFA) 108 (90 Base) MCG/ACT inhaler Inhale 2 puffs into the lungs every 4 (four) hours as needed for wheezing or shortness of breath (cough, shortness of breath or wheezing.). 1 each 1   Ascorbic Acid (VITAMIN C) 1000 MG tablet Take 1 tablet (1,000 mg total) by mouth daily. 30 tablet 0   azelastine (ASTELIN) 0.1 % nasal spray Place 1 spray into both nostrils 2 (two) times daily. Use in each nostril as directed 30 mL 12   cholecalciferol (VITAMIN D3) 25 MCG (1000 UNIT) tablet Take 1 tablet (1,000 Units total) by mouth daily. 30 tablet 0   clopidogrel (PLAVIX) 75 MG tablet Take 1 tablet by mouth once daily 90 tablet 0   Continuous Blood Gluc Receiver (FREESTYLE LIBRE 2 READER) DEVI Use to check blood sugars. 1 each 0   Continuous Blood Gluc Sensor (FREESTYLE  LIBRE 2 SENSOR) MISC Apply every 14 days to check blood sugars. 6 each 1   ezetimibe (ZETIA) 10 MG tablet Take 1 tablet (10 mg total) by mouth daily. For cholesterol. 90 tablet 1   famotidine (PEPCID) 20 MG tablet Take 1 tablet (20 mg total) by mouth 2 (two) times daily. For heartburn. 60 tablet 0   metFORMIN (GLUCOPHAGE-XR) 500 MG 24 hr tablet TAKE 1 TABLET BY MOUTH ONCE DAILY WITH BREAKFAST FOR DIABETES 90 tablet 0   methocarbamol (ROBAXIN) 500 MG tablet Take 1 tablet (500 mg total) by mouth every 8 (eight) hours as needed for muscle spasms.  30 tablet 0   metoprolol succinate (TOPROL XL) 25 MG 24 hr tablet Take 1 tablet (25 mg total) by mouth daily. 30 tablet 1   Multiple Vitamins-Minerals (MULTIVITAMIN WITH MINERALS) tablet Take 1 tablet by mouth daily.     pantoprazole (PROTONIX) 40 MG tablet Take 1 tablet (40 mg total) by mouth daily. 30 tablet 3   rosuvastatin (CRESTOR) 40 MG tablet Take 1 tablet (40 mg total) by mouth daily. For cholesterol. 90 tablet 3   sertraline (ZOLOFT) 25 MG tablet Take 1 tablet (25 mg total) by mouth daily. For anxiety. 30 tablet 1   topiramate (TOPAMAX) 25 MG tablet Take 1 tablet (25 mg total) by mouth daily. For headache prevention. 90 tablet 3   No facility-administered medications prior to visit.    Allergies  Allergen Reactions   Cephalexin Hives and Rash    Can take Augmentin    Review of Systems  Constitutional:  Negative for chills and fever.  HENT:  Positive for congestion and sore throat. Negative for ear pain and sinus pain.   Respiratory:  Positive for cough and sputum production. Negative for shortness of breath and wheezing.   Cardiovascular:  Negative for chest pain.  All other systems reviewed and are negative.     Objective:    Physical Exam Constitutional:      General: She is not in acute distress.    Appearance: She is well-developed. She is not ill-appearing, toxic-appearing or diaphoretic.  Pulmonary:     Effort: Pulmonary  effort is normal.  Neurological:     General: No focal deficit present.     Mental Status: She is alert and oriented to person, place, and time.  Psychiatric:        Mood and Affect: Mood normal.        Behavior: Behavior normal.    Ht 5\' 2"  (1.575 m)    Wt 170 lb (77.1 kg)    BMI 31.09 kg/m  Wt Readings from Last 3 Encounters:  05/28/21 170 lb (77.1 kg)  03/26/21 171 lb 6.4 oz (77.7 kg)  03/11/21 173 lb (78.5 kg)       Assessment & Plan:   Problem List Items Addressed This Visit       Respiratory   Acute pharyngitis    Suspected covid, pending test. Take antibiotic as prescribed. Increase oral fluids. Pt to f/u if sx worsen and or fail to improve in 2-3 days.  Advised of CDC guidelines for self isolation/ ending isolation.  Advised of safe practice guidelines. Symptom Tier reviewed.  Encouraged to monitor for any worsening symptoms; watch for increased shortness of breath, weakness, and signs of dehydration. Advised when to seek emergency care.  Instructed to rest and hydrate well.  Advised to leave the house during recommended isolation period, only if it is necessary to seek medical care       Relevant Medications   amoxicillin-clavulanate (AUGMENTIN) 875-125 MG tablet     Other   Acute cough    Testing for flu and covid, pending results.      Relevant Orders   COVID-19, Flu A+B and RSV   Sore throat - Primary    Strep culture pending results.  Warm salt gargles.       Relevant Orders   POCT rapid strep A   Exposure to the flu   Relevant Orders   COVID-19, Flu A+B and RSV    I am having Amber Christopher "[name]" start on amoxicillin-clavulanate. I am  also having her maintain her multivitamin with minerals, azelastine, albuterol, vitamin C, cholecalciferol, rosuvastatin, acetaminophen, famotidine, pantoprazole, ezetimibe, clopidogrel, sertraline, metoprolol succinate, topiramate, FreeStyle Libre 2 Reader, [employer] 2 Sensor, metFORMIN, and  methocarbamol.  Meds ordered this encounter  Medications   amoxicillin-clavulanate (AUGMENTIN) 875-125 MG tablet    Sig: Take 1 tablet by mouth 2 (two) times daily for 10 days.    Dispense:  20 tablet    Refill:  0    Pt tolerates augmentin    Order Specific Question:   Supervising Provider    Answer:   BEDSOLE, AMY E [[id number]]    I discussed the assessment and treatment plan with the patient. The patient was provided an opportunity to ask questions and all were answered. The patient agreed with the plan and demonstrated an understanding of the instructions.   The patient was advised to call back or seek an in-person evaluation if the symptoms worsen or if the condition fails to improve as anticipated.  I provided 26 minutes of face-to-face time during this encounter.   Mort Sawyers, FNP [facility] [place] [phone] (phone) [phone] (fax)  [facility]

## 2021-05-28 NOTE — Assessment & Plan Note (Signed)
Suspected covid, pending test. Take antibiotic as prescribed. Increase oral fluids. Pt to f/u if sx worsen and or fail to improve in 2-3 days.  Advised of CDC guidelines for self isolation/ ending isolation.  Advised of safe practice guidelines. Symptom Tier reviewed.  Encouraged to monitor for any worsening symptoms; watch for increased shortness of breath, weakness, and signs of dehydration. Advised when to seek emergency care.  Instructed to rest and hydrate well.  Advised to leave the house during recommended isolation period, only if it is necessary to seek medical care

## 2021-05-28 NOTE — Assessment & Plan Note (Signed)
Strep culture pending results.  Warm salt gargles.

## 2021-05-29 LAB — COVID-19, FLU A+B AND RSV
Influenza A, NAA: NOT DETECTED
Influenza B, NAA: NOT DETECTED
RSV, NAA: NOT DETECTED
SARS-CoV-2, NAA: DETECTED — AB

## 2021-05-29 LAB — POCT RAPID STREP A (OFFICE): Rapid Strep A Screen: NEGATIVE

## 2021-05-29 MED ORDER — BENZONATATE 200 MG PO CAPS: 200.0000 mg | ORAL_CAPSULE | Freq: Three times a day (TID) | ORAL | 0 refills | Status: AC | PRN

## 2021-06-02 ENCOUNTER — Ambulatory Visit: Payer: BC Managed Care – PPO | Admitting: Adult Health

## 2021-06-02 NOTE — Progress Notes (Deleted)
[facility] [address] [place]. [place] [address] [phone]       OFFICE FOLLOW UP NOTE  [name] Date of Birth:  [birth year] Medical Record Number:  [id number]   Referring MD: Alma Friendly, NP  Reason for Referral: Stroke   No chief complaint on file.     HPI:   Update 06/02/2021 JM:           History provided for reference purposes only Consult visit 02/16/2021 Dr. [name]: Amber [name] is a 51 year old woman with a past medical history significant for multiple remote cerebellar strokes, hyperlipidemia, anxiety, depression, asthma, tension headache who presented to the emergency department with acute onset expressive aphasia last known well was 1115 on 01/02/2021.  Her daughter noticed that she was not making sense when she spoke therefore she called 911.  On Dr. Livia Snellen examination patient was able to follow commands however was unable to express any meaningful speech.  Her words were jumbled together in a word salad.  She did not have any other significant deficits.  NIH stroke scale was a 4 (2 for questions and 2 for aphasia). Head CT showed NAICP. Patient not on anticoagulation.  After discussion of risk benefits she was given IV tPA uneventfully and showed improvement.  She had some transient worsening of her symptoms and CT scan of the head was repeated which showed no hemorrhagic conversion.  MRI scan of the brain personally reviewed showed acute infarction involving left middle frontal gyrus and left parietal.  A1c 6.2.  I discussed with milligrams percent.  CT angiogram of brain and neck did not show significant large vessel stenosis or occlusion transthoracic echo showed normal ejection fraction without intracardiac clot or PFO.  TEE also did not show PFO, clot or vegetation or cardiac source of embolism.  ESR was 5 mm.  HIV was negative.  Homocystine was normal anticardiolipin antibodies were negative.  ANA was negative.  Urine drug screen was  negative.  Patient had previously had a stroke in October 2016 also of cryptogenic etiology involving posterior left frontal cortical and subcortical white matter as well as MRI at that time and also shown a previous small remote right parietal cortical infarct.  She had a loop recorder inserted which did not show any paroxysmal A. fib and its battery ran out and is currently nonfunctional.  Patient had been on aspirin and during the current admission it was switched to Plavix which is tolerating well without bruising or bleeding.  She states her speech is almost back to normal though when she is excited or exhausted she occasionally struggles for words.  She still has some residual left-sided weakness and stiffness from a previous stroke.  ROS:   14 system review of systems is positive for speech difficulty, word finding difficulty, aphasia, stiffness of the left leg, difficulty walking all other systems negative  PMH:  Past Medical History:  Diagnosis Date   Anxiety    self reported   Asthma    Depression    controlled   Diabetes mellitus without complication ([place])    Hyperlipidemia    controlled with medication   IBS (irritable bowel syndrome)    Slurred speech    Stroke (cerebrum) ([place]) 01/07/2021   Stroke ([place]) 03/13/2015   Tension headache 11/18/2020   Word finding difficulty 02/27/2018    Social History:  Social History   Socioeconomic History   Marital status: Single    Spouse name: Not on file   Number of  children: Not on file   Years of education: Not on file   Highest education level: Not on file  Occupational History   Not on file  Tobacco Use   Smoking status: Never   Smokeless tobacco: Never  Vaping Use   Vaping Use: Never used  Substance and Sexual Activity   Alcohol use: Not Currently    Alcohol/week: 7.0 standard drinks    Types: 7 Glasses of wine per week    Comment: red wine occasionally but not since stroke   Drug use: Not Currently    Types: Marijuana     Comment: edibles-twice weekly   Sexual activity: Not Currently  Other Topics Concern   Not on file  Social History Narrative   Originally from [place] lives up here in [place]   Has one daughter.   Enjoys spending time shopping and spending time with family    Social Determinants of Health   Financial Resource Strain: Not on file  Food Insecurity: Not on file  Transportation Needs: Not on file  Physical Activity: Not on file  Stress: Not on file  Social Connections: Not on file  Intimate Partner Violence: Not on file    Medications:   Current Outpatient Medications on File Prior to Visit  Medication Sig Dispense Refill   acetaminophen (TYLENOL) 325 MG tablet Take 1-2 tablets (325-650 mg total) by mouth every 4 (four) hours as needed for mild pain.     albuterol (VENTOLIN HFA) 108 (90 Base) MCG/ACT inhaler Inhale 2 puffs into the lungs every 4 (four) hours as needed for wheezing or shortness of breath (cough, shortness of breath or wheezing.). 1 each 1   amoxicillin-clavulanate (AUGMENTIN) 875-125 MG tablet Take 1 tablet by mouth 2 (two) times daily for 10 days. 20 tablet 0   Ascorbic Acid (VITAMIN C) 1000 MG tablet Take 1 tablet (1,000 mg total) by mouth daily. 30 tablet 0   azelastine (ASTELIN) 0.1 % nasal spray Place 1 spray into both nostrils 2 (two) times daily. Use in each nostril as directed 30 mL 12   benzonatate (TESSALON) 200 MG capsule Take 1 capsule (200 mg total) by mouth 3 (three) times daily as needed for up to 10 days for cough. 20 capsule 0   cholecalciferol (VITAMIN D3) 25 MCG (1000 UNIT) tablet Take 1 tablet (1,000 Units total) by mouth daily. 30 tablet 0   clopidogrel (PLAVIX) 75 MG tablet Take 1 tablet by mouth once daily 90 tablet 0   Continuous Blood Gluc Receiver (FREESTYLE LIBRE 2 READER) DEVI Use to check blood sugars. 1 each 0   Continuous Blood Gluc Sensor (FREESTYLE LIBRE 2 SENSOR) MISC Apply every 14 days to check blood sugars.  6 each 1   ezetimibe (ZETIA) 10 MG tablet Take 1 tablet (10 mg total) by mouth daily. For cholesterol. 90 tablet 1   famotidine (PEPCID) 20 MG tablet Take 1 tablet (20 mg total) by mouth 2 (two) times daily. For heartburn. 60 tablet 0   metFORMIN (GLUCOPHAGE-XR) 500 MG 24 hr tablet TAKE 1 TABLET BY MOUTH ONCE DAILY WITH BREAKFAST FOR DIABETES 90 tablet 0   methocarbamol (ROBAXIN) 500 MG tablet Take 1 tablet (500 mg total) by mouth every 8 (eight) hours as needed for muscle spasms. 30 tablet 0   metoprolol succinate (TOPROL XL) 25 MG 24 hr tablet Take 1 tablet (25 mg total) by mouth daily. 30 tablet 1   Multiple Vitamins-Minerals (MULTIVITAMIN WITH MINERALS) tablet Take 1  tablet by mouth daily.     pantoprazole (PROTONIX) 40 MG tablet Take 1 tablet (40 mg total) by mouth daily. 30 tablet 3   rosuvastatin (CRESTOR) 40 MG tablet Take 1 tablet (40 mg total) by mouth daily. For cholesterol. 90 tablet 3   sertraline (ZOLOFT) 25 MG tablet Take 1 tablet (25 mg total) by mouth daily. For anxiety. 30 tablet 1   topiramate (TOPAMAX) 25 MG tablet Take 1 tablet (25 mg total) by mouth daily. For headache prevention. 90 tablet 3   No current facility-administered medications on file prior to visit.    Allergies:   Allergies  Allergen Reactions   Cephalexin Hives and Rash    Can take Augmentin    Physical Exam There were no vitals filed for this visit. There is no height or weight on file to calculate BMI.   General: well developed, well nourished middle-aged African-American lady, seated, in no evident distress Head: head normocephalic and atraumatic.   Neck: supple with no carotid or supraclavicular bruits Cardiovascular: regular rate and rhythm, no murmurs Musculoskeletal: no deformity Skin:  no rash/petichiae Vascular:  Normal pulses all extremities  Neurologic Exam Mental Status: Awake and fully alert. Oriented to place and time. Recent and remote memory intact. Attention span, concentration  and fund of knowledge appropriate. Mood and affect appropriate.  Speech nonfluent with occasional word finding difficulties and hesitancy.  No dysarthria.  Good comprehension, naming and repetition. Cranial Nerves: Pupils equal, briskly reactive to light. Extraocular movements full without nystagmus. Visual fields full to confrontation. Hearing intact. Facial sensation intact. Face, tongue, palate moves normally and symmetrically.  Motor: Normal bulk and tone. Normal strength in all tested extremity muscles except diminished fine finger movements on the left.  Mild left grip weakness.  Orbits right over left upper extremity.  Tone is increased in the left leg.. Sensory.: intact to touch , pinprick , position and vibratory sensation.  Coordination: Rapid alternating movements normal in all extremities. Finger-to-nose and heel-to-shin performed accurately bilaterally. Gait and Station: Arises from chair without difficulty. Stance is normal. Gait demonstrates normal stride length and balance .  Drags left leg slightly while walking.  Able to heel, toe and tandem walk with  difficulty.  Reflexes: 1+ and symmetric. Toes downgoing.      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  Remote history of cryptogenic stroke in October 2016.  Vascular risk factors of diabetes, hypertension, hyperlipidemia.  She has previously had prolonged cardiac monitoring with loop recorder which did not show paroxysmal A. fib till the end of its battery life .    -Continue Plavix 75 mg daily, Crestor 40 mg daily and Zetia 10 mg daily for secondary stroke prevention - discussed close f/u with PCP to maintain strict control of hypertension with blood pressure goal below 130/90, diabetes with hemoglobin A1c goal below 6.5% and lipids with LDL cholesterol goal below 70 mg/dL.     CC:  [facility] provider: Pleas Koch, NP   I spent *** minutes of face-to-face and non-face-to-face time with patient.  This included previsit chart review, lab review, study review, order entry, electronic health record documentation, patient education  Frann Rider, [facility]  [facility] [address] [place], [place] [address]  Phone [phone] Fax [phone] Note: This document was prepared with digital dictation and possible smart phrase technology. Any transcriptional errors that result from this process are unintentional.

## 2021-06-05 ENCOUNTER — Ambulatory Visit: Payer: BC Managed Care – PPO | Admitting: Internal Medicine

## 2021-06-05 NOTE — Telephone Encounter (Signed)
Placed in mail

## 2021-06-10 ENCOUNTER — Encounter: Payer: Self-pay | Admitting: Adult Health

## 2021-06-10 ENCOUNTER — Ambulatory Visit (INDEPENDENT_AMBULATORY_CARE_PROVIDER_SITE_OTHER): Payer: BC Managed Care – PPO | Admitting: Adult Health

## 2021-06-10 VITALS — BP 110/72 | HR 60 | Ht 62.0 in | Wt 173.0 lb

## 2021-06-10 DIAGNOSIS — I639 Cerebral infarction, unspecified: Secondary | ICD-10-CM | POA: Diagnosis not present

## 2021-06-10 NOTE — Progress Notes (Signed)
[facility] [address] [place]. [place] [address] [phone]       OFFICE FOLLOW UP NOTE  Amber. Amber [name] Date of Birth:  49-May-1972 Medical Record Number:  [id number]   Referring MD: Amber Friendly, NP  Reason for Referral: Stroke  Chief Complaint  Patient presents with   Follow-up    Rm 2 with daughter jordyn Pt is well and stable, has been improving.  No new concerns       HPI:   Update 06/10/2021, JM: Amber [name] is here today for a stroke follow-up accompanied by her daughter. Reports improving aphasia, left-sided weakness, and auditory comprehension deficits; denies new stroke/TIA symptoms. 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 She resumed driving around Thanksgiving and has been driving up to [employer] but only locally. Remains on plavix, zetia, and crestor without side effects. BP today 110/72. Labs A1c 6.2 (01/02/21), LDL 76 (03/03/21). She will see her PCP Tuesday and her Cardiologist 1/23. Eval by [place] neurology for second opinion on recurrent strokes - no further work up recommended as all since completed which reassured her.  No further concerns at this time   History provided for reference purposes only Initial visit 02/16/2021, Dr [name]: [name] is a 51 year old woman with a past medical history significant for multiple remote cerebellar strokes, hyperlipidemia, anxiety, depression, asthma, tension headache who presented to the emergency department with acute onset expressive aphasia last known well was 1115 on 01/02/2021.  Her daughter noticed that she was not making sense when she spoke therefore she called 911.  On Dr. Livia Snellen examination patient was able to follow commands however was unable to express any meaningful speech.  Her words were jumbled together in a word salad.  She did not have any other significant deficits.  NIH stroke  scale was a 4 (2 for questions and 2 for aphasia). Head CT showed NAICP. Patient not on anticoagulation.  After discussion of risk benefits she was given IV tPA uneventfully and showed improvement.  She had some transient worsening of her symptoms and CT scan of the head was repeated which showed no hemorrhagic conversion.  MRI scan of the brain personally reviewed showed acute infarction involving left middle frontal gyrus and left parietal.  A1c 6.2.  I discussed with milligrams percent.  CT angiogram of brain and neck did not show significant large vessel stenosis or occlusion transthoracic echo showed normal ejection fraction without intracardiac clot or PFO.  TEE also did not show PFO, clot or vegetation or cardiac source of embolism.  ESR was 5 mm.  HIV was negative.  Homocystine was normal anticardiolipin antibodies were negative.  ANA was negative.  Urine drug screen was negative.  Patient had previously had a stroke in October 2016 also of cryptogenic etiology involving posterior left frontal cortical and subcortical white matter as well as MRI at that time and also shown a previous small remote right parietal cortical infarct.  She had a loop recorder inserted which did not show any paroxysmal A. fib and its battery ran out and is currently nonfunctional.  Patient had been on aspirin and during the current admission it was switched to Plavix which is tolerating well without bruising or bleeding.  She states her speech is almost back to normal though when she is excited or exhausted she occasionally struggles for words.  She still has some residual left-sided weakness and stiffness  from a previous stroke.  ROS:   14 system review of systems is positive for those listed in HPI and all other systems negative  PMH:  Past Medical History:  Diagnosis Date   Anxiety    self reported   Asthma    Depression    controlled   Diabetes mellitus without complication ([place])    Hyperlipidemia    controlled  with medication   IBS (irritable bowel syndrome)    Slurred speech    Stroke (cerebrum) ([place]) 01/07/2021   Stroke ([place]) 03/13/2015   Tension headache 11/18/2020   Word finding difficulty 02/27/2018    Social History:  Social History   Socioeconomic History   Marital status: Single    Spouse name: Not on file   Number of children: Not on file   Years of education: Not on file   Highest education level: Not on file  Occupational History   Not on file  Tobacco Use   Smoking status: Never   Smokeless tobacco: Never  Vaping Use   Vaping Use: Never used  Substance and Sexual Activity   Alcohol use: Not Currently    Alcohol/week: 7.0 standard drinks    Types: 7 Glasses of wine per week    Comment: red wine occasionally but not since stroke   Drug use: Not Currently    Types: Marijuana    Comment: edibles-twice weekly   Sexual activity: Not Currently  Other Topics Concern   Not on file  Social History Narrative   Originally from [place] lives up here in [place]   Has one daughter.   Enjoys spending time shopping and spending time with family    Social Determinants of Health   Financial Resource Strain: Not on file  Food Insecurity: Not on file  Transportation Needs: Not on file  Physical Activity: Not on file  Stress: Not on file  Social Connections: Not on file  Intimate Partner Violence: Not on file    Medications:   Current Outpatient Medications on File Prior to Visit  Medication Sig Dispense Refill   acetaminophen (TYLENOL) 325 MG tablet Take 1-2 tablets (325-650 mg total) by mouth every 4 (four) hours as needed for mild pain.     albuterol (VENTOLIN HFA) 108 (90 Base) MCG/ACT inhaler Inhale 2 puffs into the lungs every 4 (four) hours as needed for wheezing or shortness of breath (cough, shortness of breath or wheezing.). 1 each 1   Ascorbic Acid (VITAMIN C) 1000 MG tablet Take 1 tablet (1,000 mg total) by mouth daily. 30 tablet 0    azelastine (ASTELIN) 0.1 % nasal spray Place 1 spray into both nostrils 2 (two) times daily. Use in each nostril as directed 30 mL 12   cholecalciferol (VITAMIN D3) 25 MCG (1000 UNIT) tablet Take 1 tablet (1,000 Units total) by mouth daily. 30 tablet 0   clopidogrel (PLAVIX) 75 MG tablet Take 1 tablet by mouth once daily 90 tablet 0   Continuous Blood Gluc Receiver (FREESTYLE LIBRE 2 READER) DEVI Use to check blood sugars. 1 each 0   Continuous Blood Gluc Sensor (FREESTYLE LIBRE 2 SENSOR) MISC Apply every 14 days to check blood sugars. 6 each 1   ezetimibe (ZETIA) 10 MG tablet Take 1 tablet (10 mg total) by mouth daily. For cholesterol. 90 tablet 1   famotidine (PEPCID) 20 MG tablet Take 1 tablet (20 mg total) by mouth 2 (two) times daily. For heartburn. 60 tablet 0   metFORMIN (  GLUCOPHAGE-XR) 500 MG 24 hr tablet TAKE 1 TABLET BY MOUTH ONCE DAILY WITH BREAKFAST FOR DIABETES 90 tablet 0   methocarbamol (ROBAXIN) 500 MG tablet Take 1 tablet (500 mg total) by mouth every 8 (eight) hours as needed for muscle spasms. 30 tablet 0   metoprolol succinate (TOPROL XL) 25 MG 24 hr tablet Take 1 tablet (25 mg total) by mouth daily. 30 tablet 1   Multiple Vitamins-Minerals (MULTIVITAMIN WITH MINERALS) tablet Take 1 tablet by mouth daily.     pantoprazole (PROTONIX) 40 MG tablet Take 1 tablet (40 mg total) by mouth daily. 30 tablet 3   rosuvastatin (CRESTOR) 40 MG tablet Take 1 tablet (40 mg total) by mouth daily. For cholesterol. 90 tablet 3   sertraline (ZOLOFT) 25 MG tablet Take 1 tablet (25 mg total) by mouth daily. For anxiety. 30 tablet 1   topiramate (TOPAMAX) 25 MG tablet Take 1 tablet (25 mg total) by mouth daily. For headache prevention. 90 tablet 3   No current facility-administered medications on file prior to visit.    Allergies:   Allergies  Allergen Reactions   Cephalexin Hives and Rash    Can take Augmentin    Physical Exam Today's Vitals   06/10/21 1410  BP: 110/72  Pulse: 60   Weight: 173 lb (78.5 kg)  Height: '5\' 2"'  (1.575 m)   Body mass index is 31.64 kg/m.  General: well developed, well nourished middle-aged African-American lady, seated, in no evident distress Head: head normocephalic and atraumatic.   Neck: supple with no carotid or supraclavicular bruits Cardiovascular: regular rate and rhythm, no murmurs Musculoskeletal: no deformity Skin:  no rash/petichiae Vascular:  Normal pulses all extremities  Neurologic Exam Mental Status: Awake and fully alert. Oriented to place and time. Recent and remote memory intact. Attention span, concentration and fund of knowledge appropriate. Mood and affect appropriate.  Speech nonfluent with occasional word finding difficulties and hesitancy.  No dysarthria.  Good comprehension, naming and repetition. Cranial Nerves: Pupils equal, briskly reactive to light. Extraocular movements full without nystagmus. Visual fields full to confrontation. Hearing intact. Facial sensation intact. Face, tongue, palate moves normally and symmetrically.  Motor: Normal bulk and tone. Normal strength in all tested extremity muscles except diminished fine finger movements on the left and mild left grip weakness.  Orbits right over left upper extremity.  Tone is increased in the left leg.. Sensory.: intact to touch , pinprick , position and vibratory sensation.  Coordination: Rapid alternating movements normal in all extremities except left hand. Finger-to-nose and heel-to-shin performed accurately right side with some incoordination of left side. Gait and Station: Arises from chair without difficulty. Stance is normal. Gait demonstrates normal stride length and balance .  Drags left leg slightly while walking.  Difficulty performing heel, toe and tandem walk Reflexes: 1+ and symmetric. Toes downgoing.        ASSESSMENT: 51 year old African-American lady with recurrent cryptogenic left MCA branch infarct in August 2022 with residual mild  expressive aphasia.  Remote history of L MCA cryptogenic stroke in October 2016.  Vascular risk factors of diabetes, hypertension, hyperlipidemia.       Recurrent left MCA branch infarct, cryptogenic Expressive aphasia, cognitive impairment with auditory comprehension difficulty and left side weakness gradually improving. She questions possible return back to work next month - recommend gradual slow return back to work possibly 2-3 days/week and only half day and slowly increase as tolerated. Has f/u with PMR in 2 weeks with plans on discussing further Continue  Plavix 66m and crestor and zetia for secondary stroke prevention measures. Close follow up by PCP for secondary risk factors Sleep study negative for sleep apnea 08/2017 Hypercoagulable panel negative ILR 03/2015-07/2018 end of life - no evidence of afib  TEE negative DM Goal A1c <7%, 6.2 in 12/2020,  Stable on Metformin per PCP HTN Goal BP <130/90 Stable on Metoprolol per PCP HLD Goal LDL <70, 76 in 02/2021 Continue Zetia 140mand Crestor 4035m   CC:  ClaPleas KochP   I spent 38 minutes of face-to-face and non-face-to-face time with patient and daughter.  This included previsit chart review, lab review, study review, electronic health record documentation, patient education and discussion regarding recurrent left MCA strokes and residual deficits, potential return back to work, secondary stroke prevention measures and aggressive stroke risk factor management and answered all other questions to patient satisfaction  JesFrann RiderGNOceans [facility] Of [facility] CharlesuiMon[facility] [address] [id number] [phone] [phone]: This document was prepared with digital dictation and possible smart phrase technology. Any transcriptional errors that result from this process are unintentional.

## 2021-06-10 NOTE — Patient Instructions (Addendum)
Continue clopidogrel 75 mg daily  and Crestor and zetia  for secondary stroke prevention  Continue to follow up with PCP regarding cholesterol, blood pressure and diabetes management  Maintain strict control of hypertension with blood pressure goal below 130/90, diabetes with hemoglobin A1c goal below 7.0 % and cholesterol with LDL cholesterol (bad cholesterol) goal below 70 mg/dL.   Signs of a Stroke? Follow the BEFAST method:  Balance Watch for a sudden loss of balance, trouble with coordination or vertigo Eyes Is there a sudden loss of vision in one or both eyes? Or double vision?  Face: Ask the person to smile. Does one side of the face droop or is it numb?  Arms: Ask the person to raise both arms. Does one arm drift downward? Is there weakness or numbness of a leg? Speech: Ask the person to repeat a simple phrase. Does the speech sound slurred/strange? Is the person confused ? Time: If you observe any of these signs, call 911.    Followup in the future with me in 6 months or call earlier if needed      Thank you for coming to see [place] at [facility]. I hope we have been able to provide you high quality care today.  You may receive a patient satisfaction survey over the next few weeks. We would appreciate your feedback and comments so that we may continue to improve ourselves and the health of our patients.

## 2021-06-16 ENCOUNTER — Other Ambulatory Visit: Payer: Self-pay

## 2021-06-16 ENCOUNTER — Encounter: Payer: Self-pay | Admitting: Primary Care

## 2021-06-16 ENCOUNTER — Ambulatory Visit (INDEPENDENT_AMBULATORY_CARE_PROVIDER_SITE_OTHER): Payer: BC Managed Care – PPO | Admitting: Primary Care

## 2021-06-16 VITALS — BP 110/64 | HR 87 | Temp 97.6°F | Ht 62.0 in | Wt 172.0 lb

## 2021-06-16 DIAGNOSIS — E1169 Type 2 diabetes mellitus with other specified complication: Secondary | ICD-10-CM

## 2021-06-16 DIAGNOSIS — E119 Type 2 diabetes mellitus without complications: Secondary | ICD-10-CM

## 2021-06-16 DIAGNOSIS — M62838 Other muscle spasm: Secondary | ICD-10-CM

## 2021-06-16 DIAGNOSIS — F411 Generalized anxiety disorder: Secondary | ICD-10-CM | POA: Diagnosis not present

## 2021-06-16 DIAGNOSIS — Z8673 Personal history of transient ischemic attack (TIA), and cerebral infarction without residual deficits: Secondary | ICD-10-CM | POA: Diagnosis not present

## 2021-06-16 DIAGNOSIS — E785 Hyperlipidemia, unspecified: Secondary | ICD-10-CM | POA: Diagnosis not present

## 2021-06-16 LAB — LIPID PANEL
Cholesterol: 140 mg/dL (ref 0–200)
HDL: 68.6 mg/dL (ref >39.00)
LDL Cholesterol: 57 mg/dL (ref 0–99)
NonHDL: 71.13
Total CHOL/HDL Ratio: 2
Triglycerides: 70 mg/dL (ref 0.0–149.0)
VLDL: 14 mg/dL (ref 0.0–40.0)

## 2021-06-16 LAB — POCT GLYCOSYLATED HEMOGLOBIN (HGB A1C): Hemoglobin A1C: 5.9 % — AB (ref 4.0–5.6)

## 2021-06-16 MED ORDER — SERTRALINE HCL 25 MG PO TABS: 25.0000 mg | ORAL_TABLET | Freq: Every day | ORAL | 3 refills | Status: DC

## 2021-06-16 NOTE — Assessment & Plan Note (Signed)
Overall controlled on sertraline 25 mg, she would like to stay at this dose.  Refills sent to pharmacy.

## 2021-06-16 NOTE — Assessment & Plan Note (Signed)
Controlled in the office today with A1C of 5.9.  Continue metformin XR 500 mg daily. She will schedule eye exam. Managed on statin. Urine microalbumin UTD.  Follow up in 6 months.

## 2021-06-16 NOTE — Assessment & Plan Note (Addendum)
Continue rosuvastatin 40 mg and Zetia 10 mg. Repeat lipid panel pending.  LDL goal <70

## 2021-06-16 NOTE — Patient Instructions (Signed)
Stop by the lab prior to leaving today. I will notify you of your results once received.   Have HR send me paperwork for your part time work schedule. Send me the dates again via MyChart.  It was a pleasure to see you today!

## 2021-06-16 NOTE — Progress Notes (Signed)
Subjective:    Patient ID: Amber [name], female    DOB: Jan 14, 1971, 51 y.o.   MRN: [id number]  HPI  Amber [name] is a very pleasant 51 y.o. female with a history of type 2 diabetes, recurrent stroke, GERD, IBS, GAD, expressive aphasia from stroke who presents today for follow up of diabetes and CVA.  1) History of CVA: Recurrent stroke history, last stroke in August 2022. Evaluated by Neurology on 06/10/21 for follow up. At this visit she endorsed improving aphasia, left sided weakness, and auditory comprehension deficits. She denied any new stroke symptoms. She was evaluated by [facility]rology for second opinion, no further work up was recommended.   Today she is doing much better! Continues to improve with her expressive aphasia. Denies new stroke symptoms. She's been out of work since August 2022 and is ready to return to work part time with the following schedule:  Start date of February 7th, 9th, 14th, and 16th.  February 20th, 22nd, 24th, 27th. March 1st, 3rd.   March 6th through 24th, Monday, Tuesday, Thursday, Friday. No Wednesdays.  She would like a follow up in late March 2023 for permission to return full time.   She did apply for disability in November 2022, is waiting on a response.   BP Readings from Last 3 Encounters:  06/16/21 110/64  06/10/21 110/72  03/26/21 107/75     2) Type 2 Diabetes:  Current medications include: Metformin XR 500 mg daily  Last A1C: 6.2 in August 2022, 5.9 today Last Eye Exam: Due Last Foot Exam: Due Pneumonia Vaccination: 2016 Urine Microalbumin: UTD Statin: Crestor  Dietary changes since last visit: She is working on a [employer].    Exercise: None  3) GAD: Chronic, is more anxious since her stroke, has not been working since August 2022 and funds are running low. Currently managed on sertraline 25 mg, feels like this is helping. Is needing refills today.   Review of Systems  Eyes:  Negative for visual disturbance.   Respiratory:  Negative for shortness of breath.   Cardiovascular:  Negative for chest pain.  Neurological:  Negative for weakness and headaches.  Psychiatric/Behavioral:  The patient is nervous/anxious.         Past Medical History:  Diagnosis Date   Anxiety    self reported   Asthma    Depression    controlled   Diabetes mellitus without complication ([facility])    Hyperlipidemia    controlled with medication   IBS (irritable bowel syndrome)    Slurred speech    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   Stroke ([facility]) 03/13/2015   Tension headache 11/18/2020   Word finding difficulty 02/27/2018    Social History   Socioeconomic History   Marital status: Single    Spouse name: Not on file   Number of children: Not on file   Years of education: Not on file   Highest education level: Not on file  Occupational History   Not on file  Tobacco Use   Smoking status: Never   Smokeless tobacco: Never  Vaping Use   Vaping Use: Never used  Substance and Sexual Activity   Alcohol use: Not Currently    Alcohol/week: 7.0 standard drinks    Types: 7 Glasses of wine per week    Comment: red wine occasionally but not since stroke   Drug use: Not Currently    Types: Marijuana    Comment: edibles-twice weekly   Sexual activity: Not Currently  Other  Topics Concern   Not on file  Social History Narrative   Originally from [place]   Family lives up here in [place]   Has one daughter.   Enjoys spending time shopping and spending time with family    Social Determinants of Health   Financial Resource Strain: Not on file  Food Insecurity: Not on file  Transportation Needs: Not on file  Physical Activity: Not on file  Stress: Not on file  Social Connections: Not on file  Intimate Partner Violence: Not on file    Past Surgical History:  Procedure Laterality Date   ABDOMINAL HYSTERECTOMY  10/2006   bowel reconstruction  10/2006   with hysterectomy   CESAREAN SECTION  2005    EP IMPLANTABLE DEVICE N/A 03/17/2015   Procedure: Loop Recorder Insertion;  Surgeon: Will Jorja Loa, MD;  Location: [facility] INVASIVE CV LAB;  Service: Cardiovascular;  Laterality: N/A;   TEE WITHOUT CARDIOVERSION N/A 03/17/2015   Procedure: TRANSESOPHAGEAL ECHOCARDIOGRAM (TEE);  Surgeon: Chilton Si, MD;  Location: [facility] ENDOSCOPY;  Service: Cardiovascular;  Laterality: N/A;   TEE WITHOUT CARDIOVERSION N/A 01/06/2021   Procedure: TRANSESOPHAGEAL ECHOCARDIOGRAM (TEE);  Surgeon: [name], MD;  Location: [facility] ORS;  Service: Cardiovascular;  Laterality: N/A;   TOE SURGERY     Left 2nd metatarsal    Family History  Problem Relation Age of Onset   Hyperlipidemia Mother    Arthritis Father    Diabetes Father    Hypertension Father    Hyperlipidemia Father    Prostate cancer Father    Prostate cancer Maternal Uncle        x 3   Heart attack Maternal Grandfather    Hyperlipidemia Maternal Grandfather    Prostate cancer Maternal Grandfather    Diabetes Paternal Grandmother    Stroke Paternal Grandmother    Diabetes Paternal Grandfather    Heart attack Paternal Grandfather    Colon cancer Neg Hx    Colon polyps Neg Hx    Stomach cancer Neg Hx    Esophageal cancer Neg Hx    Pancreatic cancer Neg Hx     Allergies  Allergen Reactions   Cephalexin Hives and Rash    Can take Augmentin    Current Outpatient Medications on File Prior to Visit  Medication Sig Dispense Refill   acetaminophen (TYLENOL) 325 MG tablet Take 1-2 tablets (325-650 mg total) by mouth every 4 (four) hours as needed for mild pain.     albuterol (VENTOLIN HFA) 108 (90 Base) MCG/ACT inhaler Inhale 2 puffs into the lungs every 4 (four) hours as needed for wheezing or shortness of breath (cough, shortness of breath or wheezing.). 1 each 1   Ascorbic Acid (VITAMIN C) 1000 MG tablet Take 1 tablet (1,000 mg total) by mouth daily. 30 tablet 0   azelastine (ASTELIN) 0.1 % nasal spray Place 1 spray into both nostrils  2 (two) times daily. Use in each nostril as directed 30 mL 12   cholecalciferol (VITAMIN D3) 25 MCG (1000 UNIT) tablet Take 1 tablet (1,000 Units total) by mouth daily. 30 tablet 0   clopidogrel (PLAVIX) 75 MG tablet Take 1 tablet by mouth once daily 90 tablet 0   Continuous Blood Gluc Receiver (FREESTYLE LIBRE 2 READER) DEVI Use to check blood sugars. 1 each 0   Continuous Blood Gluc Sensor (FREESTYLE LIBRE 2 SENSOR) MISC Apply every 14 days to check blood sugars. 6 each 1   ezetimibe (ZETIA) 10 MG tablet Take 1 tablet (10 mg  total) by mouth daily. For cholesterol. 90 tablet 1   famotidine (PEPCID) 20 MG tablet Take 1 tablet (20 mg total) by mouth 2 (two) times daily. For heartburn. 60 tablet 0   metFORMIN (GLUCOPHAGE-XR) 500 MG 24 hr tablet TAKE 1 TABLET BY MOUTH ONCE DAILY WITH BREAKFAST FOR DIABETES 90 tablet 0   methocarbamol (ROBAXIN) 500 MG tablet Take 1 tablet (500 mg total) by mouth every 8 (eight) hours as needed for muscle spasms. 30 tablet 0   metoprolol succinate (TOPROL XL) 25 MG 24 hr tablet Take 1 tablet (25 mg total) by mouth daily. 30 tablet 1   Multiple Vitamins-Minerals (MULTIVITAMIN WITH MINERALS) tablet Take 1 tablet by mouth daily.     pantoprazole (PROTONIX) 40 MG tablet Take 1 tablet (40 mg total) by mouth daily. 30 tablet 3   rosuvastatin (CRESTOR) 40 MG tablet Take 1 tablet (40 mg total) by mouth daily. For cholesterol. 90 tablet 3   sertraline (ZOLOFT) 25 MG tablet Take 1 tablet (25 mg total) by mouth daily. For anxiety. 30 tablet 1   topiramate (TOPAMAX) 25 MG tablet Take 1 tablet (25 mg total) by mouth daily. For headache prevention. 90 tablet 3   No current facility-administered medications on file prior to visit.    BP 110/64    Pulse 87    Temp 97.6 F (36.4 C) (Temporal)    Ht 5\' 2"  (1.575 m)    Wt 172 lb (78 kg)    SpO2 100%    BMI 31.46 kg/m  Objective:   Physical Exam Cardiovascular:     Rate and Rhythm: Normal rate and regular rhythm.  Pulmonary:      Effort: Pulmonary effort is normal.     Breath sounds: Normal breath sounds.  Musculoskeletal:     Cervical back: Neck supple.  Skin:    General: Skin is warm and dry.  Psychiatric:        Mood and Affect: Mood normal.          Assessment & Plan:      This visit occurred during the SARS-CoV-2 public health emergency.  Safety protocols were in place, including screening questions prior to the visit, additional usage of staff PPE, and extensive cleaning of exam room while observing appropriate contact time as indicated for disinfecting solutions.

## 2021-06-16 NOTE — Assessment & Plan Note (Addendum)
Continues to improve, evidence was noted today! Reviewed neurology notes from chart in January 2023.   Continue BP, diabetes, cholesterol control.   Agree to a part time schedule for returning to work, start date of February 7th, see notes for specific dates.  Repeat lipid panel pending. Follow up in late March for determination of returning to work full time.

## 2021-06-17 DIAGNOSIS — I639 Cerebral infarction, unspecified: Secondary | ICD-10-CM | POA: Diagnosis not present

## 2021-06-18 ENCOUNTER — Encounter: Payer: Self-pay | Admitting: Physical Medicine & Rehabilitation

## 2021-06-18 ENCOUNTER — Other Ambulatory Visit: Payer: Self-pay

## 2021-06-18 ENCOUNTER — Encounter: Payer: BC Managed Care – PPO | Attending: Registered Nurse | Admitting: Physical Medicine & Rehabilitation

## 2021-06-18 VITALS — BP 109/76 | HR 66 | Temp 98.7°F | Ht 62.0 in | Wt 172.0 lb

## 2021-06-18 DIAGNOSIS — R4701 Aphasia: Secondary | ICD-10-CM | POA: Diagnosis not present

## 2021-06-18 MED ORDER — ROPINIROLE HCL 2 MG PO TABS: 2.0000 mg | ORAL_TABLET | Freq: Every day | ORAL | 1 refills | Status: DC
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
## 2021-06-18 NOTE — Progress Notes (Signed)
Cardiology Office Note    Date:  06/22/2021   ID:  Amber, [name] 18-Dec-1970, MRN [id number]  PCP:  Doreene Nest, NP  Cardiologist:  [name], MD  Electrophysiologist:  None   Chief Complaint: Follow up  History of Present Illness:   Amber [name] is a 51 y.o. female with history of recurrent strokes, DM2, HLD, Covid in 04/2021, asthma, IBS, and anxiety who presents for follow up of recurrent strokes.  She was admitted in 2016 with a left MCA infarct with an embolic pattern secondary to unknown source.  2D surface echo at that time demonstrated an EF of 50%, no regional wall motion abnormalities, grade 1 diastolic dysfunction, mild mitral regurgitation, normal-sized left atrium, normal RV systolic function and ventricular cavity size.  TEE revealed an EF of 45 to 50%, diffuse global hypokinesis, trivial mitral regurgitation, no PFO or ASD, no left atrial or left atrial appendage thrombus.  Transcranial Doppler was unrevealing.  She subsequently underwent ILR implantation.  Device interrogation has demonstrated no evidence of significant arrhythmia.  She was readmitted in 05/2016 with CVA with echo at that time demonstrating an EF of 55 to 60%, normal wall motion, normal LV diastolic function parameters, no PFO.  She was admitted in 12/2020 with recurrent stroke.  Repeat TEE showed no intracardiac thrombus or shunt.  Linear echodensity in the ascending aorta was noted and felt to most likely be artifact.  Subsequent CTA of the chest showed no significant abnormality.  She reestablished care with our office in 01/2021 for evaluation of chest pain.  She had recently visited the ED, and left without being seen due to prolonged wait and symptomatic improvement.  Subsequent coronary CTA on 02/12/2021 showed a calcium score of 0 with no evidence of CAD.  She was referred to EP for consideration of repeat ILR implantation.  She was last seen in the office in 02/2021 and was doing fairly  well from a cardiac perspective.  She did report occasional palpitations that were improved when compared to her prior visit.  Initially, she was scheduled to see EP for consideration of loop recorder, though this appointment was canceled as precertification was not obtained.  She was subsequently reluctant to proceed with loop recorder reimplantation.  It was also noted recent carotid Doppler showed only mild right internal carotid artery stenosis.  She wished to think about further outpatient cardiac monitoring.  She comes in doing well from a cardiac perspective and is without symptoms concerning for angina or decompensation.  She indicates she is approximately "60% better."  She would like to get back to work at a part-time basis and has discussed this with her neurologist and PCP.  She indicates they are both on board with her resuming work on a part-time basis.  No symptoms of palpitations, dizziness, presyncope, or syncope.  No lower extremity swelling.  She would like to continue to defer reimplantation of ILR.  She is interested in revisiting outpatient external cardiac monitoring.  No falls, hematochezia, or melena.  Blood pressure remains well controlled.  Otherwise, she does not have any concerns at this time.   Labs independently reviewed: 05/2021 - TC 140, TG 70, HDL 68, LDL 57, A1c 5.9, potassium 3.7, BUN 13, SCr 1.09, HGB 14.7, PLT 249 12/2020 - albumin 3.5, AST/ALT normal, magnesium 2.1 08/2020 - TSH normal  Past Medical History:  Diagnosis Date   Anxiety    self reported   Asthma    Depression    controlled  Diabetes mellitus without complication (HCC)    Hyperlipidemia    controlled with medication   IBS (irritable bowel syndrome)    Slurred speech    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   Stroke (HCC) 03/13/2015   Tension headache 11/18/2020   Word finding difficulty 02/27/2018    Past Surgical History:  Procedure Laterality Date   ABDOMINAL HYSTERECTOMY  10/2006   bowel  reconstruction  10/2006   with hysterectomy   CESAREAN SECTION  2005   EP IMPLANTABLE DEVICE N/A 03/17/2015   Procedure: Loop Recorder Insertion;  Surgeon: Will Jorja Loa, MD;  Location: [facility]ASIVE [facility];  Service: Cardiovascular;  Laterality: N/A;   TEE WITHOUT CARDIOVERSION N/A 03/17/2015   Procedure: TRANSESOPHAGEAL ECHOCARDIOGRAM (TEE);  Surgeon: Chilton Si, MD;  Location: [facility] ENDOSCOPY;  Service: Cardiovascular;  Laterality: N/A;   TEE WITHOUT CARDIOVERSION N/A 01/06/2021   Procedure: TRANSESOPHAGEAL ECHOCARDIOGRAM (TEE);  Surgeon: [name], MD;  Location: [facility] ORS;  Service: Cardiovascular;  Laterality: N/A;   TOE SURGERY     Left 2nd metatarsal    Current Medications: Current Meds  Medication Sig   acetaminophen (TYLENOL) 325 MG tablet Take 1-2 tablets (325-650 mg total) by mouth every 4 (four) hours as needed for mild pain.   albuterol (VENTOLIN HFA) 108 (90 Base) MCG/ACT inhaler Inhale 2 puffs into the lungs every 4 (four) hours as needed for wheezing or shortness of breath (cough, shortness of breath or wheezing.).   Ascorbic Acid (VITAMIN C) 1000 MG tablet Take 1 tablet (1,000 mg total) by mouth daily.   azelastine (ASTELIN) 0.1 % nasal spray Place 1 spray into both nostrils 2 (two) times daily. Use in each nostril as directed   cholecalciferol (VITAMIN D3) 25 MCG (1000 UNIT) tablet Take 1 tablet (1,000 Units total) by mouth daily.   clopidogrel (PLAVIX) 75 MG tablet Take 1 tablet by mouth once daily   Continuous Blood Gluc Receiver (FREESTYLE LIBRE 2 READER) DEVI Use to check blood sugars.   Continuous Blood Gluc Sensor (FREESTYLE LIBRE 2 SENSOR) MISC Apply every 14 days to check blood sugars.   ezetimibe (ZETIA) 10 MG tablet Take 1 tablet (10 mg total) by mouth daily. For cholesterol.   famotidine (PEPCID) 20 MG tablet Take 1 tablet (20 mg total) by mouth 2 (two) times daily. For heartburn.   metFORMIN (GLUCOPHAGE-XR) 500 MG 24 hr tablet TAKE 1 TABLET BY MOUTH  ONCE DAILY WITH BREAKFAST FOR DIABETES   methocarbamol (ROBAXIN) 500 MG tablet Take 1 tablet (500 mg total) by mouth every 8 (eight) hours as needed for muscle spasms.   metoprolol succinate (TOPROL XL) 25 MG 24 hr tablet Take 1 tablet (25 mg total) by mouth daily.   Multiple Vitamins-Minerals (MULTIVITAMIN WITH MINERALS) tablet Take 1 tablet by mouth daily.   pantoprazole (PROTONIX) 40 MG tablet Take 1 tablet (40 mg total) by mouth daily.   rOPINIRole (REQUIP) 2 MG tablet Take 1 tablet (2 mg total) by mouth at bedtime.   rosuvastatin (CRESTOR) 40 MG tablet Take 1 tablet (40 mg total) by mouth daily. For cholesterol.   sertraline (ZOLOFT) 25 MG tablet Take 1 tablet (25 mg total) by mouth daily. For anxiety.   topiramate (TOPAMAX) 25 MG tablet Take 1 tablet (25 mg total) by mouth daily. For headache prevention.    Allergies:   Cephalexin   Social History   Socioeconomic History   Marital status: Single    Spouse name: Not on file   Number of children: Not on file  Years of education: Not on file   Highest education level: Not on file  Occupational History   Not on file  Tobacco Use   Smoking status: Never   Smokeless tobacco: Never  Vaping Use   Vaping Use: Never used  Substance and Sexual Activity   Alcohol use: Not Currently    Alcohol/week: 7.0 standard drinks    Types: 7 Glasses of wine per week    Comment: red wine occasionally but not since stroke   Drug use: Not Currently    Types: Marijuana    Comment: edibles-twice weekly   Sexual activity: Not Currently  Other Topics Concern   Not on file  Social History Narrative   Originally from [place]   Family lives up here in [place]   Has one daughter.   Enjoys spending time shopping and spending time with family    Social Determinants of Health   Financial Resource Strain: Not on file  Food Insecurity: Not on file  Transportation Needs: Not on file  Physical Activity: Not on file  Stress: Not on file   Social Connections: Not on file     Family History:  The patient's family history includes Arthritis in her father; Diabetes in her father, paternal grandfather, and paternal grandmother; Heart attack in her maternal grandfather and paternal grandfather; Hyperlipidemia in her father, maternal grandfather, and mother; Hypertension in her father; Prostate cancer in her father, maternal grandfather, and maternal uncle; Stroke in her paternal grandmother. There is no history of Colon cancer, Colon polyps, Stomach cancer, Esophageal cancer, or Pancreatic cancer.  ROS:   12-point review of system is negative unless otherwise noted in the HPI.   EKGs/Labs/Other Studies Reviewed:    Studies reviewed were summarized above. The additional studies were reviewed today:  2D echo 03/14/2015: - Left ventricle: The cavity size was normal. Systolic function was    normal. The estimated ejection fraction was 50%. Wall motion was    normal; there were no regional wall motion abnormalities. Doppler    parameters are consistent with abnormal left ventricular    relaxation (grade 1 diastolic dysfunction). Doppler parameters    are consistent with elevated ventricular end-diastolic filling    pressure.  - Aortic valve: Structurally normal valve. There was no    regurgitation.  - Mitral valve: Structurally normal valve. There was mild    regurgitation.  - Left atrium: The atrium was normal in size.  - Right ventricle: The cavity size was normal. Wall thickness was    normal. Systolic function was normal.  - Right atrium: The atrium was normal in size.  - Tricuspid valve: There was mild regurgitation.  - Pulmonic valve: There was trivial regurgitation.  - Inferior vena cava: The vessel was normal in size. The    respirophasic diameter changes were in the normal range (>= 50%),    consistent with normal central venous pressure.  - Pericardium, extracardiac: The pericardium was normal in    appearance.    Impressions:   - LVEF is mildly impaired with diffuse hypokinesis.    Abnormal relaxation with mildly elevated filling pressures.    Mild MR and TR. __________  Transcranial doppler with bubbles 03/14/2015: Negative TCD Bubble study.No high intensity transient signals  (HITS) heard at rest or with valsalva. Therefore, there is no  apparent PFO. __________  TEE 03/17/2015: - Left ventricle: Systolic function was mildly reduced. The    estimated ejection fraction was in the range of 45% to 50%.  Diffuse hypokinesis.  - Left atrium: No evidence of thrombus in the atrial cavity or    appendage.  - Right atrium: No evidence of thrombus in the atrial cavity or    appendage.  - Atrial septum: No defect or patent foramen ovale was identified.    Echo contrast study showed no right-to-left atrial level shunt,    at baseline or with provocation. Negative saline microcavitation    study. ___________  2D echo 06/11/2016: - Left ventricle: The cavity size was normal. Wall thickness was    normal. Systolic function was normal. The estimated ejection    fraction was in the range of 55% to 60%. Wall motion was normal;    there were no regional wall motion abnormalities. Left    ventricular diastolic function parameters were normal.  - Atrial septum: No defect or patent foramen ovale was identified.   Impressions:   - No cardiac source of emboli was indentified. __________  2D echo 01/04/2021: 1. Left ventricular ejection fraction, by estimation, is 55 to 60%. The  left ventricle has normal function. The left ventricle has no regional  wall motion abnormalities. Left ventricular diastolic parameters are  consistent with Grade I diastolic  dysfunction (impaired relaxation).   2. Right ventricular systolic function is normal. The right ventricular  size is normal.   3. The mitral valve is degenerative. Mild mitral valve regurgitation.   4. The aortic valve is tricuspid. Aortic valve  regurgitation is not  visualized.   5. Agitated saline contrast bubble study was negative, with no evidence  of any interatrial shunt. __________  TEE 01/06/2021: 1. Left ventricular ejection fraction, by estimation, is 55 to 60%. The  left ventricle has normal function.   2. Right ventricular systolic function is normal. The right ventricular  size is normal.   3. No left atrial/left atrial appendage thrombus was detected. The LAA  emptying velocity was 76 cm/s.   4. The mitral valve is abnormal. Mild mitral valve regurgitation.   5. The aortic valve is tricuspid. Aortic valve regurgitation is not  visualized.   6. There is a linear echodensity in and adjacent to the ascending aorta  that most likely represents artifact. However, given cryptogenic strokes,  further evaluation with CTA could be helpful to exclude aortic pathology.   7. Agitated saline contrast bubble study was negative, with no evidence  of any interatrial shunt. __________  Coronary artery CTA 02/12/2021: FINDINGS: Aorta:  Normal size.  No calcifications.  No dissection.   Aortic Valve:  Trileaflet.  No calcifications.   Coronary Arteries:  Normal coronary origin.  Right dominance.   RCA is a dominant artery that gives rise to PDA and PLA. There is no plaque.   Left main is a large artery that gives rise to LAD and LCX arteries.   LAD has no plaque.   LCX is a non-dominant artery that gives rise to two obtuse marginal branches. There is no plaque.   Other findings:   Normal pulmonary vein drainage into the left atrium.   Normal left atrial appendage without a thrombus.   Normal size of the pulmonary artery.   IMPRESSION: 1. Normal coronary calcium score of 0. Patient is low risk for coronary events. 2. Normal coronary origin with right dominance. 3. No evidence of CAD. 4. CAD-RADS 0. Consider non-atherosclerotic causes of chest pain.    EKG:  EKG is ordered today.  The EKG ordered today  demonstrates NSR, 86 bpm, no acute ST-T changes  Recent Labs: 09/16/2020: TSH 1.45 01/04/2021: Magnesium 2.1 01/08/2021: ALT 35 02/03/2021: BUN 13; Creatinine, Ser 1.09; Hemoglobin 14.7; Platelets 249; Potassium 3.7; Sodium 138  Recent Lipid Panel    Component Value Date/Time   CHOL 140 06/16/2021 0810   CHOL 184 08/28/2019 1537   TRIG 70.0 06/16/2021 0810   HDL 68.60 06/16/2021 0810   HDL 75 08/28/2019 1537   CHOLHDL 2 06/16/2021 0810   VLDL 14.0 06/16/2021 0810   LDLCALC 57 06/16/2021 0810   LDLCALC 85 08/28/2019 1537    PHYSICAL EXAM:    VS:  BP 110/80 (BP Location: Left Arm, Patient Position: Sitting, Cuff Size: Normal)    Pulse 86    Ht 5\' 2"  (1.575 m)    Wt 176 lb (79.8 kg)    BMI 32.19 kg/m   BMI: Body mass index is 32.19 kg/m.  Physical Exam Constitutional:      Appearance: She is well-developed.  HENT:     Head: Normocephalic and atraumatic.  Eyes:     General:        Right eye: No discharge.        Left eye: No discharge.  Neck:     Vascular: No JVD.  Cardiovascular:     Rate and Rhythm: Normal rate and regular rhythm.     Pulses:          Posterior tibial pulses are 2+ on the right side and 2+ on the left side.     Heart sounds: Normal heart sounds, S1 normal and S2 normal. Heart sounds not distant. No midsystolic click and no opening snap. No murmur heard.   No friction rub.  Pulmonary:     Effort: Pulmonary effort is normal. No respiratory distress.     Breath sounds: Normal breath sounds. No decreased breath sounds, wheezing or rales.  Chest:     Chest wall: No tenderness.  Abdominal:     General: There is no distension.     Palpations: Abdomen is soft.     Tenderness: There is no abdominal tenderness.  Musculoskeletal:     Cervical back: Normal range of motion.     Right lower leg: No edema.     Left lower leg: No edema.  Skin:    General: Skin is warm and dry.     Nails: There is no clubbing.  Neurological:     Mental Status: She is alert and  oriented to person, place, and time.  Psychiatric:        Speech: Speech normal.        Behavior: Behavior normal.        Thought Content: Thought content normal.        Judgment: Judgment normal.    Wt Readings from Last 3 Encounters:  06/22/21 176 lb (79.8 kg)  06/18/21 172 lb (78 kg)  06/16/21 172 lb (78 kg)     ASSESSMENT & PLAN:   Recurrent CVAs: No new deficits.  Prior extensive cardiac work-up including TEE x2, transcranial Doppler, and ILR implantation have been unrevealing.  From a cardiac perspective, I feel it would be okay for her to begin working again at a part-time basis as she reacclimate herself to the workforce.  We have also agreed to pursue Zio patch monitoring x2 for further evaluation of her recurrent strokes.  She would like to defer reimplantation of ILR.  She remains on clopidogrel, rosuvastatin, and ezetimibe.  Follow-up with PCP and neurology as directed.  Chest pain and palpitations: No  further symptoms of chest pain.  Recent coronary CTA was normal without evidence of CAD or other significant findings.  She remains on low-dose metoprolol.  HLD associated with DM2: LDL 57 earlier this month.  She remains on rosuvastatin and ezetimibe.  Followed by PCP.   Disposition: F/u with Dr. Okey Dupre or an APP in 2 to 3 months.   Medication Adjustments/Labs and Tests Ordered: Current medicines are reviewed at length with the patient today.  Concerns regarding medicines are outlined above. Medication changes, Labs and Tests ordered today are summarized above and listed in the Patient Instructions accessible in Encounters.   Signed, Eula Listen, PA-C 06/22/2021 12:50 PM     [facility] [address] [place], [place] [address] [phone]

## 2021-06-22 ENCOUNTER — Ambulatory Visit (INDEPENDENT_AMBULATORY_CARE_PROVIDER_SITE_OTHER): Payer: BC Managed Care – PPO | Admitting: Physician Assistant

## 2021-06-22 ENCOUNTER — Encounter: Payer: Self-pay | Admitting: Physician Assistant

## 2021-06-22 ENCOUNTER — Other Ambulatory Visit: Payer: Self-pay

## 2021-06-22 ENCOUNTER — Ambulatory Visit (INDEPENDENT_AMBULATORY_CARE_PROVIDER_SITE_OTHER): Payer: BC Managed Care – PPO

## 2021-06-22 VITALS — BP 110/80 | HR 86 | Ht 62.0 in | Wt 176.0 lb

## 2021-06-22 DIAGNOSIS — E785 Hyperlipidemia, unspecified: Secondary | ICD-10-CM

## 2021-06-22 DIAGNOSIS — I639 Cerebral infarction, unspecified: Secondary | ICD-10-CM

## 2021-06-22 DIAGNOSIS — R072 Precordial pain: Secondary | ICD-10-CM | POA: Diagnosis not present

## 2021-06-22 DIAGNOSIS — R002 Palpitations: Secondary | ICD-10-CM

## 2021-06-22 DIAGNOSIS — E1169 Type 2 diabetes mellitus with other specified complication: Secondary | ICD-10-CM | POA: Diagnosis not present

## 2021-06-22 NOTE — Patient Instructions (Signed)
Medication Instructions:  No changes at this time.  *If you need a refill on your cardiac medications before your next appointment, please call your pharmacy*   Lab Work: None  If you have labs (blood work) drawn today and your tests are completely normal, you will receive your results only by: MyChart Message (if you have MyChart) OR A paper copy in the mail If you have any lab test that is abnormal or we need to change your treatment, we will call you to review the results.   Testing/Procedures: Your provider has ordered a 2 heart monitors to wear for 14 days. This will be mailed to your home with instructions on placement. Once you have finished the time frame requested, you will return monitor in box provided.      Follow-Up: At [facility], you and your health needs are our priority.  As part of our continuing mission to provide you with exceptional heart care, we have created designated Provider Care Teams.  These Care Teams include your primary Cardiologist (physician) and Advanced Practice Providers (APPs -  Physician Assistants and Nurse Practitioners) who all work together to provide you with the care you need, when you need it.   Your next appointment:   3 month(s)  The format for your next appointment:   In Person  Provider:   [name], MD or Eula Listen, PA-C

## 2021-06-25 ENCOUNTER — Ambulatory Visit: Payer: BC Managed Care – PPO | Admitting: Physical Medicine & Rehabilitation

## 2021-06-27 DIAGNOSIS — M791 Myalgia, unspecified site: Secondary | ICD-10-CM | POA: Diagnosis not present

## 2021-06-30 MED ORDER — METHOCARBAMOL 500 MG PO TABS: 500.0000 mg | ORAL_TABLET | Freq: Three times a day (TID) | ORAL | 0 refills | Status: DC | PRN

## 2021-06-30 NOTE — Telephone Encounter (Signed)
[facility] Night - Client Nonclinical Telephone Record  [facility] Client [facility] Night - Client Client Site [facility] - Night Provider Vernona Rieger - NP Contact Type Call Who Is Calling Patient / Member / Family / Caregiver Caller Name Jaylynne Birkhead Caller Phone Number [phone] Patient Name Amber [name] Patient DOB 06/06/1970 Call Type Message Only Information Provided Reason for Call Request to Schedule Office Appointment Initial Comment Caller received a message from her provider via patient portal to schedule an appt for tomorrow. The pt needs to do so. She is now aware of office hrs and is requesting a call back pls. Patient request to speak to RN No Additional Comment Caller is aware of office hrs and is requesting a call back pls. Disp. Time Disposition Final User 06/29/2021 5:10:53 PM General Information Provided Yes Reynaldo Minium Call Closed By: Reynaldo Minium Transaction Date/Time: 06/29/2021 5:06:19 PM (ET
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
## 2021-07-01 LAB — HM DIABETES EYE EXAM

## 2021-07-02 ENCOUNTER — Telehealth: Payer: Self-pay | Admitting: Primary Care

## 2021-07-02 NOTE — Telephone Encounter (Signed)
Pt dropped off paperwork  Type of forms received:fitness for duty  Routed to: joellen Paperwork received by : terrill   Individual made aware of 3-5 business day turn around (Y/N): y Form completed and patient made aware of charges(Y/N):y   Faxed to :   Form location:  kate box

## 2021-07-03 NOTE — Telephone Encounter (Signed)
See my chart message about this she is aware will not be addressed until your return back to the office.

## 2021-07-03 NOTE — Telephone Encounter (Signed)
Hello Jae Dire or Bary Castilla    I hope all is well.  I spoke with HR and the paperwork is required for me to return.  I will just return on Thursday instead of Tuesday to allow enough time for them to receive and review paperwork.    Thanks for your help,    [name]

## 2021-07-03 NOTE — Telephone Encounter (Signed)
Added to open message that has been sent to provider.  

## 2021-07-03 NOTE — Telephone Encounter (Signed)
Called patient let her know that I will have you address as soon as I can once you return. She will let her office know that we can not get filled out until you return. She will call the office and see if she can start before the paper work is received or if she needs to move the date to Wednesday. She will send me a message later today and let me know so that we have the correct date on paperwork.

## 2021-07-06 DIAGNOSIS — R002 Palpitations: Secondary | ICD-10-CM

## 2021-07-06 DIAGNOSIS — I639 Cerebral infarction, unspecified: Secondary | ICD-10-CM | POA: Diagnosis not present

## 2021-07-07 NOTE — Telephone Encounter (Signed)
Completed and handed to Joellen. 

## 2021-07-07 NOTE — Telephone Encounter (Signed)
Faxed for patient called and at her request had left copy at reception for pick up as well.

## 2021-07-07 NOTE — Telephone Encounter (Signed)
Letter printed.  Can you fax?

## 2021-07-07 NOTE — Telephone Encounter (Signed)
Called patient got number to have faxed. I have sent as requested

## 2021-07-07 NOTE — Telephone Encounter (Signed)
Noted. Paperwork completed and faxed. Patient aware.

## 2021-07-08 ENCOUNTER — Other Ambulatory Visit: Payer: Self-pay | Admitting: Primary Care

## 2021-07-08 DIAGNOSIS — E119 Type 2 diabetes mellitus without complications: Secondary | ICD-10-CM

## 2021-07-15 ENCOUNTER — Ambulatory Visit: Payer: BC Managed Care – PPO | Admitting: Internal Medicine

## 2021-07-18 DIAGNOSIS — I639 Cerebral infarction, unspecified: Secondary | ICD-10-CM | POA: Diagnosis not present

## 2021-07-22 ENCOUNTER — Ambulatory Visit: Payer: BC Managed Care – PPO | Admitting: Adult Health

## 2021-07-23 ENCOUNTER — Telehealth: Payer: Self-pay | Admitting: Physician Assistant

## 2021-07-23 NOTE — Telephone Encounter (Signed)
Patient was to wear 2 monitors back to back which were ordered on 06/22/21. Zio suite has "In Transit" listed for status. Will reach out to patient to confirm if she has mailed it back.

## 2021-07-23 NOTE — Telephone Encounter (Signed)
Amber [name] "[name]"     8:10 AM Hello, I hope all is well.  I have returned one of the monitors on Tuesday the 21st. I just working part-time on the 14th of this month. I plan to use the 2nd monitor on March 1st for 14days.   Thank you

## 2021-08-04 ENCOUNTER — Other Ambulatory Visit: Payer: Self-pay | Admitting: Primary Care

## 2021-08-04 DIAGNOSIS — Z0289 Encounter for other administrative examinations: Secondary | ICD-10-CM

## 2021-08-04 DIAGNOSIS — I10 Essential (primary) hypertension: Secondary | ICD-10-CM

## 2021-08-11 ENCOUNTER — Telehealth: Payer: Self-pay | Admitting: Primary Care

## 2021-08-11 ENCOUNTER — Encounter: Payer: Self-pay | Admitting: Physician Assistant

## 2021-08-11 ENCOUNTER — Telehealth: Payer: BC Managed Care – PPO | Admitting: Physician Assistant

## 2021-08-11 DIAGNOSIS — J0110 Acute frontal sinusitis, unspecified: Secondary | ICD-10-CM | POA: Diagnosis not present

## 2021-08-11 MED ORDER — COVID-19 AT HOME ANTIGEN TEST VI KIT: PACK | 0 refills | Status: DC

## 2021-08-11 MED ORDER — AMOXICILLIN-POT CLAVULANATE 875-125 MG PO TABS: 1.0000 | ORAL_TABLET | Freq: Two times a day (BID) | ORAL | 0 refills | Status: DC

## 2021-08-11 MED ORDER — AZELASTINE HCL 0.1 % NA SOLN: 1.0000 | Freq: Two times a day (BID) | NASAL | 0 refills | Status: AC

## 2021-08-11 NOTE — Progress Notes (Signed)
?Virtual Visit Consent  ? ?Amber [name], you are scheduled for a virtual visit with a [facility] provider today.   ?  ?Just as with appointments in the office, your consent must be obtained to participate.  Your consent will be active for this visit and any virtual visit you may have with one of our providers in the next 365 days.   ?  ?If you have a MyChart account, a copy of this consent can be sent to you electronically.  All virtual visits are billed to your insurance company just like a traditional visit in the office.   ? ?As this is a virtual visit, video technology does not allow for your provider to perform a traditional examination.  This may limit your provider's ability to fully assess your condition.  If your provider identifies any concerns that need to be evaluated in person or the need to arrange testing (such as labs, EKG, etc.), we will make arrangements to do so.   ?  ?Although advances in technology are sophisticated, we cannot ensure that it will always work on either your end or our end.  If the connection with a video visit is poor, the visit may have to be switched to a telephone visit.  With either a video or telephone visit, we are not always able to ensure that we have a secure connection.    ? ?I need to obtain your verbal consent now.   Are you willing to proceed with your visit today?  ?  ?Amber [name] has provided verbal consent on 08/11/2021 for a virtual visit (video or telephone). ?  ?Amber Rio, PA-C  ? ?Date: 08/11/2021 2:12 PM ? ? ?Virtual Visit via Video Note  ? ?ILeeanne [name], connected with  Amber [name]  ([id number], November 12, 1970) on 08/11/21 at  2:00 PM EDT by a video-enabled telemedicine application and verified that I am speaking with the correct person using two identifiers. ? ?Location: ?Patient: Virtual Visit Location Patient: Other: Work ?Provider: Virtual Visit Location Provider: Home Office ?  ?I discussed the limitations of evaluation and  management by telemedicine and the availability of in person appointments. The patient expressed understanding and agreed to proceed.   ? ?History of Present Illness: ?Amber [name] is a 51 y.o. who identifies as a female who was assigned female at birth, and is being seen today for scratchy, dry throat associated with cough and head congestion, sinus pressure and headache. Notes symptoms have continued to progress since starting and now with bilateral ear pain. Denies fever, chills, aches. Denies SOB. Has not tested for COVID. 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 ? ?HPI: HPI  ?Problems:  ?Patient Active Problem List  ? Diagnosis Date Noted  ? Acute pharyngitis 05/28/2021  ? Acute cough 05/28/2021  ? Sore throat 05/28/2021  ? Exposure to the flu 05/28/2021  ? Palpitations 03/11/2021  ? Muscle cramps   ? Diabetes mellitus type II, controlled ([place])   ? Cramp in limb   ? Controlled type 2 diabetes mellitus with complication, without long-term current use of insulin ([place])   ? Vascular headache   ? Recurrent strokes ([place])   ? Expressive aphasia   ? History of CVA (cerebrovascular accident) 01/02/2021  ? Dizziness 12/18/2020  ? Tension headache 11/18/2020  ? Acute neck pain 11/18/2020  ? 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 Splinter in skin, left foot 12/11/2019  ? Allergic rhinitis due to  pollen 10/15/2019  ? Concentration deficit 08/14/2019  ? Non-cardiac chest pain 05/18/2019  ? Right flank pain 11/22/2018  ? Left shoulder pain 08/02/2018  ? Restless leg syndrome 12/29/2016  ? GERD (gastroesophageal reflux disease) 08/24/2016  ? Hemorrhoids 08/24/2016  ? Healthcare maintenance 08/24/2016  ? GAD (generalized anxiety disorder) 06/22/2016  ? Levoscoliosis 10/06/2015  ? Type 2 diabetes mellitus ([place]) 09/08/2015  ? Fatigue 04/03/2015  ? History of stroke 03/14/2015  ? Precordial pain 09/29/2014  ? IBS (irritable bowel syndrome) 09/29/2014  ? Hyperlipidemia associated  with type 2 diabetes mellitus ([place]) 09/29/2014  ? Essential hypertension 09/29/2014  ?  ?Allergies:  ?Allergies  ?Allergen Reactions  ? Cephalexin Hives and Rash  ?  Can take Augmentin  ? ?Medications:  ?Current Outpatient Medications:  ?  amoxicillin-clavulanate (AUGMENTIN) 875-125 MG tablet, Take 1 tablet by mouth 2 (two) times daily., Disp: 14 tablet, Rfl: 0 ?  azelastine (ASTELIN) 0.1 % nasal spray, Place 1 spray into both nostrils 2 (two) times daily. Use in each nostril as directed, Disp: 30 mL, Rfl: 0 ?  COVID-19 At Home Antigen Test KIT, Use as directed., Disp: 1 kit, Rfl: 0 ?  acetaminophen (TYLENOL) 325 MG tablet, Take 1-2 tablets (325-650 mg total) by mouth every 4 (four) hours as needed for mild pain., Disp: , Rfl:  ?  albuterol (VENTOLIN HFA) 108 (90 Base) MCG/ACT inhaler, Inhale 2 puffs into the lungs every 4 (four) hours as needed for wheezing or shortness of breath (cough, shortness of breath or wheezing.)., Disp: 1 each, Rfl: 1 ?  Ascorbic Acid (VITAMIN C) 1000 MG tablet, Take 1 tablet (1,000 mg total) by mouth daily., Disp: 30 tablet, Rfl: 0 ?  cholecalciferol (VITAMIN D3) 25 MCG (1000 UNIT) tablet, Take 1 tablet (1,000 Units total) by mouth daily., Disp: 30 tablet, Rfl: 0 ?  clopidogrel (PLAVIX) 75 MG tablet, Take 1 tablet by mouth once daily, Disp: 90 tablet, Rfl: 0 ?  Continuous Blood Gluc Receiver (FREESTYLE LIBRE 2 READER) DEVI, Use to check blood sugars., Disp: 1 each, Rfl: 0 ?  Continuous Blood Gluc Sensor (FREESTYLE LIBRE 2 SENSOR) MISC, Apply every 14 days to check blood sugars., Disp: 6 each, Rfl: 1 ?  ezetimibe (ZETIA) 10 MG tablet, Take 1 tablet (10 mg total) by mouth daily. For cholesterol., Disp: 90 tablet, Rfl: 1 ?  famotidine (PEPCID) 20 MG tablet, Take 1 tablet (20 mg total) by mouth 2 (two) times daily. For heartburn., Disp: 60 tablet, Rfl: 0 ?  hydrochlorothiazide (HYDRODIURIL) 12.5 MG tablet, Take 1 tablet by mouth once daily for blood pressure, Disp: 90 tablet, Rfl: 3 ?   metFORMIN (GLUCOPHAGE-XR) 500 MG 24 hr tablet, TAKE 1 TABLET BY MOUTH ONCE DAILY WITH BREAKFAST FOR DIABETES, Disp: 90 tablet, Rfl: 1 ?  methocarbamol (ROBAXIN) 500 MG tablet, Take 1 tablet (500 mg total) by mouth every 8 (eight) hours as needed for muscle spasms., Disp: 30 tablet, Rfl: 0 ?  metoprolol succinate (TOPROL XL) 25 MG 24 hr tablet, Take 1 tablet (25 mg total) by mouth daily., Disp: 30 tablet, Rfl: 1 ?  Multiple Vitamins-Minerals (MULTIVITAMIN WITH MINERALS) tablet, Take 1 tablet by mouth daily., Disp: , Rfl:  ?  pantoprazole (PROTONIX) 40 MG tablet, Take 1 tablet (40 mg total) by mouth daily., Disp: 30 tablet, Rfl: 3 ?  rosuvastatin (CRESTOR) 40 MG tablet, Take 1 tablet (40 mg total) by mouth daily. For cholesterol., Disp: 90 tablet, Rfl: 3 ?  sertraline (ZOLOFT) 25 MG tablet, Take 1  tablet (25 mg total) by mouth daily. For anxiety., Disp: 90 tablet, Rfl: 3 ?  topiramate (TOPAMAX) 25 MG tablet, Take 1 tablet (25 mg total) by mouth daily. For headache prevention., Disp: 90 tablet, Rfl: 3 ? ?Observations/Objective: ?Patient is well-developed, well-nourished in no acute distress.  ?Resting comfortably at home.  ?Head is normocephalic, atraumatic.  ?No labored breathing. ?Speech is clear and coherent with logical content.  ?Patient is alert and oriented at baseline.  ? ?Assessment and Plan: ?1. Acute non-recurrent frontal sinusitis ?- COVID-19 At Home Antigen Test KIT; Use as directed.  Dispense: 1 kit; Refill: 0 ?- azelastine (ASTELIN) 0.1 % nasal spray; Place 1 spray into both nostrils 2 (two) times daily. Use in each nostril as directed  Dispense: 30 mL; Refill: 0 ?- amoxicillin-clavulanate (AUGMENTIN) 875-125 MG tablet; Take 1 tablet by mouth 2 (two) times daily.  Dispense: 14 tablet; Refill: 0 ? ?Viral versus bacterial. Will have her take COVID test to be cautious giving substantial medical history. Rx COVID testing kit sent to pharmacy. She is to let us know of results.Increase fluids. Rest. Start  Astelin and OTC antihistamine. Will place Augmentin on file at pharmacy to start pending negative COVID test.  ? ?Follow Up Instructions: ?I discussed the assessment and treatment plan with the patient. The patien

## 2021-08-11 NOTE — Patient Instructions (Signed)
?[name], thank you for joining Piedad Climes, PA-C for today's virtual visit.  While this provider is not your primary care provider (PCP), if your PCP is located in our provider database this encounter information will be shared with them immediately following your visit. ? ?Consent: ?(Patient) Amber [name] provided verbal consent for this virtual visit at the beginning of the encounter. ? ?Current Medications: ? ?Current Outpatient Medications:  ?  acetaminophen (TYLENOL) 325 MG tablet, Take 1-2 tablets (325-650 mg total) by mouth every 4 (four) hours as needed for mild pain., Disp: , Rfl:  ?  albuterol (VENTOLIN HFA) 108 (90 Base) MCG/ACT inhaler, Inhale 2 puffs into the lungs every 4 (four) hours as needed for wheezing or shortness of breath (cough, shortness of breath or wheezing.)., Disp: 1 each, Rfl: 1 ?  Ascorbic Acid (VITAMIN C) 1000 MG tablet, Take 1 tablet (1,000 mg total) by mouth daily., Disp: 30 tablet, Rfl: 0 ?  azelastine (ASTELIN) 0.1 % nasal spray, Place 1 spray into both nostrils 2 (two) times daily. Use in each nostril as directed, Disp: 30 mL, Rfl: 12 ?  cholecalciferol (VITAMIN D3) 25 MCG (1000 UNIT) tablet, Take 1 tablet (1,000 Units total) by mouth daily., Disp: 30 tablet, Rfl: 0 ?  clopidogrel (PLAVIX) 75 MG tablet, Take 1 tablet by mouth once daily, Disp: 90 tablet, Rfl: 0 ?  Continuous Blood Gluc Receiver (FREESTYLE LIBRE 2 READER) DEVI, Use to check blood sugars., Disp: 1 each, Rfl: 0 ?  Continuous Blood Gluc Sensor (FREESTYLE LIBRE 2 SENSOR) MISC, Apply every 14 days to check blood sugars., Disp: 6 each, Rfl: 1 ?  ezetimibe (ZETIA) 10 MG tablet, Take 1 tablet (10 mg total) by mouth daily. For cholesterol., Disp: 90 tablet, Rfl: 1 ?  famotidine (PEPCID) 20 MG tablet, Take 1 tablet (20 mg total) by mouth 2 (two) times daily. For heartburn., Disp: 60 tablet, Rfl: 0 ?  hydrochlorothiazide (HYDRODIURIL) 12.5 MG tablet, Take 1 tablet by mouth once daily for blood pressure,  Disp: 90 tablet, Rfl: 3 ?  metFORMIN (GLUCOPHAGE-XR) 500 MG 24 hr tablet, TAKE 1 TABLET BY MOUTH ONCE DAILY WITH BREAKFAST FOR DIABETES, Disp: 90 tablet, Rfl: 1 ?  methocarbamol (ROBAXIN) 500 MG tablet, Take 1 tablet (500 mg total) by mouth every 8 (eight) hours as needed for muscle spasms., Disp: 30 tablet, Rfl: 0 ?  metoprolol succinate (TOPROL XL) 25 MG 24 hr tablet, Take 1 tablet (25 mg total) by mouth daily., Disp: 30 tablet, Rfl: 1 ?  Multiple Vitamins-Minerals (MULTIVITAMIN WITH MINERALS) tablet, Take 1 tablet by mouth daily., Disp: , Rfl:  ?  pantoprazole (PROTONIX) 40 MG tablet, Take 1 tablet (40 mg total) by mouth daily., Disp: 30 tablet, Rfl: 3 ?  rOPINIRole (REQUIP) 2 MG tablet, Take 1 tablet (2 mg total) by mouth at bedtime., Disp: 30 tablet, Rfl: 1 ?  rosuvastatin (CRESTOR) 40 MG tablet, Take 1 tablet (40 mg total) by mouth daily. For cholesterol., Disp: 90 tablet, Rfl: 3 ?  sertraline (ZOLOFT) 25 MG tablet, Take 1 tablet (25 mg total) by mouth daily. For anxiety., Disp: 90 tablet, Rfl: 3 ?  topiramate (TOPAMAX) 25 MG tablet, Take 1 tablet (25 mg total) by mouth daily. For headache prevention., Disp: 90 tablet, Rfl: 3  ? ?Medications ordered in this encounter:  ?No orders of the defined types were placed in this encounter. ?  ? ?*If you need refills on other medications prior to your next appointment, please contact your pharmacy* ? ?  Follow-Up: ?Call back or seek an in-person evaluation if the symptoms worsen or if the condition fails to improve as anticipated. ? ?Other Instructions ?Please take the COVID test as directed and message me with the results. ? ?Keep well-hydrated and get plenty of rest. ?Start a saline nasal rinse. ?Start the Astelin spray I have sent in. ?Start an OTC antihistamine like Claritin or Xyzal. ?Tylenol if needed for headache. ?You can start plain Mucinex as well. ? ?If COVID test is negative and symptoms continue to progress, I will have you start the antibiotic I have placed  on file at the pharmacy.  ? ? ? ?If you have been instructed to have an in-person evaluation today at a local Urgent Care facility, please use the link below. It will take you to a list of all of our available [facility], including address, phone number and hours of operation. Please do not delay care.  ?[facility] ? ?If you or a family member do not have a primary care provider, use the link below to schedule a visit and establish care. When you choose a [facility] primary care physician or advanced practice provider, you gain a long-term partner in health. ?Find a Primary Care Provider ? ?Learn more about [facility]'s in-office and virtual care options: ?[facility] - Get Care Now  ?

## 2021-08-12 NOTE — Telephone Encounter (Signed)
error 

## 2021-08-15 DIAGNOSIS — I639 Cerebral infarction, unspecified: Secondary | ICD-10-CM | POA: Diagnosis not present

## 2021-08-25 ENCOUNTER — Ambulatory Visit (INDEPENDENT_AMBULATORY_CARE_PROVIDER_SITE_OTHER): Payer: BC Managed Care – PPO | Admitting: Primary Care

## 2021-08-25 ENCOUNTER — Encounter: Payer: Self-pay | Admitting: Primary Care

## 2021-08-25 ENCOUNTER — Other Ambulatory Visit: Payer: Self-pay

## 2021-08-25 VITALS — BP 126/82 | HR 84 | Temp 98.6°F | Ht 62.0 in | Wt 169.0 lb

## 2021-08-25 DIAGNOSIS — R4701 Aphasia: Secondary | ICD-10-CM

## 2021-08-25 DIAGNOSIS — F331 Major depressive disorder, recurrent, moderate: Secondary | ICD-10-CM | POA: Insufficient documentation

## 2021-08-25 DIAGNOSIS — Z8673 Personal history of transient ischemic attack (TIA), and cerebral infarction without residual deficits: Secondary | ICD-10-CM | POA: Diagnosis not present

## 2021-08-25 DIAGNOSIS — F411 Generalized anxiety disorder: Secondary | ICD-10-CM | POA: Diagnosis not present

## 2021-08-25 MED ORDER — SERTRALINE HCL 50 MG PO TABS: 50.0000 mg | ORAL_TABLET | Freq: Every day | ORAL | 1 refills | Status: DC

## 2021-08-25 NOTE — Assessment & Plan Note (Signed)
Improved!  ? ?Agree to continue with part time work with the schedule as outlined below: ? ?8a-3p on 03/30, 03/31, 04/03, 04/04, 04/05, 04/06, 04/07. ?Resume full time hours 04/17. Needs off for appointments on 05/16.  ? ?Will provide letter today. ?

## 2021-08-25 NOTE — Progress Notes (Signed)
? ?Subjective:  ? ? Patient ID: Amber [name], female    DOB: 1970-11-23, 51 y.o.   MRN: [id number] ? ?HPI ? ?Amber [name] is a very pleasant 51 y.o. female with a history of recurrent strokes, hypertension, type 2 diabetes, hyperlipidemia, expressive aphasia, tension headaches who presents today to discuss returning to work. ? ?She was last evaluated on 06/16/2021 for follow-up since her prior stroke.  During this visit she had shown improvement, was ready to return to work part-time, this was granted through her employer and through [place].  She is here today to discuss returning to work full-time. ? ?Since her last visit she's been working part time, found this to be a transition initially but has adapted well. She is now primarily working with elementary school students, had previously been working with middle school students, is driving back and forth to [place] which is a 45 min drive one way.  ? ?She is requesting to work 8a-3p, on 03/30, 03/31, 04/03, 04/04, 04/05, 04/06, 04/07.  ? ?Beginning full time work on April 17th, she will need off on 10/13/21 for an appointment with psychiatry and with [place].   ? ?Her depression has improved since she's been out of the house at work, but continues to struggle with depression including symptoms of fatigue, little motivation to do things at home. She's since lost her brother and her father is not doing well. She is compliant to her sertraline 25 mg daily, has an appointment with psychiatry in May 2023.  ? ? ?Review of Systems  ?Respiratory:  Negative for shortness of breath.   ?Cardiovascular:  Negative for chest pain.  ?Neurological:  Negative for speech difficulty and weakness.  ?Psychiatric/Behavioral:  Positive for sleep disturbance. The patient is nervous/anxious.   ?     See HPI  ? ?   ? ? ?Past Medical History:  ?Diagnosis Date  ? Anxiety   ? self reported  ? Asthma   ? Depression   ? controlled  ? Diabetes mellitus without complication ([place])   ? Hyperlipidemia    ? controlled with medication  ? IBS (irritable bowel syndrome)   ? Slurred speech   ? Stroke (cerebrum) ([place]) 01/07/2021  ? Stroke ([place]) 03/13/2015  ? Tension headache 11/18/2020  ? Word finding difficulty 02/27/2018  ? ? ?Social History  ? ?Socioeconomic History  ? Marital status: Single  ?  Spouse name: Not on file  ? Number of children: Not on file  ? Years of education: Not on file  ? Highest education level: Not on file  ?Occupational History  ? Not on file  ?Tobacco Use  ? Smoking status: Never  ? Smokeless tobacco: Never  ?Vaping Use  ? Vaping Use: Never used  ?Substance and Sexual Activity  ? Alcohol use: Not Currently  ?  Alcohol/week: 7.0 standard drinks  ?  Types: 7 Glasses of wine per week  ?  Comment: red wine occasionally but not since stroke  ? Drug use: Not Currently  ?  Types: Marijuana  ?  Comment: edibles-twice weekly  ? Sexual activity: Not Currently  ?Other Topics Concern  ? Not on file  ?Social History Narrative  ? Originally from [place]  ? Family lives up here in [place]  ? Has one daughter.  ? Enjoys spending time shopping and spending time with family   ? ?Social Determinants of Health  ? ?Financial Resource Strain: Not on file  ?Food Insecurity: Not on file  ?Transportation Needs: Not  on file  ?Physical Activity: Not on file  ?Stress: Not on file  ?Social Connections: Not on file  ?Intimate Partner Violence: Not on file  ? ? ?Past Surgical History:  ?Procedure Laterality Date  ? ABDOMINAL HYSTERECTOMY  10/2006  ? bowel reconstruction  10/2006  ? with hysterectomy  ? CESAREAN SECTION  2005  ? EP IMPLANTABLE DEVICE N/A 03/17/2015  ? Procedure: Loop Recorder Insertion;  Surgeon: Will Meredith Leeds, MD;  Location: [facility];  Service: Cardiovascular;  Laterality: N/A;  ? TEE WITHOUT CARDIOVERSION N/A 03/17/2015  ? Procedure: TRANSESOPHAGEAL ECHOCARDIOGRAM (TEE);  Surgeon: Skeet Latch, MD;  Location: [facility];  Service: Cardiovascular;  Laterality: N/A;  ? TEE  WITHOUT CARDIOVERSION N/A 01/06/2021  ? Procedure: TRANSESOPHAGEAL ECHOCARDIOGRAM (TEE);  Surgeon: Nelva Bush, MD;  Location: [facility];  Service: Cardiovascular;  Laterality: N/A;  ? TOE SURGERY    ? Left 2nd metatarsal  ? ? ?Family History  ?Problem Relation Age of Onset  ? Hyperlipidemia Mother   ? Arthritis Father   ? Diabetes Father   ? Hypertension Father   ? Hyperlipidemia Father   ? Prostate cancer Father   ? Prostate cancer Maternal Uncle   ?     x 3  ? Heart attack Maternal Grandfather   ? Hyperlipidemia Maternal Grandfather   ? Prostate cancer Maternal Grandfather   ? Diabetes Paternal Grandmother   ? Stroke Paternal Grandmother   ? Diabetes Paternal Grandfather   ? Heart attack Paternal Grandfather   ? Colon cancer Neg Hx   ? Colon polyps Neg Hx   ? Stomach cancer Neg Hx   ? Esophageal cancer Neg Hx   ? Pancreatic cancer Neg Hx   ? ? ?Allergies  ?Allergen Reactions  ? Cephalexin Hives and Rash  ?  Can take Augmentin  ? ? ?Current Outpatient Medications on File Prior to Visit  ?Medication Sig Dispense Refill  ? acetaminophen (TYLENOL) 325 MG tablet Take 1-2 tablets (325-650 mg total) by mouth every 4 (four) hours as needed for mild pain.    ? albuterol (VENTOLIN HFA) 108 (90 Base) MCG/ACT inhaler Inhale 2 puffs into the lungs every 4 (four) hours as needed for wheezing or shortness of breath (cough, shortness of breath or wheezing.). 1 each 1  ? Ascorbic Acid (VITAMIN C) 1000 MG tablet Take 1 tablet (1,000 mg total) by mouth daily. 30 tablet 0  ? azelastine (ASTELIN) 0.1 % nasal spray Place 1 spray into both nostrils 2 (two) times daily. Use in each nostril as directed 30 mL 0  ? cholecalciferol (VITAMIN D3) 25 MCG (1000 UNIT) tablet Take 1 tablet (1,000 Units total) by mouth daily. 30 tablet 0  ? clopidogrel (PLAVIX) 75 MG tablet Take 1 tablet by mouth once daily 90 tablet 0  ? Continuous Blood Gluc Receiver (FREESTYLE LIBRE 2 READER) DEVI Use to check blood sugars. 1 each 0  ? Continuous Blood Gluc  Sensor (FREESTYLE LIBRE 2 SENSOR) MISC Apply every 14 days to check blood sugars. 6 each 1  ? COVID-19 At Home Antigen Test KIT Use as directed. 1 kit 0  ? ezetimibe (ZETIA) 10 MG tablet Take 1 tablet (10 mg total) by mouth daily. For cholesterol. 90 tablet 1  ? famotidine (PEPCID) 20 MG tablet Take 1 tablet (20 mg total) by mouth 2 (two) times daily. For heartburn. 60 tablet 0  ? hydrochlorothiazide (HYDRODIURIL) 12.5 MG tablet Take 1 tablet by mouth once daily for blood pressure 90 tablet 3  ?  metFORMIN (GLUCOPHAGE-XR) 500 MG 24 hr tablet TAKE 1 TABLET BY MOUTH ONCE DAILY WITH BREAKFAST FOR DIABETES 90 tablet 1  ? methocarbamol (ROBAXIN) 500 MG tablet Take 1 tablet (500 mg total) by mouth every 8 (eight) hours as needed for muscle spasms. 30 tablet 0  ? metoprolol succinate (TOPROL XL) 25 MG 24 hr tablet Take 1 tablet (25 mg total) by mouth daily. 30 tablet 1  ? Multiple Vitamins-Minerals (MULTIVITAMIN WITH MINERALS) tablet Take 1 tablet by mouth daily.    ? pantoprazole (PROTONIX) 40 MG tablet Take 1 tablet (40 mg total) by mouth daily. 30 tablet 3  ? rosuvastatin (CRESTOR) 40 MG tablet Take 1 tablet (40 mg total) by mouth daily. For cholesterol. 90 tablet 3  ? sertraline (ZOLOFT) 25 MG tablet Take 1 tablet (25 mg total) by mouth daily. For anxiety. 90 tablet 3  ? topiramate (TOPAMAX) 25 MG tablet Take 1 tablet (25 mg total) by mouth daily. For headache prevention. 90 tablet 3  ? ?No current facility-administered medications on file prior to visit.  ? ? ?BP 126/82   Pulse 84   Temp 98.6 ?F (37 ?C) (Oral)   Ht '5\' 2"'  (1.575 m)   Wt 169 lb (76.7 kg)   SpO2 95%   BMI 30.91 kg/m?  ?Objective:  ? Physical Exam ?Cardiovascular:  ?   Rate and Rhythm: Normal rate and regular rhythm.  ?Pulmonary:  ?   Effort: Pulmonary effort is normal.  ?   Breath sounds: Normal breath sounds.  ?Musculoskeletal:  ?   Cervical back: Neck supple.  ?Skin: ?   General: Skin is warm and dry.  ?Neurological:  ?   Mental Status: She is  oriented to person, place, and time.  ?   Comments: Speech continues to improve, holding conversation in complete sentences.   ? ? ? ? ? ?   ?Assessment & Plan:  ? ? ? ? ?This visit occurred during the SARS-CoV-2 p

## 2021-08-25 NOTE — Assessment & Plan Note (Signed)
Improving but not at goal. ? ?Increase sertraline to 50 mg daily, new prescription sent to pharmacy.  She will follow-up with psychiatry as scheduled in May. ? ?

## 2021-08-25 NOTE — Patient Instructions (Signed)
It was a pleasure to see you today!   

## 2021-08-25 NOTE — Assessment & Plan Note (Signed)
Improving but not at goal. ? ?Increase sertraline to 50 mg daily, new prescription sent to pharmacy.  She will follow-up with psychiatry as scheduled in May. ? ?

## 2021-08-25 NOTE — Assessment & Plan Note (Signed)
Significant improvement since her stroke. ? ?Continue with part-time work with the plan outlined in HPI. ?Resume full-time work on 4/17. ? ?We will see her back in May 2023 for follow-up ?

## 2021-09-15 ENCOUNTER — Other Ambulatory Visit: Payer: Self-pay | Admitting: Primary Care

## 2021-09-15 DIAGNOSIS — E785 Hyperlipidemia, unspecified: Secondary | ICD-10-CM

## 2021-09-16 NOTE — Progress Notes (Deleted)
Cardiology Office Note    Date:  09/16/2021   ID:  Amber [name], DOB Aug 07, 1970, MRN [id number]  PCP:  Pleas Koch, NP  Cardiologist:  Nelva Bush, MD  Electrophysiologist:  None   Chief Complaint: Follow up  History of Present Illness:   Amber [name] is a 51 y.o. female with history of recurrent strokes, DM2, HLD, Covid in 04/2021, asthma, IBS, and anxiety who presents for follow up of Zio patch monitoring.   She was admitted in 2016 with a left MCA infarct with an embolic pattern secondary to unknown source.  2D surface echo at that time demonstrated an EF of 50%, no regional wall motion abnormalities, grade 1 diastolic dysfunction, mild mitral regurgitation, normal-sized left atrium, normal RV systolic function and ventricular cavity size.  TEE revealed an EF of 45 to 50%, diffuse global hypokinesis, trivial mitral regurgitation, no PFO or ASD, no left atrial or left atrial appendage thrombus.  Transcranial Doppler was unrevealing.  She subsequently underwent ILR implantation.  Device interrogation has demonstrated no evidence of significant arrhythmia.  She was readmitted in 05/2016 with CVA with echo at that time demonstrating an EF of 55 to 60%, normal wall motion, normal LV diastolic function parameters, no PFO.  She was admitted in 12/2020 with recurrent stroke.  Repeat TEE showed no intracardiac thrombus or shunt.  Linear echodensity in the ascending aorta was noted and felt to most likely be artifact.  Subsequent CTA of the chest showed no significant abnormality.  She reestablished care with our office in 01/2021 for evaluation of chest pain.  She had recently visited the ED, and left without being seen due to prolonged wait and symptomatic improvement.  Subsequent coronary CTA on 02/12/2021 showed a calcium score of 0 with no evidence of CAD.  She was referred to EP for consideration of repeat ILR implantation, though pre-certification was not obtained.  She was seen in the  office in 02/2021 and was doing fairly well from a cardiac perspective.  She did report occasional palpitations that were improved when compared to her prior visit.  She was subsequently reluctant to proceed with loop recorder reimplantation.  It was also noted recent carotid Doppler showed only mild right internal carotid artery stenosis.  She wished to think about further outpatient cardiac monitoring.  She was last seen in 05/2021, noting she was approximately "60% better." She was interested in pursuing outpatient cardiac monitoring, which subsequently showed a predominant rhythm of sinus with an average heart rate of 87 bpm (range 51-161 bpm) with rare PACs and PVCs.  No sustained arrhythmias or prolonged pauses were noted.   ***   Labs independently reviewed: 05/2021 - TC 140, TG 70, HDL 68, LDL 57, A1c 5.9, potassium 3.7, BUN 13, SCr 1.09, HGB 14.7, PLT 249 12/2020 - albumin 3.5, AST/ALT normal, magnesium 2.1 08/2020 - TSH normal  Past Medical History:  Diagnosis Date   Anxiety    self reported   Asthma    Depression    controlled   Diabetes mellitus without complication ([place])    Hyperlipidemia    controlled with medication   IBS (irritable bowel syndrome)    Slurred speech    Stroke (cerebrum) ([place]) 01/07/2021   Stroke ([place]) 03/13/2015   Tension headache 11/18/2020   Word finding difficulty 02/27/2018    Past Surgical History:  Procedure Laterality Date   ABDOMINAL HYSTERECTOMY  10/2006   bowel reconstruction  10/2006   with hysterectomy   CESAREAN SECTION  2005  EP IMPLANTABLE DEVICE N/A 03/17/2015   Procedure: Loop Recorder Insertion;  Surgeon: Will Meredith Leeds, MD;  Location: [facility];  Service: Cardiovascular;  Laterality: N/A;   TEE WITHOUT CARDIOVERSION N/A 03/17/2015   Procedure: TRANSESOPHAGEAL ECHOCARDIOGRAM (TEE);  Surgeon: Skeet Latch, MD;  Location: [facility];  Service: Cardiovascular;  Laterality: N/A;   TEE WITHOUT CARDIOVERSION N/A 01/06/2021    Procedure: TRANSESOPHAGEAL ECHOCARDIOGRAM (TEE);  Surgeon: Nelva Bush, MD;  Location: [facility];  Service: Cardiovascular;  Laterality: N/A;   TOE SURGERY     Left 2nd metatarsal    Current Medications: No outpatient medications have been marked as taking for the 09/21/21 encounter (Appointment) with Amber Mu, PA-C.    Allergies:   Cephalexin   Social History   Socioeconomic History   Marital status: Single    Spouse name: Not on file   Number of children: Not on file   Years of education: Not on file   Highest education level: Not on file  Occupational History   Not on file  Tobacco Use   Smoking status: Never   Smokeless tobacco: Never  Vaping Use   Vaping Use: Never used  Substance and Sexual Activity   Alcohol use: Not Currently    Alcohol/week: 7.0 standard drinks    Types: 7 Glasses of wine per week    Comment: red wine occasionally but not since stroke   Drug use: Not Currently    Types: Marijuana    Comment: edibles-twice weekly   Sexual activity: Not Currently  Other Topics Concern   Not on file  Social History Narrative   Originally from [place] lives up here in [place]   Has one daughter.   Enjoys spending time shopping and spending time with family    Social Determinants of Health   Financial Resource Strain: Not on file  Food Insecurity: Not on file  Transportation Needs: Not on file  Physical Activity: Not on file  Stress: Not on file  Social Connections: Not on file     Family History:  The patient's family history includes Arthritis in her father; Diabetes in her father, paternal grandfather, and paternal grandmother; Heart attack in her maternal grandfather and paternal grandfather; Hyperlipidemia in her father, maternal grandfather, and mother; Hypertension in her father; Prostate cancer in her father, maternal grandfather, and maternal uncle; Stroke in her paternal grandmother. There is no history of Colon  cancer, Colon polyps, Stomach cancer, Esophageal cancer, or Pancreatic cancer.  ROS:   ROS   EKGs/Labs/Other Studies Reviewed:    Studies reviewed were summarized above. The additional studies were reviewed today:  2D echo 03/14/2015: - Left ventricle: The cavity size was normal. Systolic function was    normal. The estimated ejection fraction was 50%. Wall motion was    normal; there were no regional wall motion abnormalities. Doppler    parameters are consistent with abnormal left ventricular    relaxation (grade 1 diastolic dysfunction). Doppler parameters    are consistent with elevated ventricular end-diastolic filling    pressure.  - Aortic valve: Structurally normal valve. There was no    regurgitation.  - Mitral valve: Structurally normal valve. There was mild    regurgitation.  - Left atrium: The atrium was normal in size.  - Right ventricle: The cavity size was normal. Wall thickness was    normal. Systolic function was normal.  - Right atrium: The atrium was normal in size.  - Tricuspid valve:  There was mild regurgitation.  - Pulmonic valve: There was trivial regurgitation.  - Inferior vena cava: The vessel was normal in size. The    respirophasic diameter changes were in the normal range (>= 50%),    consistent with normal central venous pressure.  - Pericardium, extracardiac: The pericardium was normal in    appearance.   Impressions:   - LVEF is mildly impaired with diffuse hypokinesis.    Abnormal relaxation with mildly elevated filling pressures.    Mild MR and TR. __________   Transcranial doppler with bubbles 03/14/2015: Negative TCD Bubble study.No high intensity transient signals  (HITS) heard at rest or with valsalva. Therefore, there is no  apparent PFO. __________   TEE 03/17/2015: - Left ventricle: Systolic function was mildly reduced. The    estimated ejection fraction was in the range of 45% to 50%.    Diffuse hypokinesis.  - Left atrium:  No evidence of thrombus in the atrial cavity or    appendage.  - Right atrium: No evidence of thrombus in the atrial cavity or    appendage.  - Atrial septum: No defect or patent foramen ovale was identified.    Echo contrast study showed no right-to-left atrial level shunt,    at baseline or with provocation. Negative saline microcavitation    study. ___________   2D echo 06/11/2016: - Left ventricle: The cavity size was normal. Wall thickness was    normal. Systolic function was normal. The estimated ejection    fraction was in the range of 55% to 60%. Wall motion was normal;    there were no regional wall motion abnormalities. Left    ventricular diastolic function parameters were normal.  - Atrial septum: No defect or patent foramen ovale was identified.   Impressions:   - No cardiac source of emboli was indentified. __________   2D echo 01/04/2021: 1. Left ventricular ejection fraction, by estimation, is 55 to 60%. The  left ventricle has normal function. The left ventricle has no regional  wall motion abnormalities. Left ventricular diastolic parameters are  consistent with Grade I diastolic  dysfunction (impaired relaxation).   2. Right ventricular systolic function is normal. The right ventricular  size is normal.   3. The mitral valve is degenerative. Mild mitral valve regurgitation.   4. The aortic valve is tricuspid. Aortic valve regurgitation is not  visualized.   5. Agitated saline contrast bubble study was negative, with no evidence  of any interatrial shunt. __________   TEE 01/06/2021: 1. Left ventricular ejection fraction, by estimation, is 55 to 60%. The  left ventricle has normal function.   2. Right ventricular systolic function is normal. The right ventricular  size is normal.   3. No left atrial/left atrial appendage thrombus was detected. The LAA  emptying velocity was 76 cm/s.   4. The mitral valve is abnormal. Mild mitral valve regurgitation.   5. The  aortic valve is tricuspid. Aortic valve regurgitation is not  visualized.   6. There is a linear echodensity in and adjacent to the ascending aorta  that most likely represents artifact. However, given cryptogenic strokes,  further evaluation with CTA could be helpful to exclude aortic pathology.   7. Agitated saline contrast bubble study was negative, with no evidence  of any interatrial shunt. __________   Coronary artery CTA 02/12/2021: FINDINGS: Aorta:  Normal size.  No calcifications.  No dissection.   Aortic Valve:  Trileaflet.  No calcifications.   Coronary Arteries:  Normal  coronary origin.  Right dominance.   RCA is a dominant artery that gives Amber to PDA and PLA. There is no plaque.   Left main is a large artery that gives Amber to LAD and LCX arteries.   LAD has no plaque.   LCX is a non-dominant artery that gives Amber to two obtuse marginal branches. There is no plaque.   Other findings:   Normal pulmonary vein drainage into the left atrium.   Normal left atrial appendage without a thrombus.   Normal size of the pulmonary artery.   IMPRESSION: 1. Normal coronary calcium score of 0. Patient is low risk for coronary events. 2. Normal coronary origin with right dominance. 3. No evidence of CAD. 4. CAD-RADS 0. Consider non-atherosclerotic causes of chest pain. __________  Elwyn Reach patch 05/2021: The patient was monitored for 14 days. The predominant rhythm was sinus with an average rate of 87 bpm (range 52-161 bpm). There were rare PACs and PVCs. No sustained arrhythmia or prolonged pause was identified. Single patient triggered event corresponds to normal sinus rhythm.   Predominantly sinus rhythm with rare PACs and PVCs.  No significant arrhythmia identified. __________   EKG:  EKG is ordered today.  The EKG ordered today demonstrates ***  Recent Labs: 09/16/2020: TSH 1.45 01/04/2021: Magnesium 2.1 01/08/2021: ALT 35 02/03/2021: BUN 13; Creatinine, Ser 1.09;  Hemoglobin 14.7; Platelets 249; Potassium 3.7; Sodium 138  Recent Lipid Panel    Component Value Date/Time   CHOL 140 06/16/2021 0810   CHOL 184 08/28/2019 1537   TRIG 70.0 06/16/2021 0810   HDL 68.60 06/16/2021 0810   HDL 75 08/28/2019 1537   CHOLHDL 2 06/16/2021 0810   VLDL 14.0 06/16/2021 0810   LDLCALC 57 06/16/2021 0810   LDLCALC 85 08/28/2019 1537    PHYSICAL EXAM:    VS:  There were no vitals taken for this visit.  BMI: There is no height or weight on file to calculate BMI.  Physical Exam  Wt Readings from Last 3 Encounters:  08/25/21 169 lb (76.7 kg)  06/22/21 176 lb (79.8 kg)  06/18/21 172 lb (78 kg)     ASSESSMENT & PLAN:   Recurrent CVAs:  Chest pain and palpitations:  HLD associated with DM2: LDL 57 in 05/2021.   {Are you ordering a CV Procedure (e.g. stress test, cath, DCCV, TEE, etc)?   Press F2        :[id number]     Disposition: F/u with Dr. Saunders Revel or an APP in ***.   Medication Adjustments/Labs and Tests Ordered: Current medicines are reviewed at length with the patient today.  Concerns regarding medicines are outlined above. Medication changes, Labs and Tests ordered today are summarized above and listed in the Patient Instructions accessible in Encounters.   Signed, Christell Faith, PA-C 09/16/2021 10:18 AM     [facility] [place] [place], [place] [address] [phone]

## 2021-09-19 ENCOUNTER — Other Ambulatory Visit: Payer: Self-pay | Admitting: Primary Care

## 2021-09-19 DIAGNOSIS — Z8673 Personal history of transient ischemic attack (TIA), and cerebral infarction without residual deficits: Secondary | ICD-10-CM

## 2021-09-21 ENCOUNTER — Ambulatory Visit: Payer: BC Managed Care – PPO | Admitting: Physician Assistant

## 2021-09-21 DIAGNOSIS — E1169 Type 2 diabetes mellitus with other specified complication: Secondary | ICD-10-CM

## 2021-09-21 DIAGNOSIS — I639 Cerebral infarction, unspecified: Secondary | ICD-10-CM

## 2021-09-21 DIAGNOSIS — R002 Palpitations: Secondary | ICD-10-CM

## 2021-09-28 DIAGNOSIS — R002 Palpitations: Secondary | ICD-10-CM

## 2021-09-28 DIAGNOSIS — I639 Cerebral infarction, unspecified: Secondary | ICD-10-CM | POA: Diagnosis not present

## 2021-10-12 IMAGING — CR DG CHEST 2V
2 series · 2 of 2 positions shown · non-contrast
Comparison: CT angiogram chest 01/06/2021.  Chest x-ray 02/13/2017.

CLINICAL DATA: Chest pain.

EXAM:
CHEST - 2 VIEW

[chest pa]
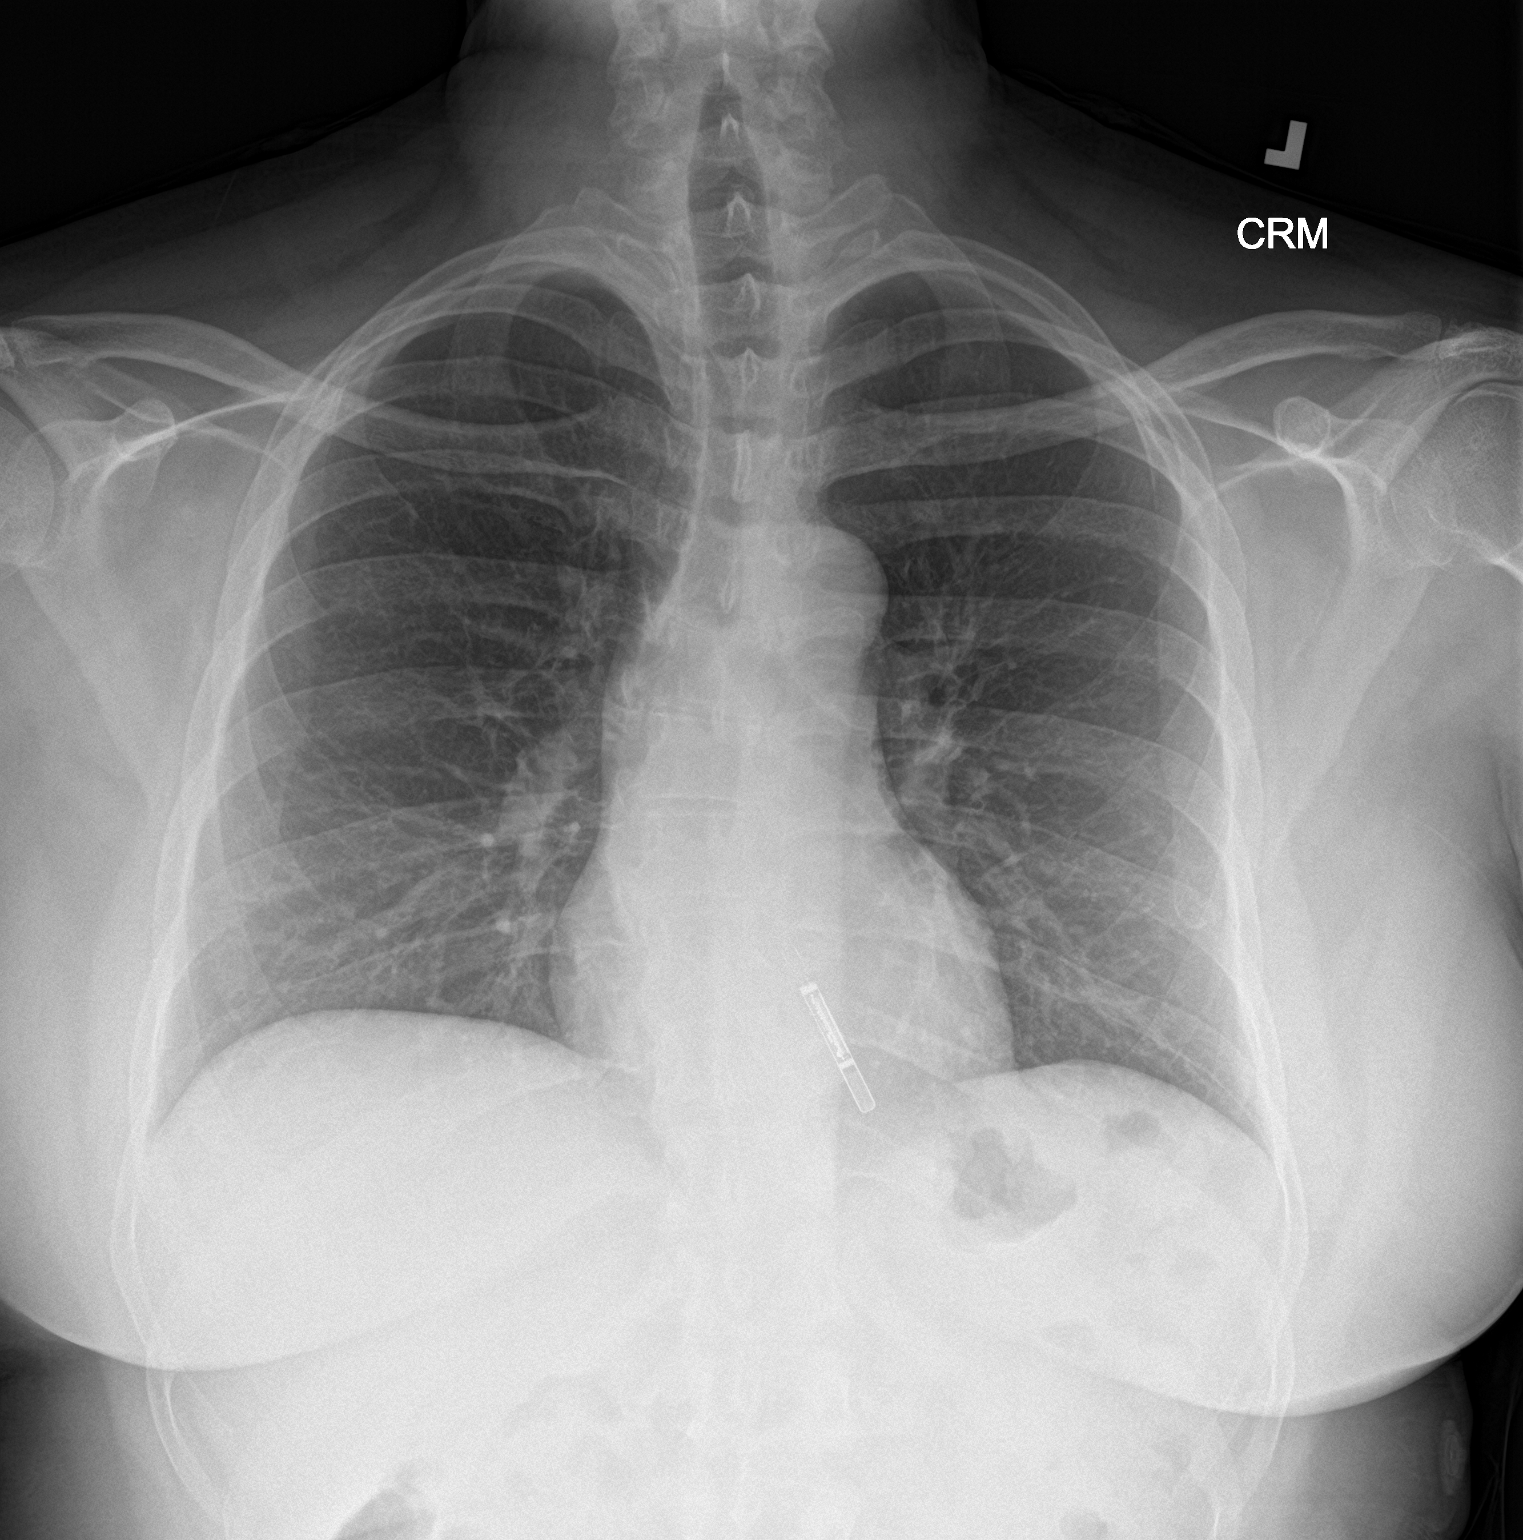

[chest lat]
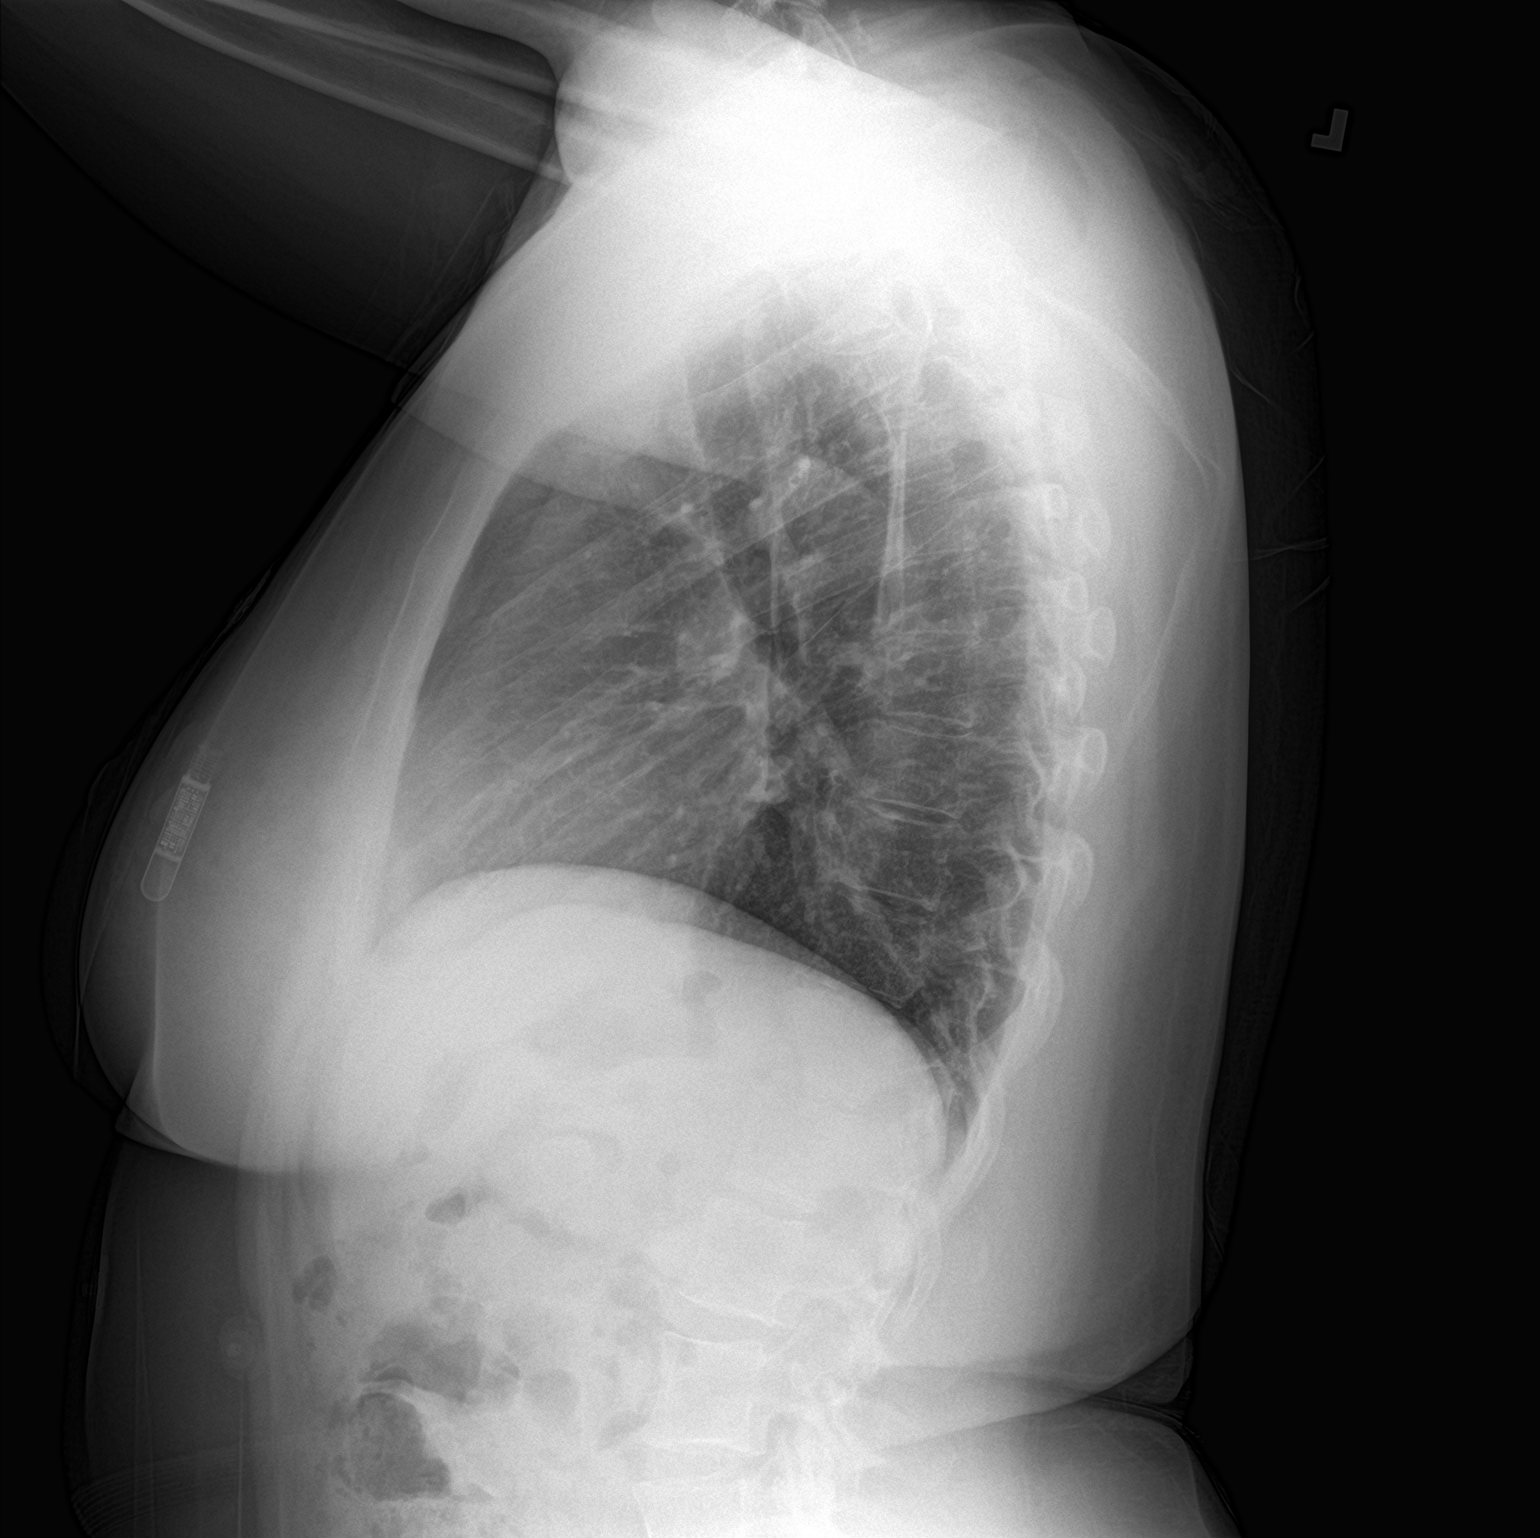

[2 of 2 positions shown; findings below may reference images not displayed]

FINDINGS: Radiopaque loop recorder device is again noted in the anterior chest
wall. The heart size and mediastinal contours are within normal
limits. Both lungs are clear. The visualized skeletal structures are
unremarkable.
IMPRESSION: No active cardiopulmonary disease.

## 2021-10-14 ENCOUNTER — Ambulatory Visit: Payer: BC Managed Care – PPO | Admitting: Primary Care

## 2021-10-19 ENCOUNTER — Ambulatory Visit: Payer: BC Managed Care – PPO | Admitting: Physician Assistant

## 2021-10-27 ENCOUNTER — Ambulatory Visit: Payer: BC Managed Care – PPO | Admitting: Primary Care

## 2021-10-30 ENCOUNTER — Ambulatory Visit (INDEPENDENT_AMBULATORY_CARE_PROVIDER_SITE_OTHER): Payer: BC Managed Care – PPO | Admitting: Primary Care

## 2021-10-30 ENCOUNTER — Encounter: Payer: Self-pay | Admitting: Primary Care

## 2021-10-30 VITALS — BP 124/72 | HR 68 | Temp 98.6°F | Ht 62.0 in | Wt 172.0 lb

## 2021-10-30 DIAGNOSIS — I10 Essential (primary) hypertension: Secondary | ICD-10-CM

## 2021-10-30 DIAGNOSIS — R4701 Aphasia: Secondary | ICD-10-CM

## 2021-10-30 DIAGNOSIS — F411 Generalized anxiety disorder: Secondary | ICD-10-CM | POA: Diagnosis not present

## 2021-10-30 DIAGNOSIS — R7989 Other specified abnormal findings of blood chemistry: Secondary | ICD-10-CM

## 2021-10-30 DIAGNOSIS — Z8673 Personal history of transient ischemic attack (TIA), and cerebral infarction without residual deficits: Secondary | ICD-10-CM

## 2021-10-30 DIAGNOSIS — E118 Type 2 diabetes mellitus with unspecified complications: Secondary | ICD-10-CM | POA: Diagnosis not present

## 2021-10-30 DIAGNOSIS — F331 Major depressive disorder, recurrent, moderate: Secondary | ICD-10-CM

## 2021-10-30 DIAGNOSIS — Z1159 Encounter for screening for other viral diseases: Secondary | ICD-10-CM

## 2021-10-30 LAB — COMPREHENSIVE METABOLIC PANEL
ALT: 106 U/L — ABNORMAL HIGH (ref 0–35)
AST: 50 U/L — ABNORMAL HIGH (ref 0–37)
Albumin: 4.3 g/dL (ref 3.5–5.2)
Alkaline Phosphatase: 66 U/L (ref 39–117)
BUN: 13 mg/dL (ref 6–23)
CO2: 30 mEq/L (ref 19–32)
Calcium: 10 mg/dL (ref 8.4–10.5)
Chloride: 104 mEq/L (ref 96–112)
Creatinine, Ser: 0.97 mg/dL (ref 0.40–1.20)
GFR: 68.81 mL/min (ref >60.00)
Glucose, Bld: 92 mg/dL (ref 70–99)
Potassium: 4.3 mEq/L (ref 3.5–5.1)
Sodium: 141 mEq/L (ref 135–145)
Total Bilirubin: 0.5 mg/dL (ref 0.2–1.2)
Total Protein: 7.9 g/dL (ref 6.0–8.3)

## 2021-10-30 LAB — MICROALBUMIN / CREATININE URINE RATIO
Creatinine,U: 82.2 mg/dL
Microalb Creat Ratio: 0.9 mg/g (ref 0.0–30.0)
Microalb, Ur: <0.7 mg/dL (ref 0.0–1.9)

## 2021-10-30 LAB — HEMOGLOBIN A1C: Hgb A1c MFr Bld: 6.2 % (ref 4.6–6.5)

## 2021-10-30 NOTE — Progress Notes (Signed)
Subjective:    Patient ID: Amber [name], female    DOB: [birth year], 51 y.o.   MRN: [id number]  HPI  Amber [name] is a very pleasant 51 y.o. female with a history of hypertension, recurrent strokes, type 2 diabetes, GAD, GERD, IBS, MDD, expressive aphasia who presents today to discuss   She returned to full time work in mid April 2023. Evaluated by psychiatry about 2 weeks ago who increased the dose of her sertraline to 150 mg. She was referred to therapy but she cannot get in until July. She's been under a lot of stress with family health issues, including her daughter who found a breast mass and father who is now on Hospice. She continues to feel depressed, understands that she needs to see a therapist.   She is requesting short term leave from work given her family stress. She is taking out short term disability with a start date of November 05, 2021 through December 20, 2021 with a return to work date of December 21, 2021.   She has follow up scheduled with both the neurologist and cardiologist.   BP Readings from Last 3 Encounters:  10/30/21 124/72  08/25/21 126/82  06/22/21 110/80        Review of Systems  Respiratory:  Negative for shortness of breath.   Cardiovascular:  Negative for chest pain.  Neurological:  Positive for weakness. Negative for dizziness and speech difficulty.       Residual left sided weakness, no increased weakness.   Psychiatric/Behavioral:         See HPI  Anxiety and depression         Past Medical History:  Diagnosis Date   Anxiety    self reported   Asthma    Depression    controlled   Diabetes mellitus without complication ([facility])    Hyperlipidemia    controlled with medication   IBS (irritable bowel syndrome)    Slurred speech    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   Stroke ([facility]) 03/13/2015   Tension headache 11/18/2020   Word finding difficulty 02/27/2018    Social History   Socioeconomic History   Marital status: Single    Spouse  name: Not on file   Number of children: Not on file   Years of education: Not on file   Highest education level: Not on file  Occupational History   Not on file  Tobacco Use   Smoking status: Never   Smokeless tobacco: Never  Vaping Use   Vaping Use: Never used  Substance and Sexual Activity   Alcohol use: Not Currently    Alcohol/week: 7.0 standard drinks    Types: 7 Glasses of wine per week    Comment: red wine occasionally but not since stroke   Drug use: Not Currently    Types: Marijuana    Comment: edibles-twice weekly   Sexual activity: Not Currently  Other Topics Concern   Not on file  Social History Narrative   Originally from [place]   Family lives up here in [place]   Has one daughter.   Enjoys spending time shopping and spending time with family    Social Determinants of Health   Financial Resource Strain: Not on file  Food Insecurity: Not on file  Transportation Needs: Not on file  Physical Activity: Not on file  Stress: Not on file  Social Connections: Not on file  Intimate Partner Violence: Not on file    Past Surgical History:  Procedure Laterality Date   ABDOMINAL HYSTERECTOMY  10/2006   bowel reconstruction  10/2006   with hysterectomy   CESAREAN SECTION  2005   EP IMPLANTABLE DEVICE N/A 03/17/2015   Procedure: Loop Recorder Insertion;  Surgeon: Will Jorja Loa, MD;  Location: [facility];  Service: Cardiovascular;  Laterality: N/A;   TEE WITHOUT CARDIOVERSION N/A 03/17/2015   Procedure: TRANSESOPHAGEAL ECHOCARDIOGRAM (TEE);  Surgeon: Chilton Si, MD;  [facility]: [facility];  Service: Cardiovascular;  Laterality: N/A;   TEE WITHOUT CARDIOVERSION N/A 01/06/2021   Procedure: TRANSESOPHAGEAL ECHOCARDIOGRAM (TEE);  Surgeon: [name], MD;  Location: [facility];  Service: Cardiovascular;  Laterality: N/A;   TOE SURGERY     Left 2nd metatarsal    Family History  Problem Relation Age of Onset   Hyperlipidemia Mother     Arthritis Father    Diabetes Father    Hypertension Father    Hyperlipidemia Father    Prostate cancer Father    Prostate cancer Maternal Uncle        x 3   Heart attack Maternal Grandfather    Hyperlipidemia Maternal Grandfather    Prostate cancer Maternal Grandfather    Diabetes Paternal Grandmother    Stroke Paternal Grandmother    Diabetes Paternal Grandfather    Heart attack Paternal Grandfather    Colon cancer Neg Hx    Colon polyps Neg Hx    Stomach cancer Neg Hx    Esophageal cancer Neg Hx    Pancreatic cancer Neg Hx     Allergies  Allergen Reactions   Cephalexin Hives and Rash    Can take Augmentin    Current Outpatient Medications on File Prior to Visit  Medication Sig Dispense Refill   acetaminophen (TYLENOL) 325 MG tablet Take 1-2 tablets (325-650 mg total) by mouth every 4 (four) hours as needed for mild pain.     albuterol (VENTOLIN HFA) 108 (90 Base) MCG/ACT inhaler Inhale 2 puffs into the lungs every 4 (four) hours as needed for wheezing or shortness of breath (cough, shortness of breath or wheezing.). 1 each 1   Ascorbic Acid (VITAMIN C) 1000 MG tablet Take 1 tablet (1,000 mg total) by mouth daily. 30 tablet 0   azelastine (ASTELIN) 0.1 % nasal spray Place 1 spray into both nostrils 2 (two) times daily. Use in each nostril as directed 30 mL 0   cholecalciferol (VITAMIN D3) 25 MCG (1000 UNIT) tablet Take 1 tablet (1,000 Units total) by mouth daily. 30 tablet 0   clopidogrel (PLAVIX) 75 MG tablet Take 1 tablet by mouth once daily 90 tablet 1   ezetimibe (ZETIA) 10 MG tablet TAKE 1 TABLET BY MOUTH ONCE DAILY FOR CHOLESTEROL 90 tablet 2   famotidine (PEPCID) 20 MG tablet Take 1 tablet (20 mg total) by mouth 2 (two) times daily. For heartburn. 60 tablet 0   hydrochlorothiazide (HYDRODIURIL) 12.5 MG tablet Take 1 tablet by mouth once daily for blood pressure 90 tablet 3   metFORMIN (GLUCOPHAGE-XR) 500 MG 24 hr tablet TAKE 1 TABLET BY MOUTH ONCE DAILY WITH BREAKFAST  FOR DIABETES 90 tablet 1   metoprolol succinate (TOPROL XL) 25 MG 24 hr tablet Take 1 tablet (25 mg total) by mouth daily. 30 tablet 1   Multiple Vitamins-Minerals (MULTIVITAMIN WITH MINERALS) tablet Take 1 tablet by mouth daily.     pantoprazole (PROTONIX) 40 MG tablet Take 1 tablet (40 mg total) by mouth daily. 30 tablet 3   rosuvastatin (CRESTOR) 40 MG tablet Take 1  tablet (40 mg total) by mouth daily. For cholesterol. 90 tablet 3   sertraline (ZOLOFT) 50 MG tablet Take 1 tablet (50 mg total) by mouth daily. for anxiety and depression. (Patient taking differently: Take 150 mg by mouth daily. for anxiety and depression.) 90 tablet 1   topiramate (TOPAMAX) 25 MG tablet Take 1 tablet (25 mg total) by mouth daily. For headache prevention. 90 tablet 3   Continuous Blood Gluc Receiver (FREESTYLE LIBRE 2 READER) DEVI Use to check blood sugars. (Patient not taking: Reported on 10/30/2021) 1 each 0   Continuous Blood Gluc Sensor (FREESTYLE LIBRE 2 SENSOR) MISC Apply every 14 days to check blood sugars. (Patient not taking: Reported on 10/30/2021) 6 each 1   No current facility-administered medications on file prior to visit.    BP 124/72   Pulse 68   Temp 98.6 F (37 C) (Oral)   Ht 5\' 2"  (1.575 m)   Wt 172 lb (78 kg)   SpO2 96%   BMI 31.46 kg/m  Objective:   Physical Exam Eyes:     Extraocular Movements: Extraocular movements intact.  Cardiovascular:     Rate and Rhythm: Normal rate and regular rhythm.  Pulmonary:     Effort: Pulmonary effort is normal.     Breath sounds: Normal breath sounds.  Musculoskeletal:     Cervical back: Neck supple.     Comments: 4/5 strength to left upper and lower extremities. 5/5 strength to right lower extremities.  Skin:    General: Skin is warm and dry.  Neurological:     Mental Status: She is oriented to person, place, and time.     Cranial Nerves: No cranial nerve deficit.          Assessment & Plan:

## 2021-10-30 NOTE — Assessment & Plan Note (Signed)
Continues to improve!  Continue to monitor.

## 2021-10-30 NOTE — Assessment & Plan Note (Signed)
Uncontrolled.  Referral placed for therapy. Continue sertraline 150 mg daily and psychiatry follow up.   Agree to provide short term disability leave for MDD and GAD with start date of 11/05/21 through 12/20/21, return to work date of 12/21/21. She will have her HR group send paperwork.

## 2021-10-30 NOTE — Assessment & Plan Note (Signed)
No new symptoms.  Continue BP control, A1C control, Lipid control.  Following with neurology.

## 2021-10-30 NOTE — Patient Instructions (Signed)
Stop by the lab prior to leaving today. I will notify you of your results once received.   Please have your HR department send paperwork.  You will be contacted regarding your referral to therapy.  Please let us know if you have not been contacted within two weeks.   Please schedule a follow up visit for 6 months for a diabetes check.  It was a pleasure to see you today!

## 2021-10-30 NOTE — Assessment & Plan Note (Signed)
Controlled.  Continue HCTZ 12.5 mg daily. BMP pending. 

## 2021-10-30 NOTE — Assessment & Plan Note (Signed)
Repeat A1C pending.  Urine microalbumin due and pending.  Managed on statin. Continue metformin XR 500 mg daily.  Pneumonia vaccine UTD.  Follow up in 6 months.

## 2021-10-30 NOTE — Assessment & Plan Note (Signed)
Uncontrolled.  Referral placed for therapy. Continue sertraline 150 mg daily and psychiatry follow up.   Agree to provide short term disability leave for MDD and GAD with start date of 11/05/21 through 12/20/21, return to work date of 12/21/21. She will have her HR group send paperwork. 

## 2021-10-31 DIAGNOSIS — Z0279 Encounter for issue of other medical certificate: Secondary | ICD-10-CM

## 2021-11-02 LAB — HEPATITIS C ANTIBODY
Hepatitis C Ab: NONREACTIVE
SIGNAL TO CUT-OFF: 0.07 (ref <1.00)

## 2021-11-06 ENCOUNTER — Telehealth: Payer: Self-pay

## 2021-11-06 NOTE — Telephone Encounter (Signed)
Type of forms received: FMLA ? ?Routed to:Joellen T ? ?Paperwork received by : Ronnett Pullin S ? ? ?Individual made aware of 3-5 business day turn around (Y/N): Y ? ?Form completed and patient made aware of charges(Y/N): Y ? ? ?Faxed to :  ? ?Form location: Place in folder ? ?

## 2021-11-06 NOTE — Telephone Encounter (Signed)
Ppw was duplicate gave all to Lenon Curt for review.

## 2021-11-06 NOTE — Telephone Encounter (Signed)
Completed and placed in Joellen's inbox. 

## 2021-11-06 NOTE — Telephone Encounter (Signed)
We received short term disability forms for Amber [name].  I have made a copy and placed them in your inbox for completion and signing.

## 2021-11-09 ENCOUNTER — Ambulatory Visit: Payer: BC Managed Care – PPO | Admitting: Family Medicine

## 2021-11-09 NOTE — Telephone Encounter (Signed)
Faxed paperwork to [phone].  I left a msg for patient to call back to see if she wants a copy mailed to her or if she wants to pick one up from the front office.   Made copies for myself, patient, billing, and scanning.

## 2021-11-11 DIAGNOSIS — Z0279 Encounter for issue of other medical certificate: Secondary | ICD-10-CM

## 2021-11-11 NOTE — Telephone Encounter (Signed)
Patient is coming by to pick up copy of paperwork.

## 2021-11-11 NOTE — Telephone Encounter (Signed)
Patient dropped off an additional form for short term disability leave that her company needs.  I have made a copy and placed in Dr Elmyra Ricks inbox for completion and signing.

## 2021-11-11 NOTE — Telephone Encounter (Signed)
Type of forms received:FMLA  Routed to:Joellen T  Paperwork received by : Ott Zimmerle S   Individual made aware of 3-5 business day turn around (Y/N): Y  Form completed and patient made aware of charges(Y/N): Y   Faxed to :   Form location: Place in PCP folder  

## 2021-11-12 NOTE — Telephone Encounter (Signed)
Paperwork completed and placed in box for pick-up 

## 2021-11-12 NOTE — Telephone Encounter (Signed)
Completed forms were faxed to F# [phone].  Copies made for myself, patient, billing, and scanning.  Patient's daughter will be by later to pick up paperwork.

## 2021-11-18 ENCOUNTER — Ambulatory Visit: Payer: BC Managed Care – PPO | Admitting: Family Medicine

## 2021-11-18 ENCOUNTER — Ambulatory Visit
Admission: RE | Admit: 2021-11-18 | Discharge: 2021-11-18 | Disposition: A | Discharge status: Home / Self Care | Payer: BC Managed Care – PPO | Source: Ambulatory Visit | Attending: Primary Care | Admitting: Primary Care

## 2021-11-18 ENCOUNTER — Encounter: Payer: Self-pay | Admitting: Family Medicine

## 2021-11-18 DIAGNOSIS — L03039 Cellulitis of unspecified toe: Secondary | ICD-10-CM | POA: Insufficient documentation

## 2021-11-18 DIAGNOSIS — R7989 Other specified abnormal findings of blood chemistry: Secondary | ICD-10-CM | POA: Diagnosis not present

## 2021-11-18 DIAGNOSIS — L03031 Cellulitis of right toe: Secondary | ICD-10-CM

## 2021-11-18 HISTORY — DX: Cellulitis of unspecified toe: L03.039

## 2021-11-18 MED ORDER — AMOXICILLIN-POT CLAVULANATE 875-125 MG PO TABS: 1.0000 | ORAL_TABLET | Freq: Two times a day (BID) | ORAL | 0 refills | Status: DC

## 2021-11-18 NOTE — Progress Notes (Signed)
Subjective:    Patient ID: Amber [name], female    DOB: 11-07-1970, 51 y.o.   MRN: [id number]  HPI 51 yo pt of NP Clark presents for pain in her toe   Wt Readings from Last 3 Encounters:  11/18/21 171 lb 3.2 oz (77.7 kg)  10/30/21 172 lb (78 kg)  08/25/21 169 lb (76.7 kg)   31.31 kg/m   L great toe pain  ? If this started with a bug bite  2 weeks  Is swollen  No drainage   Is a diabetic  Lab Results  Component Value Date   HGBA1C 6.2 10/30/2021   Has issues with dry skin and itching    Past hx of fracture in her L 1,2nd toe in the past - dropped heavy object and they had to remove part of her toe nail   No history of ingrown nail in the past   Is diabetic  Lab Results  Component Value Date   HGBA1C 6.2 10/30/2021     She gets nails polished but does not let nail tech do soaks or use instruments      Lab Results  Component Value Date   CREATININE 0.97 10/30/2021   BUN 13 10/30/2021   NA 141 10/30/2021   K 4.3 10/30/2021   CL 104 10/30/2021   CO2 30 10/30/2021   Patient Active Problem List   Diagnosis Date Noted   Cellulitis, toe 11/18/2021   Moderate episode of recurrent major depressive disorder (HCC) 08/25/2021   Acute pharyngitis 05/28/2021   Palpitations 03/11/2021   Muscle cramps    Diabetes mellitus type II, controlled (HCC)    Controlled type 2 diabetes mellitus with complication, without long-term current use of insulin (HCC)    Vascular headache    Recurrent strokes (HCC)    Expressive aphasia    History of CVA (cerebrovascular accident) 01/02/2021   Dizziness 12/18/2020   Tension headache 11/18/2020   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   Splinter in skin, left foot 12/11/2019   Allergic rhinitis due to pollen 10/15/2019   Concentration deficit 08/14/2019   Non-cardiac chest pain 05/18/2019   Left shoulder pain 08/02/2018   Restless leg syndrome 12/29/2016   GERD (gastroesophageal reflux disease) 08/24/2016   Hemorrhoids  08/24/2016   Healthcare maintenance 08/24/2016   GAD (generalized anxiety disorder) 06/22/2016   Levoscoliosis 10/06/2015   Type 2 diabetes mellitus (HCC) 09/08/2015   Fatigue 04/03/2015   History of stroke 03/14/2015   Precordial pain 09/29/2014   IBS (irritable bowel syndrome) 09/29/2014   Hyperlipidemia associated with type 2 diabetes mellitus (HCC) 09/29/2014   Essential hypertension 09/29/2014   Past Medical History:  Diagnosis Date   Anxiety    self reported   Asthma    Depression    controlled   Diabetes mellitus without complication (HCC)    Hyperlipidemia    controlled with medication   IBS (irritable bowel syndrome)    Slurred speech    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   Stroke (HCC) 03/13/2015   Tension headache 11/18/2020   Word finding difficulty 02/27/2018   Past Surgical History:  Procedure Laterality Date   ABDOMINAL HYSTERECTOMY  10/2006   bowel reconstruction  10/2006   with hysterectomy   CESAREAN SECTION  2005   EP IMPLANTABLE DEVICE N/A 03/17/2015   Procedure: Loop Recorder Insertion;  Surgeon: Will Jorja Loa, MD;  Location: [facility]ASIVE [facility];  Service: Cardiovascular;  Laterality: N/A;   TEE WITHOUT CARDIOVERSION N/A 03/17/2015  Procedure: TRANSESOPHAGEAL ECHOCARDIOGRAM (TEE);  Surgeon: Chilton Si, MD;  Location: [facility] ENDOSCOPY;  Service: Cardiovascular;  Laterality: N/A;   TEE WITHOUT CARDIOVERSION N/A 01/06/2021   Procedure: TRANSESOPHAGEAL ECHOCARDIOGRAM (TEE);  Surgeon: [name], MD;  Location: [facility] ORS;  Service: Cardiovascular;  Laterality: N/A;   TOE SURGERY     Left 2nd metatarsal   Social History   Tobacco Use   Smoking status: Never   Smokeless tobacco: Never  Vaping Use   Vaping Use: Never used  Substance Use Topics   Alcohol use: Not Currently    Alcohol/week: 7.0 standard drinks of alcohol    Types: 7 Glasses of wine per week    Comment: red wine occasionally but not since stroke   Drug use: Not Currently     Types: Marijuana    Comment: edibles-twice weekly   Family History  Problem Relation Age of Onset   Hyperlipidemia Mother    Arthritis Father    Diabetes Father    Hypertension Father    Hyperlipidemia Father    Prostate cancer Father    Prostate cancer Maternal Uncle        x 3   Heart attack Maternal Grandfather    Hyperlipidemia Maternal Grandfather    Prostate cancer Maternal Grandfather    Diabetes Paternal Grandmother    Stroke Paternal Grandmother    Diabetes Paternal Grandfather    Heart attack Paternal Grandfather    Colon cancer Neg Hx    Colon polyps Neg Hx    Stomach cancer Neg Hx    Esophageal cancer Neg Hx    Pancreatic cancer Neg Hx    Allergies  Allergen Reactions   Cephalexin Hives and Rash    Can take Augmentin   Current Outpatient Medications on File Prior to Visit  Medication Sig Dispense Refill   acetaminophen (TYLENOL) 325 MG tablet Take 1-2 tablets (325-650 mg total) by mouth every 4 (four) hours as needed for mild pain.     albuterol (VENTOLIN HFA) 108 (90 Base) MCG/ACT inhaler Inhale 2 puffs into the lungs every 4 (four) hours as needed for wheezing or shortness of breath (cough, shortness of breath or wheezing.). 1 each 1   Ascorbic Acid (VITAMIN C) 1000 MG tablet Take 1 tablet (1,000 mg total) by mouth daily. 30 tablet 0   azelastine (ASTELIN) 0.1 % nasal spray Place 1 spray into both nostrils 2 (two) times daily. Use in each nostril as directed 30 mL 0   cholecalciferol (VITAMIN D3) 25 MCG (1000 UNIT) tablet Take 1 tablet (1,000 Units total) by mouth daily. 30 tablet 0   clopidogrel (PLAVIX) 75 MG tablet Take 1 tablet by mouth once daily 90 tablet 1   Continuous Blood Gluc Receiver (FREESTYLE LIBRE 2 READER) DEVI Use to check blood sugars. 1 each 0   Continuous Blood Gluc Sensor (FREESTYLE LIBRE 2 SENSOR) MISC Apply every 14 days to check blood sugars. 6 each 1   ezetimibe (ZETIA) 10 MG tablet TAKE 1 TABLET BY MOUTH ONCE DAILY FOR CHOLESTEROL 90  tablet 2   famotidine (PEPCID) 20 MG tablet Take 1 tablet (20 mg total) by mouth 2 (two) times daily. For heartburn. 60 tablet 0   hydrochlorothiazide (HYDRODIURIL) 12.5 MG tablet Take 1 tablet by mouth once daily for blood pressure 90 tablet 3   metFORMIN (GLUCOPHAGE-XR) 500 MG 24 hr tablet TAKE 1 TABLET BY MOUTH ONCE DAILY WITH BREAKFAST FOR DIABETES 90 tablet 1   metoprolol succinate (TOPROL XL) 25 MG 24 hr  tablet Take 1 tablet (25 mg total) by mouth daily. 30 tablet 1   Multiple Vitamins-Minerals (MULTIVITAMIN WITH MINERALS) tablet Take 1 tablet by mouth daily.     pantoprazole (PROTONIX) 40 MG tablet Take 1 tablet (40 mg total) by mouth daily. 30 tablet 3   rosuvastatin (CRESTOR) 40 MG tablet Take 1 tablet (40 mg total) by mouth daily. For cholesterol. 90 tablet 3   sertraline (ZOLOFT) 50 MG tablet Take 1 tablet (50 mg total) by mouth daily. for anxiety and depression. (Patient taking differently: Take 150 mg by mouth daily. for anxiety and depression.) 90 tablet 1   topiramate (TOPAMAX) 25 MG tablet Take 1 tablet (25 mg total) by mouth daily. For headache prevention. 90 tablet 3   No current facility-administered medications on file prior to visit.    Review of Systems  Constitutional:  Negative for activity change, appetite change, fatigue, fever and unexpected weight change.  HENT:  Negative for congestion, ear pain, rhinorrhea, sinus pressure and sore throat.   Eyes:  Negative for pain, redness and visual disturbance.  Respiratory:  Negative for cough, shortness of breath and wheezing.   Cardiovascular:  Negative for chest pain and palpitations.  Gastrointestinal:  Negative for abdominal pain, blood in stool, constipation and diarrhea.  Endocrine: Negative for polydipsia and polyuria.  Genitourinary:  Negative for dysuria, frequency and urgency.  Musculoskeletal:  Negative for arthralgias, back pain and myalgias.  Skin:  Negative for pallor and rash.       Peeling skin L great toe    Allergic/Immunologic: Negative for environmental allergies.  Neurological:  Negative for dizziness, syncope and headaches.  Hematological:  Negative for adenopathy. Does not bruise/bleed easily.  Psychiatric/Behavioral:  Negative for decreased concentration and dysphoric mood. The patient is not nervous/anxious.        Objective:   Physical Exam Constitutional:      General: She is not in acute distress.    Appearance: Normal appearance. She is obese. She is not ill-appearing or diaphoretic.  Eyes:     Conjunctiva/sclera: Conjunctivae normal.     Pupils: Pupils are equal, round, and reactive to light.  Cardiovascular:     Rate and Rhythm: Normal rate and regular rhythm.  Musculoskeletal:     Comments: No LE joint swelling or bony tenderness  Skin:    General: Skin is warm and dry.     Findings: Erythema present. No bruising or rash.     Comments: Skin on L medial great toe is peeling with some mild erythema and tenderness Primarily where shorter 2nd toe lays over it /resembles trauma  No pus or drainage or fluctuance L great toe nail is white and slt thicker Not obv ingrown  Neurological:     Mental Status: She is alert.     Sensory: No sensory deficit.  Psychiatric:        Mood and Affect: Mood normal.           Assessment & Plan:   Problem List Items Addressed This Visit       Other   Cellulitis, toe    Medial L great toe in area where shorter 2nd toe lies over it  Redness/peeling  Poss nail change  Of note-past trauma as well may have affected nail bed  Px augmentin (tolerates this)  Soaks/soap/water Vaseline/aquaphor  Ref to podiatry- may benefit from something to separate those toes and to inspect nail in setting of DM Update if not starting to improve in a week  or if worsening        Relevant Orders   Ambulatory referral to Podiatry

## 2021-11-18 NOTE — Assessment & Plan Note (Signed)
Medial L great toe in area where shorter 2nd toe lies over it  Redness/peeling  Poss nail change  Of note-past trauma as well may have affected nail bed  Px augmentin (tolerates this)  Soaks/soap/water Vaseline/aquaphor  Ref to podiatry- may benefit from something to separate those toes and to inspect nail in setting of DM Update if not starting to improve in a week or if worsening

## 2021-11-18 NOTE — Patient Instructions (Addendum)
Soak your foot in soapy water for 5-10 minutes  Keep it very clean  Use some aquaphor or vaseline on the dry skin   Elevate when you can   Take the augmentin as directed   If symptoms worsen let us know   I will place a referral to podiatry  You will get a call  If you don't hear in a week please let us know

## 2021-11-23 ENCOUNTER — Encounter: Payer: Self-pay | Admitting: Physician Assistant

## 2021-11-23 ENCOUNTER — Ambulatory Visit (INDEPENDENT_AMBULATORY_CARE_PROVIDER_SITE_OTHER): Payer: BC Managed Care – PPO | Admitting: Physician Assistant

## 2021-11-23 VITALS — BP 108/72 | HR 76 | Ht 62.0 in | Wt 171.4 lb

## 2021-11-23 DIAGNOSIS — R002 Palpitations: Secondary | ICD-10-CM

## 2021-11-23 DIAGNOSIS — R079 Chest pain, unspecified: Secondary | ICD-10-CM | POA: Diagnosis not present

## 2021-11-23 DIAGNOSIS — R7989 Other specified abnormal findings of blood chemistry: Secondary | ICD-10-CM

## 2021-11-23 DIAGNOSIS — E1169 Type 2 diabetes mellitus with other specified complication: Secondary | ICD-10-CM | POA: Diagnosis not present

## 2021-11-23 DIAGNOSIS — E785 Hyperlipidemia, unspecified: Secondary | ICD-10-CM

## 2021-11-23 DIAGNOSIS — I639 Cerebral infarction, unspecified: Secondary | ICD-10-CM

## 2021-12-07 ENCOUNTER — Ambulatory Visit (INDEPENDENT_AMBULATORY_CARE_PROVIDER_SITE_OTHER): Payer: BC Managed Care – PPO | Admitting: Podiatry

## 2021-12-07 ENCOUNTER — Encounter: Payer: Self-pay | Admitting: Podiatry

## 2021-12-07 DIAGNOSIS — B351 Tinea unguium: Secondary | ICD-10-CM

## 2021-12-07 DIAGNOSIS — L03032 Cellulitis of left toe: Secondary | ICD-10-CM | POA: Diagnosis not present

## 2021-12-07 MED ORDER — CICLOPIROX 8 % EX SOLN: Freq: Every day | CUTANEOUS | 3 refills | Status: DC

## 2021-12-07 NOTE — Progress Notes (Signed)
  Subjective:  Patient ID: [name], female    DOB: 07-30-1970,  MRN: [id number]  Chief Complaint  Patient presents with   cellulitis      (np) Cellulitis of toe of right foot    51 y.o. female presents with the above complaint. History confirmed with patient.  A couple weeks ago she developed swelling and redness on the outside edge of the left great toenail, her PCP put her on antibiotics and it has gotten better.  She has discoloration of the toenails  Objective:  Physical Exam: warm, good capillary refill, no trophic changes or ulcerative lesions, normal DP and PT pulses, normal sensory exam, onychomycosis, and resolved paronychia left great toe lateral border, no active ingrown.  Assessment:   1. Onychomycosis   2. Paronychia of great toe, left      Plan:  Patient was evaluated and treated and all questions answered.  Paronychia appears to have resolved with antibiotics, no active ingrown to the nail border was present today.  I discussed with her if it recurs then permanent partial matricectomy would be recommended  She also discoloration of multiple toenails and these were debrided in length and thickness today using a sharp nail nipper.  I recommended topical treatment as she had superficial white onychomycosis.  A prescribed her ciclopirox and discussed its use administration with her.  She will see me back as needed if it does not improve.  Return if symptoms worsen or fail to improve.

## 2021-12-09 ENCOUNTER — Ambulatory Visit (INDEPENDENT_AMBULATORY_CARE_PROVIDER_SITE_OTHER): Payer: BC Managed Care – PPO | Admitting: Adult Health

## 2021-12-09 ENCOUNTER — Telehealth: Payer: Self-pay | Admitting: Internal Medicine

## 2021-12-09 ENCOUNTER — Encounter: Payer: Self-pay | Admitting: Adult Health

## 2021-12-09 VITALS — BP 129/92 | HR 78

## 2021-12-09 DIAGNOSIS — I639 Cerebral infarction, unspecified: Secondary | ICD-10-CM | POA: Diagnosis not present

## 2021-12-09 DIAGNOSIS — F4321 Adjustment disorder with depressed mood: Secondary | ICD-10-CM | POA: Diagnosis not present

## 2021-12-09 NOTE — Patient Instructions (Addendum)
Continue clopidogrel 75 mg daily  and Zetia  for secondary stroke prevention  Continue to follow with psychology/psychiatry as scheduled  Restart Crestor if needed down the road after further work up with liver concerns   Continue to follow up with PCP regarding cholesterol and blood pressure management  Maintain strict control of hypertension with blood pressure goal below 130/90 and cholesterol with LDL cholesterol (bad cholesterol) goal below 70 mg/dL.   Signs of a Stroke? Follow the BEFAST method:  Balance Watch for a sudden loss of balance, trouble with coordination or vertigo Eyes Is there a sudden loss of vision in one or both eyes? Or double vision?  Face: Ask the person to smile. Does one side of the face droop or is it numb?  Arms: Ask the person to raise both arms. Does one arm drift downward? Is there weakness or numbness of a leg? Speech: Ask the person to repeat a simple phrase. Does the speech sound slurred/strange? Is the person confused ? Time: If you observe any of these signs, call 911.        Thank you for coming to see [place] at [facility]. I hope we have been able to provide you high quality care today.  You may receive a patient satisfaction survey over the next few weeks. We would appreciate your feedback and comments so that we may continue to improve ourselves and the health of our patients.

## 2021-12-09 NOTE — Telephone Encounter (Signed)
Pt calling to f/u on EKG results. She would like a callback once results are in. Please advise

## 2021-12-09 NOTE — Telephone Encounter (Signed)
I called and spoke with the patient.  I confirmed with her that she was calling to inquire about the results of her in office EKG from her appointment with Eula Listen, PA on 11/23/21. Per the patient, this is correct;  Per Ryan's OV note from that day- EKG showed: EKG:  EKG is ordered today.  The EKG ordered today demonstrates NSR, 76 bpm, no acute ST-T changes.  The patient has been made aware of the above results and voices understanding.  She also wanted to clarify her follow up appointment. I have advised her she is due: - LFT in one month (12/24/21) over at [facility]  - 02/24/22 @ 3:35 pm with Alycia Rossetti, PA  The patient voices understanding of both of these appointments and is agreeable.  She was very appreciative of the call back.

## 2021-12-09 NOTE — Progress Notes (Signed)
[facility] [address] [place] [address] [phone]       OFFICE FOLLOW UP NOTE  Amber [name] Date of Birth:  10-28-1970 Medical Record Number:  [id number]   Referring MD: Alma Friendly, NP  Reason for Referral: Stroke  Chief Complaint  Patient presents with   Follow-up    Pt reports feeling okay. She states she recently lost her brother in February and is seeing a psychartrist for counseling and depression. She states she has been having headaches and been taking topiramate. She reports her headaches are lasting a few hours and having them every other day. Room 2 Alone      HPI:   Update 12/09/2021 JM: patient returns for 6 month stroke follow up unaccompanied  She has been doing well from a stroke standpoint with only occasional aphasia but more so jumbled up words trying to speak too quickly.  Reports only minimal weakness on the left side but does not interfere with daily activity or functioning.  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  Unfortunately, she has been dealing with great stress in setting of her brother's death unexpectedly after a massive heart attack.  She is currently on short-term leave and plans on returning back to work at the end of this month.  She has been dealing with significant depression as well as insomnia, headaches and concentration difficulties.  She is currently being seen by behavioral health.  Currently taking sertraline 150 mg daily and topiramate 25 mg twice daily.  Denies new stroke/TIA symptoms. Compliant on plavix and zetia, denies side effects.  Previously on Crestor but this is currently on hold in setting of recent evidence of liver lesion, plans on repeating abdominal ultrasound next month and possibly restarting if able. Bood pressure today 149/92. Routinely follows with PCP and cardiology.   No further concerns at this time     History provided  for reference purposes only Update 06/10/2021, JM: Ms Amber [name] is here today for a stroke follow-up accompanied by her daughter. Reports improving aphasia, left-sided weakness, and auditory comprehension deficits; denies new stroke/TIA symptoms. Reports some depression and anxiety (referred to psychiatry by [facility] neurology) and a desire to return to part-time work as a Manufacturing engineer by February. She resumed driving around Thanksgiving and has been driving up to 93ATF but only locally. Remains on plavix, zetia, and crestor without side effects. BP today 110/72. Labs A1c 6.2 (01/02/21), LDL 76 (03/03/21). She will see her PCP Tuesday and her Cardiologist 1/23. Eval by [facility] neurology for second opinion on recurrent strokes - no further work up recommended as all since completed which reassured her.  No further concerns at this time  Initial visit 02/16/2021, Dr [name]: Amber [name] is a 51 year old woman with a past medical history significant for multiple remote cerebellar strokes, hyperlipidemia, anxiety, depression, asthma, tension headache who presented to the emergency department with acute onset expressive aphasia last known well was 1115 on 01/02/2021.  Her daughter noticed that she was not making sense when she spoke therefore she called 911.  On Dr. Livia Snellen examination patient was able to follow commands however was unable to express any meaningful speech.  Her words were jumbled together in a word salad.  She did not have any other significant deficits.  NIH stroke scale was a 4 (2 for questions and 2 for aphasia). Head CT showed NAICP. Patient not on anticoagulation.  After discussion of risk benefits she was given IV  tPA uneventfully and showed improvement.  She had some transient worsening of her symptoms and CT scan of the head was repeated which showed no hemorrhagic conversion.  MRI scan of the brain personally reviewed showed acute infarction involving left middle frontal gyrus and left parietal.  A1c 6.2.  I  discussed with milligrams percent.  CT angiogram of brain and neck did not show significant large vessel stenosis or occlusion transthoracic echo showed normal ejection fraction without intracardiac clot or PFO.  TEE also did not show PFO, clot or vegetation or cardiac source of embolism.  ESR was 5 mm.  HIV was negative.  Homocystine was normal anticardiolipin antibodies were negative.  ANA was negative.  Urine drug screen was negative.  Patient had previously had a stroke in October 2016 also of cryptogenic etiology involving posterior left frontal cortical and subcortical white matter as well as MRI at that time and also shown a previous small remote right parietal cortical infarct.  She had a loop recorder inserted which did not show any paroxysmal A. fib and its battery ran out and is currently nonfunctional.  Patient had been on aspirin and during the current admission it was switched to Plavix which is tolerating well without bruising or bleeding.  She states her speech is almost back to normal though when she is excited or exhausted she occasionally struggles for words.  She still has some residual left-sided weakness and stiffness from a previous stroke.  ROS:   14 system review of systems is positive for those listed in HPI and all other systems negative  PMH:  Past Medical History:  Diagnosis Date   Anxiety    self reported   Asthma    Depression    controlled   Diabetes mellitus without complication ([facility])    Hyperlipidemia    controlled with medication   IBS (irritable bowel syndrome)    Slurred speech    Stroke (cerebrum) ([facility]) 01/07/2021   Stroke ([facility]) 03/13/2015   Tension headache 11/18/2020   Word finding difficulty 02/27/2018    Social History:  Social History   Socioeconomic History   Marital status: Single    Spouse name: Not on file   Number of children: Not on file   Years of education: Not on file   Highest education level: Not on file  Occupational History   Not  on file  Tobacco Use   Smoking status: Never   Smokeless tobacco: Never  Vaping Use   Vaping Use: Never used  Substance and Sexual Activity   Alcohol use: Not Currently    Alcohol/week: 7.0 standard drinks of alcohol    Types: 7 Glasses of wine per week    Comment: red wine occasionally but not since stroke   Drug use: Not Currently    Types: Marijuana    Comment: edibles-twice weekly   Sexual activity: Not Currently  Other Topics Concern   Not on file  Social History Narrative   Originally from [place] lives up here in [place]   Has one daughter.   Enjoys spending time shopping and spending time with family    Social Determinants of Health   Financial Resource Strain: Not on file  Food Insecurity: Not on file  Transportation Needs: Not on file  Physical Activity: Not on file  Stress: Not on file  Social Connections: Not on file  Intimate Partner Violence: Not on file    Medications:   Current Outpatient Medications on File  Prior to Visit  Medication Sig Dispense Refill   acetaminophen (TYLENOL) 325 MG tablet Take 1-2 tablets (325-650 mg total) by mouth every 4 (four) hours as needed for mild pain.     albuterol (VENTOLIN HFA) 108 (90 Base) MCG/ACT inhaler Inhale 2 puffs into the lungs every 4 (four) hours as needed for wheezing or shortness of breath (cough, shortness of breath or wheezing.). 1 each 1   Ascorbic Acid (VITAMIN C) 1000 MG tablet Take 1 tablet (1,000 mg total) by mouth daily. 30 tablet 0   cholecalciferol (VITAMIN D3) 25 MCG (1000 UNIT) tablet Take 1 tablet (1,000 Units total) by mouth daily. 30 tablet 0   ciclopirox (PENLAC) 8 % solution Apply topically at bedtime. Apply over nail and surrounding skin. Apply daily over previous coat. After seven (7) days, may remove with alcohol and continue cycle. 6.6 mL 3   clopidogrel (PLAVIX) 75 MG tablet Take 1 tablet by mouth once daily 90 tablet 1   ezetimibe (ZETIA) 10 MG tablet TAKE 1 TABLET  BY MOUTH ONCE DAILY FOR CHOLESTEROL 90 tablet 2   famotidine (PEPCID) 20 MG tablet Take 1 tablet (20 mg total) by mouth 2 (two) times daily. For heartburn. 60 tablet 0   hydrochlorothiazide (HYDRODIURIL) 12.5 MG tablet Take 1 tablet by mouth once daily for blood pressure 90 tablet 3   metFORMIN (GLUCOPHAGE-XR) 500 MG 24 hr tablet TAKE 1 TABLET BY MOUTH ONCE DAILY WITH BREAKFAST FOR DIABETES 90 tablet 1   metoprolol succinate (TOPROL XL) 25 MG 24 hr tablet Take 1 tablet (25 mg total) by mouth daily. 30 tablet 1   Multiple Vitamins-Minerals (MULTIVITAMIN WITH MINERALS) tablet Take 1 tablet by mouth daily.     pantoprazole (PROTONIX) 40 MG tablet Take 1 tablet (40 mg total) by mouth daily. 30 tablet 3   sertraline (ZOLOFT) 50 MG tablet Take 1 tablet (50 mg total) by mouth daily. for anxiety and depression. (Patient taking differently: Take 150 mg by mouth daily. for anxiety and depression.) 90 tablet 1   topiramate (TOPAMAX) 25 MG tablet Take 1 tablet (25 mg total) by mouth daily. For headache prevention. 90 tablet 3   amoxicillin-clavulanate (AUGMENTIN) 875-125 MG tablet Take 1 tablet by mouth 2 (two) times daily. (Patient not taking: Reported on 12/09/2021) 14 tablet 0   azelastine (ASTELIN) 0.1 % nasal spray Place 1 spray into both nostrils 2 (two) times daily. Use in each nostril as directed (Patient not taking: Reported on 12/09/2021) 30 mL 0   Continuous Blood Gluc Receiver (FREESTYLE LIBRE 2 READER) DEVI Use to check blood sugars. (Patient not taking: Reported on 12/09/2021) 1 each 0   Continuous Blood Gluc Sensor (FREESTYLE LIBRE 2 SENSOR) MISC Apply every 14 days to check blood sugars. (Patient not taking: Reported on 12/09/2021) 6 each 1   rosuvastatin (CRESTOR) 40 MG tablet Take 1 tablet (40 mg total) by mouth daily. For cholesterol. (Patient not taking: Reported on 12/09/2021) 90 tablet 3   No current facility-administered medications on file prior to visit.    Allergies:   Allergies   Allergen Reactions   Cephalexin Hives and Rash    Can take Augmentin    Physical Exam Today's Vitals   12/09/21 1412  BP: (!) 129/92  Pulse: 78    There is no height or weight on file to calculate BMI.  General: well developed, well nourished middle-aged African-American lady, seated, in no evident distress Head: head normocephalic and atraumatic.   Neck: supple with no carotid  or supraclavicular bruits Cardiovascular: regular rate and rhythm, no murmurs Musculoskeletal: no deformity Skin:  no rash/petichiae Vascular:  Normal pulses all extremities  Neurologic Exam Mental Status: Awake and fully alert. Oriented to place and time. Recent and remote memory intact. Attention span, concentration and fund of knowledge appropriate. Mood and affect appropriate. Unable to appreciate aphasia or dysarthria during conversation. Good comprehension, naming and repetition. Cranial Nerves: Pupils equal, briskly reactive to light. Extraocular movements full without nystagmus. Visual fields full to confrontation. Hearing intact. Facial sensation intact. Face, tongue, palate moves normally and symmetrically.  Motor: Normal bulk and tone. Normal strength in all tested extremity muscles except diminished fine finger movements on the left and mild left grip weakness.  Orbits right over left upper extremity.  Tone is increased in the left leg.. Sensory.: intact to touch , pinprick , position and vibratory sensation.  Coordination: Rapid alternating movements normal in all extremities except left hand. Finger-to-nose and heel-to-shin performed accurately right side with some incoordination of left side. Gait and Station: Arises from chair without difficulty. Stance is normal. Gait demonstrates normal stride length although slightly decreased LLE step height without use of AD.  Tandem walking heel toe not attempted Reflexes: 1+ and symmetric. Toes downgoing.        ASSESSMENT: 51 year old  African-American lady with recurrent cryptogenic left MCA branch infarct in August 2022 with residual mild expressive aphasia.  Remote history of L MCA cryptogenic stroke in October 2016.  Vascular risk factors of diabetes, hypertension, hyperlipidemia.       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  Crestor currently on hold due to liver lesion - defer restart to PCP Close follow up by PCP for secondary risk factors including BP goal<130/90, and HLD with LDL goal<70  Stroke labs: A1c 6.2 (10/2021), LDL 57 (05/2021) Sleep study negative for sleep apnea 08/2017 Hypercoagulable panel negative ILR 03/2015-07/2018 end of life - no evidence of afib  TEE negative Situational depression: Continue to follow with behavioral health.  Reassured insomnia, headaches, and concentration difficulties likely in setting of increased stressors. Advised to further discuss these concerns with PCP/BH if they do not improve   Doing well from stroke standpoint without further recommendations and risk factors are managed by PCP. She may follow up PRN, as usual for our patients who are strictly being followed for stroke. If any new neurological issues should arise, request PCP place referral for evaluation by one of our neurologists. Thank you.     CC:  CPleas Koch NP   I spent 39 minutes of face-to-face and non-face-to-face time with patient.  This included previsit chart review, lab review, study review, electronic health record documentation, patient education and discussion regarding recurrent left MCA strokes and residual deficits, secondary stroke prevention measures and aggressive stroke risk factor management, prolonged discussion regarding  current situational stressors/depression and answered all other questions to patient satisfaction  JFrann Rider AGNP-BC  [facility] [address] [address] Phone [phone] [phone]: This document was prepared with digital dictation and possible smart phrase technology. Any transcriptional errors that result from this process are unintentional.

## 2021-12-15 ENCOUNTER — Encounter: Payer: Self-pay | Admitting: Physical Medicine & Rehabilitation

## 2021-12-15 ENCOUNTER — Encounter
Payer: BC Managed Care – PPO | Attending: Physical Medicine & Rehabilitation | Admitting: Physical Medicine & Rehabilitation

## 2021-12-15 VITALS — BP 121/87 | HR 86 | Ht 62.0 in | Wt 169.0 lb

## 2021-12-15 DIAGNOSIS — Z8673 Personal history of transient ischemic attack (TIA), and cerebral infarction without residual deficits: Secondary | ICD-10-CM

## 2021-12-15 NOTE — Patient Instructions (Signed)
Please work up to walking 30 min per day  Also do heel to toe walking for balance
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
## 2021-12-16 ENCOUNTER — Encounter: Payer: Self-pay | Admitting: Primary Care

## 2021-12-16 ENCOUNTER — Telehealth (INDEPENDENT_AMBULATORY_CARE_PROVIDER_SITE_OTHER): Payer: BC Managed Care – PPO | Admitting: Primary Care

## 2021-12-16 DIAGNOSIS — F331 Major depressive disorder, recurrent, moderate: Secondary | ICD-10-CM | POA: Diagnosis not present

## 2021-12-16 DIAGNOSIS — F411 Generalized anxiety disorder: Secondary | ICD-10-CM

## 2021-12-16 NOTE — Assessment & Plan Note (Signed)
Improved compared to last visit!  Commended her on regular follow-up with psychiatry and therapy. Continue Zoloft 150 mg daily, start Pristiq 50 mg daily as prescribed.  Agree to extend short-term disability leave through July 31 with a return date of August 1.  We will continue to monitor her symptoms, she will follow-up later this fall.

## 2021-12-16 NOTE — Assessment & Plan Note (Signed)
Improved compared to last visit!  Commended her on regular follow-up with psychiatry and therapy. Continue Zoloft 150 mg daily, start Pristiq 50 mg daily as prescribed.  Agree to extend short-term disability leave through July 31 with a return date of August 1.  We will continue to monitor her symptoms, she will follow-up later this fall. 

## 2021-12-16 NOTE — Patient Instructions (Signed)
We will extend your leave through July 31 with a return date of August 1.  Continue to follow with your psychiatrist and therapist.  Please schedule follow-up visit with me for November or December 2023.  It was a pleasure to see you today!

## 2021-12-16 NOTE — Progress Notes (Signed)
Patient ID: Amber [name], female    DOB: 17-Mar-1971, 52 y.o.   MRN: [id number]  Virtual visit completed through [employer], a video enabled telemedicine application. Due to national recommendations of social distancing due to COVID-19, a virtual visit is felt to be most appropriate for this patient at this time. Reviewed limitations, risks, security and privacy concerns of performing a virtual visit and the availability of in person appointments. I also reviewed that there may be a patient responsible charge related to this service. The patient agreed to proceed.   Patient location: home Provider location: [place] [facility], office Persons participating in this virtual visit: patient, provider   If any vitals were documented, they were collected by patient at home unless specified below.    Ht 5\' 2"  (1.575 m)   Wt 169 lb (76.7 kg)   BMI 30.91 kg/m    CC: Follow up for short term disability Subjective:   HPI: Amber [name] is a 51 y.o. female with a history of recurrent CVA, hypertension, hyperlipidemia, type 2 diabetes, GAD, MDD presenting on 12/16/2021 for follow up from short term disability.  She was last evaluated on 10/30/21 to discuss short term disability leave from work. She had returned to work full time in April 2023. Between April and June 2023 she had been experiencing increased family stress so she requested leave from November 05, 2021 through December 20, 2021.   Since her last visit she's been doing better in terms of her anxiety and depression. She's been following with her psychiatrist and therapist regularly, feels that this has helped with her symptoms. Last week she had a set back in her depression and grief, became tearful for no reason. She continues to experience restlessness while sleeping, mind racing thoughts when in bed.   She is currently managed on Zoloft 150 mg daily and will be starting Pristiq 50 mg soon. She has been released from neurology and physical  medicine.   She is scheduled to return to work full time on December 21, 2021. She doesn't feel ready to return to work next week, would like another week to prepare for her return to work full time. She is requesting to extend her leave through July 31st with a return to work date of August 1st.         Relevant past medical, surgical, family and social history reviewed and updated as indicated. Interim medical history since our last visit reviewed. Allergies and medications reviewed and updated. Outpatient Medications Prior to Visit  Medication Sig Dispense Refill   acetaminophen (TYLENOL) 325 MG tablet Take 1-2 tablets (325-650 mg total) by mouth every 4 (four) hours as needed for mild pain.     albuterol (VENTOLIN HFA) 108 (90 Base) MCG/ACT inhaler Inhale 2 puffs into the lungs every 4 (four) hours as needed for wheezing or shortness of breath (cough, shortness of breath or wheezing.). 1 each 1   Ascorbic Acid (VITAMIN C) 1000 MG tablet Take 1 tablet (1,000 mg total) by mouth daily. 30 tablet 0   cholecalciferol (VITAMIN D3) 25 MCG (1000 UNIT) tablet Take 1 tablet (1,000 Units total) by mouth daily. 30 tablet 0   ciclopirox (PENLAC) 8 % solution Apply topically at bedtime. Apply over nail and surrounding skin. Apply daily over previous coat. After seven (7) days, may remove with alcohol and continue cycle. 6.6 mL 3   clopidogrel (PLAVIX) 75 MG tablet Take 1 tablet by mouth once daily 90 tablet 1   Continuous  Blood Gluc Receiver (FREESTYLE LIBRE 2 READER) DEVI Use to check blood sugars. 1 each 0   Continuous Blood Gluc Sensor (FREESTYLE LIBRE 2 SENSOR) MISC Apply every 14 days to check blood sugars. 6 each 1   ezetimibe (ZETIA) 10 MG tablet TAKE 1 TABLET BY MOUTH ONCE DAILY FOR CHOLESTEROL 90 tablet 2   famotidine (PEPCID) 20 MG tablet Take 1 tablet (20 mg total) by mouth 2 (two) times daily. For heartburn. 60 tablet 0   hydrochlorothiazide (HYDRODIURIL) 12.5 MG tablet Take 1 tablet by mouth once  daily for blood pressure 90 tablet 3   metFORMIN (GLUCOPHAGE-XR) 500 MG 24 hr tablet TAKE 1 TABLET BY MOUTH ONCE DAILY WITH BREAKFAST FOR DIABETES 90 tablet 1   metoprolol succinate (TOPROL XL) 25 MG 24 hr tablet Take 1 tablet (25 mg total) by mouth daily. 30 tablet 1   Multiple Vitamins-Minerals (MULTIVITAMIN WITH MINERALS) tablet Take 1 tablet by mouth daily.     pantoprazole (PROTONIX) 40 MG tablet Take 1 tablet (40 mg total) by mouth daily. 30 tablet 3   sertraline (ZOLOFT) 50 MG tablet Take 1 tablet (50 mg total) by mouth daily. for anxiety and depression. (Patient taking differently: Take 150 mg by mouth daily. for anxiety and depression.) 90 tablet 1   topiramate (TOPAMAX) 25 MG tablet Take 1 tablet (25 mg total) by mouth daily. For headache prevention. 90 tablet 3   azelastine (ASTELIN) 0.1 % nasal spray Place 1 spray into both nostrils 2 (two) times daily. Use in each nostril as directed (Patient not taking: Reported on 12/15/2021) 30 mL 0   desvenlafaxine (PRISTIQ) 50 MG 24 hr tablet Take 50 mg by mouth daily.     rosuvastatin (CRESTOR) 40 MG tablet Take 1 tablet (40 mg total) by mouth daily. For cholesterol. (Patient not taking: Reported on 12/16/2021) 90 tablet 3   No facility-administered medications prior to visit.     Per HPI unless specifically indicated in ROS section below Review of Systems  Respiratory:  Negative for shortness of breath.   Cardiovascular:  Negative for chest pain.  Psychiatric/Behavioral:  Positive for sleep disturbance. The patient is nervous/anxious.        See HPI   Objective:  Ht 5\' 2"  (1.575 m)   Wt 169 lb (76.7 kg)   BMI 30.91 kg/m   Wt Readings from Last 3 Encounters:  12/16/21 169 lb (76.7 kg)  12/15/21 169 lb (76.7 kg)  11/23/21 171 lb 6 oz (77.7 kg)       Physical exam: General: Alert and oriented x 3, no distress, does not appear sickly  Pulmonary: Speaks in complete sentences without increased work of breathing, no cough during  visit.  Psychiatric: Normal mood, thought content, and behavior.     Results for orders placed or performed in visit on 10/30/21  Microalbumin / creatinine urine ratio  Result Value Ref Range   Microalb, Ur <0.7 0.0 - 1.9 mg/dL   Creatinine,U 12/30/21 mg/dL   Microalb Creat Ratio 0.9 0.0 - 30.0 mg/g  Hemoglobin A1c  Result Value Ref Range   Hgb A1c MFr Bld 6.2 4.6 - 6.5 %  Comprehensive metabolic panel  Result Value Ref Range   Sodium 141 135 - 145 mEq/L   Potassium 4.3 3.5 - 5.1 mEq/L   Chloride 104 96 - 112 mEq/L   CO2 30 19 - 32 mEq/L   Glucose, Bld 92 70 - 99 mg/dL   BUN 13 6 - 23 mg/dL   Creatinine,  Ser 0.97 0.40 - 1.20 mg/dL   Total Bilirubin 0.5 0.2 - 1.2 mg/dL   Alkaline Phosphatase 66 39 - 117 U/L   AST 50 (H) 0 - 37 U/L   ALT 106 (H) 0 - 35 U/L   Total Protein 7.9 6.0 - 8.3 g/dL   Albumin 4.3 3.5 - 5.2 g/dL   GFR 94.85 >46.27 mL/min   Calcium 10.0 8.4 - 10.5 mg/dL  Hepatitis C antibody  Result Value Ref Range   Hepatitis C Ab NON-REACTIVE NON-REACTIVE   SIGNAL TO CUT-OFF 0.07 <1.00   Assessment & Plan:   Problem List Items Addressed This Visit       Other   GAD (generalized anxiety disorder)    Improved compared to last visit!  Commended her on regular follow-up with psychiatry and therapy. Continue Zoloft 150 mg daily, start Pristiq 50 mg daily as prescribed.  Agree to extend short-term disability leave through July 31 with a return date of August 1.  We will continue to monitor her symptoms, she will follow-up later this fall.      Relevant Medications   desvenlafaxine (PRISTIQ) 50 MG 24 hr tablet   Moderate episode of recurrent major depressive disorder (HCC)    Improved compared to last visit!  Commended her on regular follow-up with psychiatry and therapy. Continue Zoloft 150 mg daily, start Pristiq 50 mg daily as prescribed.  Agree to extend short-term disability leave through July 31 with a return date of August 1.  We will continue to  monitor her symptoms, she will follow-up later this fall.      Relevant Medications   desvenlafaxine (PRISTIQ) 50 MG 24 hr tablet     No orders of the defined types were placed in this encounter.  No orders of the defined types were placed in this encounter.   I discussed the assessment and treatment plan with the patient. The patient was provided an opportunity to ask questions and all were answered. The patient agreed with the plan and demonstrated an understanding of the instructions. The patient was advised to call back or seek an in-person evaluation if the symptoms worsen or if the condition fails to improve as anticipated.  Follow up plan:  We will extend your leave through July 31 with a return date of August 1.  Continue to follow with your psychiatrist and therapist.  Please schedule follow-up visit with me for November or December 2023.  It was a pleasure to see you today!  Doreene Nest, NP

## 2021-12-21 ENCOUNTER — Other Ambulatory Visit: Payer: Self-pay | Admitting: Primary Care

## 2021-12-21 DIAGNOSIS — K219 Gastro-esophageal reflux disease without esophagitis: Secondary | ICD-10-CM

## 2021-12-21 MED ORDER — FAMOTIDINE 20 MG PO TABS: 20.0000 mg | ORAL_TABLET | Freq: Two times a day (BID) | ORAL | 0 refills | Status: DC

## 2021-12-22 ENCOUNTER — Encounter: Payer: Self-pay | Admitting: Primary Care

## 2021-12-29 ENCOUNTER — Other Ambulatory Visit
Admission: RE | Admit: 2021-12-29 | Discharge: 2021-12-29 | Disposition: A | Discharge status: Home / Self Care | Payer: BC Managed Care – PPO | Source: Ambulatory Visit | Attending: Physician Assistant | Admitting: Physician Assistant

## 2021-12-29 DIAGNOSIS — E1169 Type 2 diabetes mellitus with other specified complication: Secondary | ICD-10-CM | POA: Insufficient documentation

## 2021-12-29 DIAGNOSIS — R079 Chest pain, unspecified: Secondary | ICD-10-CM | POA: Insufficient documentation

## 2021-12-29 DIAGNOSIS — I639 Cerebral infarction, unspecified: Secondary | ICD-10-CM | POA: Insufficient documentation

## 2021-12-29 DIAGNOSIS — R002 Palpitations: Secondary | ICD-10-CM | POA: Insufficient documentation

## 2021-12-29 DIAGNOSIS — E785 Hyperlipidemia, unspecified: Secondary | ICD-10-CM | POA: Insufficient documentation

## 2021-12-29 LAB — HEPATIC FUNCTION PANEL
ALT: 18 U/L (ref 0–44)
AST: 15 U/L (ref 15–41)
Albumin: 4.2 g/dL (ref 3.5–5.0)
Alkaline Phosphatase: 65 U/L (ref 38–126)
Bilirubin, Direct: <0.1 mg/dL (ref 0.0–0.2)
Total Bilirubin: 0.8 mg/dL (ref 0.3–1.2)
Total Protein: 7.9 g/dL (ref 6.5–8.1)

## 2021-12-30 ENCOUNTER — Telehealth: Payer: Self-pay | Admitting: Physician Assistant

## 2021-12-30 NOTE — Telephone Encounter (Signed)
Called patient back and informed her that her Liver function panel looked great. Everything was WNL. Patient wanted to know if Alycia Rossetti was going to put her back on her statin. I informed her that he has not resulted the lab yet, and that in the next couple days his nurse would reach out to her with instructions. Patient was grateful for the call back.

## 2021-12-30 NOTE — Telephone Encounter (Signed)
Follow up:     Patient saw lab  results on My Chart. She would like for someone to call her and explain her results please.

## 2021-12-31 NOTE — Telephone Encounter (Signed)
See other result note from today. Patient aware of results and recommendations.

## 2022-01-06 ENCOUNTER — Encounter (INDEPENDENT_AMBULATORY_CARE_PROVIDER_SITE_OTHER): Payer: Self-pay

## 2022-01-11 ENCOUNTER — Other Ambulatory Visit: Payer: Self-pay | Admitting: Primary Care

## 2022-01-11 DIAGNOSIS — E119 Type 2 diabetes mellitus without complications: Secondary | ICD-10-CM

## 2022-02-19 NOTE — Progress Notes (Unsigned)
Cardiology Office Note    Date:  02/24/2022   ID:  Americus Scheurich, DOB 1971/01/08, MRN [id number]  PCP:  Doreene Nest, NP  Cardiologist:  [name], MD  Electrophysiologist:  None   Chief Complaint: Follow-up  History of Present Illness:   Naveyah Iacovelli is a 51 y.o. female with history of recurrent strokes, DM2, HLD, Covid in 04/2021, asthma, IBS, and anxiety who presents for follow up of HLD.   She was admitted in 2016 with a left MCA infarct with an embolic pattern secondary to unknown source.  2D surface echo at that time demonstrated an EF of 50%, no regional wall motion abnormalities, grade 1 diastolic dysfunction, mild mitral regurgitation, normal-sized left atrium, normal RV systolic function and ventricular cavity size.  TEE revealed an EF of 45 to 50%, diffuse global hypokinesis, trivial mitral regurgitation, no PFO or ASD, no left atrial or left atrial appendage thrombus.  Transcranial Doppler was unrevealing.  She subsequently underwent ILR implantation.  Device interrogation has demonstrated no evidence of significant arrhythmia.  She was readmitted in 05/2016 with CVA with echo at that time demonstrating an EF of 55 to 60%, normal wall motion, normal LV diastolic function parameters, no PFO.  She was admitted in 12/2020 with recurrent stroke.  Repeat TEE showed no intracardiac thrombus or shunt.  Linear echodensity in the ascending aorta was noted and felt to most likely be artifact.  Subsequent CTA of the chest showed no significant abnormality.  She reestablished care with our office in 01/2021 for evaluation of chest pain.  She had recently visited the ED, and left without being seen due to prolonged wait and symptomatic improvement.  Subsequent coronary CTA on 02/12/2021 showed a calcium score of 0 with no evidence of CAD.  She was referred to EP for consideration of repeat ILR implantation.  She was seen in the office in 02/2021 and was doing fairly well from a cardiac  perspective.  She did report occasional palpitations that were improved when compared to her prior visit.  Initially, she was scheduled to see EP for consideration of loop recorder, though this appointment was canceled as precertification was not obtained.  She was subsequently reluctant to proceed with loop recorder reimplantation.  It was also noted recent carotid Doppler showed only mild right internal carotid artery stenosis.  She wished to think about further outpatient cardiac monitoring.  She was seen in the office in 05/2021 and was without symptoms of angina or decompensation.  She was planning to resume work at a part-time basis.  She preferred to continue to defer implantation of ILR.  Outpatient cardiac monitoring x 2 showed predominantly sinus rhythm with rare PACs and PVCs with no significant arrhythmia.  Repeat outpatient cardiac monitoring again demonstrated predominantly sinus rhythm with rare PACs and PVCs, as well as a single brief episode of PSVT lasting just 5 beats.  No evidence of A-fib/flutter.  She was last seen in the office in 10/2021 and was without symptoms of angina or decompensation.  She had undergone abdominal ultrasound for elevated LFTs, which was unrevealing.  Rosuvastatin was held, and she was continued on ezetimibe.  Subsequent LFTs were noted to be normal in 12/2021.  She comes in doing well from a cardiac perspective, and is without symptoms of angina or decompensation.  No palpitations, dyspnea, lower extremity swelling, or progressive orthopnea.  She is now back to work, full-time.  No falls or symptoms concerning for bleeding.  No dizziness, presyncope, or syncope.  She does continue to note ongoing fatigue and naps frequently.  Prior sleep study in 08/2017 was negative.   Labs independently reviewed: 12/2021 - albumin 4.2, AST/ALT normal 10/2021 - potassium 4.3, BUN 13, serum creatinine 0.97, A1c 6.2 05/2021 - TC 140, TG 70, HDL 68, LDL 57, HGB 14.7, PLT 249 12/2020 -  magnesium 2.1 08/2020 - TSH normal  Past Medical History:  Diagnosis Date   Anxiety    self reported   Asthma    Depression    controlled   Diabetes mellitus without complication ([place])    Hyperlipidemia    controlled with medication   IBS (irritable bowel syndrome)    Slurred speech    Stroke (cerebrum) ([place]) 01/07/2021   Stroke ([place]) 03/13/2015   Tension headache 11/18/2020   Word finding difficulty 02/27/2018    Past Surgical History:  Procedure Laterality Date   ABDOMINAL HYSTERECTOMY  10/2006   bowel reconstruction  10/2006   with hysterectomy   CESAREAN SECTION  2005   EP IMPLANTABLE DEVICE N/A 03/17/2015   Procedure: Loop Recorder Insertion;  Surgeon: Will Meredith Leeds, MD;  Location: [facility];  Service: Cardiovascular;  Laterality: N/A;   TEE WITHOUT CARDIOVERSION N/A 03/17/2015   Procedure: TRANSESOPHAGEAL ECHOCARDIOGRAM (TEE);  Surgeon: Skeet Latch, MD;  Location: [facility];  Service: Cardiovascular;  Laterality: N/A;   TEE WITHOUT CARDIOVERSION N/A 01/06/2021   Procedure: TRANSESOPHAGEAL ECHOCARDIOGRAM (TEE);  Surgeon: Nelva Bush, MD;  Location: [facility];  Service: Cardiovascular;  Laterality: N/A;   TOE SURGERY     Left 2nd metatarsal    Current Medications: Current Meds  Medication Sig   acetaminophen (TYLENOL) 325 MG tablet Take 1-2 tablets (325-650 mg total) by mouth every 4 (four) hours as needed for mild pain.   albuterol (VENTOLIN HFA) 108 (90 Base) MCG/ACT inhaler Inhale 2 puffs into the lungs every 4 (four) hours as needed for wheezing or shortness of breath (cough, shortness of breath or wheezing.).   Ascorbic Acid (VITAMIN C) 1000 MG tablet Take 1 tablet (1,000 mg total) by mouth daily.   cholecalciferol (VITAMIN D3) 25 MCG (1000 UNIT) tablet Take 1 tablet (1,000 Units total) by mouth daily.   clopidogrel (PLAVIX) 75 MG tablet Take 1 tablet by mouth once daily   desvenlafaxine (PRISTIQ) 50 MG 24 hr tablet Take 50 mg by mouth daily.    ezetimibe (ZETIA) 10 MG tablet TAKE 1 TABLET BY MOUTH ONCE DAILY FOR CHOLESTEROL   famotidine (PEPCID) 20 MG tablet Take 1 tablet (20 mg total) by mouth 2 (two) times daily. For heartburn.   hydrochlorothiazide (HYDRODIURIL) 12.5 MG tablet Take 1 tablet by mouth once daily for blood pressure   metFORMIN (GLUCOPHAGE-XR) 500 MG 24 hr tablet TAKE 1 TABLET BY MOUTH ONCE DAILY WITH BREAKFAST FOR DIABETES   metoprolol succinate (TOPROL XL) 25 MG 24 hr tablet Take 1 tablet (25 mg total) by mouth daily.   Multiple Vitamins-Minerals (MULTIVITAMIN WITH MINERALS) tablet Take 1 tablet by mouth daily.   pantoprazole (PROTONIX) 40 MG tablet Take 1 tablet (40 mg total) by mouth daily.   sertraline (ZOLOFT) 50 MG tablet Take 1 tablet (50 mg total) by mouth daily. for anxiety and depression.   topiramate (TOPAMAX) 25 MG tablet Take 1 tablet (25 mg total) by mouth daily. For headache prevention.    Allergies:   Cephalexin   Social History   Socioeconomic History   Marital status: Single    Spouse name: Not on file   Number of  children: Not on file   Years of education: Not on file   Highest education level: Not on file  Occupational History   Not on file  Tobacco Use   Smoking status: Never   Smokeless tobacco: Never  Vaping Use   Vaping Use: Never used  Substance and Sexual Activity   Alcohol use: Not Currently    Alcohol/week: 7.0 standard drinks of alcohol    Types: 7 Glasses of wine per week    Comment: red wine occasionally but not since stroke   Drug use: Not Currently    Types: Marijuana    Comment: edibles-twice weekly   Sexual activity: Not Currently  Other Topics Concern   Not on file  Social History Narrative   Originally from [place]   Family lives up here in [place]   Has one daughter.   Enjoys spending time shopping and spending time with family    Social Determinants of Health   Financial Resource Strain: Not on file  Food Insecurity: Not on file   Transportation Needs: Not on file  Physical Activity: Not on file  Stress: Not on file  Social Connections: Not on file     Family History:  The patient's family history includes Arthritis in her father; Diabetes in her father, paternal grandfather, and paternal grandmother; Heart attack in her maternal grandfather and paternal grandfather; Heart attack (age of onset: 28) in her brother; Heart failure in her father; Hyperlipidemia in her father, maternal grandfather, and mother; Hypertension in her father; Prostate cancer in her father, maternal grandfather, and maternal uncle; Stroke in her paternal grandmother. There is no history of Colon cancer, Colon polyps, Stomach cancer, Esophageal cancer, or Pancreatic cancer.  ROS:   12-point review of systems is negative unless otherwise noted in the HPI.   EKGs/Labs/Other Studies Reviewed:    Studies reviewed were summarized above. The additional studies were reviewed today:  2D echo 03/14/2015: - Left ventricle: The cavity size was normal. Systolic function was    normal. The estimated ejection fraction was 50%. Wall motion was    normal; there were no regional wall motion abnormalities. Doppler    parameters are consistent with abnormal left ventricular    relaxation (grade 1 diastolic dysfunction). Doppler parameters    are consistent with elevated ventricular end-diastolic filling    pressure.  - Aortic valve: Structurally normal valve. There was no    regurgitation.  - Mitral valve: Structurally normal valve. There was mild    regurgitation.  - Left atrium: The atrium was normal in size.  - Right ventricle: The cavity size was normal. Wall thickness was    normal. Systolic function was normal.  - Right atrium: The atrium was normal in size.  - Tricuspid valve: There was mild regurgitation.  - Pulmonic valve: There was trivial regurgitation.  - Inferior vena cava: The vessel was normal in size. The    respirophasic diameter  changes were in the normal range (>= 50%),    consistent with normal central venous pressure.  - Pericardium, extracardiac: The pericardium was normal in    appearance.   Impressions:   - LVEF is mildly impaired with diffuse hypokinesis.    Abnormal relaxation with mildly elevated filling pressures.    Mild MR and TR. __________   Transcranial doppler with bubbles 03/14/2015: Negative TCD Bubble study.No high intensity transient signals  (HITS) heard at rest or with valsalva. Therefore, there is no  apparent PFO. __________   TEE  03/17/2015: - Left ventricle: Systolic function was mildly reduced. The    estimated ejection fraction was in the range of 45% to 50%.    Diffuse hypokinesis.  - Left atrium: No evidence of thrombus in the atrial cavity or    appendage.  - Right atrium: No evidence of thrombus in the atrial cavity or    appendage.  - Atrial septum: No defect or patent foramen ovale was identified.    Echo contrast study showed no right-to-left atrial level shunt,    at baseline or with provocation. Negative saline microcavitation    study. ___________   2D echo 06/11/2016: - Left ventricle: The cavity size was normal. Wall thickness was    normal. Systolic function was normal. The estimated ejection    fraction was in the range of 55% to 60%. Wall motion was normal;    there were no regional wall motion abnormalities. Left    ventricular diastolic function parameters were normal.  - Atrial septum: No defect or patent foramen ovale was identified.   Impressions:   - No cardiac source of emboli was indentified. __________   2D echo 01/04/2021: 1. Left ventricular ejection fraction, by estimation, is 55 to 60%. The  left ventricle has normal function. The left ventricle has no regional  wall motion abnormalities. Left ventricular diastolic parameters are  consistent with Grade I diastolic  dysfunction (impaired relaxation).   2. Right ventricular systolic  function is normal. The right ventricular  size is normal.   3. The mitral valve is degenerative. Mild mitral valve regurgitation.   4. The aortic valve is tricuspid. Aortic valve regurgitation is not  visualized.   5. Agitated saline contrast bubble study was negative, with no evidence  of any interatrial shunt. __________   TEE 01/06/2021: 1. Left ventricular ejection fraction, by estimation, is 55 to 60%. The  left ventricle has normal function.   2. Right ventricular systolic function is normal. The right ventricular  size is normal.   3. No left atrial/left atrial appendage thrombus was detected. The LAA  emptying velocity was 76 cm/s.   4. The mitral valve is abnormal. Mild mitral valve regurgitation.   5. The aortic valve is tricuspid. Aortic valve regurgitation is not  visualized.   6. There is a linear echodensity in and adjacent to the ascending aorta  that most likely represents artifact. However, given cryptogenic strokes,  further evaluation with CTA could be helpful to exclude aortic pathology.   7. Agitated saline contrast bubble study was negative, with no evidence  of any interatrial shunt. __________   Coronary artery CTA 02/12/2021: FINDINGS: Aorta:  Normal size.  No calcifications.  No dissection.   Aortic Valve:  Trileaflet.  No calcifications.   Coronary Arteries:  Normal coronary origin.  Right dominance.   RCA is a dominant artery that gives rise to PDA and PLA. There is no plaque.   Left main is a large artery that gives rise to LAD and LCX arteries.   LAD has no plaque.   LCX is a non-dominant artery that gives rise to two obtuse marginal branches. There is no plaque.   Other findings:   Normal pulmonary vein drainage into the left atrium.   Normal left atrial appendage without a thrombus.   Normal size of the pulmonary artery.   IMPRESSION: 1. Normal coronary calcium score of 0. Patient is low risk for coronary events. 2. Normal coronary  origin with right dominance. 3. No evidence of CAD.  4. CAD-RADS 0. Consider non-atherosclerotic causes of chest pain. __________   Luci Bank patch 05/2021: The patient was monitored for 14 days. The predominant rhythm was sinus with an average rate of 87 bpm (range 52-161 bpm). There were rare PACs and PVCs. No sustained arrhythmia or prolonged pause was identified. Single patient triggered event corresponds to normal sinus rhythm.   Predominantly sinus rhythm with rare PACs and PVCs.  No significant arrhythmia identified. __________   Luci Bank patch 05/2021: The patient was monitored for 13 days, 9 hours. The predominant rhythm was sinus with an average rate of 90 bpm (range 51-184 bpm in sinus). There were rare PACs and PVCs. Single atrial run lasting 5 beats occurred with a maximum rate of 193 bpm. No sustained arrhythmia or prolonged pause was observed. Patient triggered event corresponds to sinus rhythm with artifact and PVC.   Predominantly sinus rhythm with rare PACs and PVCs as well as single brief episode of PSVT.  No atrial fibrillation/flutter observed.   EKG:  EKG is not ordered today.    Recent Labs: 10/30/2021: BUN 13; Creatinine, Ser 0.97; Potassium 4.3; Sodium 141 12/29/2021: ALT 18  Recent Lipid Panel    Component Value Date/Time   CHOL 140 06/16/2021 0810   CHOL 184 08/28/2019 1537   TRIG 70.0 06/16/2021 0810   HDL 68.60 06/16/2021 0810   HDL 75 08/28/2019 1537   CHOLHDL 2 06/16/2021 0810   VLDL 14.0 06/16/2021 0810   LDLCALC 57 06/16/2021 0810   LDLCALC 85 08/28/2019 1537    PHYSICAL EXAM:    VS:  BP 130/78   Pulse 75   Ht  (1.575 m)   Wt 175 lb 3.2 oz (79.5 kg)   SpO2 99%   BMI 32.04 kg/m   BMI: Body mass index is 32.04 kg/m.  Physical Exam Vitals reviewed.  Constitutional:      Appearance: She is well-developed.  HENT:     Head: Normocephalic and atraumatic.  Eyes:     General:        Right eye: No discharge.        Left eye: No discharge.   Neck:     Vascular: No JVD.  Cardiovascular:     Rate and Rhythm: Normal rate and regular rhythm.     Pulses:          Posterior tibial pulses are 2+ on the right side and 2+ on the left side.     Heart sounds: Normal heart sounds, S1 normal and S2 normal. Heart sounds not distant. No midsystolic click and no opening snap. No murmur heard.    No friction rub.  Pulmonary:     Effort: Pulmonary effort is normal. No respiratory distress.     Breath sounds: Normal breath sounds. No decreased breath sounds, wheezing or rales.  Chest:     Chest wall: No tenderness.  Abdominal:     General: There is no distension.  Musculoskeletal:     Cervical back: Normal range of motion.     Right lower leg: No edema.     Left lower leg: No edema.  Skin:    General: Skin is warm and dry.     Nails: There is no clubbing.  Neurological:     Mental Status: She is alert and oriented to person, place, and time.  Psychiatric:        Speech: Speech normal.        Behavior: Behavior normal.  Thought Content: Thought content normal.        Judgment: Judgment normal.     Wt Readings from Last 3 Encounters:  02/24/22 175 lb 3.2 oz (79.5 kg)  12/16/21 169 lb (76.7 kg)  12/15/21 169 lb (76.7 kg)     ASSESSMENT & PLAN:   Recurrent CVAs: Prior extensive cardiac work-up including TEE x2, transcranial Doppler, ILR with subsequent Zio patch monitoring x2 have been unrevealing for cardiac etiology of recurrent CVA.  She remains on ezetimibe as outlined below.  No longer on statin therapy secondary to elevated AST/ALT.  Follow-up with PCP and neurology as directed.  Chest pain/palpitations: No further symptoms of chest pain.  Palpitations quiescent.  She remains on clopidogrel given recurrent CVAs as outlined above.  Aggressive risk factor modification and primary prevention.  HLD associated DM2: Last LDL 57 in 05/2021.  However, rosuvastatin therapy was interrupted in the context of elevated AST/ALT with  LFTs normalizing off statin therapy.  She has remained on ezetimibe.  Check LFT, lipid panel, and direct LDL.  Follow-up with PCP for ongoing diabetic care.  History of transaminitis: LFTs have normalized on recheck in 12/2021, off rosuvastatin.  She remains on ezetimibe.  Fatigue: Prior sleep study negative.  Could consider transitioning from metoprolol to alternative beta-blocker to see if this helps.  She would like to see how things progress over the next several months prior to making this transition.  There is likely some component of deconditioning contributing as well.   Disposition: F/u with Dr. Okey Dupre or an APP in 6 months.   Medication Adjustments/Labs and Tests Ordered: Current medicines are reviewed at length with the patient today.  Concerns regarding medicines are outlined above. Medication changes, Labs and Tests ordered today are summarized above and listed in the Patient Instructions accessible in Encounters.   Signed, Eula Listen, PA-C 02/24/2022 4:20 PM     [facility] [place] [address] [place], [place] [address] [phone]

## 2022-02-24 ENCOUNTER — Other Ambulatory Visit
Admission: RE | Admit: 2022-02-24 | Discharge: 2022-02-24 | Disposition: A | Discharge status: Home / Self Care | Payer: BC Managed Care – PPO | Source: Ambulatory Visit | Attending: Physician Assistant | Admitting: Physician Assistant

## 2022-02-24 ENCOUNTER — Encounter: Payer: Self-pay | Admitting: Physician Assistant

## 2022-02-24 ENCOUNTER — Ambulatory Visit: Payer: BC Managed Care – PPO | Attending: Physician Assistant | Admitting: Physician Assistant

## 2022-02-24 VITALS — BP 130/78 | HR 75 | Ht 62.0 in | Wt 175.2 lb

## 2022-02-24 DIAGNOSIS — R002 Palpitations: Secondary | ICD-10-CM | POA: Diagnosis not present

## 2022-02-24 DIAGNOSIS — E1169 Type 2 diabetes mellitus with other specified complication: Secondary | ICD-10-CM

## 2022-02-24 DIAGNOSIS — E785 Hyperlipidemia, unspecified: Secondary | ICD-10-CM

## 2022-02-24 DIAGNOSIS — R079 Chest pain, unspecified: Secondary | ICD-10-CM | POA: Diagnosis not present

## 2022-02-24 DIAGNOSIS — I639 Cerebral infarction, unspecified: Secondary | ICD-10-CM | POA: Diagnosis not present

## 2022-02-24 DIAGNOSIS — R7989 Other specified abnormal findings of blood chemistry: Secondary | ICD-10-CM | POA: Insufficient documentation

## 2022-02-24 LAB — LDL CHOLESTEROL, DIRECT: Direct LDL: 160 mg/dL — ABNORMAL HIGH (ref 0–99)

## 2022-02-24 LAB — HEPATIC FUNCTION PANEL
ALT: 20 U/L (ref 0–44)
AST: 19 U/L (ref 15–41)
Albumin: 3.9 g/dL (ref 3.5–5.0)
Alkaline Phosphatase: 70 U/L (ref 38–126)
Bilirubin, Direct: <0.1 mg/dL (ref 0.0–0.2)
Total Bilirubin: 0.7 mg/dL (ref 0.3–1.2)
Total Protein: 7.5 g/dL (ref 6.5–8.1)

## 2022-02-24 LAB — LIPID PANEL
Cholesterol: 277 mg/dL — ABNORMAL HIGH (ref 0–200)
HDL: 79 mg/dL (ref >40)
LDL Cholesterol: 161 mg/dL — ABNORMAL HIGH (ref 0–99)
Total CHOL/HDL Ratio: 3.5 RATIO
Triglycerides: 186 mg/dL — ABNORMAL HIGH (ref <150)
VLDL: 37 mg/dL (ref 0–40)

## 2022-02-24 NOTE — Patient Instructions (Signed)
Medication Instructions:  No changes at this time.   *If you need a refill on your cardiac medications before your next appointment, please call your pharmacy*   Lab Work: LFT, Lipid panel, and Direct LDL over at the [facility] entrance and stop at registration desk.   If you have labs (blood work) drawn today and your tests are completely normal, you will receive your results only by: [place] (if you have MyChart) OR A paper copy in the mail If you have any lab test that is abnormal or we need to change your treatment, we will call you to review the results.   Testing/Procedures: None   Follow-Up: At [facility], you and your health needs are our priority.  As part of our continuing mission to provide you with exceptional heart care, we have created designated Provider Care Teams.  These Care Teams include your primary Cardiologist (physician) and Advanced Practice Providers (APPs -  Physician Assistants and Nurse Practitioners) who all work together to provide you with the care you need, when you need it.   Your next appointment:   6 month(s)  The format for your next appointment:   In Person  Provider:   Nelva Bush, MD or Christell Faith, PA-C        Important Information About Sugar

## 2022-02-25 ENCOUNTER — Other Ambulatory Visit: Payer: Self-pay | Admitting: *Deleted

## 2022-02-25 DIAGNOSIS — R002 Palpitations: Secondary | ICD-10-CM

## 2022-02-25 DIAGNOSIS — I639 Cerebral infarction, unspecified: Secondary | ICD-10-CM

## 2022-02-25 DIAGNOSIS — R7989 Other specified abnormal findings of blood chemistry: Secondary | ICD-10-CM

## 2022-02-25 DIAGNOSIS — E1169 Type 2 diabetes mellitus with other specified complication: Secondary | ICD-10-CM

## 2022-02-25 MED ORDER — ROSUVASTATIN CALCIUM 5 MG PO TABS: 5.0000 mg | ORAL_TABLET | Freq: Every day | ORAL | 3 refills | Status: DC

## 2022-03-01 NOTE — Telephone Encounter (Signed)
Called and scheduled video visit for 10/03.

## 2022-03-02 ENCOUNTER — Encounter: Payer: Self-pay | Admitting: Primary Care

## 2022-03-02 ENCOUNTER — Telehealth (INDEPENDENT_AMBULATORY_CARE_PROVIDER_SITE_OTHER): Payer: BC Managed Care – PPO | Admitting: Primary Care

## 2022-03-02 DIAGNOSIS — F331 Major depressive disorder, recurrent, moderate: Secondary | ICD-10-CM | POA: Diagnosis not present

## 2022-03-02 DIAGNOSIS — F411 Generalized anxiety disorder: Secondary | ICD-10-CM

## 2022-03-02 NOTE — Assessment & Plan Note (Signed)
Improving!  Continue sertraline 150 mg daily, Pristiq 50 mg daily. Continue psychiatry and therapy follow-up.  Agree to provide work excuse from October 9 through 13 with a return to work date of October 16.  

## 2022-03-02 NOTE — Progress Notes (Signed)
Patient ID: Amber [name], female    DOB: Oct 26, 1970, 51 y.o.   MRN: [id number]  Virtual visit completed through [employer], a video enabled telemedicine application. Due to national recommendations of social distancing due to COVID-19, a virtual visit is felt to be most appropriate for this patient at this time. Reviewed limitations, risks, security and privacy concerns of performing a virtual visit and the availability of in person appointments. I also reviewed that there may be a patient responsible charge related to this service. The patient agreed to proceed.   Patient location: home Provider location: [place] at [facility], office Persons participating in this virtual visit: patient, provider   If any vitals were documented, they were collected by patient at home unless specified below.    There were no vitals taken for this visit.   CC: Stress Subjective:   HPI: Amber [name] is a 51 y.o. female with a history of hypertension, recurrent strokes, hyperlipidemia, type 2 diabetes, major depressive disorder, generalized anxiety disorder presenting on 03/02/2022 for Stress  Currently following with psychiatry and therapy and is managed on sertraline 150 mg daily, and Pristiq 50 mg daily.  She had also been out on continuous medical leave from work for stress/depression/anxiety which began 11/05/2021 and ended 12/29/2021.  Since returning to work she has been doing well, but she is requesting a "break".  "My brain is tired" and she is requesting a small break from her job. The kids in her group are on Fall break from October 9-13 and she would like to take the same amount of time off for resting. She will need a work note and plans to take paid time off. She will not be using FMLA.   Overall she's doing better, but she continues to experience difficulty focusing, especially when charting on the computer for work. She continues to feel fatigued. Family stress has decreased some, her  father's heath is about the same.   She continues to follow with her therapist twice monthly and psychiatrist once monthly.       Relevant past medical, surgical, family and social history reviewed and updated as indicated. Interim medical history since our last visit reviewed. Allergies and medications reviewed and updated. Outpatient Medications Prior to Visit  Medication Sig Dispense Refill   acetaminophen (TYLENOL) 325 MG tablet Take 1-2 tablets (325-650 mg total) by mouth every 4 (four) hours as needed for mild pain.     albuterol (VENTOLIN HFA) 108 (90 Base) MCG/ACT inhaler Inhale 2 puffs into the lungs every 4 (four) hours as needed for wheezing or shortness of breath (cough, shortness of breath or wheezing.). 1 each 1   Ascorbic Acid (VITAMIN C) 1000 MG tablet Take 1 tablet (1,000 mg total) by mouth daily. 30 tablet 0   azelastine (ASTELIN) 0.1 % nasal spray Place 1 spray into both nostrils 2 (two) times daily. Use in each nostril as directed 30 mL 0   cholecalciferol (VITAMIN D3) 25 MCG (1000 UNIT) tablet Take 1 tablet (1,000 Units total) by mouth daily. 30 tablet 0   clopidogrel (PLAVIX) 75 MG tablet Take 1 tablet by mouth once daily 90 tablet 1   desvenlafaxine (PRISTIQ) 50 MG 24 hr tablet Take 50 mg by mouth daily.     ezetimibe (ZETIA) 10 MG tablet TAKE 1 TABLET BY MOUTH ONCE DAILY FOR CHOLESTEROL 90 tablet 2   famotidine (PEPCID) 20 MG tablet Take 1 tablet (20 mg total) by mouth 2 (two) times daily. For heartburn. 60 tablet  0   hydrochlorothiazide (HYDRODIURIL) 12.5 MG tablet Take 1 tablet by mouth once daily for blood pressure 90 tablet 3   metFORMIN (GLUCOPHAGE-XR) 500 MG 24 hr tablet TAKE 1 TABLET BY MOUTH ONCE DAILY WITH BREAKFAST FOR DIABETES 90 tablet 1   metoprolol succinate (TOPROL XL) 25 MG 24 hr tablet Take 1 tablet (25 mg total) by mouth daily. 30 tablet 1   Multiple Vitamins-Minerals (MULTIVITAMIN WITH MINERALS) tablet Take 1 tablet by mouth daily.     pantoprazole  (PROTONIX) 40 MG tablet Take 1 tablet (40 mg total) by mouth daily. 30 tablet 3   rosuvastatin (CRESTOR) 5 MG tablet Take 1 tablet (5 mg total) by mouth daily. 90 tablet 3   sertraline (ZOLOFT) 50 MG tablet Take 1 tablet (50 mg total) by mouth daily. for anxiety and depression. 90 tablet 1   topiramate (TOPAMAX) 25 MG tablet Take 1 tablet (25 mg total) by mouth daily. For headache prevention. 90 tablet 3   ciclopirox (PENLAC) 8 % solution Apply topically at bedtime. Apply over nail and surrounding skin. Apply daily over previous coat. After seven (7) days, may remove with alcohol and continue cycle. (Patient not taking: Reported on 02/24/2022) 6.6 mL 3   Continuous Blood Gluc Receiver (FREESTYLE LIBRE 2 READER) DEVI Use to check blood sugars. (Patient not taking: Reported on 03/02/2022) 1 each 0   Continuous Blood Gluc Sensor (FREESTYLE LIBRE 2 SENSOR) MISC Apply every 14 days to check blood sugars. (Patient not taking: Reported on 03/02/2022) 6 each 1   No facility-administered medications prior to visit.     Per HPI unless specifically indicated in ROS section below Review of Systems  Constitutional:  Positive for fatigue.  Psychiatric/Behavioral:  Negative for sleep disturbance.        See HPI   Objective:  There were no vitals taken for this visit.  Wt Readings from Last 3 Encounters:  02/24/22 175 lb 3.2 oz (79.5 kg)  12/16/21 169 lb (76.7 kg)  12/15/21 169 lb (76.7 kg)       Physical exam: General: Alert and oriented x 3, no distress, does not appear sickly  Pulmonary: Speaks in complete sentences without increased work of breathing, no cough during visit.  Psychiatric: Normal mood, thought content, and behavior.     Results for orders placed or performed during the hospital encounter of 02/24/22  Lipid panel  Result Value Ref Range   Cholesterol 277 (H) 0 - 200 mg/dL   Triglycerides 186 (H) <150 mg/dL   HDL 79 >40 mg/dL   Total CHOL/HDL Ratio 3.5 RATIO   VLDL 37 0 - 40  mg/dL   LDL Cholesterol 161 (H) 0 - 99 mg/dL  Hepatic function panel  Result Value Ref Range   Total Protein 7.5 6.5 - 8.1 g/dL   Albumin 3.9 3.5 - 5.0 g/dL   AST 19 15 - 41 U/L   ALT 20 0 - 44 U/L   Alkaline Phosphatase 70 38 - 126 U/L   Total Bilirubin 0.7 0.3 - 1.2 mg/dL   Bilirubin, Direct <0.1 0.0 - 0.2 mg/dL   Indirect Bilirubin NOT CALCULATED 0.3 - 0.9 mg/dL  Direct LDL  Result Value Ref Range   Direct LDL 160 (H) 0 - 99 mg/dL   Assessment & Plan:   Problem List Items Addressed This Visit       Other   GAD (generalized anxiety disorder)    Improving!  Continue sertraline 150 mg daily, Pristiq 50 mg daily. Continue  psychiatry and therapy follow-up.  Agree to provide work excuse from October 9 through 13 with a return to work date of October 16.      Moderate episode of recurrent major depressive disorder [facility])    Improving!  Continue sertraline 150 mg daily, Pristiq 50 mg daily. Continue psychiatry and therapy follow-up.  Agree to provide work excuse from October 9 through 13 with a return to work date of October 16.         No orders of the defined types were placed in this encounter.  No orders of the defined types were placed in this encounter.   I discussed the assessment and treatment plan with the patient. The patient was provided an opportunity to ask questions and all were answered. The patient agreed with the plan and demonstrated an understanding of the instructions. The patient was advised to call back or seek an in-person evaluation if the symptoms worsen or if the condition fails to improve as anticipated.  Follow up plan:  Continue to follow with your psychiatrist and therapist.   I will attach your work note to your profile.   We will see you in December!  Pleas Koch, NP

## 2022-03-02 NOTE — Assessment & Plan Note (Signed)
Improving!  Continue sertraline 150 mg daily, Pristiq 50 mg daily. Continue psychiatry and therapy follow-up.  Agree to provide work excuse from October 9 through 13 with a return to work date of October 16.

## 2022-03-02 NOTE — Patient Instructions (Signed)
Continue to follow with your psychiatrist and therapist.   I will attach your work note to your profile.   We will see you in December!

## 2022-03-30 ENCOUNTER — Other Ambulatory Visit
Admission: RE | Admit: 2022-03-30 | Discharge: 2022-03-30 | Disposition: A | Discharge status: Home / Self Care | Payer: BC Managed Care – PPO | Attending: Physician Assistant | Admitting: Physician Assistant

## 2022-03-30 DIAGNOSIS — I639 Cerebral infarction, unspecified: Secondary | ICD-10-CM | POA: Insufficient documentation

## 2022-03-30 DIAGNOSIS — R002 Palpitations: Secondary | ICD-10-CM | POA: Insufficient documentation

## 2022-03-30 DIAGNOSIS — E785 Hyperlipidemia, unspecified: Secondary | ICD-10-CM | POA: Diagnosis present

## 2022-03-30 DIAGNOSIS — E1169 Type 2 diabetes mellitus with other specified complication: Secondary | ICD-10-CM | POA: Diagnosis not present

## 2022-03-30 DIAGNOSIS — R7989 Other specified abnormal findings of blood chemistry: Secondary | ICD-10-CM | POA: Diagnosis present

## 2022-03-30 LAB — LIPID PANEL
Cholesterol: 183 mg/dL (ref 0–200)
HDL: 82 mg/dL (ref >40)
LDL Cholesterol: 89 mg/dL (ref 0–99)
Total CHOL/HDL Ratio: 2.2 RATIO
Triglycerides: 59 mg/dL (ref <150)
VLDL: 12 mg/dL (ref 0–40)

## 2022-03-30 LAB — HEPATIC FUNCTION PANEL
ALT: 25 U/L (ref 0–44)
AST: 23 U/L (ref 15–41)
Albumin: 3.9 g/dL (ref 3.5–5.0)
Alkaline Phosphatase: 70 U/L (ref 38–126)
Bilirubin, Direct: <0.1 mg/dL (ref 0.0–0.2)
Total Bilirubin: 0.6 mg/dL (ref 0.3–1.2)
Total Protein: 7.5 g/dL (ref 6.5–8.1)

## 2022-03-31 ENCOUNTER — Telehealth: Payer: Self-pay | Admitting: Physician Assistant

## 2022-03-31 DIAGNOSIS — E1169 Type 2 diabetes mellitus with other specified complication: Secondary | ICD-10-CM

## 2022-03-31 DIAGNOSIS — R7989 Other specified abnormal findings of blood chemistry: Secondary | ICD-10-CM

## 2022-03-31 MED ORDER — ROSUVASTATIN CALCIUM 10 MG PO TABS: 10.0000 mg | ORAL_TABLET | Freq: Every day | ORAL | 1 refills | Status: DC

## 2022-03-31 NOTE — Telephone Encounter (Signed)
Called patient and discussed the following lab result note with her.  Liver function normal Total cholesterol, triglycerides, HDL, and LDL are all improved LDL does remain above goal though   Recommendations: -Increase Crestor to 10 mg daily -Follow up fasting lipid panel and LFT in 2 months to ensure stable liver function and to evaluate if her LDL is at goal on titrated dose of Crestor

## 2022-03-31 NOTE — Telephone Encounter (Signed)
Patient called for her lab results.  °

## 2022-04-06 ENCOUNTER — Telehealth: Payer: Self-pay | Admitting: Primary Care

## 2022-04-06 NOTE — Telephone Encounter (Signed)
Type of forms received:Medical Leave  Routed to:K  [place] received by :Gwynn Burly   Individual made aware of 3-5 business day turn around (Y/N):Y  Form completed and patient made aware of charges(Y/N):Y   Faxed to :   Form location: Place in [employer] folder

## 2022-04-06 NOTE — Telephone Encounter (Signed)
Completed and placed on Kate's desk 

## 2022-04-06 NOTE — Telephone Encounter (Signed)
Received FMLA form for patient.  Amber [name] has attached a letter to the form with her requests.  Form has been placed in your inbox for completion and signing.  No due date listed on form.

## 2022-04-07 NOTE — Telephone Encounter (Signed)
[facility] faxed completed form to fax#[phone].  Received fax confirmation.  Patient picked up copy of form.  Copies were made for scanning and myself.

## 2022-04-21 ENCOUNTER — Ambulatory Visit (INDEPENDENT_AMBULATORY_CARE_PROVIDER_SITE_OTHER): Payer: BC Managed Care – PPO | Admitting: Primary Care

## 2022-04-21 ENCOUNTER — Encounter: Payer: Self-pay | Admitting: Primary Care

## 2022-04-21 VITALS — BP 122/78 | HR 95 | Temp 97.9°F | Ht 62.0 in | Wt 177.0 lb

## 2022-04-21 DIAGNOSIS — F331 Major depressive disorder, recurrent, moderate: Secondary | ICD-10-CM

## 2022-04-21 DIAGNOSIS — E6609 Other obesity due to excess calories: Secondary | ICD-10-CM | POA: Insufficient documentation

## 2022-04-21 DIAGNOSIS — K219 Gastro-esophageal reflux disease without esophagitis: Secondary | ICD-10-CM

## 2022-04-21 DIAGNOSIS — G44209 Tension-type headache, unspecified, not intractable: Secondary | ICD-10-CM | POA: Diagnosis not present

## 2022-04-21 DIAGNOSIS — Z6832 Body mass index (BMI) 32.0-32.9, adult: Secondary | ICD-10-CM

## 2022-04-21 DIAGNOSIS — E1165 Type 2 diabetes mellitus with hyperglycemia: Secondary | ICD-10-CM | POA: Diagnosis not present

## 2022-04-21 DIAGNOSIS — F411 Generalized anxiety disorder: Secondary | ICD-10-CM

## 2022-04-21 LAB — HEMOGLOBIN A1C: Hgb A1c MFr Bld: 6.4 % (ref 4.6–6.5)

## 2022-04-21 LAB — VITAMIN D 25 HYDROXY (VIT D DEFICIENCY, FRACTURES): VITD: 55.54 ng/mL (ref 30.00–100.00)

## 2022-04-21 MED ORDER — PANTOPRAZOLE SODIUM 20 MG PO TBEC: 20.0000 mg | DELAYED_RELEASE_TABLET | Freq: Every day | ORAL | 0 refills | Status: DC

## 2022-04-21 MED ORDER — OZEMPIC (0.25 OR 0.5 MG/DOSE) 2 MG/3ML ~~LOC~~ SOPN: PEN_INJECTOR | SUBCUTANEOUS | 0 refills | Status: DC

## 2022-04-21 NOTE — Assessment & Plan Note (Addendum)
Repeat A1C pending today.  Continue metformin XR 500 mg daily.  Agree to start Ozempic 0.25 mg weekly x 4 weeks, then increase to 0.5 mg weekly thereafter.   Foot and eye exams UTD. Managed on statin.  Urine microalbumin UTD  Follow up in 3 months.

## 2022-04-21 NOTE — Patient Instructions (Signed)
We reduced your heartburn medication (pantoprazole) to 20 mg.   Start taking your topiramate (Topamax) 25 mg tablet every night at bedtime for headache prevention.  Start Ozempic 0.25 mg, inject once weekly x4 weeks.  Increase to 0.5 mg once weekly thereafter.  Stop by the lab prior to leaving today. I will notify you of your results once received.   Please schedule a follow up visit for 3 months.  It was a pleasure to see you today!

## 2022-04-21 NOTE — Assessment & Plan Note (Signed)
Improving.  Continue Pristiq 50 mg daily, sertraline 50 mg daily. Continue with psychiatry and therapy follow up.

## 2022-04-21 NOTE — Assessment & Plan Note (Signed)
Improving.  Continue Pristiq 50 mg daily, sertraline 50 mg daily. Continue with psychiatry and therapy follow up. 

## 2022-04-21 NOTE — Progress Notes (Signed)
Subjective:    Patient ID: Amber [name], female    DOB: Nov 23, 1970, 51 y.o.   MRN: [id number]  HPI  Amber [name] is a very pleasant 51 y.o. female with a significant history including recurrent CVA, type 2 diabetes, GAD, depression, hyperlipidemia, IBS, who presents today for follow-up of chronic conditions. She is also needing refills of her pantoprazole.   1) Type 2 Diabetes:  Current medications include: Metformin XR 500 mg daily. She is interested in starting Ozempic for weight loss.   She is checking her blood glucose 0 times daily. Last A1C: 6.2 in June 2023 Last Eye Exam: UTD Last Foot Exam: UTD Pneumonia Vaccination: UTD Urine Microalbumin: TD Statin: rosuvastatin   Dietary changes since last visit:   Exercise: No regular exercise, but active at work  2) GAD/MDD: Currently following with both psychiatry and therapy. She is managed on Zoloft 50 mg daily, Pristiq 50 mg daily. Overall feeling improved compared to months ago.   She continues to struggle with concentration, specifically when sitting and trying to do a task such as charting on her laptop for work. She is busy at work.   3) Frequent Headaches: Currently managed on Topamax 25 mg daily for which she as needed which is about twice weekly. She continues to experience tension headaches twice weekly on average which will last for a few hours. She will typically take an Excedrin Migraine pill and sleep. No recent migraines. She attributes her tension headaches to stress.   4) Hyperlipidemia: Currently managed on Zetia 10 mg daily and rosuvastatin 10 mg daily. Rosuvastatin was recently changed by cardiology as LDL was not at goal. She is due for repeat lipids around early January 2024.   BP Readings from Last 3 Encounters:  04/21/22 122/78  02/24/22 130/78  12/15/21 121/87     Review of Systems  Respiratory:  Negative for shortness of breath.   Cardiovascular:  Negative for chest pain.  Neurological:   Positive for headaches.  Psychiatric/Behavioral:  The patient is nervous/anxious.          Past Medical History:  Diagnosis Date   Anxiety    self reported   Asthma    Depression    controlled   Diabetes mellitus without complication (HCC)    Hyperlipidemia    controlled with medication   IBS (irritable bowel syndrome)    Slurred speech    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   Stroke (HCC) 03/13/2015   Tension headache 11/18/2020   Word finding difficulty 02/27/2018    Social History   Socioeconomic History   Marital status: Single    Spouse name: Not on file   Number of children: Not on file   Years of education: Not on file   Highest education level: Not on file  Occupational History   Not on file  Tobacco Use   Smoking status: Never   Smokeless tobacco: Never  Vaping Use   Vaping Use: Never used  Substance and Sexual Activity   Alcohol use: Not Currently    Alcohol/week: 7.0 standard drinks of alcohol    Types: 7 Glasses of wine per week    Comment: red wine occasionally but not since stroke   Drug use: Not Currently    Types: Marijuana    Comment: edibles-twice weekly   Sexual activity: Not Currently  Other Topics Concern   Not on file  Social History Narrative   Originally from [place]   Family lives up here in [place]  Has one daughter.   Enjoys spending time shopping and spending time with family    Social Determinants of Health   Financial Resource Strain: Not on file  Food Insecurity: Not on file  Transportation Needs: Not on file  Physical Activity: Not on file  Stress: Not on file  Social Connections: Not on file  Intimate Partner Violence: Not on file    Past Surgical History:  Procedure Laterality Date   ABDOMINAL HYSTERECTOMY  10/2006   bowel reconstruction  10/2006   with hysterectomy   CESAREAN SECTION  2005   EP IMPLANTABLE DEVICE N/A 03/17/2015   Procedure: Loop Recorder Insertion;  Surgeon: Will Jorja Loa,  MD;  Location: [facility] INVASIVE CV LAB;  Service: Cardiovascular;  Laterality: N/A;   TEE WITHOUT CARDIOVERSION N/A 03/17/2015   Procedure: TRANSESOPHAGEAL ECHOCARDIOGRAM (TEE);  Surgeon: Chilton Si, MD;  Location: [facility] ENDOSCOPY;  Service: Cardiovascular;  Laterality: N/A;   TEE WITHOUT CARDIOVERSION N/A 01/06/2021   Procedure: TRANSESOPHAGEAL ECHOCARDIOGRAM (TEE);  Surgeon: [name], MD;  Location: [facility] ORS;  Service: Cardiovascular;  Laterality: N/A;   TOE SURGERY     Left 2nd metatarsal    Family History  Problem Relation Age of Onset   Hyperlipidemia Mother    Heart failure Father    Arthritis Father    Diabetes Father    Hypertension Father    Hyperlipidemia Father    Prostate cancer Father    Heart attack Brother 25   Prostate cancer Maternal Uncle        x 3   Heart attack Maternal Grandfather    Hyperlipidemia Maternal Grandfather    Prostate cancer Maternal Grandfather    Diabetes Paternal Grandmother    Stroke Paternal Grandmother    Diabetes Paternal Grandfather    Heart attack Paternal Grandfather    Colon cancer Neg Hx    Colon polyps Neg Hx    Stomach cancer Neg Hx    Esophageal cancer Neg Hx    Pancreatic cancer Neg Hx     Allergies  Allergen Reactions   Cephalexin Hives and Rash    Can take Augmentin    Current Outpatient Medications on File Prior to Visit  Medication Sig Dispense Refill   albuterol (VENTOLIN HFA) 108 (90 Base) MCG/ACT inhaler Inhale 2 puffs into the lungs every 4 (four) hours as needed for wheezing or shortness of breath (cough, shortness of breath or wheezing.). 1 each 1   Ascorbic Acid (VITAMIN C) 1000 MG tablet Take 1 tablet (1,000 mg total) by mouth daily. 30 tablet 0   cholecalciferol (VITAMIN D3) 25 MCG (1000 UNIT) tablet Take 1 tablet (1,000 Units total) by mouth daily. 30 tablet 0   clopidogrel (PLAVIX) 75 MG tablet Take 1 tablet by mouth once daily 90 tablet 1   desvenlafaxine (PRISTIQ) 50 MG 24 hr tablet Take 50 mg by  mouth daily.     ezetimibe (ZETIA) 10 MG tablet TAKE 1 TABLET BY MOUTH ONCE DAILY FOR CHOLESTEROL 90 tablet 2   famotidine (PEPCID) 20 MG tablet Take 1 tablet (20 mg total) by mouth 2 (two) times daily. For heartburn. 60 tablet 0   hydrochlorothiazide (HYDRODIURIL) 12.5 MG tablet Take 1 tablet by mouth once daily for blood pressure 90 tablet 3   metFORMIN (GLUCOPHAGE-XR) 500 MG 24 hr tablet TAKE 1 TABLET BY MOUTH ONCE DAILY WITH BREAKFAST FOR DIABETES 90 tablet 1   metoprolol succinate (TOPROL XL) 25 MG 24 hr tablet Take 1 tablet (25 mg total) by mouth  daily. 30 tablet 1   Multiple Vitamins-Minerals (MULTIVITAMIN WITH MINERALS) tablet Take 1 tablet by mouth daily.     rosuvastatin (CRESTOR) 10 MG tablet Take 1 tablet (10 mg total) by mouth daily. 90 tablet 1   sertraline (ZOLOFT) 50 MG tablet Take 1 tablet (50 mg total) by mouth daily. for anxiety and depression. 90 tablet 1   topiramate (TOPAMAX) 25 MG tablet Take 1 tablet (25 mg total) by mouth daily. For headache prevention. 90 tablet 3   azelastine (ASTELIN) 0.1 % nasal spray Place 1 spray into both nostrils 2 (two) times daily. Use in each nostril as directed (Patient not taking: Reported on 04/21/2022) 30 mL 0   ciclopirox (PENLAC) 8 % solution Apply topically at bedtime. Apply over nail and surrounding skin. Apply daily over previous coat. After seven (7) days, may remove with alcohol and continue cycle. (Patient not taking: Reported on 02/24/2022) 6.6 mL 3   No current facility-administered medications on file prior to visit.    BP 122/78   Pulse 95   Temp 97.9 F (36.6 C) (Temporal)   Ht 5\' 2"  (1.575 m)   Wt 177 lb (80.3 kg)   SpO2 98%   BMI 32.37 kg/m  Objective:   Physical Exam Cardiovascular:     Rate and Rhythm: Normal rate and regular rhythm.  Pulmonary:     Effort: Pulmonary effort is normal.     Breath sounds: Normal breath sounds.  Musculoskeletal:     Cervical back: Neck supple.  Skin:    General: Skin is warm and  dry.           Assessment & Plan:   Problem List Items Addressed This Visit       Digestive   GERD (gastroesophageal reflux disease)    Controlled.  Reduce pantoprazole to 20 mg daily to see if she can tolerate. She will update if symptoms return.      Relevant Medications   pantoprazole (PROTONIX) 20 MG tablet     Endocrine   Controlled type 2 diabetes mellitus with hyperglycemia (HCC) - Primary    Repeat A1C pending today.  Continue metformin XR 500 mg daily.  Agree to start Ozempic 0.25 mg weekly x 4 weeks, then increase to 0.5 mg weekly thereafter.   Foot and eye exams UTD. Managed on statin.  Urine microalbumin UTD  Follow up in 3 months.      Relevant Medications   Semaglutide,0.25 or 0.5MG /DOS, (OZEMPIC, 0.25 OR 0.5 MG/DOSE,) 2 MG/3ML SOPN   Other Relevant Orders   VITAMIN D 25 Hydroxy (Vit-D Deficiency, Fractures)   Hemoglobin A1c     Other   GAD (generalized anxiety disorder)    Improving.  Continue Pristiq 50 mg daily, sertraline 50 mg daily. Continue with psychiatry and therapy follow up.      Tension headache    Continued, occurring twice weekly. Discussed to start taking Topamax 25 mg HS consistently. She will update in a few weeks.       Moderate episode of recurrent major depressive disorder (HCC)    Improving.  Continue Pristiq 50 mg daily, sertraline 50 mg daily. Continue with psychiatry and therapy follow up.      Class 1 obesity due to excess calories with body mass index (BMI) of 32.0 to 32.9 in adult    Discussed the importance of a healthy diet and regular exercise in order for weight loss, and to reduce the risk of further co-morbidity.  Agree to start Ozempic  0.25 mg weekly x 4 weeks, then increase to 0.5 mg weekly thereafter.   Follow up in 3 months.       Relevant Medications   Semaglutide,0.25 or 0.5MG /DOS, (OZEMPIC, 0.25 OR 0.5 MG/DOSE,) 2 MG/3ML SOPN       Doreene Nest, NP

## 2022-04-21 NOTE — Assessment & Plan Note (Signed)
Controlled.  Reduce pantoprazole to 20 mg daily to see if she can tolerate. She will update if symptoms return.

## 2022-04-21 NOTE — Assessment & Plan Note (Signed)
Discussed the importance of a healthy diet and regular exercise in order for weight loss, and to reduce the risk of further co-morbidity.  Agree to start Ozempic 0.25 mg weekly x 4 weeks, then increase to 0.5 mg weekly thereafter.   Follow up in 3 months.

## 2022-04-21 NOTE — Assessment & Plan Note (Signed)
Continued, occurring twice weekly. Discussed to start taking Topamax 25 mg HS consistently. She will update in a few weeks.

## 2022-05-01 ENCOUNTER — Other Ambulatory Visit: Payer: Self-pay | Admitting: Primary Care

## 2022-05-01 DIAGNOSIS — R519 Headache, unspecified: Secondary | ICD-10-CM

## 2022-05-07 ENCOUNTER — Ambulatory Visit (INDEPENDENT_AMBULATORY_CARE_PROVIDER_SITE_OTHER): Payer: BC Managed Care – PPO | Admitting: Nurse Practitioner

## 2022-05-07 ENCOUNTER — Encounter: Payer: Self-pay | Admitting: Nurse Practitioner

## 2022-05-07 VITALS — BP 118/80 | HR 90 | Temp 98.7°F | Ht 62.0 in | Wt 178.8 lb

## 2022-05-07 DIAGNOSIS — G4489 Other headache syndrome: Secondary | ICD-10-CM

## 2022-05-07 DIAGNOSIS — R051 Acute cough: Secondary | ICD-10-CM

## 2022-05-07 DIAGNOSIS — J069 Acute upper respiratory infection, unspecified: Secondary | ICD-10-CM | POA: Insufficient documentation

## 2022-05-07 HISTORY — DX: Acute upper respiratory infection, unspecified: J06.9

## 2022-05-07 LAB — POC COVID19 BINAXNOW: SARS Coronavirus 2 Ag: NEGATIVE

## 2022-05-07 NOTE — Assessment & Plan Note (Signed)
Patient is having headaches.  Does have a headache syndrome that she is on topiramate for prophylaxis.  She can continue using over-the-counter analgesics as needed avoid ibuprofen with her cardiovascular history and risk and being on clopidogrel.  Can use Tylenol as needed as directed.  Rest push fluids.

## 2022-05-07 NOTE — Patient Instructions (Signed)
Nice to see you today Rest and drink plenty of fluids. Tylenol as needed for aches and pains STOP taking the psuedophed If the RSV comes back positive do not go to work on Monday If it is negative and you are feeling better and been fever free it is ok to go to work

## 2022-05-07 NOTE — Assessment & Plan Note (Signed)
COVID test negative in office.  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  Will send off RSV swab.  Patient was instructed if swab is positive to not go to work Monday and swab is negative if she is feeling better without fever she may return to work Monday with mask precautions.  Continue over-the-counter medications as beneficial rest, push fluids.  Continue Coricidin HBP did instruct patient to stop using Sudafed as she does have diagnosis of hypertension.  Okay to use Mucinex if needed

## 2022-05-07 NOTE — Progress Notes (Signed)
Acute Office Visit  Subjective:     Patient ID: Amber [name], female    DOB: 07/16/1970, 51 y.o.   MRN: [id number]  Chief Complaint  Patient presents with   Cough    dry   Nasal Congestion    Symptoms started Tuesday. 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  States that she has been taking Coricedin HBP, psuedophed and hot tea. Little relief   Review of Systems  Constitutional:  Positive for chills and malaise/fatigue. Negative for fever.  HENT:  Positive for ear pain (full feeling) and sinus pain. Negative for ear discharge and sore throat.   Respiratory:  Positive for cough and shortness of breath (at rest and DOE). Negative for sputum production.   Gastrointestinal:  Positive for abdominal pain. Negative for diarrhea, nausea and vomiting.  Musculoskeletal:  Negative for back pain and myalgias.  Neurological:  Positive for headaches.        Objective:    BP 118/80   Pulse 90   Temp 98.7 F (37.1 C) (Oral)   Ht 5\' 2"  (1.575 m)   Wt 178 lb 12.8 oz (81.1 kg)   SpO2 98%   BMI 32.70 kg/m    Physical Exam Vitals and nursing note reviewed.  HENT:     Right Ear: Tympanic membrane, ear canal and external ear normal.     Left Ear: Tympanic membrane, ear canal and external ear normal.  Cardiovascular:     Rate and Rhythm: Normal rate and regular rhythm.     Heart sounds: Normal heart sounds.  Pulmonary:     Effort: Pulmonary effort is normal.     Breath sounds: Normal breath sounds.  Lymphadenopathy:     Cervical: Cervical adenopathy present.  Neurological:     Mental Status: She is alert.     Results for orders placed or performed in visit on 05/07/22  POC COVID-19  Result Value Ref Range   SARS Coronavirus 2 Ag Negative Negative        Assessment &  Plan:   Problem List Items Addressed This Visit       Respiratory   Viral upper respiratory tract infection    COVID test negative in office.  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  Will send off RSV swab.  Patient was instructed if swab is positive to not go to work Monday and swab is negative if she is feeling better without fever she may return to work Monday with mask precautions.  Continue over-the-counter medications as beneficial rest, push fluids.  Continue Coricidin HBP did instruct patient to stop using Sudafed as she does have diagnosis of hypertension.  Okay to use Mucinex if needed        Other   Acute cough - Primary    Continues an over-the-counter antitussives as directed      Relevant Orders   POC COVID-19 (Completed)   RSV screen (nasopharyngeal)not at [facility]   Other headache syndrome    Patient is having headaches.  Does have a headache syndrome that she is on topiramate for prophylaxis.  She can continue using over-the-counter analgesics as needed avoid ibuprofen with her cardiovascular history and risk and being on clopidogrel.  Can use Tylenol as needed as directed.  Rest push fluids.      Relevant Orders   RSV screen (nasopharyngeal)not at [facility]    No orders of the defined types were placed in this encounter.   Return if symptoms worsen or fail to improve.  Audria Nine, NP

## 2022-05-07 NOTE — Assessment & Plan Note (Signed)
Continues an over-the-counter antitussives as directed

## 2022-05-08 LAB — RSV SCREEN (NASOPHARYNGEAL) NOT AT ARMC
MICRO NUMBER:: 14290242
SPECIMEN QUALITY:: ADEQUATE

## 2022-05-09 ENCOUNTER — Encounter: Payer: Self-pay | Admitting: Nurse Practitioner

## 2022-05-10 NOTE — Telephone Encounter (Signed)
If she can work from home it is ok to give her a letter due to her testing positive for RSV. If you see her response come through

## 2022-05-11 ENCOUNTER — Telehealth: Payer: Self-pay | Admitting: Nurse Practitioner

## 2022-05-11 DIAGNOSIS — R051 Acute cough: Secondary | ICD-10-CM

## 2022-05-11 MED ORDER — BENZONATATE 200 MG PO CAPS: 200.0000 mg | ORAL_CAPSULE | Freq: Three times a day (TID) | ORAL | 0 refills | Status: DC | PRN

## 2022-05-11 NOTE — Telephone Encounter (Signed)
I will send in some tessalon pearls to see if that helps

## 2022-05-11 NOTE — Telephone Encounter (Signed)
-----   Message from Donnamarie Poag, [place] sent at 05/10/2022  4:46 PM EST ----- Called patient she is feeling little better. She is still having non productive cough with congestion. She is taking otc meds but has not had any improvement with the congestion. But other than that feeling better. Denies any recent fevers. Wanted to know if there is anything other than the nose spray or mucinex dm that she can take for the dry cough.

## 2022-05-11 NOTE — Telephone Encounter (Signed)
Called and informed pt.  

## 2022-06-03 ENCOUNTER — Encounter: Payer: Self-pay | Admitting: Family Medicine

## 2022-06-03 ENCOUNTER — Ambulatory Visit (INDEPENDENT_AMBULATORY_CARE_PROVIDER_SITE_OTHER): Payer: BC Managed Care – PPO | Admitting: Family Medicine

## 2022-06-03 VITALS — BP 132/82 | HR 112 | Temp 97.7°F | Ht 62.0 in | Wt 176.0 lb

## 2022-06-03 DIAGNOSIS — R6889 Other general symptoms and signs: Secondary | ICD-10-CM

## 2022-06-03 DIAGNOSIS — R0981 Nasal congestion: Secondary | ICD-10-CM

## 2022-06-03 LAB — POC COVID19 BINAXNOW: SARS Coronavirus 2 Ag: NEGATIVE

## 2022-06-03 MED ORDER — OSELTAMIVIR PHOSPHATE 75 MG PO CAPS: 75.0000 mg | ORAL_CAPSULE | Freq: Two times a day (BID) | ORAL | 0 refills | Status: DC

## 2022-06-03 NOTE — Progress Notes (Signed)
Amber Ambriz T. Amber Guinta, MD, [place] at [facility] [place], [address]  Phone: [phone]  FAX: [phone]  Amber [name] - 52 y.o. female  MRN [id number]  Date of Birth: Mar 08, 1971  Date: 06/03/2022  PCP: Pleas Koch, NP  Referral: Pleas Koch, NP  Chief Complaint  Patient presents with   Cough    C/o cough, chest/head congestion, HA, hoarseness and body aches. Sxs started 06/01/22. H/o RSV.   Subjective:   Amber [name] is a 52 y.o. very pleasant female patient with Body mass index is 32.19 kg/m. who presents with the following:  Pleasant patient presents with some acute rhinorrhea, nasal congestion and chest congestion.  Coughing, HA, head congestion, head and chest congestion.   Having a little bit of aching. Body aches.  Diarrhea - little loose.  Twice today.  Appetite has decreased a lot.   Drinking water ok and orange juice. Works in [employer].   Last night felt really hot, but did not check her temp, but she thinks that she had a temperature.   She works in the [employer].   Review of Systems is noted in the HPI, as appropriate  Objective:   BP 132/82   Pulse (!) 112   Temp 97.7 F (36.5 C) (Temporal)   Ht 5\' 2"  (1.575 m)   Wt 176 lb (79.8 kg)   SpO2 98%   BMI 32.19 kg/m    Gen: WDWN, cooperative. Globally Non-toxic HEENT: Normocephalic and atraumatic. Throat clear, w/o exudate, R TM clear, L TM - good landmarks, No fluid present. rhinnorhea. No frontal or maxillary sinus T. MMM NECK: Anterior cervical  LAD is present CV: RRR, No M/G/R, cap refill <2 sec PULM: Breathing comfortably in no respiratory distress. no wheezing, crackles, rhonchi ABD: S,NT,ND,+BS. No HSM. No rebound. MSK: Nml gait   Laboratory and Imaging Data: Results for orders placed or performed in visit on 06/03/22  POC COVID-19 BinaxNow  Result Value Ref Range   SARS  Coronavirus 2 Ag Negative Negative     Assessment and Plan:     ICD-10-CM   1. Flu-like symptoms  R68.89     2. Nasal congestion  R09.81 POC COVID-19 BinaxNow     Flu-symptoms without Covid most likely is influenza, so I will treat as such with anti-virals and supportive care. (Flu tests on backorder.)  Medication Management during today's office visit: Meds ordered this encounter  Medications   oseltamivir (TAMIFLU) 75 MG capsule    Sig: Take 1 capsule (75 mg total) by mouth 2 (two) times daily.    Dispense:  10 capsule    Refill:  0   There are no discontinued medications.  Orders placed today for conditions managed today: Orders Placed This Encounter  Procedures   POC COVID-19 BinaxNow    Disposition: No follow-ups on file.  Dragon [employer] One speech-to-text software was used for transcription in this dictation.  Possible transcriptional errors can occur using Editor, commissioning.   Signed,  Amber Deed. Krishna Heuer, MD   Outpatient Encounter Medications as of 06/03/2022  Medication Sig   albuterol (VENTOLIN HFA) 108 (90 Base) MCG/ACT inhaler Inhale 2 puffs into the lungs every 4 (four) hours as needed for wheezing or shortness of breath (cough, shortness of breath or wheezing.).   Ascorbic Acid (VITAMIN C) 1000 MG tablet Take 1 tablet (1,000 mg total) by mouth daily.   azelastine (ASTELIN) 0.1 % nasal spray  Place 1 spray into both nostrils 2 (two) times daily. Use in each nostril as directed   benzonatate (TESSALON) 200 MG capsule Take 1 capsule (200 mg total) by mouth 3 (three) times daily as needed for cough.   cholecalciferol (VITAMIN D3) 25 MCG (1000 UNIT) tablet Take 1 tablet (1,000 Units total) by mouth daily.   clopidogrel (PLAVIX) 75 MG tablet Take 1 tablet by mouth once daily   desvenlafaxine (PRISTIQ) 50 MG 24 hr tablet Take 50 mg by mouth daily.   ezetimibe (ZETIA) 10 MG tablet TAKE 1 TABLET BY MOUTH ONCE DAILY FOR CHOLESTEROL   famotidine (PEPCID) 20 MG tablet Take  1 tablet (20 mg total) by mouth 2 (two) times daily. For heartburn.   hydrochlorothiazide (HYDRODIURIL) 12.5 MG tablet Take 1 tablet by mouth once daily for blood pressure   metFORMIN (GLUCOPHAGE-XR) 500 MG 24 hr tablet TAKE 1 TABLET BY MOUTH ONCE DAILY WITH BREAKFAST FOR DIABETES   metoprolol succinate (TOPROL XL) 25 MG 24 hr tablet Take 1 tablet (25 mg total) by mouth daily.   Multiple Vitamins-Minerals (MULTIVITAMIN WITH MINERALS) tablet Take 1 tablet by mouth daily.   oseltamivir (TAMIFLU) 75 MG capsule Take 1 capsule (75 mg total) by mouth 2 (two) times daily.   pantoprazole (PROTONIX) 20 MG tablet Take 1 tablet (20 mg total) by mouth daily. For heartburn   rosuvastatin (CRESTOR) 10 MG tablet Take 1 tablet (10 mg total) by mouth daily.   Semaglutide,0.25 or 0.5MG /DOS, (OZEMPIC, 0.25 OR 0.5 MG/DOSE,) 2 MG/3ML SOPN Inject 0.25 mg weekly x 4 weeks, then increase to 0.5 mg weekly thereafter for diabetes.   sertraline (ZOLOFT) 50 MG tablet Take 1 tablet (50 mg total) by mouth daily. for anxiety and depression.   topiramate (TOPAMAX) 25 MG tablet TAKE 1 TABLET BY MOUTH ONCE DAILY FOR  HEADACHE  PREVENTION   No facility-administered encounter medications on file as of 06/03/2022.

## 2022-06-04 ENCOUNTER — Encounter: Payer: Self-pay | Admitting: Family Medicine

## 2022-06-07 ENCOUNTER — Encounter: Payer: Self-pay | Admitting: Family Medicine

## 2022-06-07 NOTE — Telephone Encounter (Signed)
Can you help alter work not, so that she returns to work on Wednesday?

## 2022-06-14 ENCOUNTER — Other Ambulatory Visit
Admission: RE | Admit: 2022-06-14 | Discharge: 2022-06-14 | Disposition: A | Discharge status: Home / Self Care | Payer: BC Managed Care – PPO | Source: Ambulatory Visit | Attending: Physician Assistant | Admitting: Physician Assistant

## 2022-06-14 DIAGNOSIS — R7989 Other specified abnormal findings of blood chemistry: Secondary | ICD-10-CM | POA: Insufficient documentation

## 2022-06-14 DIAGNOSIS — E1169 Type 2 diabetes mellitus with other specified complication: Secondary | ICD-10-CM | POA: Diagnosis not present

## 2022-06-14 DIAGNOSIS — E785 Hyperlipidemia, unspecified: Secondary | ICD-10-CM | POA: Insufficient documentation

## 2022-06-14 LAB — LIPID PANEL
Cholesterol: 151 mg/dL (ref 0–200)
HDL: 63 mg/dL (ref >40)
LDL Cholesterol: 75 mg/dL (ref 0–99)
Total CHOL/HDL Ratio: 2.4 RATIO
Triglycerides: 66 mg/dL (ref <150)
VLDL: 13 mg/dL (ref 0–40)

## 2022-06-14 LAB — HEPATIC FUNCTION PANEL
ALT: 34 U/L (ref 0–44)
AST: 23 U/L (ref 15–41)
Albumin: 3.9 g/dL (ref 3.5–5.0)
Alkaline Phosphatase: 59 U/L (ref 38–126)
Bilirubin, Direct: 0.1 mg/dL (ref 0.0–0.2)
Indirect Bilirubin: 0.6 mg/dL (ref 0.3–0.9)
Total Bilirubin: 0.7 mg/dL (ref 0.3–1.2)
Total Protein: 7.8 g/dL (ref 6.5–8.1)

## 2022-06-24 ENCOUNTER — Ambulatory Visit (INDEPENDENT_AMBULATORY_CARE_PROVIDER_SITE_OTHER): Payer: BC Managed Care – PPO | Admitting: Primary Care

## 2022-06-24 ENCOUNTER — Encounter: Payer: Self-pay | Admitting: Primary Care

## 2022-06-24 VITALS — BP 124/82 | Temp 98.0°F | Ht 62.0 in | Wt 171.0 lb

## 2022-06-24 DIAGNOSIS — R197 Diarrhea, unspecified: Secondary | ICD-10-CM | POA: Diagnosis not present

## 2022-06-24 NOTE — Patient Instructions (Signed)
Have your HR department send me FMLA forms.  It was a pleasure to see you today!

## 2022-06-24 NOTE — Progress Notes (Addendum)
Subjective:    Patient ID: Amber [name], female    DOB: 1971/01/12, 52 y.o.   MRN: [id number]  Diarrhea  Pertinent negatives include no vomiting.    Amber [name] is a very pleasant 52 y.o. female with a history of recurrent strokes, IBS, hyperlipidemia, type 2 diabetes, hypertension who presents today to discuss diarrhea.   Evaluated by Dr. Lorelei Pont on 06/03/22 for "acute" rhinorrhea, nasal congestion, chest congestion, cough, headaches, and diarrhea. She tested negative for Covid-19 infection and was presumed to have influenza. She was treated with Tamiflu 75 mg BID x 5 days.   She was diagnosed with RSV in early December 2023, symptoms included cough, fatigue, wheezing. Was out of work for about 1 week.   Today she feeling better from her visit with Dr. Lorelei Pont. Those symptoms have resolved.   Yesterday she developed diarrhea, nausea, abdominal cramping, and generalized weakness. She woke up not feeling well but she proceeded to work per usual. Upon getting to work she had one episode of watery diarrhea. 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 She then had another episode of watery diarrhea. She proceeded to the school nurse who recommended she leave work due to her symptoms. She felt weak while driving home from work. She then slept from 10:30 am until 4 pm. Upon waking she had one small loose stool and some bloating.  Today she's had no diarrhea. She continues to feel weak overall. Her appetite has been decreased from Ozempic injections. 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 She feels that her immune system is down overall from her having RSV, Flu, and now these diarrheal symptoms. She's been sick off and on since early December 2023.  She is requesting medical leave to allow her body to heal properly. She would like her leave date to start 06/24/22 with a return date of  07/27/22.  She continues to grieve the loss of her brother who passed 11 months ago. She remains active with both psychiatry and therapy. She is making progress.    Review of Systems  Constitutional:  Positive for fatigue.  Gastrointestinal:  Positive for diarrhea and nausea. Negative for vomiting.         Past Medical History:  Diagnosis Date   Anxiety    self reported   Asthma    Depression    controlled   Diabetes mellitus without complication ([place])    Hyperlipidemia    controlled with medication   IBS (irritable bowel syndrome)    Slurred speech    Stroke (cerebrum) ([place]) 01/07/2021   Stroke ([place]) 03/13/2015   Tension headache 11/18/2020   Viral upper respiratory tract infection 05/07/2022   Word finding difficulty 02/27/2018    Social History   Socioeconomic History   Marital status: Single    Spouse name: Not on file   Number of children: Not on file   Years of education: Not on file   Highest education level: Not on file  Occupational History   Not on file  Tobacco Use   Smoking status: Never   Smokeless tobacco: Never  Vaping Use   Vaping Use: Never used  Substance and Sexual Activity   Alcohol use: Not Currently    Alcohol/week: 7.0 standard drinks of alcohol    Types: 7 Glasses of wine per week    Comment: red wine occasionally but not since stroke  Drug use: Not Currently    Types: Marijuana    Comment: edibles-twice weekly   Sexual activity: Not Currently  Other Topics Concern   Not on file  Social History Narrative   Originally from [place] lives up here in [place]   Has one daughter.   Enjoys spending time shopping and spending time with family    Social Determinants of Health   Financial Resource Strain: Not on file  Food Insecurity: Not on file  Transportation Needs: Not on file  Physical Activity: Not on file  Stress: Not on file  Social Connections: Not on file  Intimate Partner Violence: Not on file     Past Surgical History:  Procedure Laterality Date   ABDOMINAL HYSTERECTOMY  10/2006   bowel reconstruction  10/2006   with hysterectomy   CESAREAN SECTION  2005   EP IMPLANTABLE DEVICE N/A 03/17/2015   Procedure: Loop Recorder Insertion;  Surgeon: Will Meredith Leeds, MD;  Location: [facility];  Service: Cardiovascular;  Laterality: N/A;   TEE WITHOUT CARDIOVERSION N/A 03/17/2015   Procedure: TRANSESOPHAGEAL ECHOCARDIOGRAM (TEE);  Surgeon: Skeet Latch, MD;  Location: [facility];  Service: Cardiovascular;  Laterality: N/A;   TEE WITHOUT CARDIOVERSION N/A 01/06/2021   Procedure: TRANSESOPHAGEAL ECHOCARDIOGRAM (TEE);  Surgeon: Nelva Bush, MD;  Location: [facility];  Service: Cardiovascular;  Laterality: N/A;   TOE SURGERY     Left 2nd metatarsal    Family History  Problem Relation Age of Onset   Hyperlipidemia Mother    Heart failure Father    Arthritis Father    Diabetes Father    Hypertension Father    Hyperlipidemia Father    Prostate cancer Father    Heart attack Brother 37   Prostate cancer Maternal Uncle        x 3   Heart attack Maternal Grandfather    Hyperlipidemia Maternal Grandfather    Prostate cancer Maternal Grandfather    Diabetes Paternal Grandmother    Stroke Paternal Grandmother    Diabetes Paternal Grandfather    Heart attack Paternal Grandfather    Colon cancer Neg Hx    Colon polyps Neg Hx    Stomach cancer Neg Hx    Esophageal cancer Neg Hx    Pancreatic cancer Neg Hx     Allergies  Allergen Reactions   Cephalexin Hives and Rash    Can take Augmentin    Current Outpatient Medications on File Prior to Visit  Medication Sig Dispense Refill   albuterol (VENTOLIN HFA) 108 (90 Base) MCG/ACT inhaler Inhale 2 puffs into the lungs every 4 (four) hours as needed for wheezing or shortness of breath (cough, shortness of breath or wheezing.). 1 each 1   Ascorbic Acid (VITAMIN C) 1000 MG tablet Take 1 tablet (1,000 mg total) by mouth  daily. 30 tablet 0   azelastine (ASTELIN) 0.1 % nasal spray Place 1 spray into both nostrils 2 (two) times daily. Use in each nostril as directed 30 mL 0   benzonatate (TESSALON) 200 MG capsule Take 1 capsule (200 mg total) by mouth 3 (three) times daily as needed for cough. 21 capsule 0   cholecalciferol (VITAMIN D3) 25 MCG (1000 UNIT) tablet Take 1 tablet (1,000 Units total) by mouth daily. 30 tablet 0   clopidogrel (PLAVIX) 75 MG tablet Take 1 tablet by mouth once daily 90 tablet 1   desvenlafaxine (PRISTIQ) 50 MG 24 hr tablet Take 50 mg by mouth daily.  ezetimibe (ZETIA) 10 MG tablet TAKE 1 TABLET BY MOUTH ONCE DAILY FOR CHOLESTEROL 90 tablet 2   famotidine (PEPCID) 20 MG tablet Take 1 tablet (20 mg total) by mouth 2 (two) times daily. For heartburn. 60 tablet 0   hydrochlorothiazide (HYDRODIURIL) 12.5 MG tablet Take 1 tablet by mouth once daily for blood pressure 90 tablet 3   metFORMIN (GLUCOPHAGE-XR) 500 MG 24 hr tablet TAKE 1 TABLET BY MOUTH ONCE DAILY WITH BREAKFAST FOR DIABETES 90 tablet 1   metoprolol succinate (TOPROL XL) 25 MG 24 hr tablet Take 1 tablet (25 mg total) by mouth daily. 30 tablet 1   Multiple Vitamins-Minerals (MULTIVITAMIN WITH MINERALS) tablet Take 1 tablet by mouth daily.     pantoprazole (PROTONIX) 20 MG tablet Take 1 tablet (20 mg total) by mouth daily. For heartburn 90 tablet 0   rosuvastatin (CRESTOR) 10 MG tablet Take 1 tablet (10 mg total) by mouth daily. 90 tablet 1   Semaglutide,0.25 or 0.5MG /DOS, (OZEMPIC, 0.25 OR 0.5 MG/DOSE,) 2 MG/3ML SOPN Inject 0.25 mg weekly x 4 weeks, then increase to 0.5 mg weekly thereafter for diabetes. 9 mL 0   sertraline (ZOLOFT) 50 MG tablet Take 1 tablet (50 mg total) by mouth daily. for anxiety and depression. 90 tablet 1   topiramate (TOPAMAX) 25 MG tablet TAKE 1 TABLET BY MOUTH ONCE DAILY FOR  HEADACHE  PREVENTION 90 tablet 3   No current facility-administered medications on file prior to visit.    BP 124/82   Temp 98 F  (36.7 C) (Temporal)   Ht 5\' 2"  (1.575 m)   Wt 171 lb (77.6 kg)   BMI 31.28 kg/m  Objective:   Physical Exam Constitutional:      Appearance: She is not ill-appearing.  Cardiovascular:     Rate and Rhythm: Normal rate and regular rhythm.  Pulmonary:     Effort: Pulmonary effort is normal.     Breath sounds: Normal breath sounds.  Abdominal:     General: There is no distension.     Palpations: Abdomen is soft.     Tenderness: There is abdominal tenderness in the suprapubic area.     Comments: Mild tenderness upon deep palpation to mid lower abdomen  Musculoskeletal:     Cervical back: Neck supple.  Skin:    General: Skin is warm and dry.           Assessment & Plan:  Acute diarrhea Assessment & Plan: Improving which is reassuring. Fortunately, she is able to hydrate well and is cognizant of this.   Since she is improving we will forego labs today, she agrees.  Agree to support her FMLA with start date of 06/24/22 with a return date of 07/27/22 for immune support and rest, especially given her extensive medical history. Letter provided today.    35 minutes spent face-to-face with patient discussing concerns and developing plan for medical leave. See assessment and plan.     07/29/22, NP

## 2022-06-24 NOTE — Assessment & Plan Note (Addendum)
Improving which is reassuring. Fortunately, she is able to hydrate well and is cognizant of this.   Since she is improving we will forego labs today, she agrees.  Agree to support her FMLA with start date of 06/24/22 with a return date of 07/27/22 for immune support and rest, especially given her extensive medical history. Letter provided today.

## 2022-07-05 DIAGNOSIS — E119 Type 2 diabetes mellitus without complications: Secondary | ICD-10-CM | POA: Diagnosis not present

## 2022-07-06 ENCOUNTER — Telehealth: Payer: Self-pay

## 2022-07-06 NOTE — Telephone Encounter (Signed)
Completed and placed on Kate's desk 

## 2022-07-06 NOTE — Telephone Encounter (Signed)
We received a disability benefits form for patient.  This has been placed in your inbox for completion and signing.  No due date listed.

## 2022-07-06 NOTE — Telephone Encounter (Signed)
Anda Kraft, have you received additional FMLA paperwork for this patient? I remember signing a form last week but no formal FMLA paperwork.

## 2022-07-07 ENCOUNTER — Other Ambulatory Visit: Payer: Self-pay | Admitting: Primary Care

## 2022-07-07 DIAGNOSIS — E119 Type 2 diabetes mellitus without complications: Secondary | ICD-10-CM

## 2022-07-07 DIAGNOSIS — E785 Hyperlipidemia, unspecified: Secondary | ICD-10-CM

## 2022-07-07 NOTE — Telephone Encounter (Signed)
Completed form faxed to fax# [phone].  Received fax confirmation.  Copies made for patient, scanning, and myself.  Sent note to patient via mychart to see how she would like to receive her copy.

## 2022-07-10 DIAGNOSIS — F330 Major depressive disorder, recurrent, mild: Secondary | ICD-10-CM | POA: Diagnosis not present

## 2022-07-22 ENCOUNTER — Telehealth: Payer: Self-pay | Admitting: Primary Care

## 2022-07-22 NOTE — Telephone Encounter (Signed)
Type of forms received:Short term disability   Routed to:K Fontanelle received by : Gwynn Burly   Individual made aware of 3-5 business day turn around (Y/N): Y  Form completed and patient made aware of charges(Y/N): Y   Faxed to :   Form location: Place in [place]

## 2022-07-22 NOTE — Telephone Encounter (Signed)
Form completed and ready for pickup.  Placed on [employer].

## 2022-07-22 NOTE — Telephone Encounter (Signed)
Patient dropped off [employer] for [employer] form.  This has been placed in your inbox for completion and signing.  No due date listed.  See note from patient attached to form.  Patient will pick up copy when complete.

## 2022-07-23 NOTE — Telephone Encounter (Signed)
Copies of completed form made for patient, scanning, and myself.  Patient's copy placed in front office for pickup.  Patient notified by phone that this is ready.  Patient will turn in to her employer after filling out her section.

## 2022-08-04 NOTE — Telephone Encounter (Signed)
Unable to reach patient. Left voicemail to return call to office to schedule appointment if she is still not feeling well.

## 2022-08-06 ENCOUNTER — Encounter: Payer: Self-pay | Admitting: Adult Health

## 2022-08-06 ENCOUNTER — Ambulatory Visit (INDEPENDENT_AMBULATORY_CARE_PROVIDER_SITE_OTHER): Payer: BC Managed Care – PPO | Admitting: Adult Health

## 2022-08-06 VITALS — BP 110/88 | HR 70 | Temp 97.9°F | Ht 62.0 in | Wt 169.0 lb

## 2022-08-06 DIAGNOSIS — M545 Low back pain, unspecified: Secondary | ICD-10-CM

## 2022-08-06 DIAGNOSIS — R34 Anuria and oliguria: Secondary | ICD-10-CM | POA: Diagnosis not present

## 2022-08-06 LAB — POCT URINALYSIS DIPSTICK
Bilirubin, UA: NEGATIVE
Blood, UA: NEGATIVE
Glucose, UA: NEGATIVE
Ketones, UA: NEGATIVE
Leukocytes, UA: NEGATIVE
Nitrite, UA: NEGATIVE
Protein, UA: NEGATIVE
Spec Grav, UA: 1.01 (ref 1.010–1.025)
Urobilinogen, UA: 0.2 E.U./dL
pH, UA: 7.5 (ref 5.0–8.0)

## 2022-08-06 MED ORDER — CYCLOBENZAPRINE HCL 10 MG PO TABS: 10.0000 mg | ORAL_TABLET | Freq: Three times a day (TID) | ORAL | 0 refills | Status: DC | PRN

## 2022-08-06 NOTE — Progress Notes (Signed)
Subjective:    Patient ID: Amber [name], female    DOB: August 24, 1970, 52 y.o.   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  She is a patient of Alma Friendly, NP who I am seeing today for an acute issue.   Last Tuesday she took her Ozempic shot as she normally does, the next day she started to have diarrhea for about two days. She has been hydrating as much as possible but she reports that she is not urinating as much as she is accustomed too. She denies any dysuria or hematuria.   She also reports low back pain x 1 month. She is unsure if she lifted anything heavy to aggravate her low back. Pain is worse with changing positions, bending and twisting.    Review of Systems See HPI   Past Medical History:  Diagnosis Date   Anxiety    self reported   Asthma    Depression    controlled   Diabetes mellitus without complication ([place])    Hyperlipidemia    controlled with medication   IBS (irritable bowel syndrome)    Slurred speech    Stroke (cerebrum) ([place]) 01/07/2021   Stroke ([place]) 03/13/2015   Tension headache 11/18/2020   Viral upper respiratory tract infection 05/07/2022   Word finding difficulty 02/27/2018    Social History   Socioeconomic History   Marital status: Single    Spouse name: Not on file   Number of children: Not on file   Years of education: Not on file   Highest education level: Not on file  Occupational History   Not on file  Tobacco Use   Smoking status: Never   Smokeless tobacco: Never  Vaping Use   Vaping Use: Never used  Substance and Sexual Activity   Alcohol use: Not  Currently    Alcohol/week: 7.0 standard drinks of alcohol    Types: 7 Glasses of wine per week    Comment: red wine occasionally but not since stroke   Drug use: Not Currently    Types: Marijuana    Comment: edibles-twice weekly   Sexual activity: Not Currently  Other Topics Concern   Not on file  Social History Narrative   Originally from [place] lives up here in [place]   Has one daughter.   Enjoys spending time shopping and spending time with family    Social Determinants of Health   Financial Resource Strain: Not on file  Food Insecurity: Not on file  Transportation Needs: Not on file  Physical Activity: Not on file  Stress: Not on file  Social Connections: Not on file  Intimate Partner Violence: Not on file    Past Surgical History:  Procedure Laterality Date   ABDOMINAL HYSTERECTOMY  10/2006   bowel reconstruction  10/2006   with hysterectomy   CESAREAN SECTION  2005   EP IMPLANTABLE DEVICE N/A 03/17/2015   Procedure: Loop Recorder Insertion;  Surgeon: Will Meredith Leeds, MD;  Location: [facility];  Service: Cardiovascular;  Laterality: N/A;   TEE WITHOUT CARDIOVERSION N/A 03/17/2015   Procedure: TRANSESOPHAGEAL ECHOCARDIOGRAM (TEE);  Surgeon: Skeet Latch, MD;  Location: [facility];  Service: Cardiovascular;  Laterality:  N/A;   TEE WITHOUT CARDIOVERSION N/A 01/06/2021   Procedure: TRANSESOPHAGEAL ECHOCARDIOGRAM (TEE);  Surgeon: Nelva Bush, MD;  Location: [facility];  Service: Cardiovascular;  Laterality: N/A;   TOE SURGERY     Left 2nd metatarsal    Family History  Problem Relation Age of Onset   Hyperlipidemia Mother    Heart failure Father    Arthritis Father    Diabetes Father    Hypertension Father    Hyperlipidemia Father    Prostate cancer Father    Heart attack Brother 35   Prostate cancer Maternal Uncle        x 3   Heart attack Maternal Grandfather    Hyperlipidemia Maternal Grandfather    Prostate cancer  Maternal Grandfather    Diabetes Paternal Grandmother    Stroke Paternal Grandmother    Diabetes Paternal Grandfather    Heart attack Paternal Grandfather    Colon cancer Neg Hx    Colon polyps Neg Hx    Stomach cancer Neg Hx    Esophageal cancer Neg Hx    Pancreatic cancer Neg Hx     Allergies  Allergen Reactions   Cephalexin Hives and Rash    Can take Augmentin    Current Outpatient Medications on File Prior to Visit  Medication Sig Dispense Refill   albuterol (VENTOLIN HFA) 108 (90 Base) MCG/ACT inhaler Inhale 2 puffs into the lungs every 4 (four) hours as needed for wheezing or shortness of breath (cough, shortness of breath or wheezing.). 1 each 1   Ascorbic Acid (VITAMIN C) 1000 MG tablet Take 1 tablet (1,000 mg total) by mouth daily. 30 tablet 0   azelastine (ASTELIN) 0.1 % nasal spray Place 1 spray into both nostrils 2 (two) times daily. Use in each nostril as directed 30 mL 0   benzonatate (TESSALON) 200 MG capsule Take 1 capsule (200 mg total) by mouth 3 (three) times daily as needed for cough. 21 capsule 0   cholecalciferol (VITAMIN D3) 25 MCG (1000 UNIT) tablet Take 1 tablet (1,000 Units total) by mouth daily. 30 tablet 0   clopidogrel (PLAVIX) 75 MG tablet Take 1 tablet by mouth once daily 90 tablet 1   desvenlafaxine (PRISTIQ) 50 MG 24 hr tablet Take 50 mg by mouth daily.     ezetimibe (ZETIA) 10 MG tablet TAKE 1 TABLET BY MOUTH ONCE DAILY FOR CHOLESTEROL 90 tablet 1   famotidine (PEPCID) 20 MG tablet Take 1 tablet (20 mg total) by mouth 2 (two) times daily. For heartburn. 60 tablet 0   hydrochlorothiazide (HYDRODIURIL) 12.5 MG tablet Take 1 tablet by mouth once daily for blood pressure 90 tablet 3   metFORMIN (GLUCOPHAGE-XR) 500 MG 24 hr tablet TAKE 1 TABLET BY MOUTH ONCE DAILY WITH BREAKFAST FOR DIABETES 90 tablet 1   metoprolol succinate (TOPROL XL) 25 MG 24 hr tablet Take 1 tablet (25 mg total) by mouth daily. 30 tablet 1   Multiple Vitamins-Minerals (MULTIVITAMIN  WITH MINERALS) tablet Take 1 tablet by mouth daily.     pantoprazole (PROTONIX) 20 MG tablet Take 1 tablet (20 mg total) by mouth daily. For heartburn 90 tablet 0   rosuvastatin (CRESTOR) 10 MG tablet Take 1 tablet (10 mg total) by mouth daily. 90 tablet 1   Semaglutide,0.25 or 0.'5MG'$ /DOS, (OZEMPIC, 0.25 OR 0.5 MG/DOSE,) 2 MG/3ML SOPN Inject 0.25 mg weekly x 4 weeks, then increase to 0.5 mg weekly thereafter for diabetes. 9 mL 0   sertraline (ZOLOFT) 50 MG tablet Take 1 tablet (  50 mg total) by mouth daily. for anxiety and depression. 90 tablet 1   topiramate (TOPAMAX) 25 MG tablet TAKE 1 TABLET BY MOUTH ONCE DAILY FOR  HEADACHE  PREVENTION 90 tablet 3   No current facility-administered medications on file prior to visit.    BP 110/88   Pulse 70   Temp 97.9 F (36.6 C) (Oral)   Ht '5\' 2"'$  (1.575 m)   Wt 169 lb (76.7 kg)   SpO2 97%   BMI 30.91 kg/m       Objective:   Physical Exam Vitals and nursing note reviewed.  Constitutional:      Appearance: Normal appearance.  Cardiovascular:     Rate and Rhythm: Normal rate and regular rhythm.     Pulses: Normal pulses.     Heart sounds: Normal heart sounds.  Pulmonary:     Effort: Pulmonary effort is normal.     Breath sounds: Normal breath sounds.  Abdominal:     General: Abdomen is flat. Bowel sounds are normal. There is no distension.     Palpations: Abdomen is soft.     Tenderness: There is no abdominal tenderness. There is no right CVA tenderness or left CVA tenderness.  Musculoskeletal:        General: Normal range of motion.     Lumbar back: Spasms and tenderness present. No bony tenderness. Normal range of motion.  Skin:    General: Skin is warm and dry.  Neurological:     General: No focal deficit present.     Mental Status: She is alert and oriented to person, place, and time.  Psychiatric:        Mood and Affect: Mood normal.        Behavior: Behavior normal.        Thought Content: Thought content normal.         Judgment: Judgment normal.        Assessment & Plan:  1. Decreased urination -  - POC Urinalysis Dipstick- negative  - likely still volume depleted from GI illness. Continue to work on getting hydrated - Follow up with PCP if no better  2. Acute bilateral low back pain without sciatica - She is aware of sedating effects of flexeril. Advised to try using at night until she knows how it will effect her.  - Massage would be helpful  - cyclobenzaprine (FLEXERIL) 10 MG tablet; Take 1 tablet (10 mg total) by mouth 3 (three) times daily as needed for muscle spasms.  Dispense: 30 tablet; Refill: 0  Dorothyann Peng, NP

## 2022-08-09 ENCOUNTER — Ambulatory Visit: Payer: BC Managed Care – PPO | Admitting: Family

## 2022-08-12 NOTE — Progress Notes (Signed)
Cardiology Office Note    Date:  08/13/2022   ID:  Amber [name], DOB 1971/05/23, MRN [id number]  PCP:  Pleas Koch, NP  Cardiologist:  Nelva Bush, MD  Electrophysiologist:  None   Chief Complaint: Follow up  History of Present Illness:   Amber [name] is a 52 y.o. female with history of recurrent strokes, DM2, HLD, Covid in 04/2021, asthma, IBS, and anxiety who presents for follow up of HLD.   She was admitted in 2016 with a left MCA infarct with an embolic pattern secondary to unknown source.  2D surface echo at that time demonstrated an EF of 50%, no regional wall motion abnormalities, grade 1 diastolic dysfunction, mild mitral regurgitation, normal-sized left atrium, normal RV systolic function and ventricular cavity size.  TEE revealed an EF of 45 to 50%, diffuse global hypokinesis, trivial mitral regurgitation, no PFO or ASD, no left atrial or left atrial appendage thrombus.  Transcranial Doppler was unrevealing.  She subsequently underwent ILR implantation.  Device interrogation has demonstrated no evidence of significant arrhythmia.  She was readmitted in 05/2016 with CVA with echo at that time demonstrating an EF of 55 to 60%, normal wall motion, normal LV diastolic function parameters, no PFO.  She was admitted in 12/2020 with recurrent stroke.  Repeat TEE showed no intracardiac thrombus or shunt.  Linear echodensity in the ascending aorta was noted and felt to most likely be artifact.  Subsequent CTA of the chest showed no significant abnormality.  She reestablished care with our office in 01/2021 for evaluation of chest pain.  She had recently visited the ED, and left without being seen due to prolonged wait and symptomatic improvement.  Subsequent coronary CTA on 02/12/2021 showed a calcium score of 0 with no evidence of CAD.  She was referred to EP for consideration of repeat ILR implantation.  She was seen in the office in 02/2021 and was doing fairly well from a cardiac  perspective.  She did report occasional palpitations that were improved when compared to her prior visit.  Initially, she was scheduled to see EP for consideration of loop recorder, though this appointment was canceled as precertification was not obtained.  She was subsequently reluctant to proceed with loop recorder reimplantation.  It was also noted recent carotid Doppler showed only mild right internal carotid artery stenosis.  She wished to think about further outpatient cardiac monitoring.  She was seen in the office in 05/2021 and was without symptoms of angina or decompensation.  She was planning to resume work at a part-time basis.  She preferred to continue to defer implantation of ILR.  Outpatient cardiac monitoring x 2 showed predominantly sinus rhythm with rare PACs and PVCs with no significant arrhythmia.  Repeat outpatient cardiac monitoring again demonstrated predominantly sinus rhythm with rare PACs and PVCs, as well as a single brief episode of PSVT lasting just 5 beats.  No evidence of A-fib/flutter.  She was seen in the office in 10/2021 and was without symptoms of angina or decompensation.  She had undergone abdominal ultrasound for elevated LFTs, which was unrevealing.  Rosuvastatin was held, and she was continued on ezetimibe.  Subsequent LFTs were noted to be normal in 12/2021.  She was last seen in the office in 01/2022 and remained without symptoms of angina or cardiac decompensation.  No new CVA symptoms.  She did note fatigue and was napping frequently.  It was noted prior sleep study in 2019 was negative.  Follow-up LFTs, off statin therapy  showed a reasonably controlled LDL of 75 as outlined below.  She comes in doing well from a cardiac perspective and is without symptoms of angina or cardiac decompensation.  No palpitations, dyspnea, lower extremity swelling, or progressive orthopnea.  Since that she was last seen, she was diagnosed with RSV and influenza and has recovered well from  these.  Her fatigue and mental status are improving.  She continues to have a robust support system surrounding her.  Her weight is down approximately 10 pounds by her scale following the initiation of Ozempic.  With this, she has noted some underlying constipation.  Overall, she feels like she is doing well from a cardiac perspective.   Labs independently reviewed: 05/2022 - TC 151, TG 66, HDL 63, LDL 75, albumin 3.9, AST/ALT normal 03/2022 - A1c 6.4 10/2021 - potassium 4.3, BUN 13, serum creatinine 0.97 05/2021 - HGB 14.7, PLT 249 12/2020 - magnesium 2.1 08/2020 - TSH normal  Past Medical History:  Diagnosis Date   Anxiety    self reported   Asthma    Depression    controlled   Diabetes mellitus without complication ([place])    Hyperlipidemia    controlled with medication   IBS (irritable bowel syndrome)    Slurred speech    Stroke (cerebrum) ([place]) 01/07/2021   Stroke ([place]) 03/13/2015   Tension headache 11/18/2020   Viral upper respiratory tract infection 05/07/2022   Word finding difficulty 02/27/2018    Past Surgical History:  Procedure Laterality Date   ABDOMINAL HYSTERECTOMY  10/2006   bowel reconstruction  10/2006   with hysterectomy   CESAREAN SECTION  2005   EP IMPLANTABLE DEVICE N/A 03/17/2015   Procedure: Loop Recorder Insertion;  Surgeon: Will Meredith Leeds, MD;  Location: [facility];  Service: Cardiovascular;  Laterality: N/A;   TEE WITHOUT CARDIOVERSION N/A 03/17/2015   Procedure: TRANSESOPHAGEAL ECHOCARDIOGRAM (TEE);  Surgeon: Skeet Latch, MD;  Location: [facility];  Service: Cardiovascular;  Laterality: N/A;   TEE WITHOUT CARDIOVERSION N/A 01/06/2021   Procedure: TRANSESOPHAGEAL ECHOCARDIOGRAM (TEE);  Surgeon: Nelva Bush, MD;  Location: [facility];  Service: Cardiovascular;  Laterality: N/A;   TOE SURGERY     Left 2nd metatarsal    Current Medications: Current Meds  Medication Sig   albuterol (VENTOLIN HFA) 108 (90 Base) MCG/ACT inhaler Inhale  2 puffs into the lungs every 4 (four) hours as needed for wheezing or shortness of breath (cough, shortness of breath or wheezing.).   Ascorbic Acid (VITAMIN C) 1000 MG tablet Take 1 tablet (1,000 mg total) by mouth daily.   cholecalciferol (VITAMIN D3) 25 MCG (1000 UNIT) tablet Take 1 tablet (1,000 Units total) by mouth daily.   clopidogrel (PLAVIX) 75 MG tablet Take 1 tablet by mouth once daily   cyclobenzaprine (FLEXERIL) 10 MG tablet Take 1 tablet (10 mg total) by mouth 3 (three) times daily as needed for muscle spasms.   desvenlafaxine (PRISTIQ) 50 MG 24 hr tablet Take 50 mg by mouth daily.   ezetimibe (ZETIA) 10 MG tablet TAKE 1 TABLET BY MOUTH ONCE DAILY FOR CHOLESTEROL   hydrochlorothiazide (HYDRODIURIL) 12.5 MG tablet Take 1 tablet by mouth once daily for blood pressure   metFORMIN (GLUCOPHAGE-XR) 500 MG 24 hr tablet TAKE 1 TABLET BY MOUTH ONCE DAILY WITH BREAKFAST FOR DIABETES   metoprolol succinate (TOPROL XL) 25 MG 24 hr tablet Take 1 tablet (25 mg total) by mouth daily.   Multiple Vitamins-Minerals (MULTIVITAMIN WITH MINERALS) tablet Take 1 tablet by mouth daily.  pantoprazole (PROTONIX) 20 MG tablet Take 1 tablet (20 mg total) by mouth daily. For heartburn   rosuvastatin (CRESTOR) 10 MG tablet Take 1 tablet (10 mg total) by mouth daily.   Semaglutide,0.25 or 0.5MG /DOS, (OZEMPIC, 0.25 OR 0.5 MG/DOSE,) 2 MG/3ML SOPN Inject 0.25 mg weekly x 4 weeks, then increase to 0.5 mg weekly thereafter for diabetes.   topiramate (TOPAMAX) 25 MG tablet TAKE 1 TABLET BY MOUTH ONCE DAILY FOR  HEADACHE  PREVENTION   TRINTELLIX 20 MG TABS tablet Take 20 mg by mouth daily.    Allergies:   Cephalexin   Social History   Socioeconomic History   Marital status: Single    Spouse name: Not on file   Number of children: Not on file   Years of education: Not on file   Highest education level: Not on file  Occupational History   Not on file  Tobacco Use   Smoking status: Never   Smokeless tobacco:  Never  Vaping Use   Vaping Use: Never used  Substance and Sexual Activity   Alcohol use: Not Currently    Alcohol/week: 7.0 standard drinks of alcohol    Types: 7 Glasses of wine per week    Comment: red wine occasionally but not since stroke   Drug use: Not Currently    Types: Marijuana    Comment: edibles-twice weekly   Sexual activity: Not Currently  Other Topics Concern   Not on file  Social History Narrative   Originally from [place] lives up here in [place]   Has one daughter.   Enjoys spending time shopping and spending time with family    Social Determinants of Health   Financial Resource Strain: Not on file  Food Insecurity: Not on file  Transportation Needs: Not on file  Physical Activity: Not on file  Stress: Not on file  Social Connections: Not on file     Family History:  The patient's family history includes Arthritis in her father; Diabetes in her father, paternal grandfather, and paternal grandmother; Heart attack in her maternal grandfather and paternal grandfather; Heart attack (age of onset: 46) in her brother; Heart failure in her father; Hyperlipidemia in her father, maternal grandfather, and mother; Hypertension in her father; Prostate cancer in her father, maternal grandfather, and maternal uncle; Stroke in her paternal grandmother. There is no history of Colon cancer, Colon polyps, Stomach cancer, Esophageal cancer, or Pancreatic cancer.  ROS:   12-point review of systems is negative unless otherwise noted in the HPI.   EKGs/Labs/Other Studies Reviewed:    Studies reviewed were summarized above. The additional studies were reviewed today:  2D echo 03/14/2015: - Left ventricle: The cavity size was normal. Systolic function was    normal. The estimated ejection fraction was 50%. Wall motion was    normal; there were no regional wall motion abnormalities. Doppler    parameters are consistent with abnormal left ventricular     relaxation (grade 1 diastolic dysfunction). Doppler parameters    are consistent with elevated ventricular end-diastolic filling    pressure.  - Aortic valve: Structurally normal valve. There was no    regurgitation.  - Mitral valve: Structurally normal valve. There was mild    regurgitation.  - Left atrium: The atrium was normal in size.  - Right ventricle: The cavity size was normal. Wall thickness was    normal. Systolic function was normal.  - Right atrium: The atrium was normal in size.  - Tricuspid  valve: There was mild regurgitation.  - Pulmonic valve: There was trivial regurgitation.  - Inferior vena cava: The vessel was normal in size. The    respirophasic diameter changes were in the normal range (>= 50%),    consistent with normal central venous pressure.  - Pericardium, extracardiac: The pericardium was normal in    appearance.   Impressions:   - LVEF is mildly impaired with diffuse hypokinesis.    Abnormal relaxation with mildly elevated filling pressures.    Mild MR and TR. __________   Transcranial doppler with bubbles 03/14/2015: Negative TCD Bubble study.No high intensity transient signals  (HITS) heard at rest or with valsalva. Therefore, there is no  apparent PFO. __________   TEE 03/17/2015: - Left ventricle: Systolic function was mildly reduced. The    estimated ejection fraction was in the range of 45% to 50%.    Diffuse hypokinesis.  - Left atrium: No evidence of thrombus in the atrial cavity or    appendage.  - Right atrium: No evidence of thrombus in the atrial cavity or    appendage.  - Atrial septum: No defect or patent foramen ovale was identified.    Echo contrast study showed no right-to-left atrial level shunt,    at baseline or with provocation. Negative saline microcavitation    study. ___________   2D echo 06/11/2016: - Left ventricle: The cavity size was normal. Wall thickness was    normal. Systolic function was normal. The estimated  ejection    fraction was in the range of 55% to 60%. Wall motion was normal;    there were no regional wall motion abnormalities. Left    ventricular diastolic function parameters were normal.  - Atrial septum: No defect or patent foramen ovale was identified.   Impressions:   - No cardiac source of emboli was indentified. __________   2D echo 01/04/2021: 1. Left ventricular ejection fraction, by estimation, is 55 to 60%. The  left ventricle has normal function. The left ventricle has no regional  wall motion abnormalities. Left ventricular diastolic parameters are  consistent with Grade I diastolic  dysfunction (impaired relaxation).   2. Right ventricular systolic function is normal. The right ventricular  size is normal.   3. The mitral valve is degenerative. Mild mitral valve regurgitation.   4. The aortic valve is tricuspid. Aortic valve regurgitation is not  visualized.   5. Agitated saline contrast bubble study was negative, with no evidence  of any interatrial shunt. __________   TEE 01/06/2021: 1. Left ventricular ejection fraction, by estimation, is 55 to 60%. The  left ventricle has normal function.   2. Right ventricular systolic function is normal. The right ventricular  size is normal.   3. No left atrial/left atrial appendage thrombus was detected. The LAA  emptying velocity was 76 cm/s.   4. The mitral valve is abnormal. Mild mitral valve regurgitation.   5. The aortic valve is tricuspid. Aortic valve regurgitation is not  visualized.   6. There is a linear echodensity in and adjacent to the ascending aorta  that most likely represents artifact. However, given cryptogenic strokes,  further evaluation with CTA could be helpful to exclude aortic pathology.   7. Agitated saline contrast bubble study was negative, with no evidence  of any interatrial shunt. __________   Coronary artery CTA 02/12/2021: FINDINGS: Aorta:  Normal size.  No calcifications.  No  dissection.   Aortic Valve:  Trileaflet.  No calcifications.   Coronary Arteries:  Normal coronary origin.  Right dominance.   RCA is a dominant artery that gives rise to PDA and PLA. There is no plaque.   Left main is a large artery that gives rise to LAD and LCX arteries.   LAD has no plaque.   LCX is a non-dominant artery that gives rise to two obtuse marginal branches. There is no plaque.   Other findings:   Normal pulmonary vein drainage into the left atrium.   Normal left atrial appendage without a thrombus.   Normal size of the pulmonary artery.   IMPRESSION: 1. Normal coronary calcium score of 0. Patient is low risk for coronary events. 2. Normal coronary origin with right dominance. 3. No evidence of CAD. 4. CAD-RADS 0. Consider non-atherosclerotic causes of chest pain. __________   Elwyn Reach patch 05/2021: The patient was monitored for 14 days. The predominant rhythm was sinus with an average rate of 87 bpm (range 52-161 bpm). There were rare PACs and PVCs. No sustained arrhythmia or prolonged pause was identified. Single patient triggered event corresponds to normal sinus rhythm.   Predominantly sinus rhythm with rare PACs and PVCs.  No significant arrhythmia identified. __________   Elwyn Reach patch 05/2021: The patient was monitored for 13 days, 9 hours. The predominant rhythm was sinus with an average rate of 90 bpm (range 51-184 bpm in sinus). There were rare PACs and PVCs. Single atrial run lasting 5 beats occurred with a maximum rate of 193 bpm. No sustained arrhythmia or prolonged pause was observed. Patient triggered event corresponds to sinus rhythm with artifact and PVC.   Predominantly sinus rhythm with rare PACs and PVCs as well as single brief episode of PSVT.  No atrial fibrillation/flutter observed.   EKG:  EKG is ordered today.  The EKG ordered today demonstrates NSR, 74 bpm, rare PVC, baseline artifact, consistent with prior tracing  Recent  Labs: 10/30/2021: BUN 13; Creatinine, Ser 0.97; Potassium 4.3; Sodium 141 06/14/2022: ALT 34  Recent Lipid Panel    Component Value Date/Time   CHOL 151 06/14/2022 1512   CHOL 184 08/28/2019 1537   TRIG 66 06/14/2022 1512   HDL 63 06/14/2022 1512   HDL 75 08/28/2019 1537   CHOLHDL 2.4 06/14/2022 1512   VLDL 13 06/14/2022 1512   LDLCALC 75 06/14/2022 1512   LDLCALC 85 08/28/2019 1537   LDLDIRECT 160 (H) 02/24/2022 1651    PHYSICAL EXAM:    VS:  BP 116/82 (BP Location: Left Arm, Patient Position: Sitting, Cuff Size: Normal)   Pulse 74   Ht 5\' 2"  (1.575 m)   Wt 170 lb 12.8 oz (77.5 kg)   SpO2 97%   BMI 31.24 kg/m   BMI: Body mass index is 31.24 kg/m.  Physical Exam Vitals reviewed.  Constitutional:      Appearance: She is well-developed.  HENT:     Head: Normocephalic and atraumatic.  Eyes:     General:        Right eye: No discharge.        Left eye: No discharge.  Neck:     Vascular: No JVD.  Cardiovascular:     Rate and Rhythm: Normal rate and regular rhythm.     Heart sounds: Normal heart sounds, S1 normal and S2 normal. Heart sounds not distant. No midsystolic click and no opening snap. No murmur heard.    No friction rub.  Pulmonary:     Effort: Pulmonary effort is normal. No respiratory distress.     Breath sounds: Normal  breath sounds. No decreased breath sounds, wheezing or rales.  Chest:     Chest wall: No tenderness.  Abdominal:     General: There is no distension.  Musculoskeletal:     Cervical back: Normal range of motion.  Skin:    General: Skin is warm and dry.     Nails: There is no clubbing.  Neurological:     Mental Status: She is alert and oriented to person, place, and time.  Psychiatric:        Speech: Speech normal.        Behavior: Behavior normal.        Thought Content: Thought content normal.        Judgment: Judgment normal.     Wt Readings from Last 3 Encounters:  08/13/22 170 lb 12.8 oz (77.5 kg)  08/06/22 169 lb (76.7 kg)   06/24/22 171 lb (77.6 kg)     ASSESSMENT & PLAN:   Recurrent CVAs: No new deficits.  Speech continues to improve.  Prior extensive cardiac work-up including TEE x2, transcranial Doppler, ILR with subsequent Zio patch monitoring x2 have been unrevealing for cardiac etiology of recurrent CVA.  She remains on ezetimibe and low-dose rosuvastatin.  Chest pain/palpitations: No further symptoms of chest pain.  Palpitations quiescent.  She remains on clopidogrel given recurrent CVAs.  Aggressive risk factor modification and primary prevention.  HLD associated DM2: Last LDL 75 with normal LFTs on rosuvastatin 10 mg and ezetimibe.  Fatigue: Prior sleep study negative.  Improving.    Disposition: F/u with Dr. Saunders Revel or an APP in 6 months.   Medication Adjustments/Labs and Tests Ordered: Current medicines are reviewed at length with the patient today.  Concerns regarding medicines are outlined above. Medication changes, Labs and Tests ordered today are summarized above and listed in the Patient Instructions accessible in Encounters.   Signed, Christell Faith, PA-C 08/13/2022 4:50 PM     [address] [place], [place] [address] [phone]

## 2022-08-13 ENCOUNTER — Ambulatory Visit: Payer: BC Managed Care – PPO | Attending: Physician Assistant | Admitting: Physician Assistant

## 2022-08-13 ENCOUNTER — Encounter: Payer: Self-pay | Admitting: Physician Assistant

## 2022-08-13 VITALS — BP 116/82 | HR 74 | Ht 62.0 in | Wt 170.8 lb

## 2022-08-13 DIAGNOSIS — E1169 Type 2 diabetes mellitus with other specified complication: Secondary | ICD-10-CM | POA: Diagnosis not present

## 2022-08-13 DIAGNOSIS — R002 Palpitations: Secondary | ICD-10-CM | POA: Diagnosis not present

## 2022-08-13 DIAGNOSIS — R7989 Other specified abnormal findings of blood chemistry: Secondary | ICD-10-CM

## 2022-08-13 DIAGNOSIS — R079 Chest pain, unspecified: Secondary | ICD-10-CM | POA: Diagnosis not present

## 2022-08-13 DIAGNOSIS — E785 Hyperlipidemia, unspecified: Secondary | ICD-10-CM | POA: Diagnosis not present

## 2022-08-13 DIAGNOSIS — I639 Cerebral infarction, unspecified: Secondary | ICD-10-CM

## 2022-08-13 NOTE — Patient Instructions (Signed)
Medication Instructions:  No changes at this time.   *If you need a refill on your cardiac medications before your next appointment, please call your pharmacy*   Lab Work: None  If you have labs (blood work) drawn today and your tests are completely normal, you will receive your results only by: [place] (if you have MyChart) OR A paper copy in the mail If you have any lab test that is abnormal or we need to change your treatment, we will call you to review the results.   Testing/Procedures: None   Follow-Up: At [facility], you and your health needs are our priority.  As part of our continuing mission to provide you with exceptional heart care, we have created designated Provider Care Teams.  These Care Teams include your primary Cardiologist (physician) and Advanced Practice Providers (APPs -  Physician Assistants and Nurse Practitioners) who all work together to provide you with the care you need, when you need it.   Your next appointment:   6 month(s)  Provider:   Nelva Bush, MD or Christell Faith, PA-C    Other Instructions May use [facility]

## 2022-08-14 ENCOUNTER — Other Ambulatory Visit: Payer: Self-pay | Admitting: Primary Care

## 2022-08-14 DIAGNOSIS — K219 Gastro-esophageal reflux disease without esophagitis: Secondary | ICD-10-CM

## 2022-08-14 DIAGNOSIS — F330 Major depressive disorder, recurrent, mild: Secondary | ICD-10-CM | POA: Diagnosis not present

## 2022-09-05 ENCOUNTER — Other Ambulatory Visit: Payer: Self-pay | Admitting: Primary Care

## 2022-09-05 DIAGNOSIS — I10 Essential (primary) hypertension: Secondary | ICD-10-CM

## 2022-09-23 DIAGNOSIS — F330 Major depressive disorder, recurrent, mild: Secondary | ICD-10-CM | POA: Diagnosis not present

## 2022-10-11 DIAGNOSIS — F3342 Major depressive disorder, recurrent, in full remission: Secondary | ICD-10-CM | POA: Diagnosis not present

## 2022-10-15 ENCOUNTER — Other Ambulatory Visit: Payer: Self-pay | Admitting: Physician Assistant

## 2022-10-22 ENCOUNTER — Ambulatory Visit: Payer: BC Managed Care – PPO | Admitting: Physician Assistant

## 2022-10-27 DIAGNOSIS — F330 Major depressive disorder, recurrent, mild: Secondary | ICD-10-CM | POA: Diagnosis not present

## 2022-10-31 ENCOUNTER — Other Ambulatory Visit: Payer: Self-pay | Admitting: Primary Care

## 2022-10-31 DIAGNOSIS — E1165 Type 2 diabetes mellitus with hyperglycemia: Secondary | ICD-10-CM

## 2022-10-31 NOTE — Telephone Encounter (Signed)
Patient is due for CPE/follow up, this will be required prior to any further refills.  Please schedule, thank you!   

## 2022-11-01 NOTE — Telephone Encounter (Signed)
Patient has been scheduled

## 2022-11-08 ENCOUNTER — Telehealth: Payer: Self-pay | Admitting: Primary Care

## 2022-11-08 NOTE — Telephone Encounter (Signed)
FYI: This call has been transferred to Access Nurse. Once the result note has been entered staff can address the message at that time.  Patient called in with the following symptoms:  Red Word: diarrhea  from ozempic shot   Please advise at Mobile [phone] (mobile)  Message is routed to Provider Pool and [facility] Triage

## 2022-11-08 NOTE — Telephone Encounter (Signed)
Per appt notes pt already has appt scheduled 11/10/22 at 8:20 with Dr Ermalene Searing. Sending note to Dr Ermalene Searing who is out of office and [place] pool.

## 2022-11-09 NOTE — Telephone Encounter (Signed)
Noted, will evaluate. 

## 2022-11-09 NOTE — Telephone Encounter (Signed)
Spoke to patient by telephone and was advised that she is doing better today. Patient stated that the diarrhea stopped last night. Patient stated that she is eating soup, crackers and drinking lots of fluids to stay hydrated. Patient was advised per Mayra Reel NP not take Ozempic anymore and to follow-up with her. Patient was scheduled to see Dr. Ermalene Searing Tuesday 11/10/22 but patient needs to follow-up with Jae Dire if possible. Patient scheduled for an office visit with [facility] Thursday 11/11/22 at 7:20. Patient stated that she stayed home from work today and plans to go back tomorrow. Patient was given ER precautions and she verbalized understanding.

## 2022-11-10 ENCOUNTER — Ambulatory Visit: Payer: BC Managed Care – PPO | Admitting: Family Medicine

## 2022-11-11 ENCOUNTER — Encounter: Payer: Self-pay | Admitting: Primary Care

## 2022-11-11 ENCOUNTER — Ambulatory Visit (INDEPENDENT_AMBULATORY_CARE_PROVIDER_SITE_OTHER): Payer: BC Managed Care – PPO | Admitting: Primary Care

## 2022-11-11 VITALS — BP 116/64 | HR 95 | Temp 98.6°F | Ht 62.0 in | Wt 157.0 lb

## 2022-11-11 DIAGNOSIS — E1169 Type 2 diabetes mellitus with other specified complication: Secondary | ICD-10-CM

## 2022-11-11 DIAGNOSIS — E1165 Type 2 diabetes mellitus with hyperglycemia: Secondary | ICD-10-CM | POA: Diagnosis not present

## 2022-11-11 DIAGNOSIS — K219 Gastro-esophageal reflux disease without esophagitis: Secondary | ICD-10-CM

## 2022-11-11 DIAGNOSIS — Z0000 Encounter for general adult medical examination without abnormal findings: Secondary | ICD-10-CM

## 2022-11-11 DIAGNOSIS — E785 Hyperlipidemia, unspecified: Secondary | ICD-10-CM

## 2022-11-11 DIAGNOSIS — Z8673 Personal history of transient ischemic attack (TIA), and cerebral infarction without residual deficits: Secondary | ICD-10-CM | POA: Diagnosis not present

## 2022-11-11 DIAGNOSIS — R519 Headache, unspecified: Secondary | ICD-10-CM

## 2022-11-11 DIAGNOSIS — F331 Major depressive disorder, recurrent, moderate: Secondary | ICD-10-CM

## 2022-11-11 DIAGNOSIS — R197 Diarrhea, unspecified: Secondary | ICD-10-CM

## 2022-11-11 DIAGNOSIS — R41840 Attention and concentration deficit: Secondary | ICD-10-CM

## 2022-11-11 DIAGNOSIS — I10 Essential (primary) hypertension: Secondary | ICD-10-CM | POA: Diagnosis not present

## 2022-11-11 DIAGNOSIS — F411 Generalized anxiety disorder: Secondary | ICD-10-CM

## 2022-11-11 DIAGNOSIS — Z23 Encounter for immunization: Secondary | ICD-10-CM

## 2022-11-11 LAB — BASIC METABOLIC PANEL
BUN: 12 mg/dL (ref 6–23)
CO2: 30 mEq/L (ref 19–32)
Calcium: 9.6 mg/dL (ref 8.4–10.5)
Chloride: 105 mEq/L (ref 96–112)
Creatinine, Ser: 0.94 mg/dL (ref 0.40–1.20)
GFR: 70.94 mL/min (ref >60.00)
Glucose, Bld: 100 mg/dL — ABNORMAL HIGH (ref 70–99)
Potassium: 4.2 mEq/L (ref 3.5–5.1)
Sodium: 143 mEq/L (ref 135–145)

## 2022-11-11 LAB — POCT GLYCOSYLATED HEMOGLOBIN (HGB A1C): Hemoglobin A1C: 5.7 % — AB (ref 4.0–5.6)

## 2022-11-11 LAB — LIPID PANEL
Cholesterol: 139 mg/dL (ref 0–200)
HDL: 50.1 mg/dL (ref >39.00)
LDL Cholesterol: 76 mg/dL (ref 0–99)
NonHDL: 88.54
Total CHOL/HDL Ratio: 3
Triglycerides: 62 mg/dL (ref 0.0–149.0)
VLDL: 12.4 mg/dL (ref 0.0–40.0)

## 2022-11-11 LAB — TSH: TSH: 2.13 u[IU]/mL (ref 0.35–5.50)

## 2022-11-11 MED ORDER — CLOPIDOGREL BISULFATE 75 MG PO TABS: 75.0000 mg | ORAL_TABLET | Freq: Every day | ORAL | 3 refills | Status: DC

## 2022-11-11 NOTE — Assessment & Plan Note (Addendum)
Controlled.  Discontinue HCTZ 12.5 mg as she's hardly taking. She will closely monitor BP and send readings via MyChart in 2 weeks.  BMP pending.

## 2022-11-11 NOTE — Assessment & Plan Note (Signed)
Waxes and wanes.   Working with psychiatry and therapy.

## 2022-11-11 NOTE — Assessment & Plan Note (Signed)
Controlled.  Continue pantoprazole 20 mg BID.  

## 2022-11-11 NOTE — Progress Notes (Signed)
Subjective:    Patient ID: Amber [name], female    DOB: 1970-06-14, 52 y.o.   MRN: [id number]  Diarrhea  Pertinent negatives include no arthralgias, coughing, headaches or myalgias.    Amber [name] is a very pleasant 52 y.o. female with a history of type 2 diabetes, recurrent CVA, IBS, GERD, hyperlipidemia who presents today for complete physical and to discuss acute diarrhea.  Currently managed on Ozempic 0.5 mg weekly and metformin XR 500 mg daily for diabetes.  She contacted our office 3 days ago with reports of persistent diarrhea for the prior 2 days.  Today she discusses improved diarrhea yesterday. Since starting Ozempic she's noticed intermittent diarrhea/loose stools. She's also noticed increased GERD symptoms. 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 She's working with her psychiatrist.   She is only taking her HCTZ 12.5 mg twice weekly for the last one month. She doesn't know why. She's also not taken metoprolol.   Immunizations: -Tetanus: Completed in 2020 -Influenza: Completed this season -Shingles: Never completed -Pneumonia: Completed 2016  Diet: Fair diet.  Exercise: No regular exercise.  Eye exam: Completes annually  Dental exam: Completes semi-annually    Pap Smear: Hysterectomy  Mammogram: December 2022, scheduled for June 18th  Colonoscopy: Completed in 2022, due 2027  BP Readings from Last 3 Encounters:  11/11/22 116/64  08/13/22 116/82  08/06/22 110/88   Wt Readings from Last 3 Encounters:  11/11/22 157 lb (71.2 kg)  08/13/22 170 lb 12.8 oz (77.5 kg)  08/06/22 169 lb (76.7 kg)      Review of Systems  Constitutional:  Negative for unexpected weight change.  HENT:  Negative for rhinorrhea.   Respiratory:  Negative for cough and shortness of breath.   Cardiovascular:  Negative for chest pain.  Gastrointestinal:  Positive for diarrhea. Negative for constipation.  Genitourinary:  Negative for difficulty  urinating and menstrual problem.  Musculoskeletal:  Negative for arthralgias and myalgias.  Skin:  Negative for rash.  Allergic/Immunologic: Negative for environmental allergies.  Neurological:  Negative for dizziness and headaches.  Psychiatric/Behavioral:  The patient is nervous/anxious.          Past Medical History:  Diagnosis Date   Anxiety    self reported   Asthma    Cellulitis, toe 11/18/2021   Left great toe  No abscess    Depression    controlled   Diabetes mellitus without complication (HCC)    Hyperlipidemia    controlled with medication   IBS (irritable bowel syndrome)    Left shoulder pain 08/02/2018   Muscle cramps    Precordial pain 09/29/2014   Recurrent strokes (HCC)    Slurred speech    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   Stroke (HCC) 03/13/2015   Tension headache 11/18/2020   Viral upper respiratory tract infection 05/07/2022   Word finding difficulty 02/27/2018    Social History   Socioeconomic History   Marital status: Single    Spouse name: Not on file   Number of children: Not on file   Years of education: Not on file   Highest education level: Not on file  Occupational History   Not on file  Tobacco Use   Smoking status: Never   Smokeless tobacco: Never  Vaping Use   Vaping Use: Never used  Substance and Sexual Activity   Alcohol use: Not Currently    Alcohol/week: 7.0 standard drinks of alcohol    Types: 7 Glasses of wine per week  Comment: red wine occasionally but not since stroke   Drug use: Not Currently    Types: Marijuana    Comment: edibles-twice weekly   Sexual activity: Not Currently  Other Topics Concern   Not on file  Social History Narrative   Originally from [place]   Family lives up here in [place]   Has one daughter.   Enjoys spending time shopping and spending time with family    Social Determinants of Health   Financial Resource Strain: Not on file  Food Insecurity: Not on file   Transportation Needs: Not on file  Physical Activity: Not on file  Stress: Not on file  Social Connections: Not on file  Intimate Partner Violence: Not on file    Past Surgical History:  Procedure Laterality Date   ABDOMINAL HYSTERECTOMY  10/2006   bowel reconstruction  10/2006   with hysterectomy   CESAREAN SECTION  2005   EP IMPLANTABLE DEVICE N/A 03/17/2015   Procedure: Loop Recorder Insertion;  Surgeon: Will Jorja Loa, MD;  Location: [facility]IVE [facility];  Service: Cardiovascular;  Laterality: N/A;   TEE WITHOUT CARDIOVERSION N/A 03/17/2015   Procedure: TRANSESOPHAGEAL ECHOCARDIOGRAM (TEE);  Surgeon: Chilton Si, MD;  Location: [facility] ENDOSCOPY;  Service: Cardiovascular;  Laterality: N/A;   TEE WITHOUT CARDIOVERSION N/A 01/06/2021   Procedure: TRANSESOPHAGEAL ECHOCARDIOGRAM (TEE);  Surgeon: [name], MD;  Location: [facility] ORS;  Service: Cardiovascular;  Laterality: N/A;   TOE SURGERY     Left 2nd metatarsal    Family History  Problem Relation Age of Onset   Hyperlipidemia Mother    Heart failure Father    Arthritis Father    Diabetes Father    Hypertension Father    Hyperlipidemia Father    Prostate cancer Father    Heart attack Brother 34   Prostate cancer Maternal Uncle        x 3   Heart attack Maternal Grandfather    Hyperlipidemia Maternal Grandfather    Prostate cancer Maternal Grandfather    Diabetes Paternal Grandmother    Stroke Paternal Grandmother    Diabetes Paternal Grandfather    Heart attack Paternal Grandfather    Colon cancer Neg Hx    Colon polyps Neg Hx    Stomach cancer Neg Hx    Esophageal cancer Neg Hx    Pancreatic cancer Neg Hx     Allergies  Allergen Reactions   Cephalexin Hives and Rash    Can take Augmentin    Current Outpatient Medications on File Prior to Visit  Medication Sig Dispense Refill   atomoxetine (STRATTERA) 100 MG capsule Take 100 mg by mouth daily.     albuterol (VENTOLIN HFA) 108 (90 Base) MCG/ACT inhaler  Inhale 2 puffs into the lungs every 4 (four) hours as needed for wheezing or shortness of breath (cough, shortness of breath or wheezing.). 1 each 1   Ascorbic Acid (VITAMIN C) 1000 MG tablet Take 1 tablet (1,000 mg total) by mouth daily. 30 tablet 0   azelastine (ASTELIN) 0.1 % nasal spray Place 1 spray into both nostrils 2 (two) times daily. Use in each nostril as directed (Patient not taking: Reported on 08/13/2022) 30 mL 0   cholecalciferol (VITAMIN D3) 25 MCG (1000 UNIT) tablet Take 1 tablet (1,000 Units total) by mouth daily. 30 tablet 0   cyclobenzaprine (FLEXERIL) 10 MG tablet Take 1 tablet (10 mg total) by mouth 3 (three) times daily as needed for muscle spasms. 30 tablet 0   ezetimibe (  ZETIA) 10 MG tablet TAKE 1 TABLET BY MOUTH ONCE DAILY FOR CHOLESTEROL 90 tablet 1   Multiple Vitamins-Minerals (MULTIVITAMIN WITH MINERALS) tablet Take 1 tablet by mouth daily.     pantoprazole (PROTONIX) 20 MG tablet Take 1 tablet (20 mg total) by mouth 2 (two) times daily before a meal. for heartburn. 180 tablet 0   rosuvastatin (CRESTOR) 10 MG tablet Take 1 tablet by mouth once daily 90 tablet 0   Semaglutide,0.25 or 0.5MG /DOS, (OZEMPIC, 0.25 OR 0.5 MG/DOSE,) 2 MG/3ML SOPN Inject 0.5 mg into the skin once a week. for diabetes. 9 mL 0   topiramate (TOPAMAX) 25 MG tablet TAKE 1 TABLET BY MOUTH ONCE DAILY FOR  HEADACHE  PREVENTION 90 tablet 3   No current facility-administered medications on file prior to visit.    BP 116/64   Pulse 95   Temp 98.6 F (37 C) (Temporal)   Ht 5\' 2"  (1.575 m)   Wt 157 lb (71.2 kg)   SpO2 98%   BMI 28.72 kg/m  Objective:   Physical Exam HENT:     Right Ear: Tympanic membrane and ear canal normal.     Left Ear: Tympanic membrane and ear canal normal.     Nose: Nose normal.  Eyes:     Conjunctiva/sclera: Conjunctivae normal.     Pupils: Pupils are equal, round, and reactive to light.  Neck:     Thyroid: No thyromegaly.  Cardiovascular:     Rate and Rhythm: Normal  rate and regular rhythm.     Heart sounds: No murmur heard. Pulmonary:     Effort: Pulmonary effort is normal.     Breath sounds: Normal breath sounds. No rales.  Abdominal:     General: Bowel sounds are normal.     Palpations: Abdomen is soft.     Tenderness: There is no abdominal tenderness.  Musculoskeletal:        General: Normal range of motion.     Cervical back: Neck supple.  Lymphadenopathy:     Cervical: No cervical adenopathy.  Skin:    General: Skin is warm and dry.     Findings: No rash.  Neurological:     Mental Status: She is alert and oriented to person, place, and time.     Cranial Nerves: No cranial nerve deficit.     Deep Tendon Reflexes: Reflexes are normal and symmetric.  Psychiatric:        Mood and Affect: Mood normal.           Assessment & Plan:  Healthcare maintenance Assessment & Plan: First Shingrix vaccine provided today. Mammogram due, she has this scheduled. Colonoscopy UTD, due 2027  Discussed the importance of a healthy diet and regular exercise in order for weight loss, and to reduce the risk of further co-morbidity.  Exam stable. Labs pending.  Follow up in 1 year for repeat physical.    Controlled type 2 diabetes mellitus with hyperglycemia, without long-term current use of insulin (HCC) Assessment & Plan: Controlled with A1C today of 5.7!  Discontinue metformin XR 500 mg due to GI effects. Continue Ozempic 0.5 mg weekly for now. She will update if she continues to experience diarrhea. Would switch to [facility] if needed.  Follow up in 6 months.  Orders: -     POCT glycosylated hemoglobin (Hb A1C)  History of CVA (cerebrovascular accident) Assessment & Plan: No new symptoms.  Continue aggressive control of diabetes, hyperlipidemia, hypertension. No longer following with physical medicine.  Refills provided  for clopidogrel 75 mg daily.  Orders: -     Clopidogrel Bisulfate; Take 1 tablet (75 mg total) by mouth daily.   Dispense: 90 tablet; Refill: 3  Essential hypertension Assessment & Plan: Controlled.  Discontinue HCTZ 12.5 mg as she's hardly taking. She will closely monitor BP and send readings via MyChart in 2 weeks.  BMP pending.  Orders: -     Basic metabolic panel -     TSH  Acute diarrhea Assessment & Plan: Most recent episode doesn't appear infectious. After further discussion it seems that loose stools have been more chronic.  Discussed options.  Will discontinue metformin as she's seen great results from Ozempic with weight loss.   She will update.    Gastroesophageal reflux disease, unspecified whether esophagitis present Assessment & Plan: Controlled.  Continue pantoprazole 20 mg BID.    Hyperlipidemia associated with type 2 diabetes mellitus (HCC) Assessment & Plan: Repeat lipid panel pending.  Continue rosuvastatin 10 mg daily, Zetia 10 mg daily.  Orders: -     Lipid panel  Concentration deficit Assessment & Plan: Following with psychiatry.  Continue Strattera 100 mg daily.   GAD (generalized anxiety disorder) Assessment & Plan: Waxes and wanes.  Following with psychiatry and therapy. Continue Strattera 100 mg daily.    Moderate episode of recurrent major depressive disorder [facility]) Assessment & Plan: Waxes and wanes.   Working with psychiatry and therapy.   Frequent headaches Assessment & Plan: Controlled.  Continue Topamax 25 mg daily.         Doreene Nest, NP

## 2022-11-11 NOTE — Assessment & Plan Note (Signed)
Waxes and wanes.  Following with psychiatry and therapy. Continue Strattera 100 mg daily.

## 2022-11-11 NOTE — Assessment & Plan Note (Signed)
Controlled.  Continue Topamax 25 mg daily.

## 2022-11-11 NOTE — Assessment & Plan Note (Signed)
Repeat lipid panel pending.  Continue rosuvastatin 10 mg daily, Zetia 10 mg daily.

## 2022-11-11 NOTE — Assessment & Plan Note (Signed)
Following with psychiatry.  Continue Strattera 100 mg daily.

## 2022-11-11 NOTE — Patient Instructions (Addendum)
Stop by the lab prior to leaving today. I will notify you of your results once received.   Stop taking metformin for diabetes.  Continue taking Ozempic weekly. Please update me if you continue to notice diarrhea.  Resume clopidogrel (Plavix) 75 mg daily.   Complete your mammogram.   Completely stop taking hydrochlorothiazide for blood pressure. Send me blood pressure readings in 2 weeks via MyChart.   Please schedule a follow up visit for 6 months for a diabetes check and 2nd shingles shot.  It was a pleasure to see you today!

## 2022-11-11 NOTE — Assessment & Plan Note (Addendum)
No new symptoms.  Continue aggressive control of diabetes, hyperlipidemia, hypertension. No longer following with physical medicine.  Refills provided for clopidogrel 75 mg daily.

## 2022-11-11 NOTE — Assessment & Plan Note (Signed)
Controlled with A1C today of 5.7!  Discontinue metformin XR 500 mg due to GI effects. Continue Ozempic 0.5 mg weekly for now. She will update if she continues to experience diarrhea. Would switch to [facility] if needed.  Follow up in 6 months.

## 2022-11-11 NOTE — Assessment & Plan Note (Signed)
First Shingrix vaccine provided today. Mammogram due, she has this scheduled. Colonoscopy UTD, due 2027  Discussed the importance of a healthy diet and regular exercise in order for weight loss, and to reduce the risk of further co-morbidity.  Exam stable. Labs pending.  Follow up in 1 year for repeat physical.

## 2022-11-11 NOTE — Assessment & Plan Note (Signed)
Most recent episode doesn't appear infectious. After further discussion it seems that loose stools have been more chronic.  Discussed options.  Will discontinue metformin as she's seen great results from Ozempic with weight loss.   She will update.

## 2022-11-22 ENCOUNTER — Encounter: Payer: BC Managed Care – PPO | Admitting: Primary Care

## 2022-12-08 ENCOUNTER — Other Ambulatory Visit: Payer: Self-pay | Admitting: Primary Care

## 2022-12-08 DIAGNOSIS — I10 Essential (primary) hypertension: Secondary | ICD-10-CM

## 2022-12-14 DIAGNOSIS — Z1231 Encounter for screening mammogram for malignant neoplasm of breast: Secondary | ICD-10-CM | POA: Diagnosis not present

## 2022-12-14 LAB — HM MAMMOGRAPHY

## 2022-12-20 ENCOUNTER — Ambulatory Visit: Payer: BC Managed Care – PPO | Admitting: Physician Assistant

## 2022-12-24 ENCOUNTER — Other Ambulatory Visit: Payer: Self-pay | Admitting: Primary Care

## 2022-12-24 DIAGNOSIS — E1165 Type 2 diabetes mellitus with hyperglycemia: Secondary | ICD-10-CM

## 2022-12-31 ENCOUNTER — Telehealth: Payer: Self-pay

## 2022-12-31 ENCOUNTER — Ambulatory Visit: Payer: BC Managed Care – PPO | Admitting: Physician Assistant

## 2022-12-31 ENCOUNTER — Encounter: Payer: Self-pay | Admitting: Physician Assistant

## 2022-12-31 VITALS — BP 112/76 | HR 78 | Ht 62.0 in | Wt 162.0 lb

## 2022-12-31 DIAGNOSIS — R1013 Epigastric pain: Secondary | ICD-10-CM | POA: Diagnosis not present

## 2022-12-31 DIAGNOSIS — R112 Nausea with vomiting, unspecified: Secondary | ICD-10-CM

## 2022-12-31 DIAGNOSIS — R142 Eructation: Secondary | ICD-10-CM | POA: Diagnosis not present

## 2022-12-31 NOTE — Patient Instructions (Signed)
You have been scheduled for an endoscopy. Please follow written instructions given to you at your visit today.  If you use inhalers (even only as needed), please bring them with you on the day of your procedure.  If you take any of the following medications, they will need to be adjusted prior to your procedure:   DO NOT TAKE 7 DAYS PRIOR TO TEST- Trulicity (dulaglutide) Ozempic, Wegovy (semaglutide) Mounjaro (tirzepatide) Bydureon Bcise (exanatide extended release)  DO NOT TAKE 1 DAY PRIOR TO YOUR TEST Rybelsus (semaglutide) Adlyxin (lixisenatide) Victoza (liraglutide) Byetta (exanatide) ___________________________________________________________________________   Bonita Quin will be contaced by our office prior to your procedure for directions on holding your Plavix.  If you do not hear from our office 1 week prior to your scheduled procedure, please call [phone] to discuss. Ask for PJ, CMA  I appreciate the opportunity to care for you. Hyacinth Meeker, PA-C

## 2022-12-31 NOTE — Progress Notes (Addendum)
Chief Complaint: IBS follow-up  HPI:     Amber [name] is a 52 year old female with a past medical history as listed below including depression, anxiety, diabetes, stroke,  IBS and multiple others, known to Dr. Rhea Belton, who presents to clinic today with a complaint of IBS.    07/30/2020 office visit with Willette Cluster, NP, at that time discussed chronic constipation.  She had stopped Linzess.  At that time discussed IBS-C.  Fiber tips were working well for her.  Also discussed possibly Linzess or a different agent if she continues with constipation.  Scheduled for colonoscopy.      08/29/2020 colonoscopy with fair preparation of the colon after copious irrigation and lavage and small internal hemorrhoids.  Repeat recommended in 5 years with a 2-day bowel prep.    11/15/2022 patient contacted her PCP and wanted to stay on a low-dose of Ozempic.    Today, the patient presents to clinic and tells me that back in January she was started on Ozempic because she is a stroke survivor and was overweight and her doctor told her that this could help with all of her things including diabetes to lower her risk for repeat stroke.  She did well on this medication and it was titrated up but then she started with GI issues including epigastric discomfort, nausea and vomiting.  Her dose was lowered but she continued with issues.  Now she has not been the medicine since June 17 and has had no issues but she is worried that something is organically going on and there other than just the medicine.  She is debating whether or not she will restart the low-dose and deal with the side effects are not.  She did lose around 12 to 15 pounds with Ozempic.      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    Denies fever, chills, blood in her stool or change in bowel habits.  Past Medical History:  Diagnosis Date   Anxiety    self reported   Asthma    Cellulitis, toe 11/18/2021   Left great toe  No  abscess    Depression    controlled   Diabetes mellitus without complication (HCC)    Hyperlipidemia    controlled with medication   IBS (irritable bowel syndrome)    Left shoulder pain 08/02/2018   Muscle cramps    Precordial pain 09/29/2014   Recurrent strokes (HCC)    Slurred speech    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   Stroke (HCC) 03/13/2015   Tension headache 11/18/2020   Viral upper respiratory tract infection 05/07/2022   Word finding difficulty 02/27/2018    Past Surgical History:  Procedure Laterality Date   ABDOMINAL HYSTERECTOMY  10/2006   bowel reconstruction  10/2006   with hysterectomy   CESAREAN SECTION  2005   EP IMPLANTABLE DEVICE N/A 03/17/2015   Procedure: Loop Recorder Insertion;  Surgeon: Will Jorja Loa, MD;  Location: [facility];  Service: Cardiovascular;  Laterality: N/A;   TEE WITHOUT CARDIOVERSION N/A 03/17/2015   Procedure: TRANSESOPHAGEAL ECHOCARDIOGRAM (TEE);  Surgeon: Chilton Si, MD;  Location: [facility] ENDOSCOPY;  Service: Cardiovascular;  Laterality: N/A;   TEE WITHOUT CARDIOVERSION N/A 01/06/2021   Procedure: TRANSESOPHAGEAL ECHOCARDIOGRAM (TEE);  Surgeon: [name], MD;  Location: [facility] ORS;  Service: Cardiovascular;  Laterality: N/A;   TOE SURGERY     Left 2nd metatarsal    Current Outpatient Medications  Medication Sig Dispense  Refill   albuterol (VENTOLIN HFA) 108 (90 Base) MCG/ACT inhaler Inhale 2 puffs into the lungs every 4 (four) hours as needed for wheezing or shortness of breath (cough, shortness of breath or wheezing.). 1 each 1   Ascorbic Acid (VITAMIN C) 1000 MG tablet Take 1 tablet (1,000 mg total) by mouth daily. 30 tablet 0   atomoxetine (STRATTERA) 100 MG capsule Take 100 mg by mouth daily.     azelastine (ASTELIN) 0.1 % nasal spray Place 1 spray into both nostrils 2 (two) times daily. Use in each nostril as directed (Patient not taking: Reported on 08/13/2022) 30 mL 0   cholecalciferol (VITAMIN D3) 25 MCG  (1000 UNIT) tablet Take 1 tablet (1,000 Units total) by mouth daily. 30 tablet 0   clopidogrel (PLAVIX) 75 MG tablet Take 1 tablet (75 mg total) by mouth daily. 90 tablet 3   cyclobenzaprine (FLEXERIL) 10 MG tablet Take 1 tablet (10 mg total) by mouth 3 (three) times daily as needed for muscle spasms. 30 tablet 0   ezetimibe (ZETIA) 10 MG tablet TAKE 1 TABLET BY MOUTH ONCE DAILY FOR CHOLESTEROL 90 tablet 1   Multiple Vitamins-Minerals (MULTIVITAMIN WITH MINERALS) tablet Take 1 tablet by mouth daily.     OZEMPIC, 0.25 OR 0.5 MG/DOSE, 2 MG/3ML SOPN INJECT 0.5MG  SUBCUTANEOUSLY ONCE A WEEK FOR  DIABETES 9 mL 0   pantoprazole (PROTONIX) 20 MG tablet Take 1 tablet (20 mg total) by mouth 2 (two) times daily before a meal. for heartburn. 180 tablet 0   rosuvastatin (CRESTOR) 10 MG tablet Take 1 tablet by mouth once daily 90 tablet 0   topiramate (TOPAMAX) 25 MG tablet TAKE 1 TABLET BY MOUTH ONCE DAILY FOR  HEADACHE  PREVENTION 90 tablet 3   No current facility-administered medications for this visit.    Allergies as of 12/31/2022 - Review Complete 11/11/2022  Allergen Reaction Noted   Cephalexin Hives and Rash 02/07/2012    Family History  Problem Relation Age of Onset   Hyperlipidemia Mother    Heart failure Father    Arthritis Father    Diabetes Father    Hypertension Father    Hyperlipidemia Father    Prostate cancer Father    Heart attack Brother 69   Prostate cancer Maternal Uncle        x 3   Heart attack Maternal Grandfather    Hyperlipidemia Maternal Grandfather    Prostate cancer Maternal Grandfather    Diabetes Paternal Grandmother    Stroke Paternal Grandmother    Diabetes Paternal Grandfather    Heart attack Paternal Grandfather    Colon cancer Neg Hx    Colon polyps Neg Hx    Stomach cancer Neg Hx    Esophageal cancer Neg Hx    Pancreatic cancer Neg Hx     Social History   Socioeconomic History   Marital status: Single    Spouse name: Not on file   Number of  children: Not on file   Years of education: Not on file   Highest education level: Not on file  Occupational History   Not on file  Tobacco Use   Smoking status: Never   Smokeless tobacco: Never  Vaping Use   Vaping status: Never Used  Substance and Sexual Activity   Alcohol use: Not Currently    Alcohol/week: 7.0 standard drinks of alcohol    Types: 7 Glasses of wine per week    Comment: red wine occasionally but not since stroke   Drug  use: Not Currently    Types: Marijuana    Comment: edibles-twice weekly   Sexual activity: Not Currently  Other Topics Concern   Not on file  Social History Narrative   Originally from [place]   Family lives up here in [place]   Has one daughter.   Enjoys spending time shopping and spending time with family    Social Determinants of Health   Financial Resource Strain: Not on file  Food Insecurity: Not on file  Transportation Needs: Not on file  Physical Activity: Not on file  Stress: Not on file  Social Connections: Not on file  Intimate Partner Violence: Not on file    Review of Systems:    Constitutional: No weight loss, fever or chills Cardiovascular: No chest pain  Respiratory: No SOB  Gastrointestinal: See HPI and otherwise negative   Physical Exam:  Vital signs: BP 112/76   Pulse 78   Ht 5\' 2"  (1.575 m)   Wt 162 lb (73.5 kg)   BMI 29.63 kg/m    Constitutional:   Pleasant AA female appears to be in NAD, Well developed, Well nourished, alert and cooperative Respiratory: Respirations even and unlabored. Lungs clear to auscultation bilaterally.   No wheezes, crackles, or rhonchi.  Cardiovascular: Normal S1, S2. No MRG. Regular rate and rhythm. No peripheral edema, cyanosis or pallor.  Gastrointestinal:  Soft, nondistended, nontender. No rebound or guarding. Normal bowel sounds. No appreciable masses or hepatomegaly. Psychiatric: Demonstrates good judgement and reason without abnormal affect or  behaviors.  RELEVANT LABS AND IMAGING: CBC    Component Value Date/Time   WBC 5.0 02/03/2021 1624   RBC 5.21 (H) 02/03/2021 1624   HGB 14.7 02/03/2021 1624   HGB 14.7 11/29/2016 1025   HCT 46.8 (H) 02/03/2021 1624   HCT 44.2 11/29/2016 1025   PLT 249 02/03/2021 1624   PLT 272 11/29/2016 1025   MCV 89.8 02/03/2021 1624   MCV 85 11/29/2016 1025   MCH 28.2 02/03/2021 1624   MCHC 31.4 02/03/2021 1624   RDW 13.6 02/03/2021 1624   RDW 13.7 11/29/2016 1025   LYMPHSABS 1.7 02/03/2021 1624   LYMPHSABS 2.6 05/12/2016 0000   MONOABS 0.3 02/03/2021 1624   EOSABS 0.1 02/03/2021 1624   EOSABS 0.2 05/12/2016 0000   BASOSABS 0.0 02/03/2021 1624   BASOSABS 0.0 05/12/2016 0000    CMP     Component Value Date/Time   NA 143 11/11/2022 0816   NA 143 12/17/2019 1109   K 4.2 11/11/2022 0816   CL 105 11/11/2022 0816   CO2 30 11/11/2022 0816   GLUCOSE 100 (H) 11/11/2022 0816   BUN 12 11/11/2022 0816   BUN 12 12/17/2019 1109   CREATININE 0.94 11/11/2022 0816   CALCIUM 9.6 11/11/2022 0816   PROT 7.8 06/14/2022 1512   PROT 7.9 05/12/2016 0000   ALBUMIN 3.9 06/14/2022 1512   ALBUMIN 4.3 05/12/2016 0000   AST 23 06/14/2022 1512   ALT 34 06/14/2022 1512   ALKPHOS 59 06/14/2022 1512   BILITOT 0.7 06/14/2022 1512   BILITOT 0.3 05/12/2016 0000   GFRNONAA >60 02/03/2021 1624   GFRAA 90 12/17/2019 1109    Assessment: 1.   Nausea/vomiting and epigastric pain: Occurred while she was on Ozempic, she has not been on this medicine for the past 2 months and is no longer having any issues, but wants to make sure that there is nothing organically wrong inside of her; most likely medication side effect +/- ongoing gastritis  Plan: 1.  Scheduled patient for diagnostic EGD in the [facility].  Did provide the patient with a detailed list of risks for the procedure and she agrees to proceed. Patient is appropriate for endoscopic procedure(s) in the ambulatory ([facility]) setting. (Procedure with Dr. Leone Payor per her  availability) 2.  Patient will hold her Plavix for 5 days prior to time of procedure.  We will communicate with her physician to ensure this is acceptable for her. 3.  Discussed that Ozempic works by slowing the stomach and can cause symptoms like this.  Most likely it is just the medication.  Will be up to her whether or not she wants to continue with the symptoms in mind. 4.  Continue Pantoprazole 20 mg daily, could consider increasing this to help in the future. 4.  Patient to follow in clinic per recommendations after time of procedure.  Amber Meeker, PA-C [facility] 12/31/2022, 2:59 PM  Cc: Doreene Nest, NP

## 2022-12-31 NOTE — Addendum Note (Signed)
Addended by: [place], Klinton Candelas E on: 12/31/2022 04:14 PM   Modules accepted: Orders

## 2022-12-31 NOTE — Telephone Encounter (Signed)
[facility] Pre-operative Risk Assessment     Request for surgical clearance:     Endoscopy Procedure  What type of surgery is being performed?     EGD  When is this surgery scheduled?     02/28/23  What type of clearance is required ?   Pharmacy  Are there any medications that need to be held prior to surgery and how long? Plavix, 5 days  Practice name and name of physician performing surgery?      [facility]  What is your office phone and fax number?      Phone- [phone]  Fax- [phone]  Anesthesia type (None, local, MAC, general) ?       MAC

## 2023-01-03 ENCOUNTER — Telehealth: Payer: Self-pay

## 2023-01-03 NOTE — Telephone Encounter (Signed)
   Name: Amber [name]  DOB: 08-24-1970  MRN: [id number]   Primary Cardiologist: [name], MD  Chart reviewed as part of pre-operative protocol coverage.  Plavix prescribed by a noncardiology provider therefore recommendations for holding deferred to prescribing provider (PCP).    I will route this recommendation to the requesting party via Epic fax function and remove from pre-op pool. Please call with questions.  Carlos Levering, NP 01/03/2023, 4:38 PM

## 2023-01-03 NOTE — Telephone Encounter (Signed)
Patient may discontinue clopidogrel 5 days prior to EGD, but she should resume as soon as hemodynamically stable.

## 2023-01-03 NOTE — Telephone Encounter (Signed)
   Amber [name] 08-08-1970 [id number]  Dear Vernona Rieger NP:  We have scheduled the above named patient for a(n) EGD procedure. Our records show that (s)he is on anticoagulation therapy.  Please advise as to whether the patient may come off their therapy of Plavix 5  days prior to their procedure which is scheduled for 02/28/2023.  Please route your response to Oddie Kuhlmann [place], CMA  or fax response to [phone].  Sincerely,    [facility]

## 2023-01-04 NOTE — Telephone Encounter (Signed)
I have left Amber [name] a detailed message about holding her clopidogrel  5 days prior to her procedure. I told her I need her to contact me back so I can confirm she got this message.

## 2023-01-06 NOTE — Telephone Encounter (Signed)
I spoke with Amber [name] and she verbalized understanding to hold her blood thinner 5 days prior. She request a call to remind her as well. I will send myself a reminder to call her on September 23rd. She will take her last dose Sept 24th. She is going to put it in her phone as well.

## 2023-01-06 NOTE — Telephone Encounter (Signed)
PT confirmed that she received the message about holding clopidogrel

## 2023-01-12 DIAGNOSIS — F330 Major depressive disorder, recurrent, mild: Secondary | ICD-10-CM | POA: Diagnosis not present

## 2023-01-14 NOTE — Progress Notes (Signed)
Addendum: Reviewed and agree with assessment and management plan. Pyrtle, Jay M, MD  

## 2023-01-18 ENCOUNTER — Other Ambulatory Visit: Payer: Self-pay | Admitting: Physician Assistant

## 2023-01-26 ENCOUNTER — Other Ambulatory Visit: Payer: Self-pay

## 2023-01-26 ENCOUNTER — Emergency Department: Payer: BC Managed Care – PPO

## 2023-01-26 ENCOUNTER — Encounter: Payer: Self-pay | Admitting: Emergency Medicine

## 2023-01-26 ENCOUNTER — Emergency Department
Admission: EM | Admit: 2023-01-26 | Discharge: 2023-01-26 | Disposition: A | Discharge status: Home / Self Care | Payer: BC Managed Care – PPO | Attending: Emergency Medicine | Admitting: Emergency Medicine

## 2023-01-26 DIAGNOSIS — I639 Cerebral infarction, unspecified: Secondary | ICD-10-CM | POA: Diagnosis not present

## 2023-01-26 DIAGNOSIS — R200 Anesthesia of skin: Secondary | ICD-10-CM | POA: Insufficient documentation

## 2023-01-26 DIAGNOSIS — R9431 Abnormal electrocardiogram [ECG] [EKG]: Secondary | ICD-10-CM | POA: Diagnosis not present

## 2023-01-26 DIAGNOSIS — R202 Paresthesia of skin: Secondary | ICD-10-CM | POA: Diagnosis not present

## 2023-01-26 LAB — CBC WITH DIFFERENTIAL/PLATELET
Abs Immature Granulocytes: 0 10*3/uL (ref 0.00–0.07)
Basophils Absolute: 0 10*3/uL (ref 0.0–0.1)
Basophils Relative: 1 %
Eosinophils Absolute: 0.1 10*3/uL (ref 0.0–0.5)
Eosinophils Relative: 2 %
HCT: 49.2 % — ABNORMAL HIGH (ref 36.0–46.0)
Hemoglobin: 15.5 g/dL — ABNORMAL HIGH (ref 12.0–15.0)
Immature Granulocytes: 0 %
Lymphocytes Relative: 51 %
Lymphs Abs: 2.1 10*3/uL (ref 0.7–4.0)
MCH: 27.9 pg (ref 26.0–34.0)
MCHC: 31.5 g/dL (ref 30.0–36.0)
MCV: 88.5 fL (ref 80.0–100.0)
Monocytes Absolute: 0.3 10*3/uL (ref 0.1–1.0)
Monocytes Relative: 6 %
Neutro Abs: 1.7 10*3/uL (ref 1.7–7.7)
Neutrophils Relative %: 40 %
Platelets: 255 10*3/uL (ref 150–400)
RBC: 5.56 MIL/uL — ABNORMAL HIGH (ref 3.87–5.11)
RDW: 13 % (ref 11.5–15.5)
WBC: 4.2 10*3/uL (ref 4.0–10.5)
nRBC: 0 % (ref 0.0–0.2)

## 2023-01-26 LAB — URINALYSIS, ROUTINE W REFLEX MICROSCOPIC
Bacteria, UA: NONE SEEN
Bilirubin Urine: NEGATIVE
Glucose, UA: NEGATIVE mg/dL
Hgb urine dipstick: NEGATIVE
Ketones, ur: NEGATIVE mg/dL
Nitrite: NEGATIVE
Protein, ur: NEGATIVE mg/dL
Specific Gravity, Urine: 1.008 (ref 1.005–1.030)
Squamous Epithelial / HPF: NONE SEEN /HPF (ref 0–5)
pH: 6 (ref 5.0–8.0)

## 2023-01-26 LAB — COMPREHENSIVE METABOLIC PANEL
ALT: 36 U/L (ref 0–44)
AST: 24 U/L (ref 15–41)
Albumin: 4.5 g/dL (ref 3.5–5.0)
Alkaline Phosphatase: 62 U/L (ref 38–126)
Anion gap: 7 (ref 5–15)
BUN: 14 mg/dL (ref 6–20)
CO2: 27 mmol/L (ref 22–32)
Calcium: 9.7 mg/dL (ref 8.9–10.3)
Chloride: 105 mmol/L (ref 98–111)
Creatinine, Ser: 1 mg/dL (ref 0.44–1.00)
GFR, Estimated: >60 mL/min (ref >60)
Glucose, Bld: 98 mg/dL (ref 70–99)
Potassium: 3.3 mmol/L — ABNORMAL LOW (ref 3.5–5.1)
Sodium: 139 mmol/L (ref 135–145)
Total Bilirubin: 0.7 mg/dL (ref 0.3–1.2)
Total Protein: 8.4 g/dL — ABNORMAL HIGH (ref 6.5–8.1)

## 2023-01-26 LAB — CBG MONITORING, ED: Glucose-Capillary: 77 mg/dL (ref 70–99)

## 2023-01-26 MED ORDER — ACETAMINOPHEN 500 MG PO TABS
1000.0000 mg | ORAL_TABLET | Freq: Once | ORAL | Status: AC
  Administered 2023-01-26: 1000 mg via ORAL
  Filled 2023-01-26: qty 2

## 2023-01-26 MED ORDER — LORAZEPAM 2 MG/ML IJ SOLN
1.0000 mg | Freq: Once | INTRAMUSCULAR | Status: AC
  Administered 2023-01-26: 1 mg via INTRAVENOUS
  Filled 2023-01-26: qty 1

## 2023-01-26 MED ORDER — ASPIRIN 81 MG PO TBEC: 81.0000 mg | DELAYED_RELEASE_TABLET | Freq: Every day | ORAL | Status: DC

## 2023-01-26 NOTE — ED Notes (Signed)
ED Provider at bedside. 

## 2023-01-26 NOTE — ED Triage Notes (Addendum)
Pt presents ambulatory to triage via POV with complaints of tongue tingling that started today around 30 mins ago but has resolved. Hx of a stroke in 2016 and has "frequent strokes" and is unsure what residual deficits she had in the past. NIH -0. A&Ox4 at this time. Denies CP or SOB.   CBG-77

## 2023-01-26 NOTE — ED Notes (Signed)
Pt taken to the room and attached to the monitor; primary RN made aware of the patients arrival.

## 2023-01-26 NOTE — ED Notes (Signed)
Pt returned from CT. Pt giving urine sample at this time.

## 2023-01-26 NOTE — ED Provider Notes (Signed)
[facility] Provider Note    Event Date/Time   First MD Initiated Contact with Patient 01/26/23 1925     (approximate)   History   Tingling   HPI  Amber [name] is a 52 y.o. female here with facial tingling. Pt has h/o CVA x 2 in the past, each time presenting with facial numbness and slurred speech. She states that just prior to arrival she began to feel a mild numbness/heaviness feeling in her left jaw and cheek. Reports she has no associated slurred speech or changes in speech, unlike her prior CVAs. Sx are now resolved. No dental pain. No recent trauma. No focal numbness, weakness, loss of balance, or other sx.       Physical Exam   Triage Vital Signs: ED Triage Vitals  Encounter Vitals Group     BP 01/26/23 1910 (!) 150/103     Systolic BP Percentile --      Diastolic BP Percentile --      Pulse Rate 01/26/23 1910 (!) 106     Resp 01/26/23 1910 18     Temp 01/26/23 1910 98.5 F (36.9 C)     Temp Source 01/26/23 1910 Oral     SpO2 01/26/23 1910 100 %     Weight 01/26/23 1911 156 lb (70.8 kg)     Height 01/26/23 1911 5\' 2"  (1.575 m)     Head Circumference --      Peak Flow --      Pain Score --      Pain Loc --      Pain Education --      Exclude from Growth Chart --     Most recent vital signs: Vitals:   01/26/23 2300 01/26/23 2315  BP: (!) 126/93   Pulse: 89 91  Resp: 20 17  Temp:    SpO2: 100% 100%     General: Awake, no distress.  CV:  Good peripheral perfusion. RRR. Resp:  Normal work of breathing. Lungs clear bilaterally. Abd:  No distention. No tenderness. Other:  AOx4. EOMI and PERRL. Facial strength and sensation normal bilaterally. Tongue protrusion midline. Speech is clear. Normal shoulder shrug. Strength 5/5 bl UE and LE. Normal sensation to light touch. Normal FTN.   ED Results / Procedures / Treatments   Labs (all labs ordered are listed, but only abnormal results are displayed) Labs Reviewed  CBC WITH  DIFFERENTIAL/PLATELET - Abnormal; Notable for the following components:      Result Value   RBC 5.56 (*)    Hemoglobin 15.5 (*)    HCT 49.2 (*)    All other components within normal limits  COMPREHENSIVE METABOLIC PANEL - Abnormal; Notable for the following components:   Potassium 3.3 (*)    Total Protein 8.4 (*)    All other components within normal limits  URINALYSIS, ROUTINE W REFLEX MICROSCOPIC - Abnormal; Notable for the following components:   Color, Urine STRAW (*)    APPearance CLEAR (*)    Leukocytes,Ua SMALL (*)    All other components within normal limits  CBG MONITORING, ED     EKG Normal sinus rhythm, VR 83. PR 145, QRS 74, QTc 416. No acute St elevations or depressions.   RADIOLOGY CT Head: NAICA MR Brain: No acute abnormality   I also independently reviewed and agree with radiologist interpretations.   PROCEDURES:  Critical Care performed: No  .1-3 Lead EKG Interpretation  Performed by: Shaune Pollack, MD Authorized by: Shaune Pollack, MD  Interpretation: normal     ECG rate:  80-90s   ECG rate assessment: normal     Rhythm: sinus rhythm     Ectopy: none     Conduction: normal   Comments:     Indication: CVA     MEDICATIONS ORDERED IN ED: Medications  aspirin EC tablet 81 mg (has no administration in time range)  LORazepam (ATIVAN) injection 1 mg (1 mg Intravenous Given 01/26/23 2030)  acetaminophen (TYLENOL) tablet 1,000 mg (1,000 mg Oral Given 01/26/23 2255)     IMPRESSION / MDM / ASSESSMENT AND PLAN / ED COURSE  I reviewed the triage vital signs and the nursing notes.                              Differential diagnosis includes, but is not limited to, TIA, transient facial numbness/paresthesia, CVA, hemorrhagic stroke, electrolyte abnormality  Patient's presentation is most consistent with acute presentation with potential threat to life or bodily function.  The patient is on the cardiac monitor to evaluate for evidence of  arrhythmia and/or significant heart rate changes  52 yo F here with transient facial numbness/heaviness. Suspect recrudescence of old stroke sx in setting of possible HTN/BP changes, atypical migraine, versus TIA. Pt CT head is negative. Sx resolved in ED. MRI obtained, reviewed, and shows no signs of CVA.  Labs reassuring. CBC without leukocytosis. CMP unremarkable. UA negative.  Pt has had no recurrence of sx here and remains asymptomatic. Discussed case with Dr. Amada Jupiter of Neurology. Given her history, will start a low dose ASA in addition to her Plavix in event of possible mild TIA, though recrudescence (transient) of old sx more likely. Will refer to Neurology. Encouraged hydration, monitoring BP at home, and return precautions.   FINAL CLINICAL IMPRESSION(S) / ED DIAGNOSES   Final diagnoses:  Facial numbness     Rx / DC Orders   ED Discharge Orders     None        Note:  This document was prepared using Dragon voice recognition software and may include unintentional dictation errors.   Shaune Pollack, MD 01/27/23 [phone]

## 2023-01-26 NOTE — Discharge Instructions (Addendum)
Start taking a baby aspirin daily (81 mg)  Follow-up with Neurology in 1-2 weeks - you can call [facility] or call Dr. Daisy Blossom office  Continue your plavix and other medications

## 2023-02-01 ENCOUNTER — Encounter: Payer: Self-pay | Admitting: Primary Care

## 2023-02-01 ENCOUNTER — Ambulatory Visit (INDEPENDENT_AMBULATORY_CARE_PROVIDER_SITE_OTHER): Payer: BC Managed Care – PPO | Admitting: Primary Care

## 2023-02-01 VITALS — BP 104/60 | HR 100 | Temp 97.3°F | Ht 62.0 in | Wt 159.0 lb

## 2023-02-01 DIAGNOSIS — E785 Hyperlipidemia, unspecified: Secondary | ICD-10-CM

## 2023-02-01 DIAGNOSIS — R200 Anesthesia of skin: Secondary | ICD-10-CM | POA: Insufficient documentation

## 2023-02-01 DIAGNOSIS — Z7985 Long-term (current) use of injectable non-insulin antidiabetic drugs: Secondary | ICD-10-CM | POA: Diagnosis not present

## 2023-02-01 DIAGNOSIS — I10 Essential (primary) hypertension: Secondary | ICD-10-CM | POA: Diagnosis not present

## 2023-02-01 DIAGNOSIS — E1169 Type 2 diabetes mellitus with other specified complication: Secondary | ICD-10-CM

## 2023-02-01 HISTORY — DX: Anesthesia of skin: R20.0

## 2023-02-01 LAB — BASIC METABOLIC PANEL
BUN: 13 mg/dL (ref 6–23)
CO2: 29 meq/L (ref 19–32)
Calcium: 9.8 mg/dL (ref 8.4–10.5)
Chloride: 105 meq/L (ref 96–112)
Creatinine, Ser: 1.14 mg/dL (ref 0.40–1.20)
GFR: 56.19 mL/min — ABNORMAL LOW (ref >60.00)
Glucose, Bld: 81 mg/dL (ref 70–99)
Potassium: 3.8 meq/L (ref 3.5–5.1)
Sodium: 142 meq/L (ref 135–145)

## 2023-02-01 MED ORDER — ROSUVASTATIN CALCIUM 20 MG PO TABS
20.0000 mg | ORAL_TABLET | Freq: Every day | ORAL | 0 refills | Status: DC
Start: 2023-02-01 — End: 2023-06-17

## 2023-02-01 NOTE — Patient Instructions (Addendum)
We increased the dose of your rosuvastatin (cholesterol pill) to 20 mg daily.  I sent a new prescription to your pharmacy.  Stop by the lab prior to leaving today. I will notify you of your results once received.   Send me blood pressure readings via MyChart in 2 weeks.  Follow-up with neurology as scheduled.  Please schedule a follow up visit for 3 months for diabetes and cholesterol check.  It was a pleasure to see you today!

## 2023-02-01 NOTE — Assessment & Plan Note (Signed)
Stop hydrochlorothiazide 12.5 mg daily.  She will start monitoring BP at home and report if readings are at or above 130/80 consistently.

## 2023-02-01 NOTE — Assessment & Plan Note (Signed)
Exam and HPI suggestive of carpal tunnel syndrome. Neuro exam today stable, no signs of acute CVA.  She will start wearing a brace to her wrists which was effective previously.   Offered to repeat/update EMG testing as it's not quite clear that she's undergone this in the past, she declines.   Vitamin B12 level pending today.  She will update.

## 2023-02-01 NOTE — Progress Notes (Signed)
Subjective:    Patient ID: Amber [name], female    DOB: November 19, 1970, 52 y.o.   MRN: [id number]  HPI  Amber [name] is a very pleasant 52 y.o. female with a history of recurrent CVA, hyperlipidemia, type 2 diabetes, hypertension, GAD, expressive aphasia who presents today for ED follow-up.  She presented to [facility] ED on 01/26/2023 for left sided paresthesias and heaviness to the jaw and cheek.  Symptoms resolved prior to arrival.   During her stay in the ED she underwent CT head which was negative.  This was followed up with MRI brain which was also negative.  Labs were without acute infection, CMP showed potassium of 3.3, urinalysis negative.  Aspirin 81 mg was added to her regimen.  She was discharged home later that day with recommendations for neurology follow-up. She was not treated with oral potassium.   Since her ED visit her symptoms of left sided facial tingling have resolved. She's not experienced this since her ED visit. Her anxiety levels have increased. She is following with therapy and psychiatry.   She has noticed intermittent, bilateral hand numbness which began about 2 weeks ago. She's noticed the numbness when laying down at night. 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 She's had these symptoms in the past, used a wrist brace which helped. She believes she was diagnosed with carpal tunnel syndrome.   She resumed her hydrochlorothiazide last week as her BP was elevated during her ED visit. She is not monitoring her BP at home or school. She has noticed lightheadedness intermittently. She has a neurology visit scheduled for next week.   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 She is requesting to reduce to part time hours for the next few weeks.   BP Readings from Last 3 Encounters:  02/01/23 104/60  01/26/23 (!) 126/93  12/31/22 112/76       Review of Systems  Respiratory:  Negative for shortness of breath.   Cardiovascular:  Negative for chest pain.  Neurological:  Positive for light-headedness and numbness.  Psychiatric/Behavioral:  The patient is nervous/anxious.          Past Medical History:  Diagnosis Date   Anxiety    self reported   Asthma    Cellulitis, toe 11/18/2021   Left great toe  No abscess    Depression    controlled   Diabetes mellitus without complication (HCC)    Hyperlipidemia    controlled with medication   IBS (irritable bowel syndrome)    Left shoulder pain 08/02/2018   Muscle cramps    Precordial pain 09/29/2014   Recurrent strokes (HCC)    Slurred speech    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   Stroke (HCC) 03/13/2015   Tension headache 11/18/2020   Viral upper respiratory tract infection 05/07/2022   Word finding difficulty 02/27/2018    Social History   Socioeconomic History   Marital status: Single    Spouse name: Not on file   Number of children: Not on file   Years of education: Not on file   Highest education level: Not on file  Occupational History   Not on file  Tobacco Use   Smoking status: Never   Smokeless tobacco: Never  Vaping Use   Vaping status: Never Used  Substance and Sexual Activity   Alcohol use: Not Currently  Alcohol/week: 7.0 standard drinks of alcohol    Types: 7 Glasses of wine per week    Comment: red wine occasionally but not since stroke   Drug use: Not Currently    Types: Marijuana    Comment: edibles-twice weekly   Sexual activity: Not Currently  Other Topics Concern   Not on file  Social History Narrative   Originally from [place]   Family lives up here in [place]   Has one daughter.   Enjoys spending time shopping and spending time with family    Social Determinants of Health   Financial Resource Strain: Not on file  Food Insecurity: Not on file  Transportation Needs: Not on file  Physical Activity:  Not on file  Stress: Not on file  Social Connections: Not on file  Intimate Partner Violence: Not on file    Past Surgical History:  Procedure Laterality Date   ABDOMINAL HYSTERECTOMY  10/2006   bowel reconstruction  10/2006   with hysterectomy   CESAREAN SECTION  2005   EP IMPLANTABLE DEVICE N/A 03/17/2015   Procedure: Loop Recorder Insertion;  Surgeon: Will Jorja Loa, MD;  Location: [facility] INVASIVE CV LAB;  Service: Cardiovascular;  Laterality: N/A;   TEE WITHOUT CARDIOVERSION N/A 03/17/2015   Procedure: TRANSESOPHAGEAL ECHOCARDIOGRAM (TEE);  Surgeon: Chilton Si, MD;  Location: [facility] ENDOSCOPY;  Service: Cardiovascular;  Laterality: N/A;   TEE WITHOUT CARDIOVERSION N/A 01/06/2021   Procedure: TRANSESOPHAGEAL ECHOCARDIOGRAM (TEE);  Surgeon: [name], MD;  Location: [facility] ORS;  Service: Cardiovascular;  Laterality: N/A;   TOE SURGERY     Left 2nd metatarsal    Family History  Problem Relation Age of Onset   Hyperlipidemia Mother    Heart failure Father    Arthritis Father    Diabetes Father    Hypertension Father    Hyperlipidemia Father    Prostate cancer Father    Heart attack Brother 61   Prostate cancer Maternal Uncle        x 3   Heart attack Maternal Grandfather    Hyperlipidemia Maternal Grandfather    Prostate cancer Maternal Grandfather    Diabetes Paternal Grandmother    Stroke Paternal Grandmother    Diabetes Paternal Grandfather    Heart attack Paternal Grandfather    Colon cancer Neg Hx    Colon polyps Neg Hx    Stomach cancer Neg Hx    Esophageal cancer Neg Hx    Pancreatic cancer Neg Hx     Allergies  Allergen Reactions   Cephalexin Hives and Rash    Can take Augmentin    Current Outpatient Medications on File Prior to Visit  Medication Sig Dispense Refill   albuterol (VENTOLIN HFA) 108 (90 Base) MCG/ACT inhaler Inhale 2 puffs into the lungs every 4 (four) hours as needed for wheezing or shortness of breath (cough, shortness of breath or  wheezing.). 1 each 1   Ascorbic Acid (VITAMIN C) 1000 MG tablet Take 1 tablet (1,000 mg total) by mouth daily. 30 tablet 0   cholecalciferol (VITAMIN D3) 25 MCG (1000 UNIT) tablet Take 1 tablet (1,000 Units total) by mouth daily. 30 tablet 0   clopidogrel (PLAVIX) 75 MG tablet Take 1 tablet (75 mg total) by mouth daily. 90 tablet 3   cyclobenzaprine (FLEXERIL) 10 MG tablet Take 1 tablet (10 mg total) by mouth 3 (three) times daily as needed for muscle spasms. 30 tablet 0   ezetimibe (ZETIA) 10 MG tablet TAKE 1 TABLET BY MOUTH ONCE  DAILY FOR CHOLESTEROL 90 tablet 1   Multiple Vitamins-Minerals (MULTIVITAMIN WITH MINERALS) tablet Take 1 tablet by mouth daily.     OZEMPIC, 0.25 OR 0.5 MG/DOSE, 2 MG/3ML SOPN INJECT 0.5MG  SUBCUTANEOUSLY ONCE A WEEK FOR  DIABETES 9 mL 0   pantoprazole (PROTONIX) 20 MG tablet Take 1 tablet (20 mg total) by mouth 2 (two) times daily before a meal. for heartburn. 180 tablet 0   topiramate (TOPAMAX) 25 MG tablet TAKE 1 TABLET BY MOUTH ONCE DAILY FOR  HEADACHE  PREVENTION 90 tablet 3   atomoxetine (STRATTERA) 100 MG capsule Take 100 mg by mouth daily. (Patient not taking: Reported on 02/01/2023)     azelastine (ASTELIN) 0.1 % nasal spray Place 1 spray into both nostrils 2 (two) times daily. Use in each nostril as directed (Patient not taking: Reported on 02/01/2023) 30 mL 0   No current facility-administered medications on file prior to visit.    BP 104/60   Pulse 100   Temp (!) 97.3 F (36.3 C) (Temporal)   Ht 5\' 2"  (1.575 m)   Wt 159 lb (72.1 kg)   SpO2 99%   BMI 29.08 kg/m  Objective:   Physical Exam Eyes:     Extraocular Movements: Extraocular movements intact.  Cardiovascular:     Rate and Rhythm: Normal rate and regular rhythm.  Pulmonary:     Effort: Pulmonary effort is normal.     Breath sounds: Normal breath sounds.  Musculoskeletal:     Cervical back: Neck supple.  Skin:    General: Skin is warm and dry.  Neurological:     Mental Status: She is  oriented to person, place, and time.     Cranial Nerves: No cranial nerve deficit.     Coordination: Coordination normal.  Psychiatric:        Mood and Affect: Mood normal.           Assessment & Plan:  Left facial numbness Assessment & Plan: With recent ED visit last week. Reviewed ED notes, labs, imaging.  Exam today at baseline.  Reviewed lipid panel from June 2024 which reveals LDL of 76. Given recent symptoms, would recommend lowering LDL further <70.   Increase rosuvastatin to 20 mg daily. Continue Zetia 10 mg daily.  Follow up with neurology as scheduled. Repeat lipids in 2 months.   Agreed to provide letter to work maximum of 6 hours daily for the next 2 weeks.   Bilateral hand numbness Assessment & Plan: Exam and HPI suggestive of carpal tunnel syndrome. Neuro exam today stable, no signs of acute CVA.  She will start wearing a brace to her wrists which was effective previously.   Offered to repeat/update EMG testing as it's not quite clear that she's undergone this in the past, she declines.   Vitamin B12 level pending today.  She will update.   Orders: -     Vitamin B12 -     Basic metabolic panel  Hyperlipidemia associated with type 2 diabetes mellitus (HCC) Assessment & Plan: Increasing rosuvastatin 20 mg daily for better LDL control.  Repeat lipids in LFTs in 2 to 3 months.  Orders: -     Rosuvastatin Calcium; Take 1 tablet (20 mg total) by mouth daily. for cholesterol.  Dispense: 90 tablet; Refill: 0  Essential hypertension Assessment & Plan: Stop hydrochlorothiazide 12.5 mg daily.  She will start monitoring BP at home and report if readings are at or above 130/80 consistently.  Doreene Nest, NP

## 2023-02-01 NOTE — Assessment & Plan Note (Addendum)
With recent ED visit last week. Reviewed ED notes, labs, imaging.  Exam today at baseline.  Reviewed lipid panel from June 2024 which reveals LDL of 76. Given recent symptoms, would recommend lowering LDL further <70.   Increase rosuvastatin to 20 mg daily. Continue Zetia 10 mg daily.  Follow up with neurology as scheduled. Repeat lipids in 2 months.   Agreed to provide letter to work maximum of 6 hours daily for the next 2 weeks.

## 2023-02-01 NOTE — Assessment & Plan Note (Signed)
Increasing rosuvastatin 20 mg daily for better LDL control.  Repeat lipids in LFTs in 2 to 3 months.

## 2023-02-02 LAB — VITAMIN B12: Vitamin B-12: 1140 pg/mL — ABNORMAL HIGH (ref 211–911)

## 2023-02-02 NOTE — Progress Notes (Signed)
[facility] [address] [place]. [place] [address] [phone]       OFFICE FOLLOW UP NOTE  [name] Date of Birth:  [birth year] Medical Record Number:  [id number]   Referring MD: Vernona Rieger, NP  Reason for Referral: Stroke  Chief Complaint  Patient presents with   Follow-up    Rm 3, here with daughter [place].  Pt is here for stroke follow up. Pt states she had a TIA on 01/26/2023. Pt states she is having a hard time focusing and staying on track. Pt states she is following up on her BP with PCP.       HPI:   Update 02/10/2023 JM: Patient previously seen 12/09/2021 and advised to follow-up as needed as stable from stroke standpoint.  Requested today's visit due to difficulty focusing/staying on track and recent TIA.  Accompanied by her daughter.  Reports having a TIA back in August, was seen at [facility] ED 8/28 for left facial numbness which resolved while in ED. CTH and MRI brain negative for acute stroke.  Lab work unremarkable.  Suspected recrudescence of prior stroke in setting of elevated BP although also possibility of TIA, placed on aspirin in addition to Plavix as outpatient follow-up.   Has not had any reoccurrence of left facial symptoms or new stroke/TIA symptoms.  She does complain of concentration and focusing difficulties as well as some occasional short-term memory loss. She is routinely followed by behavioral health, started on Strattera back in February but denies benefit and continues to struggle with significant anxiety and depression as well as insomnia. Currently working as a Geneticist, molecular, PCP recently reduced hours from 8 hours/day to 6 hours/day for the month of September.  She is currently on topiramate for headache prophylaxis, does have occasional tingling sensation headache about 2 times per week.  Also notes bilateral hand numbness intermittently, believes from carpal tunnel syndrome, plans on trying wrist braces.    She has remained on both aspirin and Plavix as well as Crestor and Zetia without side effects She is currently on Ozempic with noted weight loss, prior A1c 5.7 Reports fluctuation of blood pressure, she plans on routinely monitoring at home but has not yet started      History provided for reference purposes only Update 12/09/2021 JM: patient returns for 50 month stroke follow up unaccompanied  She has been doing well from a stroke standpoint with only occasional aphasia but more so jumbled up words trying to speak too quickly.  Reports only minimal weakness on the left side but does not interfere with daily activity or functioning.  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  Unfortunately, she has been dealing with great stress in setting of her brother's death unexpectedly after a massive heart attack.  She is currently on short-term leave and plans on returning back to work at the end of this month.  She has been dealing with significant depression as well as insomnia, headaches and concentration difficulties.  She is currently being seen by behavioral health.  Currently taking sertraline 150 mg daily and topiramate 25 mg twice daily.  Denies new stroke/TIA symptoms. Compliant on plavix and zetia, denies side effects.  Previously on Crestor but this is currently on hold in setting of recent evidence of liver lesion, plans on repeating abdominal ultrasound next month and possibly restarting if able. Bood pressure today 149/92. Routinely follows with PCP and cardiology.   No further concerns at this  time  Update 06/10/2021, JM: Ms Amber [name] is here today for a stroke follow-up accompanied by her daughter. Reports improving aphasia, left-sided weakness, and auditory comprehension deficits; denies new stroke/TIA symptoms. 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 She resumed driving around Thanksgiving and has been driving up to 30min but only locally. Remains on plavix, zetia, and crestor without side effects. BP today 110/72. Labs A1c 6.2 (01/02/21), LDL 76 (03/03/21). She will see her PCP Tuesday and her Cardiologist 1/23. Eval by [facility] neurology for second opinion on recurrent strokes - no further work up recommended as all since completed which reassured her.  No further concerns at this time  Initial visit 02/16/2021, Dr Pearlean Brownie: Amber [name] is a 52 year old woman with a past medical history significant for multiple remote cerebellar strokes, hyperlipidemia, anxiety, depression, asthma, tension headache who presented to the emergency department with acute onset expressive aphasia last known well was 1115 on 01/02/2021.  Her daughter noticed that she was not making sense when she spoke therefore she called 911.  On Dr. Darlyn Read examination patient was able to follow commands however was unable to express any meaningful speech.  Her words were jumbled together in a word salad.  She did not have any other significant deficits.  NIH stroke scale was a 4 (2 for questions and 2 for aphasia). Head CT showed NAICP. Patient not on anticoagulation.  After discussion of risk benefits she was given IV tPA uneventfully and showed improvement.  She had some transient worsening of her symptoms and CT scan of the head was repeated which showed no hemorrhagic conversion.  MRI scan of the brain personally reviewed showed acute infarction involving left middle frontal gyrus and left parietal.  A1c 6.2.  I discussed with milligrams percent.  CT angiogram of brain and neck did not show significant large vessel stenosis or occlusion transthoracic echo showed normal ejection fraction without intracardiac clot or PFO.  TEE also did not show PFO, clot or vegetation or cardiac source of embolism.  ESR was 5 mm.  HIV was negative.  Homocystine was normal anticardiolipin antibodies  were negative.  ANA was negative.  Urine drug screen was negative.  Patient had previously had a stroke in October 2016 also of cryptogenic etiology involving posterior left frontal cortical and subcortical white matter as well as MRI at that time and also shown a previous small remote right parietal cortical infarct.  She had a loop recorder inserted which did not show any paroxysmal A. fib and its battery ran out and is currently nonfunctional.  Patient had been on aspirin and during the current admission it was switched to Plavix which is tolerating well without bruising or bleeding.  She states her speech is almost back to normal though when she is excited or exhausted she occasionally struggles for words.  She still has some residual left-sided weakness and stiffness from a previous stroke.  ROS:   14 system review of systems is positive for those listed in HPI and all other systems negative  PMH:  Past Medical History:  Diagnosis Date   Anxiety    self reported   Asthma    Cellulitis, toe 11/18/2021   Left great toe  No abscess    Depression    controlled   Diabetes mellitus without complication (HCC)    Hyperlipidemia    controlled with medication   IBS (irritable bowel syndrome)  Left shoulder pain 08/02/2018   Muscle cramps    Precordial pain 09/29/2014   Recurrent strokes (HCC)    Slurred speech    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   Stroke (HCC) 03/13/2015   Tension headache 11/18/2020   Viral upper respiratory tract infection 05/07/2022   Word finding difficulty 02/27/2018    Social History:  Social History   Socioeconomic History   Marital status: Single    Spouse name: Not on file   Number of children: Not on file   Years of education: Not on file   Highest education level: Not on file  Occupational History   Not on file  Tobacco Use   Smoking status: Never   Smokeless tobacco: Never  Vaping Use   Vaping status: Never Used  Substance and Sexual Activity    Alcohol use: Not Currently    Alcohol/week: 7.0 standard drinks of alcohol    Types: 7 Glasses of wine per week    Comment: red wine occasionally but not since stroke   Drug use: Not Currently    Types: Marijuana    Comment: edibles-twice weekly   Sexual activity: Not Currently  Other Topics Concern   Not on file  Social History Narrative   Originally from [place]   Family lives up here in [place]   Has one daughter.   Enjoys spending time shopping and spending time with family    Social Determinants of Health   Financial Resource Strain: Not on file  Food Insecurity: Not on file  Transportation Needs: Not on file  Physical Activity: Not on file  Stress: Not on file  Social Connections: Not on file  Intimate Partner Violence: Not on file    Medications:   Current Outpatient Medications on File Prior to Visit  Medication Sig Dispense Refill   albuterol (VENTOLIN HFA) 108 (90 Base) MCG/ACT inhaler Inhale 2 puffs into the lungs every 4 (four) hours as needed for wheezing or shortness of breath (cough, shortness of breath or wheezing.). 1 each 1   Ascorbic Acid (VITAMIN C) 1000 MG tablet Take 1 tablet (1,000 mg total) by mouth daily. 30 tablet 0   azelastine (ASTELIN) 0.1 % nasal spray Place 1 spray into both nostrils 2 (two) times daily. Use in each nostril as directed 30 mL 0   cholecalciferol (VITAMIN D3) 25 MCG (1000 UNIT) tablet Take 1 tablet (1,000 Units total) by mouth daily. 30 tablet 0   clopidogrel (PLAVIX) 75 MG tablet Take 1 tablet (75 mg total) by mouth daily. 90 tablet 3   cyclobenzaprine (FLEXERIL) 10 MG tablet Take 1 tablet (10 mg total) by mouth 3 (three) times daily as needed for muscle spasms. 30 tablet 0   ezetimibe (ZETIA) 10 MG tablet TAKE 1 TABLET BY MOUTH ONCE DAILY FOR CHOLESTEROL 90 tablet 1   Multiple Vitamins-Minerals (MULTIVITAMIN WITH MINERALS) tablet Take 1 tablet by mouth daily.     OZEMPIC, 0.25 OR 0.5 MG/DOSE, 2 MG/3ML SOPN INJECT  0.5MG  SUBCUTANEOUSLY ONCE A WEEK FOR  DIABETES 9 mL 0   pantoprazole (PROTONIX) 20 MG tablet Take 1 tablet (20 mg total) by mouth 2 (two) times daily before a meal. for heartburn. 180 tablet 0   rosuvastatin (CRESTOR) 20 MG tablet Take 1 tablet (20 mg total) by mouth daily. for cholesterol. 90 tablet 0   topiramate (TOPAMAX) 25 MG tablet TAKE 1 TABLET BY MOUTH ONCE DAILY FOR  HEADACHE  PREVENTION 90 tablet 3   No current facility-administered medications  on file prior to visit.    Allergies:   Allergies  Allergen Reactions   Cephalexin Hives and Rash    Can take Augmentin    Physical Exam Today's Vitals   02/07/23 0830  BP: (!) 138/98  Pulse: 74  Weight: 161 lb (73 kg)  Height: 5\' 2"  (1.575 m)   Body mass index is 29.45 kg/m.  General: well developed, well nourished middle-aged African-American lady, seated, anxious throughout visit Head: head normocephalic and atraumatic.   Neck: supple with no carotid or supraclavicular bruits Cardiovascular: regular rate and rhythm, no murmurs Musculoskeletal: no deformity Skin:  no rash/petichiae Vascular:  Normal pulses all extremities  Neurologic Exam Mental Status: Awake and fully alert. Oriented to place and time. Recent memory subjectively impaired and remote memory intact. Attention span, concentration and fund of knowledge appropriate during visit. Mood and affect appropriate. Unable to appreciate aphasia or dysarthria during conversation. Good comprehension, naming and repetition.  Highly anxious during visit. Cranial Nerves: Pupils equal, briskly reactive to light. Extraocular movements full without nystagmus. Visual fields full to confrontation. Hearing intact. Facial sensation intact. Face, tongue, palate moves normally and symmetrically.  Motor: Normal bulk and tone. Normal strength in all tested extremity muscles  Sensory.: intact to touch , pinprick , position and vibratory sensation.  Coordination: Rapid alternating movements  normal in all extremities. Finger-to-nose and heel-to-shin performed accurately bilaterally Gait and Station: Arises from chair without difficulty. Stance is normal. Gait demonstrates normal stride length and balance without use of AD.  Reflexes: 1+ and symmetric. Toes downgoing.         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  Remote history of L MCA cryptogenic stroke in October 2016.  Possible TIA 12/2022 after presenting to ED with transient left facial numbness.  Vascular risk factors of diabetes, hypertension, hyperlipidemia.       TIA Recurrent left MCA branch infarct, cryptogenic Discussed recent TIA possibly in setting of high blood pressure and suboptimal control of depression/anxiety Recommend completion of CTA head/neck in setting of recent TIA to complete stroke work up Placed on DAPT in ED, advised to discontinue aspirin as 3 weeks DAPT completed and no indication for prolonged DAPT therapy and continue on Plavix alone as well as continuation of Crestor and Zetia for secondary stroke prevention measures - refills to be managed by PCP Suspect concentration and focusing difficulties in setting of suboptimally controlled depression/anxiety.  Highly encouraged close follow-up with behavioral health for further management/recommendations.  Discussed importance of stress reduction exercises Can consider holding topiramate as this could be contributing to memory concerns as well as intermittent hand numbness. If symptoms improve, consider alternative headache prophylaxis such as propranolol or gabapentin but will defer to PCP Close follow up by PCP for secondary risk factors including BP goal<130/90, DM with A1c goal <7 and HLD with LDL goal<70  Stroke labs 10/2022: A1c 5.7, LDL 76 - has f/u end of month with PCP with plans on repeat lab work Sleep study negative for sleep apnea  08/2017 Hypercoagulable panel negative ILR 03/2015-07/2018 end of life - no evidence of afib  TEE negative  Bilateral hand numbness: Suspect in setting of nerve compression vs medication side effect although lower suspicion Agree with pursuing conservative treatment PCP to continue to monitor    No further recommendations from stroke standpoint at this time and can follow up as needed. Can continue to follow with PCP for stroke  risk factor management.     CC:  Doreene Nest, NP   I spent 50 minutes of face-to-face and non-face-to-face time with patient and daughter.  This included previsit chart review, lab review, study review, electronic health record documentation, patient education and discussion regarding above diagnoses and treatment plan and answered all other questions to patient satisfaction  Ihor Austin, [facility]  [facility] [address] [place], [place] [address]  Phone [phone] Fax [phone] Note: This document was prepared with digital dictation and possible smart phrase technology. Any transcriptional errors that result from this process are unintentional.

## 2023-02-03 DIAGNOSIS — I10 Essential (primary) hypertension: Secondary | ICD-10-CM

## 2023-02-07 ENCOUNTER — Ambulatory Visit (INDEPENDENT_AMBULATORY_CARE_PROVIDER_SITE_OTHER): Payer: BC Managed Care – PPO | Admitting: Adult Health

## 2023-02-07 ENCOUNTER — Encounter: Payer: Self-pay | Admitting: Adult Health

## 2023-02-07 VITALS — BP 138/98 | HR 74 | Ht 62.0 in | Wt 161.0 lb

## 2023-02-07 DIAGNOSIS — G459 Transient cerebral ischemic attack, unspecified: Secondary | ICD-10-CM

## 2023-02-07 DIAGNOSIS — I639 Cerebral infarction, unspecified: Secondary | ICD-10-CM | POA: Diagnosis not present

## 2023-02-07 DIAGNOSIS — R200 Anesthesia of skin: Secondary | ICD-10-CM

## 2023-02-07 DIAGNOSIS — F331 Major depressive disorder, recurrent, moderate: Secondary | ICD-10-CM

## 2023-02-07 NOTE — Patient Instructions (Addendum)
You will be called to schedule a CT scan of your head and neck vessels in setting of recent possible TIA  Can consider stopping topamax - this can cause issues with memory/cognition and numbness/tingling - this is a low dose so may not be causing any issues but unable to say for sure unless you try to stop. If symptoms improve some, could consider trying other treatment options such as propranolol or gabapentin which can be done by your PCP  Highly recommend close follow up with psychiatry and psychology for further discussion of concentration and focus difficulty as this is likely coming from uncontrolled depression and anxiety   Continue clopidogrel 75 mg daily  and Crestor and zetia for secondary stroke prevention  Please stop aspirin at this time as prolonged use of both aspirin and plavix is not recommended  Continue to follow up with PCP regarding blood pressure, cholesterol and diabetes management  Maintain strict control of hypertension with blood pressure goal below 130/90, diabetes with hemoglobin A1c goal below 7.0 % and cholesterol with LDL cholesterol (bad cholesterol) goal below 70 mg/dL.   Signs of a Stroke? Follow the BEFAST method:  Balance Watch for a sudden loss of balance, trouble with coordination or vertigo Eyes Is there a sudden loss of vision in one or both eyes? Or double vision?  Face: Ask the person to smile. Does one side of the face droop or is it numb?  Arms: Ask the person to raise both arms. Does one arm drift downward? Is there weakness or numbness of a leg? Speech: Ask the person to repeat a simple phrase. Does the speech sound slurred/strange? Is the person confused ? Time: If you observe any of these signs, call 911.        Thank you for coming to see [place] at [facility]. I hope we have been able to provide you high quality care today.  You may receive a patient satisfaction survey over the next few weeks. We would appreciate your  feedback and comments so that we may continue to improve ourselves and the health of our patients.     Stroke Prevention Some medical conditions and lifestyle choices can lead to a higher risk for a stroke. You can help to prevent a stroke by eating healthy foods and exercising. It also helps to not smoke and to manage any health problems you may have. How can this condition affect me? A stroke is an emergency. It should be treated right away. A stroke can lead to brain damage or threaten your life. There is a better chance of surviving and getting better after a stroke if you get medical help right away. What can increase my risk? The following medical conditions may increase your risk of a stroke: Diseases of the heart and blood vessels (cardiovascular disease). High blood pressure (hypertension). Diabetes. High cholesterol. Sickle cell disease. Problems with blood clotting. Being very overweight. Sleeping problems (obstructivesleep apnea). Other risk factors include: Being older than age 38. A history of blood clots, stroke, or mini-stroke (TIA). Race, ethnic background, or a family history of stroke. Smoking or using tobacco products. Taking birth control pills, especially if you smoke. Heavy alcohol and drug use. Not being active. What actions can I take to prevent this? Manage your health conditions High cholesterol. Eat a healthy diet. If this is not enough to manage your cholesterol, you may need to take medicines. Take medicines as told by your doctor. High blood pressure. Try to keep  your blood pressure below 130/80. If your blood pressure cannot be managed through a healthy diet and regular exercise, you may need to take medicines. Take medicines as told by your doctor. Ask your doctor if you should check your blood pressure at home. Have your blood pressure checked every year. Diabetes. Eat a healthy diet and get regular exercise. If your blood sugar (glucose)  cannot be managed through diet and exercise, you may need to take medicines. Take medicines as told by your doctor. Talk to your doctor about getting checked for sleeping problems. Signs of a problem can include: Snoring a lot. Feeling very tired. Make sure that you manage any other conditions you have. Nutrition  Follow instructions from your doctor about what to eat or drink. You may be told to: Eat and drink fewer calories each day. Limit how much salt (sodium) you use to 1,500 milligrams (mg) each day. Use only healthy fats for cooking, such as olive oil, canola oil, and sunflower oil. Eat healthy foods. To do this: Choose foods that are high in fiber. These include whole grains, and fresh fruits and vegetables. Eat at least 5 servings of fruits and vegetables a day. Try to fill one-half of your plate with fruits and vegetables at each meal. Choose low-fat (lean) proteins. These include low-fat cuts of meat, chicken without skin, fish, tofu, beans, and nuts. Eat low-fat dairy products. Avoid foods that: Are high in salt. Have saturated fat. Have trans fat. Have cholesterol. Are processed or pre-made. Count how many carbohydrates you eat and drink each day. Lifestyle If you drink alcohol: Limit how much you have to: 0-1 drink a day for women who are not pregnant. 0-2 drinks a day for men. Know how much alcohol is in your drink. In the [place] one drink equals one 12 oz bottle of beer ( ), one 5 oz glass of wine ( ), or one 1 oz glass of hard liquor (44mL). Do not smoke or use any products that have nicotine or tobacco. If you need help quitting, ask your doctor. Avoid secondhand smoke. Do not use drugs. Activity  Try to stay at a healthy weight. Get at least 30 minutes of exercise on most days, such as: Fast walking. Biking. Swimming. Medicines Take over-the-counter and prescription medicines only as told by your doctor. Avoid taking birth control pills. Talk to  your doctor about the risks of taking birth control pills if: You are over 85 years old. You smoke. You get very bad headaches. You have had a blood clot. Where to find more information American Stroke Association: www.strokeassociation.org Get help right away if: You or a loved one has any signs of a stroke. "BE FAST" is an easy way to remember the warning signs: B - Balance. Dizziness, sudden trouble walking, or loss of balance. E - Eyes. Trouble seeing or a change in how you see. F - Face. Sudden weakness or loss of feeling of the face. The face or eyelid may droop on one side. A - Arms. Weakness or loss of feeling in an arm. This happens all of a sudden and most often on one side of the body. S - Speech. Sudden trouble speaking, slurred speech, or trouble understanding what people say. T - Time. Time to call emergency services. Write down what time symptoms started. You or a loved one has other signs of a stroke, such as: A sudden, very bad headache with no known cause. Feeling like you may vomit (nausea). Vomiting. A seizure.  These symptoms may be an emergency. Get help right away. Call your local emergency services (911 in the [place] Do not wait to see if the symptoms will go away. Do not drive yourself to the hospital. Summary You can help to prevent a stroke by eating healthy, exercising, and not smoking. It also helps to manage any health problems you have. Do not smoke or use any products that contain nicotine or tobacco. Get help right away if you or a loved one has any signs of a stroke. This information is not intended to replace advice given to you by your health care provider. Make sure you discuss any questions you have with your health care provider. Document Revised: 04/19/2022 Document Reviewed: 04/19/2022 Elsevier Patient Education  2024 [employer].

## 2023-02-08 ENCOUNTER — Telehealth: Payer: Self-pay | Admitting: Adult Health

## 2023-02-08 DIAGNOSIS — F4322 Adjustment disorder with anxiety: Secondary | ICD-10-CM | POA: Diagnosis not present

## 2023-02-08 NOTE — Telephone Encounter (Signed)
CTA neck [place] Numbers: [id number] exp. 02/08/23-03/09/23, [employer] medicaid auth: [id number] exp. 02/08/23-03/25/23  CTA head [employer] Berkley Harvey: [id number] exp. 02/08/23-03/09/23, [employer] medicaid Berkley Harvey: [id number] exp. 02/08/23-03/09/23  sent to GI [phone]

## 2023-02-14 ENCOUNTER — Encounter: Payer: Self-pay | Admitting: Internal Medicine

## 2023-02-15 ENCOUNTER — Ambulatory Visit: Payer: BC Managed Care – PPO | Admitting: Physician Assistant

## 2023-02-20 NOTE — Telephone Encounter (Signed)
Can we set up a virtual visit for the 3:40 slot on Tuesday, September 24?

## 2023-02-21 ENCOUNTER — Telehealth: Payer: Self-pay

## 2023-02-21 NOTE — Telephone Encounter (Signed)
Dr Leone Payor I was getting ready to call and remind her to stop her Plavix for her upcoming procedure 02/28/2023 when I saw in the chart she had a TIA 01/26/2023 after her visit here Sir. Please advise if okay to proceed. Thank you Sir.

## 2023-02-21 NOTE — Telephone Encounter (Signed)
Have to cancel - I will need to sort out what next steps are but I think it is at least 3 mos before we can sedate and scope her.  Let her know and send this back to me and will come up with a better plan

## 2023-02-21 NOTE — Telephone Encounter (Signed)
[name] called back to confirm she got my message. I told her we will be back in touch.

## 2023-02-21 NOTE — Telephone Encounter (Signed)
I have called Amber [name] and left her a detailed message about Dr Leone Payor saying we have to cancel her procedure. I told her to please call me back so I can confirm she got this message. I cancelled her appointment.

## 2023-02-22 ENCOUNTER — Telehealth (INDEPENDENT_AMBULATORY_CARE_PROVIDER_SITE_OTHER): Payer: BC Managed Care – PPO | Admitting: Primary Care

## 2023-02-22 ENCOUNTER — Encounter: Payer: Self-pay | Admitting: Primary Care

## 2023-02-22 DIAGNOSIS — F411 Generalized anxiety disorder: Secondary | ICD-10-CM
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
## 2023-02-22 NOTE — Patient Instructions (Signed)
Meet with therapy today as scheduled.  Continue Zoloft and part time working hours.  It was a pleasure to see you today!

## 2023-02-23 DIAGNOSIS — F330 Major depressive disorder, recurrent, mild: Secondary | ICD-10-CM | POA: Diagnosis not present

## 2023-02-23 NOTE — Telephone Encounter (Signed)
She is actually a patient of Dr. Rhea Belton and was on my schedule because of her availability to do EGD  She had been asymptomatic (was having nausea and vomiting + some pain) but wanted to be sure her stomach was ok from what I can tell per Jennifer's note.  Given recent TIA issue I would have her follow-up with Victorino Dike or Dr. Rhea Belton next available if she is still interested in pursuing an EGD tiough if she feels ok it does not seem necessary  Am ccing Victorino Dike and Dr. Rhea Belton on this for their input as well

## 2023-02-24 NOTE — Telephone Encounter (Signed)
Agree Office visit with me or Amber [name] in 8-10 weeks post TIA

## 2023-02-24 NOTE — Telephone Encounter (Signed)
Patient has set up an appointment for 06/22/2022 at 3pm with Dr Erick Blinks.

## 2023-02-28 ENCOUNTER — Encounter: Payer: BC Managed Care – PPO | Admitting: Internal Medicine

## 2023-02-28 ENCOUNTER — Ambulatory Visit: Payer: BC Managed Care – PPO | Admitting: Cardiology

## 2023-03-01 ENCOUNTER — Ambulatory Visit
Admission: RE | Admit: 2023-03-01 | Discharge: 2023-03-01 | Disposition: A | Discharge status: Home / Self Care | Payer: BC Managed Care – PPO | Source: Ambulatory Visit | Attending: Adult Health | Admitting: Adult Health

## 2023-03-01 ENCOUNTER — Telehealth: Payer: Self-pay | Admitting: Primary Care

## 2023-03-01 ENCOUNTER — Other Ambulatory Visit: Payer: Self-pay | Admitting: Adult Health

## 2023-03-01 DIAGNOSIS — I6389 Other cerebral infarction: Secondary | ICD-10-CM | POA: Diagnosis not present

## 2023-03-01 DIAGNOSIS — G459 Transient cerebral ischemic attack, unspecified: Secondary | ICD-10-CM

## 2023-03-01 DIAGNOSIS — I639 Cerebral infarction, unspecified: Secondary | ICD-10-CM

## 2023-03-01 DIAGNOSIS — F331 Major depressive disorder, recurrent, moderate: Secondary | ICD-10-CM

## 2023-03-01 DIAGNOSIS — R200 Anesthesia of skin: Secondary | ICD-10-CM

## 2023-03-01 MED ORDER — IOPAMIDOL (ISOVUE-370) INJECTION 76%
75.0000 mL | Freq: Once | INTRAVENOUS | Status: AC | PRN
  Administered 2023-03-01: 75 mL via INTRAVENOUS

## 2023-03-01 NOTE — Telephone Encounter (Signed)
Patient dropped off document  work form  reduce hours for work . , to be filled out by provider. Patient requested to send it back via Call Patient to pick up within 5-days. Document is located in providers tray at front office.Please advise at Mobile [phone] (mobile)

## 2023-03-02 NOTE — Telephone Encounter (Signed)
Completed paperwork.  Need to clarify her part-time hours.  On her note she mentioned 6 hours weekly.  Is this correct?  Or does she mean 6 hours/day?  Form placed in [employer] in box.

## 2023-03-03 NOTE — Telephone Encounter (Signed)
Form has been adjusted to read part time, 6 hours per day.

## 2023-03-03 NOTE — Telephone Encounter (Signed)
Noted, can you fix on her form?

## 2023-03-16 DIAGNOSIS — F330 Major depressive disorder, recurrent, mild: Secondary | ICD-10-CM | POA: Diagnosis not present

## 2023-03-17 ENCOUNTER — Other Ambulatory Visit: Payer: Self-pay | Admitting: Primary Care

## 2023-03-17 DIAGNOSIS — E785 Hyperlipidemia, unspecified: Secondary | ICD-10-CM

## 2023-03-22 ENCOUNTER — Ambulatory Visit: Payer: BC Managed Care – PPO | Attending: Physician Assistant | Admitting: Medical

## 2023-03-22 ENCOUNTER — Telehealth: Payer: Self-pay | Admitting: Adult Health

## 2023-03-22 ENCOUNTER — Encounter: Payer: Self-pay | Admitting: Medical

## 2023-03-22 VITALS — BP 120/84 | HR 80 | Ht 62.0 in | Wt 161.0 lb

## 2023-03-22 DIAGNOSIS — E782 Mixed hyperlipidemia: Secondary | ICD-10-CM | POA: Diagnosis not present

## 2023-03-22 DIAGNOSIS — Z95818 Presence of other cardiac implants and grafts: Secondary | ICD-10-CM

## 2023-03-22 DIAGNOSIS — I639 Cerebral infarction, unspecified: Secondary | ICD-10-CM | POA: Diagnosis not present

## 2023-03-22 NOTE — Progress Notes (Signed)
Cardiology Office Note:    Date:  03/22/2023   ID:  Amber [name], DOB 05-04-1971, MRN [id number]  PCP:  Doreene Nest, NP  [facility] Cardiologist:  [name], MD  [facility] Electrophysiologist:  None   Referring MD: Doreene Nest, NP   Chief Complaint: 6 month follow-up  History of Present Illness:    Amber [name] is a 52 y.o. female with a hx of recurrent strokes, diabetes type 2, hyperlipidemia, COVID in 2022, asthma, IBS, anxiety who presents for follow-up.  Patient was admitted in 2016 with left MCA infarct with an embolic pattern secondary to unknown source.  Echo at that time showed EF of 50%, no wall motion abnormalities, grade 1 diastolic dysfunction, mild MR.  TEE showed EF of 45 to 50%, diffuse global hypokinesis, no PFO or ASD.  She subsequently underwent ILR implantation.  Device interrogation showed no evidence of arrhythmia.  She was readmitted in 2018 with CVA with echo at that time showing EF of 55 to 60%, normal wall motion, normal LV diastolic function, no PFO.  Admitted in August 2022 with recurrent stroke.  Repeat TEE showed no intracardiac thrombus or stent.  Linear echodensity in the ascending aorta was noted and felt to be most likely artifact.  Subsequent CT of the chest showed no significant abnormality.  She reestablish care with cardiology in September 2022 for evaluation of chest pain.  She had been seen in the ER recently but left without being seen due to prolonged wait.  Subsequent cardiac CTA showed calcium score of 0 with no CAD.  She was referred to EP for consideration of repeat ILR implantation.  Patient was wanting to resume work and deferred implantation of ILR.  Outpatient cardiac monitoring x 2 showed predominantly normal sinus rhythm, rare PACs, PVCs, no significant arrhythmia.  No evidence of A-fib or flutter.  Patient was last seen in the office in March 2024 and was doing well from a cardiac perspective was overall stable  from a cardiac perspective.  Today, the patient is overall doing well from a cardiac perspective. She denies chest pain, SOB. She was doing Ozempic, but stopped in July; she wants to restart this. She denies lower leg edema, lightheadedness or dizziness. She has small migraines. No palpitations or heart fluttering noted. She still has a loop recorder and is willing to see EP.   Past Medical History:  Diagnosis Date   Anxiety    self reported   Asthma    Cellulitis, toe 11/18/2021   Left great toe  No abscess    Depression    controlled   Diabetes mellitus without complication (HCC)    Hyperlipidemia    controlled with medication   IBS (irritable bowel syndrome)    Left shoulder pain 08/02/2018   Muscle cramps    Precordial pain 09/29/2014   Recurrent strokes (HCC)    Slurred speech    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   Stroke (HCC) 03/13/2015   Tension headache 11/18/2020   Viral upper respiratory tract infection 05/07/2022   Word finding difficulty 02/27/2018    Past Surgical History:  Procedure Laterality Date   ABDOMINAL HYSTERECTOMY  10/2006   bowel reconstruction  10/2006   with hysterectomy   CESAREAN SECTION  2005   EP IMPLANTABLE DEVICE N/A 03/17/2015   Procedure: Loop Recorder Insertion;  Surgeon: Will Jorja Loa, MD;  Location: [facility] INVASIVE CV LAB;  Service: Cardiovascular;  Laterality: N/A;   TEE WITHOUT CARDIOVERSION N/A 03/17/2015  Procedure: TRANSESOPHAGEAL ECHOCARDIOGRAM (TEE);  Surgeon: Chilton Si, MD;  Location: [facility] ENDOSCOPY;  Service: Cardiovascular;  Laterality: N/A;   TEE WITHOUT CARDIOVERSION N/A 01/06/2021   Procedure: TRANSESOPHAGEAL ECHOCARDIOGRAM (TEE);  Surgeon: [name], MD;  Location: [facility] ORS;  Service: Cardiovascular;  Laterality: N/A;   TOE SURGERY     Left 2nd metatarsal    Current Medications: Current Meds  Medication Sig   albuterol (VENTOLIN HFA) 108 (90 Base) MCG/ACT inhaler Inhale 2 puffs into the lungs every 4  (four) hours as needed for wheezing or shortness of breath (cough, shortness of breath or wheezing.).   Ascorbic Acid (VITAMIN C) 1000 MG tablet Take 1 tablet (1,000 mg total) by mouth daily.   azelastine (ASTELIN) 0.1 % nasal spray Place 1 spray into both nostrils 2 (two) times daily. Use in each nostril as directed   cholecalciferol (VITAMIN D3) 25 MCG (1000 UNIT) tablet Take 1 tablet (1,000 Units total) by mouth daily.   clopidogrel (PLAVIX) 75 MG tablet Take 1 tablet (75 mg total) by mouth daily.   cyclobenzaprine (FLEXERIL) 10 MG tablet Take 1 tablet (10 mg total) by mouth 3 (three) times daily as needed for muscle spasms.   ezetimibe (ZETIA) 10 MG tablet TAKE 1 TABLET BY MOUTH ONCE DAILY FOR CHOLESTEROL   Multiple Vitamins-Minerals (MULTIVITAMIN WITH MINERALS) tablet Take 1 tablet by mouth daily.   OZEMPIC, 0.25 OR 0.5 MG/DOSE, 2 MG/3ML SOPN INJECT 0.5MG  SUBCUTANEOUSLY ONCE A WEEK FOR  DIABETES   pantoprazole (PROTONIX) 20 MG tablet Take 1 tablet (20 mg total) by mouth 2 (two) times daily before a meal. for heartburn.   rosuvastatin (CRESTOR) 20 MG tablet Take 1 tablet (20 mg total) by mouth daily. for cholesterol.   sertraline (ZOLOFT) 100 MG tablet Take by mouth.   topiramate (TOPAMAX) 25 MG tablet TAKE 1 TABLET BY MOUTH ONCE DAILY FOR  HEADACHE  PREVENTION     Allergies:   Cephalexin   Social History   Socioeconomic History   Marital status: Single    Spouse name: Not on file   Number of children: Not on file   Years of education: Not on file   Highest education level: Not on file  Occupational History   Not on file  Tobacco Use   Smoking status: Never   Smokeless tobacco: Never  Vaping Use   Vaping status: Never Used  Substance and Sexual Activity   Alcohol use: Not Currently    Alcohol/week: 7.0 standard drinks of alcohol    Types: 7 Glasses of wine per week    Comment: red wine occasionally but not since stroke   Drug use: Not Currently    Types: Marijuana     Comment: edibles-twice weekly   Sexual activity: Not Currently  Other Topics Concern   Not on file  Social History Narrative   Originally from [place]   Family lives up here in [place]   Has one daughter.   Enjoys spending time shopping and spending time with family    Social Determinants of Health   Financial Resource Strain: Not on file  Food Insecurity: Not on file  Transportation Needs: Not on file  Physical Activity: Not on file  Stress: Not on file  Social Connections: Not on file     Family History: The patient's family history includes Arthritis in her father; Diabetes in her father, paternal grandfather, and paternal grandmother; Heart attack in her maternal grandfather and paternal grandfather; Heart attack (age of onset: 17) in  her brother; Heart failure in her father; Hyperlipidemia in her father, maternal grandfather, and mother; Hypertension in her father; Prostate cancer in her father, maternal grandfather, and maternal uncle; Stroke in her paternal grandmother. There is no history of Colon cancer, Colon polyps, Stomach cancer, Esophageal cancer, or Pancreatic cancer.  ROS:   Please see the history of present illness.     All other systems reviewed and are negative.  EKGs/Labs/Other Studies Reviewed:    The following studies were reviewed today:  2D echo 03/14/2015: - Left ventricle: The cavity size was normal. Systolic function was    normal. The estimated ejection fraction was 50%. Wall motion was    normal; there were no regional wall motion abnormalities. Doppler    parameters are consistent with abnormal left ventricular    relaxation (grade 1 diastolic dysfunction). Doppler parameters    are consistent with elevated ventricular end-diastolic filling    pressure.  - Aortic valve: Structurally normal valve. There was no    regurgitation.  - Mitral valve: Structurally normal valve. There was mild    regurgitation.  - Left atrium: The atrium  was normal in size.  - Right ventricle: The cavity size was normal. Wall thickness was    normal. Systolic function was normal.  - Right atrium: The atrium was normal in size.  - Tricuspid valve: There was mild regurgitation.  - Pulmonic valve: There was trivial regurgitation.  - Inferior vena cava: The vessel was normal in size. The    respirophasic diameter changes were in the normal range (>= 50%),    consistent with normal central venous pressure.  - Pericardium, extracardiac: The pericardium was normal in    appearance.   Impressions:   - LVEF is mildly impaired with diffuse hypokinesis.    Abnormal relaxation with mildly elevated filling pressures.    Mild MR and TR. __________   Transcranial doppler with bubbles 03/14/2015: Negative TCD Bubble study.No high intensity transient signals  (HITS) heard at rest or with valsalva. Therefore, there is no  apparent PFO. __________   TEE 03/17/2015: - Left ventricle: Systolic function was mildly reduced. The    estimated ejection fraction was in the range of 45% to 50%.    Diffuse hypokinesis.  - Left atrium: No evidence of thrombus in the atrial cavity or    appendage.  - Right atrium: No evidence of thrombus in the atrial cavity or    appendage.  - Atrial septum: No defect or patent foramen ovale was identified.    Echo contrast study showed no right-to-left atrial level shunt,    at baseline or with provocation. Negative saline microcavitation    study. ___________   2D echo 06/11/2016: - Left ventricle: The cavity size was normal. Wall thickness was    normal. Systolic function was normal. The estimated ejection    fraction was in the range of 55% to 60%. Wall motion was normal;    there were no regional wall motion abnormalities. Left    ventricular diastolic function parameters were normal.  - Atrial septum: No defect or patent foramen ovale was identified.   Impressions:   - No cardiac source of emboli was  indentified. __________   2D echo 01/04/2021: 1. Left ventricular ejection fraction, by estimation, is 55 to 60%. The  left ventricle has normal function. The left ventricle has no regional  wall motion abnormalities. Left ventricular diastolic parameters are  consistent with Grade I diastolic  dysfunction (impaired relaxation).   2.  Right ventricular systolic function is normal. The right ventricular  size is normal.   3. The mitral valve is degenerative. Mild mitral valve regurgitation.   4. The aortic valve is tricuspid. Aortic valve regurgitation is not  visualized.   5. Agitated saline contrast bubble study was negative, with no evidence  of any interatrial shunt. __________   TEE 01/06/2021: 1. Left ventricular ejection fraction, by estimation, is 55 to 60%. The  left ventricle has normal function.   2. Right ventricular systolic function is normal. The right ventricular  size is normal.   3. No left atrial/left atrial appendage thrombus was detected. The LAA  emptying velocity was 76 cm/s.   4. The mitral valve is abnormal. Mild mitral valve regurgitation.   5. The aortic valve is tricuspid. Aortic valve regurgitation is not  visualized.   6. There is a linear echodensity in and adjacent to the ascending aorta  that most likely represents artifact. However, given cryptogenic strokes,  further evaluation with CTA could be helpful to exclude aortic pathology.   7. Agitated saline contrast bubble study was negative, with no evidence  of any interatrial shunt. __________   Coronary artery CTA 02/12/2021: FINDINGS: Aorta:  Normal size.  No calcifications.  No dissection.   Aortic Valve:  Trileaflet.  No calcifications.   Coronary Arteries:  Normal coronary origin.  Right dominance.   RCA is a dominant artery that gives rise to PDA and PLA. There is no plaque.   Left main is a large artery that gives rise to LAD and LCX arteries.   LAD has no plaque.   LCX is a  non-dominant artery that gives rise to two obtuse marginal branches. There is no plaque.   Other findings:   Normal pulmonary vein drainage into the left atrium.   Normal left atrial appendage without a thrombus.   Normal size of the pulmonary artery.   IMPRESSION: 1. Normal coronary calcium score of 0. Patient is low risk for coronary events. 2. Normal coronary origin with right dominance. 3. No evidence of CAD. 4. CAD-RADS 0. Consider non-atherosclerotic causes of chest pain. __________   Luci Bank patch 05/2021: The patient was monitored for 14 days. The predominant rhythm was sinus with an average rate of 87 bpm (range 52-161 bpm). There were rare PACs and PVCs. No sustained arrhythmia or prolonged pause was identified. Single patient triggered event corresponds to normal sinus rhythm.   Predominantly sinus rhythm with rare PACs and PVCs.  No significant arrhythmia identified. __________   Luci Bank patch 05/2021: The patient was monitored for 13 days, 9 hours. The predominant rhythm was sinus with an average rate of 90 bpm (range 51-184 bpm in sinus). There were rare PACs and PVCs. Single atrial run lasting 5 beats occurred with a maximum rate of 193 bpm. No sustained arrhythmia or prolonged pause was observed. Patient triggered event corresponds to sinus rhythm with artifact and PVC.   Predominantly sinus rhythm with rare PACs and PVCs as well as single brief episode of PSVT.  No atrial fibrillation/flutter observed.    EKG:  EKG is not ordered today.    Recent Labs: 11/11/2022: TSH 2.13 01/26/2023: ALT 36; Hemoglobin 15.5; Platelets 255 02/01/2023: BUN 13; Creatinine, Ser 1.14; Potassium 3.8; Sodium 142  Recent Lipid Panel    Component Value Date/Time   CHOL 139 11/11/2022 0816   CHOL 184 08/28/2019 1537   TRIG 62.0 11/11/2022 0816   HDL 50.10 11/11/2022 0816   HDL 75 08/28/2019 1537  CHOLHDL 3 11/11/2022 0816   VLDL 12.4 11/11/2022 0816   LDLCALC 76 11/11/2022 0816    LDLCALC 85 08/28/2019 1537   LDLDIRECT 160 (H) 02/24/2022 1651    Physical Exam:    VS:  BP 120/84 (BP Location: Left Arm, Patient Position: Sitting, Cuff Size: Normal)   Pulse 80   Ht 5\' 2"  (1.575 m)   Wt 161 lb (73 kg)   SpO2 98%   BMI 29.45 kg/m     Wt Readings from Last 3 Encounters:  03/22/23 161 lb (73 kg)  02/07/23 161 lb (73 kg)  02/01/23 159 lb (72.1 kg)     GEN:  Well nourished, well developed in no acute distress HEENT: Normal NECK: No JVD; No carotid bruits LYMPHATICS: No lymphadenopathy CARDIAC: RRR, no murmurs, rubs, gallops RESPIRATORY:  Clear to auscultation without rales, wheezing or rhonchi  ABDOMEN: Soft, non-tender, non-distended MUSCULOSKELETAL:  No edema; No deformity  SKIN: Warm and dry NEUROLOGIC:  Alert and oriented x 3 PSYCHIATRIC:  Normal affect   ASSESSMENT:    1. Recurrent strokes (HCC)   2. Status post placement of implantable loop recorder   3. Hyperlipidemia, mixed    PLAN:    In order of problems listed above:  Recurrent CVAs Patient had recent CT head and MRI brain in August for tongue tingling that was negative for acute stroke and again in October that is still pending. She is following with neurology. Prior extensive cardiac work up with normal echocardiograms x 2, transcranial Doppler, ILR, heart monitor x 2 have been unrevealing for cardiac etiology for recurrent CVAs.  Continue Plavix, Zetia and Crestor.  S/p ILR Patient still has implanted loop recorder that was unremarkable, last device interrogation in 2020.  I will refer to EP to further discuss ILR management.  HLD LDL 76, continue Crestor 20 mg daily and Zetia 10 mg daily.  Disposition: Follow up in 1 year(s) with MD/APP    Signed, Shirel Mallis David Stall, PA-C  03/22/2023 4:04 PM    [facility]

## 2023-03-22 NOTE — Telephone Encounter (Signed)
Noted! Thank you

## 2023-03-22 NOTE — Patient Instructions (Signed)
Medication Instructions:  Your physician recommends that you continue on your current medications as directed. Please refer to the Current Medication list given to you today.   *If you need a refill on your cardiac medications before your next appointment, please call your pharmacy*   Lab Work: No labs ordered today    Testing/Procedures: No test ordered today    Follow-Up: At [facility], you and your health needs are our priority.  As part of our continuing mission to provide you with exceptional heart care, we have created designated Provider Care Teams.  These Care Teams include your primary Cardiologist (physician) and Advanced Practice Providers (APPs -  Physician Assistants and Nurse Practitioners) who all work together to provide you with the care you need, when you need it.  We recommend signing up for the patient portal called "MyChart".  Sign up information is provided on this After Visit Summary.  MyChart is used to connect with patients for Virtual Visits (Telemedicine).  Patients are able to view lab/test results, encounter notes, upcoming appointments, etc.  Non-urgent messages can be sent to your provider as well.   To learn more about what you can do with MyChart, go to [email or web address].    Your next appointment:   1 year(s)  Provider:   You may see [name], MD or one of the following Advanced Practice Providers on your designated Care Team:   Nicolasa Ducking, NP Eula Listen, PA-C Cadence Fransico Michael, PA-C Charlsie Quest, NP

## 2023-03-22 NOTE — Telephone Encounter (Signed)
Pt is asking to be called with results to her MRI from 10-01

## 2023-03-22 NOTE — Telephone Encounter (Signed)
GI is behind on reading images. I contacted pt and informed her. She verbally understood.

## 2023-03-22 NOTE — Telephone Encounter (Addendum)
This doesn't look like its been read yet? I'll contact GI for CT head

## 2023-04-07 ENCOUNTER — Other Ambulatory Visit: Payer: Self-pay | Admitting: Primary Care

## 2023-04-07 ENCOUNTER — Ambulatory Visit: Payer: BC Managed Care – PPO | Admitting: Primary Care

## 2023-04-07 ENCOUNTER — Encounter: Payer: Self-pay | Admitting: Primary Care

## 2023-04-07 ENCOUNTER — Ambulatory Visit: Payer: BC Managed Care – PPO

## 2023-04-07 VITALS — BP 122/84 | HR 82 | Temp 98.0°F | Ht 62.0 in | Wt 161.5 lb

## 2023-04-07 DIAGNOSIS — R7989 Other specified abnormal findings of blood chemistry: Secondary | ICD-10-CM

## 2023-04-07 DIAGNOSIS — R1032 Left lower quadrant pain: Secondary | ICD-10-CM | POA: Diagnosis not present

## 2023-04-07 DIAGNOSIS — D751 Secondary polycythemia: Secondary | ICD-10-CM | POA: Diagnosis not present

## 2023-04-07 DIAGNOSIS — R1031 Right lower quadrant pain: Secondary | ICD-10-CM

## 2023-04-07 DIAGNOSIS — Z23 Encounter for immunization: Secondary | ICD-10-CM

## 2023-04-07 DIAGNOSIS — R350 Frequency of micturition: Secondary | ICD-10-CM

## 2023-04-07 DIAGNOSIS — D72819 Decreased white blood cell count, unspecified: Secondary | ICD-10-CM | POA: Diagnosis not present

## 2023-04-07 HISTORY — DX: Left lower quadrant pain: R10.32

## 2023-04-07 LAB — CBC WITH DIFFERENTIAL/PLATELET
Basophils Absolute: 0 10*3/uL (ref 0.0–0.1)
Basophils Relative: 0.9 % (ref 0.0–3.0)
Eosinophils Absolute: 0.1 10*3/uL (ref 0.0–0.7)
Eosinophils Relative: 3.1 % (ref 0.0–5.0)
HCT: 46 % (ref 36.0–46.0)
Hemoglobin: 14.7 g/dL (ref 12.0–15.0)
Lymphocytes Relative: 44.8 % (ref 12.0–46.0)
Lymphs Abs: 1.6 10*3/uL (ref 0.7–4.0)
MCHC: 32 g/dL (ref 30.0–36.0)
MCV: 89.2 fL (ref 78.0–100.0)
Monocytes Absolute: 0.3 10*3/uL (ref 0.1–1.0)
Monocytes Relative: 7.5 % (ref 3.0–12.0)
Neutro Abs: 1.5 10*3/uL (ref 1.4–7.7)
Neutrophils Relative %: 43.7 % (ref 43.0–77.0)
Platelets: 240 10*3/uL (ref 150.0–400.0)
RBC: 5.16 Mil/uL — ABNORMAL HIGH (ref 3.87–5.11)
RDW: 14.3 % (ref 11.5–15.5)
WBC: 3.5 10*3/uL — ABNORMAL LOW (ref 4.0–10.5)

## 2023-04-07 LAB — BASIC METABOLIC PANEL
BUN: 13 mg/dL (ref 6–23)
CO2: 28 meq/L (ref 19–32)
Calcium: 9.9 mg/dL (ref 8.4–10.5)
Chloride: 106 meq/L (ref 96–112)
Creatinine, Ser: 1.05 mg/dL (ref 0.40–1.20)
GFR: 61.94 mL/min (ref >60.00)
Glucose, Bld: 94 mg/dL (ref 70–99)
Potassium: 4 meq/L (ref 3.5–5.1)
Sodium: 140 meq/L (ref 135–145)

## 2023-04-07 LAB — POC URINALSYSI DIPSTICK (AUTOMATED)
Bilirubin, UA: NEGATIVE
Blood, UA: NEGATIVE
Glucose, UA: NEGATIVE
Ketones, UA: NEGATIVE
Leukocytes, UA: NEGATIVE
Nitrite, UA: NEGATIVE
Protein, UA: NEGATIVE
Spec Grav, UA: 1.015 (ref 1.010–1.025)
Urobilinogen, UA: 0.2 U/dL
pH, UA: 7 (ref 5.0–8.0)

## 2023-04-07 NOTE — Assessment & Plan Note (Addendum)
Unclear etiology.   She does have moderate tender tenderness to bilateral lower abdominal regions.  Reviewed colonoscopy from 2022 which does not show diverticulosis.  Urinalysis today negative.  Stat CT abdomen pelvis ordered and pending. BMP and CBC with differential ordered and pending.  Await results. She is stable for outpatient treatment.

## 2023-04-07 NOTE — Patient Instructions (Signed)
Stop by the lab prior to leaving today. I will notify you of your results once received.   You will be contacted today regarding your CT scan.  It was a pleasure to see you today!

## 2023-04-07 NOTE — Progress Notes (Signed)
Subjective:    Patient ID: Amber [name], female    DOB: Jul 24, 1970, 52 y.o.   MRN: [id number]  Abdominal Pain Associated symptoms include diarrhea and frequency. Pertinent negatives include no constipation, dysuria, fever, hematuria, nausea or vomiting.    Amber [name] is a very pleasant 52 y.o. female with a history of hypertension, GERD, IBS, hyperlipidemia, controlled type 2 diabetes, GAD, CVA, frequent headaches, MDD, complete hysterectomy who presents today to discuss abdominal pain.  Symptom onset 2 weeks ago with intermittent left lower quadrant abdominal pain for which she describes as a "shooting and sharp pain". Also with diarrhea occurring once weekly, urinary frequency but has increased intake of water.   Her pain bothers her with rest, walking, and movement. She notices her pain when bending forward.   She denies nausea, vomiting, constipation, changes in appetite, dysuria, flank pain, radiation of abdominal pain. She is managed on Ozempic, is taking it every other week due to side effects of constant diarrhea with nausea and vomiting. Her pain today is different than anything else she's felt previously.   She had a total hysterectomy 19 years ago, questions if she is experiencing scar tissue.  She is not sexually active.   Review of Systems  Constitutional:  Negative for fever.  Gastrointestinal:  Positive for abdominal pain and diarrhea. Negative for blood in stool, constipation, nausea and vomiting.  Genitourinary:  Positive for frequency. Negative for dysuria, hematuria, pelvic pain and vaginal discharge.         Past Medical History:  Diagnosis Date   Anxiety    self reported   Asthma    Cellulitis, toe 11/18/2021   Left great toe  No abscess    Depression    controlled   Diabetes mellitus without complication (HCC)    Hyperlipidemia    controlled with medication   IBS (irritable bowel syndrome)    Left shoulder pain 08/02/2018   Muscle cramps     Precordial pain 09/29/2014   Recurrent strokes (HCC)    Slurred speech    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   Stroke (HCC) 03/13/2015   Tension headache 11/18/2020   Viral upper respiratory tract infection 05/07/2022   Word finding difficulty 02/27/2018    Social History   Socioeconomic History   Marital status: Single    Spouse name: Not on file   Number of children: Not on file   Years of education: Not on file   Highest education level: Not on file  Occupational History   Not on file  Tobacco Use   Smoking status: Never   Smokeless tobacco: Never  Vaping Use   Vaping status: Never Used  Substance and Sexual Activity   Alcohol use: Not Currently    Alcohol/week: 7.0 standard drinks of alcohol    Types: 7 Glasses of wine per week    Comment: red wine occasionally but not since stroke   Drug use: Not Currently    Types: Marijuana    Comment: edibles-twice weekly   Sexual activity: Not Currently  Other Topics Concern   Not on file  Social History Narrative   Originally from [place]   Family lives up here in [place]   Has one daughter.   Enjoys spending time shopping and spending time with family    Social Determinants of Health   Financial Resource Strain: Not on file  Food Insecurity: Not on file  Transportation Needs: Not on file  Physical Activity: Not on file  Stress: Not  on file  Social Connections: Not on file  Intimate Partner Violence: Not on file    Past Surgical History:  Procedure Laterality Date   ABDOMINAL HYSTERECTOMY  10/2006   bowel reconstruction  10/2006   with hysterectomy   CESAREAN SECTION  2005   EP IMPLANTABLE DEVICE N/A 03/17/2015   Procedure: Loop Recorder Insertion;  Surgeon: Will Jorja Loa, MD;  Location: [facility];  Service: Cardiovascular;  Laterality: N/A;   TEE WITHOUT CARDIOVERSION N/A 03/17/2015   Procedure: TRANSESOPHAGEAL ECHOCARDIOGRAM (TEE);  Surgeon: Chilton Si, MD;  Location: [facility]  ENDOSCOPY;  Service: Cardiovascular;  Laterality: N/A;   TEE WITHOUT CARDIOVERSION N/A 01/06/2021   Procedure: TRANSESOPHAGEAL ECHOCARDIOGRAM (TEE);  Surgeon: [name], MD;  Location: [facility] ORS;  Service: Cardiovascular;  Laterality: N/A;   TOE SURGERY     Left 2nd metatarsal    Family History  Problem Relation Age of Onset   Hyperlipidemia Mother    Heart failure Father    Arthritis Father    Diabetes Father    Hypertension Father    Hyperlipidemia Father    Prostate cancer Father    Heart attack Brother 69   Prostate cancer Maternal Uncle        x 3   Heart attack Maternal Grandfather    Hyperlipidemia Maternal Grandfather    Prostate cancer Maternal Grandfather    Diabetes Paternal Grandmother    Stroke Paternal Grandmother    Diabetes Paternal Grandfather    Heart attack Paternal Grandfather    Colon cancer Neg Hx    Colon polyps Neg Hx    Stomach cancer Neg Hx    Esophageal cancer Neg Hx    Pancreatic cancer Neg Hx     Allergies  Allergen Reactions   Cephalexin Hives and Rash    Can take Augmentin    Current Outpatient Medications on File Prior to Visit  Medication Sig Dispense Refill   albuterol (VENTOLIN HFA) 108 (90 Base) MCG/ACT inhaler Inhale 2 puffs into the lungs every 4 (four) hours as needed for wheezing or shortness of breath (cough, shortness of breath or wheezing.). 1 each 1   Ascorbic Acid (VITAMIN C) 1000 MG tablet Take 1 tablet (1,000 mg total) by mouth daily. 30 tablet 0   azelastine (ASTELIN) 0.1 % nasal spray Place 1 spray into both nostrils 2 (two) times daily. Use in each nostril as directed 30 mL 0   cholecalciferol (VITAMIN D3) 25 MCG (1000 UNIT) tablet Take 1 tablet (1,000 Units total) by mouth daily. 30 tablet 0   clopidogrel (PLAVIX) 75 MG tablet Take 1 tablet (75 mg total) by mouth daily. 90 tablet 3   cyclobenzaprine (FLEXERIL) 10 MG tablet Take 1 tablet (10 mg total) by mouth 3 (three) times daily as needed for muscle spasms. 30  tablet 0   ezetimibe (ZETIA) 10 MG tablet TAKE 1 TABLET BY MOUTH ONCE DAILY FOR CHOLESTEROL 90 tablet 1   Multiple Vitamins-Minerals (MULTIVITAMIN WITH MINERALS) tablet Take 1 tablet by mouth daily.     OZEMPIC, 0.25 OR 0.5 MG/DOSE, 2 MG/3ML SOPN INJECT 0.5MG  SUBCUTANEOUSLY ONCE A WEEK FOR  DIABETES 9 mL 0   pantoprazole (PROTONIX) 20 MG tablet Take 1 tablet (20 mg total) by mouth 2 (two) times daily before a meal. for heartburn. 180 tablet 0   rosuvastatin (CRESTOR) 20 MG tablet Take 1 tablet (20 mg total) by mouth daily. for cholesterol. 90 tablet 0   sertraline (ZOLOFT) 100 MG tablet Take by mouth.  topiramate (TOPAMAX) 25 MG tablet TAKE 1 TABLET BY MOUTH ONCE DAILY FOR  HEADACHE  PREVENTION 90 tablet 3   No current facility-administered medications on file prior to visit.    BP 122/84   Pulse 82   Temp 98 F (36.7 C) (Oral)   Ht 5\' 2"  (1.575 m)   Wt 161 lb 8 oz (73.3 kg)   SpO2 97%   BMI 29.54 kg/m  Objective:   Physical Exam Cardiovascular:     Rate and Rhythm: Normal rate and regular rhythm.  Pulmonary:     Effort: Pulmonary effort is normal.     Breath sounds: Normal breath sounds.  Abdominal:     General: Bowel sounds are normal.     Palpations: Abdomen is soft.     Tenderness: There is abdominal tenderness in the right lower quadrant, suprapubic area and left lower quadrant.  Musculoskeletal:     Cervical back: Neck supple.  Skin:    General: Skin is warm and dry.  Neurological:     Mental Status: She is alert and oriented to person, place, and time.  Psychiatric:        Mood and Affect: Mood normal.           Assessment & Plan:  Left lower quadrant abdominal pain Assessment & Plan: Unclear etiology.   She does have moderate tender tenderness to bilateral lower abdominal regions.  Reviewed colonoscopy from 2022 which does not show diverticulosis.  Urinalysis today negative.  Stat CT abdomen pelvis ordered and pending. BMP and CBC with differential  ordered and pending.  Await results. She is stable for outpatient treatment.  Orders: -     Basic metabolic panel -     CBC with Differential/Platelet -     CT ABDOMEN PELVIS W CONTRAST; Future  Urinary frequency -     POCT Urinalysis Dipstick (Automated)  Encounter for immunization -     Flu vaccine trivalent PF, 6mos and older(Flulaval,Afluria,Fluarix,Fluzone)  Right lower quadrant abdominal pain -     CT ABDOMEN PELVIS W CONTRAST; Future        Doreene Nest, NP

## 2023-04-08 ENCOUNTER — Ambulatory Visit
Admission: RE | Admit: 2023-04-08 | Discharge: 2023-04-08 | Disposition: A | Discharge status: Home / Self Care | Payer: BC Managed Care – PPO | Source: Ambulatory Visit | Attending: Primary Care | Admitting: Primary Care

## 2023-04-08 DIAGNOSIS — R1031 Right lower quadrant pain: Secondary | ICD-10-CM | POA: Diagnosis not present

## 2023-04-08 DIAGNOSIS — R1032 Left lower quadrant pain: Secondary | ICD-10-CM | POA: Diagnosis not present

## 2023-04-08 DIAGNOSIS — R932 Abnormal findings on diagnostic imaging of liver and biliary tract: Secondary | ICD-10-CM | POA: Diagnosis not present

## 2023-04-08 DIAGNOSIS — Z90710 Acquired absence of both cervix and uterus: Secondary | ICD-10-CM | POA: Diagnosis not present

## 2023-04-08 DIAGNOSIS — R10814 Left lower quadrant abdominal tenderness: Secondary | ICD-10-CM | POA: Diagnosis not present

## 2023-04-08 LAB — PATHOLOGIST SMEAR REVIEW

## 2023-04-08 MED ORDER — IOHEXOL 300 MG/ML  SOLN
100.0000 mL | Freq: Once | INTRAMUSCULAR | Status: AC | PRN
  Administered 2023-04-08: 100 mL via INTRAVENOUS

## 2023-04-27 ENCOUNTER — Ambulatory Visit: Payer: BC Managed Care – PPO | Admitting: Primary Care

## 2023-04-27 ENCOUNTER — Encounter: Payer: Self-pay | Admitting: Primary Care

## 2023-04-27 VITALS — BP 122/86 | HR 76 | Temp 97.0°F | Ht 62.0 in | Wt 163.0 lb

## 2023-04-27 DIAGNOSIS — F411 Generalized anxiety disorder: Secondary | ICD-10-CM | POA: Diagnosis not present

## 2023-04-27 DIAGNOSIS — F331 Major depressive disorder, recurrent, moderate: Secondary | ICD-10-CM | POA: Diagnosis not present

## 2023-04-27 DIAGNOSIS — E1169 Type 2 diabetes mellitus with other specified complication: Secondary | ICD-10-CM

## 2023-04-27 DIAGNOSIS — E785 Hyperlipidemia, unspecified: Secondary | ICD-10-CM

## 2023-04-27 DIAGNOSIS — E1165 Type 2 diabetes mellitus with hyperglycemia: Secondary | ICD-10-CM

## 2023-04-27 DIAGNOSIS — Z7985 Long-term (current) use of injectable non-insulin antidiabetic drugs: Secondary | ICD-10-CM

## 2023-04-27 DIAGNOSIS — Z23 Encounter for immunization: Secondary | ICD-10-CM | POA: Diagnosis not present

## 2023-04-27 LAB — LIPID PANEL
Cholesterol: 160 mg/dL (ref 0–200)
HDL: 72.9 mg/dL (ref >39.00)
LDL Cholesterol: 76 mg/dL (ref 0–99)
NonHDL: 87.57
Total CHOL/HDL Ratio: 2
Triglycerides: 58 mg/dL (ref 0.0–149.0)
VLDL: 11.6 mg/dL (ref 0.0–40.0)

## 2023-04-27 LAB — POCT GLYCOSYLATED HEMOGLOBIN (HGB A1C): Hemoglobin A1C: 6 % — AB (ref 4.0–5.6)

## 2023-04-27 LAB — CBC
HCT: 43.2 % (ref 36.0–46.0)
Hemoglobin: 14 g/dL (ref 12.0–15.0)
MCHC: 32.5 g/dL (ref 30.0–36.0)
MCV: 89 fL (ref 78.0–100.0)
Platelets: 217 10*3/uL (ref 150.0–400.0)
RBC: 4.86 Mil/uL (ref 3.87–5.11)
RDW: 14.2 % (ref 11.5–15.5)
WBC: 3.2 10*3/uL — ABNORMAL LOW (ref 4.0–10.5)

## 2023-04-27 LAB — HEPATIC FUNCTION PANEL
ALT: 32 U/L (ref 0–35)
AST: 23 U/L (ref 0–37)
Albumin: 4.1 g/dL (ref 3.5–5.2)
Alkaline Phosphatase: 70 U/L (ref 39–117)
Bilirubin, Direct: 0.1 mg/dL (ref 0.0–0.3)
Total Bilirubin: 0.5 mg/dL (ref 0.2–1.2)
Total Protein: 6.6 g/dL (ref 6.0–8.3)

## 2023-04-27 LAB — MICROALBUMIN / CREATININE URINE RATIO
Creatinine,U: 114.9 mg/dL
Microalb Creat Ratio: 0.6 mg/g (ref 0.0–30.0)
Microalb, Ur: <0.7 mg/dL (ref 0.0–1.9)

## 2023-04-27 MED ORDER — TIRZEPATIDE 2.5 MG/0.5ML ~~LOC~~ SOAJ: 2.5000 mg | SUBCUTANEOUS | 0 refills | Status: DC

## 2023-04-27 NOTE — Patient Instructions (Signed)
Stop by the lab prior to leaving today. I will notify you of your results once received.   Schedule your physical for mid June 2025.  It was a pleasure to see you today!

## 2023-04-27 NOTE — Assessment & Plan Note (Signed)
Agree to extend FMLA hours. She is working 6 hours/day, 5 days/week.  Work Physicist, medical provided.

## 2023-04-27 NOTE — Assessment & Plan Note (Signed)
Slightly deteriorated but still under control with A1c of 6.0 today. Remain off Ozempic due to side effects.  Start Mounjaro 2.5 mg weekly x 4 weeks, then increase to 5 mg weekly thereafter.  Foot exam today. Urine microalbumin due and pending.  Follow up in 6 months.

## 2023-04-27 NOTE — Progress Notes (Signed)
Subjective:    Patient ID: Amber [name], female    DOB: [birth year], 52 y.o.   MRN: [id number]  HPI  Amber [name] is a very pleasant 52 y.o. female with history of recurrent CVA, type 2 diabetes, hypertension, hyperlipidemia, GAD, MDD who presents today for follow-up of diabetes, lab work, and for second Shingrix vaccine.  1) Type 2 Diabetes: Current medications include: None. Stopped Ozempic 1 month ago due to abdominal pain.   She is checking her blood glucose 0 times daily.  Last A1C: 5.7 in June 2024 Last Eye Exam: Up-to-date Last Foot Exam: Due Pneumonia Vaccination: 2016 Urine Microalbumin: Due Statin: Rosuvastatin  Dietary changes since last visit: Decreased appetite. Take out food. Reducing sodas, increased intake of water.   Exercise: Water   2) MDD/GAD: Chronic. She is also needing extension on her FMLA. She is currently working 6 hours daily, 5 days weekly. She feels well on this work schedule.  She continues to work with her psychiatrist and therapist regarding her ongoing depression and anxiety.  BP Readings from Last 3 Encounters:  04/27/23 122/86  04/07/23 122/84  03/22/23 120/84   Wt Readings from Last 3 Encounters:  04/27/23 163 lb (73.9 kg)  04/07/23 161 lb 8 oz (73.3 kg)  03/22/23 161 lb (73 kg)      Review of Systems  Eyes:  Negative for visual disturbance.  Respiratory:  Negative for shortness of breath.   Cardiovascular:  Negative for chest pain.  Neurological:  Negative for dizziness and numbness.         Past Medical History:  Diagnosis Date   Anxiety    self reported   Asthma    Cellulitis, toe 11/18/2021   Left great toe  No abscess    Depression    controlled   Diabetes mellitus without complication (HCC)    Hyperlipidemia    controlled with medication   IBS (irritable bowel syndrome)    Left shoulder pain 08/02/2018   Muscle cramps    Precordial pain 09/29/2014   Recurrent strokes (HCC)    Slurred speech    Stroke  (cerebrum) (HCC) 01/07/2021   Stroke (HCC) 03/13/2015   Tension headache 11/18/2020   Viral upper respiratory tract infection 05/07/2022   Word finding difficulty 02/27/2018    Social History   Socioeconomic History   Marital status: Single    Spouse name: Not on file   Number of children: Not on file   Years of education: Not on file   Highest education level: Not on file  Occupational History   Not on file  Tobacco Use   Smoking status: Never   Smokeless tobacco: Never  Vaping Use   Vaping status: Never Used  Substance and Sexual Activity   Alcohol use: Not Currently    Alcohol/week: 7.0 standard drinks of alcohol    Types: 7 Glasses of wine per week    Comment: red wine occasionally but not since stroke   Drug use: Not Currently    Types: Marijuana    Comment: edibles-twice weekly   Sexual activity: Not Currently  Other Topics Concern   Not on file  Social History Narrative   Originally from [place]   Family lives up here in [place]   Has one daughter.   Enjoys spending time shopping and spending time with family    Social Determinants of Health   Financial Resource Strain: Not on file  Food Insecurity: Not on file  Transportation Needs: Not on  file  Physical Activity: Not on file  Stress: Not on file  Social Connections: Not on file  Intimate Partner Violence: Not on file    Past Surgical History:  Procedure Laterality Date   ABDOMINAL HYSTERECTOMY  10/2006   bowel reconstruction  10/2006   with hysterectomy   CESAREAN SECTION  2005   EP IMPLANTABLE DEVICE N/A 03/17/2015   Procedure: Loop Recorder Insertion;  Surgeon: Amber Jorja Loa, MD;  Location: [facility];  Service: Cardiovascular;  Laterality: N/A;   TEE WITHOUT CARDIOVERSION N/A 03/17/2015   Procedure: TRANSESOPHAGEAL ECHOCARDIOGRAM (TEE);  Surgeon: Amber Si, MD;  Location: [facility] ENDOSCOPY;  Service: Cardiovascular;  Laterality: N/A;   TEE WITHOUT CARDIOVERSION N/A  01/06/2021   Procedure: TRANSESOPHAGEAL ECHOCARDIOGRAM (TEE);  Surgeon: Amber Kendall, MD;  Location: [facility] ORS;  Service: Cardiovascular;  Laterality: N/A;   TOE SURGERY     Left 2nd metatarsal    Family History  Problem Relation Age of Onset   Hyperlipidemia Mother    Heart failure Father    Arthritis Father    Diabetes Father    Hypertension Father    Hyperlipidemia Father    Prostate cancer Father    Heart attack Brother 10   Prostate cancer Maternal Uncle        x 3   Heart attack Maternal Grandfather    Hyperlipidemia Maternal Grandfather    Prostate cancer Maternal Grandfather    Diabetes Paternal Grandmother    Stroke Paternal Grandmother    Diabetes Paternal Grandfather    Heart attack Paternal Grandfather    Colon cancer Neg Hx    Colon polyps Neg Hx    Stomach cancer Neg Hx    Esophageal cancer Neg Hx    Pancreatic cancer Neg Hx     Allergies  Allergen Reactions   Cephalexin Hives and Rash    Can take Augmentin    Current Outpatient Medications on File Prior to Visit  Medication Sig Dispense Refill   albuterol (VENTOLIN HFA) 108 (90 Base) MCG/ACT inhaler Inhale 2 puffs into the lungs every 4 (four) hours as needed for wheezing or shortness of breath (cough, shortness of breath or wheezing.). 1 each 1   Ascorbic Acid (VITAMIN C) 1000 MG tablet Take 1 tablet (1,000 mg total) by mouth daily. 30 tablet 0   azelastine (ASTELIN) 0.1 % nasal spray Place 1 spray into both nostrils 2 (two) times daily. Use in each nostril as directed 30 mL 0   cholecalciferol (VITAMIN D3) 25 MCG (1000 UNIT) tablet Take 1 tablet (1,000 Units total) by mouth daily. 30 tablet 0   clopidogrel (PLAVIX) 75 MG tablet Take 1 tablet (75 mg total) by mouth daily. 90 tablet 3   cyclobenzaprine (FLEXERIL) 10 MG tablet Take 1 tablet (10 mg total) by mouth 3 (three) times daily as needed for muscle spasms. 30 tablet 0   ezetimibe (ZETIA) 10 MG tablet TAKE 1 TABLET BY MOUTH ONCE DAILY FOR CHOLESTEROL  90 tablet 1   Multiple Vitamins-Minerals (MULTIVITAMIN WITH MINERALS) tablet Take 1 tablet by mouth daily.     pantoprazole (PROTONIX) 20 MG tablet Take 1 tablet (20 mg total) by mouth 2 (two) times daily before a meal. for heartburn. 180 tablet 0   rosuvastatin (CRESTOR) 20 MG tablet Take 1 tablet (20 mg total) by mouth daily. for cholesterol. 90 tablet 0   sertraline (ZOLOFT) 100 MG tablet Take by mouth.     topiramate (TOPAMAX) 25 MG tablet TAKE 1 TABLET BY  MOUTH ONCE DAILY FOR  HEADACHE  PREVENTION 90 tablet 3   No current facility-administered medications on file prior to visit.    BP 122/86   Pulse 76   Temp (!) 97 F (36.1 C) (Temporal)   Ht 5\' 2"  (1.575 m)   Wt 163 lb (73.9 kg)   SpO2 98%   BMI 29.81 kg/m  Objective:   Physical Exam Cardiovascular:     Rate and Rhythm: Normal rate and regular rhythm.  Pulmonary:     Effort: Pulmonary effort is normal.     Breath sounds: Normal breath sounds.  Musculoskeletal:     Cervical back: Neck supple.  Skin:    General: Skin is warm and dry.  Neurological:     Mental Status: She is alert and oriented to person, place, and time.  Psychiatric:        Mood and Affect: Mood normal.           Assessment & Plan:  Controlled type 2 diabetes mellitus with hyperglycemia, without long-term current use of insulin (HCC) Assessment & Plan: Slightly deteriorated but still under control with A1c of 6.0 today. Remain off Ozempic due to side effects.  Start Mounjaro 2.5 mg weekly x 4 weeks, then increase to 5 mg weekly thereafter.  Foot exam today. Urine microalbumin due and pending.  Follow up in 6 months.  Orders: -     POCT glycosylated hemoglobin (Hb A1C) -     CBC -     Hepatic function panel -     Microalbumin / creatinine urine ratio -     Tirzepatide; Inject 2.5 mg into the skin once a week.  Dispense: 2 mL; Refill: 0  Hyperlipidemia associated with type 2 diabetes mellitus (HCC) -     Lipid panel -     Hepatic  function panel  GAD (generalized anxiety disorder) Assessment & Plan: Agree to extend FMLA hours. She is working 6 hours/day, 5 days/week.  Work Physicist, medical provided.   Moderate episode of recurrent major depressive disorder [facility]) Assessment & Plan: Agree to extend FMLA hours. She is working 6 hours/day, 5 days/week.  Work Physicist, medical provided.         Doreene Nest, NP

## 2023-04-30 DIAGNOSIS — R7989 Other specified abnormal findings of blood chemistry: Secondary | ICD-10-CM

## 2023-05-04 DIAGNOSIS — F330 Major depressive disorder, recurrent, mild: Secondary | ICD-10-CM | POA: Diagnosis not present

## 2023-05-06 ENCOUNTER — Inpatient Hospital Stay: Payer: BC Managed Care – PPO | Attending: Internal Medicine | Admitting: Internal Medicine

## 2023-05-06 ENCOUNTER — Other Ambulatory Visit: Payer: BC Managed Care – PPO

## 2023-05-06 ENCOUNTER — Encounter: Payer: Self-pay | Admitting: Internal Medicine

## 2023-05-06 VITALS — BP 149/96 | HR 76 | Temp 97.6°F | Ht 62.0 in | Wt 165.2 lb

## 2023-05-06 DIAGNOSIS — Z7902 Long term (current) use of antithrombotics/antiplatelets: Secondary | ICD-10-CM | POA: Insufficient documentation

## 2023-05-06 DIAGNOSIS — D72819 Decreased white blood cell count, unspecified: Secondary | ICD-10-CM | POA: Diagnosis not present

## 2023-05-06 DIAGNOSIS — Z8673 Personal history of transient ischemic attack (TIA), and cerebral infarction without residual deficits: Secondary | ICD-10-CM | POA: Diagnosis not present

## 2023-05-06 DIAGNOSIS — Z7985 Long-term (current) use of injectable non-insulin antidiabetic drugs: Secondary | ICD-10-CM | POA: Insufficient documentation

## 2023-05-06 DIAGNOSIS — Z79899 Other long term (current) drug therapy: Secondary | ICD-10-CM | POA: Diagnosis not present

## 2023-05-06 NOTE — Progress Notes (Signed)
[facility] CONSULT NOTE  Patient Care Team: Doreene Nest, NP as PCP - General (Internal Medicine) End, Cristal Deer, MD as PCP - Cardiology (Cardiology) Earna Coder, MD as Consulting Physician (Oncology)  # CHIEF COMPLAINTS/PURPOSE OF CONSULTATION: Leucopenia  # LEUCOPENIA/NEUTROPENIA- ANC; Hb; platelets; CT Ab/US; Hepatitis/HIV; Alcohol  #  Oncology History   No history exists.     HISTORY OF PRESENTING ILLNESS: Patient ambulating-independently.   Alone; daughter on the phone.    Amber [name] 52 y.o.  female with a history stroke of unclear etiology [2016-first] is here for further evaluation leucopenia.   Patient noted to have low white count incidentally on blood work.  Patient denies any unusual symptoms of the time of the blood test.   Frequent infections [pneumonias/sinus infection/UTI]- none  Early satiety: None Lymph node enlargement: None  Weight loss: none  Skin rash: none Autoimmune: none [rheumatoid: Thyroid: Lupus:] Gastric bypass: none  Medications: antiepileptics- Topomax [since 2020] Alcohol:  None  Family History:  none.   Review of Systems  Constitutional:  Negative for chills, diaphoresis, fever, malaise/fatigue and weight loss.  HENT:  Negative for nosebleeds and sore throat.   Eyes:  Negative for double vision.  Respiratory:  Negative for cough, hemoptysis, sputum production, shortness of breath and wheezing.   Cardiovascular:  Negative for chest pain, palpitations, orthopnea and leg swelling.  Gastrointestinal:  Negative for abdominal pain, blood in stool, constipation, diarrhea, heartburn, melena, nausea and vomiting.  Genitourinary:  Negative for dysuria, frequency and urgency.  Musculoskeletal:  Negative for back pain and joint pain.  Skin: Negative.  Negative for itching and rash.  Neurological:  Negative for dizziness, tingling, focal weakness, weakness and headaches.  Endo/Heme/Allergies:  Does not  bruise/bleed easily.  Psychiatric/Behavioral:  Negative for depression. The patient is not nervous/anxious and does not have insomnia.      MEDICAL HISTORY:  Past Medical History:  Diagnosis Date   Anxiety    self reported   Asthma    Cellulitis, toe 11/18/2021   Left great toe  No abscess    Depression    controlled   Diabetes mellitus without complication (HCC)    Hyperlipidemia    controlled with medication   IBS (irritable bowel syndrome)    Left shoulder pain 08/02/2018   Muscle cramps    Precordial pain 09/29/2014   Recurrent strokes (HCC)    Slurred speech    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   Stroke (HCC) 03/13/2015   Tension headache 11/18/2020   Viral upper respiratory tract infection 05/07/2022   Word finding difficulty 02/27/2018    SURGICAL HISTORY: Past Surgical History:  Procedure Laterality Date   ABDOMINAL HYSTERECTOMY  10/2006   bowel reconstruction  10/2006   with hysterectomy   CESAREAN SECTION  2005   EP IMPLANTABLE DEVICE N/A 03/17/2015   Procedure: Loop Recorder Insertion;  Surgeon: Will Jorja Loa, MD;  Location: [facility] INVASIVE CV LAB;  Service: Cardiovascular;  Laterality: N/A;   TEE WITHOUT CARDIOVERSION N/A 03/17/2015   Procedure: TRANSESOPHAGEAL ECHOCARDIOGRAM (TEE);  Surgeon: Chilton Si, MD;  Location: [facility] ENDOSCOPY;  Service: Cardiovascular;  Laterality: N/A;   TEE WITHOUT CARDIOVERSION N/A 01/06/2021   Procedure: TRANSESOPHAGEAL ECHOCARDIOGRAM (TEE);  Surgeon: [name], MD;  Location: [facility] ORS;  Service: Cardiovascular;  Laterality: N/A;   TOE SURGERY     Left 2nd metatarsal    SOCIAL HISTORY: Social History   Socioeconomic History   Marital status: Single    Spouse name: Not on file  Number of children: Not on file   Years of education: Not on file   Highest education level: Not on file  Occupational History   Not on file  Tobacco Use   Smoking status: Never   Smokeless tobacco: Never  Vaping Use   Vaping  status: Never Used  Substance and Sexual Activity   Alcohol use: Not Currently    Alcohol/week: 7.0 standard drinks of alcohol    Types: 7 Glasses of wine per week    Comment: red wine occasionally but not since stroke   Drug use: Not Currently    Types: Marijuana    Comment: edibles-twice weekly   Sexual activity: Not Currently  Other Topics Concern   Not on file  Social History Narrative   Originally from [place]   Family lives up here in [place]   Has one daughter.   Enjoys spending time shopping and spending time with family    Social Determinants of Health   Financial Resource Strain: Not on file  Food Insecurity: No Food Insecurity (05/06/2023)   Hunger Vital Sign    Worried About Running Out of Food in the Last Year: Never true    Ran Out of Food in the Last Year: Never true  Transportation Needs: No Transportation Needs (05/06/2023)   PRAPARE - Administrator, Civil Service (Medical): No    Lack of Transportation (Non-Medical): No  Physical Activity: Not on file  Stress: Not on file  Social Connections: Not on file  Intimate Partner Violence: Not At Risk (05/06/2023)   Humiliation, Afraid, Rape, and Kick questionnaire    Fear of Current or Ex-Partner: No    Emotionally Abused: No    Physically Abused: No    Sexually Abused: No    FAMILY HISTORY: Family History  Problem Relation Age of Onset   Hyperlipidemia Mother    Heart failure Father    Arthritis Father    Diabetes Father    Hypertension Father    Hyperlipidemia Father    Prostate cancer Father    Stroke Father    Heart attack Brother 20   Prostate cancer Maternal Uncle        x 3   Heart attack Maternal Grandfather    Hyperlipidemia Maternal Grandfather    Prostate cancer Maternal Grandfather    Diabetes Paternal Grandmother    Stroke Paternal Grandmother    Diabetes Paternal Grandfather    Heart attack Paternal Grandfather    Colon cancer Neg Hx    Colon polyps Neg Hx     Stomach cancer Neg Hx    Esophageal cancer Neg Hx    Pancreatic cancer Neg Hx     ALLERGIES:  is allergic to cephalexin.  MEDICATIONS:  Current Outpatient Medications  Medication Sig Dispense Refill   albuterol (VENTOLIN HFA) 108 (90 Base) MCG/ACT inhaler Inhale 2 puffs into the lungs every 4 (four) hours as needed for wheezing or shortness of breath (cough, shortness of breath or wheezing.). 1 each 1   Ascorbic Acid (VITAMIN C) 1000 MG tablet Take 1 tablet (1,000 mg total) by mouth daily. 30 tablet 0   azelastine (ASTELIN) 0.1 % nasal spray Place 1 spray into both nostrils 2 (two) times daily. Use in each nostril as directed 30 mL 0   cholecalciferol (VITAMIN D3) 25 MCG (1000 UNIT) tablet Take 1 tablet (1,000 Units total) by mouth daily. 30 tablet 0   clopidogrel (PLAVIX) 75 MG tablet Take 1 tablet (75 mg  total) by mouth daily. 90 tablet 3   cyclobenzaprine (FLEXERIL) 10 MG tablet Take 1 tablet (10 mg total) by mouth 3 (three) times daily as needed for muscle spasms. 30 tablet 0   ezetimibe (ZETIA) 10 MG tablet TAKE 1 TABLET BY MOUTH ONCE DAILY FOR CHOLESTEROL 90 tablet 1   Multiple Vitamins-Minerals (MULTIVITAMIN WITH MINERALS) tablet Take 1 tablet by mouth daily.     pantoprazole (PROTONIX) 20 MG tablet Take 1 tablet (20 mg total) by mouth 2 (two) times daily before a meal. for heartburn. 180 tablet 0   rosuvastatin (CRESTOR) 20 MG tablet Take 1 tablet (20 mg total) by mouth daily. for cholesterol. 90 tablet 0   sertraline (ZOLOFT) 100 MG tablet Take by mouth.     tirzepatide (MOUNJARO) 2.5 MG/0.5ML Pen Inject 2.5 mg into the skin once a week. 2 mL 0   topiramate (TOPAMAX) 25 MG tablet TAKE 1 TABLET BY MOUTH ONCE DAILY FOR  HEADACHE  PREVENTION 90 tablet 3   No current facility-administered medications for this visit.      PHYSICAL EXAMINATION:  Vitals:   05/06/23 1430 05/06/23 1506  BP: (!) 134/101 (!) 149/96  Pulse:    Temp:    SpO2:     Filed Weights   05/06/23 1415   Weight: 165 lb 3.2 oz (74.9 kg)    Physical Exam Vitals and nursing note reviewed.  HENT:     Head: Normocephalic and atraumatic.     Mouth/Throat:     Pharynx: Oropharynx is clear.  Eyes:     Extraocular Movements: Extraocular movements intact.     Pupils: Pupils are equal, round, and reactive to light.  Cardiovascular:     Rate and Rhythm: Normal rate and regular rhythm.  Pulmonary:     Comments: Decreased breath sounds bilaterally.  Abdominal:     Palpations: Abdomen is soft.  Musculoskeletal:        General: Normal range of motion.     Cervical back: Normal range of motion.  Skin:    General: Skin is warm.  Neurological:     General: No focal deficit present.     Mental Status: She is alert and oriented to person, place, and time.  Psychiatric:        Behavior: Behavior normal.        Judgment: Judgment normal.      LABORATORY DATA:  I have reviewed the data as listed Lab Results  Component Value Date   WBC 3.2 (L) 04/27/2023   HGB 14.0 04/27/2023   HCT 43.2 04/27/2023   MCV 89.0 04/27/2023   PLT 217.0 04/27/2023   Recent Labs    06/14/22 1512 11/11/22 0816 01/26/23 1940 02/01/23 1220 04/07/23 0917 04/27/23 0831  NA  --    < > 139 142 140  --   K  --    < > 3.3* 3.8 4.0  --   CL  --    < > 105 105 106  --   CO2  --    < > 27 29 28   --   GLUCOSE  --    < > 98 81 94  --   BUN  --    < > 14 13 13   --   CREATININE  --    < > 1.00 1.14 1.05  --   CALCIUM  --    < > 9.7 9.8 9.9  --   GFRNONAA  --   --  >60  --   --   --  PROT 7.8  --  8.4*  --   --  6.6  ALBUMIN 3.9  --  4.5  --   --  4.1  AST 23  --  24  --   --  23  ALT 34  --  36  --   --  32  ALKPHOS 59  --  62  --   --  70  BILITOT 0.7  --  0.7  --   --  0.5  BILIDIR 0.1  --   --   --   --  0.1  IBILI 0.6  --   --   --   --   --    < > = values in this interval not displayed.    RADIOGRAPHIC STUDIES: I have personally reviewed the radiological images as listed and agreed with the findings  in the report. CT ABDOMEN PELVIS W CONTRAST  Result Date: 04/08/2023 CLINICAL DATA:  LLQ abdominal pain RLQ abdominal pain Unclear etiology. She does have moderate tender tenderness to bilateral lower abdominal regions. Reviewed colonoscopy from 2022 which does not show diverticulosis. Urinalysis today negative. EXAM: CT ABDOMEN AND PELVIS WITH CONTRAST TECHNIQUE: Multidetector CT imaging of the abdomen and pelvis was performed using the standard protocol following bolus administration of intravenous contrast. RADIATION DOSE REDUCTION: This exam was performed according to the departmental dose-optimization program which includes automated exposure control, adjustment of the mA and/or kV according to patient size and/or use of iterative reconstruction technique. CONTRAST:  OMNIPAQUE IOHEXOL 300 MG/ML  SOLN COMPARISON:  None Available. FINDINGS: Lower chest: No acute abnormality. Hepatobiliary: Pericentimeter fluid density lesion within the liver likely a simple hepatic cyst (2:13). Subcentimeter hypodensity too small to characterize (2:6). No gallstones, gallbladder wall thickening, or pericholecystic fluid. No biliary dilatation. Pancreas: No focal lesion. Normal pancreatic contour. No surrounding inflammatory changes. No main pancreatic ductal dilatation. Spleen: Normal in size without focal abnormality. Adrenals/Urinary Tract: No adrenal nodule bilaterally. Bilateral kidneys enhance symmetrically. No hydronephrosis. No hydroureter.  No nephroureterolithiasis The urinary bladder is unremarkable. Stomach/Bowel: Stomach is within normal limits. No evidence of bowel wall thickening or dilatation. Appendix appears normal. Vascular/Lymphatic: No abdominal aorta or iliac aneurysm. No abdominal, pelvic, or inguinal lymphadenopathy. Reproductive: Status post hysterectomy. No adnexal masses. Other: No intraperitoneal free fluid. No intraperitoneal free gas. No organized fluid collection. Musculoskeletal: No  abdominal wall hernia or abnormality. No suspicious lytic or blastic osseous lesions. No acute displaced fracture. IMPRESSION: No acute intra-abdominal or intrapelvic abnormality. Electronically Signed   By: Tish Frederickson M.D.   On: 04/08/2023 08:00    ASSESSMENT & PLAN:   Leucopenia # Leukopenia- no differential;  normal hemoglobin/platelets. Patient is asymptomatic-no increased risk of infections. Suspect benign causes rather than any malignant causes.   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 NOV 2024- CT AP- No evidence of hepatosplenomegaly or liver disease.  # I discussed the possibility/need for a bone marrow biopsy if significant neutropenia is continued. However we will plan to hold bone marrow biopsy for now.  Await repeat blood work again in 3 months.  #  Stroke- multiple of unclear etiology [2016-first] - on plavix/ Topomax [Dr.Sethi; [facility]]  # Obesity: on Mujauro.   Thank you, Ms.Clark for allowing me to participate in the care of your pleasant patient. Please do not hesitate to contact me with questions or concerns in the interim.  # DISPOSITION: #  NO labs today # follow up in 3 months-  MD;  labs-CBC CMP LDH; HIV,HBsAg; copper, zinc -Dr.B    All questions were answered. The patient knows to call the clinic with any problems, questions or concerns.      Earna Coder, MD 05/06/2023 3:17 PM

## 2023-05-06 NOTE — Progress Notes (Signed)
C/o sinus pain and abdominal pain(thinks abd pain is from scar tissue), 5/10.

## 2023-05-06 NOTE — Assessment & Plan Note (Addendum)
#   Leukopenia- no differential; 3.2  normal hemoglobin/platelets. Patient is asymptomatic-no increased risk of infections. Suspect benign causes rather than any malignant causes.   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 NOV 2024- CT AP- No evidence of hepatosplenomegaly or liver disease.  # I discussed the possibility/need for a bone marrow biopsy if significant neutropenia is continued. However we will plan to hold bone marrow biopsy for now.  Await repeat blood work again in 3 months.  #  Stroke- multiple of unclear etiology [2016-first] - on plavix/ Topomax [Dr.Sethi; [facility]]  # Obesity: on Mujauro.   Thank you, Ms.Clark for allowing me to participate in the care of your pleasant patient. Please do not hesitate to contact me with questions or concerns in the interim.  # DISPOSITION: # NO labs today # follow up in 3 months-  MD;  labs-CBC CMP LDH; HIV,HBsAg; copper, zinc -Dr.B

## 2023-05-12 ENCOUNTER — Other Ambulatory Visit (HOSPITAL_COMMUNITY): Payer: Self-pay

## 2023-05-13 ENCOUNTER — Ambulatory Visit: Payer: BC Managed Care – PPO | Admitting: Primary Care

## 2023-05-13 ENCOUNTER — Telehealth: Payer: Self-pay

## 2023-05-13 ENCOUNTER — Other Ambulatory Visit (HOSPITAL_COMMUNITY): Payer: Self-pay

## 2023-05-13 NOTE — Telephone Encounter (Signed)
*  Primary  Pharmacy Patient Advocate Encounter   Received notification from CoverMyMeds that prior authorization for [facility] 2.5MG /0.5ML auto-injectors  is required/requested.   Insurance verification completed.   The patient is insured through [employer] .   Per test claim: PA required; PA submitted to above mentioned insurance via CoverMyMeds Key/confirmation #/EOC [id number] Status is pending

## 2023-05-13 NOTE — Telephone Encounter (Signed)
Pharmacy Patient Advocate Encounter  Received notification from [facility] that Prior Authorization for [facility] 2.5MG /0.5ML auto-injectors has been APPROVED from 05/13/2023 to 05/12/2024. Ran test claim, Copay is $4.00. This test claim was processed through [facility]- copay amounts may vary at other pharmacies due to pharmacy/plan contracts, or as the patient moves through the different stages of their insurance plan.   PA #/Case ID/Reference #: [id number]

## 2023-05-20 DIAGNOSIS — J209 Acute bronchitis, unspecified: Secondary | ICD-10-CM | POA: Diagnosis not present

## 2023-05-20 DIAGNOSIS — R059 Cough, unspecified: Secondary | ICD-10-CM | POA: Diagnosis not present

## 2023-05-22 NOTE — Progress Notes (Deleted)
    Amber [name] T. Cherry Turlington, MD, CAQ Sports Medicine [facility] at [facility] [address] [place], [address]  Phone: [phone]  FAX: [phone]  Amber [name] - 52 y.o. female  MRN [id number]  Date of Birth: May 01, 1971  Date: 05/23/2023  PCP: Doreene Nest, NP  Referral: Doreene Nest, NP  No chief complaint on file.  Subjective:   Amber [name] is a 52 y.o. very pleasant female patient with There is no height or weight on file to calculate BMI. who presents with the following:  The patient presents with cough, sore throat, and headache.    Review of Systems is noted in the HPI, as appropriate  Objective:   There were no vitals taken for this visit.  GEN: No acute distress; alert,appropriate. PULM: Breathing comfortably in no respiratory distress PSYCH: Normally interactive.   Laboratory and Imaging Data:  Assessment and Plan:   ***

## 2023-05-23 ENCOUNTER — Ambulatory Visit: Payer: BC Managed Care – PPO | Admitting: Family Medicine

## 2023-06-07 ENCOUNTER — Encounter: Payer: Self-pay | Admitting: Cardiology

## 2023-06-07 ENCOUNTER — Ambulatory Visit: Payer: BC Managed Care – PPO | Attending: Cardiology | Admitting: Cardiology

## 2023-06-07 VITALS — BP 112/83 | HR 89 | Ht 62.0 in | Wt 164.0 lb

## 2023-06-07 DIAGNOSIS — I491 Atrial premature depolarization: Secondary | ICD-10-CM | POA: Diagnosis not present

## 2023-06-07 DIAGNOSIS — R002 Palpitations: Secondary | ICD-10-CM | POA: Diagnosis not present

## 2023-06-07 DIAGNOSIS — I639 Cerebral infarction, unspecified: Secondary | ICD-10-CM | POA: Diagnosis not present

## 2023-06-07 DIAGNOSIS — F330 Major depressive disorder, recurrent, mild: Secondary | ICD-10-CM | POA: Diagnosis not present

## 2023-06-07 NOTE — Patient Instructions (Signed)
 Medication Instructions:  Your physician recommends that you continue on your current medications as directed. Please refer to the Current Medication list given to you today.  *If you need a refill on your cardiac medications before your next appointment, please call your pharmacy*  Testing/Procedures: You will have your loop recorder explanted and a new loop recorder implanted at your next office visit. There are no restrictions or special instructions prior to this appointment.  Follow-Up: At [facility], you and your health needs are our priority.  As part of our continuing mission to provide you with exceptional heart care, we have created designated Provider Care Teams.  These Care Teams include your primary Cardiologist (physician) and Advanced Practice Providers (APPs -  Physician Assistants and Nurse Practitioners) who all work together to provide you with the care you need, when you need it.  Your next appointment:     Provider:   Dr. Kennyth

## 2023-06-07 NOTE — Progress Notes (Signed)
 Electrophysiology Office Note:   Date:  06/07/2023  ID:  Amber [name], DOB December 08, 1970, MRN [id number]  Primary Cardiologist: Lonni Hanson, MD Primary Heart Failure: None Electrophysiologist: [name] Kitty, MD      History of Present Illness:   Amber [name] is a 53 y.o. female with h/o multiple strokes, diabetes type 2, hyperlipidemia, COVID in 2022, asthma, IBS, anxiety who is being seen today for evaluation of her ILR.   Patient had first stroke at age 34 with left MCA infarct with an embolic pattern secondary to unknown source. Loop recorder was implanted in 2016. Loop recorder remained in place until 2020 with no atrial fibrillation, then reached battery depletion. There were initial plans to explant her old ILR and insert a new one for continued monitoring; however, this was not done. She did well until 2022 when she had another stroke. She recently reestablished care with general cardiology and was referred to EP for evaluation of her loop recorder. Today, she reports feeling relatively well. She denies any new or acute complaints. She has no known history of atrial fibrillation.   Review of systems complete and found to be negative unless listed in HPI.   EP Information / Studies Reviewed:    EKG is ordered today. Personal review as below.  EKG Interpretation Date/Time:  Tuesday June 07 2023 15:51:22 EST Ventricular Rate:  89 PR Interval:  126 QRS Duration:  74 QT Interval:  350 QTC Calculation: 425 R Axis:   -3  Text Interpretation: Normal sinus rhythm Nonspecific T wave abnormality When compared with ECG of 26-Jan-2023 21:18,no significant change. Voltages better in precordial leads today. Confirmed by Amber [name] [phone]) on 06/07/2023 4:54:36 PM   Zio Monitor 09/2021: The patient was monitored for 13 days, 9 hours. The predominant rhythm was sinus with an average rate of 90 bpm (range 51-184 bpm in sinus). There were rare PACs and PVCs. Single atrial run lasting 5  beats occurred with a maximum rate of 193 bpm. No sustained arrhythmia or prolonged pause was observed. Patient triggered event corresponds to sinus rhythm with artifact and PVC.   Predominantly sinus rhythm with rare PACs and PVCs as well as single brief episode of PSVT.  No atrial fibrillation/flutter observed.  Zio Monitor 07/2021: The patient was monitored for 14 days. The predominant rhythm was sinus with an average rate of 87 bpm (range 52-161 bpm). There were rare PACs and PVCs. No sustained arrhythmia or prolonged pause was identified. Single patient triggered event corresponds to normal sinus rhythm.   Predominantly sinus rhythm with rare PACs and PVCs.  No significant arrhythmia identified.      CTA Coronary 01/2021: IMPRESSION: 1. Normal coronary calcium  score of 0. Patient is low risk for coronary events.   2. Normal coronary origin with right dominance.   3. No evidence of CAD.   4. CAD-RADS 0. Consider non-atherosclerotic causes of chest pain.  Echo 01/04/21:  1. Left ventricular ejection fraction, by estimation, is 55 to 60%. The  left ventricle has normal function. The left ventricle has no regional  wall motion abnormalities. Left ventricular diastolic parameters are  consistent with Grade I diastolic  dysfunction (impaired relaxation).   2. Right ventricular systolic function is normal. The right ventricular  size is normal.   3. The mitral valve is degenerative. Mild mitral valve regurgitation.   4. The aortic valve is tricuspid. Aortic valve regurgitation is not  visualized.   5. Agitated saline contrast bubble study was negative, with no evidence  of any interatrial shunt.     Physical Exam:   VS:  BP 112/83 (BP Location: Left Arm, Patient Position: Sitting, Cuff Size: Normal)   Pulse 89   Ht 5' 2 (1.575 m)   Wt 164 lb (74.4 kg)   SpO2 96%   BMI 30.00 kg/m    Wt Readings from Last 3 Encounters:  06/07/23 164 lb (74.4 kg)  05/06/23 165 lb 3.2 oz  (74.9 kg)  04/27/23 163 lb (73.9 kg)     GEN: Well nourished, well developed in no acute distress NECK: No JVD CARDIAC: Normal rate and regular rhythm. RESPIRATORY:  Clear to auscultation without rales, wheezing or rhonchi  ABDOMEN: Soft, non-tender, non-distended EXTREMITIES:  No edema; No deformity   ASSESSMENT AND PLAN:   Amber [name] is a 53 y.o. female with h/o multiple strokes, diabetes type 2, hyperlipidemia, COVID in 2022, asthma, IBS, anxiety who is being seen today for evaluation of her ILR.   #Cryptogenic stroke #PACs #Palpitations - She has a loop recorder which has reached end of life. She had another cryptogenic stroke after loop recorder reached end of life. I think it would be reasonable given presence of PACs and recurrent nature of her cryptogenic strokes to explant her old device and implant a new loop recorder for continued AF monitoring.   Follow up with Dr. Kennyth  for loop recorder explant/implant.   Signed, [name] Kennyth, MD

## 2023-06-17 ENCOUNTER — Other Ambulatory Visit: Payer: Self-pay | Admitting: Primary Care

## 2023-06-17 DIAGNOSIS — E1169 Type 2 diabetes mellitus with other specified complication: Secondary | ICD-10-CM

## 2023-06-23 ENCOUNTER — Ambulatory Visit: Payer: BC Managed Care – PPO | Admitting: Internal Medicine

## 2023-06-27 ENCOUNTER — Telehealth: Payer: Self-pay | Admitting: Cardiology

## 2023-06-27 NOTE — Telephone Encounter (Signed)
Spoke with the patient and advised that she will not be put to sleep for her loop recorder explant and implant

## 2023-06-27 NOTE — Telephone Encounter (Signed)
Pt is having Loop Recorder replaced tomorrow and wants to know if she will be put to sleep. Please advise

## 2023-06-28 ENCOUNTER — Encounter: Payer: Self-pay | Admitting: Cardiology

## 2023-06-28 ENCOUNTER — Ambulatory Visit: Payer: BC Managed Care – PPO | Attending: Cardiology | Admitting: Cardiology

## 2023-06-28 VITALS — BP 114/72 | HR 85 | Ht 62.5 in | Wt 162.0 lb

## 2023-06-28 DIAGNOSIS — R002 Palpitations: Secondary | ICD-10-CM | POA: Diagnosis not present

## 2023-06-28 DIAGNOSIS — I639 Cerebral infarction, unspecified: Secondary | ICD-10-CM

## 2023-06-28 NOTE — Patient Instructions (Addendum)
Medication Instructions:  Your physician recommends that you continue on your current medications as directed. Please refer to the Current Medication list given to you today.  Labwork: None ordered.  Testing/Procedures: None ordered.  Follow-Up:  As needed with Dr. Jimmey Ralph  Implantable Loop Recorder Placement, Care After This sheet gives you information about how to care for yourself after your procedure. Your health care provider may also give you more specific instructions. If you have problems or questions, contact your health care provider. What can I expect after the procedure? After the procedure, it is common to have: Soreness or discomfort near the incision. Some swelling or bruising near the incision.  Follow these instructions at home: Incision care  Monitor your cardiac device site for redness, swelling, and drainage. Call the device clinic at [phone] if you experience these symptoms or fever/chills.  Keep the large square bandage on your site for 24 hours and then you may remove it yourself. Keep the steri-strips underneath in place.   You may shower after 72 hours / 3 days from your procedure with the steri-strips in place. They will usually fall off on their own, or may be removed after 10 days. Pat dry.   Avoid lotions, ointments, or perfumes over your incision until it is well-healed.  Please do not submerge in water until your site is completely healed.   Your device is MRI compatible.   Remote monitoring is used to monitor your cardiac device from home. This monitoring is scheduled every month by our office. It allows [place] to keep an eye on the function of your device to ensure it is working properly.  If your wound site starts to bleed apply pressure.    For help with the monitor please call [employer] Monitor Support Specialist directly at [phone].    If you have any questions/concerns please call the device clinic at  [phone].  Activity  Return to your normal activities.  General instructions Follow instructions from your health care provider about how to manage your implantable loop recorder and transmit the information. Learn how to activate a recording if this is necessary for your type of device. You may go through a metal detection gate, and you may let someone hold a metal detector over your chest. Show your ID card if needed. Do not have an MRI unless you check with your health care provider first. Take over-the-counter and prescription medicines only as told by your health care provider. Keep all follow-up visits as told by your health care provider. This is important. Contact a health care provider if: You have redness, swelling, or pain around your incision. You have a fever. You have pain that is not relieved by your pain medicine. You have triggered your device because of fainting (syncope) or because of a heartbeat that feels like it is racing, slow, fluttering, or skipping (palpitations). Get help right away if you have: Chest pain. Difficulty breathing. Summary After the procedure, it is common to have soreness or discomfort near the incision. Change your dressing as told by your health care provider. Follow instructions from your health care provider about how to manage your implantable loop recorder and transmit the information. Keep all follow-up visits as told by your health care provider. This is important. This information is not intended to replace advice given to you by your health care provider. Make sure you discuss any questions you have with your health care provider. Document Released: 04/28/2015 Document Revised: 07/02/2017 Document Reviewed: 07/02/2017 Elsevier Patient  Education  [employer].

## 2023-06-28 NOTE — Progress Notes (Signed)
Electrophysiology Office Note:   Date:  06/28/2023  ID:  Amber [name], DOB 1970-08-15, MRN [id number]  Primary Cardiologist: [name], MD Primary Heart Failure: None Electrophysiologist: Nobie Putnam, MD      History of Present Illness:   Amber [name] is a 53 y.o. female with h/o multiple strokes, diabetes type 2, hyperlipidemia, COVID in 2022, asthma, IBS, anxiety who is being seen today for evaluation of her ILR. Patient had first stroke at age 61 with left MCA infarct with an embolic pattern secondary to unknown source. Loop recorder was implanted in 2016. Loop recorder remained in place until 2020 with no atrial fibrillation, then reached battery depletion. There were initial plans to explant her old ILR and insert a new one for continued monitoring; however, this was not done. She did well until 2022 when she had another stroke.   Interval History: Today, she presents for planned loop extraction and reimplant. She reports feeling relatively well since her last clinic visit. She denies any new or acute complaints.   Review of systems complete and found to be negative unless listed in HPI.   EP Information / Studies Reviewed:    EKG is ordered today. Personal review as below.  EKG Interpretation Date/Time:  Tuesday June 28 2023 16:18:19 EST Ventricular Rate:  85 PR Interval:  138 QRS Duration:  82 QT Interval:  344 QTC Calculation: 409 R Axis:   -14  Text Interpretation: Normal sinus rhythm with sinus arrhythmia When compared with ECG of 07-Jun-2023 15:51, No significant change was found Confirmed by Nobie Putnam [id number]) on 06/28/2023 9:13:21 PM   Zio Monitor 09/2021: The patient was monitored for 13 days, 9 hours. The predominant rhythm was sinus with an average rate of 90 bpm (range 51-184 bpm in sinus). There were rare PACs and PVCs. Single atrial run lasting 5 beats occurred with a maximum rate of 193 bpm. No sustained arrhythmia or prolonged pause was  observed. Patient triggered event corresponds to sinus rhythm with artifact and PVC.   Predominantly sinus rhythm with rare PACs and PVCs as well as single brief episode of PSVT.  No atrial fibrillation/flutter observed.  Zio Monitor 07/2021: The patient was monitored for 14 days. The predominant rhythm was sinus with an average rate of 87 bpm (range 52-161 bpm). There were rare PACs and PVCs. No sustained arrhythmia or prolonged pause was identified. Single patient triggered event corresponds to normal sinus rhythm.   Predominantly sinus rhythm with rare PACs and PVCs.  No significant arrhythmia identified.      CTA Coronary 01/2021: IMPRESSION: 1. Normal coronary calcium score of 0. Patient is low risk for coronary events.   2. Normal coronary origin with right dominance.   3. No evidence of CAD.   4. CAD-RADS 0. Consider non-atherosclerotic causes of chest pain.  Echo 01/04/21:  1. Left ventricular ejection fraction, by estimation, is 55 to 60%. The  left ventricle has normal function. The left ventricle has no regional  wall motion abnormalities. Left ventricular diastolic parameters are  consistent with Grade I diastolic  dysfunction (impaired relaxation).   2. Right ventricular systolic function is normal. The right ventricular  size is normal.   3. The mitral valve is degenerative. Mild mitral valve regurgitation.   4. The aortic valve is tricuspid. Aortic valve regurgitation is not  visualized.   5. Agitated saline contrast bubble study was negative, with no evidence  of any interatrial shunt.     Physical Exam:   VS:  BP  114/72   Pulse 85   Ht 5' 2.5" (1.588 m)   Wt 162 lb (73.5 kg)   SpO2 97%   BMI 29.16 kg/m    Wt Readings from Last 3 Encounters:  06/28/23 162 lb (73.5 kg)  06/07/23 164 lb (74.4 kg)  05/06/23 165 lb 3.2 oz (74.9 kg)     GEN: Well nourished, well developed in no acute distress NECK: No JVD CARDIAC: Normal rate and regular  rhythm. RESPIRATORY:  Clear to auscultation without rales, wheezing or rhonchi  ABDOMEN: Soft, non-distended EXTREMITIES:  No edema; No deformity   ASSESSMENT AND PLAN:   Amber [name] is a 53 y.o. female with h/o multiple strokes, diabetes type 2, hyperlipidemia, COVID in 2022, asthma, IBS, anxiety who is being seen today for evaluation of her ILR.   #Cryptogenic stroke #PACs #Palpitations - She has a loop recorder which has reached end of life. She had another cryptogenic stroke after loop recorder reached end of life. We discussed all of her options at her prior visit, including abandoning her existing loop recorder, extracting the loop recorder at end of life, extracting the old loop and reimplanting a new device. Given that she has had another stroke after the last loop recorder battery died, I think it would be reasonable to continue monitoring. Explained risks, benefits, and alternatives to ILR explant and reimplantation, including but not limited to bleeding, infection, damage to heart or lungs.  Pt verbalized understanding and agrees to proceed today.   SURGEON:  Nobie Putnam, MD     PREPROCEDURE DIAGNOSIS:  Cryptogenic stroke    POSTPROCEDURE DIAGNOSIS: Cryptogenic stroke     PROCEDURES:   1. Implantable loop recorder extraction  2. Implantable loop recorder implantation    INTRODUCTION:  Amber [name] presents with a history of cryptogenic stroke The costs of loop recorder monitoring have been discussed with the patient.    DESCRIPTION OF PROCEDURE:  Informed written consent was obtained.   Time Out Completed with RN   The patient required no sedation for the procedure today.  The existing loop recorder was able to be localized by palpating over the left parasternal area, where there was a noticeable scar.  The patients left chest, over this area, was therefore prepped and draped in the usual sterile fashion. The skin overlying the left parasternal region was infiltrated  generously with lidocaine for local analgesia.  A 0.5-cm incision was made over the edge of the old loop recorder through the prior scar. Blunt dissection was then performed with a hemostat until the end of the loop recorder was able to be secured and removed successfully from the pocket. The existing subcutaneous ILR pocket was extended using a combination of sharp and blunt dissection to make room for a new device.  A Med[employer]onic Reveal LINQ 2 implantable loop recorder (serial # [id number]) was then placed into the pocket  R waves were very prominent and measuring  0.69mV .  Steri- Strips and a sterile dressing were then applied.  There were no early apparent complications.     CONCLUSIONS:   1. Successful removal of old Med[employer]onic implantable loop recorder.   2. Successful implantation of a implantable loop recorder for a history of cryptogenic stroke  3. No early apparent complications.   Nobie Putnam, MD  Cardiac Electrophysiology

## 2023-06-30 ENCOUNTER — Telehealth: Payer: Self-pay

## 2023-06-30 NOTE — Telephone Encounter (Signed)
Pt states she spoke with [employer] tech support and they states that the pt needs to come into the office and get onto the programmer to get the blutooth turned on.   I called [employer] tech support to get help with the app.  [employer] will give her a call back.

## 2023-06-30 NOTE — Telephone Encounter (Signed)
Pt monitor has updated. 06/30/2023.

## 2023-07-01 ENCOUNTER — Other Ambulatory Visit: Payer: Self-pay | Admitting: Adult Health

## 2023-07-01 DIAGNOSIS — M545 Low back pain, unspecified: Secondary | ICD-10-CM

## 2023-07-07 ENCOUNTER — Other Ambulatory Visit: Payer: Self-pay | Admitting: Primary Care

## 2023-07-07 DIAGNOSIS — K219 Gastro-esophageal reflux disease without esophagitis: Secondary | ICD-10-CM

## 2023-07-10 ENCOUNTER — Other Ambulatory Visit: Payer: Self-pay | Admitting: Primary Care

## 2023-07-10 DIAGNOSIS — R519 Headache, unspecified: Secondary | ICD-10-CM

## 2023-07-10 NOTE — Telephone Encounter (Signed)
 Patient is due for CPE/follow up in late June, this will be required prior to any further refills.  Please schedule, thank you!

## 2023-07-11 NOTE — Telephone Encounter (Signed)
 She was receiving that from her neurologist. Recommend she contact them for a refill.

## 2023-07-11 NOTE — Telephone Encounter (Signed)
 Patient has been scheduled. She was wanting to know if she get the medication for restless leg syndrome sent in as well.

## 2023-07-12 DIAGNOSIS — M545 Low back pain, unspecified: Secondary | ICD-10-CM

## 2023-07-12 MED ORDER — CYCLOBENZAPRINE HCL 10 MG PO TABS: 10.0000 mg | ORAL_TABLET | Freq: Three times a day (TID) | ORAL | 0 refills | Status: AC | PRN

## 2023-07-12 NOTE — Telephone Encounter (Signed)
Called patient and reviewed all information. Patient verbalized understanding. Will call if any further questions.

## 2023-07-20 DIAGNOSIS — F330 Major depressive disorder, recurrent, mild: Secondary | ICD-10-CM | POA: Diagnosis not present

## 2023-08-01 DIAGNOSIS — F4322 Adjustment disorder with anxiety: Secondary | ICD-10-CM | POA: Diagnosis not present

## 2023-08-01 DIAGNOSIS — F900 Attention-deficit hyperactivity disorder, predominantly inattentive type: Secondary | ICD-10-CM | POA: Diagnosis not present

## 2023-08-04 ENCOUNTER — Ambulatory Visit: Payer: BC Managed Care – PPO

## 2023-08-04 DIAGNOSIS — I639 Cerebral infarction, unspecified: Secondary | ICD-10-CM | POA: Diagnosis not present

## 2023-08-06 LAB — CUP PACEART REMOTE DEVICE CHECK
Date Time Interrogation Session: 20250306143310
Implantable Pulse Generator Implant Date: 20250128

## 2023-08-08 ENCOUNTER — Inpatient Hospital Stay: Payer: BC Managed Care – PPO | Attending: Internal Medicine

## 2023-08-08 ENCOUNTER — Encounter: Payer: Self-pay | Admitting: Internal Medicine

## 2023-08-08 ENCOUNTER — Inpatient Hospital Stay (HOSPITAL_BASED_OUTPATIENT_CLINIC_OR_DEPARTMENT_OTHER): Payer: BC Managed Care – PPO | Admitting: Internal Medicine

## 2023-08-08 VITALS — BP 138/99 | HR 72 | Temp 97.3°F | Resp 20 | Ht 62.5 in | Wt 164.2 lb

## 2023-08-08 DIAGNOSIS — Z7902 Long term (current) use of antithrombotics/antiplatelets: Secondary | ICD-10-CM | POA: Insufficient documentation

## 2023-08-08 DIAGNOSIS — D72819 Decreased white blood cell count, unspecified: Secondary | ICD-10-CM

## 2023-08-08 DIAGNOSIS — E669 Obesity, unspecified: Secondary | ICD-10-CM | POA: Insufficient documentation

## 2023-08-08 DIAGNOSIS — Z79899 Other long term (current) drug therapy: Secondary | ICD-10-CM | POA: Diagnosis not present

## 2023-08-08 DIAGNOSIS — Z8673 Personal history of transient ischemic attack (TIA), and cerebral infarction without residual deficits: Secondary | ICD-10-CM | POA: Insufficient documentation

## 2023-08-08 LAB — CBC WITH DIFFERENTIAL (CANCER CENTER ONLY)
Abs Immature Granulocytes: 0.02 10*3/uL (ref 0.00–0.07)
Basophils Absolute: 0 10*3/uL (ref 0.0–0.1)
Basophils Relative: 1 %
Eosinophils Absolute: 0.1 10*3/uL (ref 0.0–0.5)
Eosinophils Relative: 3 %
HCT: 45.3 % (ref 36.0–46.0)
Hemoglobin: 14.2 g/dL (ref 12.0–15.0)
Immature Granulocytes: 0 %
Lymphocytes Relative: 40 %
Lymphs Abs: 1.9 10*3/uL (ref 0.7–4.0)
MCH: 28.6 pg (ref 26.0–34.0)
MCHC: 31.3 g/dL (ref 30.0–36.0)
MCV: 91.3 fL (ref 80.0–100.0)
Monocytes Absolute: 0.4 10*3/uL (ref 0.1–1.0)
Monocytes Relative: 8 %
Neutro Abs: 2.4 10*3/uL (ref 1.7–7.7)
Neutrophils Relative %: 48 %
Platelet Count: 248 10*3/uL (ref 150–400)
RBC: 4.96 MIL/uL (ref 3.87–5.11)
RDW: 13.4 % (ref 11.5–15.5)
WBC Count: 4.8 10*3/uL (ref 4.0–10.5)
nRBC: 0 % (ref 0.0–0.2)

## 2023-08-08 LAB — CMP (CANCER CENTER ONLY)
ALT: 26 U/L (ref 0–44)
AST: 22 U/L (ref 15–41)
Albumin: 4.3 g/dL (ref 3.5–5.0)
Alkaline Phosphatase: 65 U/L (ref 38–126)
Anion gap: 8 (ref 5–15)
BUN: 17 mg/dL (ref 6–20)
CO2: 24 mmol/L (ref 22–32)
Calcium: 9 mg/dL (ref 8.9–10.3)
Chloride: 106 mmol/L (ref 98–111)
Creatinine: 1.02 mg/dL — ABNORMAL HIGH (ref 0.44–1.00)
GFR, Estimated: >60 mL/min (ref >60)
Glucose, Bld: 80 mg/dL (ref 70–99)
Potassium: 3.4 mmol/L — ABNORMAL LOW (ref 3.5–5.1)
Sodium: 138 mmol/L (ref 135–145)
Total Bilirubin: 0.4 mg/dL (ref 0.0–1.2)
Total Protein: 7.6 g/dL (ref 6.5–8.1)

## 2023-08-08 LAB — HIV ANTIBODY (ROUTINE TESTING W REFLEX): HIV Screen 4th Generation wRfx: NONREACTIVE

## 2023-08-08 LAB — HEPATITIS B SURFACE ANTIGEN: Hepatitis B Surface Ag: NONREACTIVE

## 2023-08-08 LAB — LACTATE DEHYDROGENASE: LDH: 182 U/L (ref 98–192)

## 2023-08-08 NOTE — Progress Notes (Signed)
 Fever: NO Chills: NO Body aches: NO Headaches: NO Fatigue: YES Shortness of breath or coughing: NO Mouth ulcers:  Rashes and frequent itching:  NO  1/25 pt had a heart monitor placed for strokes.  Cousin murdered 1/25, dad just had a stroke, daughter was in an accident.

## 2023-08-08 NOTE — Progress Notes (Signed)
 [facility] CONSULT NOTE  Patient Care Team: Doreene Nest, NP as PCP - General (Internal Medicine) End, Cristal Deer, MD as PCP - Cardiology (Cardiology) Nobie Putnam, MD as PCP - Electrophysiology (Cardiology) Earna Coder, MD as Consulting Physician (Oncology)  # CHIEF COMPLAINTS/PURPOSE OF CONSULTATION: Leucopenia  # LEUCOPENIA/NEUTROPENIA- ANC; Hb; platelets; CT Ab/US; Hepatitis/HIV; Alcohol  #  Oncology History   No history exists.    HISTORY OF PRESENTING ILLNESS: Patient ambulating-independently, accompanied by the daughter  Amber [name] 53 y.o.  female with a history stroke of unclear etiology [2016-first] is here for further  follow up in leucopenia.    patient on 1/25 pt had a heart monitor for arrhthymias given history of stroke. Cousin murdered 1/25, dad just had a stroke- under stress.   Denies any infections.   Review of Systems  Constitutional:  Negative for chills, diaphoresis, fever, malaise/fatigue and weight loss.  HENT:  Negative for nosebleeds and sore throat.   Eyes:  Negative for double vision.  Respiratory:  Negative for cough, hemoptysis, sputum production, shortness of breath and wheezing.   Cardiovascular:  Negative for chest pain, palpitations, orthopnea and leg swelling.  Gastrointestinal:  Negative for abdominal pain, blood in stool, constipation, diarrhea, heartburn, melena, nausea and vomiting.  Genitourinary:  Negative for dysuria, frequency and urgency.  Musculoskeletal:  Negative for back pain and joint pain.  Skin: Negative.  Negative for itching and rash.  Neurological:  Negative for dizziness, tingling, focal weakness, weakness and headaches.  Endo/Heme/Allergies:  Does not bruise/bleed easily.  Psychiatric/Behavioral:  Negative for depression. The patient is not nervous/anxious and does not have insomnia.      MEDICAL HISTORY:  Past Medical History:  Diagnosis Date   Anxiety    self reported   Asthma     Cellulitis, toe 11/18/2021   Left great toe  No abscess    Depression    controlled   Diabetes mellitus without complication (HCC)    Hyperlipidemia    controlled with medication   IBS (irritable bowel syndrome)    Left shoulder pain 08/02/2018   Muscle cramps    Precordial pain 09/29/2014   Recurrent strokes (HCC)    Slurred speech    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   Stroke (HCC) 03/13/2015   Tension headache 11/18/2020   Viral upper respiratory tract infection 05/07/2022   Word finding difficulty 02/27/2018    SURGICAL HISTORY: Past Surgical History:  Procedure Laterality Date   ABDOMINAL HYSTERECTOMY  10/2006   bowel reconstruction  10/2006   with hysterectomy   CESAREAN SECTION  2005   EP IMPLANTABLE DEVICE N/A 03/17/2015   Procedure: Loop Recorder Insertion;  Surgeon: Will Jorja Loa, MD;  Location: [facility] INVASIVE CV LAB;  Service: Cardiovascular;  Laterality: N/A;   TEE WITHOUT CARDIOVERSION N/A 03/17/2015   Procedure: TRANSESOPHAGEAL ECHOCARDIOGRAM (TEE);  Surgeon: Chilton Si, MD;  Location: [facility] ENDOSCOPY;  Service: Cardiovascular;  Laterality: N/A;   TEE WITHOUT CARDIOVERSION N/A 01/06/2021   Procedure: TRANSESOPHAGEAL ECHOCARDIOGRAM (TEE);  Surgeon: [name], MD;  Location: [facility] ORS;  Service: Cardiovascular;  Laterality: N/A;   TOE SURGERY     Left 2nd metatarsal    SOCIAL HISTORY: Social History   Socioeconomic History   Marital status: Single    Spouse name: Not on file   Number of children: Not on file   Years of education: Not on file   Highest education level: Not on file  Occupational History   Not on file  Tobacco  Use   Smoking status: Never   Smokeless tobacco: Never  Vaping Use   Vaping status: Never Used  Substance and Sexual Activity   Alcohol use: Not Currently    Alcohol/week: 7.0 standard drinks of alcohol    Types: 7 Glasses of wine per week    Comment: red wine occasionally but not since stroke   Drug use: Not  Currently    Types: Marijuana    Comment: edibles-twice weekly   Sexual activity: Not Currently  Other Topics Concern   Not on file  Social History Narrative   Originally from [place]   Family lives up here in [place]   Has one daughter.   Enjoys spending time shopping and spending time with family    Social Drivers of Corporate investment banker Strain: Not on file  Food Insecurity: No Food Insecurity (05/06/2023)   Hunger Vital Sign    Worried About Running Out of Food in the Last Year: Never true    Ran Out of Food in the Last Year: Never true  Transportation Needs: No Transportation Needs (05/06/2023)   PRAPARE - Administrator, Civil Service (Medical): No    Lack of Transportation (Non-Medical): No  Physical Activity: Not on file  Stress: Not on file  Social Connections: Not on file  Intimate Partner Violence: Not At Risk (05/06/2023)   Humiliation, Afraid, Rape, and Kick questionnaire    Fear of Current or Ex-Partner: No    Emotionally Abused: No    Physically Abused: No    Sexually Abused: No    FAMILY HISTORY: Family History  Problem Relation Age of Onset   Hyperlipidemia Mother    Heart failure Father    Arthritis Father    Diabetes Father    Hypertension Father    Hyperlipidemia Father    Prostate cancer Father    Stroke Father    Heart attack Brother 49   Prostate cancer Maternal Uncle        x 3   Heart attack Maternal Grandfather    Hyperlipidemia Maternal Grandfather    Prostate cancer Maternal Grandfather    Diabetes Paternal Grandmother    Stroke Paternal Grandmother    Diabetes Paternal Grandfather    Heart attack Paternal Grandfather    Colon cancer Neg Hx    Colon polyps Neg Hx    Stomach cancer Neg Hx    Esophageal cancer Neg Hx    Pancreatic cancer Neg Hx     ALLERGIES:  is allergic to cephalexin.  MEDICATIONS:  Current Outpatient Medications  Medication Sig Dispense Refill   albuterol (VENTOLIN HFA) 108 (90  Base) MCG/ACT inhaler Inhale 2 puffs into the lungs every 4 (four) hours as needed for wheezing or shortness of breath (cough, shortness of breath or wheezing.). 1 each 1   Ascorbic Acid (VITAMIN C) 1000 MG tablet Take 1 tablet (1,000 mg total) by mouth daily. 30 tablet 0   azelastine (ASTELIN) 0.1 % nasal spray Place 1 spray into both nostrils 2 (two) times daily. Use in each nostril as directed (Patient taking differently: Place 1 spray into both nostrils 2 (two) times daily. Use in each nostril as directed PRN) 30 mL 0   cholecalciferol (VITAMIN D3) 25 MCG (1000 UNIT) tablet Take 1 tablet (1,000 Units total) by mouth daily. 30 tablet 0   clopidogrel (PLAVIX) 75 MG tablet Take 1 tablet (75 mg total) by mouth daily. 90 tablet 3   cyclobenzaprine (FLEXERIL) 10  MG tablet Take 1 tablet (10 mg total) by mouth 3 (three) times daily as needed for muscle spasms. 30 tablet 0   ezetimibe (ZETIA) 10 MG tablet TAKE 1 TABLET BY MOUTH ONCE DAILY FOR CHOLESTEROL 90 tablet 1   Multiple Vitamins-Minerals (MULTIVITAMIN WITH MINERALS) tablet Take 1 tablet by mouth daily.     pantoprazole (PROTONIX) 20 MG tablet TAKE 1 TABLET BY MOUTH TWICE DAILY BEFORE A MEAL FOR  HEARTBURN 180 tablet 0   rosuvastatin (CRESTOR) 20 MG tablet TAKE 1 TABLET BY MOUTH ONCE DAILY FOR CHOLESTEROL 90 tablet 1   sertraline (ZOLOFT) 100 MG tablet Take by mouth.     tirzepatide (MOUNJARO) 2.5 MG/0.5ML Pen Inject 2.5 mg into the skin once a week. 2 mL 0   topiramate (TOPAMAX) 25 MG tablet TAKE 1 TABLET BY MOUTH ONCE DAILY FOR  HEADACHE  PREVENTION 90 tablet 0   No current facility-administered medications for this visit.      PHYSICAL EXAMINATION:  Vitals:   08/08/23 1537  BP: (!) 138/99  Pulse: 72  Resp: 20  Temp: (!) 97.3 F (36.3 C)  SpO2: 100%   Filed Weights   08/08/23 1537  Weight: 164 lb 3.2 oz (74.5 kg)    Physical Exam Vitals and nursing note reviewed.  HENT:     Head: Normocephalic and atraumatic.      Mouth/Throat:     Pharynx: Oropharynx is clear.  Eyes:     Extraocular Movements: Extraocular movements intact.     Pupils: Pupils are equal, round, and reactive to light.  Cardiovascular:     Rate and Rhythm: Normal rate and regular rhythm.  Pulmonary:     Comments: Decreased breath sounds bilaterally.  Abdominal:     Palpations: Abdomen is soft.  Musculoskeletal:        General: Normal range of motion.     Cervical back: Normal range of motion.  Skin:    General: Skin is warm.  Neurological:     General: No focal deficit present.     Mental Status: She is alert and oriented to person, place, and time.  Psychiatric:        Behavior: Behavior normal.        Judgment: Judgment normal.      LABORATORY DATA:  I have reviewed the data as listed Lab Results  Component Value Date   WBC 4.8 08/08/2023   HGB 14.2 08/08/2023   HCT 45.3 08/08/2023   MCV 91.3 08/08/2023   PLT 248 08/08/2023   Recent Labs    01/26/23 1940 02/01/23 1220 04/07/23 0917 04/27/23 0831 08/08/23 1526  NA 139 142 140  --  138  K 3.3* 3.8 4.0  --  3.4*  CL 105 105 106  --  106  CO2 27 29 28   --  24  GLUCOSE 98 81 94  --  80  BUN 14 13 13   --  17  CREATININE 1.00 1.14 1.05  --  1.02*  CALCIUM 9.7 9.8 9.9  --  9.0  GFRNONAA >60  --   --   --  >60  PROT 8.4*  --   --  6.6 7.6  ALBUMIN 4.5  --   --  4.1 4.3  AST 24  --   --  23 22  ALT 36  --   --  32 26  ALKPHOS 62  --   --  70 65  BILITOT 0.7  --   --  0.5 0.4  BILIDIR  --   --   --  0.1  --     RADIOGRAPHIC STUDIES: I have personally reviewed the radiological images as listed and agreed with the findings in the report. CUP PACEART REMOTE DEVICE CHECK Result Date: 08/06/2023 ILR summary report received. Battery status OK. Normal device function. No new symptom, tachy, brady, or pause episodes. No new AF episodes. Monthly summary reports and ROV/PRN ML, CVRS   ASSESSMENT & PLAN:   Leucopenia # Leukopenia- no differential; 3.2  normal  hemoglobin/platelets. Patient is asymptomatic-no increased risk of infections. Suspect benign causes rather than any malignant causes. MARCH 2025- # today- labs- WBC/diff; hb/platelets- WML-   # I discussed the possibility/need for a bone marrow biopsy if significant neutropenia is continued. However we will plan to hold bone marrow biopsy for now.  Await repeat blood work again in 6 months.  #  Stroke- multiple of unclear etiology [2016-first] - on plavix/ Topomax [Dr.Sethi; [facility]]  # Obesity: on Mujauro.   # DISPOSITION: # NO labs today # follow up in 6 months-  MD;  labs-CBC CMP;  -Dr.B  Cc; Mayra Reel, PCP     All questions were answered. The patient knows to call the clinic with any problems, questions or concerns.      Earna Coder, MD 08/08/2023 4:10 PM

## 2023-08-08 NOTE — Assessment & Plan Note (Addendum)
#   Leukopenia- no differential; 3.2  normal hemoglobin/platelets. Patient is asymptomatic-no increased risk of infections. Suspect benign causes rather than any malignant causes. MARCH 2025- # today- labs- WBC/diff; hb/platelets- WML-   # I discussed the possibility/need for a bone marrow biopsy if significant neutropenia is continued. However we will plan to hold bone marrow biopsy for now.  Await repeat blood work again in 6 months.  #  Stroke- multiple of unclear etiology [2016-first] - on plavix/ Topomax [Dr.Sethi; [facility]]  # Obesity: on Mujauro.   # DISPOSITION: # NO labs today # follow up in 6 months-  MD;  labs-CBC CMP;  -Dr.B  Cc; Mayra Reel, PCP

## 2023-08-10 LAB — ZINC: Zinc: 78 ug/dL (ref 44–115)

## 2023-08-10 LAB — COPPER, SERUM: Copper: 146 ug/dL (ref 80–158)

## 2023-08-11 ENCOUNTER — Encounter: Payer: Self-pay | Admitting: Family

## 2023-08-11 ENCOUNTER — Ambulatory Visit (INDEPENDENT_AMBULATORY_CARE_PROVIDER_SITE_OTHER): Admitting: Family

## 2023-08-11 VITALS — BP 120/82 | HR 110 | Temp 98.2°F | Ht 62.0 in | Wt 160.8 lb

## 2023-08-11 DIAGNOSIS — J029 Acute pharyngitis, unspecified: Secondary | ICD-10-CM

## 2023-08-11 DIAGNOSIS — J22 Unspecified acute lower respiratory infection: Secondary | ICD-10-CM

## 2023-08-11 DIAGNOSIS — B9689 Other specified bacterial agents as the cause of diseases classified elsewhere: Secondary | ICD-10-CM | POA: Diagnosis not present

## 2023-08-11 DIAGNOSIS — R051 Acute cough: Secondary | ICD-10-CM

## 2023-08-11 DIAGNOSIS — Z20822 Contact with and (suspected) exposure to covid-19: Secondary | ICD-10-CM

## 2023-08-11 DIAGNOSIS — R059 Cough, unspecified: Secondary | ICD-10-CM

## 2023-08-11 LAB — POC COVID19 BINAXNOW: SARS Coronavirus 2 Ag: NEGATIVE

## 2023-08-11 LAB — POCT RAPID STREP A (OFFICE): Rapid Strep A Screen: NEGATIVE

## 2023-08-11 LAB — POCT INFLUENZA A/B
Influenza A, POC: NEGATIVE
Influenza B, POC: NEGATIVE

## 2023-08-11 MED ORDER — BENZONATATE 200 MG PO CAPS: 200.0000 mg | ORAL_CAPSULE | Freq: Two times a day (BID) | ORAL | 0 refills | Status: DC | PRN

## 2023-08-11 MED ORDER — GUAIFENESIN-CODEINE 100-10 MG/5ML PO SOLN: 5.0000 mL | Freq: Three times a day (TID) | ORAL | 0 refills | Status: AC | PRN

## 2023-08-11 NOTE — Assessment & Plan Note (Signed)
 Covid strep and flu negative  Take antibiotic as prescribed. Increase oral fluids. Pt to f/u if sx worsen and or fail to improve in 2-3 days.

## 2023-08-11 NOTE — Progress Notes (Signed)
 Established Patient Office Visit  Subjective:   Patient ID: Amber [name], female    DOB: 1971-03-13  Age: 53 y.o. MRN: [id number]  CC:  Chief Complaint  Patient presents with   Acute Visit    Cough and headache    HPI: Amber [name] is a 53 y.o. female presenting on 08/11/2023 for Acute Visit (Cough and headache)  Two days ago started with non productive cough , chest congestion, sob only when coughing, sore throat, headache and feeling really tired.   Has not tested for covid at home.   Otc meds include mucinex DM.  She is a Public relations account executive at [employer].        ROS: Negative unless specifically indicated above in HPI.   Relevant past medical history reviewed and updated as indicated.   Allergies and medications reviewed and updated.   Current Outpatient Medications:    benzonatate (TESSALON) 200 MG capsule, Take 1 capsule (200 mg total) by mouth 2 (two) times daily as needed for cough., Disp: 20 capsule, Rfl: 0   guaiFENesin-codeine 100-10 MG/5ML syrup, Take 5 mLs by mouth 3 (three) times daily as needed for up to 5 days., Disp: 75 mL, Rfl: 0   albuterol (VENTOLIN HFA) 108 (90 Base) MCG/ACT inhaler, Inhale 2 puffs into the lungs every 4 (four) hours as needed for wheezing or shortness of breath (cough, shortness of breath or wheezing.)., Disp: 1 each, Rfl: 1   Ascorbic Acid (VITAMIN C) 1000 MG tablet, Take 1 tablet (1,000 mg total) by mouth daily., Disp: 30 tablet, Rfl: 0   azelastine (ASTELIN) 0.1 % nasal spray, Place 1 spray into both nostrils 2 (two) times daily. Use in each nostril as directed (Patient taking differently: Place 1 spray into both nostrils 2 (two) times daily. Use in each nostril as directed PRN), Disp: 30 mL, Rfl: 0   cholecalciferol (VITAMIN D3) 25 MCG (1000 UNIT) tablet, Take 1 tablet (1,000 Units total) by mouth daily., Disp: 30 tablet, Rfl: 0   clopidogrel (PLAVIX) 75 MG tablet, Take 1 tablet (75 mg total) by mouth daily., Disp: 90 tablet,  Rfl: 3   cyclobenzaprine (FLEXERIL) 10 MG tablet, Take 1 tablet (10 mg total) by mouth 3 (three) times daily as needed for muscle spasms., Disp: 30 tablet, Rfl: 0   ezetimibe (ZETIA) 10 MG tablet, TAKE 1 TABLET BY MOUTH ONCE DAILY FOR CHOLESTEROL, Disp: 90 tablet, Rfl: 1   Multiple Vitamins-Minerals (MULTIVITAMIN WITH MINERALS) tablet, Take 1 tablet by mouth daily., Disp: , Rfl:    pantoprazole (PROTONIX) 20 MG tablet, TAKE 1 TABLET BY MOUTH TWICE DAILY BEFORE A MEAL FOR  HEARTBURN, Disp: 180 tablet, Rfl: 0   rosuvastatin (CRESTOR) 20 MG tablet, TAKE 1 TABLET BY MOUTH ONCE DAILY FOR CHOLESTEROL, Disp: 90 tablet, Rfl: 1   sertraline (ZOLOFT) 100 MG tablet, Take by mouth., Disp: , Rfl:    tirzepatide (MOUNJARO) 2.5 MG/0.5ML Pen, Inject 2.5 mg into the skin once a week., Disp: 2 mL, Rfl: 0   topiramate (TOPAMAX) 25 MG tablet, TAKE 1 TABLET BY MOUTH ONCE DAILY FOR  HEADACHE  PREVENTION, Disp: 90 tablet, Rfl: 0  Allergies  Allergen Reactions   Cephalexin Hives and Rash    Can take Augmentin    Objective:   BP 120/82 (BP Location: Left Arm, Patient Position: Sitting, Cuff Size: Normal)   Pulse (!) 110   Temp 98.2 F (36.8 C) (Temporal)   Ht 5\' 2"  (1.575 m)   Wt 160 lb 12.8 oz (72.9 kg)  SpO2 99%   BMI 29.41 kg/m    Physical Exam Constitutional:      General: She is not in acute distress.    Appearance: Normal appearance. She is normal weight. She is ill-appearing. She is not toxic-appearing or diaphoretic.  HENT:     Head: Normocephalic.     Right Ear: Tympanic membrane normal.     Left Ear: Tympanic membrane normal.     Nose: Nose normal.     Mouth/Throat:     Mouth: Mucous membranes are dry.     Pharynx: No oropharyngeal exudate or posterior oropharyngeal erythema.  Eyes:     Extraocular Movements: Extraocular movements intact.     Pupils: Pupils are equal, round, and reactive to light.  Cardiovascular:     Rate and Rhythm: Normal rate and regular rhythm.     Pulses: Normal  pulses.     Heart sounds: Normal heart sounds.  Pulmonary:     Effort: Pulmonary effort is normal.     Breath sounds: Normal breath sounds.  Musculoskeletal:     Cervical back: Normal range of motion.  Neurological:     General: No focal deficit present.     Mental Status: She is alert and oriented to person, place, and time. Mental status is at baseline.  Psychiatric:        Mood and Affect: Mood normal.        Behavior: Behavior normal.        Thought Content: Thought content normal.        Judgment: Judgment normal.     Assessment & Plan:  Suspected COVID-19 virus infection -     POC COVID-19 BinaxNow  Cough, unspecified type -     POCT Influenza A/B  Sore throat -     POCT rapid strep A  Acute cough -     guaiFENesin-Codeine; Take 5 mLs by mouth 3 (three) times daily as needed for up to 5 days.  Dispense: 75 mL; Refill: 0 -     Benzonatate; Take 1 capsule (200 mg total) by mouth 2 (two) times daily as needed for cough.  Dispense: 20 capsule; Refill: 0  Bacterial lower respiratory infection Assessment & Plan: Covid strep and flu negative  Take antibiotic as prescribed. Increase oral fluids. Pt to f/u if sx worsen and or fail to improve in 2-3 days.       Follow up plan: Return if symptoms worsen or fail to improve.  Mort Sawyers, FNP

## 2023-08-15 ENCOUNTER — Telehealth: Payer: Self-pay | Admitting: Family

## 2023-08-15 NOTE — Telephone Encounter (Signed)
 Spoke with pt and she is aware of Tabitha's response. Routing message to [place] [facility] for follow up.

## 2023-08-15 NOTE — Telephone Encounter (Signed)
 Spoke with pt. States that she was seen again over the weekend due to her asthma "kicking up." Pt was written out of work until 08/17/23 but is wanting to be out of work until 08/29/23 to give her body time to rest, so her short term leave paperwork will need to be filled out. She tried to schedule an appointment with Jae Dire but she does not have any available appointments at this time.

## 2023-08-15 NOTE — Telephone Encounter (Signed)
 Did we start after her visit any antibiotic?  She has a visit with me tomorrow afternoon, can we get more information on what that visit is for? Symptoms not improving? Needs note for work? What symptoms is she currently having?

## 2023-08-15 NOTE — Telephone Encounter (Signed)
 This particular situation for long term continuous leave (any time frame > 3 days) should be written out and documented by her PCP. I would defer this appt to Jae Dire as she is her PCP and that way Jae Dire can decide if this is appropriate or not. I would be unable to sign.

## 2023-08-16 ENCOUNTER — Encounter: Payer: Self-pay | Admitting: Family Medicine

## 2023-08-16 ENCOUNTER — Ambulatory Visit: Admitting: Family

## 2023-08-16 ENCOUNTER — Ambulatory Visit (INDEPENDENT_AMBULATORY_CARE_PROVIDER_SITE_OTHER): Admitting: Family Medicine

## 2023-08-16 ENCOUNTER — Ambulatory Visit: Payer: Self-pay | Admitting: Primary Care

## 2023-08-16 VITALS — BP 113/91 | HR 82 | Ht 62.0 in | Wt 160.7 lb

## 2023-08-16 DIAGNOSIS — J069 Acute upper respiratory infection, unspecified: Secondary | ICD-10-CM

## 2023-08-16 DIAGNOSIS — J4541 Moderate persistent asthma with (acute) exacerbation: Secondary | ICD-10-CM | POA: Diagnosis not present

## 2023-08-16 MED ORDER — FLUTICASONE PROPIONATE HFA 110 MCG/ACT IN AERO: 2.0000 | INHALATION_SPRAY | Freq: Two times a day (BID) | RESPIRATORY_TRACT | 12 refills | Status: DC

## 2023-08-16 MED ORDER — AZITHROMYCIN 250 MG PO TABS: ORAL_TABLET | ORAL | 0 refills | Status: AC

## 2023-08-16 NOTE — Telephone Encounter (Addendum)
 Noted and agree with Tabitha. Given her request, she will need evaluation by me. Please have her scheduled. Virtual visit is fine.

## 2023-08-16 NOTE — Progress Notes (Signed)
 Acute Office Visit  Introduced to nurse practitioner role and practice setting.  All questions answered.  Discussed provider/patient relationship and expectations.  Subjective:     Patient ID: Amber [name], female    DOB: 22-Sep-1970, 53 y.o.   MRN: [id number]  Chief Complaint  Patient presents with   Cough    Has been dealing with this for a week now Went to her pcp on 08/11/23 was written out of work until Monday 08/15/23 [facility] 08[facility]/23 since she was not getting better and was given a breathing treatment and cough medicine with codeine in it. Has also taken mucinex DM as well to help   Amber [name] "Amber [name]" is a 53 year old female who presents with URI symptoms and asthma exacerbation.  She is experiencing increased fatigue and difficulty managing symptoms, particularly when talking, which exacerbates her cough. She started a prednisone on Saturday, currently taking 20 mg twice daily, and is on day three of the course. She also uses an albuterol inhaler in the morning and at night, especially when talking worsens her symptoms. She has been prescribed a cough syrup with codeine and benzonatate. She has not been prescribed ABX.  Her cough has improved from being 'more wetty and harder' to better, although she still feels fatigued and not fully recovered. She denies having a fever but experiences night sweats and headaches, which she attributes to the coughing.  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 She has not worked since last week due to symptoms, and is still having difficulty with general ADLs at home due to fatigue and coughing.  Cough This is a recurrent problem. The current episode started 1 to 4 weeks ago. The problem has been gradually improving. The problem occurs every few minutes. The cough is Non-productive. Associated symptoms include chills, a fever, headaches, myalgias, nasal congestion, rhinorrhea, shortness of breath  and sweats. Pertinent negatives include no chest pain, ear congestion, ear pain, heartburn, hemoptysis, postnasal drip, rash, sore throat, weight loss or wheezing. She has tried a beta-agonist inhaler for the symptoms. The treatment provided mild relief. Her past medical history is significant for asthma.   Review of Systems  Constitutional:  Positive for chills and fever. Negative for weight loss.  HENT:  Positive for rhinorrhea. Negative for ear pain, postnasal drip and sore throat.   Respiratory:  Positive for cough and shortness of breath. Negative for hemoptysis and wheezing.   Cardiovascular:  Negative for chest pain.  Gastrointestinal:  Negative for heartburn.  Musculoskeletal:  Positive for myalgias.  Skin:  Negative for rash.  Neurological:  Positive for headaches.      Objective:    BP (!) 113/91   Pulse 82   Ht 5\' 2"  (1.575 m)   Wt 160 lb 11.2 oz (72.9 kg)   SpO2 100%   BMI 29.39 kg/m    Physical Exam Constitutional:      General: She is not in acute distress.    Appearance: She is well-developed and normal weight. She is ill-appearing. She is not toxic-appearing or diaphoretic.     Comments: Appears tired and fatigued  HENT:     Head: Normocephalic.     Right Ear: Tympanic membrane and ear canal normal. No drainage, swelling or tenderness. No middle ear effusion. Tympanic membrane is not erythematous.     Left Ear: Tympanic membrane and ear canal normal. No drainage, swelling or tenderness.  No middle ear effusion.  Tympanic membrane is not erythematous.     Nose: Congestion and rhinorrhea present.     Mouth/Throat:     Mouth: Mucous membranes are moist. No oral lesions.     Pharynx: Uvula midline. Posterior oropharyngeal erythema present. No pharyngeal swelling, oropharyngeal exudate or uvula swelling.     Tonsils: No tonsillar exudate or tonsillar abscesses. 0 on the right. 0 on the left.  Eyes:     General:        Right eye: No discharge.        Left eye: No  discharge.     Extraocular Movements:     Right eye: Normal extraocular motion.     Left eye: Normal extraocular motion.     Conjunctiva/sclera: Conjunctivae normal.     Pupils: Pupils are equal, round, and reactive to light.  Cardiovascular:     Rate and Rhythm: Normal rate and regular rhythm.     Pulses: Normal pulses.     Heart sounds: Normal heart sounds. No murmur heard.    No gallop.  Pulmonary:     Effort: Pulmonary effort is normal. No tachypnea, bradypnea, prolonged expiration, respiratory distress or retractions.     Breath sounds: No stridor. No wheezing, rhonchi or rales.     Comments: Diminished but no wheezing. Coughing and Sob with talking Chest:     Chest wall: No tenderness.  Lymphadenopathy:     Cervical: No cervical adenopathy.  Skin:    General: Skin is warm and dry.     Capillary Refill: Capillary refill takes less than 2 seconds.  Neurological:     General: No focal deficit present.     Mental Status: She is alert and oriented to person, place, and time. Mental status is at baseline.  Psychiatric:        Mood and Affect: Mood normal.        Behavior: Behavior normal.        Thought Content: Thought content normal.        Judgment: Judgment normal.    No results found for any visits on 08/16/23.     Assessment & Plan:   Problem List Items Addressed This Visit   None Visit Diagnoses       Moderate persistent asthma with exacerbation    -  Primary   Relevant Medications   fluticasone (FLOVENT HFA) 110 MCG/ACT inhaler   azithromycin (ZITHROMAX) 250 MG tablet     Upper respiratory infection, acute       Relevant Medications   azithromycin (ZITHROMAX) 250 MG tablet     Asthma exacerbation likely triggered by an upper respiratory infection.  Sp02 = 100%, RR normal, SOB with talking, persistent cough, fatigue  Lung sounds diminished, but clear, no wheezing. Current medications include prednisone 20 mg BID for five more doses, benzonatate, and codeine  cough syrup.  Albuterol inhaler used as needed. Only taking twice per day- instructed to use every 6 hours as needed.  Unfortunately, do not had breathing treatments in office but recommend if symptoms persist or worsen - pt should present to ED.  - Prescribe Flovent BID for daily use to improve brochial dilation and inflammation. - Continue albuterol inhaler every six hours as needed. - Continue prednisone as currently prescribed. - Continue benzonatate and cough syrup for symptomatic relief. - Advise rest and delay return to work until next Monday 08/22/23. Given work note. - Encourage increased fluid intake and use of humidification. - Advise to seek urgent care or emergency  department if breathing worsens.  Worked up for URI last week - has been neg for flu, COVID Appears to had started as viral, but given asthma exacerbation and severity of symptoms will prescribe azithromycin for potential risk of pneumonia.  Meds ordered this encounter  Medications   fluticasone (FLOVENT HFA) 110 MCG/ACT inhaler    Sig: Inhale 2 puffs into the lungs 2 (two) times daily.    Dispense:  1 each    Refill:  12   azithromycin (ZITHROMAX) 250 MG tablet    Sig: Take 2 tablets on day 1, then 1 tablet daily on days 2 through 5    Dispense:  6 tablet    Refill:  0    Return if symptoms worsen or fail to improve.  I, Sallee Provencal, FNP, have reviewed all documentation for this visit. The documentation on 08/16/23 for the exam, diagnosis, procedures, and orders are all accurate and complete.   Sallee Provencal, FNP

## 2023-08-16 NOTE — Telephone Encounter (Signed)
 Chief Complaint: cough, SOB w/ asthma Symptoms: cough, nasal congestion, headache, mild SOB w/ asthma, CP only with coughing Frequency: 1 wk Pertinent Negatives: Patient denies fever, CP not while cough, vomiting Disposition: [] ED /[] Urgent Care (no appt availability in office) / [x] Appointment(In office/virtual)/ []  [facility]/ [] Home Care/ [] Refused Recommended Disposition /[] [facility]/ []  Follow-up with PCP Additional Notes: Pt reports 1 wk of cough, congestion, headache. Pt was seen 3/13 and prescribed antibiotics and cough medicine which she states has helped some. Pt still coughing. Hx of asthma. Using inhaler frequently which she states helps. Endorses SOB but none with rest. "Some" wheezing. Endorses SOB only with coughing. Per protocol RN advised pt to be seen within 4 hours. Pt agreeable to be seen at an outside office being that her home office has no availability. Pt scheduled for today at 1420 at [facility]. RN advised pt she needs to call 911 for CP that is worse/without coughing or worsening SOB to which she verbalized understanding.   Copied from CRM [id number]. Topic: Clinical - Red Word Triage >> Aug 16, 2023 12:02 PM Adele Barthel wrote: Red Word that prompted transfer to Nurse Triage:   Cough for 1 week Have history of asthma and bronchitis  Cough varies between wet and dry Headache Nasal congestion  Was seen in clinic last week and urgent care this weekend.  Was given cough syrup. Reason for Disposition  [1] MILD difficulty breathing (e.g., minimal/no SOB at rest, SOB with walking, pulse <100) AND [2] still present when not coughing  Answer Assessment - Initial Assessment Questions 1. ONSET: "When did the cough begin?"      1 wk ago 2. SEVERITY: "How bad is the cough today?"      Coughing very frequently - coughing a lot more with talking 3. SPUTUM: "Describe the color of your sputum" (none, dry cough; clear, white, yellow, green)     I'm not coughing up  anything - it's really dry 4. HEMOPTYSIS: "Are you coughing up any blood?" If so ask: "How much?" (flecks, streaks, tablespoons, etc.)     No  5. DIFFICULTY BREATHING: "Are you having difficulty breathing?" If Yes, ask: "How bad is it?" (e.g., mild, moderate, severe)    - MILD: No SOB at rest, mild SOB with walking, speaks normally in sentences, can lie down, no retractions, pulse < 100.    - MODERATE: SOB at rest, SOB with minimal exertion and prefers to sit, cannot lie down flat, speaks in phrases, mild retractions, audible wheezing, pulse 100-120.    - SEVERE: Very SOB at rest, speaks in single words, struggling to breathe, sitting hunched forward, retractions, pulse > 120      Mild, none at rest 6. FEVER: "Do you have a fever?" If Yes, ask: "What is your temperature, how was it measured, and when did it start?"     Sweating a lot but not has not taken her temperature  7. CARDIAC HISTORY: "Do you have any history of heart disease?" (e.g., heart attack, congestive heart failure)      CVA 8. LUNG HISTORY: "Do you have any history of lung disease?"  (e.g., pulmonary embolus, asthma, emphysema)     Asthma 9. PE RISK FACTORS: "Do you have a history of blood clots?" (or: recent major surgery, recent prolonged travel, bedridden)     No 10. OTHER SYMPTOMS: "Do you have any other symptoms?" (e.g., runny nose, wheezing, chest pain)       Saturday went to UC.  Taking antibiotic and tessalon and codeine from 3/13 office visit. Hx of asthma - using asthma inhaler. Some wheezing. No SOB at rest. Hx of stroke. States she has a device inside her heart monitoring it called "[employer]." Wondering if she has pneumonia. CP only with coughing. Endorses headache and nasal congestion.  Protocols used: Cough - Acute Productive-A-AH

## 2023-08-16 NOTE — Telephone Encounter (Signed)
 Unable to reach patient. Left voicemail to return call to our office.

## 2023-08-16 NOTE — Telephone Encounter (Signed)
 Just now seeing this message. If no improvement then needs in office visit. I see that she is scheduled with [facility]. Appreciate their evaluation.

## 2023-08-17 NOTE — Telephone Encounter (Signed)
 Called and spoke with patient, she was seen at [facility] yesterday and they wrote her out of work until Monday. Patient will assess how she is feeling then and let us know if she needs to schedule appt to consider being out of work longer.

## 2023-08-23 ENCOUNTER — Encounter: Payer: Self-pay | Admitting: Internal Medicine

## 2023-08-23 ENCOUNTER — Ambulatory Visit (INDEPENDENT_AMBULATORY_CARE_PROVIDER_SITE_OTHER): Admitting: Internal Medicine

## 2023-08-23 ENCOUNTER — Ambulatory Visit: Admitting: Nurse Practitioner

## 2023-08-23 VITALS — BP 122/80 | HR 78 | Temp 99.1°F | Ht 62.0 in | Wt 160.0 lb

## 2023-08-23 DIAGNOSIS — R051 Acute cough: Secondary | ICD-10-CM

## 2023-08-23 DIAGNOSIS — J45909 Unspecified asthma, uncomplicated: Secondary | ICD-10-CM | POA: Insufficient documentation

## 2023-08-23 DIAGNOSIS — J4521 Mild intermittent asthma with (acute) exacerbation: Secondary | ICD-10-CM

## 2023-08-23 DIAGNOSIS — J45901 Unspecified asthma with (acute) exacerbation: Secondary | ICD-10-CM | POA: Insufficient documentation

## 2023-08-23 MED ORDER — BENZONATATE 200 MG PO CAPS: 200.0000 mg | ORAL_CAPSULE | Freq: Two times a day (BID) | ORAL | 0 refills | Status: DC | PRN

## 2023-08-23 NOTE — Assessment & Plan Note (Signed)
 This is most severe attack in over 30 years Has not had trouble with cold, infections or pollen that she can recall Most likely this was bad chest infection (viral?) Will continue the flovent (start using spacer) for the next month or so---then see PCP to decide if she can stop it again Okay to return to work now

## 2023-08-23 NOTE — Progress Notes (Signed)
 Subjective:    Patient ID: Amber [name], female    DOB: 1971/01/21, 53 y.o.   MRN: [id number]  HPI Here after recent respiratory infection and for work clearance  Had a bad infection and asthma spell Goes back 2 weeks Bad cough---now some better and dry Had fever for some time Had SOB and wheezing  Prednisone and antibiotic Started flovent started 1 week ago--2 puffs bid (no spacer) Does rinse mouth  Feeling better now Ready to go back to work  Asthma since childhood--but never hospitalized This is first severe asthma attack since high school Has allergies--OTC med helps. No asthma flares in the past with this  Current Outpatient Medications on File Prior to Visit  Medication Sig Dispense Refill   albuterol (VENTOLIN HFA) 108 (90 Base) MCG/ACT inhaler Inhale 2 puffs into the lungs every 4 (four) hours as needed for wheezing or shortness of breath (cough, shortness of breath or wheezing.). 1 each 1   Ascorbic Acid (VITAMIN C) 1000 MG tablet Take 1 tablet (1,000 mg total) by mouth daily. 30 tablet 0   azelastine (ASTELIN) 0.1 % nasal spray Place 1 spray into both nostrils 2 (two) times daily. Use in each nostril as directed (Patient taking differently: Place 1 spray into both nostrils 2 (two) times daily. Use in each nostril as directed PRN) 30 mL 0   benzonatate (TESSALON) 200 MG capsule Take 1 capsule (200 mg total) by mouth 2 (two) times daily as needed for cough. 20 capsule 0   cholecalciferol (VITAMIN D3) 25 MCG (1000 UNIT) tablet Take 1 tablet (1,000 Units total) by mouth daily. 30 tablet 0   clopidogrel (PLAVIX) 75 MG tablet Take 1 tablet (75 mg total) by mouth daily. 90 tablet 3   cyclobenzaprine (FLEXERIL) 10 MG tablet Take 1 tablet (10 mg total) by mouth 3 (three) times daily as needed for muscle spasms. 30 tablet 0   ezetimibe (ZETIA) 10 MG tablet TAKE 1 TABLET BY MOUTH ONCE DAILY FOR CHOLESTEROL 90 tablet 1   fluticasone (FLOVENT HFA) 110 MCG/ACT inhaler Inhale 2  puffs into the lungs 2 (two) times daily. 1 each 12   Multiple Vitamins-Minerals (MULTIVITAMIN WITH MINERALS) tablet Take 1 tablet by mouth daily.     pantoprazole (PROTONIX) 20 MG tablet TAKE 1 TABLET BY MOUTH TWICE DAILY BEFORE A MEAL FOR  HEARTBURN 180 tablet 0   rosuvastatin (CRESTOR) 20 MG tablet TAKE 1 TABLET BY MOUTH ONCE DAILY FOR CHOLESTEROL 90 tablet 1   sertraline (ZOLOFT) 100 MG tablet Take by mouth.     tirzepatide (MOUNJARO) 2.5 MG/0.5ML Pen Inject 2.5 mg into the skin once a week. 2 mL 0   topiramate (TOPAMAX) 25 MG tablet TAKE 1 TABLET BY MOUTH ONCE DAILY FOR  HEADACHE  PREVENTION 90 tablet 0   No current facility-administered medications on file prior to visit.    Allergies  Allergen Reactions   Cephalexin Hives and Rash    Can take Augmentin    Past Medical History:  Diagnosis Date   Anxiety    self reported   Asthma    Cellulitis, toe 11/18/2021   Left great toe  No abscess    Depression    controlled   Diabetes mellitus without complication (HCC)    Hyperlipidemia    controlled with medication   IBS (irritable bowel syndrome)    Left shoulder pain 08/02/2018   Muscle cramps    Precordial pain 09/29/2014   Recurrent strokes (HCC)    Slurred speech  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   Stroke (HCC) 03/13/2015   Tension headache 11/18/2020   Viral upper respiratory tract infection 05/07/2022   Word finding difficulty 02/27/2018    Past Surgical History:  Procedure Laterality Date   ABDOMINAL HYSTERECTOMY  10/2006   bowel reconstruction  10/2006   with hysterectomy   CESAREAN SECTION  2005   EP IMPLANTABLE DEVICE N/A 03/17/2015   Procedure: Loop Recorder Insertion;  Surgeon: Will Jorja Loa, MD;  Location: [facility];  Service: Cardiovascular;  Laterality: N/A;   TEE WITHOUT CARDIOVERSION N/A 03/17/2015   Procedure: TRANSESOPHAGEAL ECHOCARDIOGRAM (TEE);  Surgeon: Chilton Si, MD;  Location: [facility] ENDOSCOPY;  Service: Cardiovascular;   Laterality: N/A;   TEE WITHOUT CARDIOVERSION N/A 01/06/2021   Procedure: TRANSESOPHAGEAL ECHOCARDIOGRAM (TEE);  Surgeon: [name], MD;  Location: [facility] ORS;  Service: Cardiovascular;  Laterality: N/A;   TOE SURGERY     Left 2nd metatarsal    Family History  Problem Relation Age of Onset   Hyperlipidemia Mother    Heart failure Father    Arthritis Father    Diabetes Father    Hypertension Father    Hyperlipidemia Father    Prostate cancer Father    Stroke Father    Heart attack Brother 57   Prostate cancer Maternal Uncle        x 3   Heart attack Maternal Grandfather    Hyperlipidemia Maternal Grandfather    Prostate cancer Maternal Grandfather    Diabetes Paternal Grandmother    Stroke Paternal Grandmother    Diabetes Paternal Grandfather    Heart attack Paternal Grandfather    Colon cancer Neg Hx    Colon polyps Neg Hx    Stomach cancer Neg Hx    Esophageal cancer Neg Hx    Pancreatic cancer Neg Hx     Social History   Socioeconomic History   Marital status: Single    Spouse name: Not on file   Number of children: Not on file   Years of education: Not on file   Highest education level: Not on file  Occupational History   Not on file  Tobacco Use   Smoking status: Never   Smokeless tobacco: Never  Vaping Use   Vaping status: Never Used  Substance and Sexual Activity   Alcohol use: Not Currently    Alcohol/week: 7.0 standard drinks of alcohol    Types: 7 Glasses of wine per week    Comment: red wine occasionally but not since stroke   Drug use: Not Currently    Types: Marijuana    Comment: edibles-twice weekly   Sexual activity: Not Currently  Other Topics Concern   Not on file  Social History Narrative   Originally from [place]   Family lives up here in [place]   Has one daughter.   Enjoys spending time shopping and spending time with family    Social Drivers of Corporate investment banker Strain: Not on file  Food Insecurity: No  Food Insecurity (05/06/2023)   Hunger Vital Sign    Worried About Running Out of Food in the Last Year: Never true    Ran Out of Food in the Last Year: Never true  Transportation Needs: No Transportation Needs (05/06/2023)   PRAPARE - Administrator, Civil Service (Medical): No    Lack of Transportation (Non-Medical): No  Physical Activity: Not on file  Stress: Not on file  Social Connections: Not on file  Intimate  Partner Violence: Not At Risk (05/06/2023)   Humiliation, Afraid, Rape, and Kick questionnaire    Fear of Current or Ex-Partner: No    Emotionally Abused: No    Physically Abused: No    Sexually Abused: No   Review of Systems Brief nausea with antibiotic--worked hard on hydration Still sleeping more than usual     Objective:   Physical Exam Constitutional:      Appearance: Normal appearance.  HENT:     Mouth/Throat:     Pharynx: No oropharyngeal exudate or posterior oropharyngeal erythema.  Cardiovascular:     Rate and Rhythm: Normal rate and regular rhythm.     Heart sounds: No murmur heard.    No gallop.  Pulmonary:     Effort: Pulmonary effort is normal.     Breath sounds: Normal breath sounds. No wheezing or rales.  Musculoskeletal:     Cervical back: Neck supple.  Lymphadenopathy:     Cervical: No cervical adenopathy.  Neurological:     Mental Status: She is alert.            Assessment & Plan:

## 2023-08-28 ENCOUNTER — Other Ambulatory Visit: Payer: Self-pay | Admitting: Primary Care

## 2023-08-28 DIAGNOSIS — E1165 Type 2 diabetes mellitus with hyperglycemia: Secondary | ICD-10-CM

## 2023-08-28 NOTE — Telephone Encounter (Signed)
 Please call patient:  Received refill request for Mounjaro 2.5 mg dose. This was last prescribed in November for a 1 month supply. Did she recently just start it? Or did she never get the 2.5 mg dose?

## 2023-08-29 DIAGNOSIS — F3342 Major depressive disorder, recurrent, in full remission: Secondary | ICD-10-CM | POA: Diagnosis not present

## 2023-08-29 DIAGNOSIS — F900 Attention-deficit hyperactivity disorder, predominantly inattentive type: Secondary | ICD-10-CM | POA: Diagnosis not present

## 2023-08-29 LAB — HM DIABETES EYE EXAM

## 2023-08-29 NOTE — Telephone Encounter (Signed)
 Unable to reach patient. Left voicemail to return call to our office.

## 2023-08-30 NOTE — Telephone Encounter (Signed)
 Noted. Rx for 2.5 mg dose sent to pharmacy

## 2023-08-30 NOTE — Telephone Encounter (Signed)
 Called and spoke with patient she states she picked up and took the 1 month supply in Nov. She then had a upper respiratory infection and states that is why she didn't take any more after that. She is now ready to begin injections again.

## 2023-09-08 ENCOUNTER — Ambulatory Visit (INDEPENDENT_AMBULATORY_CARE_PROVIDER_SITE_OTHER): Payer: BC Managed Care – PPO

## 2023-09-08 DIAGNOSIS — I639 Cerebral infarction, unspecified: Secondary | ICD-10-CM

## 2023-09-09 LAB — CUP PACEART REMOTE DEVICE CHECK
Date Time Interrogation Session: 20250410143310
Implantable Pulse Generator Implant Date: 20250128

## 2023-09-09 NOTE — Addendum Note (Signed)
 Addended by: Elease Etienne A on: 09/09/2023 11:57 AM   Modules accepted: Orders

## 2023-09-09 NOTE — Progress Notes (Signed)
 [facility] Summary Report / Loop Recorder

## 2023-09-13 ENCOUNTER — Ambulatory Visit (INDEPENDENT_AMBULATORY_CARE_PROVIDER_SITE_OTHER): Admitting: Primary Care

## 2023-09-13 ENCOUNTER — Encounter: Payer: Self-pay | Admitting: Primary Care

## 2023-09-13 VITALS — BP 126/82 | HR 105 | Temp 97.2°F | Ht 62.0 in | Wt 160.0 lb

## 2023-09-13 DIAGNOSIS — J4530 Mild persistent asthma, uncomplicated: Secondary | ICD-10-CM

## 2023-09-13 NOTE — Assessment & Plan Note (Signed)
 Improved and controlled.  Continue Flovent 110 mcg 2 puffs BID at least through pollen season. Continue albuterol inhaler PRN.   She will update if anything changes.

## 2023-09-13 NOTE — Patient Instructions (Signed)
 Continue Flovent 110 mcg inhaler, 2 puffs twice daily at least until pollen season is over.  Use the rescue inhaler as needed.   It was a pleasure to see you today!

## 2023-09-13 NOTE — Progress Notes (Signed)
 Subjective:    Patient ID: Amber [name], female    DOB: December 14, 1970, 53 y.o.   MRN: [id number]  HPI  Amber [name] is a very pleasant 53 y.o. female with a history of hypertension, asthma, hyperlipidemia, type 2 diabetes, current CVA, GAD, MDD, expressive aphasia, palpitations who presents today   Initially evaluated by Caryl Asp, NP at [facility] family practice on 08/16/2023 for a 1 week history of URI symptoms including cough, fatigue.  She was initiated on Flovent HFA 110 mcg inhaler and azithromycin course.  Evaluated by Dr. Alphonsus Sias on 08/23/2023 for permission to return to work.  Recommendations were to continue Flovent inhaler, follow-up with PCP, return to work.  Today she's feeling much better. Her cough has resolved except for from the pollen. She's been taking OTC antihistamine and Tessalon Perles as needed. She's compliant to her Flovent HFA inhaler, 2 puffs BID. She has returned to work on 08/24/23 and is doing well.   BP Readings from Last 3 Encounters:  09/13/23 126/82  08/23/23 122/80  08/16/23 (!) 113/91      Review of Systems  Respiratory:  Negative for cough, chest tightness, shortness of breath and wheezing.   Cardiovascular:  Negative for chest pain.  Allergic/Immunologic: Positive for environmental allergies.         Past Medical History:  Diagnosis Date   Anxiety    self reported   Asthma    Cellulitis, toe 11/18/2021   Left great toe  No abscess    Depression    controlled   Diabetes mellitus without complication (HCC)    Hyperlipidemia    controlled with medication   IBS (irritable bowel syndrome)    Left shoulder pain 08/02/2018   Muscle cramps    Precordial pain 09/29/2014   Recurrent strokes (HCC)    Slurred speech    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   Stroke (HCC) 03/13/2015   Tension headache 11/18/2020   Viral upper respiratory tract infection 05/07/2022   Word finding difficulty 02/27/2018    Social History   Socioeconomic  History   Marital status: Single    Spouse name: Not on file   Number of children: Not on file   Years of education: Not on file   Highest education level: Not on file  Occupational History   Not on file  Tobacco Use   Smoking status: Never   Smokeless tobacco: Never  Vaping Use   Vaping status: Never Used  Substance and Sexual Activity   Alcohol use: Not Currently    Alcohol/week: 7.0 standard drinks of alcohol    Types: 7 Glasses of wine per week    Comment: red wine occasionally but not since stroke   Drug use: Not Currently    Types: Marijuana    Comment: edibles-twice weekly   Sexual activity: Not Currently  Other Topics Concern   Not on file  Social History Narrative   Originally from [place]   Family lives up here in [place]   Has one daughter.   Enjoys spending time shopping and spending time with family    Social Drivers of Corporate investment banker Strain: Not on file  Food Insecurity: No Food Insecurity (05/06/2023)   Hunger Vital Sign    Worried About Running Out of Food in the Last Year: Never true    Ran Out of Food in the Last Year: Never true  Transportation Needs: No Transportation Needs (05/06/2023)   PRAPARE - Transportation    Lack of  Transportation (Medical): No    Lack of Transportation (Non-Medical): No  Physical Activity: Not on file  Stress: Not on file  Social Connections: Not on file  Intimate Partner Violence: Not At Risk (05/06/2023)   Humiliation, Afraid, Rape, and Kick questionnaire    Fear of Current or Ex-Partner: No    Emotionally Abused: No    Physically Abused: No    Sexually Abused: No    Past Surgical History:  Procedure Laterality Date   ABDOMINAL HYSTERECTOMY  10/2006   bowel reconstruction  10/2006   with hysterectomy   CESAREAN SECTION  2005   EP IMPLANTABLE DEVICE N/A 03/17/2015   Procedure: Loop Recorder Insertion;  Surgeon: Will Cortland Ding, MD;  Location: [facility];  Service:  Cardiovascular;  Laterality: N/A;   TEE WITHOUT CARDIOVERSION N/A 03/17/2015   Procedure: TRANSESOPHAGEAL ECHOCARDIOGRAM (TEE);  Surgeon: Maudine Sos, MD;  Location: [facility] ENDOSCOPY;  Service: Cardiovascular;  Laterality: N/A;   TEE WITHOUT CARDIOVERSION N/A 01/06/2021   Procedure: TRANSESOPHAGEAL ECHOCARDIOGRAM (TEE);  Surgeon: Sammy Crisp, MD;  Location: [facility] ORS;  Service: Cardiovascular;  Laterality: N/A;   TOE SURGERY     Left 2nd metatarsal    Family History  Problem Relation Age of Onset   Hyperlipidemia Mother    Heart failure Father    Arthritis Father    Diabetes Father    Hypertension Father    Hyperlipidemia Father    Prostate cancer Father    Stroke Father    Heart attack Brother 41   Prostate cancer Maternal Uncle        x 3   Heart attack Maternal Grandfather    Hyperlipidemia Maternal Grandfather    Prostate cancer Maternal Grandfather    Diabetes Paternal Grandmother    Stroke Paternal Grandmother    Diabetes Paternal Grandfather    Heart attack Paternal Grandfather    Colon cancer Neg Hx    Colon polyps Neg Hx    Stomach cancer Neg Hx    Esophageal cancer Neg Hx    Pancreatic cancer Neg Hx     Allergies  Allergen Reactions   Cephalexin Hives and Rash    Can take Augmentin    Current Outpatient Medications on File Prior to Visit  Medication Sig Dispense Refill   albuterol (VENTOLIN HFA) 108 (90 Base) MCG/ACT inhaler Inhale 2 puffs into the lungs every 4 (four) hours as needed for wheezing or shortness of breath (cough, shortness of breath or wheezing.). 1 each 1   Ascorbic Acid (VITAMIN C) 1000 MG tablet Take 1 tablet (1,000 mg total) by mouth daily. 30 tablet 0   azelastine (ASTELIN) 0.1 % nasal spray Place 1 spray into both nostrils 2 (two) times daily. Use in each nostril as directed (Patient taking differently: Place 1 spray into both nostrils 2 (two) times daily. Use in each nostril as directed PRN) 30 mL 0   benzonatate (TESSALON) 200 MG  capsule Take 1 capsule (200 mg total) by mouth 2 (two) times daily as needed for cough. 60 capsule 0   cholecalciferol (VITAMIN D3) 25 MCG (1000 UNIT) tablet Take 1 tablet (1,000 Units total) by mouth daily. 30 tablet 0   clopidogrel (PLAVIX) 75 MG tablet Take 1 tablet (75 mg total) by mouth daily. 90 tablet 3   cyclobenzaprine (FLEXERIL) 10 MG tablet Take 1 tablet (10 mg total) by mouth 3 (three) times daily as needed for muscle spasms. 30 tablet 0   ezetimibe (ZETIA) 10 MG tablet TAKE 1  TABLET BY MOUTH ONCE DAILY FOR CHOLESTEROL 90 tablet 1   fluticasone (FLOVENT HFA) 110 MCG/ACT inhaler Inhale 2 puffs into the lungs 2 (two) times daily. 1 each 12   Multiple Vitamins-Minerals (MULTIVITAMIN WITH MINERALS) tablet Take 1 tablet by mouth daily.     pantoprazole (PROTONIX) 20 MG tablet TAKE 1 TABLET BY MOUTH TWICE DAILY BEFORE A MEAL FOR  HEARTBURN 180 tablet 0   rosuvastatin (CRESTOR) 20 MG tablet TAKE 1 TABLET BY MOUTH ONCE DAILY FOR CHOLESTEROL 90 tablet 1   sertraline (ZOLOFT) 100 MG tablet Take by mouth.     tirzepatide (MOUNJARO) 2.5 MG/0.5ML Pen Inject 2.5 mg into the skin once a week. for diabetes. 2 mL 0   topiramate (TOPAMAX) 25 MG tablet TAKE 1 TABLET BY MOUTH ONCE DAILY FOR  HEADACHE  PREVENTION 90 tablet 0   No current facility-administered medications on file prior to visit.    BP 126/82   Pulse (!) 105   Temp (!) 97.2 F (36.2 C) (Temporal)   Ht 5\' 2"  (1.575 m)   Wt 160 lb (72.6 kg)   SpO2 98%   BMI 29.26 kg/m  Objective:   Physical Exam Cardiovascular:     Rate and Rhythm: Normal rate and regular rhythm.  Pulmonary:     Effort: Pulmonary effort is normal.     Breath sounds: Normal breath sounds. No wheezing.  Musculoskeletal:     Cervical back: Neck supple.  Skin:    General: Skin is warm and dry.  Neurological:     Mental Status: She is alert and oriented to person, place, and time.  Psychiatric:        Mood and Affect: Mood normal.           Assessment &  Plan:  Mild persistent asthma without complication Assessment & Plan: Improved and controlled.  Continue Flovent 110 mcg 2 puffs BID at least through pollen season. Continue albuterol inhaler PRN.   She will update if anything changes.          Emric Kowalewski K Trenika Hudson, NP

## 2023-10-13 ENCOUNTER — Ambulatory Visit (INDEPENDENT_AMBULATORY_CARE_PROVIDER_SITE_OTHER): Payer: BC Managed Care – PPO

## 2023-10-13 DIAGNOSIS — I639 Cerebral infarction, unspecified: Secondary | ICD-10-CM

## 2023-10-14 LAB — CUP PACEART REMOTE DEVICE CHECK
Date Time Interrogation Session: 20250515143426
Implantable Pulse Generator Implant Date: 20250128

## 2023-10-17 ENCOUNTER — Ambulatory Visit: Admitting: Physician Assistant

## 2023-10-17 ENCOUNTER — Ambulatory Visit: Admitting: Internal Medicine

## 2023-10-17 ENCOUNTER — Encounter: Payer: Self-pay | Admitting: Internal Medicine

## 2023-10-17 VITALS — BP 114/82 | HR 71 | Temp 97.6°F | Ht 62.0 in | Wt 164.0 lb

## 2023-10-17 DIAGNOSIS — R109 Unspecified abdominal pain: Secondary | ICD-10-CM | POA: Diagnosis not present

## 2023-10-17 DIAGNOSIS — J0140 Acute pansinusitis, unspecified: Secondary | ICD-10-CM | POA: Diagnosis not present

## 2023-10-17 LAB — POC URINALSYSI DIPSTICK (AUTOMATED)
Bilirubin, UA: NEGATIVE
Blood, UA: NEGATIVE
Glucose, UA: NEGATIVE
Ketones, UA: NEGATIVE
Leukocytes, UA: NEGATIVE
Nitrite, UA: NEGATIVE
Protein, UA: NEGATIVE
Spec Grav, UA: 1.02 (ref 1.010–1.025)
Urobilinogen, UA: 0.2 U/dL
pH, UA: 6 (ref 5.0–8.0)

## 2023-10-17 MED ORDER — FLUTICASONE PROPIONATE 50 MCG/ACT NA SUSP: 1.0000 | Freq: Two times a day (BID) | NASAL | 0 refills | Status: DC

## 2023-10-17 MED ORDER — FLUCONAZOLE 150 MG PO TABS: ORAL_TABLET | ORAL | 0 refills | Status: DC

## 2023-10-17 MED ORDER — DOXYCYCLINE HYCLATE 100 MG PO TABS: 100.0000 mg | ORAL_TABLET | Freq: Two times a day (BID) | ORAL | 0 refills | Status: AC

## 2023-10-17 NOTE — Progress Notes (Signed)
 [facility] [address] [place] [address] Dept: [phone] Dept Fax: [phone]  Acute Care Office Visit  Subjective:   Amber [name] June 09, 1970 10/17/2023  Chief Complaint  Patient presents with   Sinus Problem    Started in March    Flank Pain    Started 3 weeks ago    HPI:  Discussed the use of AI scribe software for clinical note transcription with the patient, who gave verbal consent to proceed.  History of Present Illness   Amber Rey "Oakley Bellman" is a 53 year old female with asthma who presents with hoarseness and sinus congestion.  In March, she experienced a significant asthma exacerbation, characterized by severe sinus drainage and a persistent cough, which resulted in her absence from work for two weeks. This was her most severe episode in over a decade. Following this, she improved and returned to work, wearing a mask for two weeks. Currently, she feels well but is concerned about ongoing hoarseness and sinus issues.  About a week and a half ago, she developed hoarseness of her voice, initially attributing it to sinus congestion. She experiences a dry cough and had a severe headache last Friday, likely due to weather changes and pollen exposure. She is taking Excedrin  for migraines, Allegra for allergies, and benzonatate  for cough. She denies fever or body aches.  She has been experiencing back and flank pain for approximately two months, with occasional radiation to her back. She uses Flexeril  for pain relief, which provides some benefit. She is concerned about a potential urinary infection, but denies dysuria or hematuria.     The following portions of the patient's history were reviewed and updated as appropriate: past medical history, past surgical history, family history, social history, allergies, medications, and problem list.   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   Palpitations 03/11/2021   Controlled type 2 diabetes mellitus with hyperglycemia (HCC)    Expressive aphasia    History of CVA (cerebrovascular accident) 01/02/2021   Dizziness 12/18/2020   Frequent headaches 11/18/2020   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   Allergic rhinitis due to pollen 10/15/2019   Concentration deficit 08/14/2019   Non-cardiac chest pain 05/18/2019   Restless leg syndrome 12/29/2016   GERD (gastroesophageal reflux disease) 08/24/2016   Hemorrhoids 08/24/2016   Healthcare maintenance 08/24/2016   GAD (generalized anxiety disorder) 06/22/2016   Levoscoliosis 10/06/2015   IBS (irritable bowel syndrome) 09/29/2014   Hyperlipidemia associated with type 2 diabetes mellitus (HCC) 09/29/2014   Essential hypertension 09/29/2014   Past Medical History:  Diagnosis Date   Anxiety    self reported   Asthma    Cellulitis, toe 11/18/2021   Left great toe  No abscess    Depression    controlled   Diabetes mellitus without complication (HCC)    Hyperlipidemia    controlled with medication   IBS (irritable bowel syndrome)    Left shoulder pain 08/02/2018   Muscle cramps    Precordial pain 09/29/2014   Recurrent strokes (HCC)  Slurred speech    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   Stroke (HCC) 03/13/2015   Tension headache 11/18/2020   Viral upper respiratory tract infection 05/07/2022   Word finding difficulty 02/27/2018   Past Surgical History:  Procedure Laterality Date   ABDOMINAL HYSTERECTOMY  10/2006   bowel reconstruction  10/2006   with  hysterectomy   CESAREAN SECTION  2005   EP IMPLANTABLE DEVICE N/A 03/17/2015   Procedure: Loop Recorder Insertion;  Surgeon: Will Cortland Ding, MD;  Location: [facility];  Service: Cardiovascular;  Laterality: N/A;   TEE WITHOUT CARDIOVERSION N/A 03/17/2015   Procedure: TRANSESOPHAGEAL ECHOCARDIOGRAM (TEE);  Surgeon: Maudine Sos, MD;  Location: [facility] ENDOSCOPY;  Service: Cardiovascular;  Laterality: N/A;   TEE WITHOUT CARDIOVERSION N/A 01/06/2021   Procedure: TRANSESOPHAGEAL ECHOCARDIOGRAM (TEE);  Surgeon: Sammy Crisp, MD;  Location: [facility] ORS;  Service: Cardiovascular;  Laterality: N/A;   TOE SURGERY     Left 2nd metatarsal   Family History  Problem Relation Age of Onset   Hyperlipidemia Mother    Heart failure Father    Arthritis Father    Diabetes Father    Hypertension Father    Hyperlipidemia Father    Prostate cancer Father    Stroke Father    Heart attack Brother 13   Prostate cancer Maternal Uncle        x 3   Heart attack Maternal Grandfather    Hyperlipidemia Maternal Grandfather    Prostate cancer Maternal Grandfather    Diabetes Paternal Grandmother    Stroke Paternal Grandmother    Diabetes Paternal Grandfather    Heart attack Paternal Grandfather    Colon cancer Neg Hx    Colon polyps Neg Hx    Stomach cancer Neg Hx    Esophageal cancer Neg Hx    Pancreatic cancer Neg Hx     Current Outpatient Medications:    albuterol  (VENTOLIN  HFA) 108 (90 Base) MCG/ACT inhaler, Inhale 2 puffs into the lungs every 4 (four) hours as needed for wheezing or shortness of breath (cough, shortness of breath or wheezing.)., Disp: 1 each, Rfl: 1   Ascorbic Acid  (VITAMIN C ) 1000 MG tablet, Take 1 tablet (1,000 mg total) by mouth daily., Disp: 30 tablet, Rfl: 0   benzonatate  (TESSALON ) 200 MG capsule, Take 1 capsule (200 mg total) by mouth 2 (two) times daily as needed for cough., Disp: 60 capsule, Rfl: 0   cholecalciferol  (VITAMIN D3) 25 MCG (1000 UNIT) tablet, Take 1  tablet (1,000 Units total) by mouth daily., Disp: 30 tablet, Rfl: 0   clopidogrel  (PLAVIX ) 75 MG tablet, Take 1 tablet (75 mg total) by mouth daily., Disp: 90 tablet, Rfl: 3   cyclobenzaprine  (FLEXERIL ) 10 MG tablet, Take 1 tablet (10 mg total) by mouth 3 (three) times daily as needed for muscle spasms., Disp: 30 tablet, Rfl: 0   doxycycline  (VIBRA -TABS) 100 MG tablet, Take 1 tablet (100 mg total) by mouth 2 (two) times daily for 7 days., Disp: 14 tablet, Rfl: 0   ezetimibe  (ZETIA ) 10 MG tablet, TAKE 1 TABLET BY MOUTH ONCE DAILY FOR CHOLESTEROL, Disp: 90 tablet, Rfl: 1   fluconazole  (DIFLUCAN ) 150 MG tablet, Take 1 tablet by mouth once. Repeat dose in 3 days if symptoms persist., Disp: 2 tablet, Rfl: 0   fluticasone  (FLONASE ) 50 MCG/ACT nasal spray, Place 1 spray into both nostrils 2 (two) times daily., Disp: 16 g, Rfl: 0   fluticasone  (FLOVENT  HFA) 110 MCG/ACT inhaler, Inhale 2 puffs into the lungs 2 (  two) times daily., Disp: 1 each, Rfl: 12   Multiple Vitamins-Minerals (MULTIVITAMIN WITH MINERALS) tablet, Take 1 tablet by mouth daily., Disp: , Rfl:    pantoprazole  (PROTONIX ) 20 MG tablet, TAKE 1 TABLET BY MOUTH TWICE DAILY BEFORE A MEAL FOR  HEARTBURN, Disp: 180 tablet, Rfl: 0   rosuvastatin  (CRESTOR ) 20 MG tablet, TAKE 1 TABLET BY MOUTH ONCE DAILY FOR CHOLESTEROL, Disp: 90 tablet, Rfl: 1   sertraline  (ZOLOFT ) 100 MG tablet, Take by mouth., Disp: , Rfl:    tirzepatide (MOUNJARO) 2.5 MG/0.5ML Pen, Inject 2.5 mg into the skin once a week. for diabetes., Disp: 2 mL, Rfl: 0   topiramate  (TOPAMAX ) 25 MG tablet, TAKE 1 TABLET BY MOUTH ONCE DAILY FOR  HEADACHE  PREVENTION, Disp: 90 tablet, Rfl: 0   azelastine  (ASTELIN ) 0.1 % nasal spray, Place 1 spray into both nostrils 2 (two) times daily. Use in each nostril as directed (Patient not taking: Reported on 10/17/2023), Disp: 30 mL, Rfl: 0 Allergies  Allergen Reactions   Cephalexin Hives and Rash    Can take Augmentin      ROS: A complete ROS was  performed with pertinent positives/negatives noted in the HPI. The remainder of the ROS are negative.    Objective:   Today's Vitals   10/17/23 1343  BP: 114/82  Pulse: 71  Temp: 97.6 F (36.4 C)  TempSrc: Temporal  SpO2: 99%  Weight: 164 lb (74.4 kg)  Height: 5\' 2"  (1.575 m)    GENERAL: Well-appearing, in NAD. Well nourished.  SKIN: Pink, warm and dry. No rash, lesion, ulceration, or ecchymoses.  HEENT:    HEAD: Normocephalic, non-traumatic.  EYES: Conjunctive pink without exudate.  EARS: External ear w/o redness, swelling, masses, or lesions. EAC clear. TM's intact, translucent w/o bulging, appropriate landmarks visualized.  NOSE: Septum midline w/o deformity. Nares patent, mucosa pink and inflamed with white drainage. Frontal and maxillary sinus tenderness.  THROAT: Uvula midline. Oropharynx clear. Tonsils non-inflamed w/o exudate. Mucus membranes pink and moist.  NECK: Trachea midline. Full ROM w/o pain or tenderness. No lymphadenopathy.  RESPIRATORY: Chest wall symmetrical. Respirations even and non-labored. Breath sounds clear to auscultation bilaterally.  CARDIAC: S1, S2 present, regular rate and rhythm. Peripheral pulses 2+ bilaterally.  GI: Abdomen soft, non-tender. Normoactive bowel sounds. No rebound tenderness. No hepatomegaly or splenomegaly. No CVA tenderness.  MSK: Muscle tone and strength appropriate for age. Joints w/o tenderness, redness, or swelling. EXTREMITIES: Without clubbing, cyanosis, or edema.  NEUROLOGIC: No motor or sensory deficits. Steady, even gait.  PSYCH/MENTAL STATUS: Alert, oriented x 3. Cooperative, appropriate mood and affect.    Results for orders placed or performed in visit on 10/17/23  POCT Urinalysis Dipstick (Automated)  Result Value Ref Range   Color, UA yellow    Clarity, UA clear    Glucose, UA Negative Negative   Bilirubin, UA neg    Ketones, UA neg    Spec Grav, UA 1.020 1.010 - 1.025   Blood, UA neg    pH, UA 6.0 5.0 - 8.0    Protein, UA Negative Negative   Urobilinogen, UA 0.2 0.2 or 1.0 E.U./dL   Nitrite, UA neg    Leukocytes, UA Negative Negative      Assessment & Plan:  Assessment and Plan    Acute Sinusitis - Prescribe doxycycline  100 mg, one capsule twice daily for 7 days, after meals. - Continue Allegra for allergy symptoms. - Order Flonase  nasal spray, one spray in each nostril up to twice daily. - Advise throat  lozenges, warm salt water gargles, and warm tea with honey and lemon for hoarseness. Hoarseness of voice likely caused by PND.  - Recommend vocal rest. - Prescribe Diflucan  for yeast infection after taking ABX.  Flank pain  No infection signs in urine. Consider imaging if persistent.  Meds ordered this encounter  Medications   doxycycline  (VIBRA -TABS) 100 MG tablet    Sig: Take 1 tablet (100 mg total) by mouth 2 (two) times daily for 7 days.    Dispense:  14 tablet    Refill:  0    Supervising Provider:   THOMPSON, AARON B [[id number]]   fluconazole  (DIFLUCAN ) 150 MG tablet    Sig: Take 1 tablet by mouth once. Repeat dose in 3 days if symptoms persist.    Dispense:  2 tablet    Refill:  0    Supervising Provider:   THOMPSON, AARON B [[id number]]   fluticasone  (FLONASE ) 50 MCG/ACT nasal spray    Sig: Place 1 spray into both nostrils 2 (two) times daily.    Dispense:  16 g    Refill:  0    Supervising Provider:   Catheryn Cluck [[id number]]   Orders Placed This Encounter  Procedures   POCT Urinalysis Dipstick (Automated)   Lab Orders         POCT Urinalysis Dipstick (Automated)     No images are attached to the encounter or orders placed in the encounter.  Return if symptoms worsen or fail to improve.   Gavin Kast, FNP

## 2023-10-17 NOTE — Patient Instructions (Addendum)
 Continue Allegra  Do flonase  nasal spray  Throat lozenges, warm salt water gargles , honey/lemon tea , vocal rest

## 2023-10-19 ENCOUNTER — Ambulatory Visit: Payer: Self-pay | Admitting: Cardiology

## 2023-10-21 NOTE — Progress Notes (Signed)
 [facility] Summary Report / Loop Recorder

## 2023-10-21 NOTE — Addendum Note (Signed)
 Addended by: Lott Rouleau A on: 10/21/2023 08:50 AM   Modules accepted: Orders

## 2023-10-25 ENCOUNTER — Other Ambulatory Visit: Payer: Self-pay | Admitting: Primary Care

## 2023-10-25 DIAGNOSIS — R519 Headache, unspecified: Secondary | ICD-10-CM

## 2023-10-28 ENCOUNTER — Ambulatory Visit (HOSPITAL_BASED_OUTPATIENT_CLINIC_OR_DEPARTMENT_OTHER)
Admission: RE | Admit: 2023-10-28 | Discharge: 2023-10-28 | Disposition: A | Discharge status: Home / Self Care | Source: Ambulatory Visit | Attending: Physician Assistant | Admitting: Physician Assistant

## 2023-10-28 ENCOUNTER — Encounter: Payer: Self-pay | Admitting: Physician Assistant

## 2023-10-28 ENCOUNTER — Ambulatory Visit (INDEPENDENT_AMBULATORY_CARE_PROVIDER_SITE_OTHER): Admitting: Physician Assistant

## 2023-10-28 VITALS — BP 138/92 | HR 97 | Temp 98.4°F | Ht 62.0 in | Wt 169.6 lb

## 2023-10-28 DIAGNOSIS — J4531 Mild persistent asthma with (acute) exacerbation: Secondary | ICD-10-CM

## 2023-10-28 DIAGNOSIS — R053 Chronic cough: Secondary | ICD-10-CM | POA: Diagnosis not present

## 2023-10-28 DIAGNOSIS — R059 Cough, unspecified: Secondary | ICD-10-CM | POA: Diagnosis not present

## 2023-10-28 MED ORDER — HYDROCOD POLI-CHLORPHE POLI ER 10-8 MG/5ML PO SUER
5.0000 mL | Freq: Every evening | ORAL | 0 refills | Status: DC | PRN
Start: 2023-10-28 — End: 2023-11-17

## 2023-10-28 MED ORDER — PREDNISONE 20 MG PO TABS: 20.0000 mg | ORAL_TABLET | Freq: Every day | ORAL | 0 refills | Status: DC

## 2023-10-28 NOTE — Progress Notes (Signed)
 Established patient visit   Patient: Amber [name]   DOB: 21-Aug-1970   53 y.o. Female  MRN: [id number] Visit Date: 10/28/2023  Today's healthcare provider: Trenton Frock, PA-C   Cc. cough  Subjective     Pt reports persistent cough, congestion, PND for the last 10 + days. Overall hasn't felt well since her asthma exacerbation in march.  Today,denies wheezing, chest tightness, shortness of breath.  Taking allegra, tessalon . She has finished the doxycycline .  Medications: Outpatient Medications Prior to Visit  Medication Sig   albuterol  (VENTOLIN  HFA) 108 (90 Base) MCG/ACT inhaler Inhale 2 puffs into the lungs every 4 (four) hours as needed for wheezing or shortness of breath (cough, shortness of breath or wheezing.).   Ascorbic Acid  (VITAMIN C ) 1000 MG tablet Take 1 tablet (1,000 mg total) by mouth daily.   azelastine  (ASTELIN ) 0.1 % nasal spray Place 1 spray into both nostrils 2 (two) times daily. Use in each nostril as directed (Patient not taking: Reported on 10/17/2023)   benzonatate  (TESSALON ) 200 MG capsule Take 1 capsule (200 mg total) by mouth 2 (two) times daily as needed for cough.   cholecalciferol  (VITAMIN D3) 25 MCG (1000 UNIT) tablet Take 1 tablet (1,000 Units total) by mouth daily.   clopidogrel  (PLAVIX ) 75 MG tablet Take 1 tablet (75 mg total) by mouth daily.   cyclobenzaprine  (FLEXERIL ) 10 MG tablet Take 1 tablet (10 mg total) by mouth 3 (three) times daily as needed for muscle spasms.   ezetimibe  (ZETIA ) 10 MG tablet TAKE 1 TABLET BY MOUTH ONCE DAILY FOR CHOLESTEROL   fluconazole  (DIFLUCAN ) 150 MG tablet Take 1 tablet by mouth once. Repeat dose in 3 days if symptoms persist.   fluticasone  (FLONASE ) 50 MCG/ACT nasal spray Place 1 spray into both nostrils 2 (two) times daily.   fluticasone  (FLOVENT  HFA) 110 MCG/ACT inhaler Inhale 2 puffs into the lungs 2 (two) times daily.   Multiple Vitamins-Minerals (MULTIVITAMIN WITH MINERALS) tablet Take 1 tablet by mouth  daily.   pantoprazole  (PROTONIX ) 20 MG tablet TAKE 1 TABLET BY MOUTH TWICE DAILY BEFORE A MEAL FOR  HEARTBURN   rosuvastatin  (CRESTOR ) 20 MG tablet TAKE 1 TABLET BY MOUTH ONCE DAILY FOR CHOLESTEROL   sertraline  (ZOLOFT ) 100 MG tablet Take by mouth.   tirzepatide (MOUNJARO) 2.5 MG/0.5ML Pen Inject 2.5 mg into the skin once a week. for diabetes.   topiramate  (TOPAMAX ) 25 MG tablet TAKE 1 TABLET BY MOUTH ONCE DAILY FOR  HEADACHE  PREVENTION   No facility-administered medications prior to visit.    Review of Systems  Constitutional:  Positive for fatigue. Negative for fever.  Respiratory:  Positive for cough. Negative for shortness of breath.   Cardiovascular:  Negative for chest pain and leg swelling.  Gastrointestinal:  Negative for abdominal pain.  Neurological:  Positive for headaches. Negative for dizziness.       Objective    BP (!) 138/92   Pulse 97   Temp 98.4 F (36.9 C)   Ht 5\' 2"  (1.575 m)   Wt 169 lb 9.6 oz (76.9 kg)   SpO2 99%   BMI 31.02 kg/m    Physical Exam Constitutional:      General: She is awake.     Appearance: She is well-developed.  HENT:     Head: Normocephalic.  Eyes:     Conjunctiva/sclera: Conjunctivae normal.  Cardiovascular:     Rate and Rhythm: Normal rate and regular rhythm.     Heart sounds: Normal heart  sounds.  Pulmonary:     Effort: Pulmonary effort is normal.     Breath sounds: Normal breath sounds. No wheezing, rhonchi or rales.  Skin:    General: Skin is warm.  Neurological:     Mental Status: She is alert and oriented to person, place, and time.  Psychiatric:        Attention and Perception: Attention normal.        Mood and Affect: Mood normal.        Speech: Speech normal.        Behavior: Behavior is cooperative.     No results found for any visits on 10/28/23.  Assessment & Plan    Persistent cough -     Hydrocod Poli-Chlorphe Poli ER; Take 5 mLs by mouth at bedtime as needed.  Dispense: 115 mL; Refill: 0 -     DG  Chest 2 View  Mild persistent asthma with acute exacerbation -     predniSONE ; Take 1 tablet (20 mg total) by mouth daily with breakfast.  Dispense: 5 tablet; Refill: 0  No wheezing but the cough sounds asthmatic to me.  Rec cont albuterol  prn, flovent . Rx prednisone  20 mg x 5 days.  Rx tussionex for cough  Given extent of cough/fatigue, ordering chest xray r/o pneumonia.   Return if symptoms worsen or fail to improve.       Trenton Frock, PA-C  [facility] [phone] (phone) [phone] (fax)  [facility]

## 2023-10-31 ENCOUNTER — Ambulatory Visit: Payer: Self-pay | Admitting: Physician Assistant

## 2023-11-03 DIAGNOSIS — F330 Major depressive disorder, recurrent, mild: Secondary | ICD-10-CM | POA: Diagnosis not present

## 2023-11-07 ENCOUNTER — Other Ambulatory Visit: Payer: Self-pay | Admitting: Primary Care

## 2023-11-07 DIAGNOSIS — E785 Hyperlipidemia, unspecified: Secondary | ICD-10-CM

## 2023-11-08 ENCOUNTER — Other Ambulatory Visit: Payer: Self-pay | Admitting: Primary Care

## 2023-11-08 DIAGNOSIS — E1165 Type 2 diabetes mellitus with hyperglycemia: Secondary | ICD-10-CM

## 2023-11-14 ENCOUNTER — Ambulatory Visit

## 2023-11-14 DIAGNOSIS — I639 Cerebral infarction, unspecified: Secondary | ICD-10-CM | POA: Diagnosis not present

## 2023-11-14 LAB — CUP PACEART REMOTE DEVICE CHECK
Date Time Interrogation Session: 20250615143318
Implantable Pulse Generator Implant Date: 20250128

## 2023-11-15 ENCOUNTER — Encounter: Payer: BC Managed Care – PPO | Admitting: Primary Care

## 2023-11-15 ENCOUNTER — Encounter: Admitting: Primary Care

## 2023-11-17 ENCOUNTER — Ambulatory Visit: Payer: Self-pay | Admitting: Primary Care

## 2023-11-17 ENCOUNTER — Encounter: Payer: Self-pay | Admitting: Primary Care

## 2023-11-17 ENCOUNTER — Ambulatory Visit: Payer: Self-pay | Admitting: Cardiology

## 2023-11-17 ENCOUNTER — Ambulatory Visit: Admitting: Primary Care

## 2023-11-17 VITALS — BP 102/70 | HR 58 | Temp 97.2°F | Ht 62.0 in | Wt 167.0 lb

## 2023-11-17 DIAGNOSIS — D72819 Decreased white blood cell count, unspecified: Secondary | ICD-10-CM

## 2023-11-17 DIAGNOSIS — J4530 Mild persistent asthma, uncomplicated: Secondary | ICD-10-CM | POA: Diagnosis not present

## 2023-11-17 DIAGNOSIS — Z8673 Personal history of transient ischemic attack (TIA), and cerebral infarction without residual deficits: Secondary | ICD-10-CM

## 2023-11-17 DIAGNOSIS — Z7985 Long-term (current) use of injectable non-insulin antidiabetic drugs: Secondary | ICD-10-CM

## 2023-11-17 DIAGNOSIS — R519 Headache, unspecified: Secondary | ICD-10-CM

## 2023-11-17 DIAGNOSIS — E1165 Type 2 diabetes mellitus with hyperglycemia: Secondary | ICD-10-CM

## 2023-11-17 DIAGNOSIS — E1169 Type 2 diabetes mellitus with other specified complication: Secondary | ICD-10-CM | POA: Diagnosis not present

## 2023-11-17 DIAGNOSIS — K219 Gastro-esophageal reflux disease without esophagitis: Secondary | ICD-10-CM | POA: Diagnosis not present

## 2023-11-17 DIAGNOSIS — I10 Essential (primary) hypertension: Secondary | ICD-10-CM

## 2023-11-17 DIAGNOSIS — Z0000 Encounter for general adult medical examination without abnormal findings: Secondary | ICD-10-CM | POA: Diagnosis not present

## 2023-11-17 DIAGNOSIS — E785 Hyperlipidemia, unspecified: Secondary | ICD-10-CM

## 2023-11-17 DIAGNOSIS — F411 Generalized anxiety disorder: Secondary | ICD-10-CM

## 2023-11-17 LAB — BASIC METABOLIC PANEL WITH GFR
BUN: 15 mg/dL (ref 6–23)
CO2: 30 meq/L (ref 19–32)
Calcium: 9.3 mg/dL (ref 8.4–10.5)
Chloride: 106 meq/L (ref 96–112)
Creatinine, Ser: 1 mg/dL (ref 0.40–1.20)
GFR: 65.39 mL/min
Glucose, Bld: 92 mg/dL (ref 70–99)
Potassium: 4.1 meq/L (ref 3.5–5.1)
Sodium: 141 meq/L (ref 135–145)

## 2023-11-17 LAB — CBC
HCT: 44.4 % (ref 36.0–46.0)
Hemoglobin: 14.6 g/dL (ref 12.0–15.0)
MCHC: 32.8 g/dL (ref 30.0–36.0)
MCV: 86.6 fl (ref 78.0–100.0)
Platelets: 215 10*3/uL (ref 150.0–400.0)
RBC: 5.13 Mil/uL — ABNORMAL HIGH (ref 3.87–5.11)
RDW: 13.7 % (ref 11.5–15.5)
WBC: 3.7 10*3/uL — ABNORMAL LOW (ref 4.0–10.5)

## 2023-11-17 LAB — LIPID PANEL
Cholesterol: 207 mg/dL — ABNORMAL HIGH (ref 0–200)
HDL: 82.3 mg/dL
LDL Cholesterol: 111 mg/dL — ABNORMAL HIGH (ref 0–99)
NonHDL: 124.8
Total CHOL/HDL Ratio: 3
Triglycerides: 67 mg/dL (ref 0.0–149.0)
VLDL: 13.4 mg/dL (ref 0.0–40.0)

## 2023-11-17 LAB — HEMOGLOBIN A1C: Hgb A1c MFr Bld: 6.2 % (ref 4.6–6.5)

## 2023-11-17 MED ORDER — TIRZEPATIDE 5 MG/0.5ML ~~LOC~~ SOAJ: 5.0000 mg | SUBCUTANEOUS | 0 refills | Status: DC

## 2023-11-17 MED ORDER — ROSUVASTATIN CALCIUM 20 MG PO TABS: 20.0000 mg | ORAL_TABLET | Freq: Every day | ORAL | 3 refills | Status: DC

## 2023-11-17 NOTE — Assessment & Plan Note (Signed)
 No new CVA or symptoms.  Continue rosuvastatin  20 mg daily. Loop recorder in place, reviewed read from June 2025.

## 2023-11-17 NOTE — Assessment & Plan Note (Signed)
 Repeat A1C pending.

## 2023-11-17 NOTE — Assessment & Plan Note (Signed)
 Immunizations UTD. Mammogram UTD Colonoscopy UTD, due 2027  Discussed the importance of a healthy diet and regular exercise in order for weight loss, and to reduce the risk of further co-morbidity.  Exam stable. Labs pending.  Follow up in 1 year for repeat physical.

## 2023-11-17 NOTE — Assessment & Plan Note (Signed)
 Controlled.  Continue Topamax  25 mg HS.  Continue to monitor.

## 2023-11-17 NOTE — Patient Instructions (Signed)
 We increased your dose of Mounjaro  to 5 mg weekly.  Stop by the lab prior to leaving today. I will notify you of your results once received.   Please schedule a follow up visit for 6 months for a diabetes check.  It was a pleasure to see you today!

## 2023-11-17 NOTE — Progress Notes (Signed)
 Subjective:    Patient ID: Amber [name], female    DOB: 22-Oct-1970, 53 y.o.   MRN: [id number]  HPI  Amber [name] is a very pleasant 53 y.o. female who presents today for complete physical and follow up of chronic conditions.  Immunizations: -Tetanus: Completed in 2020 -Shingles: Completed Shingrix  series  Diet: Fair diet.  Exercise: No regular exercise.  Eye exam: Completes annually  Dental exam: Completes semi-annually    Pap Smear: Hysterectomy  Mammogram: Completed in July 2024  Colonoscopy: Completed in 2022, due 2027  BP Readings from Last 3 Encounters:  11/17/23 102/70  10/28/23 (!) 138/92  10/17/23 114/82       Review of Systems  Constitutional:  Negative for unexpected weight change.  HENT:  Negative for rhinorrhea.   Respiratory:  Negative for cough and shortness of breath.   Cardiovascular:  Negative for chest pain.  Gastrointestinal:  Negative for constipation and diarrhea.  Genitourinary:  Negative for difficulty urinating.  Musculoskeletal:  Negative for arthralgias and myalgias.  Skin:  Negative for rash.  Allergic/Immunologic: Negative for environmental allergies.  Neurological:  Negative for dizziness and headaches.  Psychiatric/Behavioral:  The patient is not nervous/anxious.          Past Medical History:  Diagnosis Date   Anxiety    self reported   Asthma    Cellulitis, toe 11/18/2021   Left great toe  No abscess    Depression    controlled   Diabetes mellitus without complication (HCC)    Hyperlipidemia    controlled with medication   IBS (irritable bowel syndrome)    Left facial numbness 02/01/2023   Left lower quadrant abdominal pain 04/07/2023   Left shoulder pain 08/02/2018   Muscle cramps    Precordial pain 09/29/2014   Recurrent strokes (HCC)    Slurred speech    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   Stroke (HCC) 03/13/2015   Tension headache 11/18/2020   Viral upper respiratory tract infection 05/07/2022   Word  finding difficulty 02/27/2018    Social History   Socioeconomic History   Marital status: Single    Spouse name: Not on file   Number of children: Not on file   Years of education: Not on file   Highest education level: Not on file  Occupational History   Not on file  Tobacco Use   Smoking status: Never   Smokeless tobacco: Never  Vaping Use   Vaping status: Never Used  Substance and Sexual Activity   Alcohol use: Not Currently    Alcohol/week: 7.0 standard drinks of alcohol    Types: 7 Glasses of wine per week    Comment: red wine occasionally but not since stroke   Drug use: Not Currently    Types: Marijuana    Comment: edibles-twice weekly   Sexual activity: Not Currently  Other Topics Concern   Not on file  Social History Narrative   Originally from [place]    Family lives up here in [place]    Has one daughter.   Enjoys spending time shopping and spending time with family    Social Drivers of Corporate investment banker Strain: Not on file  Food Insecurity: No Food Insecurity (05/06/2023)   Hunger Vital Sign    Worried About Running Out of Food in the Last Year: Never true    Ran Out of Food in the Last Year: Never true  Transportation Needs: No Transportation Needs (05/06/2023)   PRAPARE - Transportation  Lack of Transportation (Medical): No    Lack of Transportation (Non-Medical): No  Physical Activity: Not on file  Stress: Not on file  Social Connections: Not on file  Intimate Partner Violence: Not At Risk (05/06/2023)   Humiliation, Afraid, Rape, and Kick questionnaire    Fear of Current or Ex-Partner: No    Emotionally Abused: No    Physically Abused: No    Sexually Abused: No    Past Surgical History:  Procedure Laterality Date   ABDOMINAL HYSTERECTOMY  10/2006   bowel reconstruction  10/2006   with hysterectomy   CESAREAN SECTION  2005   EP IMPLANTABLE DEVICE N/A 03/17/2015   Procedure: Loop Recorder Insertion;  Surgeon: Will  Cortland Ding, MD;  Location: [facility];  Service: Cardiovascular;  Laterality: N/A;   TEE WITHOUT CARDIOVERSION N/A 03/17/2015   Procedure: TRANSESOPHAGEAL ECHOCARDIOGRAM (TEE);  Surgeon: Maudine Sos, MD;  Location: [facility] ENDOSCOPY;  Service: Cardiovascular;  Laterality: N/A;   TEE WITHOUT CARDIOVERSION N/A 01/06/2021   Procedure: TRANSESOPHAGEAL ECHOCARDIOGRAM (TEE);  Surgeon: Sammy Crisp, MD;  Location: [facility] ORS;  Service: Cardiovascular;  Laterality: N/A;   TOE SURGERY     Left 2nd metatarsal    Family History  Problem Relation Age of Onset   Hyperlipidemia Mother    Heart failure Father    Arthritis Father    Diabetes Father    Hypertension Father    Hyperlipidemia Father    Prostate cancer Father    Stroke Father    Heart attack Brother 14   Prostate cancer Maternal Uncle        x 3   Heart attack Maternal Grandfather    Hyperlipidemia Maternal Grandfather    Prostate cancer Maternal Grandfather    Diabetes Paternal Grandmother    Stroke Paternal Grandmother    Diabetes Paternal Grandfather    Heart attack Paternal Grandfather    Colon cancer Neg Hx    Colon polyps Neg Hx    Stomach cancer Neg Hx    Esophageal cancer Neg Hx    Pancreatic cancer Neg Hx     Allergies  Allergen Reactions   Cephalexin Hives and Rash    Can take Augmentin     Current Outpatient Medications on File Prior to Visit  Medication Sig Dispense Refill   albuterol  (VENTOLIN  HFA) 108 (90 Base) MCG/ACT inhaler Inhale 2 puffs into the lungs every 4 (four) hours as needed for wheezing or shortness of breath (cough, shortness of breath or wheezing.). 1 each 1   Ascorbic Acid  (VITAMIN C ) 1000 MG tablet Take 1 tablet (1,000 mg total) by mouth daily. 30 tablet 0   azelastine  (ASTELIN ) 0.1 % nasal spray Place 1 spray into both nostrils 2 (two) times daily. Use in each nostril as directed 30 mL 0   cholecalciferol  (VITAMIN D3) 25 MCG (1000 UNIT) tablet Take 1 tablet (1,000 Units total) by  mouth daily. 30 tablet 0   clopidogrel  (PLAVIX ) 75 MG tablet Take 1 tablet (75 mg total) by mouth daily. 90 tablet 3   cyclobenzaprine  (FLEXERIL ) 10 MG tablet Take 1 tablet (10 mg total) by mouth 3 (three) times daily as needed for muscle spasms. 30 tablet 0   ezetimibe  (ZETIA ) 10 MG tablet TAKE 1 TABLET BY MOUTH ONCE DAILY FOR CHOLESTEROL 90 tablet 0   fluticasone  (FLONASE ) 50 MCG/ACT nasal spray Place 1 spray into both nostrils 2 (two) times daily. 16 g 0   fluticasone  (FLOVENT  HFA) 110 MCG/ACT inhaler Inhale 2 puffs into the  lungs 2 (two) times daily. 1 each 12   Multiple Vitamins-Minerals (MULTIVITAMIN WITH MINERALS) tablet Take 1 tablet by mouth daily.     pantoprazole  (PROTONIX ) 20 MG tablet TAKE 1 TABLET BY MOUTH TWICE DAILY BEFORE A MEAL FOR  HEARTBURN 180 tablet 0   rosuvastatin  (CRESTOR ) 20 MG tablet TAKE 1 TABLET BY MOUTH ONCE DAILY FOR CHOLESTEROL 90 tablet 1   sertraline  (ZOLOFT ) 100 MG tablet Take by mouth.     topiramate  (TOPAMAX ) 25 MG tablet TAKE 1 TABLET BY MOUTH ONCE DAILY FOR  HEADACHE  PREVENTION 90 tablet 0   No current facility-administered medications on file prior to visit.    BP 102/70   Pulse (!) 58   Temp (!) 97.2 F (36.2 C) (Temporal)   Ht 5' 2 (1.575 m)   Wt 167 lb (75.8 kg)   SpO2 100%   BMI 30.54 kg/m  Objective:   Physical Exam HENT:     Right Ear: Tympanic membrane and ear canal normal.     Left Ear: Tympanic membrane and ear canal normal.   Eyes:     Pupils: Pupils are equal, round, and reactive to light.    Cardiovascular:     Rate and Rhythm: Normal rate and regular rhythm.  Pulmonary:     Effort: Pulmonary effort is normal.     Breath sounds: Normal breath sounds.  Abdominal:     General: Bowel sounds are normal.     Palpations: Abdomen is soft.     Tenderness: There is no abdominal tenderness.   Musculoskeletal:        General: Normal range of motion.     Cervical back: Neck supple.   Skin:    General: Skin is warm and dry.    Neurological:     Mental Status: She is alert and oriented to person, place, and time.     Cranial Nerves: No cranial nerve deficit.     Deep Tendon Reflexes:     Reflex Scores:      Patellar reflexes are 2+ on the right side and 2+ on the left side.  Psychiatric:        Mood and Affect: Mood normal.           Assessment & Plan:  Healthcare maintenance Assessment & Plan: Immunizations UTD. Mammogram UTD Colonoscopy UTD, due 2027  Discussed the importance of a healthy diet and regular exercise in order for weight loss, and to reduce the risk of further co-morbidity.  Exam stable. Labs pending.  Follow up in 1 year for repeat physical.    Essential hypertension Assessment & Plan: Controlled.  Continue to monitor.   Orders: -     CBC -     Basic metabolic panel with GFR  Mild persistent asthma without complication Assessment & Plan: Controlled.  Continue Flovent  110 mcg 2 puffs BID. Continue albuterol  inhaler PRN.    Gastroesophageal reflux disease, unspecified whether esophagitis present Assessment & Plan: Controlled.  Continue pantoprazole  20 mg once to twice daily.   Controlled type 2 diabetes mellitus with hyperglycemia, without long-term current use of insulin (HCC) Assessment & Plan: Repeat A1C pending.    Orders: -     Tirzepatide ; Inject 5 mg into the skin once a week. for diabetes.  Dispense: 6 mL; Refill: 0 -     Hemoglobin A1c  GAD (generalized anxiety disorder) Assessment & Plan: Controlled.  Following with psychiatry and psychology. Continue sertraline  100 mg daily   History of CVA (cerebrovascular  accident) Assessment & Plan: No new CVA or symptoms.  Continue rosuvastatin  20 mg daily. Loop recorder in place, reviewed read from June 2025.   Frequent headaches Assessment & Plan: Controlled.  Continue Topamax  25 mg HS.  Continue to monitor.    Hyperlipidemia associated with type 2 diabetes mellitus  (HCC) Assessment & Plan: Repeat lipid panel pending.  Continue rosuvastatin  20 mg daily.  Orders: -     Lipid panel  Leukopenia, unspecified type Assessment & Plan: Reviewed hematology notes from March 2025.  Repeat CBC pending today.  Orders: -     CBC        Gabriel John, NP

## 2023-11-17 NOTE — Assessment & Plan Note (Signed)
 Controlled.  Continue to monitor.

## 2023-11-17 NOTE — Assessment & Plan Note (Signed)
 Repeat lipid panel pending. ? ?Continue rosuvastatin 20 mg daily. ?

## 2023-11-17 NOTE — Assessment & Plan Note (Signed)
 Controlled.  Following with psychiatry and psychology. Continue sertraline  100 mg daily

## 2023-11-17 NOTE — Assessment & Plan Note (Addendum)
 Controlled.  Continue pantoprazole  20 mg once to twice daily.

## 2023-11-17 NOTE — Assessment & Plan Note (Signed)
 Reviewed hematology notes from March 2025.  Repeat CBC pending today.

## 2023-11-17 NOTE — Assessment & Plan Note (Signed)
 Controlled.  Continue Flovent  110 mcg 2 puffs BID. Continue albuterol  inhaler PRN.

## 2023-11-22 NOTE — Progress Notes (Signed)
 [facility] Summary Report / Loop Recorder

## 2023-11-22 NOTE — Addendum Note (Signed)
 Addended by: VICCI SELLER A on: 11/22/2023 09:19 AM   Modules accepted: Orders

## 2023-12-05 DIAGNOSIS — M9904 Segmental and somatic dysfunction of sacral region: Secondary | ICD-10-CM | POA: Diagnosis not present

## 2023-12-05 DIAGNOSIS — M9902 Segmental and somatic dysfunction of thoracic region: Secondary | ICD-10-CM | POA: Diagnosis not present

## 2023-12-05 DIAGNOSIS — M9903 Segmental and somatic dysfunction of lumbar region: Secondary | ICD-10-CM | POA: Diagnosis not present

## 2023-12-05 DIAGNOSIS — M9901 Segmental and somatic dysfunction of cervical region: Secondary | ICD-10-CM | POA: Diagnosis not present

## 2023-12-08 DIAGNOSIS — F330 Major depressive disorder, recurrent, mild: Secondary | ICD-10-CM | POA: Diagnosis not present

## 2023-12-13 MED ORDER — TIRZEPATIDE 2.5 MG/0.5ML ~~LOC~~ SOAJ: 2.5000 mg | SUBCUTANEOUS | 0 refills | Status: DC

## 2023-12-15 ENCOUNTER — Ambulatory Visit (INDEPENDENT_AMBULATORY_CARE_PROVIDER_SITE_OTHER)

## 2023-12-15 DIAGNOSIS — I639 Cerebral infarction, unspecified: Secondary | ICD-10-CM | POA: Diagnosis not present

## 2023-12-15 LAB — CUP PACEART REMOTE DEVICE CHECK
Date Time Interrogation Session: 20250717003006
Implantable Pulse Generator Implant Date: 20250128

## 2023-12-18 ENCOUNTER — Ambulatory Visit: Payer: Self-pay | Admitting: Cardiology

## 2023-12-19 DIAGNOSIS — M9902 Segmental and somatic dysfunction of thoracic region: Secondary | ICD-10-CM | POA: Diagnosis not present

## 2023-12-19 DIAGNOSIS — M9903 Segmental and somatic dysfunction of lumbar region: Secondary | ICD-10-CM | POA: Diagnosis not present

## 2023-12-19 DIAGNOSIS — M9904 Segmental and somatic dysfunction of sacral region: Secondary | ICD-10-CM | POA: Diagnosis not present

## 2023-12-19 DIAGNOSIS — M9901 Segmental and somatic dysfunction of cervical region: Secondary | ICD-10-CM | POA: Diagnosis not present

## 2023-12-22 ENCOUNTER — Other Ambulatory Visit: Payer: Self-pay | Admitting: Primary Care

## 2023-12-22 DIAGNOSIS — Z8673 Personal history of transient ischemic attack (TIA), and cerebral infarction without residual deficits: Secondary | ICD-10-CM

## 2023-12-22 NOTE — Progress Notes (Signed)
 [facility] Summary Report / Loop Recorder

## 2023-12-23 DIAGNOSIS — M9902 Segmental and somatic dysfunction of thoracic region: Secondary | ICD-10-CM | POA: Diagnosis not present

## 2023-12-23 DIAGNOSIS — M9904 Segmental and somatic dysfunction of sacral region: Secondary | ICD-10-CM | POA: Diagnosis not present

## 2023-12-23 DIAGNOSIS — M9901 Segmental and somatic dysfunction of cervical region: Secondary | ICD-10-CM | POA: Diagnosis not present

## 2023-12-23 DIAGNOSIS — M9903 Segmental and somatic dysfunction of lumbar region: Secondary | ICD-10-CM | POA: Diagnosis not present

## 2023-12-29 DIAGNOSIS — M9901 Segmental and somatic dysfunction of cervical region: Secondary | ICD-10-CM | POA: Diagnosis not present

## 2023-12-29 DIAGNOSIS — M9903 Segmental and somatic dysfunction of lumbar region: Secondary | ICD-10-CM | POA: Diagnosis not present

## 2023-12-29 DIAGNOSIS — M9902 Segmental and somatic dysfunction of thoracic region: Secondary | ICD-10-CM | POA: Diagnosis not present

## 2023-12-29 DIAGNOSIS — M9904 Segmental and somatic dysfunction of sacral region: Secondary | ICD-10-CM | POA: Diagnosis not present

## 2024-01-04 DIAGNOSIS — M9902 Segmental and somatic dysfunction of thoracic region: Secondary | ICD-10-CM | POA: Diagnosis not present

## 2024-01-04 DIAGNOSIS — M9903 Segmental and somatic dysfunction of lumbar region: Secondary | ICD-10-CM | POA: Diagnosis not present

## 2024-01-04 DIAGNOSIS — M9901 Segmental and somatic dysfunction of cervical region: Secondary | ICD-10-CM | POA: Diagnosis not present

## 2024-01-04 DIAGNOSIS — M9904 Segmental and somatic dysfunction of sacral region: Secondary | ICD-10-CM | POA: Diagnosis not present

## 2024-01-10 DIAGNOSIS — M9901 Segmental and somatic dysfunction of cervical region: Secondary | ICD-10-CM | POA: Diagnosis not present

## 2024-01-10 DIAGNOSIS — M9903 Segmental and somatic dysfunction of lumbar region: Secondary | ICD-10-CM | POA: Diagnosis not present

## 2024-01-10 DIAGNOSIS — M9904 Segmental and somatic dysfunction of sacral region: Secondary | ICD-10-CM | POA: Diagnosis not present

## 2024-01-10 DIAGNOSIS — M9902 Segmental and somatic dysfunction of thoracic region: Secondary | ICD-10-CM | POA: Diagnosis not present

## 2024-01-16 ENCOUNTER — Ambulatory Visit (INDEPENDENT_AMBULATORY_CARE_PROVIDER_SITE_OTHER)

## 2024-01-16 DIAGNOSIS — I639 Cerebral infarction, unspecified: Secondary | ICD-10-CM | POA: Diagnosis not present

## 2024-01-17 LAB — CUP PACEART REMOTE DEVICE CHECK
Date Time Interrogation Session: 20250816233421
Implantable Pulse Generator Implant Date: 20250128

## 2024-01-18 DIAGNOSIS — M9901 Segmental and somatic dysfunction of cervical region: Secondary | ICD-10-CM | POA: Diagnosis not present

## 2024-01-18 DIAGNOSIS — M9904 Segmental and somatic dysfunction of sacral region: Secondary | ICD-10-CM | POA: Diagnosis not present

## 2024-01-18 DIAGNOSIS — M9903 Segmental and somatic dysfunction of lumbar region: Secondary | ICD-10-CM | POA: Diagnosis not present

## 2024-01-18 DIAGNOSIS — M9902 Segmental and somatic dysfunction of thoracic region: Secondary | ICD-10-CM | POA: Diagnosis not present

## 2024-01-24 DIAGNOSIS — M9902 Segmental and somatic dysfunction of thoracic region: Secondary | ICD-10-CM | POA: Diagnosis not present

## 2024-01-24 DIAGNOSIS — M9901 Segmental and somatic dysfunction of cervical region: Secondary | ICD-10-CM | POA: Diagnosis not present

## 2024-01-24 DIAGNOSIS — M9904 Segmental and somatic dysfunction of sacral region: Secondary | ICD-10-CM | POA: Diagnosis not present

## 2024-01-24 DIAGNOSIS — M9903 Segmental and somatic dysfunction of lumbar region: Secondary | ICD-10-CM | POA: Diagnosis not present

## 2024-01-26 DIAGNOSIS — M9904 Segmental and somatic dysfunction of sacral region: Secondary | ICD-10-CM | POA: Diagnosis not present

## 2024-01-26 DIAGNOSIS — M9903 Segmental and somatic dysfunction of lumbar region: Secondary | ICD-10-CM | POA: Diagnosis not present

## 2024-01-26 DIAGNOSIS — M9902 Segmental and somatic dysfunction of thoracic region: Secondary | ICD-10-CM | POA: Diagnosis not present

## 2024-01-26 DIAGNOSIS — M9901 Segmental and somatic dysfunction of cervical region: Secondary | ICD-10-CM | POA: Diagnosis not present

## 2024-01-29 ENCOUNTER — Ambulatory Visit: Payer: Self-pay | Admitting: Cardiology

## 2024-02-07 ENCOUNTER — Inpatient Hospital Stay: Attending: Internal Medicine

## 2024-02-07 ENCOUNTER — Encounter: Payer: Self-pay | Admitting: Internal Medicine

## 2024-02-07 ENCOUNTER — Inpatient Hospital Stay (HOSPITAL_BASED_OUTPATIENT_CLINIC_OR_DEPARTMENT_OTHER): Admitting: Internal Medicine

## 2024-02-07 VITALS — BP 132/92 | HR 77 | Temp 98.6°F | Resp 20 | Ht 62.0 in | Wt 165.4 lb

## 2024-02-07 DIAGNOSIS — Z7902 Long term (current) use of antithrombotics/antiplatelets: Secondary | ICD-10-CM | POA: Insufficient documentation

## 2024-02-07 DIAGNOSIS — Z8673 Personal history of transient ischemic attack (TIA), and cerebral infarction without residual deficits: Secondary | ICD-10-CM | POA: Diagnosis not present

## 2024-02-07 DIAGNOSIS — Z8249 Family history of ischemic heart disease and other diseases of the circulatory system: Secondary | ICD-10-CM | POA: Insufficient documentation

## 2024-02-07 DIAGNOSIS — D72819 Decreased white blood cell count, unspecified: Secondary | ICD-10-CM | POA: Diagnosis not present

## 2024-02-07 DIAGNOSIS — Z823 Family history of stroke: Secondary | ICD-10-CM | POA: Insufficient documentation

## 2024-02-07 DIAGNOSIS — Z79899 Other long term (current) drug therapy: Secondary | ICD-10-CM | POA: Insufficient documentation

## 2024-02-07 DIAGNOSIS — M9903 Segmental and somatic dysfunction of lumbar region: Secondary | ICD-10-CM | POA: Diagnosis not present

## 2024-02-07 DIAGNOSIS — D709 Neutropenia, unspecified: Secondary | ICD-10-CM | POA: Insufficient documentation

## 2024-02-07 DIAGNOSIS — M9901 Segmental and somatic dysfunction of cervical region: Secondary | ICD-10-CM | POA: Diagnosis not present

## 2024-02-07 DIAGNOSIS — R030 Elevated blood-pressure reading, without diagnosis of hypertension: Secondary | ICD-10-CM | POA: Insufficient documentation

## 2024-02-07 DIAGNOSIS — E669 Obesity, unspecified: Secondary | ICD-10-CM | POA: Diagnosis not present

## 2024-02-07 DIAGNOSIS — Z7985 Long-term (current) use of injectable non-insulin antidiabetic drugs: Secondary | ICD-10-CM | POA: Insufficient documentation

## 2024-02-07 DIAGNOSIS — Z7951 Long term (current) use of inhaled steroids: Secondary | ICD-10-CM | POA: Insufficient documentation

## 2024-02-07 DIAGNOSIS — M9902 Segmental and somatic dysfunction of thoracic region: Secondary | ICD-10-CM | POA: Diagnosis not present

## 2024-02-07 DIAGNOSIS — M9904 Segmental and somatic dysfunction of sacral region: Secondary | ICD-10-CM | POA: Diagnosis not present

## 2024-02-07 DIAGNOSIS — Z809 Family history of malignant neoplasm, unspecified: Secondary | ICD-10-CM | POA: Diagnosis not present

## 2024-02-07 LAB — CMP (CANCER CENTER ONLY)
ALT: 33 U/L (ref 0–44)
AST: 24 U/L (ref 15–41)
Albumin: 4.1 g/dL (ref 3.5–5.0)
Alkaline Phosphatase: 71 U/L (ref 38–126)
Anion gap: 7 (ref 5–15)
BUN: 11 mg/dL (ref 6–20)
CO2: 26 mmol/L (ref 22–32)
Calcium: 9.2 mg/dL (ref 8.9–10.3)
Chloride: 104 mmol/L (ref 98–111)
Creatinine: 1.03 mg/dL — ABNORMAL HIGH (ref 0.44–1.00)
GFR, Estimated: >60 mL/min (ref >60)
Glucose, Bld: 96 mg/dL (ref 70–99)
Potassium: 4.2 mmol/L (ref 3.5–5.1)
Sodium: 137 mmol/L (ref 135–145)
Total Bilirubin: 0.6 mg/dL (ref 0.0–1.2)
Total Protein: 7.2 g/dL (ref 6.5–8.1)

## 2024-02-07 LAB — CBC WITH DIFFERENTIAL (CANCER CENTER ONLY)
Abs Immature Granulocytes: 0.01 K/uL (ref 0.00–0.07)
Basophils Absolute: 0 K/uL (ref 0.0–0.1)
Basophils Relative: 1 %
Eosinophils Absolute: 0.1 K/uL (ref 0.0–0.5)
Eosinophils Relative: 2 %
HCT: 45.6 % (ref 36.0–46.0)
Hemoglobin: 14.2 g/dL (ref 12.0–15.0)
Immature Granulocytes: 0 %
Lymphocytes Relative: 49 %
Lymphs Abs: 2.1 K/uL (ref 0.7–4.0)
MCH: 28.2 pg (ref 26.0–34.0)
MCHC: 31.1 g/dL (ref 30.0–36.0)
MCV: 90.7 fL (ref 80.0–100.0)
Monocytes Absolute: 0.4 K/uL (ref 0.1–1.0)
Monocytes Relative: 9 %
Neutro Abs: 1.6 K/uL — ABNORMAL LOW (ref 1.7–7.7)
Neutrophils Relative %: 39 %
Platelet Count: 218 K/uL (ref 150–400)
RBC: 5.03 MIL/uL (ref 3.87–5.11)
RDW: 13.3 % (ref 11.5–15.5)
WBC Count: 4.3 K/uL (ref 4.0–10.5)
nRBC: 0 % (ref 0.0–0.2)

## 2024-02-07 NOTE — Progress Notes (Signed)
 Patient has no concerns

## 2024-02-07 NOTE — Assessment & Plan Note (Addendum)
#   Leukopenia- no differential; 3.2  normal hemoglobin/platelets. Patient is asymptomatic-no increased risk of infections. Suspect benign causes rather than any malignant causes. 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 hold bone marrow biopsy for now.   # Elevated blood pressure-140-160-n systolic.  Recommend check blood pressure at home and also follow-up with PCP.  #  Stroke- multiple of unclear etiology [2016-first] - on plavix / Topomax [Dr.Sethi; [facility]]  # Obesity: on Mujauro.   #Since patient is clinically stable I think is reasonable for the patient to follow-up with PCP/can follow-up with us  as needed.  Patient comfortable with the plan; to call us  if any questions or concerns in the interim.   # DISPOSITION: # repeat BP  # follow up as needed;  -Dr.B  Cc; Mallie Gaskins, PCP

## 2024-02-07 NOTE — Progress Notes (Signed)
 [facility] CONSULT NOTE  Patient Care Team: Gretta Comer POUR, NP as PCP - General (Internal Medicine) End, Lonni, MD as PCP - Cardiology (Cardiology) Kennyth Chew, MD as PCP - Electrophysiology (Cardiology) Rennie Amber SAUNDERS, MD as Consulting Physician (Oncology)  # CHIEF COMPLAINTS/PURPOSE OF CONSULTATION: Leucopenia  # LEUCOPENIA/NEUTROPENIA- ANC; Hb; platelets; CT Ab/US ; Hepatitis/HIV; Alcohol  #  Oncology History   No history exists.    HISTORY OF PRESENTING ILLNESS: Patient ambulating-independently, alone  Callee Rohrig 53 y.o.  female with a history stroke of unclear etiology [2016-first] is here for further  follow up in leucopenia.   Denies any infections.  Noted to have elevated blood pressure this afternoon.  Mild headache.  Review of Systems  Constitutional:  Negative for chills, diaphoresis, fever, malaise/fatigue and weight loss.  HENT:  Negative for nosebleeds and sore throat.   Eyes:  Negative for double vision.  Respiratory:  Negative for cough, hemoptysis, sputum production, shortness of breath and wheezing.   Cardiovascular:  Negative for chest pain, palpitations, orthopnea and leg swelling.  Gastrointestinal:  Negative for abdominal pain, blood in stool, constipation, diarrhea, heartburn, melena, nausea and vomiting.  Genitourinary:  Negative for dysuria, frequency and urgency.  Musculoskeletal:  Negative for back pain and joint pain.  Skin: Negative.  Negative for itching and rash.  Neurological:  Negative for dizziness, tingling, focal weakness, weakness and headaches.  Endo/Heme/Allergies:  Does not bruise/bleed easily.  Psychiatric/Behavioral:  Negative for depression. The patient is not nervous/anxious and does not have insomnia.      MEDICAL HISTORY:  Past Medical History:  Diagnosis Date   Anxiety    self reported   Asthma    Cellulitis, toe 11/18/2021   Left great toe  No abscess    Depression    controlled    Diabetes mellitus without complication (HCC)    Hyperlipidemia    controlled with medication   IBS (irritable bowel syndrome)    Left facial numbness 02/01/2023   Left lower quadrant abdominal pain 04/07/2023   Left shoulder pain 08/02/2018   Muscle cramps    Precordial pain 09/29/2014   Recurrent strokes (HCC)    Slurred speech    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   Stroke (HCC) 03/13/2015   Tension headache 11/18/2020   Viral upper respiratory tract infection 05/07/2022   Word finding difficulty 02/27/2018    SURGICAL HISTORY: Past Surgical History:  Procedure Laterality Date   ABDOMINAL HYSTERECTOMY  10/2006   bowel reconstruction  10/2006   with hysterectomy   CESAREAN SECTION  2005   EP IMPLANTABLE DEVICE N/A 03/17/2015   Procedure: Loop Recorder Insertion;  Surgeon: Will Gladis Norton, MD;  Location: [facility];  Service: Cardiovascular;  Laterality: N/A;   TEE WITHOUT CARDIOVERSION N/A 03/17/2015   Procedure: TRANSESOPHAGEAL ECHOCARDIOGRAM (TEE);  Surgeon: Annabella Scarce, MD;  Location: [facility] ENDOSCOPY;  Service: Cardiovascular;  Laterality: N/A;   TEE WITHOUT CARDIOVERSION N/A 01/06/2021   Procedure: TRANSESOPHAGEAL ECHOCARDIOGRAM (TEE);  Surgeon: Mady Lonni, MD;  Location: [facility] ORS;  Service: Cardiovascular;  Laterality: N/A;   TOE SURGERY     Left 2nd metatarsal    SOCIAL HISTORY: Social History   Socioeconomic History   Marital status: Single    Spouse name: Not on file   Number of children: Not on file   Years of education: Not on file   Highest education level: Not on file  Occupational History   Not on file  Tobacco Use   Smoking status:  Never   Smokeless tobacco: Never  Vaping Use   Vaping status: Never Used  Substance and Sexual Activity   Alcohol use: Not Currently    Alcohol/week: 7.0 standard drinks of alcohol    Types: 7 Glasses of wine per week    Comment: red wine occasionally but not since stroke   Drug use: Not Currently     Types: Marijuana    Comment: edibles-twice weekly   Sexual activity: Not Currently  Other Topics Concern   Not on file  Social History Narrative   Originally from [place]    Family lives up here in [place]    Has one daughter.   Enjoys spending time shopping and spending time with family    Social Drivers of Corporate investment banker Strain: Not on file  Food Insecurity: No Food Insecurity (05/06/2023)   Hunger Vital Sign    Worried About Running Out of Food in the Last Year: Never true    Ran Out of Food in the Last Year: Never true  Transportation Needs: No Transportation Needs (05/06/2023)   PRAPARE - Administrator, Civil Service (Medical): No    Lack of Transportation (Non-Medical): No  Physical Activity: Not on file  Stress: Not on file  Social Connections: Not on file  Intimate Partner Violence: Not At Risk (05/06/2023)   Humiliation, Afraid, Rape, and Kick questionnaire    Fear of Current or Ex-Partner: No    Emotionally Abused: No    Physically Abused: No    Sexually Abused: No    FAMILY HISTORY: Family History  Problem Relation Age of Onset   Hyperlipidemia Mother    Heart failure Father    Arthritis Father    Diabetes Father    Hypertension Father    Hyperlipidemia Father    Prostate cancer Father    Stroke Father    Heart attack Brother 56   Prostate cancer Maternal Uncle        x 3   Heart attack Maternal Grandfather    Hyperlipidemia Maternal Grandfather    Prostate cancer Maternal Grandfather    Diabetes Paternal Grandmother    Stroke Paternal Grandmother    Diabetes Paternal Grandfather    Heart attack Paternal Grandfather    Colon cancer Neg Hx    Colon polyps Neg Hx    Stomach cancer Neg Hx    Esophageal cancer Neg Hx    Pancreatic cancer Neg Hx     ALLERGIES:  is allergic to cephalexin.  MEDICATIONS:  Current Outpatient Medications  Medication Sig Dispense Refill   albuterol  (VENTOLIN  HFA) 108 (90 Base) MCG/ACT  inhaler Inhale 2 puffs into the lungs every 4 (four) hours as needed for wheezing or shortness of breath (cough, shortness of breath or wheezing.). 1 each 1   Ascorbic Acid  (VITAMIN C ) 1000 MG tablet Take 1 tablet (1,000 mg total) by mouth daily. 30 tablet 0   azelastine  (ASTELIN ) 0.1 % nasal spray Place 1 spray into both nostrils 2 (two) times daily. Use in each nostril as directed 30 mL 0   cholecalciferol  (VITAMIN D3) 25 MCG (1000 UNIT) tablet Take 1 tablet (1,000 Units total) by mouth daily. 30 tablet 0   clopidogrel  (PLAVIX ) 75 MG tablet Take 1 tablet by mouth once daily 90 tablet 2   cyclobenzaprine  (FLEXERIL ) 10 MG tablet Take 1 tablet (10 mg total) by mouth 3 (three) times daily as needed for muscle spasms. 30 tablet 0   ezetimibe  (ZETIA ) 10  MG tablet TAKE 1 TABLET BY MOUTH ONCE DAILY FOR CHOLESTEROL 90 tablet 0   fluticasone  (FLONASE ) 50 MCG/ACT nasal spray Place 1 spray into both nostrils 2 (two) times daily. 16 g 0   fluticasone  (FLOVENT  HFA) 110 MCG/ACT inhaler Inhale 2 puffs into the lungs 2 (two) times daily. 1 each 12   Multiple Vitamins-Minerals (MULTIVITAMIN WITH MINERALS) tablet Take 1 tablet by mouth daily.     pantoprazole  (PROTONIX ) 20 MG tablet TAKE 1 TABLET BY MOUTH TWICE DAILY BEFORE A MEAL FOR  HEARTBURN 180 tablet 0   rosuvastatin  (CRESTOR ) 20 MG tablet Take 1 tablet (20 mg total) by mouth daily. for cholesterol. 90 tablet 3   sertraline  (ZOLOFT ) 100 MG tablet Take by mouth.     topiramate  (TOPAMAX ) 25 MG tablet TAKE 1 TABLET BY MOUTH ONCE DAILY FOR  HEADACHE  PREVENTION 90 tablet 0   tirzepatide  (MOUNJARO ) 2.5 MG/0.5ML Pen Inject 2.5 mg into the skin once a week. for diabetes. (Patient not taking: Reported on 02/07/2024) 6 mL 0   No current facility-administered medications for this visit.      PHYSICAL EXAMINATION:  Vitals:   02/07/24 1516  BP: (!) 132/92  Pulse: 77  Resp: 20  Temp: 98.6 F (37 C)  SpO2: 100%   Filed Weights   02/07/24 1516  Weight: 165 lb  6.4 oz (75 kg)    Physical Exam Vitals and nursing note reviewed.  HENT:     Head: Normocephalic and atraumatic.     Mouth/Throat:     Pharynx: Oropharynx is clear.  Eyes:     Extraocular Movements: Extraocular movements intact.     Pupils: Pupils are equal, round, and reactive to light.  Cardiovascular:     Rate and Rhythm: Normal rate and regular rhythm.  Pulmonary:     Comments: Decreased breath sounds bilaterally.  Abdominal:     Palpations: Abdomen is soft.  Musculoskeletal:        General: Normal range of motion.     Cervical back: Normal range of motion.  Skin:    General: Skin is warm.  Neurological:     General: No focal deficit present.     Mental Status: She is alert and oriented to person, place, and time.  Psychiatric:        Behavior: Behavior normal.        Judgment: Judgment normal.      LABORATORY DATA:  I have reviewed the data as listed Lab Results  Component Value Date   WBC 4.3 02/07/2024   HGB 14.2 02/07/2024   HCT 45.6 02/07/2024   MCV 90.7 02/07/2024   PLT 218 02/07/2024   Recent Labs    04/27/23 0831 08/08/23 1526 11/17/23 1042 02/07/24 1507  NA  --  138 141 137  K  --  3.4* 4.1 4.2  CL  --  106 106 104  CO2  --  24 30 26   GLUCOSE  --  80 92 96  BUN  --  17 15 11   CREATININE  --  1.02* 1.00 1.03*  CALCIUM   --  9.0 9.3 9.2  GFRNONAA  --  >60  --  >60  PROT 6.6 7.6  --  7.2  ALBUMIN 4.1 4.3  --  4.1  AST 23 22  --  24  ALT 32 26  --  33  ALKPHOS 70 65  --  71  BILITOT 0.5 0.4  --  0.6  BILIDIR 0.1  --   --   --  RADIOGRAPHIC STUDIES: I have personally reviewed the radiological images as listed and agreed with the findings in the report. CUP PACEART REMOTE DEVICE CHECK Result Date: 01/17/2024 ILR summary report received. Battery status OK. Normal device function. No new symptom, tachy, brady, or pause episodes. No new AF episodes. Monthly summary reports and ROV/PRN LA, CVRS   ASSESSMENT & PLAN:   Leucopenia #  Leukopenia- no differential; 3.2  normal hemoglobin/platelets. Patient is asymptomatic-no increased risk of infections. Suspect benign causes rather than any malignant causes. 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 hold bone marrow biopsy for now.   # Elevated blood pressure-140-160-n systolic.  Recommend check blood pressure at home and also follow-up with PCP.  #  Stroke- multiple of unclear etiology [2016-first] - on plavix / Topomax [Dr.Sethi; [facility]]  # Obesity: on Mujauro.   #Since patient is clinically stable I think is reasonable for the patient to follow-up with PCP/can follow-up with us  as needed.  Patient comfortable with the plan; to call us  if any questions or concerns in the interim.   # DISPOSITION: # repeat BP  # follow up as needed;  -Dr.B  Cc; Mallie Gaskins, PCP      All questions were answered. The patient knows to call the clinic with any problems, questions or concerns.      Amber JONELLE Joe, MD 02/07/2024 3:57 PM

## 2024-02-08 NOTE — Telephone Encounter (Signed)
 Patient needs an office visit. Please schedule.

## 2024-02-12 ENCOUNTER — Other Ambulatory Visit: Payer: Self-pay | Admitting: Primary Care

## 2024-02-12 DIAGNOSIS — R519 Headache, unspecified: Secondary | ICD-10-CM

## 2024-02-12 NOTE — Telephone Encounter (Signed)
 Patient is due for diabetes follow up in December, this will be required prior to any further refills.  Please schedule, thank you!

## 2024-02-13 NOTE — Telephone Encounter (Signed)
 Called  pt and schedule a appt

## 2024-02-13 NOTE — Telephone Encounter (Signed)
reschedule appt

## 2024-02-14 DIAGNOSIS — M9901 Segmental and somatic dysfunction of cervical region: Secondary | ICD-10-CM | POA: Diagnosis not present

## 2024-02-14 DIAGNOSIS — M9903 Segmental and somatic dysfunction of lumbar region: Secondary | ICD-10-CM | POA: Diagnosis not present

## 2024-02-14 DIAGNOSIS — M9904 Segmental and somatic dysfunction of sacral region: Secondary | ICD-10-CM | POA: Diagnosis not present

## 2024-02-14 DIAGNOSIS — M9902 Segmental and somatic dysfunction of thoracic region: Secondary | ICD-10-CM | POA: Diagnosis not present

## 2024-02-15 ENCOUNTER — Other Ambulatory Visit (INDEPENDENT_AMBULATORY_CARE_PROVIDER_SITE_OTHER)

## 2024-02-15 DIAGNOSIS — E1169 Type 2 diabetes mellitus with other specified complication: Secondary | ICD-10-CM

## 2024-02-15 DIAGNOSIS — E785 Hyperlipidemia, unspecified: Secondary | ICD-10-CM

## 2024-02-15 LAB — LIPID PANEL
Cholesterol: 170 mg/dL (ref 0–200)
HDL: 79.2 mg/dL (ref >39.00)
LDL Cholesterol: 78 mg/dL (ref 0–99)
NonHDL: 90.85
Total CHOL/HDL Ratio: 2
Triglycerides: 62 mg/dL (ref 0.0–149.0)
VLDL: 12.4 mg/dL (ref 0.0–40.0)

## 2024-02-16 ENCOUNTER — Ambulatory Visit: Payer: Self-pay | Admitting: Primary Care

## 2024-02-16 ENCOUNTER — Ambulatory Visit (INDEPENDENT_AMBULATORY_CARE_PROVIDER_SITE_OTHER)

## 2024-02-16 DIAGNOSIS — E1169 Type 2 diabetes mellitus with other specified complication: Secondary | ICD-10-CM

## 2024-02-16 DIAGNOSIS — I639 Cerebral infarction, unspecified: Secondary | ICD-10-CM | POA: Diagnosis not present

## 2024-02-16 LAB — CUP PACEART REMOTE DEVICE CHECK
Date Time Interrogation Session: 20250916233705
Implantable Pulse Generator Implant Date: 20250128

## 2024-02-17 ENCOUNTER — Other Ambulatory Visit: Payer: Self-pay | Admitting: Primary Care

## 2024-02-17 DIAGNOSIS — E785 Hyperlipidemia, unspecified: Secondary | ICD-10-CM

## 2024-02-18 MED ORDER — ROSUVASTATIN CALCIUM 40 MG PO TABS: 40.0000 mg | ORAL_TABLET | Freq: Every day | ORAL | 2 refills | Status: DC

## 2024-02-19 ENCOUNTER — Ambulatory Visit: Payer: Self-pay | Admitting: Cardiology

## 2024-02-21 ENCOUNTER — Other Ambulatory Visit: Payer: Self-pay | Admitting: Primary Care

## 2024-02-21 DIAGNOSIS — K219 Gastro-esophageal reflux disease without esophagitis: Secondary | ICD-10-CM

## 2024-02-21 NOTE — Progress Notes (Signed)
 Remote Loop Recorder Transmission

## 2024-02-22 NOTE — Progress Notes (Signed)
 Remote Loop Recorder Transmission

## 2024-02-28 DIAGNOSIS — M9901 Segmental and somatic dysfunction of cervical region: Secondary | ICD-10-CM | POA: Diagnosis not present

## 2024-02-28 DIAGNOSIS — M9902 Segmental and somatic dysfunction of thoracic region: Secondary | ICD-10-CM | POA: Diagnosis not present

## 2024-02-28 DIAGNOSIS — M9903 Segmental and somatic dysfunction of lumbar region: Secondary | ICD-10-CM | POA: Diagnosis not present

## 2024-02-28 DIAGNOSIS — M9904 Segmental and somatic dysfunction of sacral region: Secondary | ICD-10-CM | POA: Diagnosis not present

## 2024-03-06 NOTE — Progress Notes (Signed)
 Remote Loop Recorder Transmission

## 2024-03-14 ENCOUNTER — Ambulatory Visit: Admitting: Primary Care

## 2024-03-14 ENCOUNTER — Encounter: Payer: Self-pay | Admitting: Primary Care

## 2024-03-14 VITALS — BP 126/80 | HR 71 | Temp 97.2°F | Ht 62.0 in | Wt 166.0 lb

## 2024-03-14 DIAGNOSIS — Z23 Encounter for immunization: Secondary | ICD-10-CM

## 2024-03-14 DIAGNOSIS — E785 Hyperlipidemia, unspecified: Secondary | ICD-10-CM | POA: Diagnosis not present

## 2024-03-14 DIAGNOSIS — Z8673 Personal history of transient ischemic attack (TIA), and cerebral infarction without residual deficits: Secondary | ICD-10-CM | POA: Diagnosis not present

## 2024-03-14 MED ORDER — REPATHA SURECLICK 140 MG/ML ~~LOC~~ SOAJ: 140.0000 mg | SUBCUTANEOUS | 1 refills | Status: AC

## 2024-03-14 NOTE — Patient Instructions (Signed)
 Start Repatha injections for high cholesterol. Inject 140 mg into the skin every 14 days.  Schedule a follow-up visit in 2 months for diabetes check and cholesterol check.  It was a pleasure to see you today!

## 2024-03-14 NOTE — Assessment & Plan Note (Signed)
 Agree that a stressful masters program within a short timeframe would not be advisable given her medical history. Will provide note and attach to MyChart portal.

## 2024-03-14 NOTE — Progress Notes (Signed)
 Subjective:    Patient ID: Amber [name], female    DOB: 02-24-1971, 53 y.o.   MRN: [id number]  Amber [name] is a very pleasant 53 y.o. female with a history of hypertension, recurrent strokes, hyperlipidemia, type 2 diabetes who presents today for follow-up of hyperlipidemia. She is also needing a work note.  Currently managed on rosuvastatin  20 mg daily and Zetia  10 mg daily. Her most recent LDL was 78 which is still above goal of LDL less than 70.  When increasing rosuvastatin  to 40 mg she experiences side effects of myalgias.  She has previously failed atorvastatin  up to 80 mg daily.  She has not tried Repatha but she is open to trying. 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  She has been her role for 5 years and her employer is requesting that she return to school for her masters degree.  She is reticent about doing so given her history of recurrent strokes and that she would only have 3 years to complete the program.  Her brain processing is slower than prior to her strokes.  If she were allotted 7 years to complete the program she would feel more comfortable.   BP Readings from Last 3 Encounters:  03/14/24 126/80  02/07/24 (!) 132/92  11/17/23 102/70     Review of Systems  Respiratory:  Negative for shortness of breath.   Cardiovascular:  Negative for chest pain.  Neurological:  Negative for dizziness and headaches.         Past Medical History:  Diagnosis Date   Anxiety    self reported   Asthma    Cellulitis, toe 11/18/2021   Left great toe  No abscess    Depression    controlled   Diabetes mellitus without complication (HCC)    Hyperlipidemia    controlled with medication   IBS (irritable bowel syndrome)    Left facial numbness 02/01/2023   Left lower quadrant abdominal pain 04/07/2023   Left shoulder pain 08/02/2018   Muscle cramps    Precordial pain  09/29/2014   Recurrent strokes (HCC)    Slurred speech    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   Stroke (HCC) 03/13/2015   Tension headache 11/18/2020   Viral upper respiratory tract infection 05/07/2022   Word finding difficulty 02/27/2018    Social History   Socioeconomic History   Marital status: Single    Spouse name: Not on file   Number of children: Not on file   Years of education: Not on file   Highest education level: Not on file  Occupational History   Not on file  Tobacco Use   Smoking status: Never   Smokeless tobacco: Never  Vaping Use   Vaping status: Never Used  Substance and Sexual Activity   Alcohol use: Not Currently    Alcohol/week: 7.0 standard drinks of alcohol    Types: 7 Glasses of wine per week    Comment: red wine occasionally but not since stroke   Drug use: Not Currently    Types: Marijuana    Comment: edibles-twice weekly   Sexual activity: Not Currently  Other Topics Concern   Not on file  Social History Narrative   Originally from  [place]    Family lives up here in [place]    Has one daughter.   Enjoys spending time shopping and spending time with family  Social Drivers of Corporate investment banker Strain: Not on file  Food Insecurity: No Food Insecurity (05/06/2023)   Hunger Vital Sign    Worried About Running Out of Food in the Last Year: Never true    Ran Out of Food in the Last Year: Never true  Transportation Needs: No Transportation Needs (05/06/2023)   PRAPARE - Administrator, Civil Service (Medical): No    Lack of Transportation (Non-Medical): No  Physical Activity: Not on file  Stress: Not on file  Social Connections: Not on file  Intimate Partner Violence: Not At Risk (05/06/2023)   Humiliation, Afraid, Rape, and Kick questionnaire    Fear of Current or Ex-Partner: No    Emotionally Abused: No    Physically Abused: No    Sexually Abused: No    Past Surgical History:  Procedure Laterality  Date   ABDOMINAL HYSTERECTOMY  10/2006   bowel reconstruction  10/2006   with hysterectomy   CESAREAN SECTION  2005   EP IMPLANTABLE DEVICE N/A 03/17/2015   Procedure: Loop Recorder Insertion;  Surgeon: Will Gladis Norton, MD;  Location: [facility] INVASIVE CV LAB;  Service: Cardiovascular;  Laterality: N/A;   TEE WITHOUT CARDIOVERSION N/A 03/17/2015   Procedure: TRANSESOPHAGEAL ECHOCARDIOGRAM (TEE);  Surgeon: Annabella Scarce, MD;  Location: [facility] ENDOSCOPY;  Service: Cardiovascular;  Laterality: N/A;   TEE WITHOUT CARDIOVERSION N/A 01/06/2021   Procedure: TRANSESOPHAGEAL ECHOCARDIOGRAM (TEE);  Surgeon: Mady Bruckner, MD;  Location: [facility] ORS;  Service: Cardiovascular;  Laterality: N/A;   TOE SURGERY     Left 2nd metatarsal    Family History  Problem Relation Age of Onset   Hyperlipidemia Mother    Heart failure Father    Arthritis Father    Diabetes Father    Hypertension Father    Hyperlipidemia Father    Prostate cancer Father    Stroke Father    Heart attack Brother 69   Prostate cancer Maternal Uncle        x 3   Heart attack Maternal Grandfather    Hyperlipidemia Maternal Grandfather    Prostate cancer Maternal Grandfather    Diabetes Paternal Grandmother    Stroke Paternal Grandmother    Diabetes Paternal Grandfather    Heart attack Paternal Grandfather    Colon cancer Neg Hx    Colon polyps Neg Hx    Stomach cancer Neg Hx    Esophageal cancer Neg Hx    Pancreatic cancer Neg Hx     Allergies  Allergen Reactions   Cephalexin Hives and Rash    Can take Augmentin     Current Outpatient Medications on File Prior to Visit  Medication Sig Dispense Refill   albuterol  (VENTOLIN  HFA) 108 (90 Base) MCG/ACT inhaler Inhale 2 puffs into the lungs every 4 (four) hours as needed for wheezing or shortness of breath (cough, shortness of breath or wheezing.). 1 each 1   Ascorbic Acid  (VITAMIN C ) 1000 MG tablet Take 1 tablet (1,000 mg total) by mouth daily. 30 tablet 0   azelastine   (ASTELIN ) 0.1 % nasal spray Place 1 spray into both nostrils 2 (two) times daily. Use in each nostril as directed 30 mL 0   cholecalciferol  (VITAMIN D3) 25 MCG (1000 UNIT) tablet Take 1 tablet (1,000 Units total) by mouth daily. 30 tablet 0   clopidogrel  (PLAVIX ) 75 MG tablet Take 1 tablet by mouth once daily 90 tablet 2   cyclobenzaprine  (FLEXERIL ) 10 MG tablet Take 1 tablet (10 mg total) by mouth  3 (three) times daily as needed for muscle spasms. 30 tablet 0   ezetimibe  (ZETIA ) 10 MG tablet TAKE 1 TABLET BY MOUTH ONCE DAILY FOR CHOLESTEROL 90 tablet 2   fluticasone  (FLONASE ) 50 MCG/ACT nasal spray Place 1 spray into both nostrils 2 (two) times daily. 16 g 0   fluticasone  (FLOVENT  HFA) 110 MCG/ACT inhaler Inhale 2 puffs into the lungs 2 (two) times daily. 1 each 12   Multiple Vitamins-Minerals (MULTIVITAMIN WITH MINERALS) tablet Take 1 tablet by mouth daily.     pantoprazole  (PROTONIX ) 20 MG tablet TAKE 1 TABLET BY MOUTH TWICE DAILY BEFORE MEAL(S) FOR HEARTBURN 180 tablet 1   rosuvastatin  (CRESTOR ) 40 MG tablet Take 1 tablet (40 mg total) by mouth daily. For cholesterol 90 tablet 2   sertraline  (ZOLOFT ) 100 MG tablet Take by mouth.     topiramate  (TOPAMAX ) 25 MG tablet TAKE 1 TABLET BY MOUTH ONCE DAILY FOR  HEADACHE  PREVENTION 90 tablet 0   tirzepatide  (MOUNJARO ) 2.5 MG/0.5ML Pen Inject 2.5 mg into the skin once a week. for diabetes. (Patient not taking: Reported on 03/14/2024) 6 mL 0   No current facility-administered medications on file prior to visit.    BP 126/80   Pulse 71   Temp (!) 97.2 F (36.2 C) (Temporal)   Ht 5' 2 (1.575 m)   Wt 166 lb (75.3 kg)   SpO2 98%   BMI 30.36 kg/m  Objective:   Physical Exam Cardiovascular:     Rate and Rhythm: Normal rate and regular rhythm.  Pulmonary:     Effort: Pulmonary effort is normal.     Breath sounds: Normal breath sounds.  Musculoskeletal:     Cervical back: Neck supple.  Skin:    General: Skin is warm and dry.  Neurological:      Mental Status: She is alert and oriented to person, place, and time.  Psychiatric:        Mood and Affect: Mood normal.     Physical Exam        Assessment & Plan:  Hyperlipidemia, unspecified hyperlipidemia type Assessment & Plan: LDL above goal despite management on rosuvastatin  20 mg and Zetia  10 mg as noted in HPI. As she cannot tolerate statins and higher doses we will start Repatha  Start Repatha 140 mg every 14 days. Continue rosuvastatin  and Zetia  for now. Will consult with cardiology regarding discontinuation.  Will plan to see her back in 2 months for cholesterol check.  Orders: -     Repatha SureClick; Inject 140 mg into the skin every 14 (fourteen) days. For cholesterol.  Dispense: 6 mL; Refill: 1  History of CVA (cerebrovascular accident) Assessment & Plan: Agree that a stressful masters program within a short timeframe would not be advisable given her medical history. Will provide note and attach to MyChart portal.  Orders: -     Repatha SureClick; Inject 140 mg into the skin every 14 (fourteen) days. For cholesterol.  Dispense: 6 mL; Refill: 1    Assessment and Plan Assessment & Plan        Comer MARLA Gaskins, NP    History of Present Illness

## 2024-03-14 NOTE — Assessment & Plan Note (Signed)
 LDL above goal despite management on rosuvastatin  20 mg and Zetia  10 mg as noted in HPI. As she cannot tolerate statins and higher doses we will start Repatha  Start Repatha 140 mg every 14 days. Continue rosuvastatin  and Zetia  for now. Will consult with cardiology regarding discontinuation.  Will plan to see her back in 2 months for cholesterol check.

## 2024-03-19 ENCOUNTER — Encounter

## 2024-03-19 ENCOUNTER — Ambulatory Visit: Attending: Cardiology

## 2024-03-19 DIAGNOSIS — I639 Cerebral infarction, unspecified: Secondary | ICD-10-CM

## 2024-03-20 LAB — CUP PACEART REMOTE DEVICE CHECK
Date Time Interrogation Session: 20251019234239
Implantable Pulse Generator Implant Date: 20250128

## 2024-03-21 ENCOUNTER — Encounter: Payer: Self-pay | Admitting: Medical

## 2024-03-21 ENCOUNTER — Telehealth: Payer: Self-pay

## 2024-03-21 ENCOUNTER — Ambulatory Visit: Attending: Medical | Admitting: Medical

## 2024-03-21 ENCOUNTER — Other Ambulatory Visit (HOSPITAL_COMMUNITY): Payer: Self-pay

## 2024-03-21 VITALS — BP 136/92 | HR 76 | Ht 62.0 in | Wt 166.4 lb

## 2024-03-21 DIAGNOSIS — E782 Mixed hyperlipidemia: Secondary | ICD-10-CM | POA: Diagnosis not present

## 2024-03-21 DIAGNOSIS — I639 Cerebral infarction, unspecified: Secondary | ICD-10-CM | POA: Diagnosis not present

## 2024-03-21 DIAGNOSIS — Z95818 Presence of other cardiac implants and grafts: Secondary | ICD-10-CM

## 2024-03-21 NOTE — Patient Instructions (Signed)
 Medication Instructions:  Your physician recommends that you continue on your current medications as directed. Please refer to the Current Medication list given to you today.   *If you need a refill on your cardiac medications before your next appointment, please call your pharmacy*  Lab Work: No labs ordered today  If you have labs (blood work) drawn today and your tests are completely normal, you will receive your results only by: MyChart Message (if you have MyChart) OR A paper copy in the mail If you have any lab test that is abnormal or we need to change your treatment, we will call you to review the results.  Testing/Procedures: No test ordered today   Follow-Up: At [facility], you and your health needs are our priority.  As part of our continuing mission to provide you with exceptional heart care, our providers are all part of one team.  This team includes your primary Cardiologist (physician) and Advanced Practice Providers or APPs (Physician Assistants and Nurse Practitioners) who all work together to provide you with the care you need, when you need it.  Your next appointment:   12 month(s) Schedule with EP in 6 mo  Provider:   You may see Lonni Hanson, MD or one of the following Advanced Practice Providers on your designated Care Team:   Cadence Hana, PA-C

## 2024-03-21 NOTE — Progress Notes (Signed)
 Cardiology Office Note   Date:  03/21/2024  ID:  Amber [name], Amber [name] [birth year], MRN [id number] PCP: Gretta Comer POUR, NP  [facility] Providers Cardiologist:  Lonni Hanson, MD Electrophysiologist:  Fonda Kitty, MD   History of Present Illness Amber [name] is a 53 y.o. female with a hx of recurrent strokes/TIAs, diabetes type 2, hyperlipidemia, COVID in 2022, asthma, IBS, anxiety who presents for follow-up.   Patient was admitted in 2016 with left MCA infarct with an embolic pattern secondary to unknown source.  Echo at that time showed EF of 50%, no wall motion abnormalities, grade 1 diastolic dysfunction, mild MR.  TEE showed EF of 45 to 50%, diffuse global hypokinesis, no PFO or ASD.  She subsequently underwent ILR implantation.  Device interrogation showed no evidence of arrhythmia.  She was readmitted in 2018 with CVA with echo at that time showing EF of 55 to 60%, normal wall motion, normal LV diastolic function, no PFO.  Admitted in August 2022 with recurrent stroke.  Repeat TEE showed no intracardiac thrombus or stent.  Linear echodensity in the ascending aorta was noted and felt to be most likely artifact.  Subsequent CT of the chest showed no significant abnormality.  She reestablish care with cardiology in September 2022 for evaluation of chest pain.  She had been seen in the ER recently but left without being seen due to prolonged wait.  Subsequent cardiac CTA showed calcium  score of 0 with no CAD.  She was referred to EP for consideration of repeat ILR implantation.  Patient was wanting to resume work and deferred implantation of ILR.  Outpatient cardiac monitoring x 2 showed predominantly normal sinus rhythm, rare PACs, PVCs, no significant arrhythmia.  No evidence of A-fib or flutter.  The patient was last seen January 2025 by EP.  Loop recorder reached end-of-life and it was decided to replace the device.  Today, the patient is overall doing well. It's been 9  years since her first stroke. She has mild depression and takes medication for it. She is wanting to lose weight, down to 145 or 150lbs. Cholesterol is still a little high.she denies chest pain, SOB, lower leg edema, lightheadedness, dizziness, palpitations. She reports diet needs to be better. She does no formal activity, but walks at work.    Studies Reviewed      2D echo 03/14/2015: - Left ventricle: The cavity size was normal. Systolic function was    normal. The estimated ejection fraction was 50%. Wall motion was    normal; there were no regional wall motion abnormalities. Doppler    parameters are consistent with abnormal left ventricular    relaxation (grade 1 diastolic dysfunction). Doppler parameters    are consistent with elevated ventricular end-diastolic filling    pressure.  - Aortic valve: Structurally normal valve. There was no    regurgitation.  - Mitral valve: Structurally normal valve. There was mild    regurgitation.  - Left atrium: The atrium was normal in size.  - Right ventricle: The cavity size was normal. Wall thickness was    normal. Systolic function was normal.  - Right atrium: The atrium was normal in size.  - Tricuspid valve: There was mild regurgitation.  - Pulmonic valve: There was trivial regurgitation.  - Inferior vena cava: The vessel was normal in size. The    respirophasic diameter changes were in the normal range (>= 50%),    consistent with normal central venous pressure.  - Pericardium, extracardiac: The pericardium was  normal in    appearance.   Impressions:   - LVEF is mildly impaired with diffuse hypokinesis.    Abnormal relaxation with mildly elevated filling pressures.    Mild MR and TR. __________   Transcranial doppler with bubbles 03/14/2015: Negative TCD Bubble study.No high intensity transient signals  (HITS) heard at rest or with valsalva. Therefore, there is no  apparent PFO. __________   TEE 03/17/2015: - Left ventricle:  Systolic function was mildly reduced. The    estimated ejection fraction was in the range of 45% to 50%.    Diffuse hypokinesis.  - Left atrium: No evidence of thrombus in the atrial cavity or    appendage.  - Right atrium: No evidence of thrombus in the atrial cavity or    appendage.  - Atrial septum: No defect or patent foramen ovale was identified.    Echo contrast study showed no right-to-left atrial level shunt,    at baseline or with provocation. Negative saline microcavitation    study. ___________   2D echo 06/11/2016: - Left ventricle: The cavity size was normal. Wall thickness was    normal. Systolic function was normal. The estimated ejection    fraction was in the range of 55% to 60%. Wall motion was normal;    there were no regional wall motion abnormalities. Left    ventricular diastolic function parameters were normal.  - Atrial septum: No defect or patent foramen ovale was identified.   Impressions:   - No cardiac source of emboli was indentified. __________   2D echo 01/04/2021: 1. Left ventricular ejection fraction, by estimation, is 55 to 60%. The  left ventricle has normal function. The left ventricle has no regional  wall motion abnormalities. Left ventricular diastolic parameters are  consistent with Grade I diastolic  dysfunction (impaired relaxation).   2. Right ventricular systolic function is normal. The right ventricular  size is normal.   3. The mitral valve is degenerative. Mild mitral valve regurgitation.   4. The aortic valve is tricuspid. Aortic valve regurgitation is not  visualized.   5. Agitated saline contrast bubble study was negative, with no evidence  of any interatrial shunt. __________   TEE 01/06/2021: 1. Left ventricular ejection fraction, by estimation, is 55 to 60%. The  left ventricle has normal function.   2. Right ventricular systolic function is normal. The right ventricular  size is normal.   3. No left atrial/left atrial  appendage thrombus was detected. The LAA  emptying velocity was 76 cm/s.   4. The mitral valve is abnormal. Mild mitral valve regurgitation.   5. The aortic valve is tricuspid. Aortic valve regurgitation is not  visualized.   6. There is a linear echodensity in and adjacent to the ascending aorta  that most likely represents artifact. However, given cryptogenic strokes,  further evaluation with CTA could be helpful to exclude aortic pathology.   7. Agitated saline contrast bubble study was negative, with no evidence  of any interatrial shunt. __________   Coronary artery CTA 02/12/2021: FINDINGS: Aorta:  Normal size.  No calcifications.  No dissection.   Aortic Valve:  Trileaflet.  No calcifications.   Coronary Arteries:  Normal coronary origin.  Right dominance.   RCA is a dominant artery that gives rise to PDA and PLA. There is no plaque.   Left main is a large artery that gives rise to LAD and LCX arteries.   LAD has no plaque.   LCX is a non-dominant artery that  gives rise to two obtuse marginal branches. There is no plaque.   Other findings:   Normal pulmonary vein drainage into the left atrium.   Normal left atrial appendage without a thrombus.   Normal size of the pulmonary artery.   IMPRESSION: 1. Normal coronary calcium  score of 0. Patient is low risk for coronary events. 2. Normal coronary origin with right dominance. 3. No evidence of CAD. 4. CAD-RADS 0. Consider non-atherosclerotic causes of chest pain. __________   Zio patch 05/2021: The patient was monitored for 14 days. The predominant rhythm was sinus with an average rate of 87 bpm (range 52-161 bpm). There were rare PACs and PVCs. No sustained arrhythmia or prolonged pause was identified. Single patient triggered event corresponds to normal sinus rhythm.   Predominantly sinus rhythm with rare PACs and PVCs.  No significant arrhythmia identified. __________   Zio patch 05/2021: The patient was  monitored for 13 days, 9 hours. The predominant rhythm was sinus with an average rate of 90 bpm (range 51-184 bpm in sinus). There were rare PACs and PVCs. Single atrial run lasting 5 beats occurred with a maximum rate of 193 bpm. No sustained arrhythmia or prolonged pause was observed. Patient triggered event corresponds to sinus rhythm with artifact and PVC.   Predominantly sinus rhythm with rare PACs and PVCs as well as single brief episode of PSVT.  No atrial fibrillation/flutter observed.      Physical Exam VS:  BP (!) 136/92 (BP Location: Left Arm, Patient Position: Sitting, Cuff Size: Normal)   Pulse 76   Ht 5' 2 (1.575 m)   Wt 166 lb 6.4 oz (75.5 kg)   SpO2 96%   BMI 30.43 kg/m        Wt Readings from Last 3 Encounters:  03/21/24 166 lb 6.4 oz (75.5 kg)  03/14/24 166 lb (75.3 kg)  02/07/24 165 lb 6.4 oz (75 kg)    GEN: Well nourished, well developed in no acute distress NECK: No JVD; No carotid bruits CARDIAC: RRR, no murmurs, rubs, gallops RESPIRATORY:  Clear to auscultation without rales, wheezing or rhonchi  ABDOMEN: Soft, non-tender, non-distended EXTREMITIES:  No edema; No deformity   ASSESSMENT AND PLAN  Recurrent strokes/TIAs She reports multiple strokes and TIAs starting 9 years ago. Work-up has been unremarkable so far (including echocardiogram, transcranial Doppler, ILR, heart monitor x 2, carotid ultrasound, oncology workup).  Patient is followed closely by primary care.  Continue Plavix , Zetia , Crestor .  She was recently started on Repatha by PCP.  Patient follows with neurology.  S/p ILR Loop recorder replaced 06/28/23. Most recent device check was normal with no new send from, tacky, bradycardia, or pause episodes, no A-fib episodes.  HLD LDL 78. Continue zetia  10mg  daily and Crestor  40mg  daily. She hasn't started Rpatha yet, she is waiting on insurance approval.  If tolerable, can continue all 3.       Dispo: Follow-up in 1 year with general cards and  6 months with EP  Signed, Jackalynn Art VEAR Fishman, PA-C

## 2024-03-21 NOTE — Telephone Encounter (Signed)
 Pharmacy Patient Advocate Encounter   Received notification from Patient Advice Request messages that prior authorization for Repatha Sureclick 140 is required/requested.   Insurance verification completed.   The patient is insured through [employer].   Per test claim: PA required; PA submitted to above mentioned insurance via Latent Key/confirmation #/EOC [id number] Status is pending

## 2024-03-21 NOTE — Telephone Encounter (Signed)
 Pharmacy Patient Advocate Encounter  Received notification from [employer] that Prior Authorization for Repatha Sureclick 140has been APPROVED from 03/21/24 to 03/21/25. Ran test claim, Copay is $4.00. This test claim was processed through [facility]- copay amounts may vary at other pharmacies due to pharmacy/plan contracts, or as the patient moves through the different stages of their insurance plan.   PA #/Case ID/Reference #: # [id number]

## 2024-03-23 NOTE — Progress Notes (Signed)
 Remote Loop Recorder Transmission

## 2024-03-24 ENCOUNTER — Ambulatory Visit: Payer: Self-pay | Admitting: Cardiology

## 2024-04-07 ENCOUNTER — Other Ambulatory Visit: Payer: Self-pay | Admitting: Primary Care

## 2024-04-07 DIAGNOSIS — R519 Headache, unspecified: Secondary | ICD-10-CM

## 2024-04-16 DIAGNOSIS — F900 Attention-deficit hyperactivity disorder, predominantly inattentive type: Secondary | ICD-10-CM | POA: Diagnosis not present

## 2024-04-16 DIAGNOSIS — F3342 Major depressive disorder, recurrent, in full remission: Secondary | ICD-10-CM | POA: Diagnosis not present

## 2024-04-19 ENCOUNTER — Ambulatory Visit: Attending: Cardiology

## 2024-04-19 DIAGNOSIS — I491 Atrial premature depolarization: Secondary | ICD-10-CM | POA: Diagnosis not present

## 2024-04-19 LAB — CUP PACEART REMOTE DEVICE CHECK
Date Time Interrogation Session: 20251119233427
Implantable Pulse Generator Implant Date: 20250128

## 2024-04-22 ENCOUNTER — Ambulatory Visit: Payer: Self-pay | Admitting: Cardiology

## 2024-04-23 NOTE — Progress Notes (Signed)
 Remote Loop Recorder Transmission

## 2024-05-02 ENCOUNTER — Telehealth: Payer: Self-pay | Admitting: Pharmacy Technician

## 2024-05-02 ENCOUNTER — Other Ambulatory Visit (HOSPITAL_COMMUNITY): Payer: Self-pay

## 2024-05-02 NOTE — Telephone Encounter (Addendum)
 Pharmacy Patient Advocate Encounter   Received notification from Onbase that prior authorization for Mounjaro  2.5MG /0.5ML auto-injectors  is due for renewal.   Insurance verification completed.   The patient is insured through [employer]. Key: [id number]  Action: Medication is now available without a prior authorization.

## 2024-05-04 DIAGNOSIS — F330 Major depressive disorder, recurrent, mild: Secondary | ICD-10-CM | POA: Diagnosis not present

## 2024-05-07 ENCOUNTER — Ambulatory Visit: Payer: Self-pay

## 2024-05-07 NOTE — Telephone Encounter (Signed)
 FYI Only or Action Required?: FYI only for provider: UC advised.  Patient was last seen in primary care on 03/14/2024 by Amber Comer POUR, NP.  Called Nurse Triage reporting Cough.  Symptoms began several days ago.  Interventions attempted: OTC medications: Mucinex  DM and Prescription medications: flovent .  Symptoms are: gradually worsening.  Triage Disposition: See HCP Within 4 Hours (Or PCP Triage)  Patient/caregiver understands and will follow disposition?:   Copied from CRM #[id number]. Topic: Clinical - Red Word Triage >> May 07, 2024  4:05 PM Alexandria E wrote: [facility] that prompted transfer to Nurse Triage: Worsening dry cough, with headaches. Symptoms going on for 4 days.   Reason for Disposition  Wheezing is present  Answer Assessment - Initial Assessment Questions Non productive cough, headache, fatigue and wheeze since Thursday. Mucinex  DM and albuterol  inhaler. No appointment available till Wednesday. Advised UC. 1. ONSET: When did the cough begin?      Thursday 3. SPUTUM: Describe the color of your sputum (e.g., none, dry cough; clear, white, yellow, green)     Dry 4. HEMOPTYSIS: Are you coughing up any blood? If Yes, ask: How much? (e.g., flecks, streaks, tablespoons, etc.)     denies 5. DIFFICULTY BREATHING: Are you having difficulty breathing? If Yes, ask: How bad is it? (e.g., mild, moderate, severe)      mild 6. FEVER: Do you have a fever? If Yes, ask: What is your temperature, how was it measured, and when did it start?     Denies 7. CARDIAC HISTORY: Do you have any history of heart disease? (e.g., heart attack, congestive heart failure)      Stroke 8. LUNG HISTORY: Do you have any history of lung disease?  (e.g., pulmonary embolus, asthma, emphysema)     Asthma 9. PE RISK FACTORS: Do you have a history of blood clots? (or: recent major surgery, recent prolonged travel, bedridden)     Hx stroke 10. OTHER SYMPTOMS: Do you have any  other symptoms? (e.g., runny nose, wheezing, chest pain)   Headaches, fatigue, wheeze  Protocols used: Cough - Acute Non-Productive-A-AH

## 2024-05-08 DIAGNOSIS — J029 Acute pharyngitis, unspecified: Secondary | ICD-10-CM | POA: Diagnosis not present

## 2024-05-08 DIAGNOSIS — R0981 Nasal congestion: Secondary | ICD-10-CM | POA: Diagnosis not present

## 2024-05-08 NOTE — Telephone Encounter (Signed)
 Noted

## 2024-05-13 DIAGNOSIS — J42 Unspecified chronic bronchitis: Secondary | ICD-10-CM | POA: Diagnosis not present

## 2024-05-13 DIAGNOSIS — J159 Unspecified bacterial pneumonia: Secondary | ICD-10-CM | POA: Diagnosis not present

## 2024-05-15 NOTE — Progress Notes (Unsigned)
 Amber [name] T. Keyshia Orwick, MD, CAQ Sports Medicine [facility] [address] [place], [address]  Phone: [phone]  FAX: [phone]  Amber [name] - 53 y.o. female  MRN [id number]  Date of Birth: Oct 28, 1970  Date: 05/16/2024  PCP: Gretta Comer POUR, NP  Referral: Gretta Comer POUR, NP  No chief complaint on file.  Subjective:   Amber [name] is a 53 y.o. very pleasant female patient with There is no height or weight on file to calculate BMI. who presents with the following:  Discussed the use of AI scribe software for clinical note transcription with the patient, who gave verbal consent to proceed.  Patient presents with ongoing cough. History of Present Illness     Review of Systems is noted in the HPI, as appropriate  Objective:   There were no vitals taken for this visit.  GEN: No acute distress; alert,appropriate. PULM: Breathing comfortably in no respiratory distress PSYCH: Normally interactive.   Laboratory and Imaging Data:  Assessment and Plan:   No diagnosis found. Assessment & Plan   Medication Management during today's office visit: No orders of the defined types were placed in this encounter.  There are no discontinued medications.  Orders placed today for conditions managed today: No orders of the defined types were placed in this encounter.   Disposition: No follow-ups on file.  [employer] One speech-to-text software was used for transcription in this dictation.  Possible transcriptional errors can occur using Animal nutritionist.   Signed,  Jacques DASEN. Amber Timothy, MD   Outpatient Encounter Medications as of 05/16/2024  Medication Sig   albuterol  (VENTOLIN  HFA) 108 (90 Base) MCG/ACT inhaler Inhale 2 puffs into the lungs every 4 (four) hours as needed for wheezing or shortness of breath (cough, shortness of breath or wheezing.).   Ascorbic Acid  (VITAMIN C ) 1000 MG tablet Take 1 tablet (1,000 mg  total) by mouth daily.   azelastine  (ASTELIN ) 0.1 % nasal spray Place 1 spray into both nostrils 2 (two) times daily. Use in each nostril as directed   cholecalciferol  (VITAMIN D3) 25 MCG (1000 UNIT) tablet Take 1 tablet (1,000 Units total) by mouth daily.   clopidogrel  (PLAVIX ) 75 MG tablet Take 1 tablet by mouth once daily   cyclobenzaprine  (FLEXERIL ) 10 MG tablet Take 1 tablet (10 mg total) by mouth 3 (three) times daily as needed for muscle spasms.   Evolocumab  (REPATHA  SURECLICK) 140 MG/ML SOAJ Inject 140 mg into the skin every 14 (fourteen) days. For cholesterol.   ezetimibe  (ZETIA ) 10 MG tablet TAKE 1 TABLET BY MOUTH ONCE DAILY FOR CHOLESTEROL   fluticasone  (FLONASE ) 50 MCG/ACT nasal spray Place 1 spray into both nostrils 2 (two) times daily.   fluticasone  (FLOVENT  HFA) 110 MCG/ACT inhaler Inhale 2 puffs into the lungs 2 (two) times daily.   Multiple Vitamins-Minerals (MULTIVITAMIN WITH MINERALS) tablet Take 1 tablet by mouth daily.   pantoprazole  (PROTONIX ) 20 MG tablet TAKE 1 TABLET BY MOUTH TWICE DAILY BEFORE MEAL(S) FOR HEARTBURN   rosuvastatin  (CRESTOR ) 40 MG tablet Take 1 tablet (40 mg total) by mouth daily. For cholesterol   sertraline  (ZOLOFT ) 100 MG tablet Take by mouth.   tirzepatide  (MOUNJARO ) 2.5 MG/0.5ML Pen Inject 2.5 mg into the skin once a week. for diabetes. (Patient not taking: Reported on 03/21/2024)   topiramate  (TOPAMAX ) 25 MG tablet TAKE 1 TABLET BY MOUTH ONCE DAILY FOR  HEADACHE  PREVENTION   No facility-administered encounter medications on file as of 05/16/2024.

## 2024-05-16 ENCOUNTER — Ambulatory Visit: Admitting: Family Medicine

## 2024-05-16 ENCOUNTER — Ambulatory Visit
Admission: RE | Admit: 2024-05-16 | Discharge: 2024-05-16 | Disposition: A | Discharge status: Home / Self Care | Source: Ambulatory Visit | Attending: Family Medicine | Admitting: Family Medicine

## 2024-05-16 ENCOUNTER — Encounter: Payer: Self-pay | Admitting: Family Medicine

## 2024-05-16 VITALS — BP 94/74 | HR 96 | Temp 97.7°F | Ht 62.0 in | Wt 170.1 lb

## 2024-05-16 DIAGNOSIS — J189 Pneumonia, unspecified organism: Secondary | ICD-10-CM

## 2024-05-16 DIAGNOSIS — R051 Acute cough: Secondary | ICD-10-CM

## 2024-05-16 DIAGNOSIS — R059 Cough, unspecified: Secondary | ICD-10-CM | POA: Diagnosis not present

## 2024-05-16 DIAGNOSIS — R918 Other nonspecific abnormal finding of lung field: Secondary | ICD-10-CM | POA: Diagnosis not present

## 2024-05-16 MED ORDER — DEXAMETHASONE SODIUM PHOSPHATE 100 MG/10ML IJ SOLN
10.0000 mg | Freq: Once | INTRAMUSCULAR | Status: AC
  Administered 2024-05-16: 16:00:00 10 mg via INTRAMUSCULAR

## 2024-05-16 MED ORDER — HYDROCOD POLI-CHLORPHE POLI ER 10-8 MG/5ML PO SUER: 5.0000 mL | Freq: Two times a day (BID) | ORAL | 0 refills | Status: DC | PRN

## 2024-05-16 MED ORDER — PREDNISONE 20 MG PO TABS: ORAL_TABLET | ORAL | 0 refills | Status: DC

## 2024-05-16 MED ORDER — LEVOFLOXACIN 500 MG PO TABS: 500.0000 mg | ORAL_TABLET | Freq: Every day | ORAL | 0 refills | Status: DC

## 2024-05-16 NOTE — Patient Instructions (Addendum)
 Stop Doxycycline    Albuterol  rescue inhaler:  2 puffs every 4 hours as needed  Prednisone :  Take 2 tablets a day for the next 7 days, then take one a day until the bottle is empty.  The new Tussionex (Chlorpeniramine) cough medication is the strongest that is made.  It has hydrocodone  in it and you cannot drive while taking that.   - I usually say only take it at night, but you can take it once in the morning if you are going to hang out and not do anything that day.

## 2024-05-17 NOTE — Telephone Encounter (Signed)
Called and schedule pt

## 2024-05-17 NOTE — Telephone Encounter (Signed)
 Noted

## 2024-05-20 ENCOUNTER — Ambulatory Visit

## 2024-05-20 DIAGNOSIS — I491 Atrial premature depolarization: Secondary | ICD-10-CM | POA: Diagnosis not present

## 2024-05-22 ENCOUNTER — Encounter: Payer: Self-pay | Admitting: Primary Care

## 2024-05-22 ENCOUNTER — Encounter: Payer: Self-pay | Admitting: Family

## 2024-05-22 ENCOUNTER — Ambulatory Visit: Admitting: Primary Care

## 2024-05-22 ENCOUNTER — Ambulatory Visit: Admitting: Family

## 2024-05-22 VITALS — BP 110/60 | HR 102 | Temp 98.0°F | Ht 62.0 in | Wt 166.0 lb

## 2024-05-22 DIAGNOSIS — J189 Pneumonia, unspecified organism: Secondary | ICD-10-CM

## 2024-05-22 DIAGNOSIS — B3731 Acute candidiasis of vulva and vagina: Secondary | ICD-10-CM | POA: Diagnosis not present

## 2024-05-22 DIAGNOSIS — J4541 Moderate persistent asthma with (acute) exacerbation: Secondary | ICD-10-CM

## 2024-05-22 LAB — CUP PACEART REMOTE DEVICE CHECK
Date Time Interrogation Session: 20251220232836
Implantable Pulse Generator Implant Date: 20250128

## 2024-05-22 MED ORDER — FLUCONAZOLE 150 MG PO TABS: 150.0000 mg | ORAL_TABLET | Freq: Once | ORAL | 0 refills | Status: AC

## 2024-05-22 MED ORDER — LEVOFLOXACIN 500 MG PO TABS: 500.0000 mg | ORAL_TABLET | Freq: Every day | ORAL | 0 refills | Status: AC

## 2024-05-22 MED ORDER — ALBUTEROL SULFATE (2.5 MG/3ML) 0.083% IN NEBU: 2.5000 mg | INHALATION_SOLUTION | Freq: Four times a day (QID) | RESPIRATORY_TRACT | 1 refills | Status: AC | PRN

## 2024-05-22 NOTE — Progress Notes (Signed)
 "  Established Patient Office Visit  Subjective:      CC:  Chief Complaint  Patient presents with   Cough    HPI: Amber [name] is a 53 y.o. female presenting on 05/22/2024 for Cough .  Discussed the use of AI scribe software for clinical note transcription with the patient, who gave verbal consent to proceed.  History of Present Illness Amber [name] is a 53 year old female who presents with persistent respiratory symptoms and medication side effects.  She has ongoing respiratory symptoms following a recent diagnosis of pneumonia, including a persistent dry cough that occasionally becomes productive and shortness of breath, particularly after exertion such as walking up stairs. Her breathing is slightly improving, but she remains restless and has difficulty sleeping.  She has been on Levaquin  for seven days with some improvement in symptoms. She is concerned about developing a yeast infection due to antibiotic use, as she has experienced this in the past. She reports a decrease in appetite and is trying to stay active by moving around her home.  She is using her albuterol  rescue inhaler two to three times daily. She has been prescribed multiple cough medications, including Tessalon  Perles and a hydrocodone -containing syrup, which she takes together.  She experiences sinus pressure, ear pain, and a sore throat, along with headaches that have increased in frequency. She has a history of diabetes and is monitoring her symptoms closely.  She is experiencing constipation, which she attributes to her medication regimen, particularly the hydrocodone  syrup.         Social history:  Relevant past medical, surgical, family and social history reviewed and updated as indicated. Interim medical history since our last visit reviewed.  Allergies and medications reviewed and updated.  DATA REVIEWED: CHART IN EPIC     ROS: Negative unless specifically indicated above in  HPI.   Current Medications[1]        Objective:        BP 110/60 (BP Location: Left Arm, Patient Position: Sitting, Cuff Size: Normal)   Pulse (!) 102   Temp 98 F (36.7 C) (Temporal)   Ht 5' 2 (1.575 m)   Wt 166 lb (75.3 kg)   SpO2 98%   BMI 30.36 kg/m   Physical Exam CHEST: Left lower lung sounds good. No wheezing.  Wt Readings from Last 3 Encounters:  05/22/24 166 lb (75.3 kg)  05/16/24 170 lb 2 oz (77.2 kg)  03/21/24 166 lb 6.4 oz (75.5 kg)    Physical Exam Vitals reviewed.  Constitutional:      General: She is not in acute distress.    Appearance: Normal appearance. She is normal weight. She is not ill-appearing, toxic-appearing or diaphoretic.  HENT:     Head: Normocephalic.     Right Ear: Tympanic membrane normal.     Left Ear: Tympanic membrane normal.     Nose:     Right Sinus: Frontal sinus tenderness present.     Left Sinus: Frontal sinus tenderness present.     Mouth/Throat:     Mouth: Mucous membranes are dry.     Pharynx: Posterior oropharyngeal erythema present. No oropharyngeal exudate.  Eyes:     Extraocular Movements: Extraocular movements intact.     Pupils: Pupils are equal, round, and reactive to light.  Cardiovascular:     Rate and Rhythm: Normal rate and regular rhythm.     Pulses: Normal pulses.     Heart sounds: Normal heart sounds.  Pulmonary:  Effort: Pulmonary effort is normal.     Breath sounds: Normal breath sounds.  Musculoskeletal:     Cervical back: Normal range of motion.  Neurological:     General: No focal deficit present.     Mental Status: She is alert and oriented to person, place, and time. Mental status is at baseline.  Psychiatric:        Mood and Affect: Mood normal.        Behavior: Behavior normal.        Thought Content: Thought content normal.        Judgment: Judgment normal.          Results Radiology Chest X-ray (05/15/2024): Left lower lobe pneumonia  Assessment & Plan:   Assessment  and Plan Assessment & Plan Community acquired pneumonia of left lung Community acquired pneumonia in the left lung with improvement in symptoms. Persistent dry cough and shortness of breath, especially with exertion. No wheezing on examination. Chest x-ray confirmed pneumonia. Improvement with prednisone  and Levaquin . - Prescribed three more days of Levaquin  for a total of ten days. - Prescribed nebulizer solution and nebulizer machine. - Advised use of Tessalon  Perles during the day and Desenex at night for cough management. - Recommended Mucinex  to thin secretions and aid in expectoration. -f/u with pcp for repeat CXR   Moderate persistent asthma with exacerbation Moderate persistent asthma with exacerbation. Increased use of albuterol  rescue inhaler, 2-3 times daily. Shortness of breath persists, especially with exertion. No wheezing on examination. - Prescribed nebulizer solution and nebulizer machine. - Continue albuterol  rescue inhaler as needed.  Acute candidiasis of vulva and vagina Acute candidiasis of the vulva and vagina, likely secondary to antibiotic use. Symptoms include vaginal itching and discomfort. - Prescribed antifungal treatment for yeast infection.  Constipation Likely secondary to hydrocodone  use in cough medication. Reports decreased bowel movements and hard stools. - Recommended stool softener and laxative such as Miralax  to alleviate constipation.        Return in about 3 weeks (around 06/12/2024) for f/u with PCP for f/u pneumonia and repeat CXR.     Amber Patrick, MSN, APRN, FNP-C [facility] Medicine        [1]  Current Outpatient Medications:    albuterol  (PROVENTIL ) (2.5 MG/3ML) 0.083% nebulizer solution, Take 3 mLs (2.5 mg total) by nebulization every 6 (six) hours as needed for wheezing or shortness of breath., Disp: 150 mL, Rfl: 1   albuterol  (VENTOLIN  HFA) 108 (90 Base) MCG/ACT inhaler, Inhale 2 puffs into the lungs every  4 (four) hours as needed for wheezing or shortness of breath (cough, shortness of breath or wheezing.)., Disp: 1 each, Rfl: 1   Ascorbic Acid  (VITAMIN C ) 1000 MG tablet, Take 1 tablet (1,000 mg total) by mouth daily., Disp: 30 tablet, Rfl: 0   azelastine  (ASTELIN ) 0.1 % nasal spray, Place 1 spray into both nostrils 2 (two) times daily. Use in each nostril as directed, Disp: 30 mL, Rfl: 0   benzonatate  (TESSALON ) 200 MG capsule, Take 200 mg by mouth 3 (three) times daily as needed., Disp: , Rfl:    chlorpheniramine-HYDROcodone  (TUSSIONEX) 10-8 MG/5ML, Take 5 mLs by mouth every 12 (twelve) hours as needed for cough., Disp: 115 mL, Rfl: 0   cholecalciferol  (VITAMIN D3) 25 MCG (1000 UNIT) tablet, Take 1 tablet (1,000 Units total) by mouth daily., Disp: 30 tablet, Rfl: 0   clopidogrel  (PLAVIX ) 75 MG tablet, Take 1 tablet by mouth once daily, Disp: 90 tablet, Rfl: 2  cyclobenzaprine  (FLEXERIL ) 10 MG tablet, Take 1 tablet (10 mg total) by mouth 3 (three) times daily as needed for muscle spasms., Disp: 30 tablet, Rfl: 0   doxycycline  (VIBRAMYCIN ) 100 MG capsule, Take 100 mg by mouth 2 (two) times daily., Disp: , Rfl:    Evolocumab  (REPATHA  SURECLICK) 140 MG/ML SOAJ, Inject 140 mg into the skin every 14 (fourteen) days. For cholesterol., Disp: 6 mL, Rfl: 1   ezetimibe  (ZETIA ) 10 MG tablet, TAKE 1 TABLET BY MOUTH ONCE DAILY FOR CHOLESTEROL, Disp: 90 tablet, Rfl: 2   fluconazole  (DIFLUCAN ) 150 MG tablet, Take 1 tablet (150 mg total) by mouth once for 1 dose., Disp: 1 tablet, Rfl: 0   fluticasone  (FLONASE ) 50 MCG/ACT nasal spray, Place 1 spray into both nostrils 2 (two) times daily., Disp: 16 g, Rfl: 0   fluticasone  (FLOVENT  HFA) 110 MCG/ACT inhaler, Inhale 2 puffs into the lungs 2 (two) times daily., Disp: 1 each, Rfl: 12   Multiple Vitamins-Minerals (MULTIVITAMIN WITH MINERALS) tablet, Take 1 tablet by mouth daily., Disp: , Rfl:    pantoprazole  (PROTONIX ) 20 MG tablet, TAKE 1 TABLET BY MOUTH TWICE DAILY BEFORE  MEAL(S) FOR HEARTBURN, Disp: 180 tablet, Rfl: 1   predniSONE  (DELTASONE ) 20 MG tablet, 2 tabs po for 4 days, then 1 tab po for 4 days, Disp: 12 tablet, Rfl: 0   rosuvastatin  (CRESTOR ) 20 MG tablet, Take 20 mg by mouth daily., Disp: , Rfl:    sertraline  (ZOLOFT ) 100 MG tablet, Take by mouth., Disp: , Rfl:    topiramate  (TOPAMAX ) 25 MG tablet, TAKE 1 TABLET BY MOUTH ONCE DAILY FOR  HEADACHE  PREVENTION, Disp: 90 tablet, Rfl: 1   levofloxacin  (LEVAQUIN ) 500 MG tablet, Take 1 tablet (500 mg total) by mouth daily for 3 days., Disp: 3 tablet, Rfl: 0  "

## 2024-05-22 NOTE — Patient Instructions (Signed)
 Tessalon  perrles for daytime cough   Hydrocodone  (tussionex) at night time for cough  Do not take both together   Extend levaquin  for three more days  Diflucan  x one dose for yeast infection   Nebulizer with albuterol  as needed Albuterol  inhaler as needed

## 2024-05-23 ENCOUNTER — Telehealth: Payer: Self-pay

## 2024-05-23 NOTE — Telephone Encounter (Signed)
 Called patient she will come by our office to pick up nebulizer we have here. She is aware we close at 12 and someone will need to sign form when she picks up.

## 2024-05-23 NOTE — Telephone Encounter (Signed)
 Copied from CRM #[id number]. Topic: Clinical - Prescription Issue >> May 22, 2024  4:38 PM Viola F wrote: Reason for CRM: [employer] needs narrative notes, face to face notes, and the medical diagnosis for patient nebulizer in order to release it to her. There number is [phone]/Jill. Please call patient with an update at [phone] or if she doesn't anwer call her daughter Jordyn at [phone]

## 2024-05-23 NOTE — Progress Notes (Signed)
 Remote Loop Recorder Transmission

## 2024-05-27 ENCOUNTER — Ambulatory Visit: Payer: Self-pay | Admitting: Cardiology

## 2024-06-04 ENCOUNTER — Ambulatory Visit: Payer: Self-pay | Admitting: Primary Care

## 2024-06-04 ENCOUNTER — Other Ambulatory Visit (INDEPENDENT_AMBULATORY_CARE_PROVIDER_SITE_OTHER)

## 2024-06-04 ENCOUNTER — Encounter: Payer: Self-pay | Admitting: Family Medicine

## 2024-06-04 DIAGNOSIS — E785 Hyperlipidemia, unspecified: Secondary | ICD-10-CM | POA: Diagnosis not present

## 2024-06-04 DIAGNOSIS — J189 Pneumonia, unspecified organism: Secondary | ICD-10-CM

## 2024-06-04 DIAGNOSIS — E1169 Type 2 diabetes mellitus with other specified complication: Secondary | ICD-10-CM

## 2024-06-04 DIAGNOSIS — E1165 Type 2 diabetes mellitus with hyperglycemia: Secondary | ICD-10-CM | POA: Diagnosis not present

## 2024-06-04 LAB — LIPID PANEL
Cholesterol: 216 mg/dL — ABNORMAL HIGH (ref 28–200)
HDL: 96.3 mg/dL
LDL Cholesterol: 105 mg/dL — ABNORMAL HIGH (ref 10–99)
NonHDL: 119.74
Total CHOL/HDL Ratio: 2
Triglycerides: 76 mg/dL (ref 10.0–149.0)
VLDL: 15.2 mg/dL (ref 0.0–40.0)

## 2024-06-04 NOTE — Telephone Encounter (Signed)
 CXR ordered.  Relayed to patient.

## 2024-06-04 NOTE — Addendum Note (Signed)
 Addended by: HOPE VEVA PARAS on: 06/04/2024 03:44 PM   Modules accepted: Orders

## 2024-06-05 LAB — HEMOGLOBIN A1C: Hgb A1c MFr Bld: 6.5 % (ref 4.6–6.5)

## 2024-06-05 NOTE — Telephone Encounter (Signed)
 Olam, can we cancel the appointment for her scheduled for tomorrow (06/06/24)? She is coming in on the 13th instead.

## 2024-06-06 ENCOUNTER — Ambulatory Visit: Admitting: Primary Care

## 2024-06-12 ENCOUNTER — Ambulatory Visit: Payer: Self-pay | Admitting: Primary Care

## 2024-06-12 ENCOUNTER — Encounter: Payer: Self-pay | Admitting: Primary Care

## 2024-06-12 ENCOUNTER — Ambulatory Visit: Admitting: Primary Care

## 2024-06-12 ENCOUNTER — Ambulatory Visit (INDEPENDENT_AMBULATORY_CARE_PROVIDER_SITE_OTHER)
Admission: RE | Admit: 2024-06-12 | Discharge: 2024-06-12 | Disposition: A | Source: Ambulatory Visit | Attending: Primary Care

## 2024-06-12 VITALS — BP 122/86 | HR 160 | Temp 98.2°F | Ht 62.0 in | Wt 169.0 lb

## 2024-06-12 DIAGNOSIS — J0140 Acute pansinusitis, unspecified: Secondary | ICD-10-CM

## 2024-06-12 DIAGNOSIS — J189 Pneumonia, unspecified organism: Secondary | ICD-10-CM | POA: Diagnosis not present

## 2024-06-12 DIAGNOSIS — J4540 Moderate persistent asthma, uncomplicated: Secondary | ICD-10-CM | POA: Diagnosis not present

## 2024-06-12 DIAGNOSIS — E1165 Type 2 diabetes mellitus with hyperglycemia: Secondary | ICD-10-CM | POA: Diagnosis not present

## 2024-06-12 MED ORDER — FLUTICASONE FUROATE-VILANTEROL 100-25 MCG/ACT IN AEPB: 1.0000 | INHALATION_SPRAY | Freq: Every day | RESPIRATORY_TRACT | 11 refills | Status: AC

## 2024-06-12 MED ORDER — FLUTICASONE PROPIONATE 50 MCG/ACT NA SUSP: 1.0000 | Freq: Two times a day (BID) | NASAL | 0 refills | Status: AC

## 2024-06-12 NOTE — Assessment & Plan Note (Signed)
 Slightly increased with A1C of 6.5 today. She has been on steroid treatment for her community-acquired pneumonia  Continue off treatment for now.  If A1c increases above 6.5 then we will initiate treatment. Intolerant to GLP-1 agonist medications

## 2024-06-12 NOTE — Progress Notes (Signed)
 "  Subjective:    Patient ID: Amber [name], female    DOB: September 01, 1970, 54 y.o.   MRN: [id number]  Amber [name] is a very pleasant 54 y.o. female with a history of hypertension, recurrent CVA, type 2 diabetes, hyperlipidemia who presents today for follow-up of pneumonia and diabetes.  1) Type 2 Diabetes:  Current medications include: None  She is checking her blood glucose 0 times daily.  Last A1C: 6.5 in January 2026, 6.2 in June 2025 Last Eye Exam: Up-to-date Last Foot Exam: Due Pneumonia Vaccination: Up-to-date Urine Microalbumin: Due Statin: Rosuvastatin   2) Community Acquired Pneumonia: Originally evaluated on 05/13/2024 at [facility] urgent care for a 5-day history of cough.  She was diagnosed with community-acquired pneumonia and was prescribed doxycycline  twice daily x 10 days.    Evaluated again on 05/16/2024 per Dr. Watt for ongoing cough and congestion despite compliance doxycycline .  Doxycycline  was discontinued and she was initiated on Levaquin  and prednisone .   She was last evaluated on 05/22/2024 per Tabitha NP for persistent dry cough, intermittent production and shortness of breath.  During this visit her Levaquin  was extended for additional days to make a total of 10 days.  She was prescribed nebulizer solution and a nebulizer machine to use as needed.  She underwent chest x-ray on 05/16/2024 which revealed left upper lobe pneumonia.  The x-ray was not read until 06/02/2024.  Today she feels somewhat better but continues to experience shortness of breath and fatigue. Her shortness of breath and fatigue occur when dressing, bathing, walking up and down her stairs at home. She's increased her sleeping. Her cough is now dry. She is using her nebulizer treatment with temporary improvement. She is not on maintenance treatment for asthma.   She is not sleeping well, has been up all night some nights. She has been out of work since her symptoms began on 05/03/24. She would  like to return February 1st working 35 hours per week (8 am to 3 pm) if she's feeing better.  She has paperwork with her today  Review of Systems  Constitutional:  Positive for fatigue. Negative for fever.  HENT:  Negative for congestion.   Respiratory:  Positive for cough and shortness of breath.   Cardiovascular:  Negative for chest pain.         Past Medical History:  Diagnosis Date   Anxiety    self reported   Asthma    Cellulitis, toe 11/18/2021   Left great toe  No abscess    Depression    controlled   Diabetes mellitus without complication (HCC)    Hyperlipidemia    controlled with medication   IBS (irritable bowel syndrome)    Left facial numbness 02/01/2023   Left lower quadrant abdominal pain 04/07/2023   Left shoulder pain 08/02/2018   Muscle cramps    Precordial pain 09/29/2014   Recurrent strokes (HCC)    Slurred speech    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   Stroke (HCC) 03/13/2015   Tension headache 11/18/2020   Viral upper respiratory tract infection 05/07/2022   Word finding difficulty 02/27/2018    Social History   Socioeconomic History   Marital status: Single    Spouse name: Not on file   Number of children: Not on file   Years of education: Not on file   Highest education level: Not on file  Occupational History   Not on file  Tobacco Use   Smoking status: Never   Smokeless tobacco: Never  Vaping Use   Vaping status: Never Used  Substance and Sexual Activity   Alcohol use: Not Currently    Alcohol/week: 7.0 standard drinks of alcohol    Types: 7 Glasses of wine per week    Comment: red wine occasionally but not since stroke   Drug use: Not Currently    Types: Marijuana    Comment: edibles-twice weekly   Sexual activity: Not Currently  Other Topics Concern   Not on file  Social History Narrative   Originally from [place]    Family lives up here in [place]    Has one daughter.   Enjoys spending time shopping and  spending time with family    Social Drivers of Health   Tobacco Use: Low Risk (06/12/2024)   Patient History    Smoking Tobacco Use: Never    Smokeless Tobacco Use: Never    Passive Exposure: Not on file  Financial Resource Strain: Not on file  Food Insecurity: No Food Insecurity (05/06/2023)   Hunger Vital Sign    Worried About Running Out of Food in the Last Year: Never true    Ran Out of Food in the Last Year: Never true  Transportation Needs: No Transportation Needs (05/06/2023)   PRAPARE - Administrator, Civil Service (Medical): No    Lack of Transportation (Non-Medical): No  Physical Activity: Not on file  Stress: Not on file  Social Connections: Not on file  Intimate Partner Violence: Not At Risk (05/06/2023)   Humiliation, Afraid, Rape, and Kick questionnaire    Fear of Current or Ex-Partner: No    Emotionally Abused: No    Physically Abused: No    Sexually Abused: No  Depression (PHQ2-9): Medium Risk (03/14/2024)   Depression (PHQ2-9)    PHQ-2 Score: 6  Alcohol Screen: Not on file  Housing: Low Risk (05/06/2023)   Housing    Last Housing Risk Score: 0  Utilities: Not At Risk (05/06/2023)   AHC Utilities    Threatened with loss of utilities: No  Health Literacy: Not on file    Past Surgical History:  Procedure Laterality Date   ABDOMINAL HYSTERECTOMY  10/2006   bowel reconstruction  10/2006   with hysterectomy   CESAREAN SECTION  2005   EP IMPLANTABLE DEVICE N/A 03/17/2015   Procedure: Loop Recorder Insertion;  Surgeon: Will Gladis Norton, MD;  Location: [facility] INVASIVE CV LAB;  Service: Cardiovascular;  Laterality: N/A;   TEE WITHOUT CARDIOVERSION N/A 03/17/2015   Procedure: TRANSESOPHAGEAL ECHOCARDIOGRAM (TEE);  Surgeon: Annabella Scarce, MD;  Location: [facility] ENDOSCOPY;  Service: Cardiovascular;  Laterality: N/A;   TEE WITHOUT CARDIOVERSION N/A 01/06/2021   Procedure: TRANSESOPHAGEAL ECHOCARDIOGRAM (TEE);  Surgeon: Mady Bruckner, MD;  Location: [facility] ORS;   Service: Cardiovascular;  Laterality: N/A;   TOE SURGERY     Left 2nd metatarsal    Family History  Problem Relation Age of Onset   Hyperlipidemia Mother    Heart failure Father    Arthritis Father    Diabetes Father    Hypertension Father    Hyperlipidemia Father    Prostate cancer Father    Stroke Father    Heart attack Brother 56   Prostate cancer Maternal Uncle        x 3   Heart attack Maternal Grandfather    Hyperlipidemia Maternal Grandfather    Prostate cancer Maternal Grandfather    Diabetes Paternal Grandmother    Stroke Paternal Grandmother    Diabetes Paternal Grandfather  Heart attack Paternal Grandfather    Colon cancer Neg Hx    Colon polyps Neg Hx    Stomach cancer Neg Hx    Esophageal cancer Neg Hx    Pancreatic cancer Neg Hx     Allergies[1]  Medications Ordered Prior to Encounter[2]  BP 122/86   Pulse (!) 160   Temp 98.2 F (36.8 C) (Oral)   Ht 5' 2 (1.575 m)   Wt 169 lb (76.7 kg)   SpO2 96%   BMI 30.91 kg/m  Objective:   Physical Exam Cardiovascular:     Rate and Rhythm: Normal rate and regular rhythm.  Pulmonary:     Effort: Pulmonary effort is normal.     Breath sounds: Normal breath sounds.     Comments: Respiratory effort increased with talking today. Musculoskeletal:     Cervical back: Neck supple.  Skin:    General: Skin is warm and dry.  Neurological:     Mental Status: She is alert and oriented to person, place, and time.  Psychiatric:        Mood and Affect: Mood normal.     Physical Exam        Assessment & Plan:  Community acquired pneumonia of left lung, unspecified part of lung Assessment & Plan: Respiratory exam today overall reassuring  Repeat chest x-ray pending Agreed to complete paperwork with return date on February 1 working 35 hours/week until March 1  Orders: -     DG Chest 2 View  Acute non-recurrent pansinusitis -     Fluticasone  Propionate; Place 1 spray into both nostrils 2 (two) times  daily.  Dispense: 48 g; Refill: 0  Moderate persistent asthma without complication Assessment & Plan: Possibly aggravated by recent pneumonia.  Start Breo Ellipta  100-25 mcg, 1 puff daily for maintenance. Continue albuterol  inhaler/nebulizers as needed.  Orders: -     Fluticasone  Furoate-Vilanterol; Inhale 1 puff into the lungs daily.  Dispense: 1 each; Refill: 11  Controlled type 2 diabetes mellitus with hyperglycemia (HCC) Assessment & Plan: Slightly increased with A1C of 6.5 today. She has been on steroid treatment for her community-acquired pneumonia  Continue off treatment for now.  If A1c increases above 6.5 then we will initiate treatment. Intolerant to GLP-1 agonist medications       Assessment and Plan Assessment & Plan         Comer MARLA Gaskins, NP       [1]  Allergies Allergen Reactions   Cephalexin Hives and Rash    Can take Augmentin   [2]  Current Outpatient Medications on File Prior to Visit  Medication Sig Dispense Refill   albuterol  (PROVENTIL ) (2.5 MG/3ML) 0.083% nebulizer solution Take 3 mLs (2.5 mg total) by nebulization every 6 (six) hours as needed for wheezing or shortness of breath. 150 mL 1   Ascorbic Acid  (VITAMIN C ) 1000 MG tablet Take 1 tablet (1,000 mg total) by mouth daily. 30 tablet 0   azelastine  (ASTELIN ) 0.1 % nasal spray Place 1 spray into both nostrils 2 (two) times daily. Use in each nostril as directed 30 mL 0   cholecalciferol  (VITAMIN D3) 25 MCG (1000 UNIT) tablet Take 1 tablet (1,000 Units total) by mouth daily. 30 tablet 0   clopidogrel  (PLAVIX ) 75 MG tablet Take 1 tablet by mouth once daily 90 tablet 2   cyclobenzaprine  (FLEXERIL ) 10 MG tablet Take 1 tablet (10 mg total) by mouth 3 (three) times daily as needed for muscle spasms. 30 tablet 0  Evolocumab  (REPATHA  SURECLICK) 140 MG/ML SOAJ Inject 140 mg into the skin every 14 (fourteen) days. For cholesterol. 6 mL 1   ezetimibe  (ZETIA ) 10 MG tablet TAKE 1 TABLET BY MOUTH  ONCE DAILY FOR CHOLESTEROL 90 tablet 2   Multiple Vitamins-Minerals (MULTIVITAMIN WITH MINERALS) tablet Take 1 tablet by mouth daily.     pantoprazole  (PROTONIX ) 20 MG tablet TAKE 1 TABLET BY MOUTH TWICE DAILY BEFORE MEAL(S) FOR HEARTBURN 180 tablet 1   rosuvastatin  (CRESTOR ) 20 MG tablet Take 20 mg by mouth daily.     sertraline  (ZOLOFT ) 100 MG tablet Take by mouth.     topiramate  (TOPAMAX ) 25 MG tablet TAKE 1 TABLET BY MOUTH ONCE DAILY FOR  HEADACHE  PREVENTION 90 tablet 1   No current facility-administered medications on file prior to visit.   "

## 2024-06-12 NOTE — Assessment & Plan Note (Signed)
 Respiratory exam today overall reassuring  Repeat chest x-ray pending Agreed to complete paperwork with return date on February 1 working 35 hours/week until March 1

## 2024-06-12 NOTE — Assessment & Plan Note (Signed)
 Possibly aggravated by recent pneumonia.  Start Breo Ellipta  100-25 mcg, 1 puff daily for maintenance. Continue albuterol  inhaler/nebulizers as needed.

## 2024-06-12 NOTE — Patient Instructions (Signed)
 Start the Breo inhaler for asthma.  Inhale 1 puff into the lungs daily.  Complete xray(s) prior to leaving today. I will notify you of your results once received.  Please schedule a physical to meet with me in 6 months.   It was a pleasure to see you today!

## 2024-06-14 NOTE — Telephone Encounter (Signed)
 Per Manuelita, pt's form was faxed 06/13/24.   Spoke with pt notifying her of above info. Informed pt I will print a copy of form for her to pick up to keep for her records and I'll let her know when it's ready. Pt verbalizes understanding. In the meantime, she will check with her job to see if they have received it yet. Says she will let us  know by tomorrow if they haven't. Pt expresses her thanks for everything.

## 2024-06-14 NOTE — Telephone Encounter (Signed)
 Spoke with pt asking if she brought short term disability (STD) ppw to her 06/12/24 OV with Mallie (see 06/04/24 Results F/u notes). Pt confirms she did and gave it to [place]. Pt states she's aware Mallie is out of office until 06/19/24 but her ppw is due by 06/18/24. Notified pt I'm not at [facility] right now but will be there later this afternoon and will check Kate's boxes in her office for ppw. Pt expresses her thanks.

## 2024-06-14 NOTE — Telephone Encounter (Unsigned)
 Copied from CRM #[id number]. Topic: General - Other >> Jun 14, 2024 11:39 AM Viola F wrote: Reason for CRM: Patient called to follow up on disability paperwork - it's due 06/18/24 and she hasn't gotten paid since last month. She reached out via MyChart and noticed that Comer it out of the office until 06/19/24. Please call her today with an update at [phone].

## 2024-06-15 NOTE — Telephone Encounter (Unsigned)
 Copied from CRM [id number]. Topic: General - Other >> Jun 15, 2024  9:58 AM Berneda FALCON wrote: Reason for CRM: Patient called in wanting to see if the documentation was ready for her to pick up regarding her disability and return to work forms. States her employer told her they still have not received the forms so the MA was going to print them out and have patient pick up the physical forms. Patient was disconnected and when I tried to call her back, I got an error. Please call patient and inform her if the forms are ready for pickup.  Patient callback [phone] (home)

## 2024-06-15 NOTE — Telephone Encounter (Signed)
 Pt came by office for copy of form. States she was told it was ready to pick up.   I've been checking 'Media' tab in pt's chart and haven't seen form pop up yet. Offered my apologies for pt making the trip to the office. Pt provided a blank copy of form and has uploaded a screenshot of the info Mallie included on original form, in case it needs to be done again. Thanked pt for info, placed form in basket on my (Lisa's) desk and informed her she'll be contacted as soon as everything is taken care of. Pt verbalizes understanding and expresses her thanks for the help.

## 2024-06-18 NOTE — Telephone Encounter (Unsigned)
 Copied from CRM [id number]. Topic: General - Call Back - No Documentation >> Jun 18, 2024  1:53 PM Rea C wrote: Reason for CRM: Pt called in to see if [place] can receive and send over information to her employer for her to return to work.   [phone] BENNIE) >> Jun 18, 2024  1:55 PM Rea C wrote: Patient stated this is the last day for it to go in.

## 2024-06-20 ENCOUNTER — Ambulatory Visit

## 2024-06-20 DIAGNOSIS — I491 Atrial premature depolarization: Secondary | ICD-10-CM | POA: Diagnosis not present

## 2024-06-20 LAB — CUP PACEART REMOTE DEVICE CHECK
Date Time Interrogation Session: 20260120233421
Implantable Pulse Generator Implant Date: 20250128

## 2024-06-21 ENCOUNTER — Telehealth: Payer: Self-pay | Admitting: Primary Care

## 2024-06-21 NOTE — Telephone Encounter (Signed)
 STD Physician form received.  Will fax to disability claims at [phone] when complete.  Will call the patient once forms are complete per patient.  Forms placed in providers box for review.

## 2024-06-21 NOTE — Telephone Encounter (Signed)
 Type of form received: Short Term Disability    Additional comments:   Received by: Renae    Form should be Faxed to:   Form should be mailed to:     Is patient requesting call for pickup: Y     Form placed:  Erins box    Attach charge sheet. Y   Individual made aware of 3-5 business day turn around (Y/N)? Y

## 2024-06-21 NOTE — Telephone Encounter (Signed)
 Completed forms and placed on Erin's desk.

## 2024-06-22 NOTE — Telephone Encounter (Signed)
 Completed forms received  Copy sent to scan  Patient notified and will pick up copy at the front desk today 1.23.26

## 2024-06-23 NOTE — Progress Notes (Signed)
 Remote Loop Recorder Transmission

## 2024-06-25 ENCOUNTER — Ambulatory Visit: Payer: Self-pay | Admitting: Cardiology

## 2024-07-04 ENCOUNTER — Ambulatory Visit: Payer: Self-pay

## 2024-07-04 NOTE — Telephone Encounter (Signed)
 Pt c/o painless, healing bruise to thigh x 2 weeks with unknown cause. Pt has been reassured that easy bruising is typical when taking Plavix . Pt expresses that she understands however would like a provider to evaluate the bruise due to her many medical problems. Appt scheduled for tomorrow with Dr. Bedsole as the pt declined a Friday appt with Comer NP.   RICK Only or Action Required?: FYI only for provider: appointment scheduled on 2/5.  Patient was last seen in primary care on 06/12/2024 by Gretta Comer POUR, NP.  Called Nurse Triage reporting bruise.  Symptoms began several weeks ago.  Interventions attempted: Nothing.  Symptoms are: stable.  Triage Disposition: See Physician Within 24 Hours  Patient/caregiver understands and will follow disposition?: Yes     Reason for Triage: Patient called said she has a bruise and it hurts a little bit. Also stated she had it since January 16th, wants to get it looked at because she is a stroke survivor.    Reason for Disposition  Taking Coumadin (warfarin) or other strong blood thinner, OR known bleeding disorder (e.g., thrombocytopenia)  (Exception: Small bruise at heparin injection site.)  Answer Assessment - Initial Assessment Questions 1. APPEARANCE of BRUISE: Describe the bruise.      Started off black and blue, now it's turning red. When I touch it, it doesn't hurt. I can still walk. Just with my different medical conditions, I want to make sure it's okay.    3. NUMBER: How many bruises are there?      1  4. LOCATION: Where is the bruise located?      Left thigh  5. ONSET: How long ago did the bruise occur?      1/16  6. CAUSE: What do you think caused the bruise?     Pt denies injury or known cause  7. MEDICAL HISTORY: Do you have any medical problems that can cause easy bruising or bleeding? (e.g., leukemia, liver disease, recent chemotherapy)     DM 2  8. MEDICINES: Do you take any medicines which  thin the blood such as: aspirin , apixaban, heparin, ibuprofen (NSAIDS), Plavix , or Coumadin?     Plavix   9. OTHER SYMPTOMS: Do you have any other symptoms?  (e.g., weakness, dizziness, pain, fever, nosebleed, blood in urine/stool)     Denies any other or new symptoms  Protocols used: Bruises-A-AH

## 2024-07-04 NOTE — Telephone Encounter (Signed)
 Noted. Agree with nursing triage decision. Appreciate Dr Sherrel evaluation.

## 2024-07-05 ENCOUNTER — Encounter: Payer: Self-pay | Admitting: Family Medicine

## 2024-07-05 ENCOUNTER — Ambulatory Visit: Admitting: Family Medicine

## 2024-07-05 ENCOUNTER — Ambulatory Visit: Payer: Self-pay | Admitting: Family Medicine

## 2024-07-05 VITALS — BP 136/96 | HR 55 | Temp 98.1°F | Ht 62.0 in | Wt 171.0 lb

## 2024-07-05 DIAGNOSIS — S8012XA Contusion of left lower leg, initial encounter: Secondary | ICD-10-CM | POA: Insufficient documentation

## 2024-07-05 LAB — COMPREHENSIVE METABOLIC PANEL WITH GFR
ALT: 22 U/L (ref 3–35)
AST: 20 U/L (ref 5–37)
Albumin: 4.3 g/dL (ref 3.5–5.2)
Alkaline Phosphatase: 72 U/L (ref 39–117)
BUN: 11 mg/dL (ref 6–23)
CO2: 32 meq/L (ref 19–32)
Calcium: 9.7 mg/dL (ref 8.4–10.5)
Chloride: 107 meq/L (ref 96–112)
Creatinine, Ser: 0.93 mg/dL (ref 0.40–1.20)
GFR: 71.02 mL/min
Glucose, Bld: 93 mg/dL (ref 70–99)
Potassium: 4.3 meq/L (ref 3.5–5.1)
Sodium: 141 meq/L (ref 135–145)
Total Bilirubin: 0.5 mg/dL (ref 0.2–1.2)
Total Protein: 7.3 g/dL (ref 6.0–8.3)

## 2024-07-05 LAB — CBC WITH DIFFERENTIAL/PLATELET
Basophils Absolute: 0 10*3/uL (ref 0.0–0.1)
Basophils Relative: 0.6 % (ref 0.0–3.0)
Eosinophils Absolute: 0.1 10*3/uL (ref 0.0–0.7)
Eosinophils Relative: 1.3 % (ref 0.0–5.0)
HCT: 44.1 % (ref 36.0–46.0)
Hemoglobin: 14.5 g/dL (ref 12.0–15.0)
Lymphocytes Relative: 38.4 % (ref 12.0–46.0)
Lymphs Abs: 1.7 10*3/uL (ref 0.7–4.0)
MCHC: 33 g/dL (ref 30.0–36.0)
MCV: 87.1 fl (ref 78.0–100.0)
Monocytes Absolute: 0.3 10*3/uL (ref 0.1–1.0)
Monocytes Relative: 7.7 % (ref 3.0–12.0)
Neutro Abs: 2.4 10*3/uL (ref 1.4–7.7)
Neutrophils Relative %: 52 % (ref 43.0–77.0)
Platelets: 209 10*3/uL (ref 150.0–400.0)
RBC: 5.07 Mil/uL (ref 3.87–5.11)
RDW: 14.5 % (ref 11.5–15.5)
WBC: 4.5 10*3/uL (ref 4.0–10.5)

## 2024-07-05 LAB — PROTIME-INR
INR: 1 ratio (ref 0.8–1.0)
Prothrombin Time: 10.9 s (ref 9.6–13.1)

## 2024-07-05 NOTE — Assessment & Plan Note (Signed)
"   Acute, likely secondary to  anticoagulation on plavix  and possibly recent additional or multiple course of prednisone .  Will evaluate for additional cause of bleeding... no red flags.   "

## 2024-07-05 NOTE — Progress Notes (Signed)
 "   Patient ID: Amber [name], female    DOB: 1970/10/24, 54 y.o.   MRN: [id number]  This visit was conducted in person.  BP (!) 136/96 (BP Location: Right Arm, Patient Position: Sitting, Cuff Size: Normal)   Pulse (!) 55   Temp 98.1 F (36.7 C) (Oral)   Ht 5' 2 (1.575 m)   Wt 171 lb (77.6 kg)   SpO2 99%   BMI 31.28 kg/m    CC:  Chief Complaint  Patient presents with   Acute Visit    Unknown bruise on thigh and calf of left leg,  onset 1/16 Pt is on plavix       Subjective:   HPI: Amber [name] is a 54 y.o. female presenting on 07/05/2024 for Acute Visit (Unknown bruise on thigh and calf of left leg,  onset 1/16/Pt is on plavix  /)  New  onset  contusion on right  calf, noted on 06/16/2023.. no known injury or change in activity  Bruise has  decreased in size, only mild pain.  Has not stopped her from her daily routines.   Has also noted small bruise on upper thigh.  No bloosd in stool, no blood in urine, no nose bleeds or gums bleeding.   History of CVA on plavix     Recent PNA... less active but still moving around each day.  Improved after prednisone , levaquin    No NSAID... had been using tylenol .     BP Readings from Last 3 Encounters:  07/05/24 (!) 136/96  06/12/24 122/86  05/22/24 110/60     Relevant past medical, surgical, family and social history reviewed and updated as indicated. Interim medical history since our last visit reviewed. Allergies and medications reviewed and updated. Outpatient Medications Prior to Visit  Medication Sig Dispense Refill   albuterol  (PROVENTIL ) (2.5 MG/3ML) 0.083% nebulizer solution Take 3 mLs (2.5 mg total) by nebulization every 6 (six) hours as needed for wheezing or shortness of breath. 150 mL 1   Ascorbic Acid  (VITAMIN C ) 1000 MG tablet Take 1 tablet (1,000 mg total) by mouth daily. 30 tablet 0   azelastine  (ASTELIN ) 0.1 % nasal spray Place 1 spray into both nostrils 2 (two) times daily. Use in each nostril as directed 30  mL 0   cholecalciferol  (VITAMIN D3) 25 MCG (1000 UNIT) tablet Take 1 tablet (1,000 Units total) by mouth daily. 30 tablet 0   clopidogrel  (PLAVIX ) 75 MG tablet Take 1 tablet by mouth once daily 90 tablet 2   Evolocumab  (REPATHA  SURECLICK) 140 MG/ML SOAJ Inject 140 mg into the skin every 14 (fourteen) days. For cholesterol. 6 mL 1   ezetimibe  (ZETIA ) 10 MG tablet TAKE 1 TABLET BY MOUTH ONCE DAILY FOR CHOLESTEROL 90 tablet 2   fluticasone  (FLONASE ) 50 MCG/ACT nasal spray Place 1 spray into both nostrils 2 (two) times daily. 48 g 0   fluticasone  furoate-vilanterol (BREO ELLIPTA ) 100-25 MCG/ACT AEPB Inhale 1 puff into the lungs daily. 1 each 11   Multiple Vitamins-Minerals (MULTIVITAMIN WITH MINERALS) tablet Take 1 tablet by mouth daily.     pantoprazole  (PROTONIX ) 20 MG tablet TAKE 1 TABLET BY MOUTH TWICE DAILY BEFORE MEAL(S) FOR HEARTBURN 180 tablet 1   rosuvastatin  (CRESTOR ) 20 MG tablet Take 20 mg by mouth daily.     sertraline  (ZOLOFT ) 100 MG tablet Take by mouth.     topiramate  (TOPAMAX ) 25 MG tablet TAKE 1 TABLET BY MOUTH ONCE DAILY FOR  HEADACHE  PREVENTION 90 tablet 1   cyclobenzaprine  (FLEXERIL ) 10 MG  tablet Take 1 tablet (10 mg total) by mouth 3 (three) times daily as needed for muscle spasms. (Patient not taking: Reported on 07/05/2024) 30 tablet 0   No facility-administered medications prior to visit.     Per HPI unless specifically indicated in ROS section below Review of Systems  Constitutional:  Negative for fatigue and fever.  HENT:  Negative for congestion.   Eyes:  Negative for pain.  Respiratory:  Negative for cough and shortness of breath.   Cardiovascular:  Negative for chest pain, palpitations and leg swelling.  Gastrointestinal:  Negative for abdominal pain.  Genitourinary:  Negative for dysuria and vaginal bleeding.  Musculoskeletal:  Negative for back pain.  Neurological:  Negative for syncope, light-headedness and headaches.  Psychiatric/Behavioral:  Negative for  dysphoric mood.    Objective:  BP (!) 136/96 (BP Location: Right Arm, Patient Position: Sitting, Cuff Size: Normal)   Pulse (!) 55   Temp 98.1 F (36.7 C) (Oral)   Ht 5' 2 (1.575 m)   Wt 171 lb (77.6 kg)   SpO2 99%   BMI 31.28 kg/m   Wt Readings from Last 3 Encounters:  07/05/24 171 lb (77.6 kg)  06/12/24 169 lb (76.7 kg)  05/22/24 166 lb (75.3 kg)      Physical Exam Constitutional:      General: She is not in acute distress.    Appearance: Normal appearance. She is well-developed. She is not ill-appearing or toxic-appearing.  HENT:     Head: Normocephalic.     Right Ear: Hearing, tympanic membrane, ear canal and external ear normal. Tympanic membrane is not erythematous, retracted or bulging.     Left Ear: Hearing, tympanic membrane, ear canal and external ear normal. Tympanic membrane is not erythematous, retracted or bulging.     Nose: No mucosal edema or rhinorrhea.     Right Sinus: No maxillary sinus tenderness or frontal sinus tenderness.     Left Sinus: No maxillary sinus tenderness or frontal sinus tenderness.     Mouth/Throat:     Pharynx: Uvula midline.  Eyes:     General: Lids are normal. Lids are everted, no foreign bodies appreciated.     Conjunctiva/sclera: Conjunctivae normal.     Pupils: Pupils are equal, round, and reactive to light.  Neck:     Thyroid : No thyroid  mass or thyromegaly.     Vascular: No carotid bruit.     Trachea: Trachea normal.  Cardiovascular:     Rate and Rhythm: Normal rate and regular rhythm.     Pulses: Normal pulses.     Heart sounds: Normal heart sounds, S1 normal and S2 normal. No murmur heard.    No friction rub. No gallop.  Pulmonary:     Effort: Pulmonary effort is normal. No tachypnea or respiratory distress.     Breath sounds: Normal breath sounds. No decreased breath sounds, wheezing, rhonchi or rales.  Abdominal:     General: Bowel sounds are normal.     Palpations: Abdomen is soft.     Tenderness: There is no  abdominal tenderness.  Musculoskeletal:     Cervical back: Normal range of motion and neck supple.  Skin:    General: Skin is warm and dry.     Findings: No rash.         Comments:  Contusion left lower leg,   small hematoma palpated near  calf contusion  Neurological:     Mental Status: She is alert.  Psychiatric:  Mood and Affect: Mood is not anxious or depressed.        Speech: Speech normal.        Behavior: Behavior normal. Behavior is cooperative.        Thought Content: Thought content normal.        Judgment: Judgment normal.       Results for orders placed or performed in visit on 07/05/24  CBC with Differential/Platelet   Collection Time: 07/05/24 12:08 PM  Result Value Ref Range   WBC 4.5 4.0 - 10.5 K/uL   RBC 5.07 3.87 - 5.11 Mil/uL   Hemoglobin 14.5 12.0 - 15.0 g/dL   HCT 55.8 63.9 - 53.9 %   MCV 87.1 78.0 - 100.0 fl   MCHC 33.0 30.0 - 36.0 g/dL   RDW 85.4 88.4 - 84.4 %   Platelets 209.0 150.0 - 400.0 K/uL   Neutrophils Relative % 52.0 43.0 - 77.0 %   Lymphocytes Relative 38.4 12.0 - 46.0 %   Monocytes Relative 7.7 3.0 - 12.0 %   Eosinophils Relative 1.3 0.0 - 5.0 %   Basophils Relative 0.6 0.0 - 3.0 %   Neutro Abs 2.4 1.4 - 7.7 K/uL   Lymphs Abs 1.7 0.7 - 4.0 K/uL   Monocytes Absolute 0.3 0.1 - 1.0 K/uL   Eosinophils Absolute 0.1 0.0 - 0.7 K/uL   Basophils Absolute 0.0 0.0 - 0.1 K/uL  Protime-INR   Collection Time: 07/05/24 12:08 PM  Result Value Ref Range   INR 1.0 0.8 - 1.0 ratio   Prothrombin Time 10.9 9.6 - 13.1 sec  Comprehensive metabolic panel with GFR   Collection Time: 07/05/24 12:08 PM  Result Value Ref Range   Sodium 141 135 - 145 mEq/L   Potassium 4.3 3.5 - 5.1 mEq/L   Chloride 107 96 - 112 mEq/L   CO2 32 19 - 32 mEq/L   Glucose, Bld 93 70 - 99 mg/dL   BUN 11 6 - 23 mg/dL   Creatinine, Ser 9.06 0.40 - 1.20 mg/dL   Total Bilirubin 0.5 0.2 - 1.2 mg/dL   Alkaline Phosphatase 72 39 - 117 U/L   AST 20 5 - 37 U/L   ALT 22 3 - 35 U/L    Total Protein 7.3 6.0 - 8.3 g/dL   Albumin 4.3 3.5 - 5.2 g/dL   GFR 28.97 >39.99 mL/min   Calcium  9.7 8.4 - 10.5 mg/dL    Assessment and Plan  Contusion of multiple sites of left lower extremity, initial encounter Assessment & Plan:  Acute, likely secondary to  anticoagulation on plavix  and possibly recent additional or multiple course of prednisone .  Will evaluate for additional cause of bleeding... no red flags.    Orders: -     CBC with Differential/Platelet -     Protime-INR -     Comprehensive metabolic panel with GFR    No follow-ups on file.   Greig Ring, MD  "

## 2024-07-21 ENCOUNTER — Ambulatory Visit
